# Patient Record
Sex: Male | Born: 1980 | Race: White | Hispanic: No | Marital: Single | Smoking: Current some day smoker
Health system: Southern US, Community
[De-identification: ages and names within clinical notes are randomized; demographics above are authoritative.]

## PROBLEM LIST (undated history)

## (undated) DIAGNOSIS — K7460 Unspecified cirrhosis of liver: Secondary | ICD-10-CM

## (undated) DIAGNOSIS — N179 Acute kidney failure, unspecified: Secondary | ICD-10-CM

## (undated) DIAGNOSIS — R011 Cardiac murmur, unspecified: Secondary | ICD-10-CM

## (undated) DIAGNOSIS — F1020 Alcohol dependence, uncomplicated: Secondary | ICD-10-CM

## (undated) DIAGNOSIS — F32A Depression, unspecified: Secondary | ICD-10-CM

## (undated) DIAGNOSIS — F1910 Other psychoactive substance abuse, uncomplicated: Secondary | ICD-10-CM

## (undated) DIAGNOSIS — R000 Tachycardia, unspecified: Secondary | ICD-10-CM

## (undated) DIAGNOSIS — F329 Major depressive disorder, single episode, unspecified: Secondary | ICD-10-CM

## (undated) DIAGNOSIS — K759 Inflammatory liver disease, unspecified: Secondary | ICD-10-CM

## (undated) DIAGNOSIS — F419 Anxiety disorder, unspecified: Secondary | ICD-10-CM

## (undated) DIAGNOSIS — I10 Essential (primary) hypertension: Secondary | ICD-10-CM

## (undated) DIAGNOSIS — D649 Anemia, unspecified: Secondary | ICD-10-CM

## (undated) HISTORY — DX: Depression, unspecified: F32.A

## (undated) HISTORY — DX: Major depressive disorder, single episode, unspecified: F32.9

## (undated) HISTORY — DX: Anxiety disorder, unspecified: F41.9

## (undated) HISTORY — PX: OTHER SURGICAL HISTORY: SHX169

## (undated) HISTORY — DX: Other psychoactive substance abuse, uncomplicated: F19.10

## (undated) HISTORY — DX: Cardiac murmur, unspecified: R01.1

## (undated) HISTORY — PX: COLONOSCOPY: SHX174

## (undated) HISTORY — DX: Anemia, unspecified: D64.9

## (undated) HISTORY — PX: FISSURECTOMY: SHX5244

## (undated) HISTORY — DX: Essential (primary) hypertension: I10

---

## 2012-01-02 ENCOUNTER — Emergency Department: Payer: Self-pay | Admitting: Emergency Medicine

## 2012-01-02 LAB — BASIC METABOLIC PANEL
Anion Gap: 12 (ref 7–16)
BUN: 9 mg/dL (ref 7–18)
Calcium, Total: 9.9 mg/dL (ref 8.5–10.1)
Co2: 29 mmol/L (ref 21–32)
Creatinine: 0.59 mg/dL — ABNORMAL LOW (ref 0.60–1.30)
EGFR (African American): >60
Osmolality: 265 (ref 275–301)
Sodium: 132 mmol/L — ABNORMAL LOW (ref 136–145)

## 2012-01-02 LAB — CBC
HCT: 50.1 % (ref 40.0–52.0)
MCH: 33.7 pg (ref 26.0–34.0)
MCHC: 34.6 g/dL (ref 32.0–36.0)
RBC: 5.14 10*6/uL (ref 4.40–5.90)
RDW: 13.8 % (ref 11.5–14.5)

## 2012-01-02 LAB — TROPONIN I: Troponin-I: <0.02 ng/mL

## 2013-02-01 ENCOUNTER — Inpatient Hospital Stay: Payer: Self-pay | Admitting: Specialist

## 2013-02-01 LAB — COMPREHENSIVE METABOLIC PANEL
Albumin: 3.1 g/dL — ABNORMAL LOW (ref 3.4–5.0)
Alkaline Phosphatase: 245 U/L — ABNORMAL HIGH (ref 50–136)
Anion Gap: 7 (ref 7–16)
BUN: 3 mg/dL — ABNORMAL LOW (ref 7–18)
Bilirubin,Total: 8.8 mg/dL — ABNORMAL HIGH (ref 0.2–1.0)
Calcium, Total: 8 mg/dL — ABNORMAL LOW (ref 8.5–10.1)
Co2: 31 mmol/L (ref 21–32)
EGFR (African American): >60
EGFR (Non-African Amer.): >60
Glucose: 152 mg/dL — ABNORMAL HIGH (ref 65–99)
Osmolality: 270 (ref 275–301)
Potassium: 3 mmol/L — ABNORMAL LOW (ref 3.5–5.1)
SGPT (ALT): 76 U/L (ref 12–78)
Sodium: 135 mmol/L — ABNORMAL LOW (ref 136–145)

## 2013-02-01 LAB — URINALYSIS, COMPLETE
Ketone: NEGATIVE
Leukocyte Esterase: NEGATIVE
Nitrite: NEGATIVE
Specific Gravity: 1.015 (ref 1.003–1.030)
WBC UR: 5 /HPF (ref 0–5)

## 2013-02-01 LAB — ETHANOL
Ethanol %: 0.427 % (ref 0.000–0.080)
Ethanol: 427 mg/dL

## 2013-02-01 LAB — CBC
HCT: 34 % — ABNORMAL LOW (ref 40.0–52.0)
MCHC: 35.2 g/dL (ref 32.0–36.0)
Platelet: 186 10*3/uL (ref 150–440)
RBC: 3.23 10*6/uL — ABNORMAL LOW (ref 4.40–5.90)
RDW: 17.8 % — ABNORMAL HIGH (ref 11.5–14.5)

## 2013-02-01 LAB — PROTIME-INR
INR: 1.3
Prothrombin Time: 16 secs — ABNORMAL HIGH (ref 11.5–14.7)

## 2013-02-01 LAB — APTT: Activated PTT: 40.8 secs — ABNORMAL HIGH (ref 23.6–35.9)

## 2013-02-01 LAB — TROPONIN I: Troponin-I: <0.02 ng/mL

## 2013-02-01 LAB — MAGNESIUM: Magnesium: 1.5 mg/dL — ABNORMAL LOW

## 2013-02-01 LAB — ACETAMINOPHEN LEVEL: Acetaminophen: <2 ug/mL

## 2013-02-02 LAB — CBC WITH DIFFERENTIAL/PLATELET
HGB: 10 g/dL — ABNORMAL LOW (ref 13.0–18.0)
Lymphocyte %: 15.8 %
MCH: 37.5 pg — ABNORMAL HIGH (ref 26.0–34.0)
MCHC: 35.5 g/dL (ref 32.0–36.0)
Monocyte #: 1.3 x10 3/mm — ABNORMAL HIGH (ref 0.2–1.0)
Monocyte %: 13.7 %
Neutrophil %: 69.3 %
WBC: 9.5 10*3/uL (ref 3.8–10.6)

## 2013-02-02 LAB — COMPREHENSIVE METABOLIC PANEL
Albumin: 2.6 g/dL — ABNORMAL LOW (ref 3.4–5.0)
Alkaline Phosphatase: 194 U/L — ABNORMAL HIGH (ref 50–136)
Anion Gap: 10 (ref 7–16)
Calcium, Total: 7.2 mg/dL — ABNORMAL LOW (ref 8.5–10.1)
Chloride: 98 mmol/L (ref 98–107)
Co2: 27 mmol/L (ref 21–32)
Creatinine: 0.41 mg/dL — ABNORMAL LOW (ref 0.60–1.30)
EGFR (African American): >60
Glucose: 146 mg/dL — ABNORMAL HIGH (ref 65–99)
Potassium: 2.9 mmol/L — ABNORMAL LOW (ref 3.5–5.1)
SGOT(AST): 266 U/L — ABNORMAL HIGH (ref 15–37)
SGPT (ALT): 63 U/L (ref 12–78)
Sodium: 135 mmol/L — ABNORMAL LOW (ref 136–145)

## 2013-02-02 LAB — TROPONIN I
Troponin-I: <0.02 ng/mL
Troponin-I: <0.02 ng/mL

## 2013-02-02 LAB — POTASSIUM: Potassium: 3.4 mmol/L — ABNORMAL LOW (ref 3.5–5.1)

## 2013-02-02 LAB — MAGNESIUM: Magnesium: 1 mg/dL — ABNORMAL LOW

## 2013-02-02 LAB — HEMOGLOBIN: HGB: 10.7 g/dL — ABNORMAL LOW (ref 13.0–18.0)

## 2013-02-03 LAB — CBC WITH DIFFERENTIAL/PLATELET
Basophil %: 0.7 %
Eosinophil #: 0.1 10*3/uL (ref 0.0–0.7)
Eosinophil %: 0.6 %
HCT: 29.2 % — ABNORMAL LOW (ref 40.0–52.0)
HGB: 10.2 g/dL — ABNORMAL LOW (ref 13.0–18.0)
Lymphocyte #: 1.1 10*3/uL (ref 1.0–3.6)
Lymphocyte %: 10.1 %
MCHC: 35 g/dL (ref 32.0–36.0)
MCV: 106 fL — ABNORMAL HIGH (ref 80–100)
Monocyte #: 1.4 x10 3/mm — ABNORMAL HIGH (ref 0.2–1.0)
Monocyte %: 12.1 %
Neutrophil #: 8.5 10*3/uL — ABNORMAL HIGH (ref 1.4–6.5)
RDW: 17.8 % — ABNORMAL HIGH (ref 11.5–14.5)
WBC: 11.1 10*3/uL — ABNORMAL HIGH (ref 3.8–10.6)

## 2013-02-03 LAB — CLOSTRIDIUM DIFFICILE BY PCR

## 2013-02-03 LAB — COMPREHENSIVE METABOLIC PANEL
Albumin: 2.5 g/dL — ABNORMAL LOW (ref 3.4–5.0)
Alkaline Phosphatase: 169 U/L — ABNORMAL HIGH (ref 50–136)
Calcium, Total: 7.1 mg/dL — ABNORMAL LOW (ref 8.5–10.1)
Chloride: 99 mmol/L (ref 98–107)
EGFR (African American): >60
EGFR (Non-African Amer.): >60
Glucose: 139 mg/dL — ABNORMAL HIGH (ref 65–99)
Osmolality: 261 (ref 275–301)
SGPT (ALT): 52 U/L (ref 12–78)

## 2013-02-03 LAB — AMMONIA: Ammonia, Plasma: 91 mcmol/L — ABNORMAL HIGH (ref 11–32)

## 2013-02-03 LAB — HEMOGLOBIN A1C: Hemoglobin A1C: <3.5 % — ABNORMAL LOW (ref 4.2–6.3)

## 2013-02-03 LAB — URINE CULTURE

## 2013-02-04 LAB — HEMOGLOBIN: HGB: 10.6 g/dL — ABNORMAL LOW (ref 13.0–18.0)

## 2013-02-04 LAB — BASIC METABOLIC PANEL
Calcium, Total: 7.2 mg/dL — ABNORMAL LOW (ref 8.5–10.1)
Creatinine: 0.55 mg/dL — ABNORMAL LOW (ref 0.60–1.30)
EGFR (Non-African Amer.): >60
Glucose: 134 mg/dL — ABNORMAL HIGH (ref 65–99)

## 2013-02-04 LAB — SODIUM: Sodium: 134 mmol/L — ABNORMAL LOW (ref 136–145)

## 2013-02-05 LAB — HEPATIC FUNCTION PANEL A (ARMC)
Albumin: 2.5 g/dL — ABNORMAL LOW (ref 3.4–5.0)
Alkaline Phosphatase: 151 U/L — ABNORMAL HIGH (ref 50–136)
Bilirubin, Direct: 12.8 mg/dL — ABNORMAL HIGH (ref 0.00–0.20)
Bilirubin,Total: 16.4 mg/dL — ABNORMAL HIGH (ref 0.2–1.0)
SGOT(AST): 143 U/L — ABNORMAL HIGH (ref 15–37)
SGPT (ALT): 42 U/L (ref 12–78)
Total Protein: 6.5 g/dL (ref 6.4–8.2)

## 2013-02-05 LAB — AMMONIA: Ammonia, Plasma: 57 mcmol/L — ABNORMAL HIGH (ref 11–32)

## 2013-02-05 LAB — PROTIME-INR: Prothrombin Time: 18.4 secs — ABNORMAL HIGH (ref 11.5–14.7)

## 2013-02-05 LAB — HEMOGLOBIN: HGB: 10.7 g/dL — ABNORMAL LOW (ref 13.0–18.0)

## 2013-02-05 LAB — SODIUM: Sodium: 134 mmol/L — ABNORMAL LOW (ref 136–145)

## 2013-02-05 LAB — STOOL CULTURE

## 2013-02-06 LAB — HEPATIC FUNCTION PANEL A (ARMC)
Albumin: 2.3 g/dL — ABNORMAL LOW (ref 3.4–5.0)
Alkaline Phosphatase: 143 U/L — ABNORMAL HIGH (ref 50–136)
Bilirubin, Direct: 12.4 mg/dL — ABNORMAL HIGH (ref 0.00–0.20)
SGOT(AST): 116 U/L — ABNORMAL HIGH (ref 15–37)

## 2013-02-06 LAB — SODIUM: Sodium: 134 mmol/L — ABNORMAL LOW (ref 136–145)

## 2013-02-07 LAB — HEMOGLOBIN: HGB: 10.4 g/dL — ABNORMAL LOW (ref 13.0–18.0)

## 2013-02-07 LAB — COMPREHENSIVE METABOLIC PANEL
Albumin: 2.5 g/dL — ABNORMAL LOW (ref 3.4–5.0)
Alkaline Phosphatase: 172 U/L — ABNORMAL HIGH (ref 50–136)
BUN: 11 mg/dL (ref 7–18)
Bilirubin,Total: 12.7 mg/dL — ABNORMAL HIGH (ref 0.2–1.0)
Chloride: 103 mmol/L (ref 98–107)
Creatinine: 0.43 mg/dL — ABNORMAL LOW (ref 0.60–1.30)
EGFR (African American): >60
EGFR (Non-African Amer.): >60
Glucose: 129 mg/dL — ABNORMAL HIGH (ref 65–99)
SGOT(AST): 157 U/L — ABNORMAL HIGH (ref 15–37)
SGPT (ALT): 48 U/L (ref 12–78)
Sodium: 134 mmol/L — ABNORMAL LOW (ref 136–145)

## 2013-02-07 LAB — PROTIME-INR
INR: 1.4
Prothrombin Time: 17.6 secs — ABNORMAL HIGH (ref 11.5–14.7)

## 2013-02-07 LAB — MAGNESIUM: Magnesium: 2.6 mg/dL — ABNORMAL HIGH

## 2013-02-08 LAB — CBC WITH DIFFERENTIAL/PLATELET
Basophil #: 0.1 10*3/uL (ref 0.0–0.1)
Basophil %: 0.4 %
Eosinophil #: 0 10*3/uL (ref 0.0–0.7)
Eosinophil %: 0.1 %
HCT: 29.8 % — ABNORMAL LOW (ref 40.0–52.0)
HGB: 10.2 g/dL — ABNORMAL LOW (ref 13.0–18.0)
Lymphocyte #: 1.2 10*3/uL (ref 1.0–3.6)
Lymphocyte %: 9.2 %
MCH: 37.6 pg — ABNORMAL HIGH (ref 26.0–34.0)
MCHC: 34.2 g/dL (ref 32.0–36.0)
MCV: 110 fL — ABNORMAL HIGH (ref 80–100)
Monocyte #: 1.5 x10 3/mm — ABNORMAL HIGH (ref 0.2–1.0)
Monocyte %: 11.6 %
Platelet: 205 10*3/uL (ref 150–440)
RBC: 2.71 10*6/uL — ABNORMAL LOW (ref 4.40–5.90)
RDW: 18.2 % — ABNORMAL HIGH (ref 11.5–14.5)

## 2013-02-08 LAB — COMPREHENSIVE METABOLIC PANEL
Albumin: 2.4 g/dL — ABNORMAL LOW (ref 3.4–5.0)
Alkaline Phosphatase: 151 U/L — ABNORMAL HIGH (ref 50–136)
Anion Gap: 6 — ABNORMAL LOW (ref 7–16)
BUN: 12 mg/dL (ref 7–18)
Bilirubin,Total: 9.2 mg/dL — ABNORMAL HIGH (ref 0.2–1.0)
Chloride: 104 mmol/L (ref 98–107)
Co2: 26 mmol/L (ref 21–32)
Creatinine: 0.59 mg/dL — ABNORMAL LOW (ref 0.60–1.30)
EGFR (African American): >60
Glucose: 122 mg/dL — ABNORMAL HIGH (ref 65–99)
SGPT (ALT): 49 U/L (ref 12–78)
Total Protein: 6.4 g/dL (ref 6.4–8.2)

## 2013-02-09 LAB — CBC WITH DIFFERENTIAL/PLATELET
Basophil #: 0.2 10*3/uL — ABNORMAL HIGH (ref 0.0–0.1)
MCH: 38.2 pg — ABNORMAL HIGH (ref 26.0–34.0)
MCHC: 34.9 g/dL (ref 32.0–36.0)
MCV: 110 fL — ABNORMAL HIGH (ref 80–100)
Monocyte #: 1.9 x10 3/mm — ABNORMAL HIGH (ref 0.2–1.0)
Monocyte %: 13.1 %
Neutrophil #: 10.6 10*3/uL — ABNORMAL HIGH (ref 1.4–6.5)
Neutrophil %: 73.6 %
Platelet: 214 10*3/uL (ref 150–440)
RBC: 2.82 10*6/uL — ABNORMAL LOW (ref 4.40–5.90)
RDW: 17.7 % — ABNORMAL HIGH (ref 11.5–14.5)

## 2013-02-09 LAB — COMPREHENSIVE METABOLIC PANEL
Alkaline Phosphatase: 145 U/L — ABNORMAL HIGH (ref 50–136)
Anion Gap: 5 — ABNORMAL LOW (ref 7–16)
Chloride: 104 mmol/L (ref 98–107)
Co2: 26 mmol/L (ref 21–32)
Creatinine: 0.49 mg/dL — ABNORMAL LOW (ref 0.60–1.30)
EGFR (Non-African Amer.): >60
Glucose: 85 mg/dL (ref 65–99)
Potassium: 4.6 mmol/L (ref 3.5–5.1)
SGPT (ALT): 53 U/L (ref 12–78)
Sodium: 135 mmol/L — ABNORMAL LOW (ref 136–145)
Total Protein: 6.5 g/dL (ref 6.4–8.2)

## 2013-02-10 LAB — COMPREHENSIVE METABOLIC PANEL
Albumin: 2.4 g/dL — ABNORMAL LOW (ref 3.4–5.0)
Alkaline Phosphatase: 141 U/L — ABNORMAL HIGH (ref 50–136)
Anion Gap: 4 — ABNORMAL LOW (ref 7–16)
BUN: 8 mg/dL (ref 7–18)
Bilirubin,Total: 10.4 mg/dL — ABNORMAL HIGH (ref 0.2–1.0)
Calcium, Total: 8.4 mg/dL — ABNORMAL LOW (ref 8.5–10.1)
Chloride: 103 mmol/L (ref 98–107)
EGFR (African American): >60
EGFR (Non-African Amer.): >60
Glucose: 92 mg/dL (ref 65–99)
Potassium: 4.2 mmol/L (ref 3.5–5.1)
SGOT(AST): 139 U/L — ABNORMAL HIGH (ref 15–37)
SGPT (ALT): 50 U/L (ref 12–78)
Total Protein: 6.5 g/dL (ref 6.4–8.2)

## 2013-02-10 LAB — MAGNESIUM: Magnesium: 1.4 mg/dL — ABNORMAL LOW

## 2013-02-10 LAB — CBC WITH DIFFERENTIAL/PLATELET
Basophil #: 0.1 10*3/uL (ref 0.0–0.1)
Basophil %: 0.7 %
Eosinophil #: 0.2 10*3/uL (ref 0.0–0.7)
Eosinophil %: 1.1 %
HGB: 10.6 g/dL — ABNORMAL LOW (ref 13.0–18.0)
MCH: 38.5 pg — ABNORMAL HIGH (ref 26.0–34.0)
Monocyte %: 12.1 %
Neutrophil #: 10.2 10*3/uL — ABNORMAL HIGH (ref 1.4–6.5)
Neutrophil %: 73.9 %
Platelet: 200 10*3/uL (ref 150–440)
RDW: 17.3 % — ABNORMAL HIGH (ref 11.5–14.5)
WBC: 13.8 10*3/uL — ABNORMAL HIGH (ref 3.8–10.6)

## 2013-02-11 LAB — CBC WITH DIFFERENTIAL/PLATELET
Basophil %: 0.5 %
Eosinophil #: 0 10*3/uL (ref 0.0–0.7)
HCT: 29.2 % — ABNORMAL LOW (ref 40.0–52.0)
HGB: 10.2 g/dL — ABNORMAL LOW (ref 13.0–18.0)
MCHC: 34.8 g/dL (ref 32.0–36.0)
MCV: 110 fL — ABNORMAL HIGH (ref 80–100)
Monocyte %: 6.4 %
Neutrophil #: 10.4 10*3/uL — ABNORMAL HIGH (ref 1.4–6.5)
Neutrophil %: 84.6 %
Platelet: 190 10*3/uL (ref 150–440)
RBC: 2.65 10*6/uL — ABNORMAL LOW (ref 4.40–5.90)
WBC: 12.3 10*3/uL — ABNORMAL HIGH (ref 3.8–10.6)

## 2013-02-11 LAB — COMPREHENSIVE METABOLIC PANEL
Alkaline Phosphatase: 129 U/L (ref 50–136)
Anion Gap: 5 — ABNORMAL LOW (ref 7–16)
Bilirubin,Total: 9.2 mg/dL — ABNORMAL HIGH (ref 0.2–1.0)
Calcium, Total: 8 mg/dL — ABNORMAL LOW (ref 8.5–10.1)
Co2: 28 mmol/L (ref 21–32)
Glucose: 165 mg/dL — ABNORMAL HIGH (ref 65–99)
Potassium: 4.7 mmol/L (ref 3.5–5.1)
SGPT (ALT): 44 U/L (ref 12–78)
Sodium: 137 mmol/L (ref 136–145)
Total Protein: 6.1 g/dL — ABNORMAL LOW (ref 6.4–8.2)

## 2013-02-11 LAB — CLOSTRIDIUM DIFFICILE BY PCR

## 2013-02-12 LAB — BASIC METABOLIC PANEL
Anion Gap: 6 — ABNORMAL LOW (ref 7–16)
BUN: 16 mg/dL (ref 7–18)
Calcium, Total: 8.3 mg/dL — ABNORMAL LOW (ref 8.5–10.1)
Chloride: 105 mmol/L (ref 98–107)
Co2: 27 mmol/L (ref 21–32)
Creatinine: 0.51 mg/dL — ABNORMAL LOW (ref 0.60–1.30)
EGFR (African American): >60
EGFR (Non-African Amer.): >60
Sodium: 138 mmol/L (ref 136–145)

## 2013-09-13 ENCOUNTER — Ambulatory Visit: Payer: Self-pay | Admitting: Internal Medicine

## 2013-09-20 ENCOUNTER — Ambulatory Visit: Payer: Self-pay | Admitting: Internal Medicine

## 2013-09-20 LAB — CBC CANCER CENTER
Basophil #: 0 x10 3/mm (ref 0.0–0.1)
Basophil %: 0.5 %
Eosinophil #: 0.1 x10 3/mm (ref 0.0–0.7)
Eosinophil %: 1.7 %
HCT: 36.8 % — AB (ref 40.0–52.0)
HGB: 12.1 g/dL — AB (ref 13.0–18.0)
LYMPHS PCT: 23.2 %
Lymphocyte #: 1.2 x10 3/mm (ref 1.0–3.6)
MCH: 30.1 pg (ref 26.0–34.0)
MCHC: 32.8 g/dL (ref 32.0–36.0)
MCV: 92 fL (ref 80–100)
Monocyte #: 0.5 x10 3/mm (ref 0.2–1.0)
Monocyte %: 9 %
NEUTROS PCT: 65.6 %
Neutrophil #: 3.5 x10 3/mm (ref 1.4–6.5)
Platelet: 97 x10 3/mm — ABNORMAL LOW (ref 150–440)
RBC: 4.01 10*6/uL — AB (ref 4.40–5.90)
RDW: 14 % (ref 11.5–14.5)
WBC: 5.3 x10 3/mm (ref 3.8–10.6)

## 2013-10-06 ENCOUNTER — Ambulatory Visit: Payer: Self-pay | Admitting: Internal Medicine

## 2014-05-04 ENCOUNTER — Emergency Department: Payer: Self-pay | Admitting: Emergency Medicine

## 2014-07-31 IMAGING — US ABDOMEN ULTRASOUND
1 series · 13 of 25 positions shown · non-contrast
Comparison: none

REASON FOR EXAM: upper abd painb, distention, jauntice, rectal bleeding
COMMENTS:   May transport without cardiac monitor

[Series 1: abdomen ultrasound · 0.40mm/px · 13 of 110 slices shown]
[im 1/110]
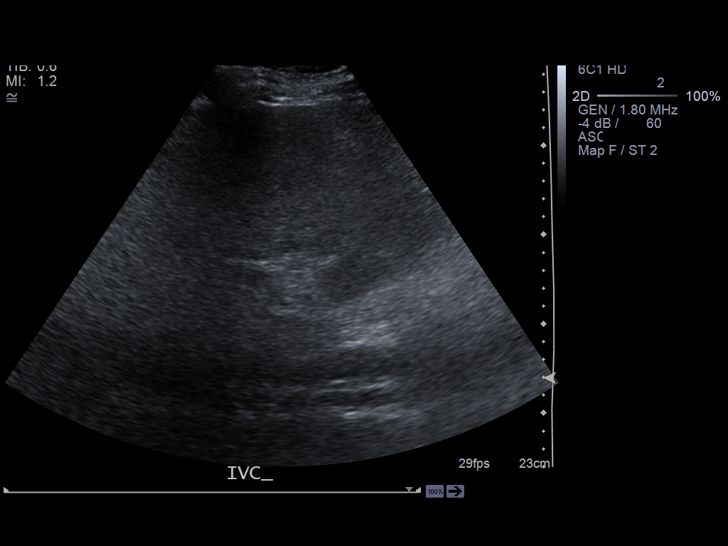
[im 10/110]
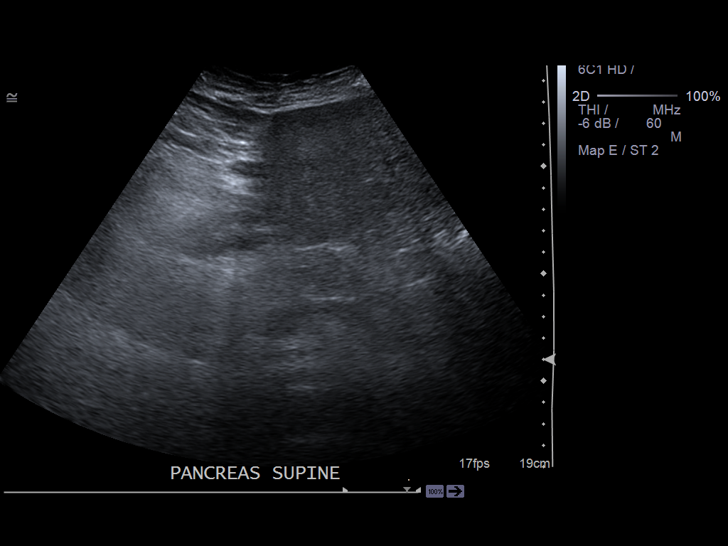
[im 19/110]
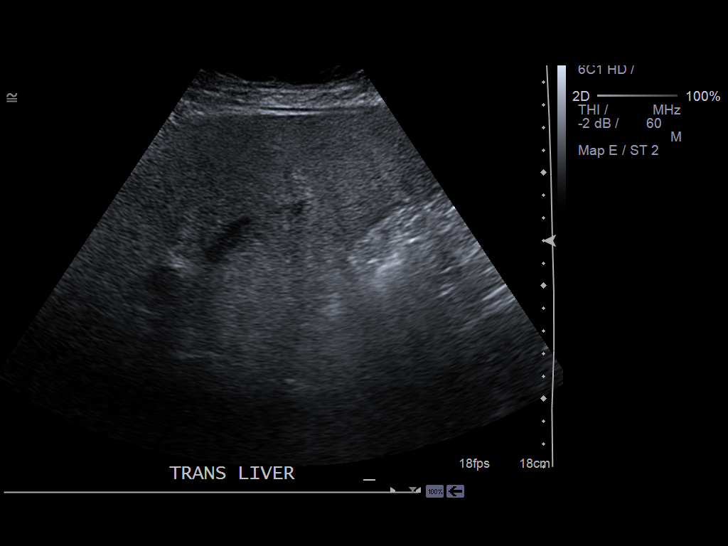
[im 28/110]
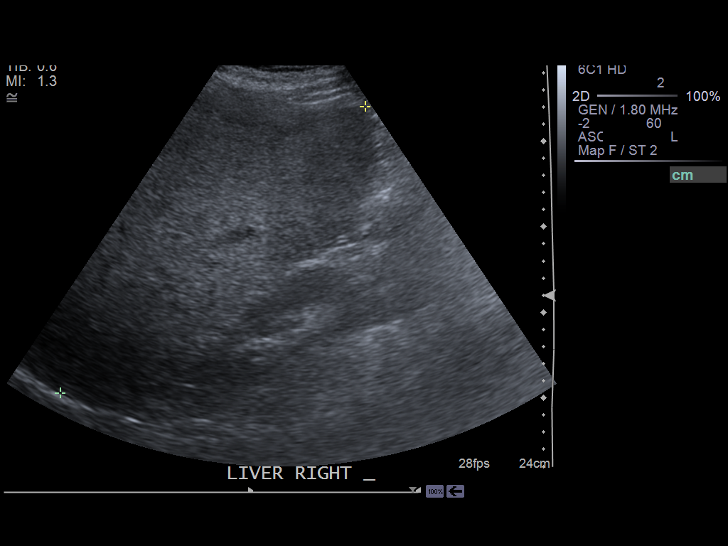
[im 37/110]
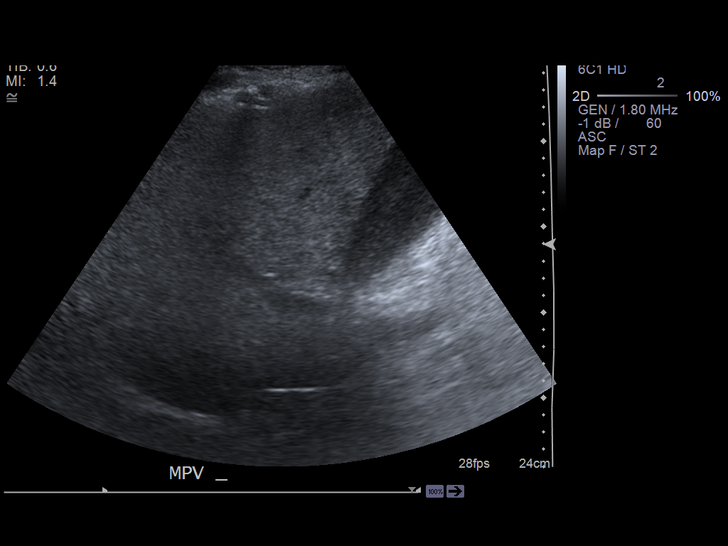
[im 46/110]
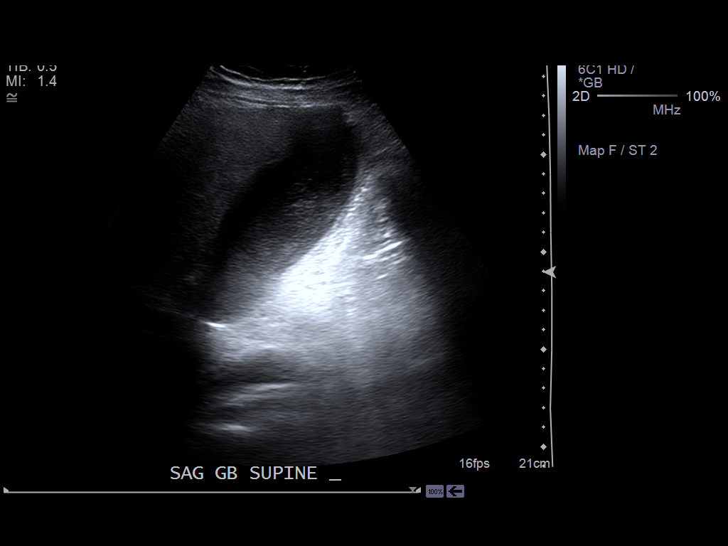
[im 55/110]
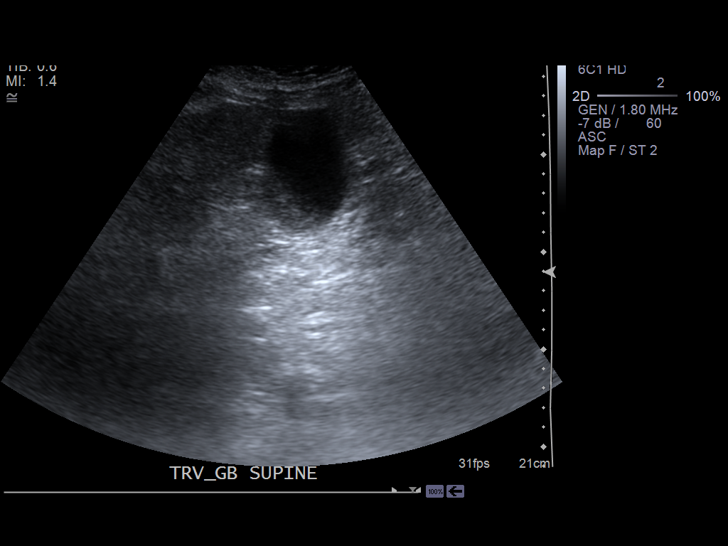
[im 64/110]
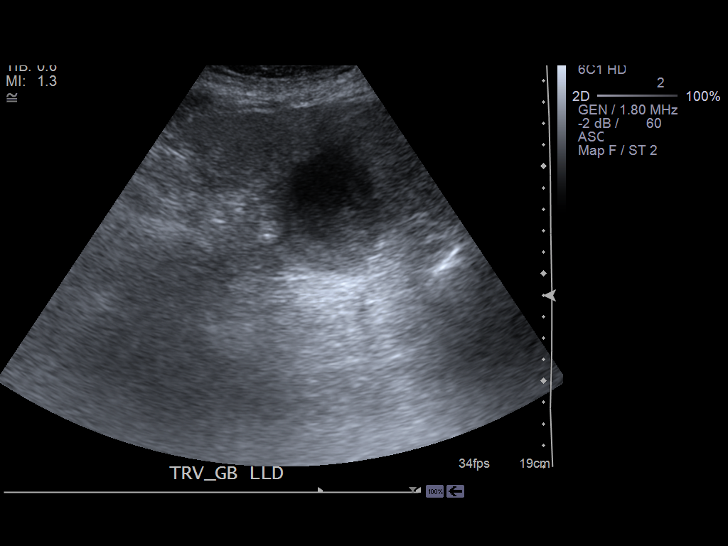
[im 73/110]
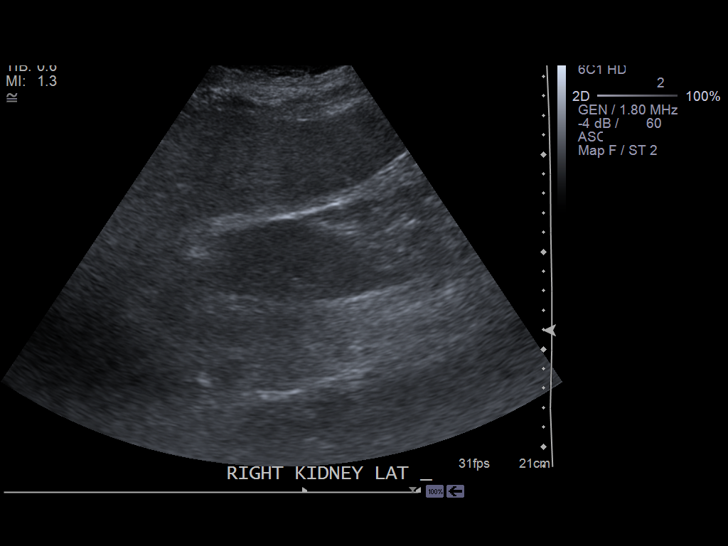
[im 82/110]
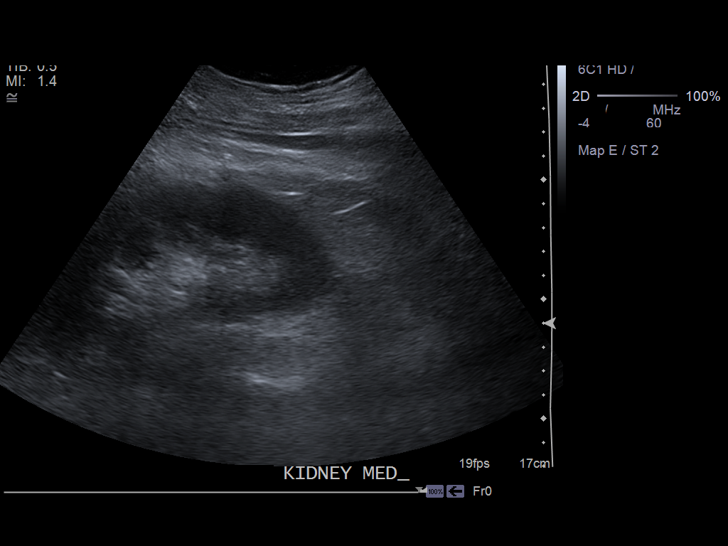
[im 91/110]
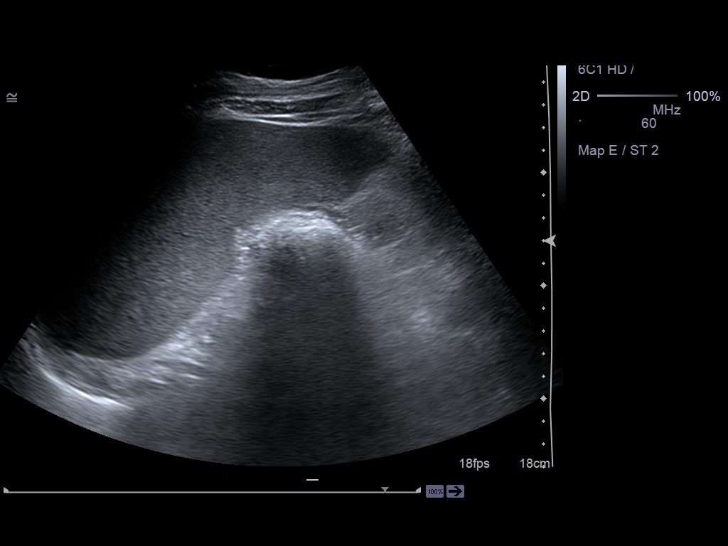
[im 100/110]
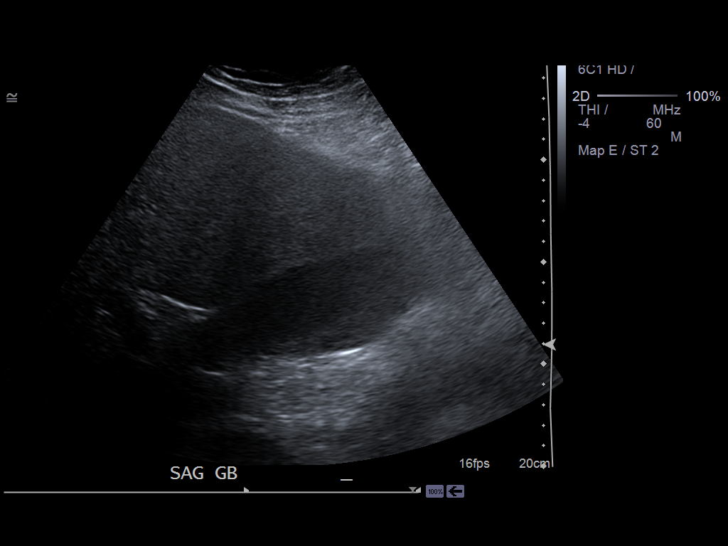
[im 110/110]
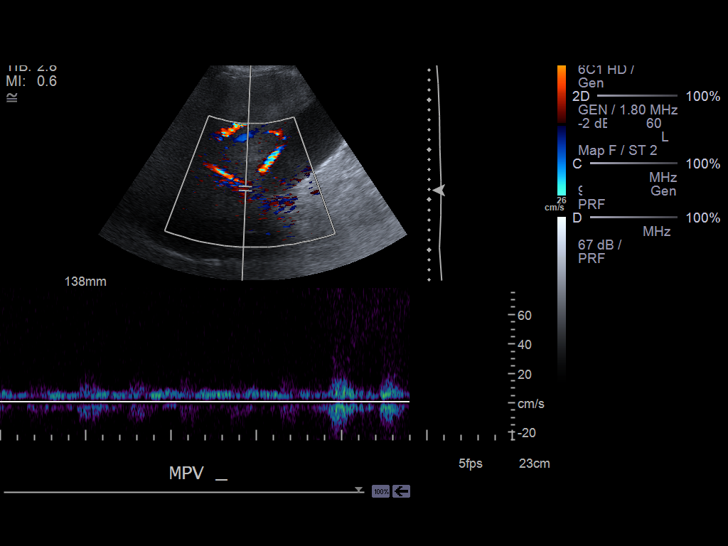

[13 of 25 positions shown; findings below may reference images not displayed]

PROCEDURE:     US  - US ABDOMEN GENERAL SURVEY  - February 01, 2013  [DATE]

RESULT:     Abdominal sonogram shows the pancreas and aorta are
predominantly and secured by overlying bowel gas and are poorly seen. The
liver shows a somewhat heterogeneous pattern of echotexture with mildly
increased echogenicity and is enlarged to 24.44 cm in length in the mid
axillary line. There is no intrahepatic biliary ductal dilation. Portal
venous flow appears to be grossly normal. The common bile duct diameter is
normal at 4.2 mm. The gallbladder contains some echogenic sludge with a
negative sonographic Murphy's sign the gallbladder wall thickness near the
upper limits of normal at 2.7 mm. The right kidney measures 13.05 x 4.73 x
5.19 cm. The left kidney is 12.77 x 6.18 x 8.19 cm. Neither kidney shows a
solid or cystic mass, obstructive change or nephrolithiasis. The spleen is
enlarged to 16.67 cm. No definite ascites is identified. No abnormal fluid
collection is evident.
IMPRESSION: 1. Hepatosplenomegaly.
2. Poor visualization of the pancreas and abdominal aorta. The inferior vena
cava is also not well seen. The there is sludge present in the gallbladder
with a negative sonographic Murphy's sign. The gallbladder wall thickness is
at the upper limits of normal.

[REDACTED]

## 2014-12-26 NOTE — Consult Note (Signed)
Chief Complaint:  Subjective/Chief Complaint Still with diarrhea and dyspepsia/nausea. Diet advanced to full liquids.   VITAL SIGNS/ANCILLARY NOTES: **Vital Signs.:   02-Jun-14 10:06  Vital Signs Type Q 4hr  Temperature Temperature (F) 99.4  Celsius 37.4  Temperature Source oral  Pulse Pulse 96  Respirations Respirations 18  Systolic BP Systolic BP 95  Diastolic BP (mmHg) Diastolic BP (mmHg) 58  Mean BP 70  Pulse Ox % Pulse Ox % 93  Pulse Ox Activity Level  At rest  Oxygen Delivery 2L   Brief Assessment:  GEN no acute distress   Cardiac Regular   Respiratory clear BS   Gastrointestinal Normal   Lab Results: Routine Chem:  02-Jun-14 09:01   Glucose, Serum  134  BUN  2  Creatinine (comp)  0.55  Sodium, Serum  133  Potassium, Serum 3.6  Chloride, Serum 101  CO2, Serum 27  Calcium (Total), Serum  7.2  Anion Gap  5  Osmolality (calc) 265  eGFR (African American) >60  eGFR (Non-African American) >60 (eGFR values <96m/min/1.73 m2 may be an indication of chronic kidney disease (CKD). Calculated eGFR is useful in patients with stable renal function. The eGFR calculation will not be reliable in acutely ill patients when serum creatinine is changing rapidly. It is not useful in  patients on dialysis. The eGFR calculation may not be applicable to patients at the low and high extremes of body sizes, pregnant women, and vegetarians.)   Assessment/Plan:  Assessment/Plan:  Assessment C.diff. Still symptomatic.   Plan No active bleeding. No signs of DT. Continue daily PPI and nadolol. Continue flagyl. Once c.diff sxs are under control, patient can be discharged to home with f/u in GI. thanks.   Electronic Signatures: OVerdie Shire(MD)  (Signed 02-Jun-14 13:07)  Authored: Chief Complaint, VITAL SIGNS/ANCILLARY NOTES, Brief Assessment, Lab Results, Assessment/Plan   Last Updated: 02-Jun-14 13:07 by OVerdie Shire(MD)

## 2014-12-26 NOTE — Consult Note (Signed)
Pt with CC of severe alcoholism, drinking a fifth of vodka a day, has alcohol pancreatitis, hepatitis.  Given these problems he has signif abd and lower extremity edema out of proportion to his albumin.  Wonder about obstruction to lymphphatics and would recommend CT of abd before discharge.  Echo cardiogram to check for alcoholic cardiomyopathy may be helpful even though he is pretty young.    Electronic Signatures: Scot JunElliott, Robert T (MD)  (Signed on 07-Jun-14 13:00)  Authored  Last Updated: 07-Jun-14 13:00 by Scot JunElliott, Robert T (MD)

## 2014-12-26 NOTE — Consult Note (Signed)
Chief Complaint:  Subjective/Chief Complaint Events of the weekend noted. Less dyspepsia, diarrhea, and rectal bleeding. Leg and stomach pain from edema. CT neg except for hepatosplenomegaly.   VITAL SIGNS/ANCILLARY NOTES: **Vital Signs.:   09-Jun-14 10:11  Vital Signs Type Q 4hr  Temperature Temperature (F) 98.2  Celsius 36.7  Temperature Source oral  Pulse Pulse 78  Respirations Respirations 18  Systolic BP Systolic BP 161  Diastolic BP (mmHg) Diastolic BP (mmHg) 77  Mean BP 91  Pulse Ox % Pulse Ox % 96  Pulse Ox Activity Level  At rest  Oxygen Delivery Room Air/ 21 %   Brief Assessment:  GEN no acute distress   Cardiac Regular   Respiratory clear BS   Gastrointestinal Normal   Lab Results: Hepatic:  09-Jun-14 05:41   Bilirubin, Total  9.2  Alkaline Phosphatase 129  SGPT (ALT) 44  SGOT (AST)  105  Total Protein, Serum  6.1  Albumin, Serum  2.3  Routine Chem:  09-Jun-14 05:41   Magnesium, Serum 2.0 (1.8-2.4 THERAPEUTIC RANGE: 4-7 mg/dL TOXIC: > 10 mg/dL  -----------------------)  Glucose, Serum  165  BUN 9  Creatinine (comp)  0.54  Sodium, Serum 137  Potassium, Serum 4.7  Chloride, Serum 104  CO2, Serum 28  Calcium (Total), Serum  8.0  Osmolality (calc) 276  eGFR (African American) >60  eGFR (Non-African American) >60 (eGFR values <41m/min/1.73 m2 may be an indication of chronic kidney disease (CKD). Calculated eGFR is useful in patients with stable renal function. The eGFR calculation will not be reliable in acutely ill patients when serum creatinine is changing rapidly. It is not useful in  patients on dialysis. The eGFR calculation may not be applicable to patients at the low and high extremes of body sizes, pregnant women, and vegetarians.)  Anion Gap  5  Routine Hem:  09-Jun-14 05:41   WBC (CBC)  12.3  RBC (CBC)  2.65  Hemoglobin (CBC)  10.2  Hematocrit (CBC)  29.2  Platelet Count (CBC) 190  MCV  110  MCH  38.3  MCHC 34.8  RDW  16.9   Neutrophil % 84.6  Lymphocyte % 8.5  Monocyte % 6.4  Eosinophil % 0.0  Basophil % 0.5  Neutrophil #  10.4  Lymphocyte # 1.0  Monocyte # 0.8  Eosinophil # 0.0  Basophil # 0.1 (Result(s) reported on 11 Feb 2013 at 0[facility])   Radiology Results: CT:    08-Jun-14 13:16, CT Abdomen and Pelvis With Contrast  CT Abdomen and Pelvis With Contrast   REASON FOR EXAM:    (1) Lymph nodes- no need for oral contrast.; (2)   lymph nodes- chronic worsening  COMMENTS:       PROCEDURE: CT  - CT ABDOMEN / PELVIS  W  - Feb 10 2013  1:16PM     RESULT: History: Lymphadenopathy, GI bleed    Comparison:  None    Technique: Multiple axial images of the abdomen and pelvis were performed   from the lung bases to the pubic symphysis, without p.o. contrast and   with 100 ml of Isovue 370 intravenous contrast.    Findings:  The lung bases are clear. There is no pneumothorax. The heart size is   normal.     The liver is enlarged without focal abnormality. There is no intrahepatic   or extrahepatic biliary ductal dilatation. The gallbladder is   unremarkable. The spleen is enlarged without focal abnormality. The   kidneys, adrenal glands, and pancreas are normal.  The bladder is   unremarkable.     The unopacified stomach, duodenum, small intestine, and large intestine   demonstrate no gross abnormality, but evaluation is she limited secondary   to lack of enteric contrast. There is sigmoid diverticulosis without   evidence of diverticulitis. There is no pneumoperitoneum, pneumatosis, or   portal venous gas. There is a small amount of pelvic free fluid. There is   no lymphadenopathy. There is anasarca.  The abdominal aorta is normal in caliber .    The osseous structures are unremarkable.    IMPRESSION:     1. No evidence of abdominal or pelvic lymphadenopathy.    2. Hepatosplenomegaly.    Dictation Site: 1        Verified By: Jennette Banker, M.D., MD   Assessment/Plan:   Assessment/Plan:  Assessment Alcoholic hepatitis. C.diff.   Plan Continue to taper off prednisone over next few weeks. Recheck stool for c.diff. If neg, can prob stop by tomorrow.(tomorrow will be day #10 of flagyl) Contine xifaxan, pain meds prn. Daily diuretics. May need to increase dose further if no signif improvement. Hopefully, dishcharge soon. I will be at [facility] tomorrow. Will check back on Wed if patient still here. Thanks.   Electronic Signatures: Verdie Shire (MD)  (Signed 09-Jun-14 13:18)  Authored: Chief Complaint, VITAL SIGNS/ANCILLARY NOTES, Brief Assessment, Lab Results, Radiology Results, Assessment/Plan   Last Updated: 09-Jun-14 13:18 by Verdie Shire (MD)

## 2014-12-26 NOTE — H&P (Signed)
PATIENT NAME:  Derrick Blanchard, Derrick Blanchard MR#:  [id number] DATE OF BIRTH:  12/03/1980  DATE OF ADMISSION:  02/01/2013  REFERRING PHYSICIAN:  Dr. Dorothea GlassmanPaul Malinda.   REASON FOR ADMISSION:  Severe gastrointestinal bleeding and acute alcoholic hepatitis.   PRIMARY CARE PHYSICIAN:  None.   HISTORY OF PRESENT ILLNESS:  A 34 year old gentleman with history of severe alcoholism who comes with a history of intermittent GI bleeding per rectum that happens he says very often.   The patient at this moment is intoxicated with alcohol.  He is able to give some information.  His parents are there.  Apparently this morning and for the past 3 or 4 bowel movements he has had significant amount of red blood per rectum coming out.  He developed jaundice within the past three days with significant changes of coloration of his eyes and mucosa.    The patient has not been feeling very well.  He feels really weak, sick and continues to drink to alleviate all of the symptoms.  The family has asked him to come to the hospital on several occasions within the last week and he has refused up until today.    The patient states that he has been drinking about a fifth of alcohol a day and he has been drinking this heavily for the past two days.  He has gone into DTs, but he has never had seizures.  He is developing significant edema of the lower extremities and he has occasional tightness of his chest and shortness of breath due to this.  He has occasional nosebleeds.  The patient is evaluated in the ER.  His bilirubin is 8.8.  His previous hemoglobin on April of 2013, about a year ago, was 1917.  Tonight it is 11.9.    He has elevated white blood count.  He has significant increase of the AST and alkaline phosphatase and his alcohol level is 0.4.    The patient admitted for evaluation of these symptoms.  I had a long conversation with this patient about counseling with alcohol.  This is a serious condition that could kill him tonight and  he really needs to open his eyes and quit drinking.   REVIEW OF SYSTEMS:  A 12 system review of systems:  CONSTITUTIONAL:  The patient denies any fever, but he felt hot this morning.  He did not know if he was fever or not.  He feels fatigued.  He feels weak.  He had significant weight gain with fluid.   EYES:  Denies any blurry vision.  He just have changes of coloration with yellow sclerae.  EARS, NOSE, THROAT:  No difficulty swallowing.  No hearing loss.  No ear pain.  RESPIRATORY:  Occasional shortness of breath.  No cough.  No wheezing.  No COPD.  No hemoptysis.  CARDIOVASCULAR:  No chest pain, orthopnea.  Positive edema.  Negative syncope.  GASTROINTESTINAL:  Positive nausea.  No vomiting.  Negative diarrhea.  Positive bright red blood per rectum.  Negative melena.  Positive jaundice.  GENITOURINARY:  No dysuria, hematuria or changes in frequency.  No prostatitis.  ENDOCRINE:  No polyuria, polydipsia, polyphagia, cold or heat intolerance.  HEMATOLOGIC AND LYMPHATIC:  No history of anemia.  Positive easy bruising, positive GI bleeding.  No swollen glands.  SKIN:  Positive jaundice.  Positive telangiectasis on the belly.  MUSCULOSKELETAL:  No significant neck pain, back pain, shoulder pain or gout.  NEUROLOGIC:  No numbness, weakness, ataxia, CVA or TIA.  PSYCHIATRIC:  The patient might be depressed, but he states at this moment he just feels a little bit overwhelmed.  No anxiety or agitation.   PAST MEDICAL HISTORY:  Alcohol abuse.  In the past he has been told that he has elevated blood pressure, but never treated.   ALLERGIES:  Not known drug allergies.   PAST SURGICAL HISTORY:  No surgical history.   MEDICATIONS:  He does not take any medications.   SOCIAL HISTORY:  The patient is a heavy drinker of a fifth of alcohol a day for the past couple of years.  He has been drinking about daily.  He has history of marijuana use occasional, but he does not smoke tobacco.  He is unemployed.   He occasionally picks up a job here and there.  He lives with his parents.  He denies having any STDs.  No current sexual partners.  He cannot tell me how many he has had in his past.  One time three years ago he had use of hypodermic needle for IV drug abuse, but only once.   FAMILY HISTORY:  No MI.  Positive diabetes in his dad.  No cancer.   PHYSICAL EXAMINATION: VITAL SIGNS:  Blood pressure 129/70, pulse 111, respirations 24, temperature 98, oxygen saturation 95% on room air.   GENERAL:  The patient is alert.  He is oriented x 3.  No acute distress.  He looks very ill.  He is jaundiced.  He looks swollen all over, anasarca.  HEENT:  Pupils are equal and reactive.  Extraocular movements intact.  Icteric sclerae, profound yellow color.  No oral lesions.  No oropharyngeal exudates.  Mucosa is dry.  NECK:  Supple.  No JVD.  No thyromegaly.  No adenopathy.  No carotid bruits.  No rigidity.  CARDIOVASCULAR:  Regular rate and rhythm.  No murmurs, rubs or gallops.  Positive tachycardia.  No tenderness to palpation anterior chest wall.  No displacement of PMI.  LUNGS:  Clear with decreased respiratory sounds bilaterally in bases.  No wheezing.  No crackles.  No use of accessory muscles.  ABDOMEN:  Is distended, enlarged, with some subcutaneous edema.  Positive telangiectasis of the skin.  Distended, nontender to palpation.  No rebound.  Bowel sounds are distal.  GENITAL:  Deferred.  EXTREMITIES:  Positive edema +2.  Capillary refill about 5 seconds.  No cyanosis or clubbing.  MUSCULOSKELETAL:  No significant joint deformities.  No joint effusions.  SKIN:  Positive pallor and icterus.  No rashes or petechiae.  Positive telangiectasis.  LYMPHATICS:  Negative for lymphadenopathy in neck or supraclavicular areas.  VASCULAR:  Good pulses, capillary refill about 5 seconds.  NEUROLOGIC:  Cranial nerves II through XII intact.  His strength is 5 out of 5 in four extremities.  Sensation is normal distally.  No  focal findings.  PSYCHIATRIC:  No significant agitation.  The patient is mildly intoxicated and he has occasional shaking of the upper extremities due to beginning of DTs very likely.   LABORATORY, DIAGNOSTIC AND RADIOLOGICAL DATA:  Abdomen as mentioned above, there is no ascites.  There is hepatosplenomegaly.  There is poor visualization of the pancreas and aorta, inferior vena cava.  There is some sludge present in the gallbladder.  Negative sonographic Murphy sign.  Gallbladder wall thickness is in upper limits of normal.  No Murphy sign cardiologically.  Glucose 152, creatinine 0.4, sodium 135, potassium 3.0.  Calcium is 8.0.  Other electrolytes within normal limits.  CO2 is 31.  No  signs of acidosis.  His alcohol level is 427.  His ethanol percentage is 0.427, total protein 8.0, albumin 3.1, bilirubin 8.8, alkaline phosphatase 245, AST 323, ALT 76, troponin 0.02.  White blood count is 16.8, hemoglobin is 11.9.  Previous hemoglobin was 17.3.  Platelets are 186.  Previously platelets were 83.  PT is 16.  PTT 40.  INR is 1.3.  UA has 100 mg/dL protein, bacteria +1, white count is 5, leukocyte esterase and nitrate is negative.  Acetaminophen level in serum is 2.  EKG, sinus tachycardia.  No ST depression or elevation on the previous EKG.  X-ray, no signs of pneumonia or infiltrate.  There might be some nodularities on the right middle lung.  Positive pulmonary nodules.  I do not have a full report from the radiologist.   ASSESSMENT AND PLAN:  A 34 year old gentleman with severe alcohol problem who comes with a history of jaundice for about three days and now with acute bright red blood per rectum, significant amounts.  The patient evaluated in the ER.  His hemoglobin has dropped about 6 points since the previous exam a year ago.  He has significant increase in his bilirubin of 8.8 and he is on acute alcoholic hepatitis.  1.  Bright red blood per rectum.  The patient has significant gastrointestinal bleeding.   I am going to order a bleeding scan for the morning, although at this moment the bleeding could just come from significant portal hypertension.  The patient has hepatosplenomegaly which is a main sign of portal hypertension.  For all that, the patient is going to be admitted to the Critical Care Unit.  Hemoglobin is going to be checked every 4 hours.  Blood is hold 4 units, but the patient will have a transfusion of 2 units if he becomes hemodynamically unstable, if he has any significant drop of hemoglobin of more than 2 points within 4 hours or if he has hemoglobin below 7 at any point.  Also if he is having significant signs of hypovolemic shock, like a drop of his blood pressure if systolic is below 100, physician needs to be notified.  The patient has an INR of 1.3 and a PT of 16.  He has beginning of coagulopathy, but no need of emergent fresh frozen plasma.  If the patient requires massive transfusions he will have fresh frozen plasma and calcium per protocol.  Dr. Bluford Kaufmann has been notified about this patient and he recommended octreotide and a Protonix drip that is going to be continued.  2.  Acute alcoholic hepatitis.  The patient has a bilirubin of 8.8 and jaundice.  His AST is 323.  He has Maddrey discrimination factor of 27.  It is not necessarily qualifying for initiation of steroids, but we are going to check with GI doctor to see if he would like to start steroids at any point.  He does not have any ascites, but he has signs of portal hypertension including gastrointestinal bleeding, including hepatosplenomegaly and telangiectasis for what I am going to start him on nadolol and spironolactone.  3.  Hypokalemia.  Replace with potassium.  4.  Acute alcohol intoxication, at high risk of delirium tremens, put him on CIWA protocol.  5.  Elevated white blood count, likely due to alcoholic hepatitis, although the patient is really sick with GI bleeding, could have diverticulitis for what I am going to start  him on empiric treatment with Zosyn.  GI scan is going to be ordered for tomorrow.  At this moment, I do not think there is an imminent need to get a CT scan, but findings on the ultrasound show sludge and thickening of the gallbladder just at the upper limits of normal for what the possibility of the patient having cholecystitis is still there.  I am going to let him cool off tonight on the unit and tomorrow consider the possibility of do a CT scan if the patient is stable.  Psychiatric evaluation needs to be done at some point before sending the patient home.  I am not going to order it tonight as the patient is unstable.  The patient is admitted to the Critical Care Unit.   TIME SPENT:  I spent about 80 minutes with this admission.  Critical care time as the patient is at high risk of death even tonight from the gastrointestinal bleeding and liver failure.    ____________________________ Felipa Furnace, MD rsg:ea D: 02/01/2013 22:09:35 ET T: 02/01/2013 23:06:22 ET JOB#: [id number]  cc: Felipa Furnace, MD, <Dictator> Rickita Forstner Juanda Chance MD ELECTRONICALLY SIGNED 02/11/2013 1:56

## 2014-12-26 NOTE — Consult Note (Signed)
Chief Complaint:  Subjective/Chief Complaint Slow improvement. Duretics increased but still edematous. Dyspepsia mild and min bleeding persists. LFT declining.   VITAL SIGNS/ANCILLARY NOTES: **Vital Signs.:   06-Jun-14 10:17  Vital Signs Type Q 4hr  Temperature Temperature (F) 97.9  Celsius 36.6  Temperature Source oral  Pulse Pulse 96  Respirations Respirations 21  Systolic BP Systolic BP 902  Diastolic BP (mmHg) Diastolic BP (mmHg) 79  Mean BP 93  Pulse Ox % Pulse Ox % 96  Pulse Ox Activity Level  At rest  Oxygen Delivery Room Air/ 21 %   Brief Assessment:  GEN no acute distress   Cardiac Regular   Respiratory clear BS   Gastrointestinal mild u pper abd tenderness   Additional Physical Exam Less jaundiced.   Lab Results: Hepatic:  06-Jun-14 05:45   Bilirubin, Total  9.2  Alkaline Phosphatase  151  SGPT (ALT) 49  SGOT (AST)  142  Total Protein, Serum 6.4  Albumin, Serum  2.4  Routine Chem:  06-Jun-14 05:45   Glucose, Serum  122  BUN 12  Creatinine (comp)  0.59  Sodium, Serum 136  Potassium, Serum 4.5  Chloride, Serum 104  CO2, Serum 26  Calcium (Total), Serum  8.1  Osmolality (calc) 273  eGFR (African American) >60  eGFR (Non-African American) >60 (eGFR values <22m/min/1.73 m2 may be an indication of chronic kidney disease (CKD). Calculated eGFR is useful in patients with stable renal function. The eGFR calculation will not be reliable in acutely ill patients when serum creatinine is changing rapidly. It is not useful in  patients on dialysis. The eGFR calculation may not be applicable to patients at the low and high extremes of body sizes, pregnant women, and vegetarians.)  Anion Gap  6  Magnesium, Serum  1.6 (1.8-2.4 THERAPEUTIC RANGE: 4-7 mg/dL TOXIC: > 10 mg/dL  -----------------------)  Routine Hem:  06-Jun-14 05:45   WBC (CBC)  13.4  RBC (CBC)  2.71  Hemoglobin (CBC)  10.2  Hematocrit (CBC)  29.8  Platelet Count (CBC) 205  MCV  110   MCH  37.6  MCHC 34.2  RDW  18.2  Neutrophil % 78.7  Lymphocyte % 9.2  Monocyte % 11.6  Eosinophil % 0.1  Basophil % 0.4  Neutrophil #  10.5  Lymphocyte # 1.2  Monocyte #  1.5  Eosinophil # 0.0  Basophil # 0.1 (Result(s) reported on 08 Feb 2013 at 0[facility])   Assessment/Plan:  Assessment/Plan:  Assessment Alcoholic hepatitis. On solumedrol. Ascites. On aldactone/lasix. C.diff. On flagyl.   Plan Advance diet as tolerated. Continue current meds. Check daily wt. Dr. EVira Agarto see over the weekend. Thanks.   Electronic Signatures: OVerdie Shire(MD)  (Signed 06-Jun-14 12:05)  Authored: Chief Complaint, VITAL SIGNS/ANCILLARY NOTES, Brief Assessment, Lab Results, Assessment/Plan   Last Updated: 06-Jun-14 12:05 by OVerdie Shire(MD)

## 2014-12-26 NOTE — Discharge Summary (Signed)
PATIENT NAME:  Derrick Blanchard, Derrick Blanchard MR#:  [id number] DATE OF BIRTH:  Feb 22, 1981  DATE OF ADMISSION:  02/01/2013 DATE OF DISCHARGE:  02/12/2013  For a detailed note, please take a look at the history and physical on admission done by Dr. Mordecai Maes on 02/01/2013. Please also take a look at the interim discharge summary done by Dr. Elisabeth Pigeon , which covers hospital course from May 30 until June 8.  This is interim hospital course from June 9 and June 10 upon discharge.    DISCHARGE DIAGNOSES: Is as follows:   1.  Acute alcoholic hepatitis, much improved.  2.  History of heavy alcohol abuse.  3. C. difficile colitis, now resolved.  4.  Hepatic encephalopathy.  5.  Acute posthemorrhagic anemia.  5.  Severe lower extremity edema and anasarca secondary to liver disease.   DIET: The patient is being discharged on a low-sodium diet.   ACTIVITY: As tolerated.   FOLLOW-UP: [facility] in the next 1 to 2 days.   DISCHARGE MEDICATIONS: Prednisone taper starting at 30 mg down to 10 mg over the next 3 days, oxycodone 5 mg q. 4 hours as needed, Lasix 40 mg b.i.d., Aldactone 25 mg daily and lactulose 15 mL b.i.d.   CONSULTANTS DURING HOSPITAL COURSE: Dr. Lutricia Feil from gastroenterology.   PERTINENT STUDIES DONE DURING THE HOSPITAL COURSE: Are as follows: A CT scan of the abdomen and pelvis done with contrast on June 8showing  hepatosplenomegaly. No evidence of any abdominal or pelvic lymphadenopathy.   An ultrasound of the abdomen done on June 4 showing sludge within the otherwise normal appearing gallbladder common bile duct is 4.1 mm in diameter, a small amount of fluid in the right upper quadrant, fatty infiltration of the liver. A nuclear medicine GI bleeding scan done June 4, showing no evidence of any acute GI bleed.   HOSPITAL COURSE: This is a 34 year old male with multiple medical problems as mentioned above, presented initially to the hospital due to lower gastrointestinal bleeding and also  severely elevated liver function tests and noted to have acute alcoholic hepatitis.  1.  Acute gastrointestinal bleeding. This has now resolved. The likely source of bleeding was thought to be related to Clostridium difficile. His initial bleeding scan on admission  June 4 was negative. The patient has been maintained on a proton pump inhibitor throughout the hospital course, although he does not need one presently. His hemoglobin has remained stable since then and he has had no further evidence of bleeding in the past few days.  2.  Acute posthemorrhagic anemia. This was secondary to rectal bleeding. As mentioned, the patient's hemoglobin has remained stable in the past 48 hours and he has not required any transfusions.  3.  Alcoholic hepatitis. The patient presented to the hospital with bilirubin as high as 16, it is  now down in the 2s.  His jaundice has significantly improved. His LFTs are also significantly improved. He was maintained on some steroids. He is currently being discharged on a prednisone taper as mentioned.  4.  Hepatic encephalopathy. Clinically, the patient's has not been encephalopathic in the past 48 to 72 hours. He is being discharged on some oral lactulose.  5.  C. difficile colitis. The patient's diarrhea has significantly improved. He has been treated for 10 days with oral Flagyl. His repeat C. difficile toxin has been negative and this has  currently resolved.  6.  Lower extremity edema and anasarca. This is secondary to his chronic liver disease.  At present, the patient is being discharged on some Lasix, Aldactone and follow up as an outpatient. The patient likely needs to abstain from alcohol, which he was advised to do so. He is being referred to a tertiary care center by Dr. Nigel Bridgemanh's office and they will contact him once they [facility].   CODE STATUS:  The patient is a full code.   Time Spent ON discharge: 45 minutes    ____________________________ Rolly PancakeVivek J. Cherlynn KaiserSainani, MD vjs:cc D: 02/12/2013 16:42:28 ET T: 02/12/2013 20:31:00 ET JOB#: [id number]  cc: Rolly PancakeVivek J. Cherlynn KaiserSainani, MD, <Dictator> [facility] [place] SirenVIVEK J Matthias Bogus MD ELECTRONICALLY SIGNED 02/22/2013 20:36

## 2014-12-26 NOTE — Consult Note (Signed)
Pt seen and examined. Full consult to follow. Admitted with acute GI bleeding, alcoholic intoxication and alcoholic hepatitis. Hx suggests more LGI bleeding than UGI. Prob has portal HTN. Ok with starting nadolol. Can prob wean off octreotide infusion by tomorrow unless patient vomits blood. Moniter LFT and CBC. Sl asterixis. Check ammonia level. Agree with PPI. Needs DT prophylaxis. If further bleeding, then will plan EGD/colon on Monday.   Electronic Signatures: Lutricia Feilh, Dollye Glasser (MD)  (Signed on 31-May-14 08:53)  Authored  Last Updated: 31-May-14 08:53 by Lutricia Feilh, Oz Gammel (MD)

## 2014-12-26 NOTE — Consult Note (Signed)
CC: alcoholism.  Alcohol hepatitis, elevated ammonia level on admit.  TB up to 10.4, VSS afeb.  I discussed with him the serious nature of his abuse of alsohol and that he surely would not live to see 34 years old unless he stopped alcohol immediately.  Discussed case with Dr. Imogene Burnhen.  His edema is very impressive and I wonder about mechanical problems with lymphatic return or with right side heart disease.  Electronic Signatures: Scot JunElliott, Leather Estis T (MD)  (Signed on 08-Jun-14 10:09)  Authored  Last Updated: 08-Jun-14 10:09 by Scot JunElliott, Alysen Smylie T (MD)

## 2014-12-26 NOTE — Consult Note (Signed)
Chief Complaint:  Subjective/Chief Complaint Anxious and agitated. C/O back pain. Less dyspepsia. Some rectal bleeding earlier. LFT sl improved from yest. C/O swollen feet.   VITAL SIGNS/ANCILLARY NOTES: **Vital Signs.:   04-Jun-14 14:43  Vital Signs Type Routine  Temperature Temperature (F) 98.3  Celsius 36.8  Temperature Source oral  Pulse Pulse 97  Respirations Respirations 20  Systolic BP Systolic BP 108  Diastolic BP (mmHg) Diastolic BP (mmHg) 59  Mean BP 75  Pulse Ox % Pulse Ox % 99  Pulse Ox Activity Level  At rest  Oxygen Delivery 3L   Brief Assessment:  GEN no acute distress   Cardiac Regular   Respiratory clear BS   Gastrointestinal Normal   Additional Physical Exam Jaundiced.   Lab Results: Hepatic:  04-Jun-14 06:16   Bilirubin, Total  15.6  Bilirubin, Direct  12.40 (Result(s) reported on 06 Feb 2013 at 07:05AM.)  Alkaline Phosphatase  143  SGPT (ALT) 36  SGOT (AST)  116  Total Protein, Serum  6.1  Albumin, Serum  2.3  Routine Chem:  04-Jun-14 06:16   Sodium, Serum  134 (Result(s) reported on 06 Feb 2013 at 06:53AM.)  Potassium, Serum 4.0 (Result(s) reported on 06 Feb 2013 at 06:53AM.)  Routine Hem:  04-Jun-14 06:16   Hemoglobin (CBC)  9.9 (Result(s) reported on 06 Feb 2013 at 06:56AM.)   Assessment/Plan:  Assessment/Plan:  Assessment Alcoholic hepatitis. EtOH withdrawal. C.diff.   Plan Continue flagyl. Continue solumedrol. Start diuretics.   Electronic Signatures: Lutricia Feilh, Kaylinn Dedic (MD)  (Signed 04-Jun-14 15:36)  Authored: Chief Complaint, VITAL SIGNS/ANCILLARY NOTES, Brief Assessment, Lab Results, Assessment/Plan   Last Updated: 04-Jun-14 15:36 by Lutricia Feilh, Shanesha Bednarz (MD)

## 2014-12-26 NOTE — Consult Note (Signed)
PATIENT NAME:  Derrick Blanchard, Derrick Blanchard MR#:  [id number] DATE OF BIRTH:  01/01/1981  DATE OF CONSULTATION:  02/02/2013  CONSULTING PHYSICIAN:  Lupita Dawn. Asheley Hellberg, MD  REASON FOR REFERRAL: Alcoholic hepatitis and acute GI bleeding.   DESCRIPTION: The patient is a 34 year old white male who is a heavy alcohol drinker who presents with gross rectal bleeding on and off at least for the past several months, but more acute on the day of admission. The patient was brought into the Emergency Room for evaluation and was found to be intoxicated with alcohol. He has been having rectal bleeding with worsening jaundice. The patient is weak and shaky, but denies any abdominal pain or cramping or nausea, vomiting, or heartburn/indigestion symptoms. The patient has had withdrawal symptoms from alcohol and the past. The patient denies any blackouts or history of pancreatitis from alcohol intoxication. Apparently he drinks at least a fifth of alcohol daily. He has had some swelling in his legs as well. A year ago his hemoglobin was 17. Today it is only 11.9. As a result, the patient was admitted for further evaluation.   REVIEW OF SYSTEMS: Please refer to the H and P that was dictated on May 30th when he was initially admitted. There have not been any changes. The patient does drink actively.   HE HAS NO KNOWN DRUG ALLERGIES.   There is no surgical history. Currently he is on no medication. He also admitted to marijuana use. He is unemployed and lives with parents.   FAMILY HISTORY: Notable for diabetes.   PHYSICAL EXAMINATION: GENERAL: The patient was somewhat tachy, but blood pressure was stable. Respirations normal. O2 sat was 95.  On exam the patient looks ill. He looks jaundiced. He is somewhat edematous. He appears a little shaky as well.  HEENT/NECK: Showed normocephalic, atraumatic head. Pupils are equally reactive. Throat was clear. Neck was supple. Sclerae were anicteric.  CARDIAC: Revealed a somewhat tachycardic  rhythm. There were no murmurs.  LUNGS: Clear bilaterally.  ABDOMEN: Showed normoactive bowel sounds, soft. There was some mild distention. There were no palpable masses. He had active bowel sounds. There was some mild hepatomegaly.   EXTREMITIES: Showed no clubbing, cyanosis, but there was edema.  SKIN: Other than diffuse jaundice, was okay. NEUROLOGIC: Nonfocal, but he did appear to have a slight asterixis.   In terms of labs, ultrasound was performed of the abdomen that showed evidence of  hepatosplenomegaly. There is a little bit of sludge in the gallbladder wall.   Labs from this morning from May 31st showed a sodium of 135. Potassium was only 2.9, BUN 3, creatinine was  0.41, bilirubin was 7.5, alk phos 194, AST 266. ALT is normal. TSH is normal at 0.660, hemoglobin is 10.0; it was 11.9 on admission. Platelet count was 130,000 and INR is 1.4. Urine was negative. Alcohol level is 427.   This is a patient with alcoholic intoxication with acute alcoholic hepatitis. He has jaundice. He also has acute gastrointestinal bleeding. The history suggests more lower gastrointestinal bleeding because the stool is bright blood in nature. We had him put on octreotide and a Protonix infusion. The patient was also had started on nadolol as well and IV fluid. We can probably wean off the octreotide since the bleeding appears to be lower GI in nature. Will monitor the hemoglobin to be sure it does not drop any further. Will need to correct the potassium. If the bleeding does not stop will recommend doing a colonoscopy to find the  source of bleeding.  I would also recommend doing an upper endoscopy to check for evidence of portal hypertension with portal gastropathy, with a possibility of portal gastropathy and esophageal varices.   Thank you for the referral.     ____________________________ Lupita Dawn. Candace Cruise, MD pyo:dm D: 02/03/2013 07:38:00 ET T: 02/03/2013 09:17:43 ET JOB#: [id number]  cc: Lupita Dawn. Candace Cruise, MD,  <Dictator> Lupita Dawn Enid Maultsby MD ELECTRONICALLY SIGNED 02/04/2013 13:48

## 2014-12-26 NOTE — Consult Note (Signed)
Chief Complaint:  Subjective/Chief Complaint Feels better today. Still jaundiced but LFT improving. Pedal edema and abd edema persist. Less abd cramping and diarrhea.   VITAL SIGNS/ANCILLARY NOTES: **Vital Signs.:   05-Jun-14 14:25  Vital Signs Type POCT  Temperature Temperature (F) 98.1  Celsius 36.7  Temperature Source oral  Pulse Pulse 80  Respirations Respirations 20  Systolic BP Systolic BP 841  Diastolic BP (mmHg) Diastolic BP (mmHg) 70  Mean BP 84  Pulse Ox % Pulse Ox % 96  Pulse Ox Activity Level  At rest  Oxygen Delivery 2L  Pulse Ox Heart Rate 100  Telemetry pattern Cardiac Rhythm Normal sinus rhythm; pattern reported by Telemetry Clerk; 89   Brief Assessment:  GEN no acute distress   Cardiac Regular   Respiratory clear BS   Gastrointestinal distended. nontender   Lab Results: Hepatic:  05-Jun-14 09:09   Bilirubin, Total  12.7  Alkaline Phosphatase  172  SGPT (ALT) 48  SGOT (AST)  157  Total Protein, Serum 6.5  Albumin, Serum  2.5  Routine Chem:  05-Jun-14 09:09   Glucose, Serum  129  BUN 11  Creatinine (comp)  0.43  Sodium, Serum  134  Potassium, Serum 4.6  Chloride, Serum 103  CO2, Serum 27  Calcium (Total), Serum  7.7  Osmolality (calc) 269  eGFR (African American) >60  eGFR (Non-African American) >60 (eGFR values <75m/min/1.73 m2 may be an indication of chronic kidney disease (CKD). Calculated eGFR is useful in patients with stable renal function. The eGFR calculation will not be reliable in acutely ill patients when serum creatinine is changing rapidly. It is not useful in  patients on dialysis. The eGFR calculation may not be applicable to patients at the low and high extremes of body sizes, pregnant women, and vegetarians.)  Anion Gap  4  Routine Hem:  05-Jun-14 09:09   Hemoglobin (CBC)  10.4 (Result(s) reported on 07 Feb 2013 at 09:43AM.)   Assessment/Plan:  Assessment/Plan:  Assessment Acites. Alcoholic  hepatitis. C.diff  colitis.   Plan Contiinue IV solumedrol and flagyl. Increase both lasix and aldactone bid. thanks   Electronic Signatures: OVerdie Shire(MD)  (Signed 05-Jun-14 16:29)  Authored: Chief Complaint, VITAL SIGNS/ANCILLARY NOTES, Brief Assessment, Lab Results, Assessment/Plan   Last Updated: 05-Jun-14 16:29 by OVerdie Shire(MD)

## 2014-12-26 NOTE — Consult Note (Signed)
Chief Complaint:  Subjective/Chief Complaint Seems more alert today. Bleeding essentially stopped. Tested positive for c.diff. No recent exposure to Abx, etc. Still has some diarrhea and dyspeptic sxs.   Brief Assessment:  GEN no acute distress   Cardiac Regular   Respiratory clear BS   Gastrointestinal Normal   Lab Results: Hepatic:  01-Jun-14 04:13   Bilirubin, Total  9.6  Alkaline Phosphatase  169  SGPT (ALT) 52  SGOT (AST)  196  Total Protein, Serum 6.6  Albumin, Serum  2.5  Routine Chem:  01-Jun-14 04:13   Glucose, Serum  139  BUN  3  Creatinine (comp)  0.54  Sodium, Serum  131  Potassium, Serum 3.5  Chloride, Serum 99  CO2, Serum 27  Calcium (Total), Serum  7.1  Osmolality (calc) 261  eGFR (African American) >60  eGFR (Non-African American) >60 (eGFR values <47m/min/1.73 m2 may be an indication of chronic kidney disease (CKD). Calculated eGFR is useful in patients with stable renal function. The eGFR calculation will not be reliable in acutely ill patients when serum creatinine is changing rapidly. It is not useful in  patients on dialysis. The eGFR calculation may not be applicable to patients at the low and high extremes of body sizes, pregnant women, and vegetarians.)  Anion Gap  5  Ammonia, Plasma  91 (Result(s) reported on 03 Feb 2013 at 05:11AM.)  Routine Hem:  01-Jun-14 04:13   WBC (CBC)  11.1  RBC (CBC)  2.74  Hemoglobin (CBC)  10.2  Hematocrit (CBC)  29.2  Platelet Count (CBC)  127  MCV  106  MCH  37.2  MCHC 35.0  RDW  17.8  Neutrophil % 76.5  Lymphocyte % 10.1  Monocyte % 12.1  Eosinophil % 0.6  Basophil % 0.7  Neutrophil #  8.5  Lymphocyte # 1.1  Monocyte #  1.4  Eosinophil # 0.1  Basophil # 0.1 (Result(s) reported on 03 Feb 2013 at 04:54AM.)   Assessment/Plan:  Assessment/Plan:  Assessment Alcoholic hepatitis. LFT worsening. Ammonia elevated. Bleeding stopped. Positive c.diff.   Plan Stopped zosyn. Started flagyl instead. Hold  off on EGD or colonoscopy at this time. Xifaxan started. Ok to wean off octreotide but continue nadolol. THanks.   Electronic Signatures: OVerdie Shire(MD)  (Signed 01-Jun-14 09:02)  Authored: Chief Complaint, Brief Assessment, Lab Results, Assessment/Plan   Last Updated: 01-Jun-14 09:02 by OVerdie Shire(MD)

## 2015-01-30 ENCOUNTER — Emergency Department: Payer: Medicaid Other

## 2015-01-30 ENCOUNTER — Inpatient Hospital Stay
Admission: EM | Admit: 2015-01-30 | Discharge: 2015-02-07 | DRG: 432 | Disposition: A | Discharge status: Home / Self Care | Payer: Medicaid Other | Attending: Internal Medicine | Admitting: Internal Medicine

## 2015-01-30 DIAGNOSIS — Z6834 Body mass index (BMI) 34.0-34.9, adult: Secondary | ICD-10-CM | POA: Diagnosis not present

## 2015-01-30 DIAGNOSIS — D649 Anemia, unspecified: Secondary | ICD-10-CM | POA: Diagnosis present

## 2015-01-30 DIAGNOSIS — I8500 Esophageal varices without bleeding: Secondary | ICD-10-CM | POA: Diagnosis present

## 2015-01-30 DIAGNOSIS — I959 Hypotension, unspecified: Secondary | ICD-10-CM | POA: Diagnosis not present

## 2015-01-30 DIAGNOSIS — D689 Coagulation defect, unspecified: Secondary | ICD-10-CM | POA: Diagnosis present

## 2015-01-30 DIAGNOSIS — D6959 Other secondary thrombocytopenia: Secondary | ICD-10-CM | POA: Diagnosis present

## 2015-01-30 DIAGNOSIS — D638 Anemia in other chronic diseases classified elsewhere: Secondary | ICD-10-CM | POA: Diagnosis present

## 2015-01-30 DIAGNOSIS — W19XXXA Unspecified fall, initial encounter: Secondary | ICD-10-CM | POA: Diagnosis present

## 2015-01-30 DIAGNOSIS — K7030 Alcoholic cirrhosis of liver without ascites: Secondary | ICD-10-CM | POA: Diagnosis present

## 2015-01-30 DIAGNOSIS — S301XXA Contusion of abdominal wall, initial encounter: Secondary | ICD-10-CM | POA: Diagnosis present

## 2015-01-30 DIAGNOSIS — E871 Hypo-osmolality and hyponatremia: Secondary | ICD-10-CM | POA: Diagnosis present

## 2015-01-30 DIAGNOSIS — K2921 Alcoholic gastritis with bleeding: Secondary | ICD-10-CM | POA: Diagnosis present

## 2015-01-30 DIAGNOSIS — F10239 Alcohol dependence with withdrawal, unspecified: Secondary | ICD-10-CM | POA: Diagnosis present

## 2015-01-30 DIAGNOSIS — R161 Splenomegaly, not elsewhere classified: Secondary | ICD-10-CM | POA: Diagnosis present

## 2015-01-30 DIAGNOSIS — K921 Melena: Secondary | ICD-10-CM | POA: Diagnosis present

## 2015-01-30 DIAGNOSIS — Y908 Blood alcohol level of 240 mg/100 ml or more: Secondary | ICD-10-CM | POA: Diagnosis present

## 2015-01-30 DIAGNOSIS — K7010 Alcoholic hepatitis without ascites: Secondary | ICD-10-CM | POA: Diagnosis present

## 2015-01-30 DIAGNOSIS — F1020 Alcohol dependence, uncomplicated: Secondary | ICD-10-CM

## 2015-01-30 DIAGNOSIS — R17 Unspecified jaundice: Secondary | ICD-10-CM

## 2015-01-30 DIAGNOSIS — K766 Portal hypertension: Secondary | ICD-10-CM | POA: Diagnosis present

## 2015-01-30 DIAGNOSIS — F10230 Alcohol dependence with withdrawal, uncomplicated: Secondary | ICD-10-CM

## 2015-01-30 DIAGNOSIS — F10930 Alcohol use, unspecified with withdrawal, uncomplicated: Secondary | ICD-10-CM

## 2015-01-30 DIAGNOSIS — R109 Unspecified abdominal pain: Secondary | ICD-10-CM

## 2015-01-30 DIAGNOSIS — D61818 Other pancytopenia: Secondary | ICD-10-CM | POA: Diagnosis present

## 2015-01-30 HISTORY — DX: Inflammatory liver disease, unspecified: K75.9

## 2015-01-30 HISTORY — DX: Unspecified cirrhosis of liver: K74.60

## 2015-01-30 LAB — CBC WITH DIFFERENTIAL/PLATELET
BAND NEUTROPHILS: 0 % (ref 0–10)
BASOS PCT: 0 % (ref 0–1)
Basophils Absolute: 0 10*3/uL (ref 0.0–0.1)
Blasts: 0 %
Eosinophils Absolute: 0.1 10*3/uL (ref 0.0–0.7)
Eosinophils Relative: 1 % (ref 0–5)
HCT: 30.2 % — ABNORMAL LOW (ref 40.0–52.0)
HEMOGLOBIN: 10.9 g/dL — AB (ref 13.0–18.0)
LYMPHS ABS: 0.6 10*3/uL — AB (ref 0.7–4.0)
LYMPHS PCT: 5 % — AB (ref 12–46)
MCH: 36.1 pg — ABNORMAL HIGH (ref 26.0–34.0)
MCHC: 36 g/dL (ref 32.0–36.0)
MCV: 100.5 fL — ABNORMAL HIGH (ref 80.0–100.0)
MONO ABS: 1.2 10*3/uL — AB (ref 0.1–1.0)
Metamyelocytes Relative: 0 %
Monocytes Relative: 10 % (ref 3–12)
Myelocytes: 0 %
NEUTROS PCT: 84 % — AB (ref 43–77)
NRBC: 0 /100{WBCs}
Neutro Abs: 10.2 10*3/uL — ABNORMAL HIGH (ref 1.7–7.7)
OTHER: 0 %
PROMYELOCYTES ABS: 0 %
Platelets: 61 10*3/uL — ABNORMAL LOW (ref 150–440)
RBC: 3.01 MIL/uL — AB (ref 4.40–5.90)
RDW: 19 % — ABNORMAL HIGH (ref 11.5–14.5)
WBC: 12.1 10*3/uL — AB (ref 3.8–10.6)

## 2015-01-30 LAB — URINALYSIS COMPLETE WITH MICROSCOPIC (ARMC ONLY)
Bacteria, UA: NONE SEEN
Glucose, UA: NEGATIVE mg/dL
KETONES UR: NEGATIVE mg/dL
LEUKOCYTES UA: NEGATIVE
Nitrite: NEGATIVE
PH: 7 (ref 5.0–8.0)
Protein, ur: 30 mg/dL — AB
SPECIFIC GRAVITY, URINE: 1.014 (ref 1.005–1.030)
Squamous Epithelial / LPF: NONE SEEN

## 2015-01-30 LAB — COMPREHENSIVE METABOLIC PANEL
ALK PHOS: 184 U/L — AB (ref 38–126)
ALT: 75 U/L — AB (ref 17–63)
AST: 378 U/L — ABNORMAL HIGH (ref 15–41)
Albumin: 2.7 g/dL — ABNORMAL LOW (ref 3.5–5.0)
Anion gap: 12 (ref 5–15)
BUN: 5 mg/dL — ABNORMAL LOW (ref 6–20)
CHLORIDE: 84 mmol/L — AB (ref 101–111)
CO2: 24 mmol/L (ref 22–32)
Calcium: 7.1 mg/dL — ABNORMAL LOW (ref 8.9–10.3)
Glucose, Bld: 106 mg/dL — ABNORMAL HIGH (ref 65–99)
Potassium: 3.2 mmol/L — ABNORMAL LOW (ref 3.5–5.1)
Sodium: 120 mmol/L — ABNORMAL LOW (ref 135–145)
TOTAL PROTEIN: 6.9 g/dL (ref 6.5–8.1)
Total Bilirubin: 38.9 mg/dL (ref 0.3–1.2)

## 2015-01-30 LAB — AMMONIA: Ammonia: 62 umol/L — ABNORMAL HIGH (ref 9–35)

## 2015-01-30 LAB — ABO/RH: ABO/RH(D): O POS

## 2015-01-30 LAB — PROTIME-INR
INR: 1.66
Prothrombin Time: 19.8 seconds — ABNORMAL HIGH (ref 11.4–15.0)

## 2015-01-30 LAB — APTT: APTT: 48 s — AB (ref 24–36)

## 2015-01-30 LAB — TROPONIN I: Troponin I: <0.03 ng/mL (ref <0.031)

## 2015-01-30 MED ORDER — VITAMIN B-1 100 MG PO TABS
100.0000 mg | ORAL_TABLET | Freq: Every day | ORAL | Status: DC
  Administered 2015-02-01 – 2015-02-07 (×7): 100 mg via ORAL
  Filled 2015-01-30 (×7): qty 1

## 2015-01-30 MED ORDER — SODIUM CHLORIDE 0.9 % IV SOLN
Freq: Once | INTRAVENOUS | Status: AC
  Administered 2015-01-30: 19:00:00 via INTRAVENOUS

## 2015-01-30 MED ORDER — SODIUM CHLORIDE 0.9 % IV SOLN
10.0000 mL/h | Freq: Once | INTRAVENOUS | Status: AC
  Administered 2015-01-30: 10 mL/h via INTRAVENOUS

## 2015-01-30 MED ORDER — LORAZEPAM 2 MG/ML IJ SOLN
0.0000 mg | Freq: Four times a day (QID) | INTRAMUSCULAR | Status: DC
  Administered 2015-01-30 – 2015-01-31 (×2): 2 mg via INTRAVENOUS

## 2015-01-30 MED ORDER — LORAZEPAM 2 MG/ML IJ SOLN
INTRAMUSCULAR | Status: AC
  Administered 2015-01-30: 21:00:00
  Filled 2015-01-30: qty 1

## 2015-01-30 MED ORDER — IOHEXOL 350 MG/ML SOLN
100.0000 mL | Freq: Once | INTRAVENOUS | Status: AC | PRN
  Administered 2015-01-30: 100 mL via INTRAVENOUS

## 2015-01-30 MED ORDER — LORAZEPAM 1 MG PO TABS: 0.0000 mg | ORAL_TABLET | Freq: Four times a day (QID) | ORAL | Status: AC

## 2015-01-30 MED ORDER — LORAZEPAM 2 MG/ML IJ SOLN
0.0000 mg | Freq: Two times a day (BID) | INTRAMUSCULAR | Status: AC
  Filled 2015-01-30: qty 1

## 2015-01-30 MED ORDER — LORAZEPAM 1 MG PO TABS
0.0000 mg | ORAL_TABLET | Freq: Two times a day (BID) | ORAL | Status: DC
Start: 2015-01-30 — End: 2015-02-06
  Administered 2015-02-02: 2 mg via ORAL
  Administered 2015-02-02: 3 mg via ORAL
  Administered 2015-02-03 (×2): 2 mg via ORAL
  Administered 2015-02-04 – 2015-02-05 (×3): 1 mg via ORAL
  Filled 2015-01-30: qty 2
  Filled 2015-01-30 (×2): qty 1
  Filled 2015-01-30: qty 2
  Filled 2015-01-30: qty 3
  Filled 2015-01-30: qty 1
  Filled 2015-01-30: qty 2

## 2015-01-30 MED ORDER — THIAMINE HCL 100 MG/ML IJ SOLN
100.0000 mg | Freq: Every day | INTRAMUSCULAR | Status: DC
  Administered 2015-01-30 – 2015-02-05 (×4): 100 mg via INTRAVENOUS
  Filled 2015-01-30 (×4): qty 2

## 2015-01-30 MED ORDER — MORPHINE SULFATE 2 MG/ML IJ SOLN
2.0000 mg | Freq: Once | INTRAMUSCULAR | Status: AC
  Administered 2015-01-30: 2 mg via INTRAVENOUS

## 2015-01-30 MED ORDER — MORPHINE SULFATE 2 MG/ML IJ SOLN
INTRAMUSCULAR | Status: AC
  Administered 2015-01-30: 2 mg via INTRAVENOUS
  Filled 2015-01-30: qty 1

## 2015-01-30 NOTE — ED Notes (Signed)
Pt fell in bathtub Wednesday night, pain since. Pt noticed he has turned jaundice X2 days, pain in belly, unable to urinate. Large amount of purple/red bruising to left flank that radiates to LLQ and back. Pt hx of liver cirrhosis. Last drink this AM. Pt alert and oriented X4, active, cooperative, pt in NAD. RR even and unlabored, color WNL.

## 2015-01-30 NOTE — ED Provider Notes (Signed)
[facility] Emergency Department Provider Note  ____________________________________________  Time seen: Approximately 6:54 PM  I have reviewed the triage vital signs and the nursing notes.   HISTORY  Chief Complaint Abdominal Pain and Jaundice    HPI Derrick Blanchard is a 34 y.o. male who drinks between 1-2/5 of alcohol a day. Patient reports he began turning yellow and jaundiced just prior to Wednesday. On Wednesday he fell in the bathtub and hit his himself on the left side. Since then he has developed a very large bruise on his left side below the ribs. He reports he's been getting lightheaded today. His belly has also been getting larger and interfering with his breathing. This has been occurring over the last few days. He has a prior admission for alcoholic hepatitis. His bilirubin at that time was over 16. And he was anemic from a GI bleed at that time. While in the ER patient had a large black diarrheal stool. He reports he had not had a black stool before this in the last week or so. At this point I am not sure if the patient is compromised his liver function enough to affect his blood clotting ability. On his previous admission he also had low platelets which could be a possibility now as well. Labs have been sent creatinine is pending. Hemoglobin and hematocrit are also pending. Patient has CIWA ordered as he says he's getting jerked shaky and jerky his last alcohol was this morning. Patient's vital signs remained stable with a blood pressure ranging from 120-140 and a pulse ranging from 94 TO 101. he does not appear to be in acute distress.   Past Medical History  Diagnosis Date  . Cirrhosis     There are no active problems to display for this patient.   History reviewed. No pertinent past surgical history.  No current outpatient prescriptions on file.  Allergies Review of patient's allergies indicates no known allergies.  No family history on  file.  Social History History  Substance Use Topics  . Smoking status: Never Smoker   . Smokeless tobacco: Not on file  . Alcohol Use: Yes    Review of Systems Constitutional: No fever/chills Eyes: No visual changes. ENT: No sore throat. Cardiovascular: Denies chest pain. Respiratory patient reports his large belly is making him having some trouble breathing Gastrointestinal:   No nausea, no vomiting.    No constipation. Genitourinary: Negative for dysuria. Musculoskeletal: Negative for back pain. Skin: Negative for rash. Neurological: Negative for headaches, focal weakness or numbness.  10-point ROS otherwise negative.  ____________________________________________   PHYSICAL EXAM:  VITAL SIGNS: ED Triage Vitals  Enc Vitals Group     BP 01/30/15 1818 125/74 mmHg     Pulse Rate 01/30/15 1818 102     Resp --      Temp 01/30/15 1818 98.1 F (36.7 C)     Temp Source 01/30/15 1818 Oral     SpO2 01/30/15 1818 98 %     Weight 01/30/15 1818 210 lb (95.255 kg)     Height 01/30/15 1818  (1.727 m)     Head Cir --      Peak Flow --      Pain Score 01/30/15 1820 8     Pain Loc --      Pain Edu? --      Excl. in GC? --     Constitutional: Alert and oriented. Well appearing and in no acute distress. Eyes: Conjunctivae are  normal. PERRL. EOMI. Head: Atraumatic. Nose: No congestion/rhinnorhea. Mouth/Throat: Mucous membranes are moist.  Oropharynx non-erythematous. Neck: No stridor.  Cardiovascular: Normal rate, regular rhythm. Grossly normal heart sounds.  Good peripheral circulation. Respiratory: Normal respiratory effort.  No retractions. Lungs CTAB. Gastrointestinal: Soft and only diffusely tender and distended No abdominal bruits. No CVA tenderness. Stool is black and markedly hemmoccult positive Musculoskeletal: No lower extremity tenderness nor edema.  No joint effusions. Neurologic:  Normal speech and language. No gross focal neurologic deficits are appreciated.  Speech is normal. No gait instability. Skin:  Skin is warm, patient is markedly jaundiced patient has vascular spiders on his torso is a very large bruise on the left flank below the ribs Psychiatric: Mood and affect are normal. Speech and behavior are normal.  ____________________________________________   LABS (all labs ordered are listed, but only abnormal results are displayed)  Labs Reviewed  CBC WITH DIFFERENTIAL/PLATELET  COMPREHENSIVE METABOLIC PANEL  PROTIME-INR  APTT  TROPONIN I  AMMONIA  URINALYSIS COMPLETEWITH MICROSCOPIC ([facility] ONLY)  TYPE AND SCREEN  PREPARE RBC (CROSSMATCH)   ____________________________________________  EKG  EKG EKG done at this point because the patient also begins complaining of chest pain and EKG shows a normal sinus rhythm at a rate of 90 normal axis. His reading a prolonged QT interval QTC is 496 ms. Computer QRS is mildly widened at 108 ms the EKG is otherwise normal ____________________________________________  RADIOLOGY  Chest X-ray read as normal. CT reports there is no apparent solid organ injury fluid appears to be ascites ____________________________________________   PROCEDURES  Procedure(s) performed: None  Critical Care performed: No  ____________________________________________   INITIAL IMPRESSION / ASSESSMENT AND PLAN / ED COURSE  Pertinent labs & imaging results that were available during my care of the patient were reviewed by me and considered in my medical decision making (see chart for details).  Called lab and notified them of the need to do a blood type and cross on this gentleman and hold 2 units and reserved for him I also notified that the blood bank that FFP would probably  be required on this gentleman. FAST exam shows fluid in the abdomen this most likely is ascites judging by the patient's history and stability of his vital signs  ----------------------------------------- 7:22 PM on  01/30/2015 -----------------------------------------  Discussed PT PTT INR with hospitalist in view of GI bleed and apparent liver failure will transfuse FFP   ____________________________________________   FINAL CLINICAL IMPRESSION(S) / ED DIAGNOSES  Final diagnoses:  Gastrointestinal hemorrhage with melena  Jaundice  Withdrawal symptoms, alcohol, uncomplicated     Arnaldo NatalPaul F Malinda, MD 01/30/15 2116

## 2015-01-30 NOTE — H&P (Signed)
[facility] [place]   PATIENT NAME: Derrick Blanchard    MR#:  [id number]  DATE OF BIRTH:  12/12/1980  DATE OF ADMISSION:  01/30/2015  PRIMARY CARE PHYSICIAN: No primary care provider on file.   REQUESTING/REFERRING PHYSICIAN: Marge DuncansPaul Melinda  CHIEF COMPLAINT:   Chief Complaint  Patient presents with  . Abdominal Pain  . Jaundice    HISTORY OF PRESENT ILLNESS:  Derrick Blanchard  is a 34 y.o. male with a known history of alcohol usage and alcoholic hepatitis presents to the emergency room with the complaints of left-sided abdominal pain following fall sustained 2 days ago, abdominal distention with yellow discoloration of the skin for the past 3-4 days. The emergency room patient was evaluated with ED physician and was found to be with stable vital signs and while waiting in the ED he had a large melanotic stool. He was noted to have extensive bruising over the left side of the abdominal wall. CT of the abdomen negative for any intra-abdominal injury/blood collection. Lab work with significant abnormalities including markedly elevated bilirubin of 38.9, elevated liver enzymes, elevated ALP, sodium 120, potassium 3.2, serum ammonia 62. H&H 10.9/30.2, platelets 61K, pro time 19.8, INR 1.66. In view of coagulopathy, patient was given 2 units of FFP in the ED, type and crossmatch was done for 2 units of PRBCs which is on hold at this time since patient is hemodynamically stable. Patient received IV pain medications, placed on CIWA protocol. Patient remained stable and pain is under reasonable control.  PAST MEDICAL HISTORY:   Past Medical History  Diagnosis Date  . Cirrhosis   . Hepatitis     PAST SURGICAL HISTORY:  History reviewed. No pertinent past surgical history.  SOCIAL HISTORY:   History  Substance Use Topics  . Smoking status: Never Smoker   . Smokeless tobacco: Not on file  . Alcohol Use: Yes    FAMILY HISTORY:  History  reviewed. No pertinent family history.  DRUG ALLERGIES:  No Known Allergies  REVIEW OF SYSTEMS:   Review of Systems  Constitutional: Positive for malaise/fatigue. Negative for fever and chills.  HENT: Negative for ear pain, hearing loss, nosebleeds, sore throat and tinnitus.   Eyes: Negative for blurred vision, double vision, pain, discharge and redness.  Respiratory: Negative for cough, hemoptysis, sputum production, shortness of breath and wheezing.   Cardiovascular: Negative for chest pain, palpitations, orthopnea and leg swelling.  Gastrointestinal: Positive for nausea, abdominal pain and melena. Negative for vomiting, diarrhea, constipation and blood in stool.  Genitourinary: Negative for dysuria, urgency, frequency and hematuria.       Dark-colored urine +  Musculoskeletal: Negative for back pain, joint pain and neck pain.       Pain left lateral abdominal wall following fall sustained 2 days ago  Skin: Negative for itching and rash.       Yellow discoloration of skin for the past 3-4 days  Neurological: Positive for dizziness. Negative for tingling, sensory change, focal weakness and seizures.  Endo/Heme/Allergies: Bruises/bleeds easily.  Psychiatric/Behavioral: Negative for depression. The patient is not nervous/anxious.     MEDICATIONS AT HOME:   Prior to Admission medications   Not on File      VITAL SIGNS:  Blood pressure 130/85, pulse 104, temperature 98.5 F (36.9 C), temperature source Oral, resp. rate 24, height 5\' 8"  (1.727 m), weight 95.255 kg (210 lb), SpO2 92 %.  PHYSICAL EXAMINATION:  Physical Exam  Constitutional: He is oriented to person,  place, and time. He appears well-developed and well-nourished. No distress.  HENT:  Head: Normocephalic and atraumatic.  Right Ear: External ear normal.  Left Ear: External ear normal.  Nose: Nose normal.  Mouth/Throat: Oropharynx is clear and moist. No oropharyngeal exudate.  Eyes: EOM are normal. Pupils are equal,  round, and reactive to light. Scleral icterus is present.  Neck: Normal range of motion. Neck supple. No JVD present. No thyromegaly present.  Cardiovascular: Normal rate, regular rhythm, normal heart sounds and intact distal pulses.  Exam reveals no friction rub.   No murmur heard. Respiratory: Effort normal and breath sounds normal. No respiratory distress. He has no wheezes. He has no rales. He exhibits no tenderness.  GI: Soft. Bowel sounds are normal. He exhibits distension. He exhibits no mass. There is tenderness. There is no rebound and no guarding.  Musculoskeletal: Normal range of motion. He exhibits no edema.  Lymphadenopathy:    He has no cervical adenopathy.  Neurological: He is alert and oriented to person, place, and time. He has normal reflexes. He displays normal reflexes. No cranial nerve deficit. He exhibits normal muscle tone.  Skin: Skin is warm. No rash noted. No erythema.  Large subacute bruise over left side of the abdomen wall +. Skin deeply icteric.  Psychiatric: He has a normal mood and affect. His behavior is normal. Thought content normal.   LABORATORY PANEL:   CBC  Recent Labs Lab 01/30/15 1823  WBC 12.1*  HGB 10.9*  HCT 30.2*  PLT 61*   ------------------------------------------------------------------------------------------------------------------  Chemistries   Recent Labs Lab 01/30/15 1823  NA 120*  K 3.2*  CL 84*  CO2 24  GLUCOSE 106*  BUN 5*  CREATININE SEE COMMENTS  CALCIUM 7.1*  AST 378*  ALT 75*  ALKPHOS 184*  BILITOT 38.9*   ------------------------------------------------------------------------------------------------------------------  Cardiac Enzymes  Recent Labs Lab 01/30/15 1823  TROPONINI <0.03   ------------------------------------------------------------------------------------------------------------------  RADIOLOGY:  Ct Abdomen Pelvis W Contrast  01/30/2015   CLINICAL DATA:  Status post fall in bathtub,  with persistent left flank pain and bruising. Pain radiates to the left lower quadrant and back. Recent jaundice and inability to urinate. Current history of cirrhosis. Initial encounter.  EXAM: CT ABDOMEN AND PELVIS WITH CONTRAST  TECHNIQUE: Multidetector CT imaging of the abdomen and pelvis was performed using the standard protocol following bolus administration of intravenous contrast.  CONTRAST:  OMNIPAQUE IOHEXOL 350 MG/ML SOLN  COMPARISON:  Right upper quadrant ultrasound performed 02/06/2013, and CT of the abdomen and pelvis from 02/10/2013  FINDINGS: The visualized lung bases are clear.  Extensive nodular heterogeneity is noted throughout the liver. This may reflect a combination of regenerative nodules and necrosis, though underlying malignancy cannot be excluded. The spleen is enlarged, measuring 17.2 cm in length. There is marked prominence of a recanalized umbilical vein, and collateral vessels along the anterior abdominal wall. Gastric, splenic and esophageal varices are seen.  The gallbladder is grossly unremarkable in appearance. Trace ascites is noted surrounding the liver and spleen. The pancreas and adrenal glands are grossly unremarkable.  The kidneys are unremarkable in appearance. There is no evidence of hydronephrosis. No renal or ureteral stones are seen. Mild nonspecific fluid is noted tracking along Gerota's fascia bilaterally.  The small bowel is unremarkable in appearance. The stomach is within normal limits. No acute vascular abnormalities are seen.  Mild soft tissue injury is noted at the left posterolateral flank, with minimal foci of hemorrhage.  The appendix is normal in caliber, without evidence  of appendicitis.  There is mild diffuse wall thickening along much of the colon, with mild surrounding soft tissue inflammation. This may reflect an acute infectious or inflammatory process, or could be seen with hypoproteinemia. Would correlate for associated symptoms.  The bladder is  mildly distended and grossly unremarkable. The prostate remains normal in size. Trace free fluid is noted within the pelvis. No inguinal lymphadenopathy is seen.  No acute osseous abnormalities are identified. 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  IMPRESSION: 1. Mild soft tissue injury noted at the left posterolateral flank, with minimal foci of soft tissue hemorrhage. 2. No additional evidence for traumatic injury to the abdomen or pelvis. 3. Extensive nodular heterogeneity throughout the liver. This may reflect a combination of regenerative nodules and necrosis in association with the patient's worsening cirrhosis, though underlying malignancy cannot be excluded. Would correlate with LFTs and consider dynamic liver protocol MRI or CT for further evaluation, as deemed clinically appropriate. 4. Mild diffuse wall thickening along much of the colon, with mild surrounding soft tissue inflammation. This could reflect an acute infectious or inflammatory process, or could be seen with hypoproteinemia. Would correlate for associated symptoms. 5. Splenomegaly noted. Marked prominence of a recanalized umbilical vein, and gastric, splenic and esophageal varices noted. 6. Trace ascites noted within the abdomen and pelvis. 7. Chronic bilateral pars defects at L5, without evidence of anterolisthesis.   Electronically Signed   By: Roanna Raider M.D.   On: 01/30/2015 20:54   Dg Chest Portable 1 View  01/30/2015   CLINICAL DATA:  Fall in bathtub yesterday  EXAM: PORTABLE CHEST - 1 VIEW  COMPARISON:  None.  FINDINGS: The heart size and mediastinal contours are within normal limits. Both lungs are clear. The visualized skeletal structures are unremarkable.  IMPRESSION: No active disease.   Electronically Signed   By: Alcide Clever M.D.   On: 01/30/2015 19:19    EKG:   Orders placed or performed during the hospital encounter of 01/30/15  . ED EKG NSR with ventricular rate of 90  bpm.   . ED EKG    IMPRESSION AND PLAN:   1. Alcoholic hepatitis. Markedly elevated LFTs with elevated serum ammonia. CT of the abdomen with significant hepatic nodularity. Need further workup. Plan: Admit to stepdown unit, monitored hemodynamically closely, lactulose by mouth, monitor LFTs, monitor serial CBCs, pain control measures as needed, GI consultation for further evaluation. Check alpha-fetoprotein, hepatitis B and hepatitis C labs. 2. Alcohol usage, monitor for withdrawals-place on CIWA protocol. 3. Melena. Likely GI bleed from esophageal varices, rule out intraluminal pathology. Patient hemodynamically stable. Plan: Monitor hemodynamically,'s serial CBC monitoring, IV Protonix, GI consult for further evaluation. 4. Hematoma involving the left lateral abdominal wall secondary to fall sustained 2 days ago. Patient hemodynamically stable, monitor. 5. Coagulopathy with thrombocytopenia secondary to alcoholic hepatitis. Patient received 2 units of FFP in the ED, stable hemodynamically, monitor, follow-up labs. 6. Hyponatremia. Gentle IV hydration, potassium supplementation, follow-up BMP.    All the records are reviewed and case discussed with ED provider. Management plans discussed with the patient, family and they are in agreement.  CODE STATUS: Full code  TOTAL TIME TAKING CARE OF THIS PATIENT: 50 minutes.    Jonnie Kind N M.D on 01/30/2015 at 11:58 PM  Between 7am to 6pm - Pager - [phone]  After 6pm go to [email or web address] - password [email or web address] [facility]  [facility]  Office  [phone]  CC: Primary care physician; No primary  care provider on file.

## 2015-01-30 NOTE — ED Notes (Signed)
Small BM, black tarry stool.

## 2015-01-30 NOTE — ED Notes (Signed)
Family at bedside. 

## 2015-01-30 NOTE — ED Notes (Signed)
Patient transported to CT 

## 2015-01-30 NOTE — ED Notes (Signed)
Hemoccult positive from stool sample Pt usually drinks 1-1/2 fifth of vodka today

## 2015-01-31 ENCOUNTER — Inpatient Hospital Stay: Payer: Medicaid Other

## 2015-01-31 LAB — CBC
HCT: 27.3 % — ABNORMAL LOW (ref 40.0–52.0)
HCT: 28.5 % — ABNORMAL LOW (ref 40.0–52.0)
HCT: 27.5 % — ABNORMAL LOW (ref 40.0–52.0)
HEMATOCRIT: 27 % — AB (ref 40.0–52.0)
HEMOGLOBIN: 10 g/dL — AB (ref 13.0–18.0)
Hemoglobin: 10 g/dL — ABNORMAL LOW (ref 13.0–18.0)
Hemoglobin: 10 g/dL — ABNORMAL LOW (ref 13.0–18.0)
Hemoglobin: 9.6 g/dL — ABNORMAL LOW (ref 13.0–18.0)
MCH: 36.9 pg — ABNORMAL HIGH (ref 26.0–34.0)
MCH: 35.7 pg — AB (ref 26.0–34.0)
MCH: 36.7 pg — ABNORMAL HIGH (ref 26.0–34.0)
MCH: 36.4 pg — ABNORMAL HIGH (ref 26.0–34.0)
MCHC: 36.6 g/dL — ABNORMAL HIGH (ref 32.0–36.0)
MCHC: 35.2 g/dL (ref 32.0–36.0)
MCHC: 36.3 g/dL — AB (ref 32.0–36.0)
MCHC: 35.7 g/dL (ref 32.0–36.0)
MCV: 100.7 fL — AB (ref 80.0–100.0)
MCV: 101.6 fL — ABNORMAL HIGH (ref 80.0–100.0)
MCV: 101.1 fL — ABNORMAL HIGH (ref 80.0–100.0)
MCV: 102.1 fL — AB (ref 80.0–100.0)
PLATELETS: 56 10*3/uL — AB (ref 150–440)
PLATELETS: 59 10*3/uL — AB (ref 150–440)
Platelets: 54 10*3/uL — ABNORMAL LOW (ref 150–440)
Platelets: 61 10*3/uL — ABNORMAL LOW (ref 150–440)
RBC: 2.71 MIL/uL — ABNORMAL LOW (ref 4.40–5.90)
RBC: 2.81 MIL/uL — ABNORMAL LOW (ref 4.40–5.90)
RBC: 2.71 MIL/uL — ABNORMAL LOW (ref 4.40–5.90)
RBC: 2.65 MIL/uL — ABNORMAL LOW (ref 4.40–5.90)
RDW: 20 % — ABNORMAL HIGH (ref 11.5–14.5)
RDW: 20 % — ABNORMAL HIGH (ref 11.5–14.5)
RDW: 20.4 % — AB (ref 11.5–14.5)
RDW: 20.4 % — AB (ref 11.5–14.5)
WBC: 10.5 10*3/uL (ref 3.8–10.6)
WBC: 12.4 10*3/uL — AB (ref 3.8–10.6)
WBC: 9.7 10*3/uL (ref 3.8–10.6)
WBC: 8.7 10*3/uL (ref 3.8–10.6)

## 2015-01-31 LAB — MRSA PCR SCREENING: MRSA by PCR: POSITIVE — AB

## 2015-01-31 LAB — GLUCOSE, CAPILLARY: Glucose-Capillary: 89 mg/dL (ref 65–99)

## 2015-01-31 LAB — PREPARE FRESH FROZEN PLASMA
UNIT DIVISION: 0
Unit division: 0

## 2015-01-31 LAB — PROTIME-INR
INR: 1.61
PROTHROMBIN TIME: 19.3 s — AB (ref 11.4–15.0)

## 2015-01-31 LAB — AMMONIA: AMMONIA: 50 umol/L — AB (ref 9–35)

## 2015-01-31 MED ORDER — ONDANSETRON HCL 4 MG/2ML IJ SOLN: 4.0000 mg | Freq: Four times a day (QID) | INTRAMUSCULAR | Status: DC | PRN

## 2015-01-31 MED ORDER — MORPHINE SULFATE 2 MG/ML IJ SOLN
1.0000 mg | INTRAMUSCULAR | Status: DC | PRN
  Administered 2015-01-31 – 2015-02-06 (×12): 1 mg via INTRAVENOUS
  Filled 2015-01-31 (×12): qty 1

## 2015-01-31 MED ORDER — ONDANSETRON HCL 4 MG PO TABS: 4.0000 mg | ORAL_TABLET | Freq: Four times a day (QID) | ORAL | Status: DC | PRN

## 2015-01-31 MED ORDER — POTASSIUM CHLORIDE IN NACL 20-0.9 MEQ/L-% IV SOLN
INTRAVENOUS | Status: AC
  Administered 2015-01-31: 03:00:00 via INTRAVENOUS
  Filled 2015-01-31: qty 1000

## 2015-01-31 MED ORDER — PREDNISONE 20 MG PO TABS: 40.0000 mg | ORAL_TABLET | Freq: Every day | ORAL | Status: DC

## 2015-01-31 MED ORDER — MUPIROCIN 2 % EX OINT
1.0000 "application " | TOPICAL_OINTMENT | Freq: Two times a day (BID) | CUTANEOUS | Status: AC
  Administered 2015-01-31 – 2015-02-04 (×9): 1 via NASAL
  Filled 2015-01-31: qty 22

## 2015-01-31 MED ORDER — LORAZEPAM 2 MG/ML IJ SOLN
INTRAMUSCULAR | Status: AC
  Administered 2015-01-31: 2 mg via INTRAVENOUS
  Filled 2015-01-31: qty 1

## 2015-01-31 MED ORDER — LACTULOSE 10 GM/15ML PO SOLN
30.0000 g | Freq: Three times a day (TID) | ORAL | Status: DC
  Administered 2015-01-31 – 2015-02-01 (×5): 30 g via ORAL
  Filled 2015-01-31 (×5): qty 60

## 2015-01-31 MED ORDER — PREDNISOLONE 15 MG/5ML PO SOLN
40.0000 mg | Freq: Every day | ORAL | Status: DC
  Administered 2015-01-31 – 2015-02-06 (×7): 40 mg via ORAL
  Filled 2015-01-31 (×9): qty 13.3

## 2015-01-31 MED ORDER — CHLORHEXIDINE GLUCONATE CLOTH 2 % EX PADS
6.0000 | MEDICATED_PAD | Freq: Every day | CUTANEOUS | Status: AC
  Administered 2015-02-03 – 2015-02-05 (×3): 6 via TOPICAL

## 2015-01-31 MED ORDER — LORAZEPAM 2 MG/ML IJ SOLN
0.0000 mg | INTRAMUSCULAR | Status: DC | PRN
  Administered 2015-01-31: 2 mg via INTRAVENOUS
  Administered 2015-01-31: 1 mg via INTRAVENOUS
  Administered 2015-02-01 (×2): 2 mg via INTRAVENOUS
  Administered 2015-02-05: 1 mg via INTRAVENOUS
  Filled 2015-01-31: qty 1
  Filled 2015-01-31: qty 2
  Filled 2015-01-31 (×4): qty 1

## 2015-01-31 MED ORDER — PANTOPRAZOLE SODIUM 40 MG IV SOLR
40.0000 mg | Freq: Two times a day (BID) | INTRAVENOUS | Status: DC
  Administered 2015-01-31 – 2015-02-02 (×7): 40 mg via INTRAVENOUS
  Filled 2015-01-31 (×7): qty 40

## 2015-01-31 MED ORDER — POTASSIUM CHLORIDE CRYS ER 20 MEQ PO TBCR
40.0000 meq | EXTENDED_RELEASE_TABLET | Freq: Once | ORAL | Status: AC
  Administered 2015-01-31: 40 meq via ORAL
  Filled 2015-01-31: qty 2

## 2015-01-31 NOTE — Progress Notes (Addendum)
Patient is arousable to voice, sleeping between care, oriented. [facility], bedpan at times due to weakness. CIWA scale used for ativan x 1 this shift. Room air with oxygen saturation WNL. Tolerating liquid diet with poor appetite. BM today. Potassium replacement ordered and administered per MD.Orders placed for positive MRSA swab. Resting quietly between care. To be transferred to room 159. [facility] Will transfer shortly.

## 2015-01-31 NOTE — Progress Notes (Signed)
Continue to monitor

## 2015-01-31 NOTE — Progress Notes (Signed)
[facility]                                                                                                                                                                                            Patient Demographics   Derrick DalesChristopher Blanchard, is a 34 y.o. male, DOB - [birth year], ZOX:[id number]  Admit date - 01/30/2015   Admitting Physician Crissie FiguresEdavally N Reddy, MD  Outpatient Primary MD for the patient is No primary care provider on file.  LOS - 1  Chief Complaint  Patient presents with  . Abdominal Pain  . Jaundice       Patient still complains of abdominal pain being very jaundice. Feels nauseous  Review of Systems:   CONSTITUTIONAL: No documented fever. No fatigue, weakness. No weight gain, no weight loss.  EYES: No blurry or double vision.  ENT: No tinnitus. No postnasal drip. No redness of the oropharynx.  RESPIRATORY: No cough, no wheeze, no hemoptysis. No dyspnea.  CARDIOVASCULAR: No chest pain. No orthopnea. No palpitations. No syncope.  GASTROINTESTINAL: Positive nausea, no vomiting or diarrhea. No abdominal pain. Dark color stools. Jaundice GENITOURINARY: No dysuria or hematuria.  ENDOCRINE: No polyuria or nocturia. No heat or cold intolerance.  HEMATOLOGY: No anemia. No bruising. No bleeding.  INTEGUMENTARY: No rashes. No lesions. Jaundice  MUSCULOSKELETAL: No arthritis. No swelling. No gout.  NEUROLOGIC: No numbness, tingling, or ataxia. No seizure-type activity.  PSYCHIATRIC: No anxiety. No insomnia. No ADD.    Vitals:   Filed Vitals:   01/31/15 0800 01/31/15 0930 01/31/15 1000 01/31/15 1200  BP: 99/64 119/76 113/61 122/73  Pulse: 91 100 96 93  Temp:      TempSrc:      Resp: 22 25 21 20   Height:      Weight:      SpO2: 99% 96% 97% 98%    Wt Readings from Last 3 Encounters:  01/31/15 103 kg (227 lb 1.2 oz)     Intake/Output Summary (Last 24 hours) at 01/31/15 1225 Last data filed at 01/31/15 0600  Gross per  24 hour  Intake 503.17 ml  Output   1400 ml  Net -896.83 ml    Physical Exam:   GENERAL: Chronically ill-appearing jaundiced HEAD, EYES, EARS, NOSE AND THROAT: Atraumatic, normocephalic. Extraocular muscles are intact. Pupils equal and reactive to light. Sclerae anicteric. No conjunctival injection. No oro-pharyngeal erythema.  NECK: Supple. There is no jugular venous distention. No bruits, no lymphadenopathy, no thyromegaly.  HEART: Regular rate and rhythm, tachycardic. No murmurs, no rubs, no clicks.  LUNGS: Clear to auscultation bilaterally. No rales or rhonchi.  No wheezes.  ABDOMEN: Distended positive bowel sounds 4 , no guarding or rebound   EXTREMITIES: No evidence of any cyanosis, clubbing, or peripheral edema.  +2 pedal and radial pulses bilaterally.  NEUROLOGIC: The patient is alert, awake, and oriented x3 with no focal motor or sensory deficits appreciated bilaterally.  SKINHe is jaundiced   Psych: Not anxious, depressed LN: No inguinal LN enlargement    Antibiotics   Anti-infectives    None      Medications   Scheduled Meds: . [START ON 02/01/2015] Chlorhexidine Gluconate Cloth  6 each Topical Q0600  . lactulose  30 g Oral TID  . LORazepam  0-4 mg Intravenous Q12H  . LORazepam  0-4 mg Oral 4 times per day  . LORazepam  0-4 mg Oral Q12H  . mupirocin ointment  1 application Nasal BID  . pantoprazole (PROTONIX) IV  40 mg Intravenous Q12H  . [START ON 02/01/2015] predniSONE  40 mg Oral Q breakfast  . thiamine  100 mg Intravenous Daily  . thiamine  100 mg Oral Daily   Continuous Infusions: . 0.9 % NaCl with KCl 20 mEq / L 50 mL/hr at 01/31/15 0900   PRN Meds:.LORazepam, morphine injection, ondansetron **OR** ondansetron (ZOFRAN) IV   Data Review:   Micro Results Recent Results (from the past 240 hour(s))  MRSA PCR Screening     Status: Abnormal   Collection Time: 01/31/15  9:45 AM  Result Value Ref Range Status   MRSA by PCR POSITIVE (A) NEGATIVE Final     Comment:        The GeneXpert MRSA Assay (FDA approved for NASAL specimens only), is one component of a comprehensive MRSA colonization surveillance program. It is not intended to diagnose MRSA infection nor to guide or monitor treatment for MRSA infections. CRITICAL RESULT CALLED TO, READ BACK BY AND VERIFIED WITH: ERIN MAYNOR ON 01/31/15 AT 1137 BY QSD     Radiology Reports Ct Abdomen Pelvis W Contrast  01/30/2015   CLINICAL DATA:  Status post fall in bathtub, with persistent left flank pain and bruising. Pain radiates to the left lower quadrant and back. Recent jaundice and inability to urinate. Current history of cirrhosis. Initial encounter.  EXAM: CT ABDOMEN AND PELVIS WITH CONTRAST  TECHNIQUE: Multidetector CT imaging of the abdomen and pelvis was performed using the standard protocol following bolus administration of intravenous contrast.  CONTRAST:  OMNIPAQUE IOHEXOL 350 MG/ML SOLN  COMPARISON:  Right upper quadrant ultrasound performed 02/06/2013, and CT of the abdomen and pelvis from 02/10/2013  FINDINGS: The visualized lung bases are clear.  Extensive nodular heterogeneity is noted throughout the liver. This may reflect a combination of regenerative nodules and necrosis, though underlying malignancy cannot be excluded. The spleen is enlarged, measuring 17.2 cm in length. There is marked prominence of a recanalized umbilical vein, and collateral vessels along the anterior abdominal wall. Gastric, splenic and esophageal varices are seen.  The gallbladder is grossly unremarkable in appearance. Trace ascites is noted surrounding the liver and spleen. The pancreas and adrenal glands are grossly unremarkable.  The kidneys are unremarkable in appearance. There is no evidence of hydronephrosis. No renal or ureteral stones are seen. Mild nonspecific fluid is noted tracking along Gerota's fascia bilaterally.  The small bowel is unremarkable in appearance. The stomach is within normal limits.  No acute vascular abnormalities are seen.  Mild soft tissue injury is noted at the left posterolateral flank, with minimal foci of hemorrhage.  The appendix is normal in  caliber, without evidence of appendicitis.  There is mild diffuse wall thickening along much of the colon, with mild surrounding soft tissue inflammation. This may reflect an acute infectious or inflammatory process, or could be seen with hypoproteinemia. Would correlate for associated symptoms.  The bladder is mildly distended and grossly unremarkable. The prostate remains normal in size. Trace free fluid is noted within the pelvis. No inguinal lymphadenopathy is seen.  No acute osseous abnormalities are identified. 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  IMPRESSION: 1. Mild soft tissue injury noted at the left posterolateral flank, with minimal foci of soft tissue hemorrhage. 2. No additional evidence for traumatic injury to the abdomen or pelvis. 3. Extensive nodular heterogeneity throughout the liver. This may reflect a combination of regenerative nodules and necrosis in association with the patient's worsening cirrhosis, though underlying malignancy cannot be excluded. Would correlate with LFTs and consider dynamic liver protocol MRI or CT for further evaluation, as deemed clinically appropriate. 4. Mild diffuse wall thickening along much of the colon, with mild surrounding soft tissue inflammation. This could reflect an acute infectious or inflammatory process, or could be seen with hypoproteinemia. Would correlate for associated symptoms. 5. Splenomegaly noted. Marked prominence of a recanalized umbilical vein, and gastric, splenic and esophageal varices noted. 6. Trace ascites noted within the abdomen and pelvis. 7. Chronic bilateral pars defects at L5, without evidence of anterolisthesis.   Electronically Signed   By: Roanna Raider M.D.   On: 01/30/2015 20:54   Dg Chest Portable 1  View  01/30/2015   CLINICAL DATA:  Fall in bathtub yesterday  EXAM: PORTABLE CHEST - 1 VIEW  COMPARISON:  None.  FINDINGS: The heart size and mediastinal contours are within normal limits. Both lungs are clear. The visualized skeletal structures are unremarkable.  IMPRESSION: No active disease.   Electronically Signed   By: Alcide Clever M.D.   On: 01/30/2015 19:19   US Abdomen Limited Ruq  01/31/2015   CLINICAL DATA:  Alcoholic hepatitis  EXAM: US ABDOMEN LIMITED - RIGHT UPPER QUADRANT  COMPARISON:  CT 01/30/2015  FINDINGS: Gallbladder:  Dependent sludge or debris within the gallbladder, with trace pericholecystic fluid. Wall thickness 2 mm. No sonographic Murphy sign.  Common bile duct:  Diameter: 4 mm  Liver:  Hepatic increased echogenicity likely indicates underlying steatosis without focal abnormality or intrahepatic ductal dilatation.  Small ascites noted adjacent to the liver.  IMPRESSION: Dependent gallbladder sludge or debris with a small amount of pericholecystic fluid which could indicate acute cholecystitis although tracking of pericolonic ascites could appear similar.  Hepatic increased echogenicity likely indicates underlying steatosis without focal abnormality or intrahepatic ductal dilatation.   Electronically Signed   By: Christiana Pellant M.D.   On: 01/31/2015 09:34     CBC  Recent Labs Lab 01/30/15 1823 01/31/15 0338 01/31/15 0830  WBC 12.1* 10.5 12.4*  HGB 10.9* 10.0* 10.0*  HCT 30.2* 27.3* 28.5*  PLT 61* 56* 59*  MCV 100.5* 100.7* 101.6*  MCH 36.1* 36.9* 35.7*  MCHC 36.0 36.6* 35.2  RDW 19.0* 20.0* 20.0*  LYMPHSABS 0.6*  --   --   MONOABS 1.2*  --   --   EOSABS 0.1  --   --   BASOSABS 0.0  --   --     Chemistries   Recent Labs Lab 01/30/15 1823 01/31/15 0338  NA 120* 124*  K 3.2* 3.0*  CL 84* 88*  CO2 24 24  GLUCOSE 106* 101*  BUN 5* 6  CREATININE SEE COMMENTS SEE COMMENTS  CALCIUM 7.1* 7.2*  AST 378* 328*  ALT 75* 74*  ALKPHOS 184* 159*  BILITOT 38.9*  40.2*   ------------------------------------------------------------------------------------------------------------------ CrCl cannot be calculated (Patient has no serum creatinine result on file.). ------------------------------------------------------------------------------------------------------------------ No results for input(s): HGBA1C in the last 72 hours. ------------------------------------------------------------------------------------------------------------------ No results for input(s): CHOL, HDL, LDLCALC, TRIG, CHOLHDL, LDLDIRECT in the last 72 hours. ------------------------------------------------------------------------------------------------------------------ No results for input(s): TSH, T4TOTAL, T3FREE, THYROIDAB in the last 72 hours.  Invalid input(s): FREET3 ------------------------------------------------------------------------------------------------------------------ No results for input(s): VITAMINB12, FOLATE, FERRITIN, TIBC, IRON, RETICCTPCT in the last 72 hours.  Coagulation profile  Recent Labs Lab 01/30/15 1823 01/31/15 0338  INR 1.66 1.61    No results for input(s): DDIMER in the last 72 hours.  Cardiac Enzymes  Recent Labs Lab 01/30/15 1823  TROPONINI <0.03   ------------------------------------------------------------------------------------------------------------------ Invalid input(s): POCBNP    Assessment & Plan   Principal Problem:   Alcoholic hepatitis; at this time will go ahead and start him on a prednisone 40 mg daily. Will have GI follow the patient follow-up on hepatitis C hepatitis B hepatitis A evaluation. 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 Monitor his hemoglobin   Anemia Likely anemia of chronic disease due to his liver  disease as well as some blood loss      Code Status Orders        Start     Ordered   01/31/15 0200  Full code   Continuous     01/31/15 0159        DVT Prophylaxis  SCDs   Lab Results  Component Value Date   PLT 59* 01/31/2015     Time Spent in minutes   45 minutes    Auburn Bilberry M.D on 01/31/2015 at 12:25 PM  Between 7am to 6pm - Pager - [phone]  After 6pm go to [email or web address] - password [email or web address] [facility]  [facility]   Office  [phone]

## 2015-01-31 NOTE — Consult Note (Addendum)
GI Inpatient Consult Note  Reason for Consult: ETOH Hepatitis   Attending Requesting Consult: Allena KatzPatel  History of Present Illness: Derrick Blanchard is a 34 y.o. male with a past medical history notable for alcohol cirrhosis, history of alcoholic hepatitis, ongoing alcohol use who presented for evaluation of abdominal pain and a fall at home. In the setting of working this up it was noted that Derrick Blanchard has a severely elevated total bilirubin at 40. He also has an INR of 1.6. He has previous episodes of alcoholic hepatitis. [facility] but has not seen in the proximal for approximately a year. He does report being alcohol free for approximately 8-10 months but then has been drinking heavily recently.  He currently has diffuse abdominal pain which is worse in the right upper quadrant. Otherwise he denies trouble with rectal bleeding, melena, nausea, vomiting, confusion-like episodes.  Past Medical History:  Past Medical History  Diagnosis Date  . Cirrhosis   . Hepatitis     Problem List: Patient Active Problem List   Diagnosis Date Noted  . Alcoholic hepatitis 01/30/2015  . Alcohol use 01/30/2015  . Melena 01/30/2015  . Coagulopathy 01/30/2015  . Hyponatremia 01/30/2015  . Bruise, trunk 01/30/2015  . Anemia 01/30/2015    Past Surgical History: History reviewed. No pertinent past surgical history.  Allergies: No Known Allergies  Home Medications: No prescriptions prior to admission   Home medication reconciliation was completed with the patient.   Scheduled Inpatient Medications:   . [START ON 02/01/2015] Chlorhexidine Gluconate Cloth  6 each Topical Q0600  . lactulose  30 g Oral TID  . LORazepam  0-4 mg Intravenous Q12H  . LORazepam  0-4 mg Oral 4 times per day  . LORazepam  0-4 mg Oral Q12H  . mupirocin ointment  1 application Nasal BID  . pantoprazole (PROTONIX) IV  40 mg Intravenous Q12H  . [START ON 02/01/2015] predniSONE  40 mg Oral Q  breakfast  . thiamine  100 mg Intravenous Daily  . thiamine  100 mg Oral Daily    Continuous Inpatient Infusions:     PRN Inpatient Medications:  LORazepam, morphine injection, ondansetron **OR** ondansetron (ZOFRAN) IV  Family History:   The patient's family history is negative for inflammatory bowel disorders, GI malignancy, or solid organ transplantation.  Social History:   reports that he has never smoked. He does not have any smokeless tobacco history on file. He reports that he drinks alcohol. He reports that he uses illicit drugs.   Review of Systems: Constitutional: Weight is stable.  Eyes: No changes in vision. ENT: No oral lesions, sore throat.  GI: see HPI.  Heme/Lymph: + easy bruising CV: No chest pain.  GU: No hematuria.  Integumentary: No rashes.  Neuro: No headaches.  Psych: + depression/anxiety.  Endocrine: No heat/cold intolerance.  Allergic/Immunologic: + urticaria.  Resp: No cough, SOB.  Musculoskeletal: No joint swelling.    Physical Examination: BP 116/64 mmHg  Pulse 99  Temp(Src) 98.3 F (36.8 C) (Oral)  Resp 21  Ht 5\' 8"  (1.727 m)  Wt 227 lb 1.2 oz (103 kg)  BMI 34.53 kg/m2  SpO2 99% Gen: NAD, alert and oriented x 4, , + jaundice HEENT: PEERLA, EOMI, + scleral icterus Neck: supple, no JVD or thyromegaly Chest: CTA bilaterally, no wheezes, crackles, or other adventitious sounds CV: RRR, no m/g/c/r Abd: + distended, mild diffuse TTP, no r/g.  Ext: mild edema, well perfused with 2+ pulses, Skin: no  rash or lesions noted, + jaundice Lymph: no LAD  Data: Lab Results  Component Value Date   WBC 9.7 01/31/2015   HGB 10.0* 01/31/2015   HCT 27.5* 01/31/2015   MCV 101.1* 01/31/2015   PLT 54* 01/31/2015    Recent Labs Lab 01/31/15 0338 01/31/15 0830 01/31/15 1437  HGB 10.0* 10.0* 10.0*   Lab Results  Component Value Date   NA 124* 01/31/2015   K 3.0* 01/31/2015   CL 88* 01/31/2015   CO2 24 01/31/2015   BUN 6 01/31/2015    CREATININE SEE COMMENTS 01/31/2015   Lab Results  Component Value Date   ALT 74* 01/31/2015   AST 328* 01/31/2015   ALKPHOS 159* 01/31/2015   BILITOT 40.2* 01/31/2015    Recent Labs Lab 01/30/15 1823 01/31/15 0338  APTT 48*  --   INR 1.66 1.61   Assessment/Plan: Derrick Blanchard is a 34 y.o. male with alcohol cirrhosis who also has severe ETOH hepatitis at this time. The [facility] discriminant function score is 54 dictating high risk of morbidity and mortality. This also would justify starting a course of prednisolone. I did spend significant time counseling Derrick Blanchard on the seriousness of his condition, the high risk of mortality. I also did tell him as have other physicians that if he does not stop drinking he would likely not survive much longer. He also understands the significant risk that he will may not survive this current episode of alcoholic hepatitis.  Recommendations: #1: Change prednisone to prednisolone 40 mg daily #2: Follow daily INR and liver enzymes #3: Low salt diet  #4: Avoid all sedating meds   #5: Arrange alcohol detox  Thank you for the consult. Please call with questions or concerns.  REIN, Addison Naegeli, MD   Addendum:  Dark stools for past two days.  No change in Hgb since over a year ago and no change in hemodynamics.  This is likely oozing ETOH gastritis or oozing portal HTN gastropathy.   - cont protonix, monitior Hgb.  Give VitK.   Will need non-urgent EGD.

## 2015-02-01 LAB — CBC
HCT: 27.2 % — ABNORMAL LOW (ref 40.0–52.0)
Hemoglobin: 9.8 g/dL — ABNORMAL LOW (ref 13.0–18.0)
MCH: 37.1 pg — ABNORMAL HIGH (ref 26.0–34.0)
MCHC: 36 g/dL (ref 32.0–36.0)
MCV: 102.9 fL — ABNORMAL HIGH (ref 80.0–100.0)
Platelets: 56 10*3/uL — ABNORMAL LOW (ref 150–440)
RBC: 2.65 MIL/uL — ABNORMAL LOW (ref 4.40–5.90)
RDW: 21.1 % — ABNORMAL HIGH (ref 11.5–14.5)
WBC: 8 10*3/uL (ref 3.8–10.6)

## 2015-02-01 LAB — COMPREHENSIVE METABOLIC PANEL
ALT: 63 U/L (ref 17–63)
AST: 249 U/L — ABNORMAL HIGH (ref 15–41)
Albumin: 2.4 g/dL — ABNORMAL LOW (ref 3.5–5.0)
Alkaline Phosphatase: 143 U/L — ABNORMAL HIGH (ref 38–126)
Anion gap: 9 (ref 5–15)
BILIRUBIN TOTAL: 40.8 mg/dL — AB (ref 0.3–1.2)
BUN: 8 mg/dL (ref 6–20)
CALCIUM: 7.4 mg/dL — AB (ref 8.9–10.3)
CHLORIDE: 94 mmol/L — AB (ref 101–111)
CO2: 27 mmol/L (ref 22–32)
Glucose, Bld: 133 mg/dL — ABNORMAL HIGH (ref 65–99)
Potassium: 3.2 mmol/L — ABNORMAL LOW (ref 3.5–5.1)
Sodium: 130 mmol/L — ABNORMAL LOW (ref 135–145)
Total Protein: 6.5 g/dL (ref 6.5–8.1)

## 2015-02-01 LAB — PROTIME-INR
INR: 1.8
PROTHROMBIN TIME: 21.1 s — AB (ref 11.4–15.0)

## 2015-02-01 LAB — AFP TUMOR MARKER: AFP-Tumor Marker: 7.2 ng/mL (ref 0.0–8.3)

## 2015-02-01 MED ORDER — POTASSIUM CHLORIDE CRYS ER 20 MEQ PO TBCR
40.0000 meq | EXTENDED_RELEASE_TABLET | ORAL | Status: AC
  Administered 2015-02-01 (×2): 40 meq via ORAL
  Filled 2015-02-01 (×2): qty 2

## 2015-02-01 NOTE — Progress Notes (Signed)
[facility] [place] [facility]                                                                                                                                                                                            Patient Demographics   Derrick DalesChristopher Blanchard, is a 34 y.o. male, DOB - 09/09/1980, MWN:[id number]  Admit date - 01/30/2015   Admitting Physician Crissie FiguresEdavally N Reddy, MD  Outpatient Primary MD for the patient is No primary care provider on file.  LOS - 2  Chief Complaint  Patient presents with  . Abdominal Pain  . Jaundice      States that he wants his diet advance still has pain on the right side of the abdomen  Review of Systems:   CONSTITUTIONAL: No documented fever. No fatigue, weakness. No weight gain, no weight loss.  EYES: No blurry or double vision.  ENT: No tinnitus. No postnasal drip. No redness of the oropharynx.  RESPIRATORY: No cough, no wheeze, no hemoptysis. No dyspnea.  CARDIOVASCULAR: No chest pain. No orthopnea. No palpitations. No syncope.  GASTROINTESTINAL: Positive nausea, no vomiting or diarrhea. Right abdominal pain. Dark color stools. Positive Jaundice GENITOURINARY: No dysuria or hematuria.  ENDOCRINE: No polyuria or nocturia. No heat or cold intolerance.  HEMATOLOGY: No anemia. No bruising. No bleeding.  INTEGUMENTARY: No rashes. No lesions. Jaundice  MUSCULOSKELETAL: No arthritis. No swelling. No gout.  NEUROLOGIC: No numbness, tingling, or ataxia. No seizure-type activity.  PSYCHIATRIC: No anxiety. No insomnia. No ADD.    Vitals:   Filed Vitals:   01/31/15 2123 02/01/15 0216 02/01/15 0446 02/01/15 0820  BP: 117/57  113/56 111/67  Pulse: 108 97 93 87  Temp: 98 F (36.7 C)  98.5 F (36.9 C) 98.7 F (37.1 C)  TempSrc: Oral  Oral Oral  Resp: 20  20 18   Height:      Weight:   101.061 kg (222 lb 12.8 oz)   SpO2: 98% 95% 96% 98%    Wt Readings from Last 3 Encounters:  02/01/15 101.061 kg (222 lb 12.8 oz)      Intake/Output Summary (Last 24 hours) at 02/01/15 1109 Last data filed at 02/01/15 1023  Gross per 24 hour  Intake    700 ml  Output    250 ml  Net    450 ml    Physical Exam:   GENERAL: Chronically ill-appearing jaundiced HEAD, EYES, EARS, NOSE AND THROAT: Atraumatic, normocephalic. Extraocular muscles are intact. Pupils equal and reactive to light. Sclerae anicteric. No conjunctival injection. No oro-pharyngeal erythema.  NECK: Supple. There is no jugular venous distention. No bruits,  no lymphadenopathy, no thyromegaly.  HEART: Regular rate and rhythm, tachycardic. No murmurs, no rubs, no clicks.  LUNGS: Clear to auscultation bilaterally. No rales or rhonchi. No wheezes.  ABDOMEN: Distended positive bowel sounds 4 , no guarding or rebound   EXTREMITIES: No evidence of any cyanosis, clubbing, or peripheral edema.  +2 pedal and radial pulses bilaterally.  NEUROLOGIC: The patient is alert, awake, and oriented x3 with no focal motor or sensory deficits appreciated bilaterally.  SKINHe is jaundiced   Psych: Not anxious, depressed LN: No inguinal LN enlargement    Antibiotics   Anti-infectives    None      Medications   Scheduled Meds: . Chlorhexidine Gluconate Cloth  6 each Topical Q0600  . lactulose  30 g Oral TID  . LORazepam  0-4 mg Intravenous Q12H  . LORazepam  0-4 mg Oral 4 times per day  . LORazepam  0-4 mg Oral Q12H  . mupirocin ointment  1 application Nasal BID  . pantoprazole (PROTONIX) IV  40 mg Intravenous Q12H  . potassium chloride  40 mEq Oral Q4H  . prednisoLONE  40 mg Oral Daily  . thiamine  100 mg Intravenous Daily  . thiamine  100 mg Oral Daily   Continuous Infusions:   PRN Meds:.LORazepam, morphine injection, ondansetron **OR** ondansetron (ZOFRAN) IV   Data Review:   Micro Results Recent Results (from the past 240 hour(s))  MRSA PCR Screening     Status: Abnormal   Collection Time: 01/31/15  9:45 AM  Result Value Ref Range Status    MRSA by PCR POSITIVE (A) NEGATIVE Final    Comment:        The GeneXpert MRSA Assay (FDA approved for NASAL specimens only), is one component of a comprehensive MRSA colonization surveillance program. It is not intended to diagnose MRSA infection nor to guide or monitor treatment for MRSA infections. CRITICAL RESULT CALLED TO, READ BACK BY AND VERIFIED WITH: ERIN MAYNOR ON 01/31/15 AT 1137 BY QSD     Radiology Reports Ct Abdomen Pelvis W Contrast  01/30/2015   CLINICAL DATA:  Status post fall in bathtub, with persistent left flank pain and bruising. Pain radiates to the left lower quadrant and back. Recent jaundice and inability to urinate. Current history of cirrhosis. Initial encounter.  EXAM: CT ABDOMEN AND PELVIS WITH CONTRAST  TECHNIQUE: Multidetector CT imaging of the abdomen and pelvis was performed using the standard protocol following bolus administration of intravenous contrast.  CONTRAST:  OMNIPAQUE IOHEXOL 350 MG/ML SOLN  COMPARISON:  Right upper quadrant ultrasound performed 02/06/2013, and CT of the abdomen and pelvis from 02/10/2013  FINDINGS: The visualized lung bases are clear.  Extensive nodular heterogeneity is noted throughout the liver. This may reflect a combination of regenerative nodules and necrosis, though underlying malignancy cannot be excluded. The spleen is enlarged, measuring 17.2 cm in length. There is marked prominence of a recanalized umbilical vein, and collateral vessels along the anterior abdominal wall. Gastric, splenic and esophageal varices are seen.  The gallbladder is grossly unremarkable in appearance. Trace ascites is noted surrounding the liver and spleen. The pancreas and adrenal glands are grossly unremarkable.  The kidneys are unremarkable in appearance. There is no evidence of hydronephrosis. No renal or ureteral stones are seen. Mild nonspecific fluid is noted tracking along Gerota's fascia bilaterally.  The small bowel is unremarkable in  appearance. The stomach is within normal limits. No acute vascular abnormalities are seen.  Mild soft tissue injury is noted at the  left posterolateral flank, with minimal foci of hemorrhage.  The appendix is normal in caliber, without evidence of appendicitis.  There is mild diffuse wall thickening along much of the colon, with mild surrounding soft tissue inflammation. This may reflect an acute infectious or inflammatory process, or could be seen with hypoproteinemia. Would correlate for associated symptoms.  The bladder is mildly distended and grossly unremarkable. The prostate remains normal in size. Trace free fluid is noted within the pelvis. No inguinal lymphadenopathy is seen.  No acute osseous abnormalities are identified. 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  IMPRESSION: 1. Mild soft tissue injury noted at the left posterolateral flank, with minimal foci of soft tissue hemorrhage. 2. No additional evidence for traumatic injury to the abdomen or pelvis. 3. Extensive nodular heterogeneity throughout the liver. This may reflect a combination of regenerative nodules and necrosis in association with the patient's worsening cirrhosis, though underlying malignancy cannot be excluded. Would correlate with LFTs and consider dynamic liver protocol MRI or CT for further evaluation, as deemed clinically appropriate. 4. Mild diffuse wall thickening along much of the colon, with mild surrounding soft tissue inflammation. This could reflect an acute infectious or inflammatory process, or could be seen with hypoproteinemia. Would correlate for associated symptoms. 5. Splenomegaly noted. Marked prominence of a recanalized umbilical vein, and gastric, splenic and esophageal varices noted. 6. Trace ascites noted within the abdomen and pelvis. 7. Chronic bilateral pars defects at L5, without evidence of anterolisthesis.   Electronically Signed   By: Roanna Raider M.D.   On:  01/30/2015 20:54   Dg Chest Portable 1 View  01/30/2015   CLINICAL DATA:  Fall in bathtub yesterday  EXAM: PORTABLE CHEST - 1 VIEW  COMPARISON:  None.  FINDINGS: The heart size and mediastinal contours are within normal limits. Both lungs are clear. The visualized skeletal structures are unremarkable.  IMPRESSION: No active disease.   Electronically Signed   By: Alcide Clever M.D.   On: 01/30/2015 19:19   US Abdomen Limited Ruq  01/31/2015   CLINICAL DATA:  Alcoholic hepatitis  EXAM: US ABDOMEN LIMITED - RIGHT UPPER QUADRANT  COMPARISON:  CT 01/30/2015  FINDINGS: Gallbladder:  Dependent sludge or debris within the gallbladder, with trace pericholecystic fluid. Wall thickness 2 mm. No sonographic Murphy sign.  Common bile duct:  Diameter: 4 mm  Liver:  Hepatic increased echogenicity likely indicates underlying steatosis without focal abnormality or intrahepatic ductal dilatation.  Small ascites noted adjacent to the liver.  IMPRESSION: Dependent gallbladder sludge or debris with a small amount of pericholecystic fluid which could indicate acute cholecystitis although tracking of pericolonic ascites could appear similar.  Hepatic increased echogenicity likely indicates underlying steatosis without focal abnormality or intrahepatic ductal dilatation.   Electronically Signed   By: Christiana Pellant M.D.   On: 01/31/2015 09:34     CBC  Recent Labs Lab 01/30/15 1823 01/31/15 0338 01/31/15 0830 01/31/15 1437 01/31/15 2034 02/01/15 0934  WBC 12.1* 10.5 12.4* 9.7 8.7 8.0  HGB 10.9* 10.0* 10.0* 10.0* 9.6* 9.8*  HCT 30.2* 27.3* 28.5* 27.5* 27.0* 27.2*  PLT 61* 56* 59* 54* 61* 56*  MCV 100.5* 100.7* 101.6* 101.1* 102.1* 102.9*  MCH 36.1* 36.9* 35.7* 36.7* 36.4* 37.1*  MCHC 36.0 36.6* 35.2 36.3* 35.7 36.0  RDW 19.0* 20.0* 20.0* 20.4* 20.4* 21.1*  LYMPHSABS 0.6*  --   --   --   --   --   MONOABS 1.2*  --   --   --   --   --  EOSABS 0.1  --   --   --   --   --   BASOSABS 0.0  --   --   --   --   --      Chemistries   Recent Labs Lab 01/30/15 1823 01/31/15 0338 02/01/15 0417  NA 120* 124* 130*  K 3.2* 3.0* 3.2*  CL 84* 88* 94*  CO2 GLUCOSE 106* 101* 133*  BUN 5* 6 8  CREATININE SEE COMMENTS SEE COMMENTS SEE COMMENTS  CALCIUM 7.1* 7.2* 7.4*  AST 378* 328* 249*  ALT 75* 74* 63  ALKPHOS 184* 159* 143*  BILITOT 38.9* 40.2* 40.8*   ------------------------------------------------------------------------------------------------------------------ CrCl cannot be calculated (Patient has no serum creatinine result on file.). ------------------------------------------------------------------------------------------------------------------ No results for input(s): HGBA1C in the last 72 hours. ------------------------------------------------------------------------------------------------------------------ No results for input(s): CHOL, HDL, LDLCALC, TRIG, CHOLHDL, LDLDIRECT in the last 72 hours. ------------------------------------------------------------------------------------------------------------------ No results for input(s): TSH, T4TOTAL, T3FREE, THYROIDAB in the last 72 hours.  Invalid input(s): FREET3 ------------------------------------------------------------------------------------------------------------------ No results for input(s): VITAMINB12, FOLATE, FERRITIN, TIBC, IRON, RETICCTPCT in the last 72 hours.  Coagulation profile  Recent Labs Lab 01/30/15 1823 01/31/15 0338 02/01/15 0934  INR 1.66 1.61 1.80    No results for input(s): DDIMER in the last 72 hours.  Cardiac Enzymes  Recent Labs Lab 01/30/15 1823  TROPONINI <0.03   ------------------------------------------------------------------------------------------------------------------ Invalid input(s): POCBNP    Assessment & Plan   Principal Problem:  1. Alcoholic hepatitis;  continue prednisolone 40 mg daily, appreciate GI input, hepatitis panel pending Active Problems: 2.   Alcohol use: CIWA protocol 3. Melena : On Protonix which will continue to monitor, hemoglobin stable 4. 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 Hyponatremia Due to liver cirrhosis, currently stable 6. large hematoma on his trunk. Monitor his hemoglobin 7. Anemia Likely anemia of chronic disease due to his liver disease as well as some blood loss      Code Status Orders        Start     Ordered   01/31/15 0200  Full code   Continuous     01/31/15 0159        DVT Prophylaxis  SCDs   Lab Results  Component Value Date   PLT 56* 02/01/2015     Time Spent in minutes   35 minutes    Auburn Bilberry M.D on 02/01/2015 at 11:09 AM  Between 7am to 6pm - Pager - [phone]  After 6pm go to [email or web address] - password [email or web address] [facility]  [facility]   Office  [phone]

## 2015-02-01 NOTE — Progress Notes (Addendum)
MD requested that the pt be put on a low salt diet and discontinue lactulose.  He also requested that pt be NPO after midnight.  Dr Shelle Ironein will decide tomorrow if he will do an endoscopy on the pt tomorrow

## 2015-02-01 NOTE — Progress Notes (Addendum)
GI Inpatient Follow-up Note  Patient Identification: Dorion Q Arreaga is a 34 y.o. male with ETOH cirrhosis,  ETOH hepatitis.   Subjective:  No further dark stools.  Many orange stools today.  Mild RUQ pain.  No f/c. No n/v.   Scheduled Inpatient Medications:  . Chlorhexidine Gluconate Cloth  6 each Topical Q0600  . LORazepam  0-4 mg Intravenous Q12H  . LORazepam  0-4 mg Oral 4 times per day  . LORazepam  0-4 mg Oral Q12H  . mupirocin ointment  1 application Nasal BID  . pantoprazole (PROTONIX) IV  40 mg Intravenous Q12H  . prednisoLONE  40 mg Oral Daily  . thiamine  100 mg Intravenous Daily  . thiamine  100 mg Oral Daily    Continuous Inpatient Infusions:     PRN Inpatient Medications:  LORazepam, morphine injection, ondansetron **OR** ondansetron (ZOFRAN) IV    Physical Examination: BP 111/67 mmHg  Pulse 87  Temp(Src) 98.7 F (37.1 C) (Oral)  Resp 18  Ht 5' 8" (1.727 m)  Wt 222 lb 12.8 oz (101.061 kg)  BMI 33.88 kg/m2  SpO2 98% Gen: NAD, alert and oriented x 4 HEENT: PEERLA, EOMI, + icteric sclerae Neck: supple, no JVD or thyromegaly Chest: CTA bilaterally, no wheezes, crackles, or other adventitious sounds CV: RRR, no m/g/c/r Abd:+ some distension, decreased bwoel sounds, mild RUQ TTP Ext: some edema, well perfused with 2+ pulses, Skin: no rash or lesions noted Lymph: no LAD  Data: Lab Results  Component Value Date   WBC 8.0 02/01/2015   HGB 9.8* 02/01/2015   HCT 27.2* 02/01/2015   MCV 102.9* 02/01/2015   PLT 56* 02/01/2015    Recent Labs Lab 01/31/15 1437 01/31/15 2034 02/01/15 0934  HGB 10.0* 9.6* 9.8*   Lab Results  Component Value Date   NA 130* 02/01/2015   K 3.2* 02/01/2015   CL 94* 02/01/2015   CO2 27 02/01/2015   BUN 8 02/01/2015   CREATININE SEE COMMENTS 02/01/2015   Lab Results  Component Value Date   ALT 63 02/01/2015   AST 249* 02/01/2015   ALKPHOS 143* 02/01/2015   BILITOT 40.8* 02/01/2015    Recent Labs Lab  01/30/15 1823  02/01/15 0934  APTT 48*  --   --   INR 1.66  < > 1.80  < > = values in this interval not displayed.   Assessment/Plan: Mr. Paulos is a 34 y.o. male with ETOH CIrrhosis, ETOH hepatisis ( severe).   No further dark stools and Hgb has remained stable. CT scan shows mild diffuse colonic thickening. Ultrasound shows possible mild pericystic fluid which is likely secondary to liver inflammation  Recommendations: - cont prednisolone 40 mg daily for high discriminant function - avoid sedating meds - low Na diet ok - possible EGD Mon but more likely Tues.  NPO after mn.    Please call with questions or concerns.  Lilliauna Van GORDON, MD  Addendum:  D/c lactulose since moving bowels every hour.  

## 2015-02-01 NOTE — Progress Notes (Signed)
Patient slept most of shift except up to bathroom with stand by assist. Morphine iv given once for bilateral flank and abdominal pain. Jaundiced skin and bruising on right flank area . gait unsteady .lactulose po  given with good bm results. Clear liquid diet with hopes to advance per patient . cwai scale wnl this shift.family in for visit.

## 2015-02-01 NOTE — Progress Notes (Signed)
Continue to assess 

## 2015-02-02 LAB — COMPREHENSIVE METABOLIC PANEL
ALBUMIN: 2.3 g/dL — AB (ref 3.5–5.0)
ALK PHOS: 135 U/L — AB (ref 38–126)
ALT: 53 U/L (ref 17–63)
ANION GAP: 8 (ref 5–15)
AST: 169 U/L — ABNORMAL HIGH (ref 15–41)
BILIRUBIN TOTAL: 38.8 mg/dL — AB (ref 0.3–1.2)
BUN: 8 mg/dL (ref 6–20)
CO2: 27 mmol/L (ref 22–32)
Calcium: 7.5 mg/dL — ABNORMAL LOW (ref 8.9–10.3)
Chloride: 97 mmol/L — ABNORMAL LOW (ref 101–111)
Glucose, Bld: 98 mg/dL (ref 65–99)
Potassium: 3.5 mmol/L (ref 3.5–5.1)
Sodium: 132 mmol/L — ABNORMAL LOW (ref 135–145)
Total Protein: 6.1 g/dL — ABNORMAL LOW (ref 6.5–8.1)

## 2015-02-02 LAB — CBC
HCT: 25.8 % — ABNORMAL LOW (ref 40.0–52.0)
HEMOGLOBIN: 9.2 g/dL — AB (ref 13.0–18.0)
MCH: 37 pg — ABNORMAL HIGH (ref 26.0–34.0)
MCHC: 35.8 g/dL (ref 32.0–36.0)
MCV: 103.3 fL — ABNORMAL HIGH (ref 80.0–100.0)
PLATELETS: 69 10*3/uL — AB (ref 150–440)
RBC: 2.49 MIL/uL — ABNORMAL LOW (ref 4.40–5.90)
RDW: 21.3 % — AB (ref 11.5–14.5)
WBC: 9.2 10*3/uL (ref 3.8–10.6)

## 2015-02-02 MED ORDER — SODIUM CHLORIDE 0.9 % IV SOLN
INTRAVENOUS | Status: DC
  Administered 2015-02-03: 1000 mL via INTRAVENOUS

## 2015-02-02 MED ORDER — OXYCODONE HCL 5 MG PO TABS
5.0000 mg | ORAL_TABLET | Freq: Four times a day (QID) | ORAL | Status: DC | PRN
  Administered 2015-02-02: 10 mg via ORAL
  Administered 2015-02-02: 5 mg via ORAL
  Administered 2015-02-03 – 2015-02-07 (×13): 10 mg via ORAL
  Filled 2015-02-02 (×14): qty 2
  Filled 2015-02-02: qty 1
  Filled 2015-02-02: qty 2

## 2015-02-02 NOTE — Progress Notes (Signed)
Dr. Imogene Burnhen notified of patient having a bright red, bloody stool.

## 2015-02-02 NOTE — Clinical Social Work Note (Signed)
Clinical Social Work Assessment  Patient Details  Name: Derrick Blanchard MRN: [id number] Date of Birth: May 17, 1981  Date of referral:  02/02/15               Reason for consult:  Mental Health Concerns, Substance Use/ETOH Abuse, [employer], Financial Concerns, Legal Concerns                Permission sought to share information with:  Case Manager Permission granted to share information::  Yes, Verbal Permission Granted  Name::      Insurance account manager::   RN Case Manager  Relationship::     Contact Information:     Housing/Transportation Living arrangements for the past 2 months:  Single Family Home Source of Information:  Patient Patient Interpreter Needed:  None Criminal Activity/Legal Involvement Pertinent to Current Situation/Hospitalization:  No - Comment as needed (Patient has pending court case for child support, however it is not pertinent to this hospitalization ) Significant Relationships:  Parents Lives with:  Parents Do you feel safe going back to the place where you live?  Yes Need for family participation in patient care:  No (Coment)  Care giving concerns: Patient lives with his parents in [place].   Facilities manager / plan: Holiday representative ([place]) received verbal consult for substance abuse. CSW met with patient at bedside alone. CSW introduced self and explained role of CSW department. Patient was sitting up in bed eating breakfast. Patient made good eye contact and was pleasant. Patient reported that he lives in [place] with his mother and father that are in their late 53's and early 44's. Patient is a 34 year old male self pay patient. Patient reported that he did have a job in [employer] on a [employer], however his anxiety and alcohol use has kept him from working. Patient reported that he is feeling really bad and going throuhg alcohol withdrawal now. Patient reported that he has a son that he has not seen since he was 29 months old.  Patient does not know where his son lives. Patient reported that he is afraid that he is about to go to jail for not paying child support. Patient reported that he has a pending court case. CSW gave patient information for legal aide. Patient reported that he drinks around 2 fifths of liquor a day. Patient reported that he self medicates with alcohol to get relief from his anxiety. [name] reported that his anxiety is so bad that he can hardly ride in the car with his dad and feels like jumping out of the car. Patient reported that he has had trouble sleeping for the past 4 months. Per patient he also struggles with depression. Patient denied sucidial ideations. Patient reported that he thinks about hurting homself "once in a blue moon." but is not thinking about it now. Patient reported that the past year things have been going down hill. Patient reported that he has auditory halluciantions and will hear music, song lyrics, and people laughing. Patient reported that the auditory hallucianations started a couple of months ago. Patient denied visual hallicinations. Patient reported that he would like help with alcohol and would like medicaion for his anxiety.   CSW provided emotinal support. CSW requested MD to order psych consult. CSW also provided patient with [facility] including [facility], transportation reosuces and Scientist, research (life sciences). RN Case Freight forwarder provided med Psychologist, occupational.   Employment status:  Disabled (Comment on whether or not currently receiving Disability)  Insurance information:  Other (Comment Required) (Self- Pay) PT Recommendations:  Not assessed at this time Information / Referral to community resources:  Outpatient Psychiatric Care (Comment Required), Outpatient Substance Abuse Treatment Options  Patient/Family's Response to care: Patient is agreeable to seeing a psychiatrist while in the hospital. Plan is for patient to D/C home and follow up with outpatient resources.    Patient/Family's Understanding of and Emotional Response to Diagnosis, Current Treatment, and Prognosis: Patient was pleasant during visit and sitting up in the bed. Patient was open about his mental health symptoms and alcohol use. Patient is interested in outpatient resources for his anxiety and alcohol use. Patient thanked CSW for visit and providing resources. Please reconsult if future social work needs arise. CSW signing off.   Emotional Assessment Appearance:  Appears stated age Attitude/Demeanor/Rapport:  Lethargic Affect (typically observed):  Anxious, Quiet Orientation:  Oriented to Self, Oriented to Place, Oriented to  Time, Oriented to Situation Alcohol / Substance use:  Alcohol Use Psych involvement (Current and /or in the community):  Yes (Comment) (CSW requested psych consult)  Discharge Needs  Concerns to be addressed:  Coping/Stress Concerns, Financial / Insurance Concerns, Mental Health Concerns, Medication Concerns, Substance Abuse Concerns Readmission within the last 30 days:  No Current discharge risk:  Inadequate Financial Supports, Legal Concerns, Psychiatric Illness, Substance Abuse Barriers to Discharge:  Active Substance Use, Continued Medical Work up, Inadequate or no insurance   Derrick Blanchard, LCSWA [phone]    Elwyn Reach 02/02/2015, 11:29 AM

## 2015-02-02 NOTE — Care Management (Signed)
Patient sitting in bed; eating. Derrick MareBailey Child psychotherapistsocial worker currently meeting with patient. Application to [facility] left with patient and to Medication Management. He states [place] in [place] and has no PCP. Based on his diagnosis patient may need an [facility]

## 2015-02-02 NOTE — Progress Notes (Signed)
Dr. Allena KatzPatel notified per patient request for po pain medication. Oxycodone 5-10 mg q6h prn.

## 2015-02-02 NOTE — Consult Note (Signed)
  Pt seen in his room with RN. S Pt is a 34 yr old white male last worked in 2015. Single and lives with parents. Pt has been drinking alcohol on a regular basis for 6 to 7 hrs and currently drinks at the rate of Fifth and a half a day. Arrested for PD once and charges are dropped. Smokes THC about 2 blunts a day for many yrs. No other drugs. Past psych history/ No H/O inpt to psychiatry. No H/O suicide attempts. M.S Alert and ox3. Appears anxious. Denies feeling depressed. Denies feeling H/h or w/u about himself. No psychosis. Denies s/h ideas or plans. I/j guarded Imp Alcohol dependence chronic continous. Substance Induced anxiety. REc Continue Detox protocol. Add  Haldol  0.5 mgs po bid for anxiety. If pt still wants to get help for his Substance abuse problems ask for Social Service Consult at time of discharge For availability of various programs in the [place]. Pt expalined

## 2015-02-02 NOTE — Progress Notes (Signed)
Patient NPO for possible endoscopy. Resting well. Unsteady gait. resited iv. Morphine iv x 1 cwai scale wnl family in for visit. Patient more alert this shift.

## 2015-02-02 NOTE — Progress Notes (Signed)
Total bilirubin called to Dr. Betti Cruzeddy level 38.8

## 2015-02-02 NOTE — Progress Notes (Signed)
GI Note:  Hgb down just slightly today.  No anesthesia availability today.   Given decompensated cirrhosis with heavy ETOH use, will plan for EGD tomorrow with anesthesia.

## 2015-02-02 NOTE — Progress Notes (Signed)
[facility] - [place] at [facility]                                                                                                                                                                                            Patient Demographics   Derrick Blanchard, is a 34 y.o. male, DOB - 1981-04-18, VWU:[id number]  Admit date - 01/30/2015   Admitting Physician Crissie Figures, MD  Outpatient Primary MD for the patient is No primary care provider on file.  LOS - 3  Chief Complaint  Patient presents with  . Abdominal Pain  . Jaundice      Patient still has pain of the right upper quadrant of his abdomen. Denies any nausea or vomiting nothing by mouth for possible EGD today  Review of Systems:   CONSTITUTIONAL: No documented fever. No fatigue, weakness. No weight gain, no weight loss.  EYES: No blurry or double vision.  ENT: No tinnitus. No postnasal drip. No redness of the oropharynx.  RESPIRATORY: No cough, no wheeze, no hemoptysis. No dyspnea.  CARDIOVASCULAR: No chest pain. No orthopnea. No palpitations. No syncope.  GASTROINTESTINAL: Positive nausea, no vomiting or diarrhea. Right abdominal pain. Dark color stools. Positive Jaundice GENITOURINARY: No dysuria or hematuria.  ENDOCRINE: No polyuria or nocturia. No heat or cold intolerance.  HEMATOLOGY: No anemia. No bruising. No bleeding.  INTEGUMENTARY: No rashes. No lesions. Jaundice  MUSCULOSKELETAL: No arthritis. No swelling. No gout.  NEUROLOGIC: No numbness, tingling, or ataxia. No seizure-type activity.  PSYCHIATRIC: No anxiety. No insomnia. No ADD.    Vitals:   Filed Vitals:   02/02/15 0419 02/02/15 0500 02/02/15 0737 02/02/15 0745  BP: 115/66  91/38 110/70  Pulse: 94     Temp: 98.2 F (36.8 C)  99 F (37.2 C)   TempSrc: Oral  Oral   Resp: 17  16   Height:      Weight:  101.197 kg (223 lb 1.6 oz)    SpO2: 99%  97%     Wt Readings from Last 3 Encounters:  02/02/15 101.197 kg (223 lb  1.6 oz)     Intake/Output Summary (Last 24 hours) at 02/02/15 1009 Last data filed at 02/01/15 1350  Gross per 24 hour  Intake   1400 ml  Output      0 ml  Net   1400 ml    Physical Exam:   GENERAL: Chronically ill-appearing jaundiced HEAD, EYES, EARS, NOSE AND THROAT: Atraumatic, normocephalic. Extraocular muscles are intact. Pupils equal and reactive to light. Sclerae anicteric. No conjunctival injection. No oro-pharyngeal erythema.  NECK: Supple. There is no jugular  venous distention. No bruits, no lymphadenopathy, no thyromegaly.  HEART: Regular rate and rhythm, tachycardic. No murmurs, no rubs, no clicks.  LUNGS: Clear to auscultation bilaterally. No rales or rhonchi. No wheezes.  ABDOMEN: Distended positive bowel sounds 4 , no guarding or rebound   EXTREMITIES: No evidence of any cyanosis, clubbing, or peripheral edema.  +2 pedal and radial pulses bilaterally.  NEUROLOGIC: The patient is alert, awake, and oriented x3 with no focal motor or sensory deficits appreciated bilaterally.  SKINHe is jaundiced   Psych: Not anxious, depressed LN: No inguinal LN enlargement    Antibiotics   Anti-infectives    None      Medications   Scheduled Meds: . Chlorhexidine Gluconate Cloth  6 each Topical Q0600  . LORazepam  0-4 mg Oral Q12H  . mupirocin ointment  1 application Nasal BID  . pantoprazole (PROTONIX) IV  40 mg Intravenous Q12H  . prednisoLONE  40 mg Oral Daily  . thiamine  100 mg Intravenous Daily  . thiamine  100 mg Oral Daily   Continuous Infusions:   PRN Meds:.LORazepam, morphine injection, ondansetron **OR** ondansetron (ZOFRAN) IV   Data Review:   Micro Results Recent Results (from the past 240 hour(s))  MRSA PCR Screening     Status: Abnormal   Collection Time: 01/31/15  9:45 AM  Result Value Ref Range Status   MRSA by PCR POSITIVE (A) NEGATIVE Final    Comment:        The GeneXpert MRSA Assay (FDA approved for NASAL specimens only), is one component  of a comprehensive MRSA colonization surveillance program. It is not intended to diagnose MRSA infection nor to guide or monitor treatment for MRSA infections. CRITICAL RESULT CALLED TO, READ BACK BY AND VERIFIED WITH: ERIN MAYNOR ON 01/31/15 AT 1137 BY QSD     Radiology Reports Ct Abdomen Pelvis W Contrast  01/30/2015   CLINICAL DATA:  Status post fall in bathtub, with persistent left flank pain and bruising. Pain radiates to the left lower quadrant and back. Recent jaundice and inability to urinate. Current history of cirrhosis. Initial encounter.  EXAM: CT ABDOMEN AND PELVIS WITH CONTRAST  TECHNIQUE: Multidetector CT imaging of the abdomen and pelvis was performed using the standard protocol following bolus administration of intravenous contrast.  CONTRAST:  100mL OMNIPAQUE IOHEXOL 350 MG/ML SOLN  COMPARISON:  Right upper quadrant ultrasound performed 02/06/2013, and CT of the abdomen and pelvis from 02/10/2013  FINDINGS: The visualized lung bases are clear.  Extensive nodular heterogeneity is noted throughout the liver. This may reflect a combination of regenerative nodules and necrosis, though underlying malignancy cannot be excluded. The spleen is enlarged, measuring 17.2 cm in length. There is marked prominence of a recanalized umbilical vein, and collateral vessels along the anterior abdominal wall. Gastric, splenic and esophageal varices are seen.  The gallbladder is grossly unremarkable in appearance. Trace ascites is noted surrounding the liver and spleen. The pancreas and adrenal glands are grossly unremarkable.  The kidneys are unremarkable in appearance. There is no evidence of hydronephrosis. No renal or ureteral stones are seen. Mild nonspecific fluid is noted tracking along Gerota's fascia bilaterally.  The small bowel is unremarkable in appearance. The stomach is within normal limits. No acute vascular abnormalities are seen.  Mild soft tissue injury is noted at the left posterolateral  flank, with minimal foci of hemorrhage.  The appendix is normal in caliber, without evidence of appendicitis.  There is mild diffuse wall thickening along much of the colon, with  mild surrounding soft tissue inflammation. This may reflect an acute infectious or inflammatory process, or could be seen with hypoproteinemia. Would correlate for associated symptoms.  The bladder is mildly distended and grossly unremarkable. The prostate remains normal in size. Trace free fluid is noted within the pelvis. No inguinal lymphadenopathy is seen.  No acute osseous abnormalities are identified. 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  IMPRESSION: 1. Mild soft tissue injury noted at the left posterolateral flank, with minimal foci of soft tissue hemorrhage. 2. No additional evidence for traumatic injury to the abdomen or pelvis. 3. Extensive nodular heterogeneity throughout the liver. This may reflect a combination of regenerative nodules and necrosis in association with the patient's worsening cirrhosis, though underlying malignancy cannot be excluded. Would correlate with LFTs and consider dynamic liver protocol MRI or CT for further evaluation, as deemed clinically appropriate. 4. Mild diffuse wall thickening along much of the colon, with mild surrounding soft tissue inflammation. This could reflect an acute infectious or inflammatory process, or could be seen with hypoproteinemia. Would correlate for associated symptoms. 5. Splenomegaly noted. Marked prominence of a recanalized umbilical vein, and gastric, splenic and esophageal varices noted. 6. Trace ascites noted within the abdomen and pelvis. 7. Chronic bilateral pars defects at L5, without evidence of anterolisthesis.   Electronically Signed   By: Roanna Raider M.D.   On: 01/30/2015 20:54   Dg Chest Portable 1 View  01/30/2015   CLINICAL DATA:  Fall in bathtub yesterday  EXAM: PORTABLE CHEST - 1 VIEW  COMPARISON:  None.   FINDINGS: The heart size and mediastinal contours are within normal limits. Both lungs are clear. The visualized skeletal structures are unremarkable.  IMPRESSION: No active disease.   Electronically Signed   By: Alcide Clever M.D.   On: 01/30/2015 19:19   US Abdomen Limited Ruq  01/31/2015   CLINICAL DATA:  Alcoholic hepatitis  EXAM: US ABDOMEN LIMITED - RIGHT UPPER QUADRANT  COMPARISON:  CT 01/30/2015  FINDINGS: Gallbladder:  Dependent sludge or debris within the gallbladder, with trace pericholecystic fluid. Wall thickness 2 mm. No sonographic Murphy sign.  Common bile duct:  Diameter: 4 mm  Liver:  Hepatic increased echogenicity likely indicates underlying steatosis without focal abnormality or intrahepatic ductal dilatation.  Small ascites noted adjacent to the liver.  IMPRESSION: Dependent gallbladder sludge or debris with a small amount of pericholecystic fluid which could indicate acute cholecystitis although tracking of pericolonic ascites could appear similar.  Hepatic increased echogenicity likely indicates underlying steatosis without focal abnormality or intrahepatic ductal dilatation.   Electronically Signed   By: Christiana Pellant M.D.   On: 01/31/2015 09:34     CBC  Recent Labs Lab 01/30/15 1823  01/31/15 0830 01/31/15 1437 01/31/15 2034 02/01/15 0934 02/02/15 0356  WBC 12.1*  < > 12.4* 9.7 8.7 8.0 9.2  HGB 10.9*  < > 10.0* 10.0* 9.6* 9.8* 9.2*  HCT 30.2*  < > 28.5* 27.5* 27.0* 27.2* 25.8*  PLT 61*  < > 59* 54* 61* 56* 69*  MCV 100.5*  < > 101.6* 101.1* 102.1* 102.9* 103.3*  MCH 36.1*  < > 35.7* 36.7* 36.4* 37.1* 37.0*  MCHC 36.0  < > 35.2 36.3* 35.7 36.0 35.8  RDW 19.0*  < > 20.0* 20.4* 20.4* 21.1* 21.3*  LYMPHSABS 0.6*  --   --   --   --   --   --   MONOABS 1.2*  --   --   --   --   --   --  EOSABS 0.1  --   --   --   --   --   --   BASOSABS 0.0  --   --   --   --   --   --   < > = values in this interval not displayed.  Chemistries   Recent Labs Lab 01/30/15 1823  01/31/15 0338 02/01/15 0417 02/02/15 0356  NA 120* 124* 130* 132*  K 3.2* 3.0* 3.2* 3.5  CL 84* 88* 94* 97*  CO2 GLUCOSE 106* 101* 133* 98  BUN 5* CREATININE SEE COMMENTS SEE COMMENTS SEE COMMENTS SEE COMMENTS  CALCIUM 7.1* 7.2* 7.4* 7.5*  AST 378* 328* 249* 169*  ALT 75* 74* 63 53  ALKPHOS 184* 159* 143* 135*  BILITOT 38.9* 40.2* 40.8* 38.8*   ------------------------------------------------------------------------------------------------------------------ CrCl cannot be calculated (Patient has no serum creatinine result on file.). ------------------------------------------------------------------------------------------------------------------ No results for input(s): HGBA1C in the last 72 hours. ------------------------------------------------------------------------------------------------------------------ No results for input(s): CHOL, HDL, LDLCALC, TRIG, CHOLHDL, LDLDIRECT in the last 72 hours. ------------------------------------------------------------------------------------------------------------------ No results for input(s): TSH, T4TOTAL, T3FREE, THYROIDAB in the last 72 hours.  Invalid input(s): FREET3 ------------------------------------------------------------------------------------------------------------------ No results for input(s): VITAMINB12, FOLATE, FERRITIN, TIBC, IRON, RETICCTPCT in the last 72 hours.  Coagulation profile  Recent Labs Lab 01/30/15 1823 01/31/15 0338 02/01/15 0934  INR 1.66 1.61 1.80    No results for input(s): DDIMER in the last 72 hours.  Cardiac Enzymes  Recent Labs Lab 01/30/15 1823  TROPONINI <0.03   ------------------------------------------------------------------------------------------------------------------ Invalid input(s): POCBNP    Assessment & Plan   Principal Problem:  1. Alcoholic hepatitis;  continue prednisolone 40 mg daily, appreciate GI input, hepatitis panel pending,  2.   Alcohol use: CIWA protocol 3. Melena : On Protonix which will continue to monitor, hemoglobin stable EGD planned today or tomorrow 4. 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 Hyponatremia Due to liver cirrhosis, currently stable 6. large hematoma on his trunk. Monitor his hemoglobin 7. Anemia Likely anemia of chronic disease due to his liver disease as well as some blood loss 8. Right upper quadrant abdominal pain, with ultrasound of the abdomen suggestive of possible gallbladder and inflammation however this is likely due to free fluid from his liver disease however due to his right upper quadrant abdominal pain will get a HIDA scan.      Code Status Orders        Start     Ordered   01/31/15 0200  Full code   Continuous     01/31/15 0159        DVT Prophylaxis  SCDs   Lab Results  Component Value Date   PLT 69* 02/02/2015     Time Spent in minutes   35 minutes    Auburn Bilberry M.D on 02/02/2015 at 10:09 AM  Between 7am to 6pm - Pager - [phone]  After 6pm go to [email or web address] - password [email or web address] [facility]  [facility]   Office  [phone]

## 2015-02-03 ENCOUNTER — Inpatient Hospital Stay: Payer: Medicaid Other | Admitting: Anesthesiology

## 2015-02-03 ENCOUNTER — Encounter: Payer: Self-pay | Admitting: *Deleted

## 2015-02-03 ENCOUNTER — Encounter
Admission: EM | Disposition: A | Discharge status: Home / Self Care | Payer: Self-pay | Source: Home / Self Care | Attending: Internal Medicine

## 2015-02-03 DIAGNOSIS — F1020 Alcohol dependence, uncomplicated: Secondary | ICD-10-CM

## 2015-02-03 HISTORY — PX: ESOPHAGOGASTRODUODENOSCOPY (EGD) WITH PROPOFOL: SHX5813

## 2015-02-03 LAB — CBC
HEMATOCRIT: 27 % — AB (ref 40.0–52.0)
Hemoglobin: 9.7 g/dL — ABNORMAL LOW (ref 13.0–18.0)
MCH: 37.6 pg — ABNORMAL HIGH (ref 26.0–34.0)
MCHC: 35.8 g/dL (ref 32.0–36.0)
MCV: 105 fL — ABNORMAL HIGH (ref 80.0–100.0)
Platelets: 88 10*3/uL — ABNORMAL LOW (ref 150–440)
RBC: 2.58 MIL/uL — ABNORMAL LOW (ref 4.40–5.90)
RDW: 22.3 % — AB (ref 11.5–14.5)
WBC: 10.1 10*3/uL (ref 3.8–10.6)

## 2015-02-03 LAB — COMPREHENSIVE METABOLIC PANEL
ALBUMIN: 2.6 g/dL — AB (ref 3.5–5.0)
ALBUMIN: 2.3 g/dL — AB (ref 3.5–5.0)
ALT: 74 U/L — AB (ref 17–63)
ALT: 57 U/L (ref 17–63)
AST: 328 U/L — ABNORMAL HIGH (ref 15–41)
AST: 140 U/L — ABNORMAL HIGH (ref 15–41)
Alkaline Phosphatase: 159 U/L — ABNORMAL HIGH (ref 38–126)
Alkaline Phosphatase: 123 U/L (ref 38–126)
Anion gap: 12 (ref 5–15)
Anion gap: 8 (ref 5–15)
BILIRUBIN TOTAL: 40.2 mg/dL — AB (ref 0.3–1.2)
BUN: 6 mg/dL (ref 6–20)
BUN: 11 mg/dL (ref 6–20)
CALCIUM: 7.2 mg/dL — AB (ref 8.9–10.3)
CO2: 24 mmol/L (ref 22–32)
CO2: 27 mmol/L (ref 22–32)
Calcium: 7.3 mg/dL — ABNORMAL LOW (ref 8.9–10.3)
Chloride: 88 mmol/L — ABNORMAL LOW (ref 101–111)
Chloride: 96 mmol/L — ABNORMAL LOW (ref 101–111)
GLUCOSE: 101 mg/dL — AB (ref 65–99)
GLUCOSE: 93 mg/dL (ref 65–99)
Potassium: 3 mmol/L — ABNORMAL LOW (ref 3.5–5.1)
Potassium: 3.2 mmol/L — ABNORMAL LOW (ref 3.5–5.1)
SODIUM: 124 mmol/L — AB (ref 135–145)
Sodium: 131 mmol/L — ABNORMAL LOW (ref 135–145)
TOTAL PROTEIN: 6.7 g/dL (ref 6.5–8.1)
Total Bilirubin: 39.7 mg/dL (ref 0.3–1.2)
Total Protein: 6.2 g/dL — ABNORMAL LOW (ref 6.5–8.1)

## 2015-02-03 SURGERY — ESOPHAGOGASTRODUODENOSCOPY (EGD) WITH PROPOFOL
Anesthesia: General

## 2015-02-03 MED ORDER — PROPOFOL 10 MG/ML IV BOLUS
INTRAVENOUS | Status: DC | PRN
  Administered 2015-02-03: 50 mg via INTRAVENOUS

## 2015-02-03 MED ORDER — FENTANYL CITRATE (PF) 100 MCG/2ML IJ SOLN
INTRAMUSCULAR | Status: DC | PRN
  Administered 2015-02-03: 50 ug via INTRAVENOUS

## 2015-02-03 MED ORDER — LIDOCAINE HCL (CARDIAC) 20 MG/ML IV SOLN
INTRAVENOUS | Status: DC | PRN
  Administered 2015-02-03: 100 mg via INTRAVENOUS

## 2015-02-03 MED ORDER — GLYCOPYRROLATE 0.2 MG/ML IJ SOLN
INTRAMUSCULAR | Status: DC | PRN
  Administered 2015-02-03: 0.2 mg via INTRAVENOUS

## 2015-02-03 MED ORDER — PANTOPRAZOLE SODIUM 40 MG PO TBEC
40.0000 mg | DELAYED_RELEASE_TABLET | Freq: Every day | ORAL | Status: DC
  Administered 2015-02-03 – 2015-02-07 (×5): 40 mg via ORAL
  Filled 2015-02-03 (×5): qty 1

## 2015-02-03 MED ORDER — PROPOFOL INFUSION 10 MG/ML OPTIME
INTRAVENOUS | Status: DC | PRN
  Administered 2015-02-03: 140 ug/kg/min via INTRAVENOUS

## 2015-02-03 NOTE — Interval H&P Note (Signed)
History and Physical Interval Note:  02/03/2015 8:54 AM  Derrick Blanchard  has presented today for surgery, with the diagnosis of melena  The various methods of treatment have been discussed with the patient and family. After consideration of risks, benefits and other options for treatment, the patient has consented to  Procedure(s): ESOPHAGOGASTRODUODENOSCOPY (EGD) WITH PROPOFOL (N/A) as a surgical intervention .  The patient's history has been reviewed, patient examined, no change in status, stable for surgery.  I have reviewed the patient's chart and labs.  Questions were answered to the patient's satisfaction.     [name] GORDON

## 2015-02-03 NOTE — Progress Notes (Signed)
UP WITH ASSIST TO BATHROOM. 3 LOOSE FORMED STOOLS TODAY CHALKY COLOR  WITH BLOOD. GOOD APPETITE. REMAINS JAUNDICED AND ECCHYMOTIC. TOLERATED EGD WITHOUT INCDIENT. BP REMAINS LOW BUT ASYMTOMATIC

## 2015-02-03 NOTE — Progress Notes (Signed)
Pt. Alert and oriented. VSS. Up to bathroom with stand-by assist. Pain in flank area controlled with meds per MAR. Pt. Had BM with some bright red blood in it tonight. Pt. Still undecided whether or not he's getting his endoscopy today. Consent on chart unsigned. Resting quietly at this time.

## 2015-02-03 NOTE — Op Note (Signed)
[facility] Patient Name: Derrick Blanchard Procedure Date: 02/03/2015 8:52 AM MRN: [id number] Account #: [id number] Date of Birth: Apr 21, 1981 Admit Type: Inpatient Age: 34 Room: [facility] ENDO ROOM 1 Gender: Male Note Status: Finalized Procedure:         Upper GI endoscopy Indications:       Anemia, Melena, Cirrhosis rule out esophageal varices Patient Profile:   This is a 34 year old male. Providers:         Rhona Raider. Shelle Iron, MD Referring MD:      No Local Md, MD (Referring MD) Medicines:         Propofol per Anesthesia Complications:     No immediate complications. Procedure:         Pre-Anesthesia Assessment:                    - Prior to the procedure, a History and Physical was                     performed, and patient medications and allergies were                     reviewed. The patient is competent. The risks and benefits                     of the procedure and the sedation options and risks were                     discussed with the patient. All questions were answered                     and informed consent was obtained. Patient identification                     and proposed procedure were verified by the physician and                     the nurse in the pre-procedure area. Mental Status                     Examination: alert and oriented. Airway Examination:                     normal oropharyngeal airway and neck mobility. Respiratory                     Examination: clear to auscultation. CV Examination: RRR,                     no murmurs, no S3 or S4. Prophylactic Antibiotics: The                     patient does not require prophylactic antibiotics. Prior                     Anticoagulants: The patient has taken no previous                     anticoagulant or antiplatelet agents. ASA Grade                     Assessment: III - A patient with severe systemic disease.                     After reviewing the  risks and benefits,  the patient was                     deemed in satisfactory condition to undergo the procedure.                     The anesthesia plan was to use monitored anesthesia care                     (MAC). Immediately prior to administration of medications,                     the patient was re-assessed for adequacy to receive                     sedatives. The heart rate, respiratory rate, oxygen                     saturations, blood pressure, adequacy of pulmonary                     ventilation, and response to care were monitored                     throughout the procedure. The physical status of the                     patient was re-assessed after the procedure.                    - Prior to the procedure, a History and Physical was                     performed, and patient medications, allergies and                     sensitivities were reviewed. The patient's tolerance of                     previous anesthesia was reviewed.                    After obtaining informed consent, the endoscope was passed                     under direct vision. Throughout the procedure, the                     patient's blood pressure, pulse, and oxygen saturations                     were monitored continuously. The was introduced through                     the mouth, with the intention of advancing to the stomach.                     The scope was advanced to the gastric body before the                     procedure was aborted due to food in stomach Medications                     were not given. The upper GI endoscopy was accomplished  without difficulty. The patient tolerated the procedure. Findings:      The esophagus was normal.      A large amount of food (residue) was found in the gastric body.      - Procdure aborted in stomach due to large amount of food. Impression:        - Normal esophagus.                    - A large amount of food (residue) in the stomach.                     - No specimens collected. Recommendation:    - Observe patient in GI recovery unit.                    - Low sodium diet.                    - Continue present medications.                    - Repeat the upper endoscopy at appointment to be                     scheduled because the bowel preparation was poor. Use 24                     hours of clear liquids. Can be done be done as outpatient                     since no varices seen.                    - Return to GI clinic.                    - The findings and recommendations were discussed with the                     patient.                    - The findings and recommendations were discussed with the                     patient's family. Procedure Code(s): --- Professional ---                    [phone], 52, Esophagogastroduodenoscopy, flexible,                     transoral; diagnostic, including collection of specimen(s)                     by brushing or washing, when performed (separate procedure) CPT copyright 2014 American Medical Association. All rights reserved. The codes documented in this report are preliminary and upon coder review may  be revised to meet current compliance requirements. Kathalene Frames, MD 02/03/2015 9:32:23 AM This report has been signed electronically. Number of Addenda: 0 Note Initiated On: 02/03/2015 8:52 AM      [facility]

## 2015-02-03 NOTE — H&P (View-Only) (Signed)
GI Inpatient Follow-up Note  Patient Identification: Derrick Blanchard is a 34 y.o. male with ETOH cirrhosis,  ETOH hepatitis.   Subjective:  No further dark stools.  Many orange stools today.  Mild RUQ pain.  No f/c. No n/v.   Scheduled Inpatient Medications:  . Chlorhexidine Gluconate Cloth  6 each Topical Q0600  . LORazepam  0-4 mg Intravenous Q12H  . LORazepam  0-4 mg Oral 4 times per day  . LORazepam  0-4 mg Oral Q12H  . mupirocin ointment  1 application Nasal BID  . pantoprazole (PROTONIX) IV  40 mg Intravenous Q12H  . prednisoLONE  40 mg Oral Daily  . thiamine  100 mg Intravenous Daily  . thiamine  100 mg Oral Daily    Continuous Inpatient Infusions:     PRN Inpatient Medications:  LORazepam, morphine injection, ondansetron **OR** ondansetron (ZOFRAN) IV    Physical Examination: BP 111/67 mmHg  Pulse 87  Temp(Src) 98.7 F (37.1 C) (Oral)  Resp 18  Ht 5\' 8"  (1.727 m)  Wt 222 lb 12.8 oz (101.061 kg)  BMI 33.88 kg/m2  SpO2 98% Gen: NAD, alert and oriented x 4 HEENT: PEERLA, EOMI, + icteric sclerae Neck: supple, no JVD or thyromegaly Chest: CTA bilaterally, no wheezes, crackles, or other adventitious sounds CV: RRR, no m/g/c/r Abd:+ some distension, decreased bwoel sounds, mild RUQ TTP Ext: some edema, well perfused with 2+ pulses, Skin: no rash or lesions noted Lymph: no LAD  Data: Lab Results  Component Value Date   WBC 8.0 02/01/2015   HGB 9.8* 02/01/2015   HCT 27.2* 02/01/2015   MCV 102.9* 02/01/2015   PLT 56* 02/01/2015    Recent Labs Lab 01/31/15 1437 01/31/15 2034 02/01/15 0934  HGB 10.0* 9.6* 9.8*   Lab Results  Component Value Date   NA 130* 02/01/2015   K 3.2* 02/01/2015   CL 94* 02/01/2015   CO2 27 02/01/2015   BUN 8 02/01/2015   CREATININE SEE COMMENTS 02/01/2015   Lab Results  Component Value Date   ALT 63 02/01/2015   AST 249* 02/01/2015   ALKPHOS 143* 02/01/2015   BILITOT 40.8* 02/01/2015    Recent Labs Lab  01/30/15 1823  02/01/15 0934  APTT 48*  --   --   INR 1.66  < > 1.80  < > = values in this interval not displayed.   Assessment/Plan: Derrick Blanchard is a 34 y.o. male with ETOH CIrrhosis, ETOH hepatisis ( severe).   No further dark stools and Hgb has remained stable. CT scan shows mild diffuse colonic thickening. Ultrasound shows possible mild pericystic fluid which is likely secondary to liver inflammation  Recommendations: - cont prednisolone 40 mg daily for high discriminant function - avoid sedating meds - low Na diet ok - possible EGD Mon but more likely Tues.  NPO after mn.    Please call with questions or concerns.  Advika Mclelland, Addison NaegeliMATTHEW GORDON, MD  Addendum:  D/c lactulose since moving bowels every hour.

## 2015-02-03 NOTE — Anesthesia Preprocedure Evaluation (Signed)
Anesthesia Evaluation  Patient identified by MRN, date of birth, ID band Patient awake    Reviewed: Allergy & Precautions, NPO status , Patient's Chart, lab work & pertinent test results  History of Anesthesia Complications Negative for: history of anesthetic complications  Airway Mallampati: II  TM Distance: >3 FB Neck ROM: Full    Dental no notable dental hx.    Pulmonary Current Smoker,  Smokes THC and cigarettes breath sounds clear to auscultation  Pulmonary exam normal       Cardiovascular Exercise Tolerance: Poor negative cardio ROS Normal cardiovascular examRhythm:Regular Rate:Normal     Neuro/Psych Anxiety negative neurological ROS  negative psych ROS   GI/Hepatic negative GI ROS, Neg liver ROS, GERD-  Medicated,(+) Cirrhosis -      , Hepatitis -  Endo/Other  negative endocrine ROS  Renal/GU negative Renal ROS  negative genitourinary   Musculoskeletal negative musculoskeletal ROS (+)   Abdominal   Peds negative pediatric ROS (+)  Hematology  (+) anemia , thrombocytopenia   Anesthesia Other Findings   Reproductive/Obstetrics negative OB ROS                             Anesthesia Physical Anesthesia Plan  ASA: III  Anesthesia Plan: General   Post-op Pain Management:    Induction: Intravenous  Airway Management Planned: Nasal Cannula  Additional Equipment:   Intra-op Plan:   Post-operative Plan:   Informed Consent: I have reviewed the patients History and Physical, chart, labs and discussed the procedure including the risks, benefits and alternatives for the proposed anesthesia with the patient or authorized representative who has indicated his/her understanding and acceptance.   Dental advisory given  Plan Discussed with: CRNA and Surgeon  Anesthesia Plan Comments:         Anesthesia Quick Evaluation

## 2015-02-03 NOTE — Transfer of Care (Signed)
Immediate Anesthesia Transfer of Care Note  Patient: Derrick Blanchard  Procedure(s) Performed: Procedure(s): ESOPHAGOGASTRODUODENOSCOPY (EGD) WITH PROPOFOL (N/A)  Patient Location: Endoscopy Unit  Anesthesia Type:General  Level of Consciousness: sedated  Airway & Oxygen Therapy: Patient Spontanous Breathing and Patient connected to nasal cannula oxygen  Post-op Assessment: Report given to RN and Post -op Vital signs reviewed and stable  Post vital signs: Reviewed and stable  Last Vitals:  Filed Vitals:   02/03/15 0933  BP: 87/45  Pulse: 80  Temp:   Resp: 31    Complications: No apparent anesthesia complications

## 2015-02-03 NOTE — Anesthesia Postprocedure Evaluation (Signed)
  Anesthesia Post-op Note  Patient: Derrick HissChristopher Q Digman  Procedure(s) Performed: Procedure(s): ESOPHAGOGASTRODUODENOSCOPY (EGD) WITH PROPOFOL (N/A)  Anesthesia type:General  Patient location: PACU  Post pain: Pain level controlled  Post assessment: Post-op Vital signs reviewed, Patient's Cardiovascular Status Stable, Respiratory Function Stable, Patent Airway and No signs of Nausea or vomiting  Post vital signs: Reviewed and stable  Last Vitals:  Filed Vitals:   02/03/15 0950  BP: 91/59  Pulse: 81  Temp:   Resp: 27    Level of consciousness: awake, alert  and patient cooperative  Complications: No apparent anesthesia complications

## 2015-02-03 NOTE — Consult Note (Signed)
  Psychiatry: Follow-up for this 34 year old man with a history of heavy alcohol use as well as alcoholic hepatitis/cirrhosis.  Patient came into the hospital with a blood alcohol level over 400. He tells me that he had been back to heavy drinking for about 3 months. Prior to that he had stayed sober for a few months but he never manages more than few months at a time of staying sober. He had not been involved in any kind of substance abuse treatment outside the hospital.  On interview today the patient denies hallucinations. Feels mildly anxious. Denies feeling depressed denies suicidal ideation. He does not appear to be delirious. He is still having some nausea and physical discomfort.  On mental status he is awake alert and oriented. Displays an understanding of his illness and the workup. Good eye contact. Blunted affect. Says his mood gets down from time to time but not severely depressed denies suicidal thought. No evidence of psychosis doesn't appear to be delirious.  This patient's alcohol abuse is clearly life threatening. He has been told in the past that he will die if he continues drinking. I reiterated that to him today explaining the several ways in which is continued alcohol abuse and liver damage and proved fatal. I talked about outpatient treatment with him. Patient understands basically what alcoholics anonymous is but has never really been engaged or gone to outpatient treatment.  Patient is an appropriate candidate for medication to help control alcohol abuse. I recommend starting naltrexone at a lower dose. Side effects discussed. There is some risk given his liver damage but there is some real benefit to be possibly gained in helping him stay sober. Patient needs to be referred to outpatient substance abuse treatment. I'll make sure that someone has come by and given him information. We will follow as needed.

## 2015-02-03 NOTE — Progress Notes (Signed)
[facility] [place] [facility]                                                                                                                                                                                            Patient Demographics   Derrick Blanchard, is a 34 y.o. male, DOB - 1981/07/08, UJW:[id number]  Admit date - 01/30/2015   Admitting Physician Crissie Figures, MD  Outpatient Primary MD for the patient is No primary care provider on file.  LOS - 4  Chief Complaint  Patient presents with  . Abdominal Pain  . Jaundice      Seen today,hypotensive.EGD  Is done today am.HIDA scan not done due to high bilirubin.  Review of Systems:   CONSTITUTIONAL: No documented fever. No fatigue, weakness. No weight gain, no weight loss.  EYES: No blurry or double vision.  ENT: No tinnitus. No postnasal drip. No redness of the oropharynx.  RESPIRATORY: No cough, no wheeze, no hemoptysis. No dyspnea.  CARDIOVASCULAR: No chest pain. No orthopnea. No palpitations. No syncope.  GASTROINTESTINAL: Positive nausea, no vomiting or diarrhea. Right abdominal pain. Dark color stools. Positive Jaundice GENITOURINARY: No dysuria or hematuria.  ENDOCRINE: No polyuria or nocturia. No heat or cold intolerance.  HEMATOLOGY: No anemia. No bruising. No bleeding.  INTEGUMENTARY: No rashes. No lesions. Jaundice  MUSCULOSKELETAL: No arthritis. No swelling. No gout.  NEUROLOGIC: No numbness, tingling, or ataxia. No seizure-type activity.  PSYCHIATRIC: No anxiety. No insomnia. No ADD.    Vitals:   Filed Vitals:   02/03/15 0950 02/03/15 1000 02/03/15 1010 02/03/15 1156  BP: 86/54  Pulse: 81 87 85 81  Temp:    99.1 F (37.3 C)  TempSrc:    Oral  Resp: 27 31 33 22  Height:      Weight:      SpO2: 97% 95% 94% 96%    Wt Readings from Last 3 Encounters:  02/02/15 101.197 kg (223 lb 1.6 oz)     Intake/Output Summary (Last 24 hours) at 02/03/15  1555 Last data filed at 02/03/15 0930  Gross per 24 hour  Intake    150 ml  Output      0 ml  Net    150 ml    Physical Exam:   GENERAL: deeply jaundiced. HEAD, EYES, EARS, NOSE AND THROAT: Atraumatic, normocephalic. Extraocular muscles are intact. Pupils equal and reactive to light. Sclerae anicteric. No conjunctival injection. No oro-pharyngeal erythema.  NECK: Supple. There is no jugular venous distention. No bruits, no lymphadenopathy, no thyromegaly.  HEART: Regular rate and rhythm, tachycardic. No murmurs,  no rubs, no clicks.  LUNGS: Clear to auscultation bilaterally. No rales or rhonchi. No wheezes.  ABDOMEN: Distended positive bowel sounds 4 , no guarding or rebound   EXTREMITIES: No evidence of any cyanosis, clubbing, or peripheral edema.  +2 pedal and radial pulses bilaterally.  NEUROLOGIC: The patient is alert, awake, and oriented x3 with no focal motor or sensory deficits appreciated bilaterally.  SKINHe is jaundiced   Psych: Not anxious, depressed LN: No inguinal LN enlargement    Antibiotics   Anti-infectives    None      Medications   Scheduled Meds: . Chlorhexidine Gluconate Cloth  6 each Topical Q0600  . LORazepam  0-4 mg Oral Q12H  . mupirocin ointment  1 application Nasal BID  . pantoprazole  40 mg Oral Q1200  . prednisoLONE  40 mg Oral Daily  . thiamine  100 mg Intravenous Daily  . thiamine  100 mg Oral Daily   Continuous Infusions:   PRN Meds:.LORazepam, morphine injection, ondansetron **OR** ondansetron (ZOFRAN) IV, oxyCODONE   Data Review:   Micro Results Recent Results (from the past 240 hour(s))  MRSA PCR Screening     Status: Abnormal   Collection Time: 01/31/15  9:45 AM  Result Value Ref Range Status   MRSA by PCR POSITIVE (A) NEGATIVE Final    Comment:        The GeneXpert MRSA Assay (FDA approved for NASAL specimens only), is one component of a comprehensive MRSA colonization surveillance program. It is not intended to diagnose  MRSA infection nor to guide or monitor treatment for MRSA infections. CRITICAL RESULT CALLED TO, READ BACK BY AND VERIFIED WITH: ERIN MAYNOR ON 01/31/15 AT 1137 BY QSD     Radiology Reports Ct Abdomen Pelvis W Contrast  01/30/2015   CLINICAL DATA:  Status post fall in bathtub, with persistent left flank pain and bruising. Pain radiates to the left lower quadrant and back. Recent jaundice and inability to urinate. Current history of cirrhosis. Initial encounter.  EXAM: CT ABDOMEN AND PELVIS WITH CONTRAST  TECHNIQUE: Multidetector CT imaging of the abdomen and pelvis was performed using the standard protocol following bolus administration of intravenous contrast.  CONTRAST:  OMNIPAQUE IOHEXOL 350 MG/ML SOLN  COMPARISON:  Right upper quadrant ultrasound performed 02/06/2013, and CT of the abdomen and pelvis from 02/10/2013  FINDINGS: The visualized lung bases are clear.  Extensive nodular heterogeneity is noted throughout the liver. This may reflect a combination of regenerative nodules and necrosis, though underlying malignancy cannot be excluded. The spleen is enlarged, measuring 17.2 cm in length. There is marked prominence of a recanalized umbilical vein, and collateral vessels along the anterior abdominal wall. Gastric, splenic and esophageal varices are seen.  The gallbladder is grossly unremarkable in appearance. Trace ascites is noted surrounding the liver and spleen. The pancreas and adrenal glands are grossly unremarkable.  The kidneys are unremarkable in appearance. There is no evidence of hydronephrosis. No renal or ureteral stones are seen. Mild nonspecific fluid is noted tracking along Gerota's fascia bilaterally.  The small bowel is unremarkable in appearance. The stomach is within normal limits. No acute vascular abnormalities are seen.  Mild soft tissue injury is noted at the left posterolateral flank, with minimal foci of hemorrhage.  The appendix is normal in caliber, without evidence  of appendicitis.  There is mild diffuse wall thickening along much of the colon, with mild surrounding soft tissue inflammation. This may reflect an acute infectious or inflammatory process, or could be seen  with hypoproteinemia. Would correlate for associated symptoms.  The bladder is mildly distended and grossly unremarkable. The prostate remains normal in size. Trace free fluid is noted within the pelvis. No inguinal lymphadenopathy is seen.  No acute osseous abnormalities are identified. 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  IMPRESSION: 1. Mild soft tissue injury noted at the left posterolateral flank, with minimal foci of soft tissue hemorrhage. 2. No additional evidence for traumatic injury to the abdomen or pelvis. 3. Extensive nodular heterogeneity throughout the liver. This may reflect a combination of regenerative nodules and necrosis in association with the patient's worsening cirrhosis, though underlying malignancy cannot be excluded. Would correlate with LFTs and consider dynamic liver protocol MRI or CT for further evaluation, as deemed clinically appropriate. 4. Mild diffuse wall thickening along much of the colon, with mild surrounding soft tissue inflammation. This could reflect an acute infectious or inflammatory process, or could be seen with hypoproteinemia. Would correlate for associated symptoms. 5. Splenomegaly noted. Marked prominence of a recanalized umbilical vein, and gastric, splenic and esophageal varices noted. 6. Trace ascites noted within the abdomen and pelvis. 7. Chronic bilateral pars defects at L5, without evidence of anterolisthesis.   Electronically Signed   By: Roanna Raider M.D.   On: 01/30/2015 20:54   Dg Chest Portable 1 View  01/30/2015   CLINICAL DATA:  Fall in bathtub yesterday  EXAM: PORTABLE CHEST - 1 VIEW  COMPARISON:  None.  FINDINGS: The heart size and mediastinal contours are within normal limits. Both lungs are  clear. The visualized skeletal structures are unremarkable.  IMPRESSION: No active disease.   Electronically Signed   By: Alcide Clever M.D.   On: 01/30/2015 19:19   US Abdomen Limited Ruq  01/31/2015   CLINICAL DATA:  Alcoholic hepatitis  EXAM: US ABDOMEN LIMITED - RIGHT UPPER QUADRANT  COMPARISON:  CT 01/30/2015  FINDINGS: Gallbladder:  Dependent sludge or debris within the gallbladder, with trace pericholecystic fluid. Wall thickness 2 mm. No sonographic Murphy sign.  Common bile duct:  Diameter: 4 mm  Liver:  Hepatic increased echogenicity likely indicates underlying steatosis without focal abnormality or intrahepatic ductal dilatation.  Small ascites noted adjacent to the liver.  IMPRESSION: Dependent gallbladder sludge or debris with a small amount of pericholecystic fluid which could indicate acute cholecystitis although tracking of pericolonic ascites could appear similar.  Hepatic increased echogenicity likely indicates underlying steatosis without focal abnormality or intrahepatic ductal dilatation.   Electronically Signed   By: Christiana Pellant M.D.   On: 01/31/2015 09:34     CBC  Recent Labs Lab 01/30/15 1823  01/31/15 1437 01/31/15 2034 02/01/15 0934 02/02/15 0356 02/03/15 0614  WBC 12.1*  < > 9.7 8.7 8.0 9.2 10.1  HGB 10.9*  < > 10.0* 9.6* 9.8* 9.2* 9.7*  HCT 30.2*  < > 27.5* 27.0* 27.2* 25.8* 27.0*  PLT 61*  < > 54* 61* 56* 69* 88*  MCV 100.5*  < > 101.1* 102.1* 102.9* 103.3* 105.0*  MCH 36.1*  < > 36.7* 36.4* 37.1* 37.0* 37.6*  MCHC 36.0  < > 36.3* 35.7 36.0 35.8 35.8  RDW 19.0*  < > 20.4* 20.4* 21.1* 21.3* 22.3*  LYMPHSABS 0.6*  --   --   --   --   --   --   MONOABS 1.2*  --   --   --   --   --   --   EOSABS 0.1  --   --   --   --   --   --  BASOSABS 0.0  --   --   --   --   --   --   < > = values in this interval not displayed.  Chemistries   Recent Labs Lab 01/30/15 1823 01/31/15 0338 02/01/15 0417 02/02/15 0356 02/03/15 0614  NA 120* 124* 130* 132* 131*  K  3.2* 3.0* 3.2* 3.5 3.2*  CL 84* 88* 94* 97* 96*  CO2 24 24 27 27 27   GLUCOSE 106* 101* 133* 98 93  BUN 5* 6 8 8 11   CREATININE SEE COMMENTS SEE COMMENTS SEE COMMENTS SEE COMMENTS <0.30*  CALCIUM 7.1* 7.2* 7.4* 7.5* 7.3*  AST 378* 328* 249* 169* 140*  ALT 75* 74* 63 53 57  ALKPHOS 184* 159* 143* 135* 123  BILITOT 38.9* 40.2* 40.8* 38.8* 39.7*   ------------------------------------------------------------------------------------------------------------------ CrCl cannot be calculated (Patient has no serum creatinine result on file.). ------------------------------------------------------------------------------------------------------------------ No results for input(s): HGBA1C in the last 72 hours. ------------------------------------------------------------------------------------------------------------------ No results for input(s): CHOL, HDL, LDLCALC, TRIG, CHOLHDL, LDLDIRECT in the last 72 hours. ------------------------------------------------------------------------------------------------------------------ No results for input(s): TSH, T4TOTAL, T3FREE, THYROIDAB in the last 72 hours.  Invalid input(s): FREET3 ------------------------------------------------------------------------------------------------------------------ No results for input(s): VITAMINB12, FOLATE, FERRITIN, TIBC, IRON, RETICCTPCT in the last 72 hours.  Coagulation profile  Recent Labs Lab 01/30/15 1823 01/31/15 0338 02/01/15 0934  INR 1.66 1.61 1.80    No results for input(s): DDIMER in the last 72 hours.  Cardiac Enzymes  Recent Labs Lab 01/30/15 1823  TROPONINI <0.03   ------------------------------------------------------------------------------------------------------------------ Invalid input(s): POCBNP    Assessment & Plan   Principal Problem:  1. Alcoholic hepatitis;  continue prednisolone 40 mg daily, appreciate GI input, hepatitis panel pending ,s/p EGD not compete due to large  amount of food in stomach,needs rot EGD as out  Pt,continue clear liquids 2.  Alcohol use: CIWA protocol 3. Melena : On Protonix which will continue to monitor, hemoglobin stable  4. 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 Hyponatremia Due to liver cirrhosis, currently stable 6. large hematoma on his trunk. Monitor his hemoglobin 7. Anemia Likely anemia of chronic disease due to his liver disease as well as some blood loss 8. Right upper quadrant abdominal pain, with ultrasound of the abdomen suggestive of possible gallbladder and inflammation however this is likely due to free fluid from his liver disease       Code Status Orders        Start     Ordered   01/31/15 0200  Full code   Continuous     01/31/15 0159        DVT Prophylaxis  SCDs   Lab Results  Component Value Date   PLT 88* 02/03/2015     Time Spent in minutes   35 minutes    Yenifer Saccente M.D on 02/03/2015 at 3:55 PM  Between 7am to 6pm - Pager - [phone]  After 6pm go to [email or web address] - password [email or web address] [facility]  [facility]   Office  [phone]

## 2015-02-04 LAB — COMPREHENSIVE METABOLIC PANEL
ALBUMIN: 2.4 g/dL — AB (ref 3.5–5.0)
ALT: 52 U/L (ref 17–63)
AST: 126 U/L — ABNORMAL HIGH (ref 15–41)
Alkaline Phosphatase: 123 U/L (ref 38–126)
Anion gap: 8 (ref 5–15)
BUN: 15 mg/dL (ref 6–20)
CALCIUM: 7.3 mg/dL — AB (ref 8.9–10.3)
CO2: 27 mmol/L (ref 22–32)
Chloride: 97 mmol/L — ABNORMAL LOW (ref 101–111)
Glucose, Bld: 115 mg/dL — ABNORMAL HIGH (ref 65–99)
Potassium: 3.4 mmol/L — ABNORMAL LOW (ref 3.5–5.1)
Sodium: 132 mmol/L — ABNORMAL LOW (ref 135–145)
Total Bilirubin: 43.6 mg/dL (ref 0.3–1.2)
Total Protein: 6.6 g/dL (ref 6.5–8.1)

## 2015-02-04 LAB — CBC
HEMATOCRIT: 29.1 % — AB (ref 40.0–52.0)
Hemoglobin: 10.1 g/dL — ABNORMAL LOW (ref 13.0–18.0)
MCH: 37.1 pg — ABNORMAL HIGH (ref 26.0–34.0)
MCHC: 34.8 g/dL (ref 32.0–36.0)
MCV: 106.6 fL — ABNORMAL HIGH (ref 80.0–100.0)
Platelets: 116 10*3/uL — ABNORMAL LOW (ref 150–440)
RBC: 2.73 MIL/uL — ABNORMAL LOW (ref 4.40–5.90)
RDW: 23 % — ABNORMAL HIGH (ref 11.5–14.5)
WBC: 12.5 10*3/uL — ABNORMAL HIGH (ref 3.8–10.6)

## 2015-02-04 MED ORDER — LACTULOSE 10 GM/15ML PO SOLN
20.0000 g | Freq: Every day | ORAL | Status: DC
  Administered 2015-02-05 – 2015-02-07 (×3): 20 g via ORAL
  Filled 2015-02-04 (×3): qty 30

## 2015-02-04 NOTE — Progress Notes (Signed)
Call back received from Dr.konidena, Informed of patient's critical bilirubin-43.6 and informed of bloody stool.  Informed to call Dr.Rein regarding bloody stool, no other orders given.

## 2015-02-04 NOTE — Progress Notes (Signed)
[facility] [place] [facility]                                                                                                                                                                                            Patient Demographics   Derrick Blanchard, is a 34 y.o. male, DOB - [birth year], UEA:[id number]  Admit date - 01/30/2015   Admitting Physician Crissie FiguresEdavally N Reddy, MD  Outpatient Primary MD for the patient is No primary care provider on file.  LOS - 5  Chief Complaint  Patient presents with  . Abdominal Pain  . Jaundice     Having breakfast now.no nausea.still has left side abodminal pain where he has bruise /echymosis  Due to fall,no hallucinations or tremors.  Review of Systems:   CONSTITUTIONAL: No documented fever. No fatigue, weakness. No weight gain, no weight loss.  EYES: No blurry or double vision.  ENT: No tinnitus. No postnasal drip. No redness of the oropharynx.  RESPIRATORY: No cough, no wheeze, no hemoptysis. No dyspnea.  CARDIOVASCULAR: No chest pain. No orthopnea. No palpitations. No syncope.  GASTROINTESTINAL: Positive nausea, no vomiting or diarrhea. Right abdominal pain. Dark color stools. Positive Jaundice GENITOURINARY: No dysuria or hematuria.  ENDOCRINE: No polyuria or nocturia. No heat or cold intolerance.  HEMATOLOGY: No anemia. No bruising. No bleeding.  INTEGUMENTARY: No rashes. No lesions. Jaundice  MUSCULOSKELETAL: No arthritis. No swelling. No gout.  NEUROLOGIC: No numbness, tingling, or ataxia. No seizure-type activity.  PSYCHIATRIC: No anxiety. No insomnia. No ADD.    Vitals:   Filed Vitals:   02/03/15 2037 02/04/15 0352 02/04/15 0600 02/04/15 0831  BP: 99/61 96/56  105/75  Pulse: 76 82  83  Temp: 98.2 F (36.8 C) 98 F (36.7 C)  98.2 F (36.8 C)  TempSrc: Oral Oral  Oral  Resp: 18 18  18   Height:      Weight:   105.144 kg (231 lb 12.8 oz)   SpO2: 99% 99%  98%    Wt Readings from Last 3  Encounters:  02/04/15 105.144 kg (231 lb 12.8 oz)     Intake/Output Summary (Last 24 hours) at 02/04/15 0842 Last data filed at 02/03/15 0930  Gross per 24 hour  Intake    150 ml  Output      0 ml  Net    150 ml    Physical Exam:   GENERAL: deeply jaundiced. HEAD, EYES, EARS, NOSE AND THROAT: Atraumatic, normocephalic. Extraocular muscles are intact. Pupils equal and reactive to light. Sclerae anicteric. No conjunctival injection. No oro-pharyngeal erythema.  NECK: Supple. There is no jugular  venous distention. No bruits, no lymphadenopathy, no thyromegaly.  HEART: Regular rate and rhythm, tachycardic. No murmurs, no rubs, no clicks.  LUNGS: Clear to auscultation bilaterally. No rales or rhonchi. No wheezes.  ABDOMEN: Distended positive bowel sounds 4 , no guarding or rebound ,area of echymosis in left groin,color is fading  EXTREMITIES: No evidence of any cyanosis, clubbing, or peripheral edema.  +2 pedal and radial pulses bilaterally.  NEUROLOGIC: The patient is alert, awake, and oriented x3 with no focal motor or sensory deficits appreciated bilaterally.  SKINHe is jaundiced   Psych: Not anxious, depressed LN: No inguinal LN enlargement    Antibiotics   Anti-infectives    None      Medications   Scheduled Meds: . Chlorhexidine Gluconate Cloth  6 each Topical Q0600  . LORazepam  0-4 mg Oral Q12H  . mupirocin ointment  1 application Nasal BID  . pantoprazole  40 mg Oral Q1200  . prednisoLONE  40 mg Oral Daily  . thiamine  100 mg Intravenous Daily  . thiamine  100 mg Oral Daily   Continuous Infusions:   PRN Meds:.LORazepam, morphine injection, ondansetron **OR** ondansetron (ZOFRAN) IV, oxyCODONE   Data Review:   Micro Results Recent Results (from the past 240 hour(s))  MRSA PCR Screening     Status: Abnormal   Collection Time: 01/31/15  9:45 AM  Result Value Ref Range Status   MRSA by PCR POSITIVE (A) NEGATIVE Final    Comment:        The GeneXpert MRSA  Assay (FDA approved for NASAL specimens only), is one component of a comprehensive MRSA colonization surveillance program. It is not intended to diagnose MRSA infection nor to guide or monitor treatment for MRSA infections. CRITICAL RESULT CALLED TO, READ BACK BY AND VERIFIED WITH: ERIN MAYNOR ON 01/31/15 AT 1137 BY QSD     Radiology Reports Ct Abdomen Pelvis W Contrast  01/30/2015   CLINICAL DATA:  Status post fall in bathtub, with persistent left flank pain and bruising. Pain radiates to the left lower quadrant and back. Recent jaundice and inability to urinate. Current history of cirrhosis. Initial encounter.  EXAM: CT ABDOMEN AND PELVIS WITH CONTRAST  TECHNIQUE: Multidetector CT imaging of the abdomen and pelvis was performed using the standard protocol following bolus administration of intravenous contrast.  CONTRAST:  OMNIPAQUE IOHEXOL 350 MG/ML SOLN  COMPARISON:  Right upper quadrant ultrasound performed 02/06/2013, and CT of the abdomen and pelvis from 02/10/2013  FINDINGS: The visualized lung bases are clear.  Extensive nodular heterogeneity is noted throughout the liver. This may reflect a combination of regenerative nodules and necrosis, though underlying malignancy cannot be excluded. The spleen is enlarged, measuring 17.2 cm in length. There is marked prominence of a recanalized umbilical vein, and collateral vessels along the anterior abdominal wall. Gastric, splenic and esophageal varices are seen.  The gallbladder is grossly unremarkable in appearance. Trace ascites is noted surrounding the liver and spleen. The pancreas and adrenal glands are grossly unremarkable.  The kidneys are unremarkable in appearance. There is no evidence of hydronephrosis. No renal or ureteral stones are seen. Mild nonspecific fluid is noted tracking along Gerota's fascia bilaterally.  The small bowel is unremarkable in appearance. The stomach is within normal limits. No acute vascular abnormalities are  seen.  Mild soft tissue injury is noted at the left posterolateral flank, with minimal foci of hemorrhage.  The appendix is normal in caliber, without evidence of appendicitis.  There is mild diffuse wall thickening  along much of the colon, with mild surrounding soft tissue inflammation. This may reflect an acute infectious or inflammatory process, or could be seen with hypoproteinemia. Would correlate for associated symptoms.  The bladder is mildly distended and grossly unremarkable. The prostate remains normal in size. Trace free fluid is noted within the pelvis. No inguinal lymphadenopathy is seen.  No acute osseous abnormalities are identified. 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  IMPRESSION: 1. Mild soft tissue injury noted at the left posterolateral flank, with minimal foci of soft tissue hemorrhage. 2. No additional evidence for traumatic injury to the abdomen or pelvis. 3. Extensive nodular heterogeneity throughout the liver. This may reflect a combination of regenerative nodules and necrosis in association with the patient's worsening cirrhosis, though underlying malignancy cannot be excluded. Would correlate with LFTs and consider dynamic liver protocol MRI or CT for further evaluation, as deemed clinically appropriate. 4. Mild diffuse wall thickening along much of the colon, with mild surrounding soft tissue inflammation. This could reflect an acute infectious or inflammatory process, or could be seen with hypoproteinemia. Would correlate for associated symptoms. 5. Splenomegaly noted. Marked prominence of a recanalized umbilical vein, and gastric, splenic and esophageal varices noted. 6. Trace ascites noted within the abdomen and pelvis. 7. Chronic bilateral pars defects at L5, without evidence of anterolisthesis.   Electronically Signed   By: Roanna Raider M.D.   On: 01/30/2015 20:54   Dg Chest Portable 1 View  01/30/2015   CLINICAL DATA:  Fall in  bathtub yesterday  EXAM: PORTABLE CHEST - 1 VIEW  COMPARISON:  None.  FINDINGS: The heart size and mediastinal contours are within normal limits. Both lungs are clear. The visualized skeletal structures are unremarkable.  IMPRESSION: No active disease.   Electronically Signed   By: Alcide Clever M.D.   On: 01/30/2015 19:19   US Abdomen Limited Ruq  01/31/2015   CLINICAL DATA:  Alcoholic hepatitis  EXAM: US ABDOMEN LIMITED - RIGHT UPPER QUADRANT  COMPARISON:  CT 01/30/2015  FINDINGS: Gallbladder:  Dependent sludge or debris within the gallbladder, with trace pericholecystic fluid. Wall thickness 2 mm. No sonographic Murphy sign.  Common bile duct:  Diameter: 4 mm  Liver:  Hepatic increased echogenicity likely indicates underlying steatosis without focal abnormality or intrahepatic ductal dilatation.  Small ascites noted adjacent to the liver.  IMPRESSION: Dependent gallbladder sludge or debris with a small amount of pericholecystic fluid which could indicate acute cholecystitis although tracking of pericolonic ascites could appear similar.  Hepatic increased echogenicity likely indicates underlying steatosis without focal abnormality or intrahepatic ductal dilatation.   Electronically Signed   By: Christiana Pellant M.D.   On: 01/31/2015 09:34     CBC  Recent Labs Lab 01/30/15 1823  01/31/15 1437 01/31/15 2034 02/01/15 0934 02/02/15 0356 02/03/15 0614  WBC 12.1*  < > 9.7 8.7 8.0 9.2 10.1  HGB 10.9*  < > 10.0* 9.6* 9.8* 9.2* 9.7*  HCT 30.2*  < > 27.5* 27.0* 27.2* 25.8* 27.0*  PLT 61*  < > 54* 61* 56* 69* 88*  MCV 100.5*  < > 101.1* 102.1* 102.9* 103.3* 105.0*  MCH 36.1*  < > 36.7* 36.4* 37.1* 37.0* 37.6*  MCHC 36.0  < > 36.3* 35.7 36.0 35.8 35.8  RDW 19.0*  < > 20.4* 20.4* 21.1* 21.3* 22.3*  LYMPHSABS 0.6*  --   --   --   --   --   --   MONOABS 1.2*  --   --   --   --   --   --  EOSABS 0.1  --   --   --   --   --   --   BASOSABS 0.0  --   --   --   --   --   --   < > = values in this interval  not displayed.  Chemistries   Recent Labs Lab 01/30/15 1823 01/31/15 0338 02/01/15 0417 02/02/15 0356 02/03/15 0614  NA 120* 124* 130* 132* 131*  K 3.2* 3.0* 3.2* 3.5 3.2*  CL 84* 88* 94* 97* 96*  CO2 24 24 27 27 27   GLUCOSE 106* 101* 133* 98 93  BUN 5* 6 8 8 11   CREATININE SEE COMMENTS SEE COMMENTS SEE COMMENTS SEE COMMENTS <0.30*  CALCIUM 7.1* 7.2* 7.4* 7.5* 7.3*  AST 378* 328* 249* 169* 140*  ALT 75* 74* 63 53 57  ALKPHOS 184* 159* 143* 135* 123  BILITOT 38.9* 40.2* 40.8* 38.8* 39.7*   ------------------------------------------------------------------------------------------------------------------ CrCl cannot be calculated (Patient has no serum creatinine result on file.). ------------------------------------------------------------------------------------------------------------------ No results for input(s): HGBA1C in the last 72 hours. ------------------------------------------------------------------------------------------------------------------ No results for input(s): CHOL, HDL, LDLCALC, TRIG, CHOLHDL, LDLDIRECT in the last 72 hours. ------------------------------------------------------------------------------------------------------------------ No results for input(s): TSH, T4TOTAL, T3FREE, THYROIDAB in the last 72 hours.  Invalid input(s): FREET3 ------------------------------------------------------------------------------------------------------------------ No results for input(s): VITAMINB12, FOLATE, FERRITIN, TIBC, IRON, RETICCTPCT in the last 72 hours.  Coagulation profile  Recent Labs Lab 01/30/15 1823 01/31/15 0338 02/01/15 0934  INR 1.66 1.61 1.80    No results for input(s): DDIMER in the last 72 hours.  Cardiac Enzymes  Recent Labs Lab 01/30/15 1823  TROPONINI <0.03   ------------------------------------------------------------------------------------------------------------------ Invalid input(s): POCBNP    Assessment & Plan    Principal Problem:  1. Alcoholic hepatitis;  continue prednisolone 40 mg daily, appreciate GI input, hepatitis panel pending ,s/p EGD not compete due to large amount of food in stomach,needs rot EGD as out  Pt,continue  diey 2.  Alcohol use: CIWA protocol,seen by psych, 3. Melena : On Protonix which will continue to monitor, hemoglobin stable ,still has some blood in stable,but hb  Is stable 4. 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 Hyponatremia Due to liver cirrhosis, currently stable 6. large hematoma on his trunk. Monitor his hemoglobin ,resolving slowly 7. Anemia Likely anemia of chronic disease due to his liver disease as well as some blood loss 8. Right upper quadrant abdominal pain, with ultrasound of the abdomen suggestive of possible gallbladder and inflammation however this is likely due to free fluid from his liver disease      Code Status Orders        Start     Ordered   01/31/15 0200  Full code   Continuous     01/31/15 0159        DVT Prophylaxis  SCDs   Lab Results  Component Value Date   PLT 88* 02/03/2015     Time Spent in minutes   35 minutes    Lowell Mcgurk M.D on 02/04/2015 at 8:42 AM  Between 7am to 6pm - Pager - [phone]  After 6pm go to [email or web address] - password [email or web address] [facility]  [facility]   Office  [phone]

## 2015-02-04 NOTE — Progress Notes (Signed)
Pt. Alert and oriented. Blood pressure remains low but pt. Asymptomatic. Pain controlled with meds per MAR. Anxiety controlled with PO meds. Pt. Scoring low on CIWA scale. No coverage needed. Up ambulating to bathroom with stand-by assist. Pts. Had one stool with little blood in it this shift. Resting quietly at this time.

## 2015-02-04 NOTE — Progress Notes (Signed)
Paged Dr.Rein at [phone], call back received, informed of pt's bloody stool this am x 1 ( moderate blood in stool), orders received to order hgb  for this am and change diet to clear liquid.

## 2015-02-04 NOTE — Progress Notes (Signed)
Call receive from lab tech -Oneida AlarQuanisha with a critical lab: Bilirubin 43.6 Paged Dr.Konidena, waiting for callback.

## 2015-02-04 NOTE — Progress Notes (Signed)
GI Inpatient Follow-up Note  Patient Identification: Derrick Blanchard is a 34 y.o. male with ETOH hepatitis, rectal bleeding.   Subjective:  Bloody stool this am. No bleeding since.  + abd pain.  No n/v, no f/c.   EGD yesterday: stomach full of food.  Hgb stable.   Scheduled Inpatient Medications:  . Chlorhexidine Gluconate Cloth  6 each Topical Q0600  . LORazepam  0-4 mg Oral Q12H  . mupirocin ointment  1 application Nasal BID  . pantoprazole  40 mg Oral Q1200  . prednisoLONE  40 mg Oral Daily  . thiamine  100 mg Intravenous Daily  . thiamine  100 mg Oral Daily    Continuous Inpatient Infusions:     PRN Inpatient Medications:  LORazepam, morphine injection, ondansetron **OR** ondansetron (ZOFRAN) IV, oxyCODONE      Physical Examination: BP 119/69 mmHg  Pulse 89  Temp(Src) 98.5 F (36.9 C) (Oral)  Resp 18  Ht 5\' 8"  (1.727 m)  Wt 231 lb 12.8 oz (105.144 kg)  BMI 35.25 kg/m2  SpO2 98% Gen: NAD, alert and oriented x 4,  icteric Neck: supple, no JVD or thyromegaly Chest: CTA bilaterally, no wheezes, crackles, or other adventitious sounds CV: RRR, no m/g/c/r Abd: somewhat distended, +BS in all four quadrants; no HSM, guarding, ridigity, or rebound tenderness Ext: no edema, well perfused with 2+ pulses, Skin: no rash or lesions noted Lymph: no LAD  Data: Lab Results  Component Value Date   WBC 12.5* 02/04/2015   HGB 10.1* 02/04/2015   HCT 29.1* 02/04/2015   MCV 106.6* 02/04/2015   PLT 116* 02/04/2015    Recent Labs Lab 02/02/15 0356 02/03/15 0614 02/04/15 0600  HGB 9.2* 9.7* 10.1*   Lab Results  Component Value Date   NA 132* 02/04/2015   K 3.4* 02/04/2015   CL 97* 02/04/2015   CO2 27 02/04/2015   BUN 15 02/04/2015   CREATININE ICTERUS AT THIS LEVEL MAY AFFECT RESULT 02/04/2015   Lab Results  Component Value Date   ALT 52 02/04/2015   AST 126* 02/04/2015   ALKPHOS 123 02/04/2015   BILITOT 43.6* 02/04/2015    Recent Labs Lab 01/30/15 1823   02/01/15 0934  APTT 48*  --   --   INR 1.66  < > 1.80  < > = values in this interval not displayed.   Assessment/Plan: Derrick Blanchard is a 34 y.o. male with severe ETOH hepatitis on prednisolone.  Also with rectal bleeding this am.  No change whatsoever in Hgb.    Recommendations: - EGD and colon Friday if further bleeding or drop in Hgb, cont clear liquid diet for now - bleeding scan for further rectal bleeding.  - cont prednisolon 40 mg daily for ETOH hep with high discriminant function - restart lactulose at 30 ml daily - complete ETOH cessation.   Please call with questions or concerns.  Ayeden Gladman, Addison NaegeliMATTHEW GORDON, MD

## 2015-02-04 NOTE — Progress Notes (Signed)
Patient getting up unassisted to bathroom, turning the bed alarm off self. Explained to call for assistant, possible risk for fall. Patient verbalized understanding. Will con't to monitor the patient.

## 2015-02-04 NOTE — Consult Note (Signed)
  Psychiatry: Follow-up for patient with alcohol dependence and cirrhosis. Patient says he is feeling a little bit better today. Still feels a little jittery and anxious but has not had any hallucinations not had any sign of delirium. Her Lind GuestGraff he is alert and oriented. Eye contact good. Wide-awake. Psychomotor activity normal. Speech normal rate tone and volume. Affect slightly constricted. Denies suicidal or homicidal ideation no sign of psychosis. Short and long-term memory grossly intact.  Patient detoxing without incident. No sign of delirium. I will continue to follow. Patient can be started on naltrexone because of his continued need for narcotic pain medicine. Continue to emphasize the need for outpatient substance abuse treatment.

## 2015-02-05 ENCOUNTER — Encounter: Payer: Self-pay | Admitting: Gastroenterology

## 2015-02-05 LAB — COMPREHENSIVE METABOLIC PANEL
ALBUMIN: 2.4 g/dL — AB (ref 3.5–5.0)
ALT: 56 U/L (ref 17–63)
ANION GAP: 8 (ref 5–15)
AST: 111 U/L — AB (ref 15–41)
Alkaline Phosphatase: 126 U/L (ref 38–126)
BUN: 14 mg/dL (ref 6–20)
CALCIUM: 7.2 mg/dL — AB (ref 8.9–10.3)
CO2: 28 mmol/L (ref 22–32)
Chloride: 97 mmol/L — ABNORMAL LOW (ref 101–111)
Creatinine, Ser: UNDETERMINED mg/dL (ref 0.61–1.24)
GLUCOSE: 99 mg/dL (ref 65–99)
Potassium: 3 mmol/L — ABNORMAL LOW (ref 3.5–5.1)
Sodium: 133 mmol/L — ABNORMAL LOW (ref 135–145)
Total Bilirubin: 42.1 mg/dL (ref 0.3–1.2)
Total Protein: 6.6 g/dL (ref 6.5–8.1)

## 2015-02-05 LAB — CBC
HCT: 28.8 % — ABNORMAL LOW (ref 40.0–52.0)
Hemoglobin: 10 g/dL — ABNORMAL LOW (ref 13.0–18.0)
MCH: 36.7 pg — AB (ref 26.0–34.0)
MCHC: 34.6 g/dL (ref 32.0–36.0)
MCV: 106 fL — AB (ref 80.0–100.0)
PLATELETS: 106 10*3/uL — AB (ref 150–440)
RBC: 2.71 MIL/uL — ABNORMAL LOW (ref 4.40–5.90)
RDW: 22.6 % — AB (ref 11.5–14.5)
WBC: 11.8 10*3/uL — AB (ref 3.8–10.6)

## 2015-02-05 LAB — PROTIME-INR
INR: 1.72
Prothrombin Time: 20.3 seconds — ABNORMAL HIGH (ref 11.4–15.0)

## 2015-02-05 MED ORDER — POTASSIUM CHLORIDE CRYS ER 20 MEQ PO TBCR
40.0000 meq | EXTENDED_RELEASE_TABLET | Freq: Two times a day (BID) | ORAL | Status: AC
  Administered 2015-02-05 (×2): 40 meq via ORAL
  Filled 2015-02-05 (×2): qty 2

## 2015-02-05 NOTE — Progress Notes (Signed)
CRITICAL VALUE ALERT  Critical value received:  Biliruben 42.1  Date of notification:  02/05/2015  Time of notification:  6:24 AM   Critical value read back:Yes.    Nurse who received alert:  Victorino DikeJennifer, RN  MD notified (1st page):  Dr. Sheryle Hailiamond  Time of first page:  0622  MD notified (2nd page):  Time of second page:  Responding MD:  Dr. Sheryle Hailiamond  Time MD responded:  [phone]   No new orders per Dr. Sheryle Hailiamond

## 2015-02-05 NOTE — Progress Notes (Signed)
[facility] [place] [facility]                                                                                                                                                                                            Patient Demographics   Derrick Blanchard, is a 34 y.o. male, DOB - Oct 17, 1980, [id number]  Admit date - 01/30/2015   Admitting Physician Crissie FiguresEdavally N Reddy, MD  Outpatient Primary MD for the patient is No primary care provider on file.  LOS - 6  Chief Complaint  Patient presents with  . Abdominal Pain  . Jaundice      Renette ButtersSen today,says he is very hungry wants chicken noodle soup.on clears for EGd/colonoscopy tomorrow.no more rectal bleeding.  Review of Systems:   CONSTITUTIONAL: No documented fever. No fatigue, weakness. No weight gain, no weight loss.  EYES: No blurry or double vision.  ENT: No tinnitus. No postnasal drip. No redness of the oropharynx.  RESPIRATORY: No cough, no wheeze, no hemoptysis. No dyspnea.  CARDIOVASCULAR: No chest pain. No orthopnea. No palpitations. No syncope.  GASTROINTESTINAL: no  nausea, no vomiting or diarrhea. Right abdominal pain. Dark color stools. Positive Jaundice GENITOURINARY: No dysuria or hematuria.  ENDOCRINE: No polyuria or nocturia. No heat or cold intolerance.  HEMATOLOGY: No anemia. No bruising. No bleeding.  INTEGUMENTARY: No rashes. No lesions. Jaundice  MUSCULOSKELETAL: No arthritis. No swelling. No gout.  NEUROLOGIC: No numbness, tingling, or ataxia. No seizure-type activity.  PSYCHIATRIC: No anxiety. No insomnia. No ADD.    Vitals:   Filed Vitals:   02/04/15 2135 02/05/15 0519 02/05/15 0551 02/05/15 1028  BP: 119/69 110/70  122/78  Pulse: 89 85  86  Temp: 98.5 F (36.9 C) 98.4 F (36.9 C)    TempSrc: Oral Oral    Resp: 18 18    Height:      Weight:   105.507 kg (232 lb 9.6 oz)   SpO2: 98% 97%      Wt Readings from Last 3 Encounters:  02/05/15 105.507 kg (232 lb 9.6 oz)      Intake/Output Summary (Last 24 hours) at 02/05/15 1248 Last data filed at 02/05/15 1043  Gross per 24 hour  Intake    400 ml  Output   2150 ml  Net  -1750 ml    Physical Exam:   GENERAL: deeply jaundiced. HEAD, EYES, EARS, NOSE AND THROAT: Atraumatic, normocephalic. Extraocular muscles are intact. Pupils equal and reactive to light. Sclerae anicteric. No conjunctival injection. No oro-pharyngeal erythema.  NECK: Supple. There is no jugular venous distention. No bruits, no lymphadenopathy, no thyromegaly.  HEART:  Regular rate and rhythm, tachycardic. No murmurs, no rubs, no clicks.  LUNGS: Clear to auscultation bilaterally. No rales or rhonchi. No wheezes.  ABDOMEN: Distended positive bowel sounds 4 , no guarding or rebound hematoma on elft lower quadrant resolving,but tender to palpation.  EXTREMITIES: No evidence of any cyanosis, clubbing, or peripheral edema.  +2 pedal and radial pulses bilaterally.  NEUROLOGIC: The patient is alert, awake, and oriented x3 with no focal motor or sensory deficits appreciated bilaterally.  SKINHe is jaundiced   Psych: Not anxious, depressed LN: No inguinal LN enlargement    Antibiotics   Anti-infectives    None      Medications   Scheduled Meds: . Chlorhexidine Gluconate Cloth  6 each Topical Q0600  . lactulose  20 g Oral Daily  . LORazepam  0-4 mg Oral Q12H  . pantoprazole  40 mg Oral Q1200  . potassium chloride  40 mEq Oral BID  . prednisoLONE  40 mg Oral Daily  . thiamine  100 mg Intravenous Daily  . thiamine  100 mg Oral Daily   Continuous Infusions:   PRN Meds:.LORazepam, morphine injection, ondansetron **OR** ondansetron (ZOFRAN) IV, oxyCODONE   Data Review:   Micro Results Recent Results (from the past 240 hour(s))  MRSA PCR Screening     Status: Abnormal   Collection Time: 01/31/15  9:45 AM  Result Value Ref Range Status   MRSA by PCR POSITIVE (A) NEGATIVE Final    Comment:        The GeneXpert MRSA Assay  (FDA approved for NASAL specimens only), is one component of a comprehensive MRSA colonization surveillance program. It is not intended to diagnose MRSA infection nor to guide or monitor treatment for MRSA infections. CRITICAL RESULT CALLED TO, READ BACK BY AND VERIFIED WITH: ERIN MAYNOR ON 01/31/15 AT 1137 BY QSD     Radiology Reports Ct Abdomen Pelvis W Contrast  01/30/2015   CLINICAL DATA:  Status post fall in bathtub, with persistent left flank pain and bruising. Pain radiates to the left lower quadrant and back. Recent jaundice and inability to urinate. Current history of cirrhosis. Initial encounter.  EXAM: CT ABDOMEN AND PELVIS WITH CONTRAST  TECHNIQUE: Multidetector CT imaging of the abdomen and pelvis was performed using the standard protocol following bolus administration of intravenous contrast.  CONTRAST:  OMNIPAQUE IOHEXOL 350 MG/ML SOLN  COMPARISON:  Right upper quadrant ultrasound performed 02/06/2013, and CT of the abdomen and pelvis from 02/10/2013  FINDINGS: The visualized lung bases are clear.  Extensive nodular heterogeneity is noted throughout the liver. This may reflect a combination of regenerative nodules and necrosis, though underlying malignancy cannot be excluded. The spleen is enlarged, measuring 17.2 cm in length. There is marked prominence of a recanalized umbilical vein, and collateral vessels along the anterior abdominal wall. Gastric, splenic and esophageal varices are seen.  The gallbladder is grossly unremarkable in appearance. Trace ascites is noted surrounding the liver and spleen. The pancreas and adrenal glands are grossly unremarkable.  The kidneys are unremarkable in appearance. There is no evidence of hydronephrosis. No renal or ureteral stones are seen. Mild nonspecific fluid is noted tracking along Gerota's fascia bilaterally.  The small bowel is unremarkable in appearance. The stomach is within normal limits. No acute vascular abnormalities are seen.   Mild soft tissue injury is noted at the left posterolateral flank, with minimal foci of hemorrhage.  The appendix is normal in caliber, without evidence of appendicitis.  There is mild diffuse wall thickening along  much of the colon, with mild surrounding soft tissue inflammation. This may reflect an acute infectious or inflammatory process, or could be seen with hypoproteinemia. Would correlate for associated symptoms.  The bladder is mildly distended and grossly unremarkable. The prostate remains normal in size. Trace free fluid is noted within the pelvis. No inguinal lymphadenopathy is seen.  No acute osseous abnormalities are identified. 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  IMPRESSION: 1. Mild soft tissue injury noted at the left posterolateral flank, with minimal foci of soft tissue hemorrhage. 2. No additional evidence for traumatic injury to the abdomen or pelvis. 3. Extensive nodular heterogeneity throughout the liver. This may reflect a combination of regenerative nodules and necrosis in association with the patient's worsening cirrhosis, though underlying malignancy cannot be excluded. Would correlate with LFTs and consider dynamic liver protocol MRI or CT for further evaluation, as deemed clinically appropriate. 4. Mild diffuse wall thickening along much of the colon, with mild surrounding soft tissue inflammation. This could reflect an acute infectious or inflammatory process, or could be seen with hypoproteinemia. Would correlate for associated symptoms. 5. Splenomegaly noted. Marked prominence of a recanalized umbilical vein, and gastric, splenic and esophageal varices noted. 6. Trace ascites noted within the abdomen and pelvis. 7. Chronic bilateral pars defects at L5, without evidence of anterolisthesis.   Electronically Signed   By: Roanna Raider M.D.   On: 01/30/2015 20:54   Dg Chest Portable 1 View  01/30/2015   CLINICAL DATA:  Fall in bathtub  yesterday  EXAM: PORTABLE CHEST - 1 VIEW  COMPARISON:  None.  FINDINGS: The heart size and mediastinal contours are within normal limits. Both lungs are clear. The visualized skeletal structures are unremarkable.  IMPRESSION: No active disease.   Electronically Signed   By: Alcide Clever M.D.   On: 01/30/2015 19:19   US Abdomen Limited Ruq  01/31/2015   CLINICAL DATA:  Alcoholic hepatitis  EXAM: US ABDOMEN LIMITED - RIGHT UPPER QUADRANT  COMPARISON:  CT 01/30/2015  FINDINGS: Gallbladder:  Dependent sludge or debris within the gallbladder, with trace pericholecystic fluid. Wall thickness 2 mm. No sonographic Murphy sign.  Common bile duct:  Diameter: 4 mm  Liver:  Hepatic increased echogenicity likely indicates underlying steatosis without focal abnormality or intrahepatic ductal dilatation.  Small ascites noted adjacent to the liver.  IMPRESSION: Dependent gallbladder sludge or debris with a small amount of pericholecystic fluid which could indicate acute cholecystitis although tracking of pericolonic ascites could appear similar.  Hepatic increased echogenicity likely indicates underlying steatosis without focal abnormality or intrahepatic ductal dilatation.   Electronically Signed   By: Christiana Pellant M.D.   On: 01/31/2015 09:34     CBC  Recent Labs Lab 01/30/15 1823  02/01/15 0934 02/02/15 0356 02/03/15 0614 02/04/15 0600 02/05/15 0531  WBC 12.1*  < > 8.0 9.2 10.1 12.5* 11.8*  HGB 10.9*  < > 9.8* 9.2* 9.7* 10.1* 10.0*  HCT 30.2*  < > 27.2* 25.8* 27.0* 29.1* 28.8*  PLT 61*  < > 56* 69* 88* 116* 106*  MCV 100.5*  < > 102.9* 103.3* 105.0* 106.6* 106.0*  MCH 36.1*  < > 37.1* 37.0* 37.6* 37.1* 36.7*  MCHC 36.0  < > 36.0 35.8 35.8 34.8 34.6  RDW 19.0*  < > 21.1* 21.3* 22.3* 23.0* 22.6*  LYMPHSABS 0.6*  --   --   --   --   --   --   MONOABS 1.2*  --   --   --   --   --   --  EOSABS 0.1  --   --   --   --   --   --   BASOSABS 0.0  --   --   --   --   --   --   < > = values in this interval  not displayed.  Chemistries   Recent Labs Lab 02/01/15 0417 02/02/15 0356 02/03/15 0614 02/04/15 0600 02/05/15 0531  NA 130* 132* 131* 132* 133*  K 3.2* 3.5 3.2* 3.4* 3.0*  CL 94* 97* 96* 97* 97*  CO2 GLUCOSE 133* 98 93 115* 99  BUN CREATININE SEE COMMENTS SEE COMMENTS <0.30* ICTERUS AT THIS LEVEL MAY AFFECT RESULT UNABLE TO REPORT DUE TO SEVERE ICTERUS  CALCIUM 7.4* 7.5* 7.3* 7.3* 7.2*  AST 249* 169* 140* 126* 111*  ALT 63 53 57 52 56  ALKPHOS 143* 135* 123 123 126  BILITOT 40.8* 38.8* 39.7* 43.6* 42.1*   ------------------------------------------------------------------------------------------------------------------ CrCl cannot be calculated (Patient has no serum creatinine result on file.). ------------------------------------------------------------------------------------------------------------------ No results for input(s): HGBA1C in the last 72 hours. ------------------------------------------------------------------------------------------------------------------ No results for input(s): CHOL, HDL, LDLCALC, TRIG, CHOLHDL, LDLDIRECT in the last 72 hours. ------------------------------------------------------------------------------------------------------------------ No results for input(s): TSH, T4TOTAL, T3FREE, THYROIDAB in the last 72 hours.  Invalid input(s): FREET3 ------------------------------------------------------------------------------------------------------------------ No results for input(s): VITAMINB12, FOLATE, FERRITIN, TIBC, IRON, RETICCTPCT in the last 72 hours.  Coagulation profile  Recent Labs Lab 01/30/15 1823 01/31/15 0338 02/01/15 0934  INR 1.66 1.61 1.80    No results for input(s): DDIMER in the last 72 hours.  Cardiac Enzymes  Recent Labs Lab 01/30/15 1823  TROPONINI <0.03   ------------------------------------------------------------------------------------------------------------------ Invalid  input(s): POCBNP    Assessment & Plan   Principal Problem:  1. Alcoholic hepatitis;  continue prednisolone 40 mg daily, appreciate GI input, LFTs are stable,still jaundice,bilirubin up.2.  Alcohol use: CIWA protocol 3. Melena : On Protonix which will continue to monitor, hemoglobin stable for EGD /colonoscopy tomorrow 4. 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 Hyponatremia Due to liver cirrhosis, currently stable 6. large hematoma on his trunk. Monitor his hemoglobin,slowly resolving 7. Anemia Likely anemia of chronic disease due to his liver disease as well as some blood loss 8. Right upper quadrant abdominal pain, with ultrasound of the abdomen suggestive of possible gallbladder and inflammation however this is likely due to free fluid from his liver disease HIDA could not be done due to  Elevated Bilirubin,      Code Status Orders        Start     Ordered   01/31/15 0200  Full code   Continuous     01/31/15 0159        DVT Prophylaxis  SCDs   Lab Results  Component Value Date   PLT 106* 02/05/2015     Time Spent in minutes   35 minutes    Norbert Malkin M.D on 02/05/2015 at 12:48 PM  Between 7am to 6pm - Pager - [phone]  After 6pm go to [email or web address] - password [email or web address] [facility]  [facility]   Office  [phone]

## 2015-02-05 NOTE — Progress Notes (Signed)
Initial Nutrition Assessment  DOCUMENTATION CODES:     INTERVENTION:   (Meals and Snacks: Cater to patient preferences as able)  NUTRITION DIAGNOSIS:   (re-assess on follow)     GOAL:  Patient will meet greater than or equal to 90% of their needs (Diet Progression)   MONITOR:   (Energy Intake, Electrolyte and renal Profile, Hepatic Profikle, Digestive Profile, Diet Progression)  REASON FOR ASSESSMENT:   (RD Screen- LOS)    ASSESSMENT:  Reason For Admission: alcoholic hepatitis PMHx:  Past Medical History  Diagnosis Date  . Cirrhosis   . Hepatitis     Typical Fluid/ Food Intake: 100% of meals recorded per I/O on 5/29 Meal/ Snack Patterns: Unable to assess at this time  Supplements: None  Labs:  Electrolyte and Renal Profile:  Recent Labs Lab 02/03/15 0614 02/04/15 0600 02/05/15 0531  BUN '11 15 14  ' CREATININE <0.30* ICTERUS AT THIS LEVEL MAY AFFECT RESULT UNABLE TO REPORT DUE TO SEVERE ICTERUS  NA 131* 132* 133*  K 3.2* 3.4* 3.0*   Protein Profile:  Recent Labs Lab 02/03/15 0614 02/04/15 0600 02/05/15 0531  ALBUMIN 2.3* 2.4* 2.4*    Hepatic Function Latest Ref Rng 02/05/2015 02/04/2015 02/03/2015  Total Protein 6.5 - 8.1 g/dL 6.6 6.6 6.2(L)  Albumin 3.5 - 5.0 g/dL 2.4(L) 2.4(L) 2.3(L)  AST 15 - 41 U/L 111(H) 126(H) 140(H)  ALT 17 - 63 U/L 56 52 57  Alk Phosphatase 38 - 126 U/L 126 123 123  Total Bilirubin 0.3 - 1.2 mg/dL 42.1(HH) 43.6(HH) 39.7(HH)    Meds: NS w/ 20 KCl at 50/ hr, B-1  Physical Findings: n/a Weight Changes:  Review of previous medical records: Jan 2015= 182#; June 2014- 218 Weight fluctuations noted.    Height:  Ht Readings from Last 1 Encounters:  01/30/15 '5\' 8"'  (1.727 m)    Weight:  Wt Readings from Last 1 Encounters:  02/05/15 232 lb 9.6 oz (105.507 kg)    Ideal Body Weight:     Wt Readings from Last 10 Encounters:  02/05/15 232 lb 9.6 oz (105.507 kg)    BMI:  Body mass index is 35.38 kg/(m^2).  Skin:   Reviewed, no issues  Diet Order:  Diet clear liquid Room service appropriate?: Yes; Fluid consistency:: Thin  EDUCATION NEEDS:  No education needs identified at this time   Intake/Output Summary (Last 24 hours) at 02/05/15 1425 Last data filed at 02/05/15 1043  Gross per 24 hour  Intake    400 ml  Output   2150 ml  Net  -1750 ml    Last BM:  6/2  Derrick Blanchard, RDN Pager: [phone] Office: [place]

## 2015-02-05 NOTE — Progress Notes (Signed)
Pt is alert and oriented. VSS. Pain improved with PO pain medication on MAR. Denies nausea. Voiding without difficulty in urinal. Pt turned off bed alarm himself x1 tonight. Sleeping between care, will continue to monitor.

## 2015-02-05 NOTE — Progress Notes (Signed)
Clinical Education officer, museum (CSW) contacted Lanae Boast from [employer] and asked him to come see the patient. Per Lanae Boast he can come by in the morning around 8:15 am. CSW met with patient and asked if he was open to meeting with Lanae Boast about [place] services. Patient reported that he is agreeable to meeting with [place] in the morning. CSW will continue to follow and assist as needed.   Blima Rich, [place] [phone]

## 2015-02-05 NOTE — Progress Notes (Signed)
GI Inpatient Follow-up Note  Patient Identification: Derrick Blanchard is a 34 y.o. male with ETOH Cirrhosis, ETOH hepatitis.   Subjective:  No further bleeding.  Very hungry,wants to eat.   No darks stools. Some diffuse abd pain and welling.   Scheduled Inpatient Medications:  . Chlorhexidine Gluconate Cloth  6 each Topical Q0600  . lactulose  20 g Oral Daily  . LORazepam  0-4 mg Oral Q12H  . pantoprazole  40 mg Oral Q1200  . potassium chloride  40 mEq Oral BID  . prednisoLONE  40 mg Oral Daily  . thiamine  100 mg Intravenous Daily  . thiamine  100 mg Oral Daily    Continuous Inpatient Infusions:     PRN Inpatient Medications:  LORazepam, morphine injection, ondansetron **OR** ondansetron (ZOFRAN) IV, oxyCODONE     Physical Examination: BP 118/68 mmHg  Pulse 75  Temp(Src) 97.1 F (36.2 C) (Axillary)  Resp 18  Ht 5\' 8"  (1.727 m)  Wt 232 lb 9.6 oz (105.507 kg)  BMI 35.38 kg/m2  SpO2 100% Gen: NAD, alert and oriented x 4, + jaundiced HEENT: PEERLA, EOMI, Neck: supple, no JVD or thyromegaly Chest: CTA bilaterally, no wheezes, crackles, or other adventitious sounds CV: RRR, no m/g/c/r Abd: mild distension, +BS in all four quadrants; no HSM, guarding, ridigity, or rebound tenderness Ext: no edema, well perfused with 2+ pulses, Skin: no rash or lesions noted Lymph: no LAD  Data: Lab Results  Component Value Date   WBC 11.8* 02/05/2015   HGB 10.0* 02/05/2015   HCT 28.8* 02/05/2015   MCV 106.0* 02/05/2015   PLT 106* 02/05/2015    Recent Labs Lab 02/03/15 0614 02/04/15 0600 02/05/15 0531  HGB 9.7* 10.1* 10.0*   Lab Results  Component Value Date   NA 133* 02/05/2015   K 3.0* 02/05/2015   CL 97* 02/05/2015   CO2 28 02/05/2015   BUN 14 02/05/2015   CREATININE UNABLE TO REPORT DUE TO SEVERE ICTERUS 02/05/2015   Lab Results  Component Value Date   ALT 56 02/05/2015   AST 111* 02/05/2015   ALKPHOS 126 02/05/2015   BILITOT 42.1* 02/05/2015    Recent  Labs Lab 01/30/15 1823  02/01/15 0934  APTT 48*  --   --   INR 1.66  < > 1.80  < > = values in this interval not displayed.   Assessment/Plan: Derrick Blanchard is a 34 y.o. male with ETOH hepatitis and ETOH Cirrhosis.  No more bleeding and no change in Hgb since start of hospitalization - cont prednisolone 40 daily - complete ETOH detox - ETOH abstinence, counseling as outpt.  - low Na diet - GI clinic f/u - consider EGD / colon as outpatient, no indication currently based on completely stable and unchanged Hgb and no further bleeding.    Please call with questions or concerns.  Kirke Breach, Addison NaegeliMATTHEW GORDON, MD

## 2015-02-05 NOTE — Progress Notes (Signed)
Dr.Rein here to see patient. New orders received for diet change from clear liq to low sodium.

## 2015-02-05 NOTE — Progress Notes (Signed)
Pt has an order for isolation due to MRSA, RSV, and ESBL but pt only has is MRSA.  Order changed to order contact isolation only

## 2015-02-05 NOTE — Progress Notes (Signed)
Patient's alert and oriented, doing well today, hgb- stable 10, Ativan 1 mg po given x 1 see MAR per CIWA protocol, pain medication oxycodone given as needed see MAR with relief for abd pain. Up with 1 person assist to bathroom. Diet change from clear liq to low sodium- tolerated low sodium diet well.

## 2015-02-06 LAB — COMPREHENSIVE METABOLIC PANEL
ALT: 53 U/L (ref 17–63)
AST: 108 U/L — AB (ref 15–41)
Albumin: 2.4 g/dL — ABNORMAL LOW (ref 3.5–5.0)
Alkaline Phosphatase: 120 U/L (ref 38–126)
Anion gap: 8 (ref 5–15)
BILIRUBIN TOTAL: 42.9 mg/dL — AB (ref 0.3–1.2)
BUN: 14 mg/dL (ref 6–20)
CHLORIDE: 99 mmol/L — AB (ref 101–111)
CO2: 26 mmol/L (ref 22–32)
Calcium: 7.2 mg/dL — ABNORMAL LOW (ref 8.9–10.3)
Glucose, Bld: 91 mg/dL (ref 65–99)
POTASSIUM: 3.3 mmol/L — AB (ref 3.5–5.1)
Sodium: 133 mmol/L — ABNORMAL LOW (ref 135–145)
Total Protein: 6.7 g/dL (ref 6.5–8.1)

## 2015-02-06 MED ORDER — LORAZEPAM 1 MG PO TABS
1.0000 mg | ORAL_TABLET | Freq: Four times a day (QID) | ORAL | Status: DC | PRN
  Administered 2015-02-06 – 2015-02-07 (×4): 1 mg via ORAL
  Filled 2015-02-06 (×4): qty 1

## 2015-02-06 NOTE — Progress Notes (Signed)
GI Inpatient Follow-up Note  Patient Identification: Derrick Blanchard is a 34 y.o. male with severe ETOH hepatitis.   Subjective:  No change. No bleeding.  + abd pain.  No f/c.  [place] about relapse when he goes home.   Scheduled Inpatient Medications:  . lactulose  20 g Oral Daily  . pantoprazole  40 mg Oral Q1200  . prednisoLONE  40 mg Oral Daily  . thiamine  100 mg Intravenous Daily  . thiamine  100 mg Oral Daily    Continuous Inpatient Infusions:     PRN Inpatient Medications:  LORazepam, morphine injection, ondansetron **OR** ondansetron (ZOFRAN) IV, oxyCODONE    Physical Examination: BP 108/66 mmHg  Pulse 99  Temp(Src) 98.4 F (36.9 C) (Oral)  Resp 19  Ht 5\' 8"  (1.727 m)  Wt 232 lb 9.6 oz (105.507 kg)  BMI 35.38 kg/m2  SpO2 99% Gen: NAD, alert and oriented x 4, + jaundiced HEENT: PEERLA, EOMI, Neck: supple, no JVD or thyromegaly Chest: CTA bilaterally, no wheezes, crackles, or other adventitious sounds CV: RRR, no m/g/c/r Abd: mild distension, nabs, no r/g Ext:  1+ edema, well pref Skin: no rash or lesions noted, + jaundice Lymph: no LAD  Data: Lab Results  Component Value Date   WBC 11.8* 02/05/2015   HGB 10.0* 02/05/2015   HCT 28.8* 02/05/2015   MCV 106.0* 02/05/2015   PLT 106* 02/05/2015    Recent Labs Lab 02/03/15 0614 02/04/15 0600 02/05/15 0531  HGB 9.7* 10.1* 10.0*   Lab Results  Component Value Date   NA 133* 02/06/2015   K 3.3* 02/06/2015   CL 99* 02/06/2015   CO2 26 02/06/2015   BUN 14 02/06/2015   CREATININE SEE COMMENTS 02/06/2015   Lab Results  Component Value Date   ALT 53 02/06/2015   AST 108* 02/06/2015   ALKPHOS 120 02/06/2015   BILITOT 42.9* 02/06/2015    Recent Labs Lab 01/30/15 1823  02/05/15 1741  APTT 48*  --   --   INR 1.66  < > 1.72  < > = values in this interval not displayed.   Assessment/Plan: Mr. Derrick Blanchard is a 34 y.o. male with severe ETOH hepatitis.  No improvement on one week of prednisolone.   High risk for mortality.   - stop prednisolone since no improvement after one week. - cont lacutlose, tittrate to two stools daily - GI clinic f/u for consideration of EGD/ Colon,   Diuretics - needs close outpt monitoring of labs - ETOH abstinence     Please call with questions or concerns.  REIN, Addison NaegeliMATTHEW GORDON, MD

## 2015-02-06 NOTE — Progress Notes (Signed)
Pt is alert and oriented. VSS. Pain improved with PO pain medication on MAR. Anxiety managed with meds on MAR. Denies nausea. Voiding without difficulty in urinal/up to BR, having soft blood streaked stools. Sleeping between care, will continue to monitor.

## 2015-02-06 NOTE — Progress Notes (Signed)
[facility] [place] at [facility]                                                                                                                                                                                            Patient Demographics   Derrick Blanchard, is a 34 y.o. male, DOB - October 01, 1980, WUJ:[id number]  Admit date - 01/30/2015   Admitting Physician Crissie Figures, MD  Outpatient Primary MD for the patient is No primary care provider on file.  LOS - 7  Chief Complaint  Patient presents with  . Abdominal Pain  . Jaundice      Denies any abdominal pain.toelratign diet.  Review of Systems:   CONSTITUTIONAL: No documented fever. No fatigue, weakness. No weight gain, no weight loss.  EYES: No blurry or double vision.  ENT: No tinnitus. No postnasal drip. No redness of the oropharynx.  RESPIRATORY: No cough, no wheeze, no hemoptysis. No dyspnea.  CARDIOVASCULAR: No chest pain. No orthopnea. No palpitations. No syncope.  GASTROINTESTINAL: no  nausea, no vomiting or diarrhea. Right abdominal pain. Dark color stools. Positive Jaundice GENITOURINARY: No dysuria or hematuria.  ENDOCRINE: No polyuria or nocturia. No heat or cold intolerance.  HEMATOLOGY: No anemia. No bruising. No bleeding.  INTEGUMENTARY: No rashes. No lesions. Jaundice  MUSCULOSKELETAL: No arthritis. No swelling. No gout.  NEUROLOGIC: No numbness, tingling, or ataxia. No seizure-type activity.  PSYCHIATRIC: No anxiety. No insomnia. No ADD.    Vitals:   Filed Vitals:   02/05/15 1616 02/05/15 2141 02/06/15 0508 02/06/15 0924  BP: 118/68 107/69 118/64 108/66  Pulse: 75 78 96 99  Temp: 97.1 F (36.2 C) 97.6 F (36.4 C) 98.9 F (37.2 C) 98.4 F (36.9 C)  TempSrc: Axillary Oral Oral Oral  Resp: Height:      Weight:      SpO2: 100% 98% 96% 99%    Wt Readings from Last 3 Encounters:  02/05/15 105.507 kg (232 lb 9.6 oz)    No intake or output data in the 24  hours ending 02/06/15 1222  Physical Exam:   GENERAL: deeply jaundiced. HEAD, EYES, EARS, NOSE AND THROAT: Atraumatic, normocephalic. Extraocular muscles are intact. Pupils equal and reactive to light. Sclerae anicteric. No conjunctival injection. No oro-pharyngeal erythema.  NECK: Supple. There is no jugular venous distention. No bruits, no lymphadenopathy, no thyromegaly.  HEART: Regular rate and rhythm, tachycardic. No murmurs, no rubs, no clicks.  LUNGS: Clear to auscultation bilaterally. No rales or rhonchi. No wheezes.  ABDOMEN: Distended positive bowel sounds 4 , no guarding or rebound hematoma on elft lower  quadrant resolving,but tender to palpation.  EXTREMITIES: No evidence of any cyanosis, clubbing, or peripheral edema.  +2 pedal and radial pulses bilaterally.  NEUROLOGIC: The patient is alert, awake, and oriented x3 with no focal motor or sensory deficits appreciated bilaterally.  SKINHe is jaundiced   Psych: Not anxious, depressed LN: No inguinal LN enlargement    Antibiotics   Anti-infectives    None      Medications   Scheduled Meds: . lactulose  20 g Oral Daily  . pantoprazole  40 mg Oral Q1200  . prednisoLONE  40 mg Oral Daily  . thiamine  100 mg Intravenous Daily  . thiamine  100 mg Oral Daily   Continuous Infusions:   PRN Meds:.LORazepam, morphine injection, ondansetron **OR** ondansetron (ZOFRAN) IV, oxyCODONE   Data Review:   Micro Results Recent Results (from the past 240 hour(s))  MRSA PCR Screening     Status: Abnormal   Collection Time: 01/31/15  9:45 AM  Result Value Ref Range Status   MRSA by PCR POSITIVE (A) NEGATIVE Final    Comment:        The GeneXpert MRSA Assay (FDA approved for NASAL specimens only), is one component of a comprehensive MRSA colonization surveillance program. It is not intended to diagnose MRSA infection nor to guide or monitor treatment for MRSA infections. CRITICAL RESULT CALLED TO, READ BACK BY AND VERIFIED  WITH: ERIN MAYNOR ON 01/31/15 AT 1137 BY QSD     Radiology Reports Ct Abdomen Pelvis W Contrast  01/30/2015   CLINICAL DATA:  Status post fall in bathtub, with persistent left flank pain and bruising. Pain radiates to the left lower quadrant and back. Recent jaundice and inability to urinate. Current history of cirrhosis. Initial encounter.  EXAM: CT ABDOMEN AND PELVIS WITH CONTRAST  TECHNIQUE: Multidetector CT imaging of the abdomen and pelvis was performed using the standard protocol following bolus administration of intravenous contrast.  CONTRAST:  100mL OMNIPAQUE IOHEXOL 350 MG/ML SOLN  COMPARISON:  Right upper quadrant ultrasound performed 02/06/2013, and CT of the abdomen and pelvis from 02/10/2013  FINDINGS: The visualized lung bases are clear.  Extensive nodular heterogeneity is noted throughout the liver. This may reflect a combination of regenerative nodules and necrosis, though underlying malignancy cannot be excluded. The spleen is enlarged, measuring 17.2 cm in length. There is marked prominence of a recanalized umbilical vein, and collateral vessels along the anterior abdominal wall. Gastric, splenic and esophageal varices are seen.  The gallbladder is grossly unremarkable in appearance. Trace ascites is noted surrounding the liver and spleen. The pancreas and adrenal glands are grossly unremarkable.  The kidneys are unremarkable in appearance. There is no evidence of hydronephrosis. No renal or ureteral stones are seen. Mild nonspecific fluid is noted tracking along Gerota's fascia bilaterally.  The small bowel is unremarkable in appearance. The stomach is within normal limits. No acute vascular abnormalities are seen.  Mild soft tissue injury is noted at the left posterolateral flank, with minimal foci of hemorrhage.  The appendix is normal in caliber, without evidence of appendicitis.  There is mild diffuse wall thickening along much of the colon, with mild surrounding soft tissue  inflammation. This may reflect an acute infectious or inflammatory process, or could be seen with hypoproteinemia. Would correlate for associated symptoms.  The bladder is mildly distended and grossly unremarkable. The prostate remains normal in size. Trace free fluid is noted within the pelvis. No inguinal lymphadenopathy is seen.  No acute osseous abnormalities are identified. Chronic  bilateral pars defects are seen at L5, without evidence of anterolisthesis.  IMPRESSION: 1. Mild soft tissue injury noted at the left posterolateral flank, with minimal foci of soft tissue hemorrhage. 2. No additional evidence for traumatic injury to the abdomen or pelvis. 3. Extensive nodular heterogeneity throughout the liver. This may reflect a combination of regenerative nodules and necrosis in association with the patient's worsening cirrhosis, though underlying malignancy cannot be excluded. Would correlate with LFTs and consider dynamic liver protocol MRI or CT for further evaluation, as deemed clinically appropriate. 4. Mild diffuse wall thickening along much of the colon, with mild surrounding soft tissue inflammation. This could reflect an acute infectious or inflammatory process, or could be seen with hypoproteinemia. Would correlate for associated symptoms. 5. Splenomegaly noted. Marked prominence of a recanalized umbilical vein, and gastric, splenic and esophageal varices noted. 6. Trace ascites noted within the abdomen and pelvis. 7. Chronic bilateral pars defects at L5, without evidence of anterolisthesis.   Electronically Signed   By: Roanna Raider M.D.   On: 01/30/2015 20:54   Dg Chest Portable 1 View  01/30/2015   CLINICAL DATA:  Fall in bathtub yesterday  EXAM: PORTABLE CHEST - 1 VIEW  COMPARISON:  None.  FINDINGS: The heart size and mediastinal contours are within normal limits. Both lungs are clear. The visualized skeletal structures are unremarkable.  IMPRESSION: No active disease.   Electronically Signed    By: Alcide Clever M.D.   On: 01/30/2015 19:19   US Abdomen Limited Ruq  01/31/2015   CLINICAL DATA:  Alcoholic hepatitis  EXAM: US ABDOMEN LIMITED - RIGHT UPPER QUADRANT  COMPARISON:  CT 01/30/2015  FINDINGS: Gallbladder:  Dependent sludge or debris within the gallbladder, with trace pericholecystic fluid. Wall thickness 2 mm. No sonographic Murphy sign.  Common bile duct:  Diameter: 4 mm  Liver:  Hepatic increased echogenicity likely indicates underlying steatosis without focal abnormality or intrahepatic ductal dilatation.  Small ascites noted adjacent to the liver.  IMPRESSION: Dependent gallbladder sludge or debris with a small amount of pericholecystic fluid which could indicate acute cholecystitis although tracking of pericolonic ascites could appear similar.  Hepatic increased echogenicity likely indicates underlying steatosis without focal abnormality or intrahepatic ductal dilatation.   Electronically Signed   By: Christiana Pellant M.D.   On: 01/31/2015 09:34     CBC  Recent Labs Lab 01/30/15 1823  02/01/15 0934 02/02/15 0356 02/03/15 0614 02/04/15 0600 02/05/15 0531  WBC 12.1*  < > 8.0 9.2 10.1 12.5* 11.8*  HGB 10.9*  < > 9.8* 9.2* 9.7* 10.1* 10.0*  HCT 30.2*  < > 27.2* 25.8* 27.0* 29.1* 28.8*  PLT 61*  < > 56* 69* 88* 116* 106*  MCV 100.5*  < > 102.9* 103.3* 105.0* 106.6* 106.0*  MCH 36.1*  < > 37.1* 37.0* 37.6* 37.1* 36.7*  MCHC 36.0  < > 36.0 35.8 35.8 34.8 34.6  RDW 19.0*  < > 21.1* 21.3* 22.3* 23.0* 22.6*  LYMPHSABS 0.6*  --   --   --   --   --   --   MONOABS 1.2*  --   --   --   --   --   --   EOSABS 0.1  --   --   --   --   --   --   BASOSABS 0.0  --   --   --   --   --   --   < > = values in this interval not  displayed.  Chemistries   Recent Labs Lab 02/02/15 0356 02/03/15 0614 02/04/15 0600 02/05/15 0531 02/06/15 0840  NA 132* 131* 132* 133* 133*  K 3.5 3.2* 3.4* 3.0* 3.3*  CL 97* 96* 97* 97* 99*  CO2 GLUCOSE 98 93 115* 99 91  BUN CREATININE SEE COMMENTS <0.30* ICTERUS AT THIS LEVEL MAY AFFECT RESULT UNABLE TO REPORT DUE TO SEVERE ICTERUS SEE COMMENTS  CALCIUM 7.5* 7.3* 7.3* 7.2* 7.2*  AST 169* 140* 126* 111* 108*  ALT 53 57 52 56 53  ALKPHOS 135* 123 123 126 120  BILITOT 38.8* 39.7* 43.6* 42.1* 42.9*   ------------------------------------------------------------------------------------------------------------------ CrCl cannot be calculated (Patient has no serum creatinine result on file.). ------------------------------------------------------------------------------------------------------------------ No results for input(s): HGBA1C in the last 72 hours. ------------------------------------------------------------------------------------------------------------------ No results for input(s): CHOL, HDL, LDLCALC, TRIG, CHOLHDL, LDLDIRECT in the last 72 hours. ------------------------------------------------------------------------------------------------------------------ No results for input(s): TSH, T4TOTAL, T3FREE, THYROIDAB in the last 72 hours.  Invalid input(s): FREET3 ------------------------------------------------------------------------------------------------------------------ No results for input(s): VITAMINB12, FOLATE, FERRITIN, TIBC, IRON, RETICCTPCT in the last 72 hours.  Coagulation profile  Recent Labs Lab 01/30/15 1823 01/31/15 0338 02/01/15 0934 02/05/15 1741  INR 1.66 1.61 1.80 1.72    No results for input(s): DDIMER in the last 72 hours.  Cardiac Enzymes  Recent Labs Lab 01/30/15 1823  TROPONINI <0.03   ------------------------------------------------------------------------------------------------------------------ Invalid input(s): POCBNP    Assessment & Plan   Principal Problem:  1. Alcoholic hepatitis; still severely jaundiced,AST ,ALt are improving but TB still high continue prednisolone 40 mg daily, appreciate GI input, l   Alcohol use: CIWA protocol  finished.continue PRN ativan  3. Melena : On Protonix which will continue to monitor, hemoglobin stable for EGD /colonoscopy  As an out pt 4. 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 Hyponatremia Due to liver cirrhosis, currently stable 6. large hematoma on his trunk. Monitor his hemoglobin,slowly resolving 7. Anemia Likely anemia of chronic disease due to his liver disease as well as some blood loss 8. Right upper quadrant abdominal pain, with ultrasound of the abdomen suggestive of possible gallbladder and inflammation however this is likely due to free fluid from his liver disease HIDA could not be done due to  Elevated Bilirubin,      Code Status Orders        Start     Ordered   01/31/15 0200  Full code   Continuous     01/31/15 0159        DVT Prophylaxis  SCDs   Lab Results  Component Value Date   PLT 106* 02/05/2015     Time Spent in minutes   35 minutes    Zayvion Stailey M.D on 02/06/2015 at 12:22 PM  Between 7am to 6pm - Pager - [phone]  After 6pm go to [email or web address] - password [email or web address] [facility]  [facility]   Office  [phone]

## 2015-02-06 NOTE — Progress Notes (Addendum)
CRITICAL LAB  Bilirubin 42.9 called to Dr. Luberta MutterKonidena, no new orders, she said she was coming to see him

## 2015-02-06 NOTE — Progress Notes (Signed)
Lanae Boast from [facility] met with patient this morning to discuss outpatient substance abuse and mental health resources. Clinical Social Worker (CSW) will continue to follow and assist as needed.   Blima Rich, [place] [phone]

## 2015-02-07 LAB — COMPREHENSIVE METABOLIC PANEL
ALT: 50 U/L (ref 17–63)
AST: 92 U/L — ABNORMAL HIGH (ref 15–41)
Albumin: 2.1 g/dL — ABNORMAL LOW (ref 3.5–5.0)
Alkaline Phosphatase: 100 U/L (ref 38–126)
Anion gap: 6 (ref 5–15)
BUN: 13 mg/dL (ref 6–20)
CHLORIDE: 100 mmol/L — AB (ref 101–111)
CO2: 26 mmol/L (ref 22–32)
Calcium: 6.9 mg/dL — ABNORMAL LOW (ref 8.9–10.3)
Glucose, Bld: 93 mg/dL (ref 65–99)
POTASSIUM: 3.3 mmol/L — AB (ref 3.5–5.1)
Sodium: 132 mmol/L — ABNORMAL LOW (ref 135–145)
Total Bilirubin: 37.2 mg/dL (ref 0.3–1.2)
Total Protein: 5.7 g/dL — ABNORMAL LOW (ref 6.5–8.1)

## 2015-02-07 MED ORDER — LORAZEPAM 1 MG PO TABS: 1.0000 mg | ORAL_TABLET | Freq: Four times a day (QID) | ORAL | Status: DC | PRN

## 2015-02-07 MED ORDER — PREDNISONE 20 MG PO TABS: ORAL_TABLET | ORAL | Status: DC

## 2015-02-07 MED ORDER — LACTULOSE 10 GM/15ML PO SOLN: 20.0000 g | Freq: Every day | ORAL | Status: DC

## 2015-02-07 MED ORDER — PANTOPRAZOLE SODIUM 40 MG PO TBEC: 40.0000 mg | DELAYED_RELEASE_TABLET | Freq: Every day | ORAL | Status: DC

## 2015-02-07 MED ORDER — OXYCODONE HCL 5 MG PO TABS: 5.0000 mg | ORAL_TABLET | Freq: Four times a day (QID) | ORAL | Status: DC | PRN

## 2015-02-07 NOTE — Progress Notes (Signed)
CRITICAL VALUE ALERT  Critical value received:  Total Biliruben 37.2  Date of notification:  02/07/2015  Time of notification:  5:47 AM   Critical value read back:Yes.    Nurse who received alert:  Tyler PitaBecky Reap, RN  MD notified (1st page):  [employer]  Time of first page:  5:48 AM   MD notified (2nd page):  Time of second page:  Responding MD:  Dr. Sheryle Hailiamond  Time MD responded:  5:50 AM  Orders: none

## 2015-02-07 NOTE — Progress Notes (Signed)
Completed discharge instructions with patient and patients father present in the room.  Gave Prescriptions to father so that they could get them filled.  No further questions.

## 2015-02-07 NOTE — Discharge Summary (Signed)
Derrick Blanchard, is a 34 y.o. male  DOB 1980-09-17  MRN [id number].  Admission date:  01/30/2015  Admitting Physician  Crissie Figures, MD  Discharge Date:  02/07/2015   Primary MD  No primary care provider on file.  Recommendations for primary care physician for things to follow:      Admission Diagnosis  Jaundice [R17] Withdrawal symptoms, alcohol, uncomplicated [F10.230] Gastrointestinal hemorrhage with melena [K92.1]   Discharge Diagnosis  Jaundice [R17] Withdrawal symptoms, alcohol, uncomplicated [F10.230] Gastrointestinal hemorrhage with melena [K92.1]    Principal Problem:   Alcoholic hepatitis Active Problems:   Alcohol use   Melena   Coagulopathy   Hyponatremia   Bruise, trunk   Anemia   Alcohol dependence   Alcohol dependence with uncomplicated withdrawal      Past Medical History  Diagnosis Date  . Cirrhosis   . Hepatitis     Past Surgical History  Procedure Laterality Date  . Esophagogastroduodenoscopy (egd) with propofol N/A 02/03/2015    Procedure: ESOPHAGOGASTRODUODENOSCOPY (EGD) WITH PROPOFOL;  Surgeon: Elnita Maxwell, MD;  Location: [facility] ENDOSCOPY;  Service: Endoscopy;  Laterality: N/A;       History of present illness and  Hospital Course:     Kindly see H&P for history of present illness and admission details, please review complete Labs, Consult reports and Test reports for all details in brief  HPI  from the history and physical done on the day of admission 34 year old male with history of fall colic and use alcoholic hepatitis came in because of left-sided abdominal pain abdominal distention and jaundice 7434 days. Patient abdominal CT is negative for intra-abdominal injury, blood collection. However has significant jaundice with total bilirubin 38.9 with elevated  LFTs, hyponatremia with sodium 120. 4 and ammonia level was 62 on admission. Patient admitted to hospitalist service secondary to severe jaundice, alcoholic hepatitis, hyponatremia, coagulopathy secondary to liver cirrhosis, hematoma of the left lateral abdominal wall secondary to fall,  Patient also monitored for  [facility] protocol   Hospital Course 1. HEPATITIS with jaundice: Patient seen by gastroenterology Dr. Trixie Deis, started on prednisone 40 mg daily for high discrimination factor. Patient will need 40 mg prednisone 28 days, they have to taper it slowly he'll be needing 30 mg for 1 week and 20 mg for 1 week and 10 mg over one week after that. I spoke with gastroenterologist Dr. Frederich Cha he will see the patient as an outpatient. Patient does have cirrhosis liver or arm secondary to alcohol abuse, coagulopathy and severe jaundice. His  AST and admission 378, total bilirubin  38.9. Sodium 120.patient AST is 92 today. Bilirubin today 37.2. 2. Hyponatremia secondary to liver cirrhosis and the report ammonia sodium improved to 132 today. Coagulopathy secondary to liver cirrhosis. Abdominal ultrasound didn't show any abnormal gallstones. Did have some gallbladder sludge but could not have INR scan secondary to severe jaundice. Patient not candidate for liver transplant secondary to alcohol abuse. Dr. Trixie Deis will follow the patient is an outpatient. 3. Alcohol abuse patient monitored with Cipro protocol. Required Ativan right now he finished withdrawal protocol, will discharge him with Ativan when necessary. In by psychiatrically from Dr. Toni Amend. Not interested indetox programs. #4 history of fall with abdominal wall hematoma with resolving at this time but continued to have some pain so he is needing some pain medications as needed. 5.Malena; patient had some blood in the stool. Was taken to EGD but he initially was unsuccessful secondary to a lot of food in stomach. His  hemoglobin stays stable  ,received  IV Protonix for some time. Dr. Alycia Rossetti follow as an outpatient for repeat EGD and colonoscopy as an outpatient. #6: Regarding HEPATITIS, rectal bleeding is hemoglobin stays stable patient needs to be on prednisone, lactulose. He needs to have complete cessation of alcohol. Discussed about it. Hemoglobin is stable at 10.       Discharge Condition:   Follow UP  Follow-up Information    Follow up with REIN, Addison Naegeli, MD In 1 week.   Specialty:  Gastroenterology   Contact information:   [phone] [facility] [address] [facility] [place] [address] [phone]         Discharge Instructions  and  Discharge Medications        Medication List    TAKE these medications        lactulose 10 GM/15ML solution  Commonly known as:  CHRONULAC  Take 30 mLs (20 g total) by mouth daily.     LORazepam 1 MG tablet  Commonly known as:  ATIVAN  Take 1 tablet (1 mg total) by mouth every 6 (six) hours as needed for anxiety.     oxyCODONE 5 MG immediate release tablet  Commonly known as:  Oxy IR/ROXICODONE  Take 1-2 tablets (5-10 mg total) by mouth every 6 (six) hours as needed for moderate pain.     pantoprazole 40 MG tablet  Commonly known as:  PROTONIX  Take 1 tablet (40 mg total) by mouth daily at 12 noon.          Diet and Activity recommendation: See Discharge Instructions above   Consults obtained -gastroenterology   Major procedures and Radiology Reports - PLEASE review detailed and final reports for all details, in brief -      Ct Abdomen Pelvis W Contrast  01/30/2015   CLINICAL DATA:  Status post fall in bathtub, with persistent left flank pain and bruising. Pain radiates to the left lower quadrant and back. Recent jaundice and inability to urinate. Current history of cirrhosis. Initial encounter.  EXAM: CT ABDOMEN AND PELVIS WITH CONTRAST  TECHNIQUE: Multidetector CT imaging of the abdomen and pelvis was performed using the standard protocol  following bolus administration of intravenous contrast.  CONTRAST:  OMNIPAQUE IOHEXOL 350 MG/ML SOLN  COMPARISON:  Right upper quadrant ultrasound performed 02/06/2013, and CT of the abdomen and pelvis from 02/10/2013  FINDINGS: The visualized lung bases are clear.  Extensive nodular heterogeneity is noted throughout the liver. This may reflect a combination of regenerative nodules and  necrosis, though underlying malignancy cannot be excluded. The spleen is enlarged, measuring 17.2 cm in length. There is marked prominence of a recanalized umbilical vein, and collateral vessels along the anterior abdominal wall. Gastric, splenic and esophageal varices are seen.  The gallbladder is grossly unremarkable in appearance. Trace ascites is noted surrounding the liver and spleen. The pancreas and adrenal glands are grossly unremarkable.  The kidneys are unremarkable in appearance. There is no evidence of hydronephrosis. No renal or ureteral stones are seen. Mild nonspecific fluid is noted tracking along Gerota's fascia bilaterally.  The small bowel is unremarkable in appearance. The stomach is within normal limits. No acute vascular abnormalities are seen.  Mild soft tissue injury is noted at the left posterolateral flank, with minimal foci of hemorrhage.  The appendix is normal in caliber, without evidence of appendicitis.  There is mild diffuse wall thickening along much of the colon, with mild surrounding soft tissue inflammation. This may reflect an acute infectious or inflammatory process, or could be seen with hypoproteinemia. Would correlate for associated symptoms.  The bladder is mildly distended and grossly unremarkable. The prostate remains normal in size. Trace free fluid is noted within the pelvis. No inguinal lymphadenopathy is seen.  No acute osseous abnormalities are identified. 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  IMPRESSION: 1. Mild soft tissue injury  noted at the left posterolateral flank, with minimal foci of soft tissue hemorrhage. 2. No additional evidence for traumatic injury to the abdomen or pelvis. 3. Extensive nodular heterogeneity throughout the liver. This may reflect a combination of regenerative nodules and necrosis in association with the patient's worsening cirrhosis, though underlying malignancy cannot be excluded. Would correlate with LFTs and consider dynamic liver protocol MRI or CT for further evaluation, as deemed clinically appropriate. 4. Mild diffuse wall thickening along much of the colon, with mild surrounding soft tissue inflammation. This could reflect an acute infectious or inflammatory process, or could be seen with hypoproteinemia. Would correlate for associated symptoms. 5. Splenomegaly noted. Marked prominence of a recanalized umbilical vein, and gastric, splenic and esophageal varices noted. 6. Trace ascites noted within the abdomen and pelvis. 7. Chronic bilateral pars defects at L5, without evidence of anterolisthesis.   Electronically Signed   By: Roanna Raider M.D.   On: 01/30/2015 20:54   Dg Chest Portable 1 View  01/30/2015   CLINICAL DATA:  Fall in bathtub yesterday  EXAM: PORTABLE CHEST - 1 VIEW  COMPARISON:  None.  FINDINGS: The heart size and mediastinal contours are within normal limits. Both lungs are clear. The visualized skeletal structures are unremarkable.  IMPRESSION: No active disease.   Electronically Signed   By: Alcide Clever M.D.   On: 01/30/2015 19:19   US Abdomen Limited Ruq  01/31/2015   CLINICAL DATA:  Alcoholic hepatitis  EXAM: US ABDOMEN LIMITED - RIGHT UPPER QUADRANT  COMPARISON:  CT 01/30/2015  FINDINGS: Gallbladder:  Dependent sludge or debris within the gallbladder, with trace pericholecystic fluid. Wall thickness 2 mm. No sonographic Murphy sign.  Common bile duct:  Diameter: 4 mm  Liver:  Hepatic increased echogenicity likely indicates underlying steatosis without focal abnormality or  intrahepatic ductal dilatation.  Small ascites noted adjacent to the liver.  IMPRESSION: Dependent gallbladder sludge or debris with a small amount of pericholecystic fluid which could indicate acute cholecystitis although tracking of pericolonic ascites could appear similar.  Hepatic increased echogenicity likely indicates underlying steatosis without focal abnormality or intrahepatic ductal dilatation.  Electronically Signed   By: Christiana PellantGretchen  Green M.D.   On: 01/31/2015 09:34    Micro Results    Recent Results (from the past 240 hour(s))  MRSA PCR Screening     Status: Abnormal   Collection Time: 01/31/15  9:45 AM  Result Value Ref Range Status   MRSA by PCR POSITIVE (A) NEGATIVE Final    Comment:        The GeneXpert MRSA Assay (FDA approved for NASAL specimens only), is one component of a comprehensive MRSA colonization surveillance program. It is not intended to diagnose MRSA infection nor to guide or monitor treatment for MRSA infections. CRITICAL RESULT CALLED TO, READ BACK BY AND VERIFIED WITH: ERIN MAYNOR ON 01/31/15 AT 1137 BY QSD        Today   Subjective:   Wallene DalesChristopher Vanputten today  has jaundice and some generalized pruritus but denies any other complaints. Stable for discharge.  Objective:   Blood pressure 115/63, pulse 93, temperature 98.5 F (36.9 C), temperature source Oral, resp. rate 18, height 5\' 8"  (1.727 m), weight 105.507 kg (232 lb 9.6 oz), SpO2 98 %.  No intake or output data in the 24 hours ending 02/07/15 0748  Exam Awake Alert, Oriented x 3, No new F.N deficits, Normal affect [place].AT,PERRAL Supple Neck,No JVD, No cervical lymphadenopathy appriciated.  Symmetrical Chest wall movement, Good air movement bilaterally, CTAB RRR,No Gallops,Rubs or new Murmurs, No Parasternal Heave +ve B.Sounds, Abd Soft, Non tender, No organomegaly appriciated, No rebound -guarding or rigidity. No Cyanosis, Clubbing or edema, No new Rash or bruise  Data Review   CBC  w Diff:  Lab Results  Component Value Date   WBC 11.8* 02/05/2015   WBC 5.3 09/20/2013   HGB 10.0* 02/05/2015   HGB 12.1* 09/20/2013   HCT 28.8* 02/05/2015   HCT 36.8* 09/20/2013   PLT 106* 02/05/2015   PLT 97* 09/20/2013   LYMPHOPCT 5* 01/30/2015   LYMPHOPCT 23.2 09/20/2013   BANDSPCT 0 01/30/2015   MONOPCT 10 01/30/2015   MONOPCT 9.0 09/20/2013   EOSPCT 1 01/30/2015   EOSPCT 1.7 09/20/2013   BASOPCT 0 01/30/2015   BASOPCT 0.5 09/20/2013    CMP:  Lab Results  Component Value Date   NA 132* 02/07/2015   NA 138 02/12/2013   K 3.3* 02/07/2015   K 4.7 02/12/2013   CL 100* 02/07/2015   CL 105 02/12/2013   CO2 26 02/07/2015   CO2 27 02/12/2013   BUN 13 02/07/2015   BUN 16 02/12/2013   CREATININE SEE COMMENTS 02/07/2015   CREATININE 0.51* 02/12/2013   PROT 5.7* 02/07/2015   PROT 6.1* 02/11/2013   ALBUMIN 2.1* 02/07/2015   ALBUMIN 2.3* 02/11/2013   BILITOT 37.2* 02/07/2015   ALKPHOS 100 02/07/2015   ALKPHOS 129 02/11/2013   AST 92* 02/07/2015   AST 105* 02/11/2013   ALT 50 02/07/2015   ALT 44 02/11/2013  .   Total Time in preparing paper work, data evaluation and todays exam - 35 minutes  Vergil Burby M.D on 02/07/2015 at 7:48 AM

## 2015-02-07 NOTE — Progress Notes (Signed)
Pt is alert and oriented. VSS. Pain and anxiety improved with PO medication on MAR. Denies nausea. Voiding without difficulty in urinal/up to BR. Plan for discharge today. Sleeping between care, will continue to monitor.

## 2015-09-09 LAB — TYPE AND SCREEN
ABO/RH(D): O POS
ANTIBODY SCREEN: NEGATIVE
UNIT DIVISION: 0
UNIT DIVISION: 0

## 2017-05-12 ENCOUNTER — Emergency Department
Admission: EM | Admit: 2017-05-12 | Discharge: 2017-05-12 | Disposition: A | Discharge status: Home / Self Care | Payer: Medicaid Other | Attending: Emergency Medicine | Admitting: Emergency Medicine

## 2017-05-12 ENCOUNTER — Encounter: Payer: Self-pay | Admitting: Emergency Medicine

## 2017-05-12 DIAGNOSIS — S3994XA Unspecified injury of external genitals, initial encounter: Secondary | ICD-10-CM | POA: Diagnosis not present

## 2017-05-12 DIAGNOSIS — Y999 Unspecified external cause status: Secondary | ICD-10-CM | POA: Insufficient documentation

## 2017-05-12 DIAGNOSIS — F17210 Nicotine dependence, cigarettes, uncomplicated: Secondary | ICD-10-CM | POA: Diagnosis not present

## 2017-05-12 DIAGNOSIS — Y9389 Activity, other specified: Secondary | ICD-10-CM | POA: Diagnosis not present

## 2017-05-12 DIAGNOSIS — Y929 Unspecified place or not applicable: Secondary | ICD-10-CM | POA: Diagnosis not present

## 2017-05-12 DIAGNOSIS — N4889 Other specified disorders of penis: Secondary | ICD-10-CM | POA: Diagnosis present

## 2017-05-12 DIAGNOSIS — W2209XA Striking against other stationary object, initial encounter: Secondary | ICD-10-CM | POA: Insufficient documentation

## 2017-05-12 DIAGNOSIS — S3021XA Contusion of penis, initial encounter: Secondary | ICD-10-CM | POA: Diagnosis not present

## 2017-05-12 LAB — URINALYSIS, COMPLETE (UACMP) WITH MICROSCOPIC
BACTERIA UA: NONE SEEN
Bilirubin Urine: NEGATIVE
Glucose, UA: NEGATIVE mg/dL
Ketones, ur: NEGATIVE mg/dL
Leukocytes, UA: NEGATIVE
Nitrite: NEGATIVE
Protein, ur: NEGATIVE mg/dL
RBC / HPF: NONE SEEN RBC/hpf (ref 0–5)
SPECIFIC GRAVITY, URINE: 1.001 — AB (ref 1.005–1.030)
Squamous Epithelial / LPF: NONE SEEN
pH: 7 (ref 5.0–8.0)

## 2017-05-12 MED ORDER — IBUPROFEN 200 MG PO TABS: 600.0000 mg | ORAL_TABLET | Freq: Four times a day (QID) | ORAL | 0 refills | Status: DC | PRN

## 2017-05-12 MED ORDER — OXYCODONE HCL 5 MG PO TABS
5.0000 mg | ORAL_TABLET | Freq: Once | ORAL | Status: AC
  Administered 2017-05-12: 5 mg via ORAL
  Filled 2017-05-12: qty 1

## 2017-05-12 MED ORDER — HYDROMORPHONE HCL 1 MG/ML IJ SOLN: 0.5000 mg | INTRAMUSCULAR | Status: DC
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
## 2017-05-12 NOTE — Discharge Instructions (Signed)
Take anti-inflammatory medicine to help control pain and swelling, and avoid sex or trauma for the next few weeks until your penis has returned to normal.  Follow up with urology if your symptoms worsen in any way.  Return to the ER if you have severe uncontrolled pain or inability to urinate.

## 2017-05-12 NOTE — ED Notes (Signed)
Pt discharged to home.  Family member driving.  Discharge instructions reviewed.  Verbalized understanding.  No questions or concerns at this time.  Teach back verified.  Pt in NAD.  No items left in ED.   

## 2017-05-12 NOTE — Consult Note (Signed)
05/12/2017 4:05 PM   Randall Hiss 1981/07/27 [id number]  Referring provider: Dr. Edwena Bunde  Chief Complaint  Patient presents with  . Groin Swelling    HPI: The patient is a 36 year old male who presents to the emergency department with a 2 day history of swelling and bruising in his groin. It all started after having very vigorous intercourse with his girlfriend. At one point, he hit his suprapubic area on the side of the table. During these episodes, he was able to obtain an erection the entire time. He never had a sudden pop or detumescence of his erection at this time. The next morning he woke up with bruising in his suprapubic region but his scrotum and testicles are relatively normal. Over the last 2 days the bruising that started in the suprapubic region extended down into his penis causing bruising of this penile skin. He is uncircumcised but his foreskin is reduced. He is having pain particularly in the suprapubic region. He has to strain to urinate but has no hematuria. He has voided twice in the ER.     PMH: Past Medical History:  Diagnosis Date  . Cirrhosis (HCC)   . Hepatitis     Surgical History: Past Surgical History:  Procedure Laterality Date  . ESOPHAGOGASTRODUODENOSCOPY (EGD) WITH PROPOFOL N/A 02/03/2015   Procedure: ESOPHAGOGASTRODUODENOSCOPY (EGD) WITH PROPOFOL;  Surgeon: Elnita Maxwell, MD;  Location: [facility] ENDOSCOPY;  Service: Endoscopy;  Laterality: N/A;    Allergies: No Known Allergies  Family History: No family history on file.  Social History:  reports that he has been smoking Cigarettes.  He has never used smokeless tobacco. He reports that he drinks alcohol. He reports that he uses drugs, including Marijuana.  ROS: 12 point ROS negative except for above  Physical Exam: BP 119/79 (BP Location: Left Arm)   Pulse (!) 112   Temp 98.4 F (36.9 C) (Oral)   Resp 16   Ht  (1.727 m)   Wt 200 lb (90.7 kg)   SpO2 98%   BMI 30.41  kg/m   Constitutional:  Alert and oriented, No acute distress. HEENT:  AT, moist mucus membranes.  Trachea midline, no masses. Cardiovascular: No clubbing, cyanosis, or edema. Respiratory: Normal respiratory effort, no increased work of breathing. GI: Abdomen is soft, nontender, nondistended, no abdominal masses GU: No CVA tenderness. Hematoma and tenderness above the base of the penis. It is tender to touch. There is bruising and edema of the entire penile shaft including the foreskin. The foreskin is reduced. Penis is nontender to palpation. Testicles descended bilaterally with no masses and benign. Skin: No rashes, bruises or suspicious lesions. Lymph: No cervical or inguinal adenopathy. Neurologic: Grossly intact, no focal deficits, moving all 4 extremities. Psychiatric: Normal mood and affect.  Laboratory Data: Lab Results  Component Value Date   WBC 11.8 (H) 02/05/2015   HGB 10.0 (L) 02/05/2015   HCT 28.8 (L) 02/05/2015   MCV 106.0 (H) 02/05/2015   PLT 106 (L) 02/05/2015    Lab Results  Component Value Date   CREATININE SEE COMMENTS 02/07/2015    No results found for: PSA  No results found for: TESTOSTERONE  Lab Results  Component Value Date   HGBA1C < 3.5 (L) 02/03/2013    Urinalysis    Component Value Date/Time   COLORURINE YELLOW (A) 05/12/2017 1510   APPEARANCEUR CLEAR (A) 05/12/2017 1510   APPEARANCEUR Clear 02/01/2013 1851   LABSPEC 1.001 (L) 05/12/2017 1510   LABSPEC 1.015 02/01/2013  1851   PHURINE 7.0 05/12/2017 1510   GLUCOSEU NEGATIVE 05/12/2017 1510   GLUCOSEU 50 mg/dL 13/08/657805/30/2014 [id number]   HGBUR SMALL (A) 05/12/2017 1510   BILIRUBINUR NEGATIVE 05/12/2017 1510   BILIRUBINUR 2+ 02/01/2013 1851   KETONESUR NEGATIVE 05/12/2017 1510   PROTEINUR NEGATIVE 05/12/2017 1510   NITRITE NEGATIVE 05/12/2017 1510   LEUKOCYTESUR NEGATIVE 05/12/2017 1510   LEUKOCYTESUR Negative 02/01/2013 1851     Assessment & Plan:    1. Penile/suprapubic hematoma The  patient has an apparent hematoma that started in the suprapubic region and dependently involves his penile shaft. Currently his foreskin is reduced. He denied any sudden loss of detumescence or popping during intercourse, so a penile fracture does not seem likely. Also the pathology does appear to originate in the suprapubic region especially in terms of tenderness. At this time, would recommend conservative management with pain control. The patient was advised that the swelling and bruising will take at least a few weeks to fully resolve. He will follow-up in our office if he has any continued concerns.  Hildred LaserBrian James Kambri Dismore, MD  [facility] [address], [address] [place], [place] [address] [phone]

## 2017-05-19 ENCOUNTER — Emergency Department
Admission: EM | Admit: 2017-05-19 | Discharge: 2017-05-20 | Disposition: A | Discharge status: Other Acute Inpatient Hospital | Payer: Medicaid Other | Attending: Emergency Medicine | Admitting: Emergency Medicine

## 2017-05-19 ENCOUNTER — Emergency Department: Payer: Medicaid Other

## 2017-05-19 DIAGNOSIS — R2231 Localized swelling, mass and lump, right upper limb: Secondary | ICD-10-CM | POA: Diagnosis not present

## 2017-05-19 DIAGNOSIS — R609 Edema, unspecified: Secondary | ICD-10-CM

## 2017-05-19 DIAGNOSIS — R52 Pain, unspecified: Secondary | ICD-10-CM

## 2017-05-19 DIAGNOSIS — F10929 Alcohol use, unspecified with intoxication, unspecified: Secondary | ICD-10-CM

## 2017-05-19 DIAGNOSIS — M79601 Pain in right arm: Secondary | ICD-10-CM

## 2017-05-19 DIAGNOSIS — F17210 Nicotine dependence, cigarettes, uncomplicated: Secondary | ICD-10-CM | POA: Diagnosis not present

## 2017-05-19 DIAGNOSIS — F10129 Alcohol abuse with intoxication, unspecified: Secondary | ICD-10-CM | POA: Insufficient documentation

## 2017-05-19 LAB — URINALYSIS, COMPLETE (UACMP) WITH MICROSCOPIC
Bacteria, UA: NONE SEEN
Bilirubin Urine: NEGATIVE
Glucose, UA: NEGATIVE mg/dL
KETONES UR: NEGATIVE mg/dL
Leukocytes, UA: NEGATIVE
Nitrite: NEGATIVE
PROTEIN: 30 mg/dL — AB
SQUAMOUS EPITHELIAL / LPF: NONE SEEN
Specific Gravity, Urine: 1.005 (ref 1.005–1.030)
pH: 7 (ref 5.0–8.0)

## 2017-05-19 LAB — COMPREHENSIVE METABOLIC PANEL
ALT: 64 U/L — ABNORMAL HIGH (ref 17–63)
ANION GAP: 10 (ref 5–15)
AST: 200 U/L — AB (ref 15–41)
Albumin: 3.3 g/dL — ABNORMAL LOW (ref 3.5–5.0)
Alkaline Phosphatase: 469 U/L — ABNORMAL HIGH (ref 38–126)
BUN: 6 mg/dL (ref 6–20)
CHLORIDE: 99 mmol/L — AB (ref 101–111)
CO2: 26 mmol/L (ref 22–32)
Calcium: 8.9 mg/dL (ref 8.9–10.3)
Creatinine, Ser: 0.31 mg/dL — ABNORMAL LOW (ref 0.61–1.24)
Glucose, Bld: 150 mg/dL — ABNORMAL HIGH (ref 65–99)
POTASSIUM: 3.5 mmol/L (ref 3.5–5.1)
Sodium: 135 mmol/L (ref 135–145)
Total Bilirubin: 3.3 mg/dL — ABNORMAL HIGH (ref 0.3–1.2)
Total Protein: 7.6 g/dL (ref 6.5–8.1)

## 2017-05-19 LAB — ETHANOL: ALCOHOL ETHYL (B): 356 mg/dL — AB (ref <5)

## 2017-05-19 LAB — CBC WITH DIFFERENTIAL/PLATELET
BASOS ABS: 0.1 10*3/uL (ref 0–0.1)
Basophils Relative: 1 %
Eosinophils Absolute: 0.1 10*3/uL (ref 0–0.7)
Eosinophils Relative: 1 %
HCT: 36.3 % — ABNORMAL LOW (ref 40.0–52.0)
HEMOGLOBIN: 12.6 g/dL — AB (ref 13.0–18.0)
LYMPHS ABS: 1.3 10*3/uL (ref 1.0–3.6)
LYMPHS PCT: 8 %
MCH: 31.3 pg (ref 26.0–34.0)
MCHC: 34.6 g/dL (ref 32.0–36.0)
MCV: 90.5 fL (ref 80.0–100.0)
Monocytes Absolute: 2 10*3/uL — ABNORMAL HIGH (ref 0.2–1.0)
Monocytes Relative: 12 %
NEUTROS ABS: 13 10*3/uL — AB (ref 1.4–6.5)
NEUTROS PCT: 78 %
Platelets: 95 10*3/uL — ABNORMAL LOW (ref 150–440)
RBC: 4.01 MIL/uL — AB (ref 4.40–5.90)
RDW: 16.4 % — ABNORMAL HIGH (ref 11.5–14.5)
WBC: 16.6 10*3/uL — AB (ref 3.8–10.6)

## 2017-05-19 LAB — CK: Total CK: 202 U/L (ref 49–397)

## 2017-05-19 LAB — LACTIC ACID, PLASMA: Lactic Acid, Venous: 1.5 mmol/L (ref 0.5–1.9)

## 2017-05-19 MED ORDER — VANCOMYCIN HCL IN DEXTROSE 1-5 GM/200ML-% IV SOLN
1000.0000 mg | Freq: Once | INTRAVENOUS | Status: AC
  Administered 2017-05-19: 1000 mg via INTRAVENOUS
  Filled 2017-05-19: qty 200

## 2017-05-19 MED ORDER — ONDANSETRON HCL 4 MG/2ML IJ SOLN
4.0000 mg | Freq: Once | INTRAMUSCULAR | Status: AC
  Administered 2017-05-19: 4 mg via INTRAVENOUS
  Filled 2017-05-19: qty 2

## 2017-05-19 MED ORDER — MORPHINE SULFATE (PF) 4 MG/ML IV SOLN
4.0000 mg | Freq: Once | INTRAVENOUS | Status: AC
  Administered 2017-05-19: 4 mg via INTRAVENOUS
  Filled 2017-05-19: qty 1

## 2017-05-19 MED ORDER — CEFTRIAXONE SODIUM IN DEXTROSE 20 MG/ML IV SOLN
1.0000 g | Freq: Once | INTRAVENOUS | Status: AC
Start: 2017-05-19 — End: 2017-05-19
  Administered 2017-05-19: 1 g via INTRAVENOUS
  Filled 2017-05-19: qty 50

## 2017-05-19 NOTE — ED Notes (Signed)
Patient transported to X-ray 

## 2017-05-19 NOTE — ED Provider Notes (Signed)
[facility] Emergency Department Provider Note   ____________________________________________   None    (approximate)  I have reviewed the triage vital signs and the nursing notes.   HISTORY  Chief Complaint Arm Swelling   HPI Derrick Blanchard is a 36 y.o. male Was here on the seventh complaining of pain and swelling to the penis.  this was diagnosed as being from rough sex. Patient reports she was laying on the couch 3 days ago and slid off trying not to roll over on the swollen part of his groin and fell on his right arm. Right arm has been hurting in his been swollen ever since it is now red and swollen from the MCP joints of the mid wrist. Very tender and painfulfingers are numb he reports slightly decreased capillary refill a lot of pain on passive movement. X-rays of the wrist andforearm are negative ultrasound shows no sign of DVT  Past Medical History:  Diagnosis Date  . Cirrhosis (HCC)   . Hepatitis     Patient Active Problem List   Diagnosis Date Noted  . Alcohol dependence (HCC) 02/03/2015  . Alcohol dependence with uncomplicated withdrawal (HCC)   . Alcoholic hepatitis 01/30/2015  . Alcohol use 01/30/2015  . Melena 01/30/2015  . Coagulopathy (HCC) 01/30/2015  . Hyponatremia 01/30/2015  . Bruise, trunk 01/30/2015  . Anemia 01/30/2015    Past Surgical History:  Procedure Laterality Date  . ESOPHAGOGASTRODUODENOSCOPY (EGD) WITH PROPOFOL N/A 02/03/2015   Procedure: ESOPHAGOGASTRODUODENOSCOPY (EGD) WITH PROPOFOL;  Surgeon: Elnita Maxwell, MD;  Location: [facility] ENDOSCOPY;  Service: Endoscopy;  Laterality: N/A;    Prior to Admission medications   Medication Sig Start Date End Date Taking? Authorizing Provider  ibuprofen (MOTRIN IB) 200 MG tablet Take 3 tablets (600 mg total) by mouth every 6 (six) hours as needed. 05/12/17  Yes Sharman Cheek, MD  lactulose (CHRONULAC) 10 GM/15ML solution Take 30 mLs (20 g total) by mouth  daily. Patient not taking: Reported on 05/12/2017 02/07/15   Katha Hamming, MD  LORazepam (ATIVAN) 1 MG tablet Take 1 tablet (1 mg total) by mouth every 6 (six) hours as needed for anxiety. Patient not taking: Reported on 05/12/2017 02/07/15   Katha Hamming, MD  oxyCODONE (OXY IR/ROXICODONE) 5 MG immediate release tablet Take 1-2 tablets (5-10 mg total) by mouth every 6 (six) hours as needed for moderate pain. Patient not taking: Reported on 05/12/2017 02/07/15   Katha Hamming, MD  pantoprazole (PROTONIX) 40 MG tablet Take 1 tablet (40 mg total) by mouth daily at 12 noon. Patient not taking: Reported on 05/12/2017 02/07/15   Katha Hamming, MD  predniSONE (DELTASONE) 20 MG tablet Prednisone 40 mg po daily for 28 days 30 mg daily one week after  28 days 20 mg po daily for one week 10 mg po daily one week Patient not taking: Reported on 05/12/2017 02/07/15   Katha Hamming, MD    Allergies Patient has no known allergies.  No family history on file.  Social History Social History  Substance Use Topics  . Smoking status: Current Some Day Smoker    Types: Cigarettes  . Smokeless tobacco: Never Used  . Alcohol use Yes    Review of Systems  Constitutional: No fever/chills Eyes: No visual changes. ENT: No sore throat. Cardiovascular: Denies chest pain. Respiratory: Denies shortness of breath. Gastrointestinal: No abdominal pain.  No nausea, no vomiting.  No diarrhea.  No constipation. Genitourinary: Negative for dysuria. Musculoskeletal: Negative for back pain. Skin:  Negative for rash. Neurological: Negative for headaches, focal weakness  ____________________________________________   PHYSICAL EXAM:  VITAL SIGNS: ED Triage Vitals  Enc Vitals Group     BP 05/19/17 1928 (!) 152/121     Pulse Rate 05/19/17 1928 (!) 118     Resp 05/19/17 1928 14     Temp 05/19/17 1928 99.3 F (37.4 C)     Temp Source 05/19/17 1928 Oral     SpO2 05/19/17 1928 96 %     Weight  05/19/17 1930 200 lb (90.7 kg)     Height 05/19/17 1930  (1.702 m)     Head Circumference --      Peak Flow --      Pain Score 05/19/17 2137 8     Pain Loc --      Pain Edu? --      Excl. in GC? --     Constitutional: Alert and oriented. Well appearing and in no acute distress. Eyes: Conjunctivae are normal.  Head: Atraumatic. Nose: No congestion/rhinnorhea. Mouth/Throat: Mucous membranes are moist.  Oropharynx non-erythematous. Neck: No stridor. Cardiovascular: Normal rate, regular rhythm. Grossly normal heart sounds.  Good peripheral circulation. Respiratory: Normal respiratory effort.  No retractions. Lungs CTAB. Gastrointestinal: Soft and nontender. No distention. No abdominal bruits. No CVA tenderness. Musculoskeletal: No lower extremity tenderness nor edema. Right arm as described in history of present illness left arm is normal Neurologic:  Normal speech and language. No gross focal neurologic deficits are appreciated. Skin:  Skin is warm, dry and intact. No rash noted.right arm or forearm is read as described in history of present illness Psychiatric: Mood and affect are normal. Speech and behavior are normal.  ____________________________________________   LABS (all labs ordered are listed, but only abnormal results are displayed)  Labs Reviewed  COMPREHENSIVE METABOLIC PANEL - Abnormal; Notable for the following:       Result Value   Chloride 99 (*)    Glucose, Bld 150 (*)    Creatinine, Ser 0.31 (*)    Albumin 3.3 (*)    AST 200 (*)    ALT 64 (*)    Alkaline Phosphatase 469 (*)    Total Bilirubin 3.3 (*)    All other components within normal limits  CBC WITH DIFFERENTIAL/PLATELET - Abnormal; Notable for the following:    WBC 16.6 (*)    RBC 4.01 (*)    Hemoglobin 12.6 (*)    HCT 36.3 (*)    RDW 16.4 (*)    Platelets 95 (*)    Neutro Abs 13.0 (*)    Monocytes Absolute 2.0 (*)    All other components within normal limits  URINALYSIS, COMPLETE (UACMP)  WITH MICROSCOPIC - Abnormal; Notable for the following:    Color, Urine YELLOW (*)    APPearance CLEAR (*)    Hgb urine dipstick SMALL (*)    Protein, ur 30 (*)    All other components within normal limits  ETHANOL - Abnormal; Notable for the following:    Alcohol, Ethyl (B) 356 (*)    All other components within normal limits  LACTIC ACID, PLASMA  CK   ____________________________________________  EKG   ____________________________________________  RADIOLOGY  Dg Forearm Right  Result Date: 05/19/2017 CLINICAL DATA:  Pain. EXAM: RIGHT FOREARM - 2 VIEW COMPARISON:  05/19/2017 FINDINGS: There is no evidence of fracture or other focal bone lesions. Soft tissues are unremarkable. IMPRESSION: Negative. Electronically Signed   By: Signa Kell M.D.   On: 05/19/2017 20:19  US Venous Img Upper Uni Right  Result Date: 05/19/2017 CLINICAL DATA:  Right upper extremity edema EXAM: RIGHT UPPER EXTREMITY VENOUS DUPLEX ULTRASOUND TECHNIQUE: Gray-scale sonography with graded compression, as well as color Doppler and duplex ultrasound were performed to evaluate the right upper extremity deep venous system from the level of the subclavian vein and including the jugular, axillary, basilic, radial, ulnar and upper cephalic vein. Spectral Doppler was utilized to evaluate flow at rest and with distal augmentation maneuvers. COMPARISON:  None. FINDINGS: Contralateral Subclavian Vein: Respiratory phasicity is normal and symmetric with the symptomatic side. No evidence of thrombus. Normal compressibility. Internal Jugular Vein: No evidence of thrombus. Normal compressibility, respiratory phasicity and response to augmentation. Subclavian Vein: No evidence of thrombus. Normal compressibility, respiratory phasicity and response to augmentation. Axillary Vein: No evidence of thrombus. Normal compressibility, respiratory phasicity and response to augmentation. Cephalic Vein: No evidence of thrombus. Normal  compressibility, respiratory phasicity and response to augmentation. Basilic Vein: No evidence of thrombus. Normal compressibility, respiratory phasicity and response to augmentation. Brachial Veins: No evidence of thrombus. Normal compressibility, respiratory phasicity and response to augmentation. Radial Veins: No evidence of thrombus. Normal compressibility, respiratory phasicity and response to augmentation. Ulnar Veins: No evidence of thrombus. Normal compressibility, respiratory phasicity and response to augmentation. Venous Reflux:  None visualized. Other Findings:  None visualized. IMPRESSION: No evidence of deep venous thrombosis in the right upper extremity. Left subclavian vein also patent. Electronically Signed   By: Bretta Bang III M.D.   On: 05/19/2017 21:31   Dg Hand Complete Right  Result Date: 05/19/2017 CLINICAL DATA:  Right wrist injury.  Hand pain. EXAM: RIGHT HAND - COMPLETE 3+ VIEW COMPARISON:  None. FINDINGS: There is no evidence of fracture or dislocation. There is no evidence of arthropathy or other focal bone abnormality. There is marked soft tissue swelling. IMPRESSION: Marked soft tissue swelling of the right hand without acute osseous abnormality. Electronically Signed   By: Deatra Robinson M.D.   On: 05/19/2017 20:17    ____________________________________________   PROCEDURES  Procedure(s) performed:   Procedures  Critical Care performed:   ____________________________________________   INITIAL IMPRESSION / ASSESSMENT AND PLAN / ED COURSE  Pertinent labs & imaging results that were available during my care of the patient were reviewed by me and considered in my medical decision making (see chart for details).  Patient may have flexor tenosynovitis or compartment syndrome or just a severe cellulitis I'm not sure at this point but after discussing with her orthopedic surgeon on-call he recommends following up with hand surgery patient himself asked to go to  [facility].      ____________________________________________   FINAL CLINICAL IMPRESSION(S) / ED DIAGNOSES  Final diagnoses:  Swelling  Alcoholic intoxication with complication (HCC)  Right arm pain      NEW MEDICATIONS STARTED DURING THIS VISIT:  New Prescriptions   No medications on file     Note:  This document was prepared using Dragon voice recognition software and may include unintentional dictation errors.    Arnaldo Natal, MD 05/20/17 [phone]

## 2017-05-19 NOTE — ED Notes (Signed)
Ethanol level 356

## 2017-05-19 NOTE — ED Notes (Signed)
Pt informed that the ED MD wants to transfer the patient to [facility] for further treatment; pt asks if his girlfriend will be able to go with him to [facility]; pt informed that ambulance that will transport the patient, will not transport his girlfriend; pt states "if she can't go, then I ain't going." ED MD made aware of this conversation; after speaking with the MD, pt states, "I need to make a couple of phone calls, and I'll let y'all know." Pt provided the courtesy phone to make the calls. After making the phone calls, pt states, "I got somebody to come get her, so I'll go." ED MD made aware of pt's decision to be transferred to [facility].

## 2017-05-19 NOTE — ED Notes (Signed)
Pt going to Ultrasound. 

## 2017-05-19 NOTE — ED Triage Notes (Signed)
Pt presents via POC c/o right arm swelling x3 days. Reports falling off of couch and injuring wrist. Pt reports left groin pain, seen here for penile and testicle swelling in relation to pain, Urology consulted per pt and d/c home. + ETOH per pt report.

## 2017-05-19 NOTE — ED Notes (Signed)
Pt sitting in lobby chair with eyes closed.

## 2017-05-20 NOTE — ED Notes (Signed)
I have reviewed the EMTALA and it is complete.  

## 2017-05-20 NOTE — ED Provider Notes (Signed)
-----------------------------------------   12:51 AM on 05/20/2017 -----------------------------------------  I reassessed the patient while EMS was present and getting ready to take him to [facility].  I do not see the patient prior to this examination, but the exam is consistent with what I was told that what is documented in the history of present illness and the note from Dr. Darnelle Catalan.  The patient has edema and erythema throughout the forearm and hand on the right side with tense but still compressible compartments.  However he continues to endorse decreased sensation in the middle, ring, and little fingers although he does still retained light touch sensation.  He is holding all of his fingers in slight flexion and has pain with both flexion and extension.  I feel his exam is consistent with a deep infection, likely flexor tenosynovitis or a similar infectious process, fortunately his exam has not progressed or worsened since Dr. Darnelle Catalan evaluation.  Even though he is at risk for compartment syndrome it does not seem to be present at this time and is no worse now than it was upon his initial arrival.  He will be transferred to [facility] for further evaluation and management by the hand specialist.  Update EMTALA documentation.   Loleta Rose, MD 05/20/17 [phone]

## 2017-05-20 NOTE — ED Notes (Signed)
Pt transferred to [facility] ED via [facility]; discharge paperwork sent with EMS; report called to [facility] ED Charge RN.

## 2017-05-20 NOTE — ED Notes (Signed)
Unable to obtain E-Signature for the Transfer Consent; E-Sign pad in the room is not working; two paper copy printed and signed by patient giving consent for transfer to [facility]; this RN signed as witness; one copy sent with patient record to [facility], one copy attached to patient chart.

## 2017-09-23 ENCOUNTER — Emergency Department: Payer: Medicaid Other

## 2017-09-23 ENCOUNTER — Inpatient Hospital Stay
Admission: EM | Admit: 2017-09-23 | Discharge: 2017-09-26 | DRG: 392 | Disposition: A | Discharge status: Home / Self Care | Payer: Medicaid Other | Attending: Internal Medicine | Admitting: Internal Medicine

## 2017-09-23 ENCOUNTER — Other Ambulatory Visit: Payer: Self-pay

## 2017-09-23 ENCOUNTER — Encounter: Payer: Self-pay | Admitting: Emergency Medicine

## 2017-09-23 DIAGNOSIS — F10239 Alcohol dependence with withdrawal, unspecified: Secondary | ICD-10-CM

## 2017-09-23 DIAGNOSIS — F10939 Alcohol use, unspecified with withdrawal, unspecified: Secondary | ICD-10-CM

## 2017-09-23 DIAGNOSIS — K7030 Alcoholic cirrhosis of liver without ascites: Secondary | ICD-10-CM

## 2017-09-23 DIAGNOSIS — F10229 Alcohol dependence with intoxication, unspecified: Secondary | ICD-10-CM | POA: Diagnosis present

## 2017-09-23 DIAGNOSIS — K2920 Alcoholic gastritis without bleeding: Principal | ICD-10-CM | POA: Diagnosis present

## 2017-09-23 DIAGNOSIS — I8500 Esophageal varices without bleeding: Secondary | ICD-10-CM

## 2017-09-23 DIAGNOSIS — I248 Other forms of acute ischemic heart disease: Secondary | ICD-10-CM | POA: Diagnosis present

## 2017-09-23 DIAGNOSIS — F1410 Cocaine abuse, uncomplicated: Secondary | ICD-10-CM | POA: Diagnosis present

## 2017-09-23 DIAGNOSIS — K766 Portal hypertension: Secondary | ICD-10-CM | POA: Diagnosis present

## 2017-09-23 DIAGNOSIS — F17210 Nicotine dependence, cigarettes, uncomplicated: Secondary | ICD-10-CM | POA: Diagnosis present

## 2017-09-23 DIAGNOSIS — K7010 Alcoholic hepatitis without ascites: Secondary | ICD-10-CM | POA: Diagnosis present

## 2017-09-23 DIAGNOSIS — E876 Hypokalemia: Secondary | ICD-10-CM

## 2017-09-23 DIAGNOSIS — I8510 Secondary esophageal varices without bleeding: Secondary | ICD-10-CM | POA: Diagnosis present

## 2017-09-23 LAB — BASIC METABOLIC PANEL
Anion gap: 14 (ref 5–15)
BUN: 6 mg/dL (ref 6–20)
CHLORIDE: 96 mmol/L — AB (ref 101–111)
CO2: 25 mmol/L (ref 22–32)
CREATININE: 0.58 mg/dL — AB (ref 0.61–1.24)
Calcium: 8.6 mg/dL — ABNORMAL LOW (ref 8.9–10.3)
GFR calc Af Amer: >60 mL/min (ref >60)
GFR calc non Af Amer: >60 mL/min (ref >60)
GLUCOSE: 133 mg/dL — AB (ref 65–99)
Potassium: 2.7 mmol/L — CL (ref 3.5–5.1)
Sodium: 135 mmol/L (ref 135–145)

## 2017-09-23 LAB — CBC
HCT: 41 % (ref 40.0–52.0)
Hemoglobin: 14.1 g/dL (ref 13.0–18.0)
MCH: 30.6 pg (ref 26.0–34.0)
MCHC: 34.3 g/dL (ref 32.0–36.0)
MCV: 89.2 fL (ref 80.0–100.0)
PLATELETS: 48 10*3/uL — AB (ref 150–440)
RBC: 4.6 MIL/uL (ref 4.40–5.90)
RDW: 19.1 % — ABNORMAL HIGH (ref 11.5–14.5)
WBC: 7.4 10*3/uL (ref 3.8–10.6)

## 2017-09-23 LAB — HEPATIC FUNCTION PANEL
ALK PHOS: 366 U/L — AB (ref 38–126)
ALT: 55 U/L (ref 17–63)
AST: 281 U/L — ABNORMAL HIGH (ref 15–41)
Albumin: 3.9 g/dL (ref 3.5–5.0)
BILIRUBIN INDIRECT: 2.9 mg/dL — AB (ref 0.3–0.9)
BILIRUBIN TOTAL: 5.8 mg/dL — AB (ref 0.3–1.2)
Bilirubin, Direct: 2.9 mg/dL — ABNORMAL HIGH (ref 0.1–0.5)
TOTAL PROTEIN: 8.7 g/dL — AB (ref 6.5–8.1)

## 2017-09-23 LAB — PROTIME-INR
INR: 1.25
PROTHROMBIN TIME: 15.6 s — AB (ref 11.4–15.2)

## 2017-09-23 LAB — LIPASE, BLOOD: Lipase: 42 U/L (ref 11–51)

## 2017-09-23 LAB — TROPONIN I: TROPONIN I: 0.03 ng/mL — AB (ref <0.03)

## 2017-09-23 LAB — MAGNESIUM: Magnesium: 1.3 mg/dL — ABNORMAL LOW (ref 1.7–2.4)

## 2017-09-23 LAB — ETHANOL: Alcohol, Ethyl (B): 495 mg/dL (ref <10)

## 2017-09-23 LAB — AMMONIA: Ammonia: 69 umol/L — ABNORMAL HIGH (ref 9–35)

## 2017-09-23 MED ORDER — SODIUM CHLORIDE 0.9 % IV BOLUS (SEPSIS)
1000.0000 mL | Freq: Once | INTRAVENOUS | Status: AC
  Administered 2017-09-23: 1000 mL via INTRAVENOUS

## 2017-09-23 MED ORDER — LORAZEPAM 2 MG/ML IJ SOLN
2.0000 mg | Freq: Once | INTRAMUSCULAR | Status: AC
  Administered 2017-09-23: 2 mg via INTRAVENOUS

## 2017-09-23 MED ORDER — LORAZEPAM 2 MG/ML IJ SOLN
INTRAMUSCULAR | Status: AC
  Administered 2017-09-23: 2 mg via INTRAVENOUS
  Filled 2017-09-23: qty 1

## 2017-09-23 MED ORDER — POTASSIUM CHLORIDE 10 MEQ/100ML IV SOLN
10.0000 meq | Freq: Once | INTRAVENOUS | Status: AC
  Administered 2017-09-24: 10 meq via INTRAVENOUS
  Filled 2017-09-23: qty 100

## 2017-09-23 MED ORDER — POTASSIUM CHLORIDE 10 MEQ/100ML IV SOLN
10.0000 meq | Freq: Once | INTRAVENOUS | Status: AC
  Administered 2017-09-23: 10 meq via INTRAVENOUS
  Filled 2017-09-23: qty 100

## 2017-09-23 MED ORDER — IOPAMIDOL (ISOVUE-300) INJECTION 61%
100.0000 mL | Freq: Once | INTRAVENOUS | Status: AC | PRN
  Administered 2017-09-23: 100 mL via INTRAVENOUS

## 2017-09-23 MED ORDER — ONDANSETRON HCL 4 MG/2ML IJ SOLN
4.0000 mg | Freq: Once | INTRAMUSCULAR | Status: AC
  Administered 2017-09-23: 4 mg via INTRAVENOUS
  Filled 2017-09-23: qty 2

## 2017-09-23 MED ORDER — MAGNESIUM SULFATE 2 GM/50ML IV SOLN
2.0000 g | Freq: Once | INTRAVENOUS | Status: AC
  Administered 2017-09-23: 2 g via INTRAVENOUS
  Filled 2017-09-23: qty 50

## 2017-09-23 MED ORDER — THIAMINE HCL 100 MG/ML IJ SOLN
Freq: Once | INTRAVENOUS | Status: AC
  Administered 2017-09-24: 01:00:00 via INTRAVENOUS
  Filled 2017-09-23: qty 1000

## 2017-09-23 NOTE — ED Notes (Signed)
Patient transported to X-ray 

## 2017-09-23 NOTE — ED Notes (Signed)
ED Provider at bedside. 

## 2017-09-23 NOTE — ED Provider Notes (Signed)
[facility] Emergency Department Provider Note   ____________________________________________   First MD Initiated Contact with Patient 09/23/17 2208     (approximate)  I have reviewed the triage vital signs and the nursing notes.   HISTORY  Chief Complaint Chest Pain   HPI Derrick Blanchard is a 37 y.o. male presents for evaluation reporting severe abdominal pain with nausea and vomiting throwing up pretty much everything that he tries to eat for the last 2 days.  Reports that he is a heavy drinker, he has cirrhosis, he had some brief sobriety but now drinks about a pint a day.  He is able to hold down some alcohol but nothing else.  Reports severe pain in the upper abdomen.  Nausea and vomiting.  No fevers or chills.  No chest pain, but reports a burning sensation in the upper epigastrium.  No shortness of breath.  He did fall a couple days ago on landed on the edge of the floor but did not strike his head or sustain a head injury.  No neck pain.  Reports he feels a little bruised and sore over his right upper quadrant in addition to pain in his mid upper epigastrium.  Patient also reports he started to feel tremulous and shaky as though he is starting to get withdrawals  Past Medical History:  Diagnosis Date  . Cirrhosis (HCC)   . Hepatitis     Patient Active Problem List   Diagnosis Date Noted  . Alcohol dependence (HCC) 02/03/2015  . Alcohol dependence with uncomplicated withdrawal (HCC)   . Alcoholic hepatitis 01/30/2015  . Alcohol use 01/30/2015  . Melena 01/30/2015  . Coagulopathy (HCC) 01/30/2015  . Hyponatremia 01/30/2015  . Bruise, trunk 01/30/2015  . Anemia 01/30/2015    Past Surgical History:  Procedure Laterality Date  . ESOPHAGOGASTRODUODENOSCOPY (EGD) WITH PROPOFOL N/A 02/03/2015   Procedure: ESOPHAGOGASTRODUODENOSCOPY (EGD) WITH PROPOFOL;  Surgeon: Elnita Maxwell, MD;  Location: [facility] ENDOSCOPY;  Service: Endoscopy;   Laterality: N/A;    Prior to Admission medications   Not on File    Allergies Patient has no known allergies.  No family history on file.  Social History Social History   Tobacco Use  . Smoking status: Current Some Day Smoker    Types: Cigarettes  . Smokeless tobacco: Never Used  Substance Use Topics  . Alcohol use: Yes  . Drug use: Yes    Types: Marijuana, Cocaine    Review of Systems Constitutional: No fever/chills Eyes: No visual changes. ENT: No sore throat.  Very dry in the throat Cardiovascular: Denies chest pain for a slight tightness and burning sensation. Respiratory: Denies shortness of breath. Gastrointestinal: See HPI, reports severe nausea vomiting and abdominal pain primarily over the right side no diarrhea.  No constipation. Genitourinary: Negative for dysuria. Musculoskeletal: Negative for back pain. Skin: Negative for rash. Neurological: Negative for headaches, focal weakness or numbness.    ____________________________________________   PHYSICAL EXAM:  VITAL SIGNS: ED Triage Vitals  Enc Vitals Group     BP 09/23/17 2133 (!) 130/100     Pulse Rate 09/23/17 2133 (!) 106     Resp 09/23/17 2133 18     Temp 09/23/17 2133 98.4 F (36.9 C)     Temp Source 09/23/17 2133 Oral     SpO2 09/23/17 2133 97 %     Weight 09/23/17 2130 200 lb (90.7 kg)     Height 09/23/17 2130 5\' 8"  (1.727 m)  Head Circumference --      Peak Flow --      Pain Score 09/23/17 2125 8     Pain Loc --      Pain Edu? --      Excl. in GC? --     Constitutional: Alert and oriented.  Tightly tremulous, appears anxious. Eyes: Conjunctivae are normal.  A slight dilation of the pupils bilateral Head: Atraumatic. Nose: No congestion/rhinnorhea. Mouth/Throat: Mucous membranes are moist. Neck: No stridor.   Cardiovascular: Normal rate, regular rhythm. Grossly normal heart sounds.  Good peripheral circulation. Respiratory: Normal respiratory effort.  No retractions. Lungs  CTAB. Gastrointestinal: Moderate tenderness throughout, severe tenderness in the right upper quadrant.  Some slight guarding in the right upper abdomen Musculoskeletal: No lower extremity tenderness nor edema.  His lower extremities well without pain or discomfort.  Some small areas of ecchymosis over the right upper thigh and right upper arm without deformity, full range of motion without pain.  Does have a mild tremulousness noted in both upper extremities. Neurologic:  Normal speech and language. No gross focal neurologic deficits are appreciated.  Mild hyperreflexia of the lower extremities bilateral skin:  Skin is warm, dry and intact. No rash noted. Psychiatric: Mood and affect are slightly anxious. Speech and behavior are normal.  Denies hallucinations  ____________________________________________   LABS (all labs ordered are listed, but only abnormal results are displayed)  Labs Reviewed  BASIC METABOLIC PANEL - Abnormal; Notable for the following components:      Result Value   Potassium 2.7 (*)    Chloride 96 (*)    Glucose, Bld 133 (*)    Creatinine, Ser 0.58 (*)    Calcium 8.6 (*)    All other components within normal limits  CBC - Abnormal; Notable for the following components:   RDW 19.1 (*)    Platelets 48 (*)    All other components within normal limits  TROPONIN I - Abnormal; Notable for the following components:   Troponin I 0.03 (*)    All other components within normal limits  AMMONIA - Abnormal; Notable for the following components:   Ammonia 69 (*)    All other components within normal limits  ETHANOL - Abnormal; Notable for the following components:   Alcohol, Ethyl (B) 495 (*)    All other components within normal limits  HEPATIC FUNCTION PANEL - Abnormal; Notable for the following components:   Total Protein 8.7 (*)    AST 281 (*)    Alkaline Phosphatase 366 (*)    Total Bilirubin 5.8 (*)    Bilirubin, Direct 2.9 (*)    Indirect Bilirubin 2.9 (*)     All other components within normal limits  PROTIME-INR - Abnormal; Notable for the following components:   Prothrombin Time 15.6 (*)    All other components within normal limits  MAGNESIUM - Abnormal; Notable for the following components:   Magnesium 1.3 (*)    All other components within normal limits  LIPASE, BLOOD  URINE DRUG SCREEN, QUALITATIVE ([facility] ONLY)  URINALYSIS, COMPLETE ([facility]) WITH MICROSCOPIC   ____________________________________________  EKG  Reviewed and entered by me at 2130 Heart rate 110 QRS 100 QTC 470 Sinus tachycardia, nonspecific T wave abnormality, PVC.  No evidence of acute ischemia is noted ____________________________________________  RADIOLOGY  Dg Chest 2 View  Result Date: 09/23/2017 CLINICAL DATA:  Chest pain and nausea EXAM: CHEST  2 VIEW COMPARISON:  Jan 30, 2015 FINDINGS: The heart size and mediastinal contours are  within normal limits. Both lungs are clear. The visualized skeletal structures are unremarkable. IMPRESSION: No active cardiopulmonary disease. Electronically Signed   By: Gerome Samavid  Williams III M.D   On: 09/23/2017 22:19   Ct Abdomen Pelvis W Contrast  Result Date: 09/23/2017 CLINICAL DATA:  Chest pain and nausea. History of cirrhosis. Recent cocaine use. EXAM: CT ABDOMEN AND PELVIS WITH CONTRAST TECHNIQUE: Multidetector CT imaging of the abdomen and pelvis was performed using the standard protocol following bolus administration of intravenous contrast. CONTRAST:  100mL ISOVUE-300 IOPAMIDOL (ISOVUE-300) INJECTION 61% COMPARISON:  Jan 30, 2015 FINDINGS: Lower chest: Respiratory motion limits evaluation of the lung bases. 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 Lung bases are otherwise unremarkable. Hepatobiliary: The liver demonstrates a heterogeneous attenuation with multiple rounded and confluent regions of low attenuation. This appearance is nonspecific but very similar in appearance to the Jan 30, 2015 study. The portal vein remains patent.  The left portal vein is dilated compared to the other branches and there is recannulization of the umbilical vein with collaterals in the anterior abdominal wall. The patient's varices are more dilated compared to the previous study. There are paraesophageal varices identified. There is cholelithiasis. Pancreas: Unremarkable. No pancreatic ductal dilatation or surrounding inflammatory changes. Spleen: The spleen measures 15.4 cm in cranial caudal dimension which is similar to the previous study. Adrenals/Urinary Tract: Adrenal glands are normal. No renal stones or masses. The bladder is distended. No ureteral stones. Stomach/Bowel: The stomach and small bowel are normal. A few scattered colonic diverticuli are seen without diverticulitis. The visualized appendix is normal. Vascular/Lymphatic: The abdominal aorta is nonaneurysmal. No adenopathy. Reproductive: Prostate is unremarkable. Other: No abdominal wall hernia or abnormality. No abdominopelvic ascites. Musculoskeletal: No acute or significant osseous findings. IMPRESSION: 1. The liver is similar in appearance to the Jan 30, 2015 study. The heterogeneous attenuation with multiple low-attenuation nodular and confluent regions is probably due to the patient's reported cirrhosis with regenerating nodules. The lack of significant change in greater than 2 years would argue against a malignant process. That being said, based on imaging alone, underlying neoplasm/malignancy is not excluded. MRI could better assess. 2. 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 3. Cholelithiasis. 4. Splenomegaly. 5. No other acute abnormalities. Electronically Signed   By: Gerome Samavid  Williams III M.D   On: 09/23/2017 23:17     See the CT scan report, reviewed by me, heterogenous liver is quite notable.  Varices are notable with portal venous hypertension ____________________________________________   PROCEDURES  Procedure(s)  performed: None  Procedures  Critical Care performed: No  ____________________________________________   INITIAL IMPRESSION / ASSESSMENT AND PLAN / ED COURSE  Pertinent labs & imaging results that were available during my care of the patient were reviewed by me and considered in my medical decision making (see chart for details).  Differential diagnosis includes but is not limited to, abdominal perforation, aortic dissection, cholecystitis, appendicitis, diverticulitis, colitis, esophagitis/gastritis, kidney stone, pyelonephritis, urinary tract infection, aortic aneurysm. All are considered in decision and treatment plan. Based upon the patient's presentation and risk factors, I am concerned about complication related to his severe alcoholism.  His troponin is minimally elevated, but his symptomatology seems to suggest primarily abdominal etiology for his discomfort, particularly a burning in his chest after multiple rounds of emesis and severe pain.  He has thrombocytopenia, some slight bruising of the right upper arm and right lower leg, he appears to have stigmata of liver disease, and appears unwell.  ----------------------------------------- 11:45 PM on 09/23/2017 -----------------------------------------  Plan to admit the patient for further evaluation, is fairly severe hypokalemia, hypomagnesemia, transaminitis, elevated bilirubin, all seem to be suggestive of ongoing or worsening of his liver disease.  Concerned that the patient is at risk for developing worsening picture of possible alcoholic hepatitis given his associated right upper quadrant pain, inability to keep by mouth, elevating bilirubin previous history.  I also suspect the patient will likely enter severe alcohol withdrawal, he does report his symptoms are better and he appears less tremulous is blood pressure and heart rate have improved after Ativan.  Markedly noted there with a blood alcohol greater than 400 the  patient is alert talkative and not confused.  No evidence of acute alcohol intoxication clinically are noted with his blood alcohol greater than 400.  In fact, if anything he seems to have had some slight tremulousness tachycardia possibly indicative of early withdrawal despite having a blood alcohol is high.      ____________________________________________   FINAL CLINICAL IMPRESSION(S) / ED DIAGNOSES  Final diagnoses:  Alcoholic cirrhosis of liver without ascites (HCC)  Esophageal varices without bleeding, unspecified esophageal varices type (HCC)  Hypokalemia  Hypomagnesemia  Alcohol withdrawal syndrome with complication (HCC)      NEW MEDICATIONS STARTED DURING THIS VISIT:  New Prescriptions   No medications on file     Note:  This document was prepared using Dragon voice recognition software and may include unintentional dictation errors.     Sharyn Creamer, MD 09/23/17 [phone]

## 2017-09-23 NOTE — ED Notes (Signed)
Patient transported to CT 

## 2017-09-23 NOTE — ED Notes (Signed)
Med arrived from pharm

## 2017-09-23 NOTE — ED Triage Notes (Signed)
Pt arrives via [facility] with c/o chest pain and nausea. Pt reports cirrhosis of the liver and reports the last time he used cocaine was x 2 days ago. Pt is shaky in triage and admits to drinking "a little over a pint" of vodka today. Pt is in NAD.

## 2017-09-23 NOTE — ED Notes (Signed)
Pharm called for med 

## 2017-09-23 NOTE — ED Notes (Signed)
EMS pt from home with c/o abd pain after drinking alcohol; history of cirrhosis; vomited after eating an orange; EMS reports no vomiting with them; adds pt admits to cocaine use in the last few days

## 2017-09-23 NOTE — ED Notes (Signed)
CRITICAL LAB: TROPONIN is 0.03,  ETOH is 495, Potassium is 2.7, [facility], Dr. Fanny BienQuale notified, orders received

## 2017-09-23 NOTE — ED Notes (Signed)
Pt reports drank pint of vodka and smoked "reefer" today, vomiting and chils for the last 2 days, reflects pain on palpation to center chest and right upper quad "because I got cirrhosis"   Pt appears without acute distress and inebriated

## 2017-09-24 DIAGNOSIS — I8510 Secondary esophageal varices without bleeding: Secondary | ICD-10-CM | POA: Diagnosis present

## 2017-09-24 DIAGNOSIS — F10239 Alcohol dependence with withdrawal, unspecified: Secondary | ICD-10-CM | POA: Diagnosis present

## 2017-09-24 DIAGNOSIS — K7010 Alcoholic hepatitis without ascites: Secondary | ICD-10-CM | POA: Diagnosis present

## 2017-09-24 DIAGNOSIS — K7030 Alcoholic cirrhosis of liver without ascites: Secondary | ICD-10-CM | POA: Diagnosis present

## 2017-09-24 DIAGNOSIS — K766 Portal hypertension: Secondary | ICD-10-CM | POA: Diagnosis present

## 2017-09-24 DIAGNOSIS — F1410 Cocaine abuse, uncomplicated: Secondary | ICD-10-CM | POA: Diagnosis present

## 2017-09-24 DIAGNOSIS — F10229 Alcohol dependence with intoxication, unspecified: Secondary | ICD-10-CM | POA: Diagnosis present

## 2017-09-24 DIAGNOSIS — F17210 Nicotine dependence, cigarettes, uncomplicated: Secondary | ICD-10-CM | POA: Diagnosis present

## 2017-09-24 DIAGNOSIS — E876 Hypokalemia: Secondary | ICD-10-CM | POA: Diagnosis present

## 2017-09-24 DIAGNOSIS — K2920 Alcoholic gastritis without bleeding: Secondary | ICD-10-CM | POA: Diagnosis not present

## 2017-09-24 DIAGNOSIS — I248 Other forms of acute ischemic heart disease: Secondary | ICD-10-CM | POA: Diagnosis present

## 2017-09-24 LAB — URINE DRUG SCREEN, QUALITATIVE (ARMC ONLY)
AMPHETAMINES, UR SCREEN: NOT DETECTED
BENZODIAZEPINE, UR SCRN: NOT DETECTED
Barbiturates, Ur Screen: NOT DETECTED
Cannabinoid 50 Ng, Ur ~~LOC~~: POSITIVE — AB
Cocaine Metabolite,Ur ~~LOC~~: NOT DETECTED
MDMA (ECSTASY) UR SCREEN: NOT DETECTED
METHADONE SCREEN, URINE: NOT DETECTED
OPIATE, UR SCREEN: NOT DETECTED
Phencyclidine (PCP) Ur S: NOT DETECTED
Tricyclic, Ur Screen: NOT DETECTED

## 2017-09-24 LAB — TROPONIN I
TROPONIN I: 0.03 ng/mL — AB (ref <0.03)
Troponin I: <0.03 ng/mL (ref <0.03)
Troponin I: <0.03 ng/mL (ref <0.03)

## 2017-09-24 LAB — URINALYSIS, COMPLETE (UACMP) WITH MICROSCOPIC
Bacteria, UA: NONE SEEN
Bilirubin Urine: NEGATIVE
GLUCOSE, UA: NEGATIVE mg/dL
KETONES UR: NEGATIVE mg/dL
LEUKOCYTES UA: NEGATIVE
Nitrite: NEGATIVE
PROTEIN: 100 mg/dL — AB
SQUAMOUS EPITHELIAL / LPF: NONE SEEN
Specific Gravity, Urine: 1.012 (ref 1.005–1.030)
pH: 7 (ref 5.0–8.0)

## 2017-09-24 LAB — CBC
HCT: 36.3 % — ABNORMAL LOW (ref 40.0–52.0)
HEMOGLOBIN: 12.2 g/dL — AB (ref 13.0–18.0)
MCH: 30.3 pg (ref 26.0–34.0)
MCHC: 33.5 g/dL (ref 32.0–36.0)
MCV: 90.3 fL (ref 80.0–100.0)
Platelets: 27 10*3/uL — CL (ref 150–440)
RBC: 4.02 MIL/uL — ABNORMAL LOW (ref 4.40–5.90)
RDW: 19.4 % — ABNORMAL HIGH (ref 11.5–14.5)
WBC: 3.6 10*3/uL — ABNORMAL LOW (ref 3.8–10.6)

## 2017-09-24 LAB — BASIC METABOLIC PANEL
ANION GAP: 12 (ref 5–15)
BUN: 6 mg/dL (ref 6–20)
CHLORIDE: 101 mmol/L (ref 101–111)
CO2: 23 mmol/L (ref 22–32)
Calcium: 7.6 mg/dL — ABNORMAL LOW (ref 8.9–10.3)
Creatinine, Ser: 0.59 mg/dL — ABNORMAL LOW (ref 0.61–1.24)
GFR calc non Af Amer: >60 mL/min (ref >60)
GLUCOSE: 140 mg/dL — AB (ref 65–99)
Potassium: 3 mmol/L — ABNORMAL LOW (ref 3.5–5.1)
Sodium: 136 mmol/L (ref 135–145)

## 2017-09-24 LAB — GLUCOSE, CAPILLARY: Glucose-Capillary: 107 mg/dL — ABNORMAL HIGH (ref 65–99)

## 2017-09-24 MED ORDER — INFLUENZA VAC SPLIT QUAD 0.5 ML IM SUSY: 0.5000 mL | PREFILLED_SYRINGE | INTRAMUSCULAR | Status: DC

## 2017-09-24 MED ORDER — POTASSIUM CHLORIDE 20 MEQ PO PACK
20.0000 meq | PACK | Freq: Every day | ORAL | Status: DC
  Administered 2017-09-25: 20 meq via ORAL
  Filled 2017-09-24 (×2): qty 1

## 2017-09-24 MED ORDER — LORAZEPAM 2 MG/ML IJ SOLN
1.0000 mg | Freq: Four times a day (QID) | INTRAMUSCULAR | Status: DC | PRN
  Administered 2017-09-24 (×4): 1 mg via INTRAVENOUS
  Filled 2017-09-24 (×4): qty 1

## 2017-09-24 MED ORDER — THIAMINE HCL 100 MG/ML IJ SOLN: 100.0000 mg | Freq: Every day | INTRAMUSCULAR | Status: DC

## 2017-09-24 MED ORDER — FOLIC ACID 1 MG PO TABS
1.0000 mg | ORAL_TABLET | Freq: Every day | ORAL | Status: DC
  Administered 2017-09-24 – 2017-09-25 (×2): 1 mg via ORAL
  Filled 2017-09-24 (×3): qty 1

## 2017-09-24 MED ORDER — ONDANSETRON HCL 4 MG PO TABS: 4.0000 mg | ORAL_TABLET | Freq: Four times a day (QID) | ORAL | Status: DC | PRN

## 2017-09-24 MED ORDER — LORAZEPAM 1 MG PO TABS
1.0000 mg | ORAL_TABLET | Freq: Four times a day (QID) | ORAL | Status: DC | PRN
  Administered 2017-09-25 (×2): 1 mg via ORAL
  Filled 2017-09-24 (×2): qty 1

## 2017-09-24 MED ORDER — SODIUM CHLORIDE 0.9 % IV SOLN
Freq: Once | INTRAVENOUS | Status: AC
  Administered 2017-09-24: 75 mL/h via INTRAVENOUS

## 2017-09-24 MED ORDER — PNEUMOCOCCAL VAC POLYVALENT 25 MCG/0.5ML IJ INJ: 0.5000 mL | INJECTION | INTRAMUSCULAR | Status: DC

## 2017-09-24 MED ORDER — ACETAMINOPHEN 650 MG RE SUPP: 650.0000 mg | Freq: Four times a day (QID) | RECTAL | Status: DC | PRN

## 2017-09-24 MED ORDER — ADULT MULTIVITAMIN W/MINERALS CH
1.0000 | ORAL_TABLET | Freq: Every day | ORAL | Status: DC
  Administered 2017-09-24 – 2017-09-25 (×2): 1 via ORAL
  Filled 2017-09-24 (×3): qty 1

## 2017-09-24 MED ORDER — SODIUM CHLORIDE 0.9 % IV SOLN: 40.0000 mg | INTRAVENOUS | Status: DC

## 2017-09-24 MED ORDER — VITAMIN B-1 100 MG PO TABS
100.0000 mg | ORAL_TABLET | Freq: Every day | ORAL | Status: DC
  Administered 2017-09-24 – 2017-09-25 (×2): 100 mg via ORAL
  Filled 2017-09-24 (×3): qty 1

## 2017-09-24 MED ORDER — DOCUSATE SODIUM 100 MG PO CAPS
100.0000 mg | ORAL_CAPSULE | Freq: Two times a day (BID) | ORAL | Status: DC
  Administered 2017-09-24 – 2017-09-25 (×5): 100 mg via ORAL
  Filled 2017-09-24 (×6): qty 1

## 2017-09-24 MED ORDER — POTASSIUM CHLORIDE 20 MEQ PO PACK
40.0000 meq | PACK | Freq: Once | ORAL | Status: AC
  Administered 2017-09-24: 40 meq via ORAL
  Filled 2017-09-24: qty 2

## 2017-09-24 MED ORDER — ACETAMINOPHEN 325 MG PO TABS: 650.0000 mg | ORAL_TABLET | Freq: Four times a day (QID) | ORAL | Status: DC | PRN

## 2017-09-24 MED ORDER — HYDROCODONE-ACETAMINOPHEN 5-325 MG PO TABS: 1.0000 | ORAL_TABLET | ORAL | Status: DC | PRN

## 2017-09-24 MED ORDER — PANTOPRAZOLE SODIUM 40 MG PO TBEC
40.0000 mg | DELAYED_RELEASE_TABLET | Freq: Every day | ORAL | Status: DC
  Administered 2017-09-24 – 2017-09-25 (×2): 40 mg via ORAL
  Filled 2017-09-24 (×3): qty 1

## 2017-09-24 MED ORDER — ONDANSETRON HCL 4 MG/2ML IJ SOLN
4.0000 mg | Freq: Four times a day (QID) | INTRAMUSCULAR | Status: DC | PRN
  Administered 2017-09-24: 4 mg via INTRAVENOUS
  Filled 2017-09-24: qty 2

## 2017-09-24 MED ORDER — PANTOPRAZOLE SODIUM 40 MG IV SOLR
40.0000 mg | INTRAVENOUS | Status: DC
  Administered 2017-09-24: 40 mg via INTRAVENOUS
  Filled 2017-09-24: qty 40

## 2017-09-24 MED ORDER — BISACODYL 5 MG PO TBEC: 5.0000 mg | DELAYED_RELEASE_TABLET | Freq: Every day | ORAL | Status: DC | PRN

## 2017-09-24 NOTE — ED Notes (Signed)
Per pharm delay banana bag for admin of potassium

## 2017-09-24 NOTE — Progress Notes (Signed)
CRITICAL VALUE ALERT  Critical Value:  Plt : 28 Date & Time Notied:  09/24/17   0910   Provider Notified: Dr. Katheren ShamsSalary  Orders Received/Actions taken: Evaluating anticoagulants

## 2017-09-24 NOTE — ED Notes (Signed)
Troponin collected, Anessa, RN notified of pt arrival

## 2017-09-24 NOTE — Progress Notes (Signed)
Patient a&o, vss. CIWA, prn meds given with relief. Patient placed on low bed. No complaints at this time. Continue to monitor.

## 2017-09-24 NOTE — Plan of Care (Signed)
  Progressing Education: Knowledge of General Education information will improve 09/24/2017 1648 - Progressing by Greer Eeampbell, Hope Holst C, RN Health Behavior/Discharge Planning: Ability to manage health-related needs will improve 09/24/2017 1648 - Progressing by Greer Eeampbell, Montrail Mehrer C, RN Clinical Measurements: Ability to maintain clinical measurements within normal limits will improve 09/24/2017 1648 - Progressing by Greer Eeampbell, Deanta Mincey C, RN Will remain free from infection 09/24/2017 1648 - Progressing by Greer Eeampbell, Enrrique Mierzwa C, RN Diagnostic test results will improve 09/24/2017 1648 - Progressing by Greer Eeampbell, Lennyx Verdell C, RN Respiratory complications will improve 09/24/2017 1648 - Progressing by Greer Eeampbell, Sarinity Dicicco C, RN Cardiovascular complication will be avoided 09/24/2017 1648 - Progressing by Greer Eeampbell, Rashee Marschall C, RN Activity: Risk for activity intolerance will decrease 09/24/2017 1648 - Progressing by Greer Eeampbell, Venson Ferencz C, RN Nutrition: Adequate nutrition will be maintained 09/24/2017 1648 - Progressing by Greer Eeampbell, Rayson Rando C, RN Coping: Level of anxiety will decrease 09/24/2017 1648 - Progressing by Greer Eeampbell, Assyria Morreale C, RN Elimination: Will not experience complications related to bowel motility 09/24/2017 1648 - Progressing by Greer Eeampbell, Shimika Ames C, RN Will not experience complications related to urinary retention 09/24/2017 1648 - Progressing by Greer Eeampbell, Kierston Plasencia C, RN Pain Managment: General experience of comfort will improve 09/24/2017 1648 - Progressing by Greer Eeampbell, Kielyn Kardell C, RN Safety: Ability to remain free from injury will improve 09/24/2017 1648 - Progressing by Greer Eeampbell, Joene Gelder C, RN Skin Integrity: Risk for impaired skin integrity will decrease 09/24/2017 1648 - Progressing by Greer Eeampbell, Lucine Bilski C, RN

## 2017-09-24 NOTE — Progress Notes (Signed)
C/o anxiety. Large amount of emesis.   Tx with ativan and Zofran

## 2017-09-24 NOTE — H&P (Signed)
[facility] - [place] at [facility]   PATIENT NAME: Derrick Blanchard    MR#:  [id number]  DATE OF BIRTH:  02-13-1981  DATE OF ADMISSION:  09/23/2017  PRIMARY CARE PHYSICIAN: Patient, No Pcp Per   REQUESTING/REFERRING PHYSICIAN:   CHIEF COMPLAINT:   Chief Complaint  Patient presents with  . Chest Pain    HISTORY OF PRESENT ILLNESS: Derrick Blanchard  is a 37 y.o. male with a known history of alcohol abuse and cocaine abuse and liver cirrhosis.  Patient is a poor historian due to alcohol intoxication. He was brought to emergency room for acute onset of nausea vomiting and epigastric abdominal pain, going on for the past 2 days.  The epigastric pain is described as burning 8 out of 10 in severity, without any radiation. Pt states he could not keep any food down.  He has been however, able to drink alcohol and use cocaine.  Upon evaluation in the emergency room, alcohol level is noted to be elevated in 400s; total bilirubin is elevated at 5.8; potassium and magnesium levels are low.  Troponin level is 0.03.  He is tachycardic in 120s.  Abdominal CT scan shows chronic changes from liver cirrhosis, but no acute changes. Patient is admitted for further evaluation and treatment.   PAST MEDICAL HISTORY:   Past Medical History:  Diagnosis Date  . Cirrhosis (HCC)   . Hepatitis     PAST SURGICAL HISTORY:  Past Surgical History:  Procedure Laterality Date  . ESOPHAGOGASTRODUODENOSCOPY (EGD) WITH PROPOFOL N/A 02/03/2015   Procedure: ESOPHAGOGASTRODUODENOSCOPY (EGD) WITH PROPOFOL;  Surgeon: Elnita Maxwell, MD;  Location: [facility] ENDOSCOPY;  Service: Endoscopy;  Laterality: N/A;    SOCIAL HISTORY:  Social History   Tobacco Use  . Smoking status: Current Some Day Smoker    Types: Cigarettes  . Smokeless tobacco: Never Used  Substance Use Topics  . Alcohol use: Yes    FAMILY HISTORY: No family history on file.  DRUG ALLERGIES: No Known Allergies  REVIEW  OF SYSTEMS:   CONSTITUTIONAL: No fever/chills, but he complains of fatigue and generalized weakness.  EYES: No blurred or double vision.  EARS, NOSE, AND THROAT: No tinnitus or ear pain.  RESPIRATORY: No cough, shortness of breath, wheezing or hemoptysis.  CARDIOVASCULAR: No chest pain, orthopnea, edema.  GASTROINTESTINAL: Positive for nausea, vomiting and epigastric pain.  GENITOURINARY: No dysuria, hematuria.  ENDOCRINE: No polyuria, nocturia,  HEMATOLOGY: Positive for easy bruising. SKIN: No rash or ulcers. MUSCULOSKELETAL: No joint pain.   NEUROLOGIC: No focal weakness.  PSYCHIATRY: No anxiety or depression.   MEDICATIONS AT HOME:  Prior to Admission medications   Not on File      PHYSICAL EXAMINATION:   VITAL SIGNS: Blood pressure 124/77, pulse 97, temperature 98.7 F (37.1 C), temperature source Oral, resp. rate 17, height 5\' 8"  (1.727 m), weight 90.7 kg (200 lb), SpO2 98 %.  GENERAL:  37 y.o.-year-old patient lying in the bed, somewhat confused secondary to alcohol intoxication and in mild distress, secondary to epigastric pain.  EYES:  Scleral icterus noted.  HEENT: Head atraumatic, normocephalic. Oropharynx and nasopharynx clear.  NECK:  Supple, no jugular venous distention. No thyroid enlargement, no tenderness.  LUNGS: Normal breath sounds bilaterally, no wheezing, rales,rhonchi or crepitation. No use of accessory muscles of respiration.  CARDIOVASCULAR: S1, S2 normal. No S3/S4.  ABDOMEN: Tender in epigastric area with palpation; otherwise soft, nondistended. Bowel sounds present. No organomegaly or mass.  EXTREMITIES: No pedal edema, cyanosis, or clubbing.  NEUROLOGIC: No focal weakness. Gait is unstable, due to alcohol intoxication.  PSYCHIATRIC: The patient is alert and oriented x 2.  SKIN: No obvious rash, lesion, or ulcer.   LABORATORY PANEL:   CBC Recent Labs  Lab 09/23/17 2135  WBC 7.4  HGB 14.1  HCT 41.0  PLT 48*  MCV 89.2  MCH 30.6  MCHC 34.3   RDW 19.1*   ------------------------------------------------------------------------------------------------------------------  Chemistries  Recent Labs  Lab 09/23/17 2135  NA 135  K 2.7*  CL 96*  CO2 25  GLUCOSE 133*  BUN 6  CREATININE 0.58*  CALCIUM 8.6*  MG 1.3*  AST 281*  ALT 55  ALKPHOS 366*  BILITOT 5.8*   ------------------------------------------------------------------------------------------------------------------ estimated creatinine clearance is 142.3 mL/min (A) (by C-G formula based on SCr of 0.58 mg/dL (L)). ------------------------------------------------------------------------------------------------------------------ No results for input(s): TSH, T4TOTAL, T3FREE, THYROIDAB in the last 72 hours.  Invalid input(s): FREET3   Coagulation profile Recent Labs  Lab 09/23/17 2135  INR 1.25   ------------------------------------------------------------------------------------------------------------------- No results for input(s): DDIMER in the last 72 hours. -------------------------------------------------------------------------------------------------------------------  Cardiac Enzymes Recent Labs  Lab 09/23/17 2135  TROPONINI 0.03*   ------------------------------------------------------------------------------------------------------------------ Invalid input(s): POCBNP  ---------------------------------------------------------------------------------------------------------------  Urinalysis    Component Value Date/Time   COLORURINE AMBER (A) 09/23/2017 2347   APPEARANCEUR CLEAR (A) 09/23/2017 2347   APPEARANCEUR Clear 02/01/2013 1851   LABSPEC 1.012 09/23/2017 2347   LABSPEC 1.015 02/01/2013 1851   PHURINE 7.0 09/23/2017 2347   GLUCOSEU NEGATIVE 09/23/2017 2347   GLUCOSEU 50 mg/dL 40/98/119105/30/2014 [id number]   HGBUR MODERATE (A) 09/23/2017 2347   BILIRUBINUR NEGATIVE 09/23/2017 2347   BILIRUBINUR 2+ 02/01/2013 1851   KETONESUR NEGATIVE  09/23/2017 2347   PROTEINUR 100 (A) 09/23/2017 2347   NITRITE NEGATIVE 09/23/2017 2347   LEUKOCYTESUR NEGATIVE 09/23/2017 2347   LEUKOCYTESUR Negative 02/01/2013 1851     RADIOLOGY: Dg Chest 2 View  Result Date: 09/23/2017 CLINICAL DATA:  Chest pain and nausea EXAM: CHEST  2 VIEW COMPARISON:  Jan 30, 2015 FINDINGS: The heart size and mediastinal contours are within normal limits. Both lungs are clear. The visualized skeletal structures are unremarkable. IMPRESSION: No active cardiopulmonary disease. Electronically Signed   By: Gerome Samavid  Williams III M.D   On: 09/23/2017 22:19   Ct Abdomen Pelvis W Contrast  Result Date: 09/23/2017 CLINICAL DATA:  Chest pain and nausea. History of cirrhosis. Recent cocaine use. EXAM: CT ABDOMEN AND PELVIS WITH CONTRAST TECHNIQUE: Multidetector CT imaging of the abdomen and pelvis was performed using the standard protocol following bolus administration of intravenous contrast. CONTRAST:  100mL ISOVUE-300 IOPAMIDOL (ISOVUE-300) INJECTION 61% COMPARISON:  Jan 30, 2015 FINDINGS: Lower chest: Respiratory motion limits evaluation of the lung bases. 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 Lung bases are otherwise unremarkable. Hepatobiliary: The liver demonstrates a heterogeneous attenuation with multiple rounded and confluent regions of low attenuation. This appearance is nonspecific but very similar in appearance to the Jan 30, 2015 study. The portal vein remains patent. The left portal vein is dilated compared to the other branches and there is recannulization of the umbilical vein with collaterals in the anterior abdominal wall. The patient's varices are more dilated compared to the previous study. There are paraesophageal varices identified. There is cholelithiasis. Pancreas: Unremarkable. No pancreatic ductal dilatation or surrounding inflammatory changes. Spleen: The spleen measures 15.4 cm in cranial caudal dimension which is similar to the previous study.  Adrenals/Urinary Tract: Adrenal glands are normal. No renal stones or masses. The bladder is distended. No ureteral stones. Stomach/Bowel: The stomach and  small bowel are normal. A few scattered colonic diverticuli are seen without diverticulitis. The visualized appendix is normal. Vascular/Lymphatic: The abdominal aorta is nonaneurysmal. No adenopathy. Reproductive: Prostate is unremarkable. Other: No abdominal wall hernia or abnormality. No abdominopelvic ascites. Musculoskeletal: No acute or significant osseous findings. IMPRESSION: 1. The liver is similar in appearance to the Jan 30, 2015 study. The heterogeneous attenuation with multiple low-attenuation nodular and confluent regions is probably due to the patient's reported cirrhosis with regenerating nodules. The lack of significant change in greater than 2 years would argue against a malignant process. That being said, based on imaging alone, underlying neoplasm/malignancy is not excluded. MRI could better assess. 2. 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 3. Cholelithiasis. 4. Splenomegaly. 5. No other acute abnormalities. Electronically Signed   By: Gerome Sam III M.D   On: 09/23/2017 23:17    EKG: Orders placed or performed during the hospital encounter of 09/23/17  . EKG 12-Lead  . EKG 12-Lead  . ED EKG within 10 minutes  . ED EKG within 10 minutes    IMPRESSION AND PLAN:  1.  Acute epigastric pain, likely secondary to acute gastritis.  We will start treatment with PPI. 2.  Alcohol intoxication.  We will start IV fluids and replace electrolytes and vitamins.  Patient would be monitored closely for ETOH withdrawal symptoms, will start on on CIWA protocol.  3.  Cocaine abuse.  Continue telemetry and monitor for withdrawal symptoms. 4.  Tachycardia, likely related to recent cocaine use.  Will use Ativan and beta-blocker, PRN. 5.  Slightly elevated troponin level at 0.03, likely  related to cocaine use.  No acute EKG changes noted; we will continue to monitor. 6.  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  Alcohol cessation was discussed with patient in detail.   All the records are reviewed and case discussed with ED provider. Management plans discussed with the patient, family and they are in agreement.  CODE STATUS: Code Status History    Date Active Date Inactive Code Status Order ID Comments User Context   01/31/2015 01:59 02/07/2015 17:39 Full Code [id number]  Crissie Figures, MD Inpatient       TOTAL TIME TAKING CARE OF THIS PATIENT: 40 minutes.    Cammy Copa M.D on 09/24/2017 at 3:05 AM  Between 7am to 6pm - Pager - [phone]  After 6pm go to [email or web address] - password [email or web address]  [facility]  Office  [phone]  CC: Primary care physician; Patient, No Pcp Per

## 2017-09-24 NOTE — ED Notes (Signed)
Delay for report d/t Charge and RN super request delay d/t a lack of RNs

## 2017-09-24 NOTE — ED Notes (Signed)
Floor awaiting on call nurse to arrive, will hold patient in ED until then.

## 2017-09-24 NOTE — Progress Notes (Signed)
1.  Acute epigastric pain, likely secondary to acute gastritis.  We will start treatment with PPI. 2.  Alcohol intoxication.  We will start IV fluids and replace electrolytes and vitamins.  Patient would be monitored closely for ETOH withdrawal symptoms, will start on on CIWA protocol.  3.  Cocaine abuse.  Continue telemetry and monitor for withdrawal symptoms. 4.  Tachycardia, likely related to recent cocaine use.  Will use Ativan and beta-blocker, PRN. 5.  Slightly elevated troponin level at 0.03, likely related to cocaine use.  No acute EKG changes noted; we will continue to monitor. 6.  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  Alcohol cessation was discussed with patient in detail.  Agree with above

## 2017-09-25 LAB — COMPREHENSIVE METABOLIC PANEL
ALBUMIN: 3.2 g/dL — AB (ref 3.5–5.0)
ALT: 45 U/L (ref 17–63)
AST: 206 U/L — AB (ref 15–41)
Alkaline Phosphatase: 277 U/L — ABNORMAL HIGH (ref 38–126)
Anion gap: 9 (ref 5–15)
BILIRUBIN TOTAL: 10 mg/dL — AB (ref 0.3–1.2)
BUN: 8 mg/dL (ref 6–20)
CHLORIDE: 98 mmol/L — AB (ref 101–111)
CO2: 25 mmol/L (ref 22–32)
Calcium: 7.8 mg/dL — ABNORMAL LOW (ref 8.9–10.3)
Creatinine, Ser: 0.39 mg/dL — ABNORMAL LOW (ref 0.61–1.24)
GFR calc Af Amer: >60 mL/min (ref >60)
GLUCOSE: 110 mg/dL — AB (ref 65–99)
POTASSIUM: 3.1 mmol/L — AB (ref 3.5–5.1)
Sodium: 132 mmol/L — ABNORMAL LOW (ref 135–145)
TOTAL PROTEIN: 7.2 g/dL (ref 6.5–8.1)

## 2017-09-25 LAB — PHOSPHORUS
PHOSPHORUS: 1.2 mg/dL — AB (ref 2.5–4.6)
Phosphorus: 2.3 mg/dL — ABNORMAL LOW (ref 2.5–4.6)

## 2017-09-25 LAB — POTASSIUM: POTASSIUM: 4.1 mmol/L (ref 3.5–5.1)

## 2017-09-25 LAB — CBC
HCT: 34 % — ABNORMAL LOW (ref 40.0–52.0)
HEMOGLOBIN: 11.6 g/dL — AB (ref 13.0–18.0)
MCH: 30.9 pg (ref 26.0–34.0)
MCHC: 34.2 g/dL (ref 32.0–36.0)
MCV: 90.4 fL (ref 80.0–100.0)
PLATELETS: 20 10*3/uL — AB (ref 150–440)
RBC: 3.76 MIL/uL — AB (ref 4.40–5.90)
RDW: 19.1 % — ABNORMAL HIGH (ref 11.5–14.5)
WBC: 2.5 10*3/uL — AB (ref 3.8–10.6)

## 2017-09-25 LAB — MAGNESIUM
MAGNESIUM: 1.2 mg/dL — AB (ref 1.7–2.4)
MAGNESIUM: 1.7 mg/dL (ref 1.7–2.4)

## 2017-09-25 LAB — BASIC METABOLIC PANEL
Anion gap: 13 (ref 5–15)
BUN: 7 mg/dL (ref 6–20)
CHLORIDE: 99 mmol/L — AB (ref 101–111)
CO2: 25 mmol/L (ref 22–32)
Calcium: 7.9 mg/dL — ABNORMAL LOW (ref 8.9–10.3)
Creatinine, Ser: 0.52 mg/dL — ABNORMAL LOW (ref 0.61–1.24)
Glucose, Bld: 101 mg/dL — ABNORMAL HIGH (ref 65–99)
POTASSIUM: 3.1 mmol/L — AB (ref 3.5–5.1)
SODIUM: 137 mmol/L (ref 135–145)

## 2017-09-25 LAB — GLUCOSE, CAPILLARY: Glucose-Capillary: 94 mg/dL (ref 65–99)

## 2017-09-25 MED ORDER — MAGNESIUM SULFATE 4 GM/100ML IV SOLN
4.0000 g | Freq: Once | INTRAVENOUS | Status: AC
  Administered 2017-09-25: 4 g via INTRAVENOUS
  Filled 2017-09-25: qty 100

## 2017-09-25 MED ORDER — NADOLOL 20 MG PO TABS
40.0000 mg | ORAL_TABLET | Freq: Every day | ORAL | Status: DC
  Administered 2017-09-25: 40 mg via ORAL
  Filled 2017-09-25: qty 1
  Filled 2017-09-25: qty 2

## 2017-09-25 MED ORDER — POTASSIUM PHOSPHATES 15 MMOLE/5ML IV SOLN
30.0000 mmol | Freq: Once | INTRAVENOUS | Status: AC
  Administered 2017-09-25: 30 mmol via INTRAVENOUS
  Filled 2017-09-25: qty 10

## 2017-09-25 MED ORDER — MAGNESIUM SULFATE 2 GM/50ML IV SOLN
2.0000 g | Freq: Once | INTRAVENOUS | Status: AC
  Administered 2017-09-25: 2 g via INTRAVENOUS
  Filled 2017-09-25: qty 50

## 2017-09-25 MED ORDER — K PHOS MONO-SOD PHOS DI & MONO 155-852-130 MG PO TABS
500.0000 mg | ORAL_TABLET | ORAL | Status: AC
  Administered 2017-09-25: 500 mg via ORAL
  Filled 2017-09-25 (×2): qty 2

## 2017-09-25 MED ORDER — POTASSIUM CHLORIDE CRYS ER 20 MEQ PO TBCR
40.0000 meq | EXTENDED_RELEASE_TABLET | ORAL | Status: DC
  Administered 2017-09-25: 40 meq via ORAL
  Filled 2017-09-25: qty 2

## 2017-09-25 NOTE — Consult Note (Signed)
MEDICATION RELATED CONSULT NOTE - INITIAL   Pharmacy Consult for electrolytes Indication: electrolyte abnormalities  No Known Allergies  Patient Measurements: Height: 5\' 8"  (172.7 cm) Weight: 200 lb (90.7 kg) IBW/kg (Calculated) : 68.4 Adjusted Body Weight:   Vital Signs: Temp: 98.2 F (36.8 C) (01/21 1956) Temp Source: Oral (01/21 1956) BP: 110/59 (01/21 1956) Pulse Rate: 78 (01/21 1956) Intake/Output from previous day: 01/20 0701 - 01/21 0700 In: 720 [P.O.:720] Out: 150 [Emesis/NG output:150] Intake/Output from this shift: Total I/O In: -  Out: 450 [Urine:450]  Labs: Recent Labs    09/23/17 2135 09/24/17 0803 09/25/17 0342 09/25/17 0921 09/25/17 2050  WBC 7.4 3.6* 2.5*  --   --   HGB 14.1 12.2* 11.6*  --   --   HCT 41.0 36.3* 34.0*  --   --   PLT 48* 27* 20*  --   --   CREATININE 0.58* 0.59* 0.52* 0.39*  --   MG 1.3*  --  1.2*  --  1.7  PHOS  --   --   --  1.2* 2.3*  ALBUMIN 3.9  --   --  3.2*  --   PROT 8.7*  --   --  7.2  --   AST 281*  --   --  206*  --   ALT 55  --   --  45  --   ALKPHOS 366*  --   --  277*  --   BILITOT 5.8*  --   --  10.0*  --   BILIDIR 2.9*  --   --   --   --   IBILI 2.9*  --   --   --   --    Estimated Creatinine Clearance: 142.3 mL/min (A) (by C-G formula based on SCr of 0.39 mg/dL (L)).   Microbiology: No results found for this or any previous visit (from the past 720 hour(s)).  Medical History: Past Medical History:  Diagnosis Date  . Cirrhosis (HCC)   . Hepatitis     Medications:  Scheduled:  . docusate sodium  100 mg Oral BID  . folic acid  1 mg Oral Daily  . Influenza vac split quadrivalent PF  0.5 mL Intramuscular Tomorrow-1000  . multivitamin with minerals  1 tablet Oral Daily  . nadolol  40 mg Oral Daily  . pantoprazole  40 mg Oral Daily  . phosphorus  500 mg Oral Q4H  . pneumococcal 23 valent vaccine  0.5 mL Intramuscular Tomorrow-1000  . potassium chloride  20 mEq Oral Daily  . thiamine  100 mg Oral  Daily   Or  . thiamine  100 mg Intravenous Daily    Assessment: Pt is a 37 year old male with hx of alcohol abuse/cocaine abuse admitted for epigastric/abdominal pain n/v. Found to have K of 3.1 and Mg of 1.2. Add on phos pending.   Goal of Therapy:  Normalization of electrolytes  Plan:  09/25/17 2050 K 4.1, Mg 1.7, phos 2.3. Give K Phos Neutral tabs 500 mg po Q4H x 2 doses and magnesium sulfate 2 gm IV x 1. Recheck electrolytes tomorrow with AM labs.  Robertha Staples A. [place], [place]D, BCPS Clinical Pharmacist 09/25/2017,9:33 PM

## 2017-09-25 NOTE — Progress Notes (Signed)
[facility] -  at [facility]   PATIENT NAME: Derrick Blanchard    MR#:  [id number]  DATE OF BIRTH:  [birth year]  SUBJECTIVE: Admitted for epigastric pain, alcohol intoxication with multiple electrolyte abnormalities.  Today he still has shaking, tachycardia.  No abdominal pain.  No other complaints.  CHIEF COMPLAINT:   Chief Complaint  Patient presents with  . Chest Pain    REVIEW OF SYSTEMS:   ROS CONSTITUTIONAL: No fever, fatigue or weakness.  Still has tremors. EYES: No blurred or double vision.  EARS, NOSE, AND THROAT: No tinnitus or ear pain.  RESPIRATORY: Has some cough. no Shortness of breath, wheezing or hemoptysis.  CARDIOVASCULAR: No chest pain, orthopnea, edema.  GASTROINTESTINAL: No nausea, vomiting, diarrhea or abdominal pain.  GENITOURINARY: No dysuria, hematuria.  ENDOCRINE: No polyuria, nocturia,  HEMATOLOGY: No anemia, easy bruising or bleeding SKIN: No rash or lesion. MUSCULOSKELETAL: No joint pain or arthritis.   NEUROLOGIC: No tingling, numbness, weakness.  PSYCHIATRY: No anxiety or depression.   DRUG ALLERGIES:  No Known Allergies  VITALS:  Blood pressure 133/81, pulse (!) 120, temperature 98.5 F (36.9 C), temperature source Oral, resp. rate 18, height 5\' 8"  (1.727 m), weight 90.7 kg (200 lb), SpO2 100 %.  PHYSICAL EXAMINATION:  GENERAL:  37 y.o.-year-old patient lying in the bed with no acute distress.  EYES: Pupils equal, round, reactive to light and accommodation. No scleral icterus. Extraocular muscles intact.  HEENT: Head atraumatic, normocephalic. Oropharynx and nasopharynx clear.  NECK:  Supple, no jugular venous distention. No thyroid enlargement, no tenderness.  LUNGS: Normal breath sounds bilaterally, no wheezing, rales,rhonchi or crepitation. No use of accessory muscles of respiration.  CARDIOVASCULAR: S1, S2 normal. No murmurs, rubs, or gallops.  ABDOMEN: Soft, nontender, nondistended. Bowel sounds present.  No organomegaly or mass.  EXTREMITIES: No pedal edema, cyanosis, or clubbing.  NEUROLOGIC: Cranial nerves II through XII are intact. Muscle strength 5/5 in all extremities. Sensation intact. Gait not checked.  PSYCHIATRIC: The patient is alert and oriented x 3.  SKIN: No obvious rash, lesion, or ulcer.    LABORATORY PANEL:   CBC Recent Labs  Lab 09/25/17 0342  WBC 2.5*  HGB 11.6*  HCT 34.0*  PLT 20*   ------------------------------------------------------------------------------------------------------------------  Chemistries  Recent Labs  Lab 09/23/17 2135  09/25/17 0342  NA 135   < > 137  K 2.7*   < > 3.1*  CL 96*   < > 99*  CO2 25   < > 25  GLUCOSE 133*   < > 101*  BUN 6   < > 7  CREATININE 0.58*   < > 0.52*  CALCIUM 8.6*   < > 7.9*  MG 1.3*  --  1.2*  AST 281*  --   --   ALT 55  --   --   ALKPHOS 366*  --   --   BILITOT 5.8*  --   --    < > = values in this interval not displayed.   ------------------------------------------------------------------------------------------------------------------  Cardiac Enzymes Recent Labs  Lab 09/24/17 1411  TROPONINI <0.03   ------------------------------------------------------------------------------------------------------------------  RADIOLOGY:  Dg Chest 2 View  Result Date: 09/23/2017 CLINICAL DATA:  Chest pain and nausea EXAM: CHEST  2 VIEW COMPARISON:  Jan 30, 2015 FINDINGS: The heart size and mediastinal contours are within normal limits. Both lungs are clear. The visualized skeletal structures are unremarkable. IMPRESSION: No active cardiopulmonary disease. Electronically Signed   By: Gerome Samavid  Williams III M.D  On: 09/23/2017 22:19   Ct Abdomen Pelvis W Contrast  Result Date: 09/23/2017 CLINICAL DATA:  Chest pain and nausea. History of cirrhosis. Recent cocaine use. EXAM: CT ABDOMEN AND PELVIS WITH CONTRAST TECHNIQUE: Multidetector CT imaging of the abdomen and pelvis was performed using the standard protocol  following bolus administration of intravenous contrast. CONTRAST:  ISOVUE-300 IOPAMIDOL (ISOVUE-300) INJECTION 61% COMPARISON:  Jan 30, 2015 FINDINGS: Lower chest: Respiratory motion limits evaluation of the lung bases. 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 Lung bases are otherwise unremarkable. Hepatobiliary: The liver demonstrates a heterogeneous attenuation with multiple rounded and confluent regions of low attenuation. This appearance is nonspecific but very similar in appearance to the Jan 30, 2015 study. The portal vein remains patent. The left portal vein is dilated compared to the other branches and there is recannulization of the umbilical vein with collaterals in the anterior abdominal wall. The patient's varices are more dilated compared to the previous study. There are paraesophageal varices identified. There is cholelithiasis. Pancreas: Unremarkable. No pancreatic ductal dilatation or surrounding inflammatory changes. Spleen: The spleen measures 15.4 cm in cranial caudal dimension which is similar to the previous study. Adrenals/Urinary Tract: Adrenal glands are normal. No renal stones or masses. The bladder is distended. No ureteral stones. Stomach/Bowel: The stomach and small bowel are normal. A few scattered colonic diverticuli are seen without diverticulitis. The visualized appendix is normal. Vascular/Lymphatic: The abdominal aorta is nonaneurysmal. No adenopathy. Reproductive: Prostate is unremarkable. Other: No abdominal wall hernia or abnormality. No abdominopelvic ascites. Musculoskeletal: No acute or significant osseous findings. IMPRESSION: 1. The liver is similar in appearance to the Jan 30, 2015 study. The heterogeneous attenuation with multiple low-attenuation nodular and confluent regions is probably due to the patient's reported cirrhosis with regenerating nodules. The lack of significant change in greater than 2 years would argue against a malignant process. That being said,  based on imaging alone, underlying neoplasm/malignancy is not excluded. MRI could better assess. 2. 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 3. Cholelithiasis. 4. Splenomegaly. 5. No other acute abnormalities. Electronically Signed   By: Gerome Sam III M.D   On: 09/23/2017 23:17    EKG:   Orders placed or performed during the hospital encounter of 09/23/17  . EKG 12-Lead  . EKG 12-Lead  . ED EKG within 10 minutes  . ED EKG within 10 minutes    ASSESSMENT AND PLAN:   #1 acute epigastric pain due to alcoholic gastritis: Continue PPIs, IV fluids.  Patient clinically better. 2.  Alcohol intoxication: Continue Ativan as per CIWA protocol, last drink was the day before the admission.  Monitor for withdrawal symptoms.  Tachycardia secondary to alcohol withdrawal symptoms: Continue Ativan. 3.  Alcoholic hepatitis, jaundice.  Monitor LFTs, advised cessation from alcohol. 4.  Alcoholic liver cirrhosis with portal hypertension, and esophageal varices. #5. hypokalemia, hypomagnesemia: Replace potassium, magnesium, pharmacy consult for electrolyte management. #6. slightly elevated troponins because of some of her demand ischemia without any chest pain.    All the records are reviewed and case discussed with Care Management/Social Workerr. Management plans discussed with the patient, family and they are in agreement.  CODE STATUS: Full code  TOTAL TIME TAKING CARE OF THIS PATIENT: 35 minutes.   POSSIBLE D/C IN 2-3 days DAYS, DEPENDING ON CLINICAL CONDITION.   Katha Hamming M.D on 09/25/2017 at 9:09 AM  Between 7am to 6pm - Pager - [phone]  After 6pm go to [email or web address] - password [email or web address] [facility]  Office  [phone]  CC: Primary care physician; Patient, No Pcp Per   Note: This dictation was prepared with Dragon dictation along with smaller phrase technology. Any transcriptional errors that  result from this process are unintentional.

## 2017-09-25 NOTE — Consult Note (Addendum)
MEDICATION RELATED CONSULT NOTE - INITIAL   Pharmacy Consult for electrolytes Indication: electrolyte abnormalities  No Known Allergies  Patient Measurements: Height: 5\' 8"  (172.7 cm) Weight: 200 lb (90.7 kg) IBW/kg (Calculated) : 68.4 Adjusted Body Weight:   Vital Signs: Temp: 98.5 F (36.9 C) (01/21 0729) Temp Source: Oral (01/21 0729) BP: 133/81 (01/21 0729) Pulse Rate: 120 (01/21 0729) Intake/Output from previous day: 01/20 0701 - 01/21 0700 In: 720 [P.O.:720] Out: 150 [Emesis/NG output:150] Intake/Output from this shift: No intake/output data recorded.  Labs: Recent Labs    09/23/17 2135 09/24/17 0803 09/25/17 0342  WBC 7.4 3.6* 2.5*  HGB 14.1 12.2* 11.6*  HCT 41.0 36.3* 34.0*  PLT 48* 27* 20*  CREATININE 0.58* 0.59* 0.52*  MG 1.3*  --  1.2*  ALBUMIN 3.9  --   --   PROT 8.7*  --   --   AST 281*  --   --   ALT 55  --   --   ALKPHOS 366*  --   --   BILITOT 5.8*  --   --   BILIDIR 2.9*  --   --   IBILI 2.9*  --   --    Estimated Creatinine Clearance: 142.3 mL/min (A) (by C-G formula based on SCr of 0.52 mg/dL (L)).   Microbiology: No results found for this or any previous visit (from the past 720 hour(s)).  Medical History: Past Medical History:  Diagnosis Date  . Cirrhosis (HCC)   . Hepatitis     Medications:  Scheduled:  . docusate sodium  100 mg Oral BID  . folic acid  1 mg Oral Daily  . Influenza vac split quadrivalent PF  0.5 mL Intramuscular Tomorrow-1000  . multivitamin with minerals  1 tablet Oral Daily  . nadolol  40 mg Oral Daily  . pantoprazole  40 mg Oral Daily  . pneumococcal 23 valent vaccine  0.5 mL Intramuscular Tomorrow-1000  . potassium chloride  20 mEq Oral Daily  . potassium chloride  40 mEq Oral Q4H  . thiamine  100 mg Oral Daily   Or  . thiamine  100 mg Intravenous Daily    Assessment: Pt is a 37 year old male with hx of alcohol abuse/cocaine abuse admitted for epigastric/abdominal pain n/v. Found to have K of 3.1  and Mg of 1.2. Add on phos pending.   Goal of Therapy:  Normalization of electrolytes  Plan:  Mg IV 4g x once Potassium 40 MEQ po q 4 hr x 2 doses Recheck at 1800 Follow up phos level. Replace if low  Thank you for the consult  Olene FlossMelissa D Tiaira Arambula, Pharm.D, BCPS Clinical Pharmacist  09/25/2017,9:46 AM  Addendum: Phos =1.2 I will d/c orders for KCL po 40 MEQ x2 and order potassium phosphate 30mmol IV once. This will provide 45 MEQ of KCL. I will recheck all labs at 2100.

## 2017-09-26 LAB — PHOSPHORUS: Phosphorus: 2.2 mg/dL — ABNORMAL LOW (ref 2.5–4.6)

## 2017-09-26 LAB — MAGNESIUM: Magnesium: 2 mg/dL (ref 1.7–2.4)

## 2017-09-26 LAB — BASIC METABOLIC PANEL
Anion gap: 8 (ref 5–15)
BUN: 8 mg/dL (ref 6–20)
CHLORIDE: 98 mmol/L — AB (ref 101–111)
CO2: 25 mmol/L (ref 22–32)
Calcium: 8.2 mg/dL — ABNORMAL LOW (ref 8.9–10.3)
Creatinine, Ser: 0.68 mg/dL (ref 0.61–1.24)
GFR calc Af Amer: >60 mL/min (ref >60)
GLUCOSE: 120 mg/dL — AB (ref 65–99)
POTASSIUM: 3.5 mmol/L (ref 3.5–5.1)
Sodium: 131 mmol/L — ABNORMAL LOW (ref 135–145)

## 2017-09-26 LAB — GLUCOSE, CAPILLARY: GLUCOSE-CAPILLARY: 103 mg/dL — AB (ref 65–99)

## 2017-09-26 MED ORDER — K PHOS MONO-SOD PHOS DI & MONO 155-852-130 MG PO TABS
500.0000 mg | ORAL_TABLET | Freq: Once | ORAL | Status: AC
  Administered 2017-09-26: 500 mg via ORAL
  Filled 2017-09-26: qty 2

## 2017-09-26 MED ORDER — PREDNISOLONE SODIUM PHOSPHATE 15 MG/5ML PO SOLN: 1.0000 mg/kg | Freq: Every day | ORAL | 0 refills | Status: DC

## 2017-09-26 MED ORDER — NADOLOL 20 MG PO TABS: 20.0000 mg | ORAL_TABLET | Freq: Every day | ORAL | 11 refills | Status: DC

## 2017-09-26 MED ORDER — LORAZEPAM 0.5 MG PO TABS: 0.5000 mg | ORAL_TABLET | Freq: Three times a day (TID) | ORAL | 0 refills | Status: DC

## 2017-09-26 MED ORDER — PANTOPRAZOLE SODIUM 40 MG PO TBEC: 40.0000 mg | DELAYED_RELEASE_TABLET | Freq: Every day | ORAL | 0 refills | Status: DC

## 2017-09-26 MED ORDER — METOPROLOL TARTRATE 25 MG PO TABS: 12.5000 mg | ORAL_TABLET | Freq: Two times a day (BID) | ORAL | 1 refills | Status: DC

## 2017-09-26 MED ORDER — POTASSIUM CHLORIDE 20 MEQ PO PACK: 20.0000 meq | PACK | Freq: Every day | ORAL | 0 refills | Status: DC

## 2017-09-26 MED ORDER — K PHOS MONO-SOD PHOS DI & MONO 155-852-130 MG PO TABS
500.0000 mg | ORAL_TABLET | Freq: Two times a day (BID) | ORAL | Status: DC
  Filled 2017-09-26 (×2): qty 2

## 2017-09-26 NOTE — Clinical Social Work Note (Addendum)
Clinical Social Work Assessment  Patient Details  Name: Derrick Blanchard MRN: [id number] Date of Birth: 1980-10-19  Date of referral:  09/26/17               Reason for consult:  Substance Use/ETOH Abuse                Permission sought to share information with:    Permission granted to share information::     Name::        Agency::     Relationship::     Contact Information:     Housing/Transportation Living arrangements for the past 2 months:  Single Family Home Source of Information:  Patient Patient Interpreter Needed:  None Criminal Activity/Legal Involvement Pertinent to Current Situation/Hospitalization:  No - Comment as needed Significant Relationships:  Other Family Members Lives with:  Relatives Do you feel safe going back to the place where you live?  Yes Need for family participation in patient care:  No (Coment)  Care giving concerns:  Patient lives in [place] with his cousin Derrick Blanchard [phone]).   Social Worker assessment / plan:  Holiday representative (CSW) received substance abuse consult from RN in progression rounds. Social work Theatre manager met with patient alone at bedside. Patient was asleep but easy to wake and was alert and oriented x4. Social work Theatre manager introduced self and explained the role of the [place]. Patient stated that he lives at home with cousin Derrick Blanchard in [place]. Social work Theatre manager provided patient with an outpatient substance abuse Engineer, agricultural the [facility]. Patient shared that he drinks a fifth of liquor everyday by himself and will think about receiving help. Patient plans to go back home with his cousin once discharged. CSW and social work Theatre manager will continue to follow up and assist.  Employment status:  Unemployed Insurance information:  Medicaid In [place] PT Recommendations:  Not assessed at this time Information / Referral to community resources:     Patient/Family's Response to care: Patient stated that  he is agreeable to thinking about receiving help for substance abuse.  Patient/Family's Understanding of and Emotional Response to Diagnosis, Current Treatment, and Prognosis:  Patient was pleasant and thanked social work Theatre manager for her assistance.  Emotional Assessment Appearance:  Appears stated age Attitude/Demeanor/Rapport:    Affect (typically observed):  Pleasant, Calm, Accepting Orientation:  Oriented to Self, Oriented to Place, Oriented to  Time, Oriented to Situation Alcohol / Substance use:  Not Applicable Psych involvement (Current and /or in the community):     Discharge Needs  Concerns to be addressed:  Substance Abuse Concerns Readmission within the last 30 days:  No Current discharge risk:  Substance Abuse Barriers to Discharge:  Active Substance Use   Smith Mince, Student-Social Work 09/26/2017, 12:24 PM

## 2017-09-26 NOTE — Progress Notes (Signed)
Patient is being discharged home. Scripts, meds, and last dose given reviewed. Asked him again, if he wanted to the flu  And pneumococcal vaccines before discharge and he stated "no". IV removed with cath intact. Waiting for his ride at this time.

## 2017-09-26 NOTE — Discharge Summary (Signed)
Derrick Blanchard, is a 37 y.o. male  DOB 1981/08/26  MRN [id number].  Admission date:  09/23/2017  Admitting Physician  Amelia Jo, MD  Discharge Date:  09/26/2017   Primary MD  Patient, No Pcp Per  Recommendations for primary care physician for things to follow:   Patient needs appointment to follow-up with Dr. Jonathon Bellows regarding his liver cirrhosis, portal hypertension, jaundice   Admission Diagnosis  Hypokalemia [E87.6] Hypomagnesemia [X45.03] Alcoholic cirrhosis of liver without ascites ([place]) [K70.30] Alcohol withdrawal syndrome with complication ([place]) [U88.280] Esophageal varices without bleeding, unspecified esophageal varices type ([place]) [I85.00]   Discharge Diagnosis  Hypokalemia [E87.6] Hypomagnesemia [K34.91] Alcoholic cirrhosis of liver without ascites ([place]) [K70.30] Alcohol withdrawal syndrome with complication ([place]) [P91.505] Esophageal varices without bleeding, unspecified esophageal varices type ([place]) [I85.00]   Active Problems:   Alcohol intoxication ([place])      Past Medical History:  Diagnosis Date  . Cirrhosis ([place])   . Hepatitis     Past Surgical History:  Procedure Laterality Date  . ESOPHAGOGASTRODUODENOSCOPY (EGD) WITH PROPOFOL N/A 02/03/2015   Procedure: ESOPHAGOGASTRODUODENOSCOPY (EGD) WITH PROPOFOL;  Surgeon: Josefine Class, MD;  Location: [facility] ENDOSCOPY;  Service: Endoscopy;  Laterality: N/A;       History of present illness and  Hospital Course:     Kindly see H&P for history of present illness and admission details, please review complete Labs, Consult reports and Test reports for all details in brief  HPI  from the history and physical done on the day of admission 37 year old male patient came in because of left-sided chest pain, patient has history of alcohol and  cocaine abuse.  Patient had nausea, abdominal pain, epigastric abdominal pain burning in nature.   Hospital Course   Acute epigastric pain secondary to alcoholic gastritis: Received IV PPIs, IV fluids, improved, patient tolerating the diet. 2.  Alcohol intoxication: Received Ativan as per CIWA protocol, patient had no further shaking or hallucinations, never required ICU Precedex drip.  He had a limited supply of Ativan to help with alcohol withdrawal symptoms.   #3 .alcoholic hepatitis, jaundice: Patient total bilirubin went up to 10 from 5 on admission.  Also noted to have AST of 206, ALT 45 and albumin 3.2 alk phos 277.  Patient is given prednisone 40 mg for 21 days, previously he had jaundice with total bilirubin of 37 2 years ago.  This time I advised him to be careful and follow-up withastroenterologist.  Patient did not see any physician in the long time. 4 .hypokalemia, hypomagnesemia, hypophosphatemia: Secondary to GI losses potassium of 3.1, phosphorus 1.2, magnesium 1.7 b all of them are replaced. Received KCl 20  meq for 3 days at discharge. 5.  Slightly elevated troponin secondary to demand ischemia.  No chest pain. 6.  Alcoholic liver cirrhosis with portal hypertension: Patient received beta-blockers during hospital stay, discharging with metoprolol instead of atenolol because atenolol requires.  Authorization from his insurance. #7 advised to quit alcohol. 8.  Sinus tachycardia secondary to alcohol withdrawal: Also due to electrolyte imbalance .  Received beta-blockers and IV fluids, IV potassium, magnesium.  Heart rate improved.  Discharge Condition: Stable   Follow UP  Follow-up Information    Jonathon Bellows, MD. Schedule an appointment as soon as possible for a visit on 10/12/2017.   Specialty:  Gastroenterology Why:  @ 3:00 pm Contact information: [address] [place] [address] [phone]  Discharge Instructions  and  Discharge  Medications      Allergies as of 09/26/2017   No Known Allergies     Medication List    TAKE these medications   LORazepam 0.5 MG tablet Commonly known as:  ATIVAN Take 1 tablet (0.5 mg total) by mouth every 8 (eight) hours.   nadolol 20 MG tablet Commonly known as:  CORGARD Take 1 tablet (20 mg total) by mouth daily.   pantoprazole 40 MG tablet Commonly known as:  PROTONIX Take 1 tablet (40 mg total) by mouth daily.   potassium chloride 20 MEQ packet Commonly known as:  KLOR-CON Take 20 mEq by mouth daily.   prednisoLONE 15 MG/5ML solution Commonly known as:  ORAPRED Take 30.2 mLs (90.6 mg total) by mouth daily.         Diet and Activity recommendation: See Discharge Instructions above   Consults obtained -none   Major procedures and Radiology Reports - PLEASE review detailed and final reports for all details, in brief -      Dg Chest 2 View  Result Date: 09/23/2017 CLINICAL DATA:  Chest pain and nausea EXAM: CHEST  2 VIEW COMPARISON:  Jan 30, 2015 FINDINGS: The heart size and mediastinal contours are within normal limits. Both lungs are clear. The visualized skeletal structures are unremarkable. IMPRESSION: No active cardiopulmonary disease. Electronically Signed   By: Dorise Bullion III M.D   On: 09/23/2017 22:19   Ct Abdomen Pelvis W Contrast  Result Date: 09/23/2017 CLINICAL DATA:  Chest pain and nausea. History of cirrhosis. Recent cocaine use. EXAM: CT ABDOMEN AND PELVIS WITH CONTRAST TECHNIQUE: Multidetector CT imaging of the abdomen and pelvis was performed using the standard protocol following bolus administration of intravenous contrast. CONTRAST:  180m ISOVUE-300 IOPAMIDOL (ISOVUE-300) INJECTION 61% COMPARISON:  Jan 30, 2015 FINDINGS: Lower chest: Respiratory motion limits evaluation of the lung bases. 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 Lung bases are otherwise unremarkable. Hepatobiliary: The liver demonstrates a heterogeneous attenuation with  multiple rounded and confluent regions of low attenuation. This appearance is nonspecific but very similar in appearance to the Jan 30, 2015 study. The portal vein remains patent. The left portal vein is dilated compared to the other branches and there is recannulization of the umbilical vein with collaterals in the anterior abdominal wall. The patient's varices are more dilated compared to the previous study. There are paraesophageal varices identified. There is cholelithiasis. Pancreas: Unremarkable. No pancreatic ductal dilatation or surrounding inflammatory changes. Spleen: The spleen measures 15.4 cm in cranial caudal dimension which is similar to the previous study. Adrenals/Urinary Tract: Adrenal glands are normal. No renal stones or masses. The bladder is distended. No ureteral stones. Stomach/Bowel: The stomach and small bowel are normal. A few scattered colonic diverticuli are seen without diverticulitis. The visualized appendix is normal. Vascular/Lymphatic: The abdominal aorta is nonaneurysmal. No adenopathy. Reproductive: Prostate is unremarkable. Other: No abdominal wall hernia or abnormality. No abdominopelvic ascites. Musculoskeletal: No acute or significant osseous findings. IMPRESSION: 1. The liver is similar in appearance to the Jan 30, 2015 study. The heterogeneous attenuation with multiple low-attenuation nodular and confluent regions is probably due to the patient's reported cirrhosis with regenerating nodules. The lack of significant change in greater than 2 years would argue against a malignant process. That being said, based on imaging alone, underlying neoplasm/malignancy is not excluded. MRI could better assess. 2. 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 3. Cholelithiasis. 4. Splenomegaly. 5. No other acute abnormalities. Electronically Signed  By: Dorise Bullion III M.D   On: 09/23/2017 23:17    Micro Results    No results  found for this or any previous visit (from the past 240 hour(s)).     Today   Subjective:   Derrick Blanchard today has no abdominal pain.  Tolerating the diet.  Stable for discharge.no further alcohol withdrawal symptoms.not needing IV Ativan.  Objective:   Blood pressure 106/62, pulse 85, temperature 99 F (37.2 C), temperature source Oral, resp. rate 16, height _0  (1.727 m), weight 90.7 kg (200 lb), SpO2 100 %.   Intake/Output Summary (Last 24 hours) at 09/26/2017 1706 Last data filed at 09/26/2017 1441 Gross per 24 hour  Intake 1580 ml  Output 650 ml  Net 930 ml    Exam Awake Alert, Oriented x 3, No new F.N deficits, Normal affect [place].AT,PERRAL Supple Neck,No JVD, No cervical lymphadenopathy appriciated.  Symmetrical Chest wall movement, Good air movement bilaterally, CTAB RRR,No Gallops,Rubs or new Murmurs, No Parasternal Heave +ve B.Sounds, Abd Soft, Non tender, No organomegaly appriciated, No rebound -guarding or rigidity. No Cyanosis, Clubbing or edema, No new Rash or bruise  Data Review   CBC w Diff:  Lab Results  Component Value Date   WBC 2.5 (L) 09/25/2017   HGB 11.6 (L) 09/25/2017   HGB 12.1 (L) 09/20/2013   HCT 34.0 (L) 09/25/2017   HCT 36.8 (L) 09/20/2013   PLT 20 (LL) 09/25/2017   PLT 97 (L) 09/20/2013   LYMPHOPCT 8 05/19/2017   LYMPHOPCT 23.2 09/20/2013   BANDSPCT 0 01/30/2015   MONOPCT 12 05/19/2017   MONOPCT 9.0 09/20/2013   EOSPCT 1 05/19/2017   EOSPCT 1.7 09/20/2013   BASOPCT 1 05/19/2017   BASOPCT 0.5 09/20/2013    CMP:  Lab Results  Component Value Date   NA 131 (L) 09/26/2017   NA 138 02/12/2013   K 3.5 09/26/2017   K 4.7 02/12/2013   CL 98 (L) 09/26/2017   CL 105 02/12/2013   CO2 25 09/26/2017   CO2 27 02/12/2013   BUN 8 09/26/2017   BUN 16 02/12/2013   CREATININE 0.68 09/26/2017   CREATININE 0.51 (L) 02/12/2013   PROT 7.2 09/25/2017   PROT 6.1 (L) 02/11/2013   ALBUMIN 3.2 (L) 09/25/2017   ALBUMIN 2.3 (L) 02/11/2013    BILITOT 10.0 (H) 09/25/2017   BILITOT 9.2 (H) 02/11/2013   ALKPHOS 277 (H) 09/25/2017   ALKPHOS 129 02/11/2013   AST 206 (H) 09/25/2017   AST 105 (H) 02/11/2013   ALT 45 09/25/2017   ALT 44 02/11/2013  .   Total Time in preparing paper work, data evaluation and todays exam - 35 minutes  Epifanio Lesches M.D on 09/26/2017 at 5:06 PM    Note: This dictation was prepared with Dragon dictation along with smaller phrase technology. Any transcriptional errors that result from this process are unintentional.

## 2017-09-26 NOTE — Consult Note (Signed)
MEDICATION RELATED CONSULT NOTE - INITIAL   Pharmacy Consult for electrolytes Indication: electrolyte abnormalities  No Known Allergies  Patient Measurements: Height: 5\' 8"  (172.7 cm) Weight: 200 lb (90.7 kg) IBW/kg (Calculated) : 68.4 Adjusted Body Weight:   Vital Signs: Temp: 98.9 F (37.2 C) (01/22 0454) Temp Source: Oral (01/22 0454) BP: 103/55 (01/22 0454) Pulse Rate: 77 (01/22 0454) Intake/Output from previous day: 01/21 0701 - 01/22 0700 In: 960 [P.O.:960] Out: 650 [Urine:650] Intake/Output from this shift: No intake/output data recorded.  Labs: Recent Labs    09/23/17 2135 09/24/17 0803 09/25/17 0342 09/25/17 0921 09/25/17 2050 09/26/17 0334  WBC 7.4 3.6* 2.5*  --   --   --   HGB 14.1 12.2* 11.6*  --   --   --   HCT 41.0 36.3* 34.0*  --   --   --   PLT 48* 27* 20*  --   --   --   CREATININE 0.58* 0.59* 0.52* 0.39*  --  0.68  MG 1.3*  --  1.2*  --  1.7 2.0  PHOS  --   --   --  1.2* 2.3* 2.2*  ALBUMIN 3.9  --   --  3.2*  --   --   PROT 8.7*  --   --  7.2  --   --   AST 281*  --   --  206*  --   --   ALT 55  --   --  45  --   --   ALKPHOS 366*  --   --  277*  --   --   BILITOT 5.8*  --   --  10.0*  --   --   BILIDIR 2.9*  --   --   --   --   --   IBILI 2.9*  --   --   --   --   --    Estimated Creatinine Clearance: 142.3 mL/min (by C-G formula based on SCr of 0.68 mg/dL).   Microbiology: No results found for this or any previous visit (from the past 720 hour(s)).  Medical History: Past Medical History:  Diagnosis Date  . Cirrhosis (HCC)   . Hepatitis     Medications:  Scheduled:  . docusate sodium  100 mg Oral BID  . folic acid  1 mg Oral Daily  . Influenza vac split quadrivalent PF  0.5 mL Intramuscular Tomorrow-1000  . multivitamin with minerals  1 tablet Oral Daily  . nadolol  40 mg Oral Daily  . pantoprazole  40 mg Oral Daily  . pneumococcal 23 valent vaccine  0.5 mL Intramuscular Tomorrow-1000  . potassium chloride  20 mEq Oral Daily   . thiamine  100 mg Oral Daily   Or  . thiamine  100 mg Intravenous Daily    Assessment: Pt is a 37 year old male with hx of alcohol abuse/cocaine abuse admitted for epigastric/abdominal pain n/v.  1/22: Phos=2.2 Mg=2 K=3.5   Goal of Therapy:  Normalization of electrolytes  Plan:  Pt got 500mg  of Kphos neutral around midnight (only about 3.5hr before lab was drawn) and another 500mg  around 0600 this AM. I will give another additional 2 doses today and follow up with all electrolytes in the AM.  Olene FlossMelissa D Clearance [place], Pharm.D, BCPS Clinical Pharmacist 09/26/2017,7:14 AM

## 2017-09-28 ENCOUNTER — Inpatient Hospital Stay
Admission: EM | Admit: 2017-09-28 | Discharge: 2017-09-29 | DRG: 433 | Discharge status: Against Medical Advice | Payer: Medicaid Other | Attending: Family Medicine | Admitting: Family Medicine

## 2017-09-28 ENCOUNTER — Emergency Department: Payer: Medicaid Other

## 2017-09-28 ENCOUNTER — Encounter: Payer: Self-pay | Admitting: Emergency Medicine

## 2017-09-28 DIAGNOSIS — K8010 Calculus of gallbladder with chronic cholecystitis without obstruction: Secondary | ICD-10-CM | POA: Diagnosis present

## 2017-09-28 DIAGNOSIS — K7010 Alcoholic hepatitis without ascites: Principal | ICD-10-CM | POA: Diagnosis present

## 2017-09-28 DIAGNOSIS — K7030 Alcoholic cirrhosis of liver without ascites: Secondary | ICD-10-CM | POA: Diagnosis present

## 2017-09-28 DIAGNOSIS — R112 Nausea with vomiting, unspecified: Secondary | ICD-10-CM

## 2017-09-28 DIAGNOSIS — F1020 Alcohol dependence, uncomplicated: Secondary | ICD-10-CM | POA: Diagnosis present

## 2017-09-28 DIAGNOSIS — F17210 Nicotine dependence, cigarettes, uncomplicated: Secondary | ICD-10-CM | POA: Diagnosis present

## 2017-09-28 DIAGNOSIS — F10220 Alcohol dependence with intoxication, uncomplicated: Secondary | ICD-10-CM | POA: Diagnosis present

## 2017-09-28 DIAGNOSIS — R1011 Right upper quadrant pain: Secondary | ICD-10-CM | POA: Diagnosis not present

## 2017-09-28 DIAGNOSIS — R109 Unspecified abdominal pain: Secondary | ICD-10-CM

## 2017-09-28 DIAGNOSIS — E876 Hypokalemia: Secondary | ICD-10-CM | POA: Diagnosis present

## 2017-09-28 DIAGNOSIS — R748 Abnormal levels of other serum enzymes: Secondary | ICD-10-CM | POA: Diagnosis not present

## 2017-09-28 DIAGNOSIS — F1410 Cocaine abuse, uncomplicated: Secondary | ICD-10-CM | POA: Diagnosis present

## 2017-09-28 DIAGNOSIS — K766 Portal hypertension: Secondary | ICD-10-CM | POA: Diagnosis present

## 2017-09-28 DIAGNOSIS — K2900 Acute gastritis without bleeding: Secondary | ICD-10-CM | POA: Diagnosis present

## 2017-09-28 DIAGNOSIS — D696 Thrombocytopenia, unspecified: Secondary | ICD-10-CM | POA: Diagnosis present

## 2017-09-28 DIAGNOSIS — F1010 Alcohol abuse, uncomplicated: Secondary | ICD-10-CM | POA: Diagnosis not present

## 2017-09-28 DIAGNOSIS — F1210 Cannabis abuse, uncomplicated: Secondary | ICD-10-CM | POA: Diagnosis present

## 2017-09-28 DIAGNOSIS — K8020 Calculus of gallbladder without cholecystitis without obstruction: Secondary | ICD-10-CM

## 2017-09-28 LAB — COMPREHENSIVE METABOLIC PANEL
ALT: 41 U/L (ref 17–63)
ANION GAP: 9 (ref 5–15)
AST: 162 U/L — ABNORMAL HIGH (ref 15–41)
Albumin: 3.2 g/dL — ABNORMAL LOW (ref 3.5–5.0)
Alkaline Phosphatase: 248 U/L — ABNORMAL HIGH (ref 38–126)
BUN: 8 mg/dL (ref 6–20)
CHLORIDE: 97 mmol/L — AB (ref 101–111)
CO2: 28 mmol/L (ref 22–32)
CREATININE: 0.46 mg/dL — AB (ref 0.61–1.24)
Calcium: 8.7 mg/dL — ABNORMAL LOW (ref 8.9–10.3)
Glucose, Bld: 137 mg/dL — ABNORMAL HIGH (ref 65–99)
POTASSIUM: 3 mmol/L — AB (ref 3.5–5.1)
SODIUM: 134 mmol/L — AB (ref 135–145)
Total Bilirubin: 11.7 mg/dL — ABNORMAL HIGH (ref 0.3–1.2)
Total Protein: 7.4 g/dL (ref 6.5–8.1)

## 2017-09-28 LAB — CBC
HCT: 32.6 % — ABNORMAL LOW (ref 40.0–52.0)
Hemoglobin: 11.1 g/dL — ABNORMAL LOW (ref 13.0–18.0)
MCH: 31.1 pg (ref 26.0–34.0)
MCHC: 34 g/dL (ref 32.0–36.0)
MCV: 91.5 fL (ref 80.0–100.0)
PLATELETS: 48 10*3/uL — AB (ref 150–440)
RBC: 3.56 MIL/uL — AB (ref 4.40–5.90)
RDW: 20.2 % — AB (ref 11.5–14.5)
WBC: 5.2 10*3/uL (ref 3.8–10.6)

## 2017-09-28 LAB — PROTIME-INR
INR: 1.35
PROTHROMBIN TIME: 16.6 s — AB (ref 11.4–15.2)

## 2017-09-28 LAB — ETHANOL: Alcohol, Ethyl (B): <10 mg/dL (ref <10)

## 2017-09-28 LAB — LIPASE, BLOOD: LIPASE: 52 U/L — AB (ref 11–51)

## 2017-09-28 MED ORDER — VITAMIN B-1 100 MG PO TABS: 100.0000 mg | ORAL_TABLET | Freq: Every day | ORAL | Status: DC

## 2017-09-28 MED ORDER — THIAMINE HCL 100 MG/ML IJ SOLN
100.0000 mg | Freq: Every day | INTRAMUSCULAR | Status: DC
  Administered 2017-09-28: 100 mg via INTRAVENOUS
  Filled 2017-09-28: qty 2

## 2017-09-28 MED ORDER — ONDANSETRON HCL 4 MG/2ML IJ SOLN: 4.0000 mg | Freq: Four times a day (QID) | INTRAMUSCULAR | Status: DC | PRN

## 2017-09-28 MED ORDER — LORAZEPAM 2 MG PO TABS: 0.0000 mg | ORAL_TABLET | Freq: Two times a day (BID) | ORAL | Status: DC

## 2017-09-28 MED ORDER — GI COCKTAIL ~~LOC~~
30.0000 mL | Freq: Once | ORAL | Status: AC
  Administered 2017-09-28: 30 mL via ORAL
  Filled 2017-09-28: qty 30

## 2017-09-28 MED ORDER — NADOLOL 20 MG PO TABS
20.0000 mg | ORAL_TABLET | Freq: Every day | ORAL | Status: DC
  Filled 2017-09-28 (×2): qty 1

## 2017-09-28 MED ORDER — POLYETHYLENE GLYCOL 3350 17 G PO PACK: 17.0000 g | PACK | Freq: Every day | ORAL | Status: DC | PRN

## 2017-09-28 MED ORDER — SODIUM CHLORIDE 0.9 % IV BOLUS (SEPSIS)
1000.0000 mL | Freq: Once | INTRAVENOUS | Status: AC
  Administered 2017-09-28: 1000 mL via INTRAVENOUS

## 2017-09-28 MED ORDER — HEPARIN SODIUM (PORCINE) 5000 UNIT/ML IJ SOLN: 5000.0000 [IU] | Freq: Three times a day (TID) | INTRAMUSCULAR | Status: DC

## 2017-09-28 MED ORDER — ACETAMINOPHEN 650 MG RE SUPP: 650.0000 mg | Freq: Four times a day (QID) | RECTAL | Status: DC | PRN

## 2017-09-28 MED ORDER — LORAZEPAM 2 MG PO TABS
0.0000 mg | ORAL_TABLET | Freq: Four times a day (QID) | ORAL | Status: DC
  Filled 2017-09-28: qty 1

## 2017-09-28 MED ORDER — PROMETHAZINE HCL 25 MG/ML IJ SOLN
12.5000 mg | Freq: Once | INTRAMUSCULAR | Status: AC
  Administered 2017-09-28: 12.5 mg via INTRAVENOUS
  Filled 2017-09-28: qty 1

## 2017-09-28 MED ORDER — LORAZEPAM 1 MG PO TABS
1.0000 mg | ORAL_TABLET | Freq: Four times a day (QID) | ORAL | Status: DC | PRN
  Administered 2017-09-28 – 2017-09-29 (×2): 1 mg via ORAL
  Filled 2017-09-28 (×2): qty 1

## 2017-09-28 MED ORDER — ACETAMINOPHEN 325 MG PO TABS: 650.0000 mg | ORAL_TABLET | Freq: Four times a day (QID) | ORAL | Status: DC | PRN

## 2017-09-28 MED ORDER — FOLIC ACID 1 MG PO TABS
1.0000 mg | ORAL_TABLET | Freq: Every day | ORAL | Status: DC
  Administered 2017-09-28: 1 mg via ORAL
  Filled 2017-09-28 (×2): qty 1

## 2017-09-28 MED ORDER — POTASSIUM CHLORIDE 20 MEQ/15ML (10%) PO SOLN
40.0000 meq | Freq: Once | ORAL | Status: AC
  Administered 2017-09-28: 40 meq via ORAL
  Filled 2017-09-28 (×2): qty 30

## 2017-09-28 MED ORDER — LORAZEPAM 2 MG/ML IJ SOLN
0.5000 mg | Freq: Once | INTRAMUSCULAR | Status: AC
  Administered 2017-09-28: 0.5 mg via INTRAVENOUS
  Filled 2017-09-28: qty 1

## 2017-09-28 MED ORDER — ONDANSETRON HCL 4 MG PO TABS: 4.0000 mg | ORAL_TABLET | Freq: Four times a day (QID) | ORAL | Status: DC | PRN

## 2017-09-28 MED ORDER — LORAZEPAM 2 MG/ML IJ SOLN
1.0000 mg | Freq: Four times a day (QID) | INTRAMUSCULAR | Status: DC | PRN
  Administered 2017-09-28 – 2017-09-29 (×2): 1 mg via INTRAVENOUS
  Filled 2017-09-28 (×2): qty 1

## 2017-09-28 MED ORDER — ONDANSETRON HCL 4 MG/2ML IJ SOLN
4.0000 mg | Freq: Once | INTRAMUSCULAR | Status: AC
  Administered 2017-09-28: 4 mg via INTRAVENOUS
  Filled 2017-09-28: qty 2

## 2017-09-28 MED ORDER — LORAZEPAM 0.5 MG PO TABS
0.5000 mg | ORAL_TABLET | Freq: Three times a day (TID) | ORAL | Status: DC
  Filled 2017-09-28: qty 1

## 2017-09-28 MED ORDER — POTASSIUM CHLORIDE 20 MEQ PO PACK
20.0000 meq | PACK | Freq: Every day | ORAL | Status: DC
  Administered 2017-09-28: 20 meq via ORAL
  Filled 2017-09-28: qty 1

## 2017-09-28 MED ORDER — SODIUM CHLORIDE 0.9 % IV SOLN
INTRAVENOUS | Status: DC
  Administered 2017-09-28 (×2): via INTRAVENOUS

## 2017-09-28 MED ORDER — OXYCODONE HCL 5 MG PO TABS: 5.0000 mg | ORAL_TABLET | ORAL | Status: DC | PRN

## 2017-09-28 MED ORDER — PANTOPRAZOLE SODIUM 40 MG IV SOLR
40.0000 mg | Freq: Once | INTRAVENOUS | Status: AC
  Administered 2017-09-28: 40 mg via INTRAVENOUS
  Filled 2017-09-28: qty 40

## 2017-09-28 MED ORDER — SODIUM CHLORIDE 0.9 % IV BOLUS (SEPSIS)
500.0000 mL | Freq: Once | INTRAVENOUS | Status: AC
  Administered 2017-09-28: 500 mL via INTRAVENOUS

## 2017-09-28 MED ORDER — PREDNISOLONE SODIUM PHOSPHATE 15 MG/5ML PO SOLN
1.0000 mg/kg | Freq: Every day | ORAL | Status: DC
  Filled 2017-09-28 (×2): qty 30.2

## 2017-09-28 MED ORDER — ADULT MULTIVITAMIN W/MINERALS CH
1.0000 | ORAL_TABLET | Freq: Every day | ORAL | Status: DC
  Administered 2017-09-28: 1 via ORAL
  Filled 2017-09-28: qty 1

## 2017-09-28 MED ORDER — PANTOPRAZOLE SODIUM 40 MG PO TBEC
40.0000 mg | DELAYED_RELEASE_TABLET | Freq: Every day | ORAL | Status: DC
  Administered 2017-09-28: 40 mg via ORAL
  Filled 2017-09-28: qty 1

## 2017-09-28 NOTE — ED Notes (Signed)
Patient transported to Ultrasound 

## 2017-09-28 NOTE — H&P (Signed)
[employer] [facility] - [place] at [facility]   PATIENT NAME: Derrick Blanchard    MR#:  [id number]  DATE OF BIRTH:  [birth year]  DATE OF ADMISSION:  09/28/2017  PRIMARY CARE PHYSICIAN: Patient, No Pcp Per   REQUESTING/REFERRING PHYSICIAN: dr Fanny Bien  CHIEF COMPLAINT:   Abdominal pain and nausea HISTORY OF PRESENT ILLNESS:  Derrick Blanchard  is a 37 y.o. male with a known history of EtOH related liver cirrhosis with portal hypertension who was discharged 2 days ago with alcohol hepatitis who presents today with right upper quadrant abdominal pain and nausea. Patient reports since this morning he's had right upper quadrant abdominal pain and nausea. He does states that he feels he could drink something now. He denies fever or chills. He was discharged with medications however has not taken any of the medications. His last drink was yesterday. He has not used cocaine however does endorse that he smoked marijuana yesterday. ER physician spoke with GI and surgery as the patient is found to have slightly elevated bilirubin from discharge. His discharge bilirubin was 10 and now it is 11. Right upper quadrant ultrasound does not show evidence of acute cholecystitis.    PAST MEDICAL HISTORY:   Past Medical History:  Diagnosis Date  . Cirrhosis (HCC)   . Hepatitis     PAST SURGICAL HISTORY:   Past Surgical History:  Procedure Laterality Date  . ESOPHAGOGASTRODUODENOSCOPY (EGD) WITH PROPOFOL N/A 02/03/2015   Procedure: ESOPHAGOGASTRODUODENOSCOPY (EGD) WITH PROPOFOL;  Surgeon: Elnita Maxwell, MD;  Location: [facility] ENDOSCOPY;  Service: Endoscopy;  Laterality: N/A;    SOCIAL HISTORY:   Social History   Tobacco Use  . Smoking status: Current Some Day Smoker    Types: Cigarettes  . Smokeless tobacco: Never Used  Substance Use Topics  . Alcohol use: Yes    FAMILY HISTORY:  No family history on file.  DRUG ALLERGIES:  No Known Allergies  REVIEW OF SYSTEMS:   Review of  Systems  Constitutional: Negative.  Negative for chills, fever and malaise/fatigue.  HENT: Negative.  Negative for ear discharge, ear pain, hearing loss, nosebleeds and sore throat.   Eyes: Negative.  Negative for blurred vision and pain.  Respiratory: Negative.  Negative for cough, hemoptysis, shortness of breath and wheezing.   Cardiovascular: Negative.  Negative for chest pain, palpitations and leg swelling.  Gastrointestinal: Positive for abdominal pain and nausea. Negative for blood in stool, diarrhea and vomiting.  Genitourinary: Negative.  Negative for dysuria.  Musculoskeletal: Negative.  Negative for back pain.  Skin: Negative.   Neurological: Negative for dizziness, tremors, speech change, focal weakness, seizures and headaches.  Endo/Heme/Allergies: Negative.  Does not bruise/bleed easily.  Psychiatric/Behavioral: Negative.  Negative for depression, hallucinations and suicidal ideas.    MEDICATIONS AT HOME:   Prior to Admission medications   Medication Sig Start Date End Date Taking? Authorizing Provider  LORazepam (ATIVAN) 0.5 MG tablet Take 1 tablet (0.5 mg total) by mouth every 8 (eight) hours. 09/26/17  Yes Katha Hamming, MD  metoprolol tartrate (LOPRESSOR) 25 MG tablet Take 0.5 tablets (12.5 mg total) by mouth 2 (two) times daily. Patient not taking: Reported on 09/28/2017 09/26/17 09/26/18  Katha Hamming, MD  pantoprazole (PROTONIX) 40 MG tablet Take 1 tablet (40 mg total) by mouth daily. Patient not taking: Reported on 09/28/2017 09/26/17   Katha Hamming, MD  potassium chloride (KLOR-CON) 20 MEQ packet Take 20 mEq by mouth daily. Patient not taking: Reported on 09/28/2017 09/26/17   Katha Hamming, MD  prednisoLONE (ORAPRED) 15 MG/5ML solution Take 30.2 mLs (90.6 mg total) by mouth daily. Patient not taking: Reported on 09/28/2017 09/26/17 09/26/18  Katha HammingKonidena, Snehalatha, MD      VITAL SIGNS:  Blood pressure 122/72, pulse (!) 101, temperature 99.1 F  (37.3 C), temperature source Oral, resp. rate 16, height 5\' 8"  (1.727 m), weight 90.7 kg (200 lb), SpO2 97 %.  PHYSICAL EXAMINATION:   Physical Exam  Constitutional: He is oriented to person, place, and time and well-developed, well-nourished, and in no distress. No distress.  HENT:  Head: Normocephalic.  Eyes: No scleral icterus.  Neck: Normal range of motion. Neck supple. No JVD present. No tracheal deviation present.  Cardiovascular: Normal rate, regular rhythm and normal heart sounds. Exam reveals no gallop and no friction rub.  No murmur heard. Pulmonary/Chest: Effort normal and breath sounds normal. No respiratory distress. He has no wheezes. He has no rales. He exhibits no tenderness.  Abdominal: Soft. Bowel sounds are normal. He exhibits no distension and no mass. There is tenderness (Right upper quadrant). There is no rebound and no guarding.  Musculoskeletal: Normal range of motion. He exhibits no edema.  Neurological: He is alert and oriented to person, place, and time.  Skin: Skin is warm. No rash noted. No erythema.  Psychiatric: Affect and judgment normal.      LABORATORY PANEL:   CBC Recent Labs  Lab 09/28/17 0706  WBC 5.2  HGB 11.1*  HCT 32.6*  PLT 48*   ------------------------------------------------------------------------------------------------------------------  Chemistries  Recent Labs  Lab 09/26/17 0334 09/28/17 0706  NA 131* 134*  K 3.5 3.0*  CL 98* 97*  CO2 25 28  GLUCOSE 120* 137*  BUN 8 8  CREATININE 0.68 0.46*  CALCIUM 8.2* 8.7*  MG 2.0  --   AST  --  162*  ALT  --  41  ALKPHOS  --  248*  BILITOT  --  11.7*   ------------------------------------------------------------------------------------------------------------------  Cardiac Enzymes Recent Labs  Lab 09/24/17 1411  TROPONINI <0.03   ------------------------------------------------------------------------------------------------------------------  RADIOLOGY:  Dg Abd 2  Views  Result Date: 09/28/2017 CLINICAL DATA:  Right upper quadrant abdominal pain, bilateral flank pain, nausea and vomiting. EXAM: ABDOMEN - 2 VIEW COMPARISON:  CT of the abdomen and pelvis on 09/23/2017 FINDINGS: No evidence of bowel obstruction, ileus or free air. No abnormal calcifications. Bony structures are unremarkable. No visualized soft tissue abnormalities. IMPRESSION: No acute findings. Electronically Signed   By: Irish LackGlenn  Yamagata M.D.   On: 09/28/2017 07:43   [place] Abdomen Limited Ruq  Result Date: 09/28/2017 CLINICAL DATA:  Abdominal pain, history of cirrhosis EXAM: ULTRASOUND ABDOMEN LIMITED RIGHT UPPER QUADRANT COMPARISON:  CT 09/23/2017 FINDINGS: Gallbladder: Sludge and gallstones noted within the gallbladder. Largest gallstone 8 mm. There is borderline wall thickening at 3 mm. Small amount of pericholecystic fluid noted. Negative sonographic Murphy's sign. Common bile duct: Diameter: Normal caliber, 4 mm Liver: Markedly heterogeneous echotexture throughout the liver. Subtle nodularity of the contours. Findings compatible with cirrhosis. Portal vein is patent with direction of flow toward the liver. Portal vein however is prominent/enlarged. There appears to be a recanalized umbilical vein. Findings compatible with portal venous hypertension. IMPRESSION: Sludge and gallstones noted within the gallbladder. Gallbladder wall thickening and pericholecystic fluid noted without sonographic Murphy sign. These findings could be related to cholecystitis or intrinsic liver disease. Recommend clinical correlation. Changes of cirrhosis within the liver with portal venous hypertension. Electronically Signed   By: Charlett NoseKevin  Dover M.D.   On: 09/28/2017 11:11  EKG:   Sinus tachycardia no ST elevation or depression  IMPRESSION AND PLAN:   37 year old male with EtOH related liver cirrhosis who presents with right upper quadrant abdominal pain and nausea.  1. Right upper quadrant abdominal pain with  nausea: Ultrasound showing sludge and gallstones. It is questionable if patient has acute cholecystitis although patient has no fever and negative sonographic Murphy sign I will order HIDA scan for a.m. Surgical and GI evaluation for further evaluation.  2. Elevated bilirubin: Surgery was concerned of the elevated bilirubin Will repeat in a.m. Await surgical and GI evaluation  3. Liver cirrhosis without ascites and history of portal hypertension: Continue nadolol  4. Recent diagnosis of the EtOH hepatitis: Continue prednisolone  5. Hypokalemia: Replete and recheck in a.m. Check Mg 6. EtOH abuse: CIWA protocol initiated  7.Tobacco dependence: Patient is encouraged to quit smoking. Counseling was provided for 4 minutes.  8. Acute gastritis: Continue PPI Long discussion with patient about abstinent from EtOH, marijuana, cocaine and tobacco dependence.  All the records are reviewed and case discussed with ED provider. Management plans discussed with the patient and he is in agreement  CODE STATUS: full  TOTAL TIME TAKING CARE OF THIS PATIENT: 41 minutes.    Maher Shon M.D on 09/28/2017 at 12:40 PM  Between 7am to 6pm - Pager - [phone]  After 6pm go to [email or web address] - password [email or web address]  [facility]  Office  [phone]  CC: Primary care physician; Patient, No Pcp Per

## 2017-09-28 NOTE — ED Provider Notes (Signed)
[facility] Emergency Department Provider Note  ____________________________________________   First MD Initiated Contact with Patient 09/28/17 0703     (approximate)  I have reviewed the triage vital signs and the nursing notes.   HISTORY  Chief Complaint Emesis and Chest Pain    HPI Derrick Blanchard is a 37 y.o. male presents for evaluation of vomiting and upset stomach  Patient reports that at 3 AM he began experiencing vomiting.  Reports he is vomiting up water and yellow.  Reports he had the same and was admitted to the hospital, and familiar with him as I had admitted him.  Denies chest pain.  Reports a burning acidic feeling after vomiting in his lower chest.  No trouble breathing.  No weakness or lightheadedness, but reports that he has not been able to keep anything on his stomach today.  No black or bloody emesis.  No diarrhea.  Reports moving his bowels normally yesterday.  Denies overdose or ingestion.  Does report using alcohol last night, he resumed this after leaving the hospital.  Also reports using marijuana last night.  Has not taken any medicine  Past Medical History:  Diagnosis Date  . Cirrhosis (HCC)   . Hepatitis     Patient Active Problem List   Diagnosis Date Noted  . Alcohol intoxication (HCC) 09/24/2017  . Alcohol dependence (HCC) 02/03/2015  . Alcohol dependence with uncomplicated withdrawal (HCC)   . Alcoholic hepatitis 01/30/2015  . Alcohol use 01/30/2015  . Melena 01/30/2015  . Coagulopathy (HCC) 01/30/2015  . Hyponatremia 01/30/2015  . Bruise, trunk 01/30/2015  . Anemia 01/30/2015    Past Surgical History:  Procedure Laterality Date  . ESOPHAGOGASTRODUODENOSCOPY (EGD) WITH PROPOFOL N/A 02/03/2015   Procedure: ESOPHAGOGASTRODUODENOSCOPY (EGD) WITH PROPOFOL;  Surgeon: Elnita MaxwellMatthew Gordon Rein, MD;  Location[facility] ENDOSCOPY;  Service: Endoscopy;  Laterality: N/A;    Prior to Admission medications   Medication  Sig Start Date End Date Taking? Authorizing Provider  LORazepam (ATIVAN) 0.5 MG tablet Take 1 tablet (0.5 mg total) by mouth every 8 (eight) hours. 09/26/17  Yes Katha HammingKonidena, Snehalatha, MD  metoprolol tartrate (LOPRESSOR) 25 MG tablet Take 0.5 tablets (12.5 mg total) by mouth 2 (two) times daily. Patient not taking: Reported on 09/28/2017 09/26/17 09/26/18  Katha HammingKonidena, Snehalatha, MD  pantoprazole (PROTONIX) 40 MG tablet Take 1 tablet (40 mg total) by mouth daily. Patient not taking: Reported on 09/28/2017 09/26/17   Katha HammingKonidena, Snehalatha, MD  potassium chloride (KLOR-CON) 20 MEQ packet Take 20 mEq by mouth daily. Patient not taking: Reported on 09/28/2017 09/26/17   Katha HammingKonidena, Snehalatha, MD  prednisoLONE (ORAPRED) 15 MG/5ML solution Take 30.2 mLs (90.6 mg total) by mouth daily. Patient not taking: Reported on 09/28/2017 09/26/17 09/26/18  Katha HammingKonidena, Snehalatha, MD    Allergies Patient has no known allergies.  No family history on file.  Social History Social History   Tobacco Use  . Smoking status: Current Some Day Smoker    Types: Cigarettes  . Smokeless tobacco: Never Used  Substance Use Topics  . Alcohol use: Yes  . Drug use: Yes    Types: Marijuana, Cocaine    Review of Systems Constitutional: No fever/chills Eyes: No visual changes. ENT: No sore throat. Cardiovascular: Denies chest pain. Respiratory: Denies shortness of breath. Gastrointestinal: No abdominal pain.  Reports his stomach just feels "upset"No diarrhea.  No constipation. Genitourinary: Negative for dysuria. Musculoskeletal: Negative for back pain. Skin: Negative for rash. Neurological: Negative for headaches, focal weakness or numbness.  ____________________________________________   PHYSICAL EXAM:  VITAL SIGNS: ED Triage Vitals  Enc Vitals Group     BP 09/28/17 0705 123/63     Pulse Rate 09/28/17 0703 (!) 120     Resp 09/28/17 0703 16     Temp 09/28/17 0703 99.1 F (37.3 C)     Temp Source 09/28/17 0703  Oral     SpO2 09/28/17 0703 100 %     Weight 09/28/17 0704 200 lb (90.7 kg)     Height 09/28/17 0704 5\' 8"  (1.727 m)     Head Circumference --      Peak Flow --      Pain Score 09/28/17 0703 4     Pain Loc --      Pain Edu? --      Excl. in GC? --     Constitutional: Alert and oriented.  Chronically ill and clearly jaundiced.  Does not appear in acute distress.  Eyes: Conjunctivae are normal. Head: Atraumatic. Nose: No congestion/rhinnorhea. Mouth/Throat: Mucous membranes are moist. Neck: No stridor.   Cardiovascular: Tachycardic rate, regular rhythm. Grossly normal heart sounds.  Good peripheral circulation. Respiratory: Normal respiratory effort.  No retractions. Lungs CTAB. Gastrointestinal: Soft and nontender except reporting some mild "discomfort" in his mid epigastrium.  No rebound or guarding.  No evidence of peritonitis.  No distention.. No distention. Musculoskeletal: No lower extremity tenderness nor edema. Neurologic:  Normal speech and language. No gross focal neurologic deficits are appreciated.  Skin:  Skin is warm, dry and intact. No rash noted except for multiple spider angiomata.  He does have a few small bruises on his arms, seem to recall that bruise on his right arm has been there since his previous admission. Psychiatric: Mood and affect are normal. Speech and behavior are normal.  Denies hallucinations.  Does not appear diaphoretic.  No tremors.  ____________________________________________   LABS (all labs ordered are listed, but only abnormal results are displayed)  Labs Reviewed  PROTIME-INR - Abnormal; Notable for the following components:      Result Value   Prothrombin Time 16.6 (*)    All other components within normal limits  CBC - Abnormal; Notable for the following components:   RBC 3.56 (*)    Hemoglobin 11.1 (*)    HCT 32.6 (*)    RDW 20.2 (*)    Platelets 48 (*)    All other components within normal limits  COMPREHENSIVE METABOLIC PANEL -  Abnormal; Notable for the following components:   Sodium 134 (*)    Potassium 3.0 (*)    Chloride 97 (*)    Glucose, Bld 137 (*)    Creatinine, Ser 0.46 (*)    Calcium 8.7 (*)    Albumin 3.2 (*)    AST 162 (*)    Alkaline Phosphatase 248 (*)    Total Bilirubin 11.7 (*)    All other components within normal limits  LIPASE, BLOOD - Abnormal; Notable for the following components:   Lipase 52 (*)    All other components within normal limits  ETHANOL   ____________________________________________  EKG  Reviewed interrupt me at 7:05 AM Her C4 59 Sinus tachycardia, no evidence of acute ischemia ____________________________________________  RADIOLOGY  Dg Abd 2 Views  Result Date: 09/28/2017 CLINICAL DATA:  Right upper quadrant abdominal pain, bilateral flank pain, nausea and vomiting. EXAM: ABDOMEN - 2 VIEW COMPARISON:  CT of the abdomen and pelvis on 09/23/2017 FINDINGS: No evidence of bowel obstruction, ileus or free air. No  abnormal calcifications. Bony structures are unremarkable. No visualized soft tissue abnormalities. IMPRESSION: No acute findings. Electronically Signed   By: Irish Lack M.D.   On: 09/28/2017 07:43   [place] Abdomen Limited Ruq  Result Date: 09/28/2017 CLINICAL DATA:  Abdominal pain, history of cirrhosis EXAM: ULTRASOUND ABDOMEN LIMITED RIGHT UPPER QUADRANT COMPARISON:  CT 09/23/2017 FINDINGS: Gallbladder: Sludge and gallstones noted within the gallbladder. Largest gallstone 8 mm. There is borderline wall thickening at 3 mm. Small amount of pericholecystic fluid noted. Negative sonographic Murphy's sign. Common bile duct: Diameter: Normal caliber, 4 mm Liver: Markedly heterogeneous echotexture throughout the liver. Subtle nodularity of the contours. Findings compatible with cirrhosis. Portal vein is patent with direction of flow toward the liver. Portal vein however is prominent/enlarged. There appears to be a recanalized umbilical vein. Findings compatible with  portal venous hypertension. IMPRESSION: Sludge and gallstones noted within the gallbladder. Gallbladder wall thickening and pericholecystic fluid noted without sonographic Murphy sign. These findings could be related to cholecystitis or intrinsic liver disease. Recommend clinical correlation. Changes of cirrhosis within the liver with portal venous hypertension. Electronically Signed   By: Charlett Nose M.D.   On: 09/28/2017 11:11    Abdominal x-ray result, no acute reviewed by me ____________________________________________   PROCEDURES  Procedure(s) performed: None  Procedures  Critical Care performed: No  ____________________________________________   INITIAL IMPRESSION / ASSESSMENT AND PLAN / ED COURSE  Pertinent labs & imaging results that were available during my care of the patient were reviewed by me and considered in my medical decision making (see chart for details).  Differential diagnosis includes but is not limited to, abdominal perforation, aortic dissection, cholecystitis, appendicitis, diverticulitis, colitis, esophagitis/gastritis, kidney stone, pyelonephritis, urinary tract infection, aortic aneurysm. All are considered in decision and treatment plan. Based upon the patient's presentation and risk factors, and the patient's recent workup and admission I suspect likely the patient is suffering ongoing gastritis, likely due to reinitiation of alcohol.  He also appears to have chronic liver disease, known varices, but denies any signs or symptoms of bleeding.  Do not believe repeat CT is warranted at this time, he reports minimal discomfort appears most consistent with likely gastritis possibly of alcoholic source, gastropathy, or peptic ulcer disease.  No signs or symptoms suggest acute peritonitis or intra-abdominal infection.  Plan to hydrate the patient generously, recheck labs including INR, LFTs, lipase, treat symptomatically. Start IV PPI.     Clinical Course as of  Sep 28 1230  Thu Sep 28, 2017  0808 Further emesis.  Patient resting comfortably, alert on his side.  Reports his symptoms are feeling improved.  [MQ]  0856 Potassium again noted to be low.  No ongoing nausea patient does appear improved.  We will give oral potassium at this time.  Plan to p.o. challenge.  [MQ]  1144 Dr. Aleen Campi in OR, will call back shortly after case.  [MQ]  1215 Dr. Aleen Campi advises patient is a very poor surgical candidate and he would NOT take patient to the OR.   [MQ]    Clinical Course User Index [MQ] Sharyn Creamer, MD   ----------------------------------------- 12:32 PM on 09/28/2017 -----------------------------------------  Discussed patient case with general surgery, he advises the patient needs further evaluation and admission for uptrending T bili, discussed with hospitalist Dr. Juliene Pina advised admission for HIDA scan and GI consultation.  Discussed with the patient is a agreeable with this plan.  Currently resting, further workup to evaluate for cholecystitis and gastroenterology consult planned at this time  ____________________________________________   FINAL CLINICAL IMPRESSION(S) / ED DIAGNOSES  Final diagnoses:  Abdominal pain  Non-intractable vomiting with nausea, unspecified vomiting type  Hyperbilirubinemia  Gallstones      NEW MEDICATIONS STARTED DURING THIS VISIT:  New Prescriptions   No medications on file     Note:  This document was prepared using Dragon voice recognition software and may include unintentional dictation errors.     Sharyn Creamer, MD 09/28/17 1233

## 2017-09-28 NOTE — Consult Note (Signed)
Date of Consultation:  09/28/2017  Requesting Physician:  Dr. Juliene Pina  Reason for Consultation:  Elevated LFTs  History of Present Illness: Derrick Blanchard is a 37 y.o. male who presents with a history of alcohol cirrhosis with portal hypertension and esophageal varices.  He was recently in the hospital and was discharged on 1/22 with alcoholic hepatitis and alcohol withdrawal.  He presents today to the hospital because he started having again right upper quadrant pain with some nausea.  Labs showed elevated LFTs again and ultrasound was obtained showing gallstones with mild wall thickening and pericholecystic fluid.  Patient says now that he's feeling better than on admission.  Has not had further nausea.  Past Medical History: Past Medical History:  Diagnosis Date  . Cirrhosis (HCC)   . Hepatitis      Past Surgical History: Past Surgical History:  Procedure Laterality Date  . ESOPHAGOGASTRODUODENOSCOPY (EGD) WITH PROPOFOL N/A 02/03/2015   Procedure: ESOPHAGOGASTRODUODENOSCOPY (EGD) WITH PROPOFOL;  Surgeon: Elnita Maxwell, MD;  [facility];  Service: Endoscopy;  Laterality: N/A;    Home Medications: Prior to Admission medications   Medication Sig Start Date End Date Taking? Authorizing Provider  LORazepam (ATIVAN) 0.5 MG tablet Take 1 tablet (0.5 mg total) by mouth every 8 (eight) hours. 09/26/17  Yes Katha Hamming, MD  metoprolol tartrate (LOPRESSOR) 25 MG tablet Take 0.5 tablets (12.5 mg total) by mouth 2 (two) times daily. Patient not taking: Reported on 09/28/2017 09/26/17 09/26/18  Katha Hamming, MD  pantoprazole (PROTONIX) 40 MG tablet Take 1 tablet (40 mg total) by mouth daily. Patient not taking: Reported on 09/28/2017 09/26/17   Katha Hamming, MD  potassium chloride (KLOR-CON) 20 MEQ packet Take 20 mEq by mouth daily. Patient not taking: Reported on 09/28/2017 09/26/17   Katha Hamming, MD  prednisoLONE (ORAPRED) 15 MG/5ML solution Take  30.2 mLs (90.6 mg total) by mouth daily. Patient not taking: Reported on 09/28/2017 09/26/17 09/26/18  Katha Hamming, MD    Allergies: No Known Allergies  Social History:  reports that he has been smoking cigarettes.  he has never used smokeless tobacco. He reports that he drinks alcohol. He reports that he uses drugs. Drugs: Marijuana and Cocaine.   Family History: No family history on file.  Review of Systems: Review of Systems  Constitutional: Negative for chills and fever.  HENT: Negative for hearing loss.   Respiratory: Negative for shortness of breath.   Cardiovascular: Negative for chest pain.  Gastrointestinal: Positive for abdominal pain and nausea. Negative for constipation, diarrhea and vomiting.  Genitourinary: Negative for dysuria.  Musculoskeletal: Negative for myalgias.  Skin: Negative for rash.  Neurological: Negative for dizziness.  Psychiatric/Behavioral: Negative for depression.  All other systems reviewed and are negative.   Physical Exam BP 111/71 (BP Location: Left Arm)   Pulse 94   Temp 98.7 F (37.1 C) (Oral)   Resp 19   Ht 5\' 8"  (1.727 m)   Wt 90.7 kg (200 lb)   SpO2 99%   BMI 30.41 kg/m  CONSTITUTIONAL: No acute distress.  Patient is jaundiced. HEENT:  Normocephalic, atraumatic, extraocular motion intact. NECK: Trachea is midline, and there is no jugular venous distension.  RESPIRATORY:  Lungs are clear, and breath sounds are equal bilaterally. Normal respiratory effort without pathologic use of accessory muscles. CARDIOVASCULAR: Heart is regular without murmurs, gallops, or rubs. GI: The abdomen is soft, nondistended, with some tenderness in the right upper quadrant but negative murphy's sign.  MUSCULOSKELETAL:  Normal muscle strength and  tone in all four extremities.  No peripheral edema or cyanosis. SKIN: Skin turgor is normal. There are no pathologic skin lesions.  NEUROLOGIC:  Motor and sensation is grossly normal.  Cranial nerves are  grossly intact. PSYCH:  Alert and oriented to person, place and time. Affect is normal.  Laboratory Analysis: Results for orders placed or performed during the hospital encounter of 09/28/17 (from the past 24 hour(s))  Protime-INR     Status: Abnormal   Collection Time: 09/28/17  7:06 AM  Result Value Ref Range   Prothrombin Time 16.6 (H) 11.4 - 15.2 seconds   INR 1.35   CBC     Status: Abnormal   Collection Time: 09/28/17  7:06 AM  Result Value Ref Range   WBC 5.2 3.8 - 10.6 K/uL   RBC 3.56 (L) 4.40 - 5.90 MIL/uL   Hemoglobin 11.1 (L) 13.0 - 18.0 g/dL   HCT 16.1 (L) 09.6 - 04.5 %   MCV 91.5 80.0 - 100.0 fL   MCH 31.1 26.0 - 34.0 pg   MCHC 34.0 32.0 - 36.0 g/dL   RDW 40.9 (H) 81.1 - 91.4 %   Platelets 48 (L) 150 - 440 K/uL  Comprehensive metabolic panel     Status: Abnormal   Collection Time: 09/28/17  7:06 AM  Result Value Ref Range   Sodium 134 (L) 135 - 145 mmol/L   Potassium 3.0 (L) 3.5 - 5.1 mmol/L   Chloride 97 (L) 101 - 111 mmol/L   CO2 28 22 - 32 mmol/L   Glucose, Bld 137 (H) 65 - 99 mg/dL   BUN 8 6 - 20 mg/dL   Creatinine, Ser 7.82 (L) 0.61 - 1.24 mg/dL   Calcium 8.7 (L) 8.9 - 10.3 mg/dL   Total Protein 7.4 6.5 - 8.1 g/dL   Albumin 3.2 (L) 3.5 - 5.0 g/dL   AST 956 (H) 15 - 41 U/L   ALT 41 17 - 63 U/L   Alkaline Phosphatase 248 (H) 38 - 126 U/L   Total Bilirubin 11.7 (H) 0.3 - 1.2 mg/dL   GFR calc non Af Amer >60 >60 mL/min   GFR calc Af Amer >60 >60 mL/min   Anion gap 9 5 - 15  Lipase, blood     Status: Abnormal   Collection Time: 09/28/17  7:06 AM  Result Value Ref Range   Lipase 52 (H) 11 - 51 U/L  Ethanol     Status: None   Collection Time: 09/28/17  7:06 AM  Result Value Ref Range   Alcohol, Ethyl (B) <10 <10 mg/dL    Imaging: Dg Abd 2 Views  Result Date: 09/28/2017 CLINICAL DATA:  Right upper quadrant abdominal pain, bilateral flank pain, nausea and vomiting. EXAM: ABDOMEN - 2 VIEW COMPARISON:  CT of the abdomen and pelvis on 09/23/2017 FINDINGS:  No evidence of bowel obstruction, ileus or free air. No abnormal calcifications. Bony structures are unremarkable. No visualized soft tissue abnormalities. IMPRESSION: No acute findings. Electronically Signed   By: Irish Lack M.D.   On: 09/28/2017 07:43   US Abdomen Limited Ruq  Result Date: 09/28/2017 CLINICAL DATA:  Abdominal pain, history of cirrhosis EXAM: ULTRASOUND ABDOMEN LIMITED RIGHT UPPER QUADRANT COMPARISON:  CT 09/23/2017 FINDINGS: Gallbladder: Sludge and gallstones noted within the gallbladder. Largest gallstone 8 mm. There is borderline wall thickening at 3 mm. Small amount of pericholecystic fluid noted. Negative sonographic Murphy's sign. Common bile duct: Diameter: Normal caliber, 4 mm Liver: Markedly heterogeneous echotexture throughout the liver. Subtle  nodularity of the contours. Findings compatible with cirrhosis. Portal vein is patent with direction of flow toward the liver. Portal vein however is prominent/enlarged. There appears to be a recanalized umbilical vein. Findings compatible with portal venous hypertension. IMPRESSION: Sludge and gallstones noted within the gallbladder. Gallbladder wall thickening and pericholecystic fluid noted without sonographic Murphy sign. These findings could be related to cholecystitis or intrinsic liver disease. Recommend clinical correlation. Changes of cirrhosis within the liver with portal venous hypertension. Electronically Signed   By: Charlett NoseKevin  Dover M.D.   On: 09/28/2017 11:11    Assessment and Plan: This is a 37 y.o. male who presents with abdominal pain in the setting of alcoholic hepatitis and chronic liver cirrhosis.  I have independently viewed the patient's imaging studies and reviewed his laboratory studies.  Overall there is very minimal pericholecystic fluid in the gallbladder wall measures 3 mm.  His LFTs are the issue of concern with a total bilirubin of 11.7.  The patient is currently admitted to the medical team.  He is set to  have a HIDA scan tomorrow.  This will better evaluate his gallbladder as a potential source of his pain.  However despite of any findings, he is at a tremendous risk of perioperative complication and death given his significant liver disease.  Would not recommend any surgical intervention at this point.  He has pretty significant thrombocytopenia, and overall is child class B which puts him at a 30% plus risk of perioperative mortality.  If he does have cholecystitis, he should be treated with nonoperative management with IV antibiotics.  If he does require surgery, he should be transferred to a tertiary care center.  Unfortunately there is no surgical role for him here.   Howie IllJose Luis Diem Dicocco, MD [facility]

## 2017-09-28 NOTE — Consult Note (Addendum)
Derrick Blanchard , MD [address], [place], [place], [place], [address] [address] [place], [place], [place], [address] Phone: [phone]  Fax: [phone]  Consultation  Referring Provider:  Dr Benjie Karvonen  Primary Care Physician:  Patient, No Pcp Per Primary Gastroenterologist: [facility]      Reason for Consultation:     Abnormal LFT's   Date of Admission:  09/28/2017 Date of Consultation:  09/28/2017         HPI:   Derrick Blanchard is a 37 y.o. male with a history of alcoholic hepatitis . He has presented to the ER in the past with alcoholic intoxication.   He was recently admitted on 09/23/17 with acute epigastric pain , treated as acute alcoholic gastritis, found to have elevated LFT;s , treated as alcoholic hepatitis and commenced on steroids with outpatient GI follow up. GI not seen the patient. CT scan in 09/2017 shows features of portal hypertension , cirrhosis . Non specific lesions seen on the liver requiring MRI , chololithiasis and splenomegaly.   He comes into the hospital today with RUQ pain, nausea. He underwent a RUQ USG which demonstrated sludge and gall stones within the gall bladder with gall bladder wall thickening and pericholecystic fluid . He has consumed large qtyof alcohol since age 49 , last drink was yesterday, has used cocaine in thepast . No abdominal pain presently   LFT's elevated at 248 alk phos and T bil 11.7    Past Medical History:  Diagnosis Date  . Cirrhosis ([place])   . Hepatitis     Past Surgical History:  Procedure Laterality Date  . ESOPHAGOGASTRODUODENOSCOPY (EGD) WITH PROPOFOL N/A 02/03/2015   Procedure: ESOPHAGOGASTRODUODENOSCOPY (EGD) WITH PROPOFOL;  Surgeon: Josefine Class, MD;  Location: [facility] ENDOSCOPY;  Service: Endoscopy;  Laterality: N/A;    Prior to Admission medications   Medication Sig Start Date End Date Taking? Authorizing Provider  LORazepam (ATIVAN) 0.5 MG tablet Take 1 tablet (0.5 mg total) by mouth every 8 (eight) hours.  09/26/17  Yes Epifanio Lesches, MD  metoprolol tartrate (LOPRESSOR) 25 MG tablet Take 0.5 tablets (12.5 mg total) by mouth 2 (two) times daily. Patient not taking: Reported on 09/28/2017 09/26/17 09/26/18  Epifanio Lesches, MD  pantoprazole (PROTONIX) 40 MG tablet Take 1 tablet (40 mg total) by mouth daily. Patient not taking: Reported on 09/28/2017 09/26/17   Epifanio Lesches, MD  potassium chloride (KLOR-CON) 20 MEQ packet Take 20 mEq by mouth daily. Patient not taking: Reported on 09/28/2017 09/26/17   Epifanio Lesches, MD  prednisoLONE (ORAPRED) 15 MG/5ML solution Take 30.2 mLs (90.6 mg total) by mouth daily. Patient not taking: Reported on 09/28/2017 09/26/17 09/26/18  Epifanio Lesches, MD    No family history on file.   Social History   Tobacco Use  . Smoking status: Current Some Day Smoker    Types: Cigarettes  . Smokeless tobacco: Never Used  Substance Use Topics  . Alcohol use: Yes  . Drug use: Yes    Types: Marijuana, Cocaine    Allergies as of 09/28/2017  . (No Known Allergies)    Review of Systems:    All systems reviewed and negative except where noted in HPI.   Physical Exam:  Vital signs in last 24 hours: Temp:  [98 F (36.7 C)-99.1 F (37.3 C)] 98.7 F (37.1 C) (01/24 1629) Pulse Rate:  [74-120] 94 (01/24 1629) Resp:  [16-19] 19 (01/24 1330) BP: (93-123)/(55-72) 111/71 (01/24 1629) SpO2:  [97 %-100 %] 99 % (01/24  1629) Weight:  [200 lb (90.7 kg)] 200 lb (90.7 kg) (01/24 0704)   General:   Pleasant, cooperative in NAD Head:  Normocephalic and atraumatic. Eyes:   No icterus.   Conjunctiva pink. PERRLA. Ears:  Normal auditory acuity. Neck:  Supple; no masses or thyroidomegaly Lungs: Respirations even and unlabored. Lungs clear to auscultation bilaterally.   No wheezes, crackles, or rhonchi.  Heart:  Regular rate and rhythm;  Without murmur, clicks, rubs or gallops Abdomen:  Soft, nondistended, nontender. Normal bowel sounds. No appreciable  masses or hepatomegaly.  No rebound or guarding.  Neurologic:  Alert and oriented x3;  grossly normal neurologically. Skin:  Bruises over his arms  Cervical Nodes:  No significant cervical adenopathy. Psych:  Alert and cooperative. Normal affect.  LAB RESULTS: Recent Labs    09/28/17 0706  WBC 5.2  HGB 11.1*  HCT 32.6*  PLT 48*   BMET Recent Labs    09/25/17 2050 09/26/17 0334 09/28/17 0706  NA  --  131* 134*  K 4.1 3.5 3.0*  CL  --  98* 97*  CO2  --  25 28  GLUCOSE  --  120* 137*  BUN  --  8 8  CREATININE  --  0.68 0.46*  CALCIUM  --  8.2* 8.7*   LFT Recent Labs    09/28/17 0706  PROT 7.4  ALBUMIN 3.2*  AST 162*  ALT 41  ALKPHOS 248*  BILITOT 11.7*   PT/INR Recent Labs    09/28/17 0706  LABPROT 16.6*  INR 1.35    STUDIES: Dg Abd 2 Views  Result Date: 09/28/2017 CLINICAL DATA:  Right upper quadrant abdominal pain, bilateral flank pain, nausea and vomiting. EXAM: ABDOMEN - 2 VIEW COMPARISON:  CT of the abdomen and pelvis on 09/23/2017 FINDINGS: No evidence of bowel obstruction, ileus or free air. No abnormal calcifications. Bony structures are unremarkable. No visualized soft tissue abnormalities. IMPRESSION: No acute findings. Electronically Signed   By: Aletta Edouard M.D.   On: 09/28/2017 07:43   US Abdomen Limited Ruq  Result Date: 09/28/2017 CLINICAL DATA:  Abdominal pain, history of cirrhosis EXAM: ULTRASOUND ABDOMEN LIMITED RIGHT UPPER QUADRANT COMPARISON:  CT 09/23/2017 FINDINGS: Gallbladder: Sludge and gallstones noted within the gallbladder. Largest gallstone 8 mm. There is borderline wall thickening at 3 mm. Small amount of pericholecystic fluid noted. Negative sonographic Murphy's sign. Common bile duct: Diameter: Normal caliber, 4 mm Liver: Markedly heterogeneous echotexture throughout the liver. Subtle nodularity of the contours. Findings compatible with cirrhosis. Portal vein is patent with direction of flow toward the liver. Portal vein however  is prominent/enlarged. There appears to be a recanalized umbilical vein. Findings compatible with portal venous hypertension. IMPRESSION: Sludge and gallstones noted within the gallbladder. Gallbladder wall thickening and pericholecystic fluid noted without sonographic Murphy sign. These findings could be related to cholecystitis or intrinsic liver disease. Recommend clinical correlation. Changes of cirrhosis within the liver with portal venous hypertension. Electronically Signed   By: Rolm Baptise M.D.   On: 09/28/2017 11:11      Impression / Plan:   Derrick Blanchard is a 37 y.o. y/o male with acute onset RUQ pain , elevated LFT's , RUQ USG shows gall stones and pericholecystic fluid. He has liver cirrhosis with portal hypertension.   Abnormal LFT's can be from effects of alcohol or from passage of gall stone or from cholecystitis.   CBD not dilated on USG.   Plan  1. Follow LFT's, check AFP, hepB,C,HIV serology.  2.  Get HIDA scan if negtative will need MRCP due to history of abdominal pain and elevated bilirubin to r/o CBD stone although my suspicion is low. Very likely his abnormal LFT's are related to his alcohol intake.  3. IV vitamins 4. Watch for withdrawal] 5. Abnormal area seen on last CT scan will be better seen on MRI to r/o hepatoma.  6. Stop all alcohol use  Thank you for involving me in the care of this patient.      LOS: 0 days   Derrick Bellows, MD  09/28/2017, 6:26 PM

## 2017-09-28 NOTE — ED Notes (Signed)
Report received from [place] - pt is awaiting ultrasound results.

## 2017-09-28 NOTE — ED Triage Notes (Signed)
Pt to ED via EMS from home with c/o CP and nausea and vomiting. PT was recently admitted, hx of cirrhosis. PT c/o CP, A&Ox4

## 2017-09-28 NOTE — ED Notes (Signed)
Spoke with nuclear med regarding hida scan. Pt needs to be npo and no narcotics after midnight for 730am test. Admitting Dr. made aware.

## 2017-09-28 NOTE — ED Notes (Signed)
Dr is awaiting call back from surgery regarding pt's ultrasound results

## 2017-09-29 ENCOUNTER — Inpatient Hospital Stay: Payer: Medicaid Other

## 2017-09-29 ENCOUNTER — Other Ambulatory Visit: Payer: Self-pay

## 2017-09-29 LAB — CBC
HCT: 27.8 % — ABNORMAL LOW (ref 40.0–52.0)
HEMOGLOBIN: 9.4 g/dL — AB (ref 13.0–18.0)
MCH: 31.3 pg (ref 26.0–34.0)
MCHC: 33.7 g/dL (ref 32.0–36.0)
MCV: 93 fL (ref 80.0–100.0)
Platelets: 40 10*3/uL — ABNORMAL LOW (ref 150–440)
RBC: 2.99 MIL/uL — ABNORMAL LOW (ref 4.40–5.90)
RDW: 21.3 % — AB (ref 11.5–14.5)
WBC: 2.8 10*3/uL — ABNORMAL LOW (ref 3.8–10.6)

## 2017-09-29 LAB — COMPREHENSIVE METABOLIC PANEL
ALBUMIN: 2.7 g/dL — AB (ref 3.5–5.0)
ALK PHOS: 174 U/L — AB (ref 38–126)
ALT: 34 U/L (ref 17–63)
ANION GAP: 8 (ref 5–15)
AST: 128 U/L — ABNORMAL HIGH (ref 15–41)
BUN: 7 mg/dL (ref 6–20)
CALCIUM: 7.4 mg/dL — AB (ref 8.9–10.3)
CO2: 22 mmol/L (ref 22–32)
Chloride: 105 mmol/L (ref 101–111)
Creatinine, Ser: 0.5 mg/dL — ABNORMAL LOW (ref 0.61–1.24)
GLUCOSE: 95 mg/dL (ref 65–99)
POTASSIUM: 3.4 mmol/L — AB (ref 3.5–5.1)
Sodium: 135 mmol/L (ref 135–145)
TOTAL PROTEIN: 5.8 g/dL — AB (ref 6.5–8.1)
Total Bilirubin: 11.6 mg/dL — ABNORMAL HIGH (ref 0.3–1.2)

## 2017-09-29 MED ORDER — MORPHINE SULFATE (PF) 4 MG/ML IV SOLN
INTRAVENOUS | Status: AC
  Filled 2017-09-29: qty 1

## 2017-09-29 MED ORDER — NICOTINE 14 MG/24HR TD PT24: 14.0000 mg | MEDICATED_PATCH | Freq: Every day | TRANSDERMAL | Status: DC

## 2017-09-29 MED ORDER — TECHNETIUM TC 99M MEBROFENIN IV KIT: 5.0000 | PACK | Freq: Once | INTRAVENOUS | Status: DC | PRN

## 2017-09-29 MED ORDER — MORPHINE SULFATE (PF) 4 MG/ML IV SOLN
3.6000 mg | Freq: Once | INTRAVENOUS | Status: AC
  Administered 2017-09-29: 3.6 mg via INTRAVENOUS
  Filled 2017-09-29: qty 1

## 2017-09-29 MED ORDER — SODIUM CHLORIDE 0.9 % IV SOLN
1.0000 g | Freq: Once | INTRAVENOUS | Status: AC
  Administered 2017-09-29: 1 g via INTRAVENOUS
  Filled 2017-09-29: qty 10

## 2017-09-29 MED ORDER — TECHNETIUM TC 99M MEBROFENIN IV KIT
10.0000 | PACK | Freq: Once | INTRAVENOUS | Status: AC | PRN
  Administered 2017-09-29: 10.25 via INTRAVENOUS

## 2017-09-29 MED ORDER — MORPHINE SULFATE (PF) 4 MG/ML IV SOLN
4.0000 mg | INTRAVENOUS | Status: DC | PRN
  Administered 2017-09-29: 4 mg via INTRAVENOUS
  Filled 2017-09-29: qty 1

## 2017-09-29 NOTE — Progress Notes (Signed)
Patient going to radiology. Verbal orders for morphine 3.6 mg Iv once received from MD Laveda AbbeHu Jeff. Order placed

## 2017-09-29 NOTE — Progress Notes (Signed)
Derrick MoodKiran Natallie Blanchard , MD [address], [address], [place], [place], [id number] [address], [place], [place], [address] Phone: [phone]  Fax: [phone]   Derrick Blanchard is being followed for abnormal LFT's  Day 1 of follow up   Subjective: Still has RUQ pain    Objective: Vital signs in last 24 hours: Vitals:   09/28/17 1629 09/28/17 2157 09/29/17 0357 09/29/17 0752  BP: 111/71 114/62 (!) 104/57 110/67  Pulse: 94 (!) 103 84 80  Resp:  19 19 20   Temp: 98.7 F (37.1 C) 99 F (37.2 C) 98.4 F (36.9 C) 98.4 F (36.9 C)  TempSrc: Oral Oral Oral Oral  SpO2: 99% 98% 94% 100%  Weight:      Height:       Weight change:   Intake/Output Summary (Last 24 hours) at 09/29/2017 1556 Last data filed at 09/29/2017 [id number] Gross per 24 hour  Intake 2646.25 ml  Output -  Net 2646.25 ml     Exam: Heart:: Regular rate and rhythm, S1S2 present or without murmur or extra heart sounds Lungs: normal, clear to auscultation and clear to auscultation and percussion Abdomen: soft, nontender, normal bowel sounds   Lab Results: @LABTEST2 @ Micro Results: No results found for this or any previous visit (from the past 240 hour(s)). Studies/Results: Nm Hepatobiliary Liver Func  Result Date: 09/29/2017 CLINICAL DATA:  37 year old male with right upper quadrant abdominal pain and nausea. Recent ultrasound demonstrating cholelithiasis and mild gallbladder wall thickening. Patient has a history of cirrhosis and hepatic dysfunction. EXAM: NUCLEAR MEDICINE HEPATOBILIARY IMAGING TECHNIQUE: Sequential images of the abdomen were obtained out to 60 minutes following intravenous administration of radiopharmaceutical. Delayed images were obtained following the administration of 3.6 mg of IV morphine. RADIOPHARMACEUTICALS:  10.25 mCi Tc-3854m  Choletec IV COMPARISON:  09/28/2017 ultrasound and CT FINDINGS: Slow and persistent hepatic uptake identified without focal abnormality. Gallbladder and bowel  activity are not identified in the first hour. Intravenous morphine was then administered. On post morphine images, faint increasing gallbladder activity is noted. Small bowel activity is also identified. IMPRESSION: 1. Gallbladder activity identified after administration of morphine, compatible with cystic duct patency. No evidence of acute cholecystitis or CBD obstruction. 2. Slow and persistent hepatic uptake compatible with hepatic dysfunction. Electronically Signed   By: Harmon PierJeffrey  Hu M.D.   On: 09/29/2017 12:51   Dg Abd 2 Views  Result Date: 09/28/2017 CLINICAL DATA:  Right upper quadrant abdominal pain, bilateral flank pain, nausea and vomiting. EXAM: ABDOMEN - 2 VIEW COMPARISON:  CT of the abdomen and pelvis on 09/23/2017 FINDINGS: No evidence of bowel obstruction, ileus or free air. No abnormal calcifications. Bony structures are unremarkable. No visualized soft tissue abnormalities. IMPRESSION: No acute findings. Electronically Signed   By: Irish LackGlenn  Yamagata M.D.   On: 09/28/2017 07:43   [place] Abdomen Limited Ruq  Result Date: 09/28/2017 CLINICAL DATA:  Abdominal pain, history of cirrhosis EXAM: ULTRASOUND ABDOMEN LIMITED RIGHT UPPER QUADRANT COMPARISON:  CT 09/23/2017 FINDINGS: Gallbladder: Sludge and gallstones noted within the gallbladder. Largest gallstone 8 mm. There is borderline wall thickening at 3 mm. Small amount of pericholecystic fluid noted. Negative sonographic Murphy's sign. Common bile duct: Diameter: Normal caliber, 4 mm Liver: Markedly heterogeneous echotexture throughout the liver. Subtle nodularity of the contours. Findings compatible with cirrhosis. Portal vein is patent with direction of flow toward the liver. Portal vein however is prominent/enlarged. There appears to be a recanalized umbilical vein. Findings compatible with portal venous hypertension. IMPRESSION: Sludge and  gallstones noted within the gallbladder. Gallbladder wall thickening and pericholecystic fluid noted without  sonographic Murphy sign. These findings could be related to cholecystitis or intrinsic liver disease. Recommend clinical correlation. Changes of cirrhosis within the liver with portal venous hypertension. Electronically Signed   By: Charlett Nose M.D.   On: 09/28/2017 11:11   Medications: I have reviewed the patient's current medications. Scheduled Meds: . folic acid  1 mg Oral Daily  . LORazepam  0-4 mg Oral Q6H   Followed by  . [START ON 09/30/2017] LORazepam  0-4 mg Oral Q12H  . LORazepam  0.5 mg Oral Q8H  . multivitamin with minerals  1 tablet Oral Daily  . nadolol  20 mg Oral Daily  . nicotine  14 mg Transdermal Daily  . pantoprazole  40 mg Oral Daily  . potassium chloride  20 mEq Oral Daily  . prednisoLONE  1 mg/kg Oral Daily  . thiamine  100 mg Oral Daily   Or  . thiamine  100 mg Intravenous Daily   Continuous Infusions: . sodium chloride 75 mL/hr at 09/28/17 2323  . calcium gluconate     PRN Meds:.LORazepam **OR** LORazepam, morphine injection, ondansetron **OR** ondansetron (ZOFRAN) IV, oxyCODONE, polyethylene glycol   Assessment: Active Problems:   Elevated liver enzymes   Hyperbilirubinemia   Derrick Blanchard is a 37 y.o. y/o male with acute onset RUQ pain , elevated LFT's , RUQ USG shows gall stones and pericholecystic fluid. He has liver cirrhosis with portal hypertension. HIDA scan negative for cholecystitis. Abnormal; appearing area seen on last CT scan which was not previously followed up/ .    Plan  1. Follow LFT's, check AFP, hepB,C,HIV serology.  2. F/u MRCP due to history of abdominal pain and elevated bilirubin to r/o CBD stone although my suspicion is low. Very likely his abnormal LFT's are related to his alcohol intake.  3. IV vitamins 4. Watch for withdrawal 5. Abnormal area seen on last CT scan will be better seen on MRI to r/o hepatoma.  6. Stop all alcohol use     LOS: 1 day   Derrick Mood, MD 09/29/2017, 3:56 PM

## 2017-09-29 NOTE — Progress Notes (Signed)
Pt anxious and wanting to eat. Upset that he is not able to eat at this time. MD on call paged and waiting on call back. Pt states that he does not want to wait for the MD to call back. He has called his cousin and wants to go home. This nurse explained the importance of staying and completeing treatment. Pt understands but still want to leave. AMA form completed and placed on chart. This nurse explained that this is not considered a discharge it is the pt wanting to leave against medical advice. Pt medication retreved from pharmacy and handed back to pt. Iv removed.

## 2017-09-29 NOTE — Progress Notes (Signed)
Pt cousin picked pt up for a ride home.

## 2017-09-29 NOTE — Progress Notes (Signed)
[facility] - [place] at [facility]   PATIENT NAME: Derrick Blanchard    MR#:  [id number]  DATE OF BIRTH:  1980-12-30  SUBJECTIVE:  CHIEF COMPLAINT:   Chief Complaint  Patient presents with  . Emesis  . Chest Pain  Patient continues to complain of right upper quadrant abdominal pain for which she has had for 1-3 years, results of HIDA scan shared with the patient, and agrees to have MRCP done for further evaluation, continue morphine for severe pain, gastroenterology/general surgery input greatly appreciated  REVIEW OF SYSTEMS:  CONSTITUTIONAL: No fever, fatigue or weakness.  EYES: No blurred or double vision.  EARS, NOSE, AND THROAT: No tinnitus or ear pain.  RESPIRATORY: No cough, shortness of breath, wheezing or hemoptysis.  CARDIOVASCULAR: No chest pain, orthopnea, edema.  GASTROINTESTINAL: No nausea, vomiting, diarrhea or abdominal pain.  GENITOURINARY: No dysuria, hematuria.  ENDOCRINE: No polyuria, nocturia,  HEMATOLOGY: No anemia, easy bruising or bleeding SKIN: No rash or lesion. MUSCULOSKELETAL: No joint pain or arthritis.   NEUROLOGIC: No tingling, numbness, weakness.  PSYCHIATRY: No anxiety or depression.   ROS  DRUG ALLERGIES:  No Known Allergies  VITALS:  Blood pressure 110/67, pulse 80, temperature 98.4 F (36.9 C), temperature source Oral, resp. rate 20, height 5\' 8"  (1.727 m), weight 90.7 kg (200 lb), SpO2 100 %.  PHYSICAL EXAMINATION:  GENERAL:  37 y.o.-year-old patient lying in the bed with no acute distress.  EYES: Pupils equal, round, reactive to light and accommodation. No scleral icterus. Extraocular muscles intact.  HEENT: Head atraumatic, normocephalic. Oropharynx and nasopharynx clear.  NECK:  Supple, no jugular venous distention. No thyroid enlargement, no tenderness.  LUNGS: Normal breath sounds bilaterally, no wheezing, rales,rhonchi or crepitation. No use of accessory muscles of respiration.  CARDIOVASCULAR: S1, S2 normal. No  murmurs, rubs, or gallops.  ABDOMEN: Soft, nontender, nondistended. Bowel sounds present. No organomegaly or mass.  EXTREMITIES: No pedal edema, cyanosis, or clubbing.  NEUROLOGIC: Cranial nerves II through XII are intact. Muscle strength 5/5 in all extremities. Sensation intact. Gait not checked.  PSYCHIATRIC: The patient is alert and oriented x 3.  SKIN: No obvious rash, lesion, or ulcer.   Physical Exam LABORATORY PANEL:   CBC Recent Labs  Lab 09/29/17 0311  WBC 2.8*  HGB 9.4*  HCT 27.8*  PLT 40*   ------------------------------------------------------------------------------------------------------------------  Chemistries  Recent Labs  Lab 09/26/17 0334  09/29/17 0311  NA 131*   < > 135  K 3.5   < > 3.4*  CL 98*   < > 105  CO2 25   < > 22  GLUCOSE 120*   < > 95  BUN 8   < > 7  CREATININE 0.68   < > 0.50*  CALCIUM 8.2*   < > 7.4*  MG 2.0  --   --   AST  --    < > 128*  ALT  --    < > 34  ALKPHOS  --    < > 174*  BILITOT  --    < > 11.6*   < > = values in this interval not displayed.   ------------------------------------------------------------------------------------------------------------------  Cardiac Enzymes Recent Labs  Lab 09/24/17 0803 09/24/17 1411  TROPONINI <0.03 <0.03   ------------------------------------------------------------------------------------------------------------------  RADIOLOGY:  Nm Hepatobiliary Liver Func  Result Date: 09/29/2017 CLINICAL DATA:  37 year old male with right upper quadrant abdominal pain and nausea. Recent ultrasound demonstrating cholelithiasis and mild gallbladder wall thickening. Patient has a history of cirrhosis and hepatic  dysfunction. EXAM: NUCLEAR MEDICINE HEPATOBILIARY IMAGING TECHNIQUE: Sequential images of the abdomen were obtained out to 60 minutes following intravenous administration of radiopharmaceutical. Delayed images were obtained following the administration of 3.6 mg of IV morphine.  RADIOPHARMACEUTICALS:  10.25 mCi Tc-3865m  Choletec IV COMPARISON:  09/28/2017 ultrasound and CT FINDINGS: Slow and persistent hepatic uptake identified without focal abnormality. Gallbladder and bowel activity are not identified in the first hour. Intravenous morphine was then administered. On post morphine images, faint increasing gallbladder activity is noted. Small bowel activity is also identified. IMPRESSION: 1. Gallbladder activity identified after administration of morphine, compatible with cystic duct patency. No evidence of acute cholecystitis or CBD obstruction. 2. Slow and persistent hepatic uptake compatible with hepatic dysfunction. Electronically Signed   By: Harmon PierJeffrey  Hu M.D.   On: 09/29/2017 12:51   Dg Abd 2 Views  Result Date: 09/28/2017 CLINICAL DATA:  Right upper quadrant abdominal pain, bilateral flank pain, nausea and vomiting. EXAM: ABDOMEN - 2 VIEW COMPARISON:  CT of the abdomen and pelvis on 09/23/2017 FINDINGS: No evidence of bowel obstruction, ileus or free air. No abnormal calcifications. Bony structures are unremarkable. No visualized soft tissue abnormalities. IMPRESSION: No acute findings. Electronically Signed   By: Irish LackGlenn  Yamagata M.D.   On: 09/28/2017 07:43   [place] Abdomen Limited Ruq  Result Date: 09/28/2017 CLINICAL DATA:  Abdominal pain, history of cirrhosis EXAM: ULTRASOUND ABDOMEN LIMITED RIGHT UPPER QUADRANT COMPARISON:  CT 09/23/2017 FINDINGS: Gallbladder: Sludge and gallstones noted within the gallbladder. Largest gallstone 8 mm. There is borderline wall thickening at 3 mm. Small amount of pericholecystic fluid noted. Negative sonographic Murphy's sign. Common bile duct: Diameter: Normal caliber, 4 mm Liver: Markedly heterogeneous echotexture throughout the liver. Subtle nodularity of the contours. Findings compatible with cirrhosis. Portal vein is patent with direction of flow toward the liver. Portal vein however is prominent/enlarged. There appears to be a recanalized  umbilical vein. Findings compatible with portal venous hypertension. IMPRESSION: Sludge and gallstones noted within the gallbladder. Gallbladder wall thickening and pericholecystic fluid noted without sonographic Murphy sign. These findings could be related to cholecystitis or intrinsic liver disease. Recommend clinical correlation. Changes of cirrhosis within the liver with portal venous hypertension. Electronically Signed   By: Charlett NoseKevin  Dover M.D.   On: 09/28/2017 11:11    ASSESSMENT AND PLAN:  37 year old male with EtOH related liver cirrhosis who presents with right upper quadrant abdominal pain and nausea.  1 acute on chronic right upper quadrant abdominal pain Has had for 1-3 years due to cirrhosis with associated with nausea, jaundice, chronic thrombocytopenia, portal venous hypertension with varices Most likely secondary to chronic cirrhosis U/S shown sludge/gallstones/? acute cholecystitis/neg sonographic Murphy sign, HIDA scan done September 29, 2017 noted for hepatic dysfunction/no evidence for acute cholecystitis  General surgery did see patient while in house-no surgical intervention indicated at this time/high risk for operative intervention if ever indicated  Gastroenterology input appreciated, for MRCP for further evaluation, continue n.p.o. 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 potassium   4 chronic alcoholism  Cessation counseling given  Continue alcohol withdrawal protocol   5 chronic tobacco smoking abuse/dependency  Cessation counseling given, nicotine patch daily   6 acute gastritis  PPI daily   7 history of polydrug abuse- EtOH, marijuana,  cocaine  and tobacco dependence Cessation counseling given while in house  All the records are reviewed and case discussed with Care Management/Social Workerr. Management plans discussed with the patient, family and they are in agreement.  CODE STATUS: full TOTAL TIME TAKING CARE OF THIS PATIENT: 35 minutes.     POSSIBLE D/C IN 1-3 DAYS, DEPENDING ON CLINICAL CONDITION.   Evelena Asa Salary M.D on 09/29/2017   Between 7am to 6pm - Pager - [phone]  After 6pm go to [email or web address] - password [email or web address]  [facility] [place] [facility]  Office  [phone]  CC: Primary care physician; Patient, No Pcp Per  Note: This dictation was prepared with Dragon dictation along with smaller phrase technology. Any transcriptional errors that result from this process are unintentional.

## 2017-09-30 LAB — HEPATITIS B CORE ANTIBODY, TOTAL: HEP B C TOTAL AB: NEGATIVE

## 2017-09-30 LAB — HEPATITIS A ANTIBODY, IGM: HEP A IGM: NEGATIVE

## 2017-09-30 LAB — HEPATITIS B DNA, ULTRAQUANTITATIVE, PCR
HBV DNA SERPL PCR-ACNC: NOT DETECTED IU/mL
HBV DNA SERPL PCR-LOG IU: UNDETERMINED log10 IU/mL

## 2017-09-30 LAB — HIV ANTIBODY (ROUTINE TESTING W REFLEX): HIV SCREEN 4TH GENERATION: NONREACTIVE

## 2017-09-30 LAB — AFP TUMOR MARKER: AFP, Serum, Tumor Marker: 4.2 ng/mL (ref 0.0–8.3)

## 2017-09-30 LAB — HEPATITIS B SURFACE ANTIGEN: Hepatitis B Surface Ag: NEGATIVE

## 2017-09-30 LAB — HEPATITIS C ANTIBODY

## 2017-10-02 ENCOUNTER — Other Ambulatory Visit: Payer: Self-pay

## 2017-10-02 DIAGNOSIS — B1710 Acute hepatitis C without hepatic coma: Secondary | ICD-10-CM

## 2017-10-02 LAB — HEPATITIS C VRS RNA DETECT BY PCR-QUAL: Hepatitis C Vrs RNA by PCR-Qual: POSITIVE — AB

## 2017-10-02 NOTE — Progress Notes (Addendum)
LVM for patient callback for results per Dr. Tobi BastosAnna.    -  See prior message- pcr confirms he has hepatitis C, he needs to follow up , send letter    Sent letter to patient with result information.  Ordered 2 labs Viral load & Genotype.

## 2017-10-05 NOTE — Discharge Summary (Signed)
[facility] - [place] at [facility]   PATIENT NAME: Derrick Blanchard    MR#:  [id number]  DATE OF BIRTH:  [birth year]  DATE OF ADMISSION:  09/28/2017 ADMITTING PHYSICIAN: Adrian Saran, MD  DATE OF DISCHARGE: 09/29/2017  8:41 PM  PRIMARY CARE PHYSICIAN: Patient, No Pcp Per    ADMISSION DIAGNOSIS:  Hyperbilirubinemia [E80.6] Gallstones [K80.20] Abdominal pain [R10.9] Non-intractable vomiting with nausea, unspecified vomiting type [R11.2]  DISCHARGE DIAGNOSIS:  Active Problems:   Elevated liver enzymes   Hyperbilirubinemia   SECONDARY DIAGNOSIS:   Past Medical History:  Diagnosis Date  . Cirrhosis (HCC)   . Hepatitis     HOSPITAL COURSE:  Patient left hospital AGAINST MEDICAL ADVICE 37 year old male with EtOH related liver cirrhosis who presents with right upper quadrant abdominal pain and nausea.  1 acute on chronic right upper quadrant abdominal pain Has had for 1-3 years due to cirrhosis with associated with nausea, jaundice, chronic thrombocytopenia, portal venous hypertension with varices Most likely secondary to chronic cirrhosis U/S shown sludge/gallstones/? acute cholecystitis/neg sonographic Murphy sign, HIDA scan done September 29, 2017 noted for hepatic dysfunction/no evidence for acute cholecystitis  General surgery did see patient while in house-no surgical intervention indicated at this time/high risk for operative intervention if ever indicated  Gastroenterology input appreciated, for MRCP for further evaluation, continue n.p.o. 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 potassium   4 chronic alcoholism  Cessation  counseling given  Continue alcohol withdrawal protocol   5 chronic tobacco smoking abuse/dependency  Cessation counseling given, nicotine patch daily   6 acute gastritis  PPI daily   7 history of polydrug abuse- EtOH, marijuana, cocaine and tobacco dependence Cessation counseling given while in house    DISCHARGE CONDITIONS:   Patient left AMA CONSULTS OBTAINED:  Treatment Team:  Wyline Mood, MD  DRUG ALLERGIES:  No Known Allergies  DISCHARGE MEDICATIONS:   Allergies as of 09/29/2017   No Known Allergies     Medication List    ASK your doctor about these medications   LORazepam 0.5 MG tablet Commonly known as:  ATIVAN Take 1 tablet (0.5 mg total) by mouth every 8 (eight) hours.   metoprolol tartrate 25 MG tablet Commonly known as:  LOPRESSOR Take 0.5 tablets (12.5 mg total) by mouth 2 (two) times daily.   pantoprazole 40 MG tablet Commonly known as:  PROTONIX Take 1 tablet (40 mg total) by mouth daily.   potassium chloride 20 MEQ packet Commonly known as:  KLOR-CON Take 20 mEq by mouth daily.   prednisoLONE 15 MG/5ML solution Commonly known as:  ORAPRED Take 30.2 mLs (90.6 mg total) by mouth daily.        DISCHARGE INSTRUCTIONS:    If you experience worsening of your admission symptoms, develop shortness of breath, life threatening emergency, suicidal or homicidal thoughts you must seek medical attention immediately by calling 911 or calling your MD immediately  if symptoms less severe.  You Must read complete instructions/literature along with all the possible adverse reactions/side effects for all the Medicines you take and that have been prescribed to you. Take any new Medicines after you have completely understood and accept all the possible adverse reactions/side effects.   Please note  You were cared for by  a hospitalist during your hospital stay. If you have any questions about your discharge medications or the care you received while you  were in the hospital after you are discharged, you can call the unit and asked to speak with the hospitalist on call if the hospitalist that took care of you is not available. Once you are discharged, your primary care physician will handle any further medical issues. Please note that NO REFILLS for any discharge medications will be authorized once you are discharged, as it is imperative that you return to your primary care physician (or establish a relationship with a primary care physician if you do not have one) for your aftercare needs so that they can reassess your need for medications and monitor your lab values.    Today   CHIEF COMPLAINT:   Chief Complaint  Patient presents with  . Emesis  . Chest Pain    HISTORY OF PRESENT ILLNESS:  37 y.o. male with a known history of EtOH related liver cirrhosis with portal hypertension who was discharged 2 days ago with alcohol hepatitis who presents today with right upper quadrant abdominal pain and nausea. Patient reports since this morning he's had right upper quadrant abdominal pain and nausea. He does states that he feels he could drink something now. He denies fever or chills. He was discharged with medications however has not taken any of the medications. His last drink was yesterday. He has not used cocaine however does endorse that he smoked marijuana yesterday. ER physician spoke with GI and surgery as the patient is found to have slightly elevated bilirubin from discharge. His discharge bilirubin was 10 and now it is 11. Right upper quadrant ultrasound does not show evidence of acute cholecystitis.      VITAL SIGNS:  Blood pressure (!) 106/54, pulse 77, temperature 98.2 F (36.8 C), temperature source Oral, resp. rate 18, height 5\' 8"  (1.727 m), weight 90.7 kg (200 lb), SpO2 98 %.  I/O:  No intake or output data in the 24 hours ending 10/05/17 1515  PHYSICAL EXAMINATION:  GENERAL:  37 y.o.-year-old patient lying in the bed with  no acute distress.  EYES: Pupils equal, round, reactive to light and accommodation. No scleral icterus. Extraocular muscles intact.  HEENT: Head atraumatic, normocephalic. Oropharynx and nasopharynx clear.  NECK:  Supple, no jugular venous distention. No thyroid enlargement, no tenderness.  LUNGS: Normal breath sounds bilaterally, no wheezing, rales,rhonchi or crepitation. No use of accessory muscles of respiration.  CARDIOVASCULAR: S1, S2 normal. No murmurs, rubs, or gallops.  ABDOMEN: Soft, non-tender, non-distended. Bowel sounds present. No organomegaly or mass.  EXTREMITIES: No pedal edema, cyanosis, or clubbing.  NEUROLOGIC: Cranial nerves II through XII are intact. Muscle strength 5/5 in all extremities. Sensation intact. Gait not checked.  PSYCHIATRIC: The patient is alert and oriented x 3.  SKIN: No obvious rash, lesion, or ulcer.   DATA REVIEW:   CBC Recent Labs  Lab 09/29/17 0311  WBC 2.8*  HGB 9.4*  HCT 27.8*  PLT 40*    Chemistries  Recent Labs  Lab 09/29/17 0311  NA 135  K 3.4*  CL 105  CO2 22  GLUCOSE 95  BUN 7  CREATININE 0.50*  CALCIUM 7.4*  AST 128*  ALT 34  BLOOD  ALKPHOS 174*  BILITOT 11.6*    Cardiac Enzymes No results for input(s): TROPONINI in the last 168 hours.  Microbiology Results  Results for orders placed or performed during the hospital encounter of 01/30/15  MRSA  PCR Screening     Status: Abnormal   Collection Time: 01/31/15  9:45 AM  Result Value Ref Range Status   MRSA by PCR POSITIVE (A) NEGATIVE Final    Comment:        The GeneXpert MRSA Assay (FDA approved for NASAL specimens only), is one component of a comprehensive MRSA colonization surveillance program. It is not intended to diagnose MRSA infection nor to guide or monitor treatment for MRSA infections. CRITICAL RESULT CALLED TO, READ BACK BY AND VERIFIED WITH: ERIN MAYNOR ON 01/31/15 AT 1137 BY QSD     RADIOLOGY:  No results found.  EKG:   Orders placed or  performed during the hospital encounter of 09/28/17  . EKG 12-Lead  . EKG 12-Lead      Management plans discussed with the patient, family and they are in agreement.  CODE STATUS:  Code Status History    Date Active Date Inactive Code Status Order ID Comments User Context   09/28/2017 19:56 09/29/2017 23:46 Full Code [id number]  Adrian Saran, MD Inpatient   09/24/2017 02:44 09/26/2017 21:03 Full Code [id number]  Cammy Copa, MD Inpatient   01/31/2015 01:59 02/07/2015 17:39 Full Code [id number]  Crissie Figures, MD Inpatient      TOTAL TIME TAKING CARE OF THIS PATIENT: 45 minutes.    Evelena Asa Navayah Sok M.D on 10/05/2017 at 3:15 PM  Between 7am to 6pm - Pager - [phone]  After 6pm go to [email or web address] - password [email or web address]  [facility] [place] [facility]  Office  [phone]  CC: Primary care physician; Patient, No Pcp Per   Note: This dictation was prepared with Dragon dictation along with smaller phrase technology. Any transcriptional errors that result from this process are unintentional.

## 2017-10-12 ENCOUNTER — Encounter: Payer: Self-pay | Admitting: Gastroenterology

## 2017-10-12 ENCOUNTER — Ambulatory Visit: Payer: Self-pay | Admitting: Gastroenterology

## 2017-10-13 ENCOUNTER — Inpatient Hospital Stay
Admission: EM | Admit: 2017-10-13 | Discharge: 2017-10-16 | DRG: 433 | Disposition: A | Discharge status: Home / Self Care | Payer: Medicaid Other | Attending: Internal Medicine | Admitting: Internal Medicine

## 2017-10-13 DIAGNOSIS — K649 Unspecified hemorrhoids: Secondary | ICD-10-CM | POA: Diagnosis present

## 2017-10-13 DIAGNOSIS — K625 Hemorrhage of anus and rectum: Secondary | ICD-10-CM | POA: Diagnosis present

## 2017-10-13 DIAGNOSIS — K7031 Alcoholic cirrhosis of liver with ascites: Secondary | ICD-10-CM | POA: Diagnosis present

## 2017-10-13 DIAGNOSIS — F10929 Alcohol use, unspecified with intoxication, unspecified: Secondary | ICD-10-CM

## 2017-10-13 DIAGNOSIS — R109 Unspecified abdominal pain: Secondary | ICD-10-CM

## 2017-10-13 DIAGNOSIS — D6959 Other secondary thrombocytopenia: Secondary | ICD-10-CM | POA: Diagnosis present

## 2017-10-13 DIAGNOSIS — F1020 Alcohol dependence, uncomplicated: Secondary | ICD-10-CM | POA: Diagnosis present

## 2017-10-13 DIAGNOSIS — F10229 Alcohol dependence with intoxication, unspecified: Secondary | ICD-10-CM | POA: Diagnosis present

## 2017-10-13 DIAGNOSIS — F10239 Alcohol dependence with withdrawal, unspecified: Secondary | ICD-10-CM | POA: Diagnosis present

## 2017-10-13 DIAGNOSIS — K7460 Unspecified cirrhosis of liver: Secondary | ICD-10-CM

## 2017-10-13 DIAGNOSIS — F1029 Alcohol dependence with unspecified alcohol-induced disorder: Secondary | ICD-10-CM | POA: Diagnosis not present

## 2017-10-13 DIAGNOSIS — R062 Wheezing: Secondary | ICD-10-CM

## 2017-10-13 DIAGNOSIS — F17210 Nicotine dependence, cigarettes, uncomplicated: Secondary | ICD-10-CM | POA: Diagnosis present

## 2017-10-13 DIAGNOSIS — Y908 Blood alcohol level of 240 mg/100 ml or more: Secondary | ICD-10-CM | POA: Diagnosis present

## 2017-10-13 DIAGNOSIS — K7010 Alcoholic hepatitis without ascites: Secondary | ICD-10-CM | POA: Diagnosis present

## 2017-10-13 DIAGNOSIS — K7011 Alcoholic hepatitis with ascites: Principal | ICD-10-CM | POA: Diagnosis present

## 2017-10-13 LAB — CBC WITH DIFFERENTIAL/PLATELET
Basophils Absolute: 0.1 10*3/uL (ref 0–0.1)
Basophils Relative: 1 %
EOS PCT: 0 %
Eosinophils Absolute: 0 10*3/uL (ref 0–0.7)
HEMATOCRIT: 32.5 % — AB (ref 40.0–52.0)
Hemoglobin: 10.9 g/dL — ABNORMAL LOW (ref 13.0–18.0)
LYMPHS ABS: 2 10*3/uL (ref 1.0–3.6)
LYMPHS PCT: 15 %
MCH: 31.4 pg (ref 26.0–34.0)
MCHC: 33.6 g/dL (ref 32.0–36.0)
MCV: 93.7 fL (ref 80.0–100.0)
MONO ABS: 0.9 10*3/uL (ref 0.2–1.0)
MONOS PCT: 7 %
NEUTROS ABS: 10.3 10*3/uL — AB (ref 1.4–6.5)
Neutrophils Relative %: 77 %
PLATELETS: 122 10*3/uL — AB (ref 150–440)
RBC: 3.47 MIL/uL — ABNORMAL LOW (ref 4.40–5.90)
RDW: 20.2 % — AB (ref 11.5–14.5)
WBC: 13.2 10*3/uL — ABNORMAL HIGH (ref 3.8–10.6)

## 2017-10-13 LAB — BASIC METABOLIC PANEL
Anion gap: 9 (ref 5–15)
BUN: <5 mg/dL — ABNORMAL LOW (ref 6–20)
CHLORIDE: 105 mmol/L (ref 101–111)
CO2: 22 mmol/L (ref 22–32)
Calcium: 8 mg/dL — ABNORMAL LOW (ref 8.9–10.3)
Creatinine, Ser: <0.3 mg/dL — ABNORMAL LOW (ref 0.61–1.24)
Glucose, Bld: 121 mg/dL — ABNORMAL HIGH (ref 65–99)
Potassium: 3.8 mmol/L (ref 3.5–5.1)
Sodium: 136 mmol/L (ref 135–145)

## 2017-10-13 LAB — URINE DRUG SCREEN, QUALITATIVE (ARMC ONLY)
Amphetamines, Ur Screen: NOT DETECTED
Barbiturates, Ur Screen: NOT DETECTED
Benzodiazepine, Ur Scrn: NOT DETECTED
Cannabinoid 50 Ng, Ur ~~LOC~~: POSITIVE — AB
Cocaine Metabolite,Ur ~~LOC~~: NOT DETECTED
MDMA (Ecstasy)Ur Screen: NOT DETECTED
Methadone Scn, Ur: NOT DETECTED
Opiate, Ur Screen: POSITIVE — AB
Phencyclidine (PCP) Ur S: NOT DETECTED
Tricyclic, Ur Screen: NOT DETECTED

## 2017-10-13 LAB — URINALYSIS, COMPLETE (UACMP) WITH MICROSCOPIC
Bacteria, UA: NONE SEEN
Glucose, UA: NEGATIVE mg/dL
Hgb urine dipstick: NEGATIVE
Ketones, ur: NEGATIVE mg/dL
Leukocytes, UA: NEGATIVE
Nitrite: NEGATIVE
Protein, ur: NEGATIVE mg/dL
Specific Gravity, Urine: 1.005 (ref 1.005–1.030)
Squamous Epithelial / LPF: NONE SEEN
pH: 6 (ref 5.0–8.0)

## 2017-10-13 LAB — HEPATIC FUNCTION PANEL
ALBUMIN: 2.6 g/dL — AB (ref 3.5–5.0)
ALK PHOS: 158 U/L — AB (ref 38–126)
ALT: 67 U/L — AB (ref 17–63)
AST: 174 U/L — AB (ref 15–41)
BILIRUBIN TOTAL: 16.9 mg/dL — AB (ref 0.3–1.2)
Bilirubin, Direct: 8.5 mg/dL — ABNORMAL HIGH (ref 0.1–0.5)
Indirect Bilirubin: 8.4 mg/dL — ABNORMAL HIGH (ref 0.3–0.9)
TOTAL PROTEIN: 6.9 g/dL (ref 6.5–8.1)

## 2017-10-13 LAB — PROTIME-INR
INR: 1.35
Prothrombin Time: 16.6 seconds — ABNORMAL HIGH (ref 11.4–15.2)

## 2017-10-13 LAB — TSH: TSH: 3.733 u[IU]/mL (ref 0.350–4.500)

## 2017-10-13 LAB — LIPASE, BLOOD: Lipase: 57 U/L — ABNORMAL HIGH (ref 11–51)

## 2017-10-13 LAB — ETHANOL: Alcohol, Ethyl (B): 328 mg/dL (ref <10)

## 2017-10-13 MED ORDER — MORPHINE SULFATE (PF) 2 MG/ML IV SOLN
2.0000 mg | INTRAVENOUS | Status: DC | PRN
  Administered 2017-10-13 (×2): 2 mg via INTRAVENOUS
  Filled 2017-10-13 (×2): qty 1

## 2017-10-13 MED ORDER — HYDROCODONE-ACETAMINOPHEN 5-325 MG PO TABS
1.0000 | ORAL_TABLET | ORAL | Status: DC | PRN
Start: 2017-10-13 — End: 2017-10-15
  Administered 2017-10-13 – 2017-10-15 (×6): 1 via ORAL
  Filled 2017-10-13 (×6): qty 1

## 2017-10-13 MED ORDER — LORAZEPAM 0.5 MG PO TABS
0.5000 mg | ORAL_TABLET | Freq: Three times a day (TID) | ORAL | Status: DC
  Administered 2017-10-13 – 2017-10-16 (×9): 0.5 mg via ORAL
  Filled 2017-10-13 (×10): qty 1

## 2017-10-13 MED ORDER — GI COCKTAIL ~~LOC~~
30.0000 mL | Freq: Once | ORAL | Status: AC
  Administered 2017-10-13: 30 mL via ORAL
  Filled 2017-10-13: qty 30

## 2017-10-13 MED ORDER — METOPROLOL TARTRATE 25 MG PO TABS
12.5000 mg | ORAL_TABLET | Freq: Two times a day (BID) | ORAL | Status: DC
  Administered 2017-10-13 – 2017-10-16 (×7): 12.5 mg via ORAL
  Filled 2017-10-13 (×7): qty 1

## 2017-10-13 MED ORDER — ONDANSETRON HCL 4 MG PO TABS
4.0000 mg | ORAL_TABLET | Freq: Four times a day (QID) | ORAL | Status: DC | PRN
  Filled 2017-10-13: qty 1

## 2017-10-13 MED ORDER — IBUPROFEN 400 MG PO TABS: 400.0000 mg | ORAL_TABLET | Freq: Three times a day (TID) | ORAL | Status: DC | PRN

## 2017-10-13 MED ORDER — HYDROCORTISONE NA SUCCINATE PF 100 MG IJ SOLR
100.0000 mg | Freq: Once | INTRAMUSCULAR | Status: AC
  Administered 2017-10-13: 100 mg via INTRAVENOUS
  Filled 2017-10-13: qty 2

## 2017-10-13 MED ORDER — PNEUMOCOCCAL VAC POLYVALENT 25 MCG/0.5ML IJ INJ
0.5000 mL | INJECTION | INTRAMUSCULAR | Status: DC
  Filled 2017-10-13: qty 0.5

## 2017-10-13 MED ORDER — ONDANSETRON HCL 4 MG/2ML IJ SOLN
4.0000 mg | Freq: Once | INTRAMUSCULAR | Status: AC
  Administered 2017-10-13: 4 mg via INTRAVENOUS
  Filled 2017-10-13: qty 2

## 2017-10-13 MED ORDER — MORPHINE SULFATE (PF) 4 MG/ML IV SOLN
4.0000 mg | Freq: Once | INTRAVENOUS | Status: AC
  Administered 2017-10-13: 4 mg via INTRAVENOUS
  Filled 2017-10-13: qty 1

## 2017-10-13 MED ORDER — ONDANSETRON HCL 4 MG/2ML IJ SOLN
4.0000 mg | Freq: Four times a day (QID) | INTRAMUSCULAR | Status: DC | PRN
  Administered 2017-10-16: 09:00:00 4 mg via INTRAVENOUS
  Filled 2017-10-13: qty 2

## 2017-10-13 MED ORDER — BUSPIRONE HCL 5 MG PO TABS
5.0000 mg | ORAL_TABLET | Freq: Two times a day (BID) | ORAL | Status: DC
  Administered 2017-10-13 – 2017-10-16 (×6): 5 mg via ORAL
  Filled 2017-10-13 (×7): qty 1

## 2017-10-13 MED ORDER — DOCUSATE SODIUM 100 MG PO CAPS
100.0000 mg | ORAL_CAPSULE | Freq: Two times a day (BID) | ORAL | Status: DC
  Administered 2017-10-13 – 2017-10-15 (×5): 100 mg via ORAL
  Filled 2017-10-13 (×6): qty 1

## 2017-10-13 MED ORDER — SODIUM CHLORIDE 0.9 % IV SOLN
INTRAVENOUS | Status: DC
  Administered 2017-10-13 – 2017-10-14 (×4): via INTRAVENOUS

## 2017-10-13 MED ORDER — INFLUENZA VAC SPLIT QUAD 0.5 ML IM SUSY
0.5000 mL | PREFILLED_SYRINGE | INTRAMUSCULAR | Status: DC
  Filled 2017-10-13: qty 0.5

## 2017-10-13 MED ORDER — PANTOPRAZOLE SODIUM 40 MG PO TBEC
40.0000 mg | DELAYED_RELEASE_TABLET | Freq: Every day | ORAL | Status: DC
  Administered 2017-10-13 – 2017-10-16 (×4): 40 mg via ORAL
  Filled 2017-10-13 (×4): qty 1

## 2017-10-13 MED ORDER — NALTREXONE HCL 50 MG PO TABS
25.0000 mg | ORAL_TABLET | Freq: Every day | ORAL | Status: DC
  Administered 2017-10-14 – 2017-10-16 (×3): 25 mg via ORAL
  Filled 2017-10-13 (×3): qty 1

## 2017-10-13 NOTE — H&P (Signed)
Derrick Blanchard is an 37 y.o. male.   Chief Complaint: Abdominal pain HPI: Patient with history of cirrhosis and alcohol abuse presents to the emergency department complaining of abdominal pain.  He states that he has had at least 12 beers and 4 shots of liquor today.  He reports abdomen has been hurting more and more over the last few days and that he recently awakens at night with nausea and vomiting.  In the emergency department patient was found to have severely elevated liver enzymes and bilirubin greater than previous levels which prompted the emergency department staff to call the hospitalist service for further management.  Past Medical History:  Diagnosis Date  . Cirrhosis ([place])   . Hepatitis     Past Surgical History:  Procedure Laterality Date  . ESOPHAGOGASTRODUODENOSCOPY (EGD) WITH PROPOFOL N/A 02/03/2015   Procedure: ESOPHAGOGASTRODUODENOSCOPY (EGD) WITH PROPOFOL;  Surgeon: Josefine Class, MD;  Location: [facility] ENDOSCOPY;  Service: Endoscopy;  Laterality: N/A;    Family History  Problem Relation Age of Onset  . Lung cancer Mother   . Cirrhosis Father    Social History:  reports that he has been smoking cigarettes.  he has never used smokeless tobacco. He reports that he drinks alcohol. He reports that he uses drugs. Drugs: Marijuana and Cocaine.  Allergies: No Known Allergies  Prior to Admission medications   Medication Sig Start Date End Date Taking? Authorizing Provider  amoxicillin-clavulanate (AUGMENTIN) 500-125 MG tablet TK 1 T PO TID FOR 7 DAYS 08/03/17  Yes [provider]  LORazepam (ATIVAN) 0.5 MG tablet Take 1 tablet (0.5 mg total) by mouth every 8 (eight) hours. 09/26/17  Yes Epifanio Lesches, MD  metoprolol tartrate (LOPRESSOR) 25 MG tablet Take 0.5 tablets (12.5 mg total) by mouth 2 (two) times daily. 09/26/17 09/26/18 Yes Epifanio Lesches, MD  pantoprazole (PROTONIX) 40 MG tablet Take 1 tablet (40 mg total) by mouth daily. 09/26/17  Yes  Epifanio Lesches, MD  prednisoLONE (ORAPRED) 15 MG/5ML solution Take 30.2 mLs (90.6 mg total) by mouth daily. 09/26/17 09/26/18 Yes Epifanio Lesches, MD  potassium chloride (KLOR-CON) 20 MEQ packet Take 20 mEq by mouth daily. Patient not taking: Reported on 09/28/2017 09/26/17   Epifanio Lesches, MD     Results for orders placed or performed during the hospital encounter of 10/13/17 (from the past 48 hour(s))  Basic metabolic panel     Status: Abnormal   Collection Time: 10/13/17  5:15 AM  Result Value Ref Range   Sodium 136 135 - 145 mmol/L   Potassium 3.8 3.5 - 5.1 mmol/L    Comment: HEMOLYSIS AT THIS LEVEL MAY AFFECT RESULT   Chloride 105 101 - 111 mmol/L   CO2 22 22 - 32 mmol/L   Glucose, Bld 121 (H) 65 - 99 mg/dL   BUN <5 (L) 6 - 20 mg/dL   Creatinine, Ser <0.30 (L) 0.61 - 1.24 mg/dL    Comment: ICTERUS AT THIS LEVEL MAY AFFECT RESULT   Calcium 8.0 (L) 8.9 - 10.3 mg/dL   GFR calc non Af Amer NOT CALCULATED >60 mL/min   GFR calc Af Amer NOT CALCULATED >60 mL/min    Comment: (NOTE) The eGFR has been calculated using the CKD EPI equation. This calculation has not been validated in all clinical situations. eGFR's persistently <60 mL/min signify possible Chronic Kidney Disease.    Anion gap 9 5 - 15    Comment: Performed at [facility], [address], [place], [place] [address]  Hepatic function  panel     Status: Abnormal   Collection Time: 10/13/17  5:15 AM  Result Value Ref Range   Total Protein 6.9 6.5 - 8.1 g/dL   Albumin 2.6 (L) 3.5 - 5.0 g/dL   AST 174 (H) 15 - 41 U/L    Comment: HEMOLYSIS AT THIS LEVEL MAY AFFECT RESULT   ALT 67 (H) 17 - 63 U/L    Comment: ICTERUS AT THIS LEVEL MAY AFFECT RESULT   Alkaline Phosphatase 158 (H) 38 - 126 U/L   Total Bilirubin 16.9 (H) 0.3 - 1.2 mg/dL    Comment: HEMOLYSIS AT THIS LEVEL MAY AFFECT RESULT   Bilirubin, Direct 8.5 (H) 0.1 - 0.5 mg/dL    Comment: HEMOLYSIS AT THIS LEVEL MAY AFFECT RESULT   Indirect  Bilirubin 8.4 (H) 0.3 - 0.9 mg/dL    Comment: Performed at [facility], [address] [place], [place] [address]  Lipase, blood     Status: Abnormal   Collection Time: 10/13/17  5:15 AM  Result Value Ref Range   Lipase 57 (H) 11 - 51 U/L    Comment: Performed at [facility], [address] [place], [place] [address]  CBC with Differential     Status: Abnormal   Collection Time: 10/13/17  5:15 AM  Result Value Ref Range   WBC 13.2 (H) 3.8 - 10.6 K/uL   RBC 3.47 (L) 4.40 - 5.90 MIL/uL   Hemoglobin 10.9 (L) 13.0 - 18.0 g/dL   HCT 32.5 (L) 40.0 - 52.0 %   MCV 93.7 80.0 - 100.0 fL   MCH 31.4 26.0 - 34.0 pg   MCHC 33.6 32.0 - 36.0 g/dL   RDW 20.2 (H) 11.5 - 14.5 %   Platelets 122 (L) 150 - 440 K/uL   Neutrophils Relative % 77 %   Neutro Abs 10.3 (H) 1.4 - 6.5 K/uL   Lymphocytes Relative 15 %   Lymphs Abs 2.0 1.0 - 3.6 K/uL   Monocytes Relative 7 %   Monocytes Absolute 0.9 0.2 - 1.0 K/uL   Eosinophils Relative 0 %   Eosinophils Absolute 0.0 0 - 0.7 K/uL   Basophils Relative 1 %   Basophils Absolute 0.1 0 - 0.1 K/uL    Comment: Performed at [facility], [address] [place], [place] [address]  Protime-INR     Status: Abnormal   Collection Time: 10/13/17  5:15 AM  Result Value Ref Range   Prothrombin Time 16.6 (H) 11.4 - 15.2 seconds   INR 1.35     Comment: Performed at [facility], [address] [place], [place] [address]  Ethanol     Status: Abnormal   Collection Time: 10/13/17  5:15 AM  Result Value Ref Range   Alcohol, Ethyl (B) 328 (HH) <10 mg/dL    Comment: CRITICAL RESULT CALLED TO, READ BACK BY AND VERIFIED WITH JANNA ALLINGTON ON 10/11/17 AT 0609 JAG        LOWEST DETECTABLE LIMIT FOR SERUM ALCOHOL IS 10 mg/dL FOR MEDICAL PURPOSES ONLY Performed at [facility], [address] [place], [place] [address]    No results found.  Review of Systems  Constitutional: Negative for chills and fever.  HENT:  Negative for sore throat and tinnitus.   Eyes: Negative for blurred vision and redness.  Respiratory: Negative for cough and shortness of breath.   Cardiovascular: Negative for chest pain, palpitations, orthopnea and PND.  Gastrointestinal: Positive for abdominal pain. Negative for diarrhea, nausea and vomiting.  Genitourinary: Negative for dysuria, frequency and urgency.  Musculoskeletal: Negative for joint pain and myalgias.  Skin: Negative for rash.       No lesions  Neurological: Negative for speech change, focal weakness and weakness.  Endo/Heme/Allergies: Does not bruise/bleed easily.       No temperature intolerance  Psychiatric/Behavioral: Negative for depression and suicidal ideas.    Blood pressure 129/86, pulse 94, temperature 98 F (36.7 C), resp. rate 13, height '5\' 8"'  (1.727 m), weight 90.7 kg (200 lb), SpO2 98 %. Physical Exam  Constitutional: He is oriented to person, place, and time. He appears well-developed and well-nourished. No distress.  HENT:  Head: Normocephalic and atraumatic.  Mouth/Throat: Oropharynx is clear and moist.  Eyes: Conjunctivae and EOM are normal. Pupils are equal, round, and reactive to light. Scleral icterus is present.  Neck: Normal range of motion. Neck supple. No JVD present. No tracheal deviation present. No thyromegaly present.  Cardiovascular: Normal rate, regular rhythm and normal heart sounds. Exam reveals no gallop and no friction rub.  No murmur heard. Respiratory: Effort normal and breath sounds normal.  GI: Soft. Bowel sounds are normal. He exhibits distension. He exhibits no mass. There is hepatomegaly. There is tenderness. There is no rebound and no guarding.  Genitourinary:  Genitourinary Comments: Deferred  Musculoskeletal: Normal range of motion. He exhibits no edema.  Lymphadenopathy:    He has no cervical adenopathy.  Neurological: He is alert and oriented to person, place, and time. No cranial nerve deficit.  Skin: Skin is  warm and dry. No rash noted. No erythema.  Deep jaundice  Psychiatric: He has a normal mood and affect. His behavior is normal. Judgment and thought content normal.     Assessment/Plan This is a 37 year old male admitted for acute alcoholic hepatitis. 1.  Alcoholic hepatitis: Acute on chronic; elevated bilirubin and liver enzymes.  The patient has no nausea or vomiting at this time but does have abdominal pain.  Avoid Tylenol; manage pain with small doses of IV narcotic if necessary. 2.  Liver cirrhosis: Discriminate function elevated.  Her IV steroids to decrease 30-day mortality. 3.  Leukocytosis: Associated with hepatitis.  No evidence of infection at this time. 4.  Thrombocytopenia: Also associated with decreased liver function.  No anticoagulation due to high risk of bleeding. 5.  Alcohol abuse: Currently alcohol level is 328.  Observe for signs or symptoms of alcohol withdrawal and 36-48 hours. 6.  DVT prophylaxis: SCDs 7.  GI prophylaxis: Pantoprazole per home regimen The patient is a full code.  Time spent on admission orders and patient care approximately 45 minutes  Harrie Foreman, MD 10/13/2017, 7:12 AM

## 2017-10-13 NOTE — ED Notes (Signed)
Patient reports drinking 8 12 ounce beers and 4 shots in the last 24 hours.

## 2017-10-13 NOTE — ED Triage Notes (Signed)
Patient c/o mid abdominal pain and emesis. Patient has hx of cirrhosis. Patient is jaundiced.

## 2017-10-13 NOTE — Consult Note (Signed)
[facility] Consult   Reason for Consult: Consult for 37 year old man with a history of alcohol abuse and cirrhosis Referring Physician: Marcille Blanco Patient Identification: Derrick Blanchard MRN:  [id number] Principal Diagnosis: Alcohol dependence ([place]) Diagnosis:   Patient Active Problem List   Diagnosis Date Noted  . Elevated liver enzymes [R74.8] 09/28/2017  . Hyperbilirubinemia [E80.6]   . Alcohol dependence ([place]) [F10.20] 02/03/2015  . Alcoholic hepatitis [Y56.38] 01/30/2015  . Alcohol use [Z78.9] 01/30/2015  . Melena [K92.1] 01/30/2015  . Coagulopathy ([place]) [D68.9] 01/30/2015  . Hyponatremia [E87.1] 01/30/2015  . Bruise, trunk [S20.20XA] 01/30/2015  . Anemia [D64.9] 01/30/2015    Total Time spent with patient: 1 hour  Subjective:   Derrick Blanchard is a 37 y.o. male patient admitted with "feeling sick".  HPI: Patient interviewed chart reviewed.  37 year old man with a history of alcohol abuse and cirrhosis who is back in the hospital for abdominal pain and symptoms of cirrhosis.  Consult for alcohol abuse.  Patient tells me that he drank a 12 pack of beer yesterday.  He describes this as "a little beer".  He admits that he is drinking fairly often about 4 days out of the week at least.  Also uses marijuana fairly regularly.  He has chronic anxiety and jitteriness and is chronically suffering from poor sleep.  Denies any suicidal thoughts.  Denies any visual hallucinations although he says that he will hear voices at times.  He does not appear to be particularly disturbed by it.  His mood has been a little bit more down as he has been stressed out for the last couple months.  The most acute thing he mentions that is new is his father died early in 05-Sep-2023 of this year leaving just his cousin as his only relative that he is in touch with.  Medical history: Cirrhosis with the attendant coagulopathy, hyponatremia, low protein and other symptoms of cirrhosis  Social  history: Receives disability.  Lives with his cousin.  He still has a driver's license but has very little social activity.  Substance abuse history: Long-standing alcohol abuse.  He says he has been able to stop drinking for about a year in the past.  He is not able to give a clear description of what made a difference.  He has seen therapists previously but has not really engaged in substance abuse treatment.  I do not see that there is any history of alcohol withdrawal seizures or delirium tremens.  We have tried ReVia in the past as a treatment.  Past Psychiatric History: Chronic complaints of anxiety.  Chronic poor sleep.  Almost impossible in someone with this condition to differentiate an anxiety disorder from the effects of chronic alcohol abuse and the life stresses associated with that as well as the effects of the cirrhosis.  Patient says he wishes he had some kind of medicine that would help with his nerves.  Does not have a history of suicide attempts.  Does not have a history of violence.  Risk to Self: Is patient at risk for suicide?: No Risk to Others:   Prior Inpatient Therapy:   Prior Outpatient Therapy:    Past Medical History:  Past Medical History:  Diagnosis Date  . Cirrhosis ([place])   . Hepatitis     Past Surgical History:  Procedure Laterality Date  . ESOPHAGOGASTRODUODENOSCOPY (EGD) WITH PROPOFOL N/A 02/03/2015   Procedure: ESOPHAGOGASTRODUODENOSCOPY (EGD) WITH PROPOFOL;  Surgeon: Josefine Class, MD;  Location: [facility] ENDOSCOPY;  Service:  Endoscopy;  Laterality: N/A;   Family History:  Family History  Problem Relation Age of Onset  . Lung cancer Mother   . Cirrhosis Father    Family Psychiatric  History: His father also had alcohol dependence and his father died of cirrhosis so the patient is familiar at first hand with the horrible end stages of the disease. Social History:  Social History   Substance and Sexual Activity  Alcohol Use Yes     Social History    Substance and Sexual Activity  Drug Use Yes  . Types: Marijuana, Cocaine    Social History   Socioeconomic History  . Marital status: Single    Spouse name: None  . Number of children: None  . Years of education: None  . Highest education level: None  Social Needs  . Financial resource strain: None  . Food insecurity - worry: None  . Food insecurity - inability: None  . Transportation needs - medical: None  . Transportation needs - non-medical: None  Occupational History  . None  Tobacco Use  . Smoking status: Current Some Day Smoker    Types: Cigarettes  . Smokeless tobacco: Never Used  Substance and Sexual Activity  . Alcohol use: Yes  . Drug use: Yes    Types: Marijuana, Cocaine  . Sexual activity: Yes  Other Topics Concern  . None  Social History Narrative  . None   Additional Social History:    Allergies:  No Known Allergies  Labs:  Results for orders placed or performed during the hospital encounter of 10/13/17 (from the past 48 hour(s))  Basic metabolic panel     Status: Abnormal   Collection Time: 10/13/17  5:15 AM  Result Value Ref Range   Sodium 136 135 - 145 mmol/L   Potassium 3.8 3.5 - 5.1 mmol/L    Comment: HEMOLYSIS AT THIS LEVEL MAY AFFECT RESULT   Chloride 105 101 - 111 mmol/L   CO2 22 22 - 32 mmol/L   Glucose, Bld 121 (H) 65 - 99 mg/dL   BUN <5 (L) 6 - 20 mg/dL   Creatinine, Ser <0.30 (L) 0.61 - 1.24 mg/dL    Comment: ICTERUS AT THIS LEVEL MAY AFFECT RESULT   Calcium 8.0 (L) 8.9 - 10.3 mg/dL   GFR calc non Af Amer NOT CALCULATED >60 mL/min   GFR calc Af Amer NOT CALCULATED >60 mL/min    Comment: (NOTE) The eGFR has been calculated using the CKD EPI equation. This calculation has not been validated in all clinical situations. eGFR's persistently <60 mL/min signify possible Chronic Kidney Disease.    Anion gap 9 5 - 15    Comment: Performed at [facility], [address] [place], [place] [address]  Hepatic function panel      Status: Abnormal   Collection Time: 10/13/17  5:15 AM  Result Value Ref Range   Total Protein 6.9 6.5 - 8.1 g/dL   Albumin 2.6 (L) 3.5 - 5.0 g/dL   AST 174 (H) 15 - 41 U/L    Comment: HEMOLYSIS AT THIS LEVEL MAY AFFECT RESULT   ALT 67 (H) 17 - 63 U/L    Comment: ICTERUS AT THIS LEVEL MAY AFFECT RESULT   Alkaline Phosphatase 158 (H) 38 - 126 U/L   Total Bilirubin 16.9 (H) 0.3 - 1.2 mg/dL    Comment: HEMOLYSIS AT THIS LEVEL MAY AFFECT RESULT   Bilirubin, Direct 8.5 (H) 0.1 - 0.5 mg/dL    Comment: HEMOLYSIS AT THIS  LEVEL MAY AFFECT RESULT   Indirect Bilirubin 8.4 (H) 0.3 - 0.9 mg/dL    Comment: Performed at [facility], [address] [place], [place] [address]  Lipase, blood     Status: Abnormal   Collection Time: 10/13/17  5:15 AM  Result Value Ref Range   Lipase 57 (H) 11 - 51 U/L    Comment: Performed at [facility], [address] [place], [place] [address]  CBC with Differential     Status: Abnormal   Collection Time: 10/13/17  5:15 AM  Result Value Ref Range   WBC 13.2 (H) 3.8 - 10.6 K/uL   RBC 3.47 (L) 4.40 - 5.90 MIL/uL   Hemoglobin 10.9 (L) 13.0 - 18.0 g/dL   HCT 32.5 (L) 40.0 - 52.0 %   MCV 93.7 80.0 - 100.0 fL   MCH 31.4 26.0 - 34.0 pg   MCHC 33.6 32.0 - 36.0 g/dL   RDW 20.2 (H) 11.5 - 14.5 %   Platelets 122 (L) 150 - 440 K/uL   Neutrophils Relative % 77 %   Neutro Abs 10.3 (H) 1.4 - 6.5 K/uL   Lymphocytes Relative 15 %   Lymphs Abs 2.0 1.0 - 3.6 K/uL   Monocytes Relative 7 %   Monocytes Absolute 0.9 0.2 - 1.0 K/uL   Eosinophils Relative 0 %   Eosinophils Absolute 0.0 0 - 0.7 K/uL   Basophils Relative 1 %   Basophils Absolute 0.1 0 - 0.1 K/uL    Comment: Performed at [facility], [address] [place], [place] [address]  Protime-INR     Status: Abnormal   Collection Time: 10/13/17  5:15 AM  Result Value Ref Range   Prothrombin Time 16.6 (H) 11.4 - 15.2 seconds   INR 1.35     Comment: Performed at [facility],  [address] [place], [place] [address]  Ethanol     Status: Abnormal   Collection Time: 10/13/17  5:15 AM  Result Value Ref Range   Alcohol, Ethyl (B) 328 (HH) <10 mg/dL    Comment: CRITICAL RESULT CALLED TO, READ BACK BY AND VERIFIED WITH JANNA ALLINGTON ON 10/11/17 AT 0609 JAG        LOWEST DETECTABLE LIMIT FOR SERUM ALCOHOL IS 10 mg/dL FOR MEDICAL PURPOSES ONLY Performed at [facility], [address] [place], [place] [address]   TSH     Status: None   Collection Time: 10/13/17  5:15 AM  Result Value Ref Range   TSH 3.733 0.350 - 4.500 uIU/mL    Comment: ICTERUS AT THIS LEVEL MAY AFFECT RESULT Performed by a 3rd Generation assay with a functional sensitivity of <=0.01 uIU/mL. Performed at [facility], [address] [place],  [address]   Urinalysis, Complete w Microscopic     Status: Abnormal   Collection Time: 10/13/17  9:05 AM  Result Value Ref Range   Color, Urine AMBER (A) YELLOW    Comment: BIOCHEMICALS MAY BE AFFECTED BY COLOR   APPearance CLEAR (A) CLEAR   Specific Gravity, Urine 1.005 1.005 - 1.030   pH 6.0 5.0 - 8.0   Glucose, UA NEGATIVE NEGATIVE mg/dL   Hgb urine dipstick NEGATIVE NEGATIVE   Bilirubin Urine SMALL (A) NEGATIVE   Ketones, ur NEGATIVE NEGATIVE mg/dL   Protein, ur NEGATIVE NEGATIVE mg/dL   Nitrite NEGATIVE NEGATIVE   Leukocytes, UA NEGATIVE NEGATIVE   RBC / HPF 0-5 0 - 5 RBC/hpf   WBC, UA 0-5 0 -  5 WBC/hpf   Bacteria, UA NONE SEEN NONE SEEN   Squamous Epithelial / LPF NONE SEEN NONE SEEN    Comment: Performed at [facility], [address] [place], [place] [address]  Urine Drug Screen, Qualitative     Status: Abnormal   Collection Time: 10/13/17  9:05 AM  Result Value Ref Range   Tricyclic, Ur Screen NONE DETECTED NONE DETECTED   Amphetamines, Ur Screen NONE DETECTED NONE DETECTED   MDMA (Ecstasy)Ur Screen NONE DETECTED NONE DETECTED   Cocaine Metabolite,Ur [place] NONE DETECTED NONE DETECTED    Opiate, Ur Screen POSITIVE (A) NONE DETECTED   Phencyclidine (PCP) Ur S NONE DETECTED NONE DETECTED   Cannabinoid 50 Ng, Ur [place] POSITIVE (A) NONE DETECTED   Barbiturates, Ur Screen NONE DETECTED NONE DETECTED   Benzodiazepine, Ur Scrn NONE DETECTED NONE DETECTED   Methadone Scn, Ur NONE DETECTED NONE DETECTED    Comment: (NOTE) Tricyclics + metabolites, urine    Cutoff 1000 ng/mL Amphetamines + metabolites, urine  Cutoff 1000 ng/mL MDMA (Ecstasy), urine              Cutoff 500 ng/mL Cocaine Metabolite, urine          Cutoff 300 ng/mL Opiate + metabolites, urine        Cutoff 300 ng/mL Phencyclidine (PCP), urine         Cutoff 25 ng/mL Cannabinoid, urine                 Cutoff 50 ng/mL Barbiturates + metabolites, urine  Cutoff 200 ng/mL Benzodiazepine, urine              Cutoff 200 ng/mL Methadone, urine                   Cutoff 300 ng/mL The urine drug screen provides only a preliminary, unconfirmed analytical test result and should not be used for non-medical purposes. Clinical consideration and professional judgment should be applied to any positive drug screen result due to possible interfering substances. A more specific alternate chemical method must be used in order to obtain a confirmed analytical result. Gas chromatography / mass spectrometry (GC/MS) is the preferred confirmat ory method. Performed at [facility], [address] [place], [place] [address]     Current Facility-Administered Medications  Medication Dose Route Frequency Provider Last Rate Last Dose  . 0.9 %  sodium chloride infusion   Intravenous Continuous Harrie Foreman, MD 125 mL/hr at 10/13/17 1117    . docusate sodium (COLACE) capsule 100 mg  100 mg Oral BID Harrie Foreman, MD   100 mg at 10/13/17 1117  . HYDROcodone-acetaminophen (NORCO/VICODIN) 5-325 MG per tablet 1 tablet  1 tablet Oral Q4H PRN Harrie Foreman, MD      . ibuprofen (ADVIL,MOTRIN) tablet 400 mg  400 mg Oral Q8H PRN  Harrie Foreman, MD      . Derrill Memo ON 10/14/2017] Influenza vac split quadrivalent PF (FLUARIX) injection 0.5 mL  0.5 mL Intramuscular Tomorrow-1000 Wieting, Richard, MD      . LORazepam (ATIVAN) tablet 0.5 mg  0.5 mg Oral Q8H Harrie Foreman, MD   0.5 mg at 10/13/17 1117  . metoprolol tartrate (LOPRESSOR) tablet 12.5 mg  12.5 mg Oral BID Harrie Foreman, MD   12.5 mg at 10/13/17 1118  . morphine 2 MG/ML injection 2 mg  2 mg Intravenous Q4H PRN Harrie Foreman, MD   2 mg at 10/13/17 1126  . ondansetron (  ZOFRAN) tablet 4 mg  4 mg Oral Q6H PRN Harrie Foreman, MD       Or  . ondansetron [facility]) injection 4 mg  4 mg Intravenous Q6H PRN Harrie Foreman, MD      . pantoprazole (PROTONIX) EC tablet 40 mg  40 mg Oral Daily Harrie Foreman, MD   40 mg at 10/13/17 1117  . [START ON 10/14/2017] pneumococcal 23 valent vaccine (PNU-IMMUNE) injection 0.5 mL  0.5 mL Intramuscular Tomorrow-1000 Loletha Grayer, MD        Musculoskeletal: Strength & Muscle Tone: decreased Gait & Station: unsteady Patient leans: N/A  Psychiatric Specialty Exam: Physical Exam  Nursing note and vitals reviewed. Constitutional: He appears well-developed.  HENT:  Head: Normocephalic and atraumatic.  Eyes: Conjunctivae are normal. Pupils are equal, round, and reactive to light.  Neck: Normal range of motion.  Cardiovascular: Regular rhythm and normal heart sounds.  Respiratory: Effort normal. No respiratory distress.  GI: Soft. He exhibits distension. There is tenderness.  Musculoskeletal: Normal range of motion.  Neurological: He is alert.  Skin: Skin is warm and dry.  Jaundiced  Psychiatric: His speech is normal. Judgment normal. His mood appears anxious. He is slowed. Thought content is not paranoid. Cognition and memory are impaired. He expresses no homicidal and no suicidal ideation.    Review of Systems  Constitutional: Negative.   HENT: Negative.   Eyes: Negative.   Respiratory: Negative.    Cardiovascular: Negative.   Gastrointestinal: Negative.   Musculoskeletal: Negative.   Skin: Negative.   Neurological: Negative.   Psychiatric/Behavioral: Positive for hallucinations and substance abuse. Negative for depression, memory loss and suicidal ideas. The patient is nervous/anxious and has insomnia.     Blood pressure 111/66, pulse 93, temperature 98.2 F (36.8 C), temperature source Oral, resp. rate 18, height _0  (1.727 m), weight 90.7 kg (200 lb), SpO2 98 %.Body mass index is 30.41 kg/m.  General Appearance: Disheveled  Eye Contact:  Minimal  Speech:  Slow  Volume:  Decreased  Mood:  Dysphoric  Affect:  Congruent  Thought Process:  Goal Directed  Orientation:  Full (Time, Place, and Person)  Thought Content:  Logical  Suicidal Thoughts:  No  Homicidal Thoughts:  No  Memory:  Immediate;   Fair Recent;   Fair Remote;   Fair  Judgement:  Fair  Insight:  Shallow  Psychomotor Activity:  Decreased  Concentration:  Concentration: Fair  Recall:  [employer] of Knowledge:  Fair  Language:  Fair  Akathisia:  No  Handed:  Right  AIMS (if indicated):     Assets:  Housing Social Support  ADL's:  Impaired  Cognition:  Impaired,  Mild  Sleep:        Treatment Plan Summary: Daily contact with patient to assess and evaluate symptoms and progress in treatment, Medication management and Plan This is a 37 year old man with alcohol dependence resulting in cirrhosis.  I last saw him in the hospital almost 3 years ago and at that time he already had advanced liver problems related to alcohol abuse since that time his problems have obviously progressed.  Patient is on detox protocol which is appropriate although from his past history he is probably relatively unlikely to have DTs.  Nonetheless this should certainly be watched for.  Treatment of his alcohol abuse and attempts to get him into remission are unfortunately difficult and a positive outcome seems unlikely.  Patient has  little or no history with appropriate substance abuse  treatment.  He is focused on controlling his anxiety.  He mentions medicines such as Xanax and Valium.  Unfortunately, unless someone is under extremely strict substance abuse treatment the outcome of using benzodiazepines in patients with alcohol abuse is almost inevitably to create 2 problems instead of just one.  I would not recommend starting or continuing long-term benzodiazepines.  Other medications for anxiety such as buspirone may be of some help and we can start a low dose of that.  In the past I have prescribed ReVia for him in hopes that it would help with his drinking and I think that is a reasonable thing to try again.  Patient has been counseled about how his continued drinking is leading towards end-stage liver disease and death.  I will sign out to the doctor on call over the weekend if needed..  Disposition: Patient does not meet criteria for psychiatric inpatient admission. Supportive therapy provided about ongoing stressors. Discussed crisis plan, support from social network, calling 911, coming to the Emergency Department, and calling Suicide Hotline.  Alethia Berthold, MD 10/13/2017 3:55 PM

## 2017-10-13 NOTE — Care Management Note (Signed)
Case Management Note  Patient Details  Name: Derrick Blanchard MRN: [id number] Date of Birth: 1980-12-28  Subjective/Objective:     Admitted to [facility] with the diagnosis of alcohol hepatitis. Brother is Derrick Blanchard [phone]).   No insurance. No primary care physician.              Action/Plan: Received referral for information about physician services.  [name] has been given applications to [facility] and [facility] in the past. Will issue another application. Given outpatient substance abuse resources in the past   Expected Discharge Date:                  Expected Discharge Plan:     In-House Referral:   yes  Discharge planning Services   yes  Post Acute Care Choice:    Choice offered to:     DME Arranged:    DME Agency:     HH Arranged:    HH Agency:     Status of Service:     If discussed at [employer] Meetings, dates discussed:    Additional Comments:  Gwenette GreetBrenda S Dashawn Bartnick, RN MSN CCM Care Management [phone] 10/13/2017, 2:32 PM

## 2017-10-13 NOTE — Progress Notes (Signed)
Patient states his abdominal pain is 7/10 after morphine prn, notified Dr. Renae GlossWieting, no new orders received.

## 2017-10-13 NOTE — ED Provider Notes (Signed)
[facility] Emergency Department Provider Note  ____________________________________________   First MD Initiated Contact with Patient 10/13/17 0518     (approximate)  I have reviewed the triage vital signs and the nursing notes.   HISTORY  Chief Complaint Abdominal Pain   HPI Derrick Blanchard is a 37 y.o. male who comes to the emergency department via EMS with roughly 12 hours of intermittent cramping upper abdominal discomfort.  His pain had gradual onset is intermittent.  Nothing seems to make it better or worse.  Is associated with nausea.  The patient has a complex past medical history including alcoholic cirrhosis for which she is taking unknown medications.  He reports drinking several beers and 9 shots of liquor this evening prior to the initiation of his pain.  He does not remember when his last paracentesis was.  He denies shortness of breath.  Past Medical History:  Diagnosis Date  . Cirrhosis (HCC)   . Hepatitis     Patient Active Problem List   Diagnosis Date Noted  . Elevated liver enzymes 09/28/2017  . Hyperbilirubinemia   . Alcohol dependence (HCC) 02/03/2015  . Alcoholic hepatitis 01/30/2015  . Alcohol use 01/30/2015  . Melena 01/30/2015  . Coagulopathy (HCC) 01/30/2015  . Hyponatremia 01/30/2015  . Bruise, trunk 01/30/2015  . Anemia 01/30/2015    Past Surgical History:  Procedure Laterality Date  . ESOPHAGOGASTRODUODENOSCOPY (EGD) WITH PROPOFOL N/A 02/03/2015   Procedure: ESOPHAGOGASTRODUODENOSCOPY (EGD) WITH PROPOFOL;  Surgeon: Elnita Maxwell, MD;  Location: [facility] ENDOSCOPY;  Service: Endoscopy;  Laterality: N/A;    Prior to Admission medications   Medication Sig Start Date End Date Taking? Authorizing Provider  amoxicillin-clavulanate (AUGMENTIN) 500-125 MG tablet TK 1 T PO TID FOR 7 DAYS 08/03/17  Yes [provider]  LORazepam (ATIVAN) 0.5 MG tablet Take 1 tablet (0.5 mg total) by mouth every 8 (eight)  hours. 09/26/17  Yes Katha Hamming, MD  metoprolol tartrate (LOPRESSOR) 25 MG tablet Take 0.5 tablets (12.5 mg total) by mouth 2 (two) times daily. 09/26/17 09/26/18 Yes Katha Hamming, MD  pantoprazole (PROTONIX) 40 MG tablet Take 1 tablet (40 mg total) by mouth daily. 09/26/17  Yes Katha Hamming, MD  prednisoLONE (ORAPRED) 15 MG/5ML solution Take 30.2 mLs (90.6 mg total) by mouth daily. 09/26/17 09/26/18 Yes Katha Hamming, MD  potassium chloride (KLOR-CON) 20 MEQ packet Take 20 mEq by mouth daily. Patient not taking: Reported on 09/28/2017 09/26/17   Katha Hamming, MD    Allergies Patient has no known allergies.  No family history on file.  Social History Social History   Tobacco Use  . Smoking status: Current Some Day Smoker    Types: Cigarettes  . Smokeless tobacco: Never Used  Substance Use Topics  . Alcohol use: Yes  . Drug use: Yes    Types: Marijuana, Cocaine    Review of Systems Constitutional: No fever/chills Eyes: No visual changes. ENT: No sore throat. Cardiovascular: Denies chest pain. Respiratory: Denies shortness of breath. Gastrointestinal: Positive for abdominal pain.  Positive for nausea, no vomiting.  No diarrhea.  No constipation. Genitourinary: Negative for dysuria. Musculoskeletal: Negative for back pain. Skin: Negative for rash. Neurological: Negative for headaches, focal weakness or numbness.   ____________________________________________   PHYSICAL EXAM:  VITAL SIGNS: ED Triage Vitals  Enc Vitals Group     BP      Pulse      Resp      Temp      Temp src  SpO2      Weight      Height      Head Circumference      Peak Flow      Pain Score      Pain Loc      Pain Edu?      Excl. in GC?     Constitutional: Appears chronically ill.  Jaundiced and icteric.  Alcohol on his breath Eyes: PERRL EOMI. significant icterus Head: Atraumatic. Nose: No congestion/rhinnorhea. Mouth/Throat: No trismus Neck: No  stridor.   Cardiovascular: Normal rate, regular rhythm. Grossly normal heart sounds.  Good peripheral circulation. Respiratory: Increased respiratory effort.  No retractions. Lungs CTAB and moving good air Gastrointestinal: Distended abdomen although not tense.  Diffusely tender to palpation with no focality.   Musculoskeletal: No lower extremity edema   Neurologic:  Normal speech and language. No gross focal neurologic deficits are appreciated. Skin: Significant jaundice. Psychiatric: Intoxicated    ____________________________________________   DIFFERENTIAL includes but not limited to  Alcoholic hepatitis, alcoholic cirrhosis, tense ascites, spontaneous bacterial peritonitis, sepsis ____________________________________________   LABS (all labs ordered are listed, but only abnormal results are displayed)  Labs Reviewed  BASIC METABOLIC PANEL - Abnormal; Notable for the following components:      Result Value   Glucose, Bld 121 (*)    BUN <5 (*)    Creatinine, Ser <0.30 (*)    Calcium 8.0 (*)    All other components within normal limits  HEPATIC FUNCTION PANEL - Abnormal; Notable for the following components:   Albumin 2.6 (*)    AST 174 (*)    ALT 67 (*)    Alkaline Phosphatase 158 (*)    Total Bilirubin 16.9 (*)    Bilirubin, Direct 8.5 (*)    Indirect Bilirubin 8.4 (*)    All other components within normal limits  LIPASE, BLOOD - Abnormal; Notable for the following components:   Lipase 57 (*)    All other components within normal limits  CBC WITH DIFFERENTIAL/PLATELET - Abnormal; Notable for the following components:   WBC 13.2 (*)    RBC 3.47 (*)    Hemoglobin 10.9 (*)    HCT 32.5 (*)    RDW 20.2 (*)    Platelets 122 (*)    Neutro Abs 10.3 (*)    All other components within normal limits  PROTIME-INR - Abnormal; Notable for the following components:   Prothrombin Time 16.6 (*)    All other components within normal limits  ETHANOL - Abnormal; Notable for the  following components:   Alcohol, Ethyl (B) 328 (*)    All other components within normal limits  URINALYSIS, COMPLETE (UACMP) WITH MICROSCOPIC  URINE DRUG SCREEN, QUALITATIVE ([facility] ONLY)    Lab work reviewed by me with a number of abnormalities most notably significant increase in his is consistent with alcoholic hepatitis __________________________________________  EKG   ____________________________________________  RADIOLOGY   ____________________________________________   PROCEDURES  Procedure(s) performed: no  Procedures  Critical Care performed: no  Observation: no ____________________________________________   INITIAL IMPRESSION / ASSESSMENT AND PLAN / ED COURSE  Pertinent labs & imaging results that were available during my care of the patient were reviewed by me and considered in my medical decision making (see chart for details).  On arrival the patient is uncomfortable appearing.  He is clearly in discomfort with chronic liver disease.  4 mg of morphine are pending along with general labs.  Patient is now comfortable after receiving his morphine.  His  blood work is concerning for significant progression of his alcoholic hepatitis.  His discriminate function puts him in the poor category.  At this point given his rapid decline do believe he requires inpatient admission for IV steroids and liver evaluation.  I discussed with the patient who verbalized understanding and agreed with the plan.  And then discussed with the hospitalist Dr. Sheryle Hail who is graciously agreed to admit the patient to his service.      ____________________________________________   FINAL CLINICAL IMPRESSION(S) / ED DIAGNOSES  Final diagnoses:  Alcoholic hepatitis with ascites  Alcoholic intoxication with complication (HCC)      NEW MEDICATIONS STARTED DURING THIS VISIT:  New Prescriptions   No medications on file     Note:  This document was prepared using Dragon voice  recognition software and may include unintentional dictation errors.     Merrily Brittle, MD 10/13/17 [phone]

## 2017-10-14 ENCOUNTER — Other Ambulatory Visit: Payer: Self-pay

## 2017-10-14 LAB — COMPREHENSIVE METABOLIC PANEL
ALT: 49 U/L (ref 17–63)
ANION GAP: 6 (ref 5–15)
AST: 124 U/L — ABNORMAL HIGH (ref 15–41)
Albumin: 2.1 g/dL — ABNORMAL LOW (ref 3.5–5.0)
Alkaline Phosphatase: 145 U/L — ABNORMAL HIGH (ref 38–126)
BUN: 6 mg/dL (ref 6–20)
CHLORIDE: 104 mmol/L (ref 101–111)
CO2: 24 mmol/L (ref 22–32)
Calcium: 8.1 mg/dL — ABNORMAL LOW (ref 8.9–10.3)
Creatinine, Ser: 0.59 mg/dL — ABNORMAL LOW (ref 0.61–1.24)
GFR calc non Af Amer: >60 mL/min (ref >60)
GLUCOSE: 97 mg/dL (ref 65–99)
POTASSIUM: 3.1 mmol/L — AB (ref 3.5–5.1)
SODIUM: 134 mmol/L — AB (ref 135–145)
Total Bilirubin: 13.1 mg/dL — ABNORMAL HIGH (ref 0.3–1.2)
Total Protein: 5.7 g/dL — ABNORMAL LOW (ref 6.5–8.1)

## 2017-10-14 MED ORDER — SALINE SPRAY 0.65 % NA SOLN
1.0000 | NASAL | Status: DC | PRN
  Filled 2017-10-14: qty 44

## 2017-10-14 MED ORDER — POTASSIUM CHLORIDE CRYS ER 20 MEQ PO TBCR
40.0000 meq | EXTENDED_RELEASE_TABLET | ORAL | Status: AC
  Administered 2017-10-14 (×2): 40 meq via ORAL
  Filled 2017-10-14 (×2): qty 2

## 2017-10-14 NOTE — Plan of Care (Signed)
  Education: Knowledge of General Education information will improve 10/14/2017 2055 - Progressing by Donnel SaxonKennedy, Heron Pitcock L, RN   Health Behavior/Discharge Planning: Ability to manage health-related needs will improve 10/14/2017 2055 - Progressing by Donnel SaxonKennedy, Neeta Storey L, RN   Clinical Measurements: Ability to maintain clinical measurements within normal limits will improve 10/14/2017 2055 - Progressing by Donnel SaxonKennedy, Keiton Cosma L, RN Will remain free from infection 10/14/2017 2055 - Progressing by Donnel SaxonKennedy, Elijah Michaelis L, RN Diagnostic test results will improve 10/14/2017 2055 - Progressing by Donnel SaxonKennedy, Llewellyn Choplin L, RN Respiratory complications will improve 10/14/2017 2055 - Progressing by Donnel SaxonKennedy, Yasemin Rabon L, RN   Activity: Risk for activity intolerance will decrease 10/14/2017 2055 - Progressing by Donnel SaxonKennedy, Adasyn Mcadams L, RN   Pain Managment: General experience of comfort will improve 10/14/2017 2055 - Progressing by Donnel SaxonKennedy, Auria Mckinlay L, RN

## 2017-10-14 NOTE — Progress Notes (Signed)
Patient has requested norco prn for lower abdominal pain twice this shift. Patient stated pain is at a 5, which is his baseline. No tremors this shift. CIWA score 0.

## 2017-10-14 NOTE — Progress Notes (Signed)
Advance care planning  Discussed with patient regarding his alcoholic cirrhosis and alcohol like hepatitis.  Counseled regarding his ongoing alcohol abuse.  Patient understands his poor prognosis.  He mentions he had quit alcohol for 5 days and did not see any improvement.  Explained that he needs to quit alcohol completely.  Cirrhosis is a chronic condition but his acute hepatitis should improve with abstinence from alcohol.  Patient continues to have alcohol withdrawals.  Advised that GI will see him later on today. Patient's CODE STATUS is full code.  All questions answered.  Poor long-term prognosis with ongoing alcohol abuse.  Consulted case Production designer, theatre/television/filmmanager.  Time spent 20 minutes

## 2017-10-14 NOTE — Consult Note (Signed)
[facility] GI Inpatient Consult Note   Jamey Reas, M.D.  Reason for Consult: Alcoholic hepatitis   Attending Requesting Consult: Milagros Loll, M.D.  Outpatient Primary Physician: N/A  History of Present Illness: Derrick Blanchard is a 37 y.o. 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  He has had multiple hospital admissions at [facility] as well as [facility].  He has been seen in our gastroenterology practice as recently as 2016 by Dr. Dow Adolph with EGD revealing retained gastric contents, no appreciable varices.  He has undergone a CT scan on 09/23/2017 revealing marked paraesophageal varices along with liver showing marked coarsening of the liver echotexture without focal mass appreciated.  There is no known previous history of patient having variceal bleeds.  He continues to drink at unknown frequencies and has had numerous times where he has abstained and then suffered recidivism of alcohol use.  He has undergone IV infusions of albumin and Lasix for anasarca in 2016 at [facility] affiliated hospitals.  Past Medical History:  Past Medical History:  Diagnosis Date  . Cirrhosis (HCC)   . Hepatitis     Problem List: Patient Active Problem List   Diagnosis Date Noted  . Elevated liver enzymes 09/28/2017  . Hyperbilirubinemia   . Alcohol dependence (HCC) 02/03/2015  . Alcoholic hepatitis 01/30/2015  . Alcohol use 01/30/2015  . Melena 01/30/2015  . Coagulopathy (HCC) 01/30/2015  . Hyponatremia 01/30/2015  . Bruise, trunk 01/30/2015  . Anemia 01/30/2015    Past Surgical History: Past Surgical History:  Procedure Laterality Date  . ESOPHAGOGASTRODUODENOSCOPY (EGD) WITH PROPOFOL N/A 02/03/2015   Procedure: ESOPHAGOGASTRODUODENOSCOPY (EGD) WITH PROPOFOL;  Surgeon: Elnita Maxwell, MD;  Location: [facility] ENDOSCOPY;  Service: Endoscopy;  Laterality: N/A;     Allergies: No Known Allergies  Home Medications: Medications Prior to Admission  Medication Sig Dispense Refill Last Dose  . amoxicillin-clavulanate (AUGMENTIN) 500-125 MG tablet TK 1 T PO TID FOR 7 DAYS  0 10/12/2017 at Unknown time  . LORazepam (ATIVAN) 0.5 MG tablet Take 1 tablet (0.5 mg total) by mouth every 8 (eight) hours. 10 tablet 0 unknown at unknown  . metoprolol tartrate (LOPRESSOR) 25 MG tablet Take 0.5 tablets (12.5 mg total) by mouth 2 (two) times daily. 60 tablet 1 10/12/2017 at Unknown time  . pantoprazole (PROTONIX) 40 MG tablet Take 1 tablet (40 mg total) by mouth daily. 30 tablet 0 10/12/2017 at Unknown time  . prednisoLONE (ORAPRED) 15 MG/5ML solution Take 30.2 mLs (90.6 mg total) by mouth daily. 240 mL 0 10/12/2017 at Unknown time  . potassium chloride (KLOR-CON) 20 MEQ packet Take 20 mEq by mouth daily. (Patient not taking: Reported on 09/28/2017) 5 tablet 0 Not Taking at Unknown time   Home medication reconciliation was completed with the patient.   Scheduled Inpatient Medications:   . busPIRone  5 mg Oral BID  . docusate sodium  100 mg Oral BID  . Influenza vac split quadrivalent PF  0.5 mL Intramuscular Tomorrow-1000  . LORazepam  0.5 mg Oral Q8H  . metoprolol tartrate  12.5 mg Oral BID  . naltrexone  25 mg Oral Daily  . pantoprazole  40 mg Oral Daily  . pneumococcal 23 valent vaccine  0.5 mL Intramuscular Tomorrow-1000    Continuous Inpatient Infusions:   . sodium chloride 125 mL/hr at 10/14/17 0957    PRN Inpatient Medications:  HYDROcodone-acetaminophen, ibuprofen, morphine injection, ondansetron **OR** ondansetron (ZOFRAN) IV  Family History: family history includes Cirrhosis in his father; Lung cancer in his mother.   GI Family History:  Negative.  Social History:   reports that he has been smoking cigarettes.  he has never used smokeless tobacco. He reports that he drinks alcohol. He reports that he uses drugs. Drugs: Marijuana and Cocaine. The patient  denies ETOH, tobacco, or drug use.    Review of Systems: Review of Systems - Negative except HPI  Physical Examination: BP 113/64   Pulse 74   Temp 98.4 F (36.9 C) (Oral)   Resp 18   Ht 5\' 8"  (1.727 m)   Wt 95.9 kg (211 lb 6.7 oz)   SpO2 97%   BMI 32.15 kg/m  Physical Exam  Constitutional: He is oriented to person, place, and time and well-developed, well-nourished, and in no distress. No distress.  HENT:  Head: Normocephalic and atraumatic.  Eyes: Pupils are equal, round, and reactive to light.  Neck: Normal range of motion.  Cardiovascular: Normal rate and regular rhythm.  Pulmonary/Chest: Effort normal and breath sounds normal.  Abdominal: Soft. Bowel sounds are normal. He exhibits shifting dullness and distension. There is no tenderness.  Musculoskeletal: He exhibits edema.  Neurological: He is alert and oriented to person, place, and time.  Skin: Skin is warm and dry. He is not diaphoretic.    Data: Lab Results  Component Value Date   WBC 13.2 (H) 10/13/2017   HGB 10.9 (L) 10/13/2017   HCT 32.5 (L) 10/13/2017   MCV 93.7 10/13/2017   PLT 122 (L) 10/13/2017   Recent Labs  Lab 10/13/17 0515  HGB 10.9*   Lab Results  Component Value Date   NA 134 (L) 10/14/2017   K 3.1 (L) 10/14/2017   CL 104 10/14/2017   CO2 24 10/14/2017   BUN 6 10/14/2017   CREATININE 0.59 (L) 10/14/2017   Lab Results  Component Value Date   ALT 49 10/14/2017   AST 124 (H) 10/14/2017   ALKPHOS 145 (H) 10/14/2017   BILITOT 13.1 (H) 10/14/2017   Recent Labs  Lab 10/13/17 0515  INR 1.35   CBC Latest Ref Rng & Units 10/13/2017 09/29/2017 09/28/2017  WBC 3.8 - 10.6 K/uL 13.2(H) 2.8(L) 5.2  Hemoglobin 13.0 - 18.0 g/dL 10.9(L) 9.4(L) 11.1(L)  Hematocrit 40.0 - 52.0 % 32.5(L) 27.8(L) 32.6(L)  Platelets 150 - 440 K/uL 122(L) 40(L) 48(L)    STUDIES: No results found. @IMAGES @  Assessment: 1. Alcoholic cirrhosis, MELD Score of 28 - Significant 50% mortaility within the next year  without alcohol abstinence.   2. Alcoholic hepatitis  - Discriminant function of 22. On IV steroids in the past, not sufficiently elevated for steroids at this time.   3. Anemia - possibly secondary to bone marrow suppression from alcohol abuse and/or occult gastrointestinal blood loss.  4 Shifting dullness on abdominal examination -   Recommendations: 1. Check complete abdominal ultrasound for ascites. 2. Serial H/H. 3. Continue current medications. Not sure that we need to resume steroids at this point 4. Supportive treatment. I had a long discussion about his high mortality rate despite his young age given ongoing alcohol abuse/dependence. Patient acknowledged his risk and the need to abstain from alcohol.    Thank you for the consult. Please call with questions or concerns.  Rosina Lowensteinoledo, Teodoro, "Mellody DanceKeith" MD [facility] [address] [place], [place] [address] [phone]  10/14/2017 2:23 PM

## 2017-10-14 NOTE — Progress Notes (Signed)
[employer] [facility] [place] [facility]   PATIENT NAME: Derrick Blanchard    MR#:  [id number]  DATE OF BIRTH:  July 17, 1981  SUBJECTIVE:  CHIEF COMPLAINT:   Chief Complaint  Patient presents with  . Abdominal Pain   Continues to have mid abdominal pain.  Nausea.  No vomiting.  No diarrhea. Patient was drinking heavily until the day he came to the emergency room.  REVIEW OF SYSTEMS:    Review of Systems  Constitutional: Positive for malaise/fatigue. Negative for chills, fever and weight loss.  HENT: Negative for hearing loss and nosebleeds.   Eyes: Negative for blurred vision, double vision and pain.  Respiratory: Negative for cough, hemoptysis, sputum production, shortness of breath and wheezing.   Cardiovascular: Negative for chest pain, palpitations, orthopnea and leg swelling.  Gastrointestinal: Positive for abdominal pain and nausea. Negative for constipation, diarrhea and vomiting.  Genitourinary: Negative for dysuria and hematuria.  Musculoskeletal: Negative for back pain, falls and myalgias.  Skin: Negative for rash.  Neurological: Positive for tremors and weakness. Negative for dizziness, sensory change, speech change, focal weakness, seizures and headaches.  Endo/Heme/Allergies: Does not bruise/bleed easily.  Psychiatric/Behavioral: Negative for depression and memory loss. The patient is nervous/anxious.     DRUG ALLERGIES:  No Known Allergies  VITALS:  Blood pressure 113/64, pulse 74, temperature 98.4 F (36.9 C), temperature source Oral, resp. rate 18, height 5\' 8"  (1.727 m), weight 95.9 kg (211 lb 6.7 oz), SpO2 97 %.  PHYSICAL EXAMINATION:   Physical Exam  GENERAL:  37 y.o.-year-old patient lying in the bed EYES: Pupils equal, round, reactive to light and accommodation. No scleral icterus. Extraocular muscles intact.  HEENT: Head atraumatic, normocephalic. Oropharynx and nasopharynx clear.  NECK:  Supple, no jugular venous distention. No thyroid  enlargement, no tenderness.  LUNGS: Normal breath sounds bilaterally, no wheezing, rales, rhonchi. No use of accessory muscles of respiration.  CARDIOVASCULAR: S1, S2 normal. No murmurs, rubs, or gallops.  ABDOMEN: Soft, mid and lower abdominal tenderness, nondistended. Bowel sounds present. No organomegaly or mass.  EXTREMITIES: No cyanosis, clubbing. NEUROLOGIC: Cranial nerves II through XII are intact. No focal Motor or sensory deficits b/l.   PSYCHIATRIC: The patient is alert and oriented x 3.  Anxious SKIN: No obvious rash, lesion, or ulcer.   LABORATORY PANEL:   CBC Recent Labs  Lab 10/13/17 0515  WBC 13.2*  HGB 10.9*  HCT 32.5*  PLT 122*   ------------------------------------------------------------------------------------------------------------------ Chemistries  Recent Labs  Lab 10/14/17 0341  NA 134*  K 3.1*  CL 104  CO2 24  GLUCOSE 97  BUN 6  CREATININE 0.59*  CALCIUM 8.1*  AST 124*  ALT 49  ALKPHOS 145*  BILITOT 13.1*   ------------------------------------------------------------------------------------------------------------------  Cardiac Enzymes No results for input(s): TROPONINI in the last 168 hours. ------------------------------------------------------------------------------------------------------------------  RADIOLOGY:  No results found.   ASSESSMENT AND PLAN:    37 year old male admitted for acute alcoholic hepatitis/alcohol abuse  *Acute alcoholic hepatitis over alcoholic cirrhosis.  On IV steroids.  Will request GI to see.  Repeat LFTs in the morning.  High risk for complications.  Poor prognosis.  *Mild leukocytosis likely due to hemoconcentration.  Repeat WBC count in the morning.  No signs of infection.  *Mild thrombocytopenia due to alcohol abuse.  Will repeat platelet count tomorrow.  * Alcohol withdrawal.  Patient is on Siva protocol.  Scheduled Ativan.  *DVT prophylaxis with SCDs  All the records are reviewed and case  discussed with Care Management/Social Worker Management plans  discussed with the patient, family and they are in agreement.  CODE STATUS: FULL CODE  DVT Prophylaxis: SCDs  TOTAL TIME TAKING CARE OF THIS PATIENT: 40 minutes.   POSSIBLE D/C IN 1-2 DAYS, DEPENDING ON CLINICAL CONDITION.  Molinda BailiffSrikar R Tayona Sarnowski M.D on 10/14/2017 at 3:33 PM  Between 7am to 6pm - Pager - [phone]  After 6pm go to [email or web address] - password [email or web address]  [facility]  Office  [phone]  CC: Primary care physician; Patient, No Pcp Per  Note: This dictation was prepared with Dragon dictation along with smaller phrase technology. Any transcriptional errors that result from this process are unintentional.

## 2017-10-14 NOTE — Progress Notes (Signed)
Patient stated pain level is a 7/10, administered prn norco for lower abdominal pain. Discussed with Dr. Elpidio AnisSudini of abdominal discomfort level, received order for ultrasound of the abdomen.

## 2017-10-15 ENCOUNTER — Inpatient Hospital Stay: Payer: Medicaid Other

## 2017-10-15 LAB — COMPREHENSIVE METABOLIC PANEL
ALT: 45 U/L (ref 17–63)
AST: 109 U/L — ABNORMAL HIGH (ref 15–41)
Albumin: 2.2 g/dL — ABNORMAL LOW (ref 3.5–5.0)
Alkaline Phosphatase: 137 U/L — ABNORMAL HIGH (ref 38–126)
Anion gap: 7 (ref 5–15)
BUN: 7 mg/dL (ref 6–20)
CHLORIDE: 110 mmol/L (ref 101–111)
CO2: 21 mmol/L — AB (ref 22–32)
CREATININE: 0.58 mg/dL — AB (ref 0.61–1.24)
Calcium: 8.3 mg/dL — ABNORMAL LOW (ref 8.9–10.3)
Glucose, Bld: 97 mg/dL (ref 65–99)
Potassium: 4 mmol/L (ref 3.5–5.1)
SODIUM: 138 mmol/L (ref 135–145)
Total Bilirubin: 14.8 mg/dL — ABNORMAL HIGH (ref 0.3–1.2)
Total Protein: 5.7 g/dL — ABNORMAL LOW (ref 6.5–8.1)

## 2017-10-15 LAB — CBC WITH DIFFERENTIAL/PLATELET
BASOS ABS: 0 10*3/uL (ref 0–0.1)
BASOS PCT: 1 %
EOS ABS: 0 10*3/uL (ref 0–0.7)
EOS PCT: 0 %
HCT: 28.1 % — ABNORMAL LOW (ref 40.0–52.0)
Hemoglobin: 9.6 g/dL — ABNORMAL LOW (ref 13.0–18.0)
LYMPHS ABS: 0.6 10*3/uL — AB (ref 1.0–3.6)
LYMPHS PCT: 10 %
MCH: 32.4 pg (ref 26.0–34.0)
MCHC: 34.3 g/dL (ref 32.0–36.0)
MCV: 94.3 fL (ref 80.0–100.0)
MONO ABS: 0.4 10*3/uL (ref 0.2–1.0)
Monocytes Relative: 7 %
NEUTROS ABS: 4.9 10*3/uL (ref 1.4–6.5)
NEUTROS PCT: 82 %
PLATELETS: 67 10*3/uL — AB (ref 150–440)
RBC: 2.98 MIL/uL — ABNORMAL LOW (ref 4.40–5.90)
RDW: 18.3 % — AB (ref 11.5–14.5)
WBC: 5.9 10*3/uL (ref 3.8–10.6)

## 2017-10-15 LAB — MAGNESIUM: Magnesium: 1.4 mg/dL — ABNORMAL LOW (ref 1.7–2.4)

## 2017-10-15 MED ORDER — FUROSEMIDE 40 MG PO TABS
40.0000 mg | ORAL_TABLET | Freq: Once | ORAL | Status: AC
  Administered 2017-10-15: 13:00:00 40 mg via ORAL
  Filled 2017-10-15: qty 1

## 2017-10-15 MED ORDER — LORAZEPAM 2 MG/ML IJ SOLN
0.5000 mg | Freq: Four times a day (QID) | INTRAMUSCULAR | Status: DC | PRN
  Administered 2017-10-16 (×2): 0.5 mg via INTRAVENOUS
  Filled 2017-10-15 (×2): qty 1

## 2017-10-15 MED ORDER — OXYCODONE HCL 5 MG PO TABS
5.0000 mg | ORAL_TABLET | ORAL | Status: DC | PRN
  Administered 2017-10-15 – 2017-10-16 (×7): 5 mg via ORAL
  Filled 2017-10-15 (×7): qty 1

## 2017-10-15 MED ORDER — MAGNESIUM OXIDE 400 (241.3 MG) MG PO TABS
400.0000 mg | ORAL_TABLET | Freq: Every day | ORAL | Status: DC
  Administered 2017-10-16: 400 mg via ORAL
  Filled 2017-10-15: qty 1

## 2017-10-15 MED ORDER — ACETAMINOPHEN 325 MG PO TABS: 650.0000 mg | ORAL_TABLET | Freq: Four times a day (QID) | ORAL | Status: DC | PRN

## 2017-10-15 MED ORDER — MAGNESIUM SULFATE 4 GM/100ML IV SOLN
4.0000 g | Freq: Once | INTRAVENOUS | Status: AC
  Administered 2017-10-15: 4 g via INTRAVENOUS
  Filled 2017-10-15: qty 100

## 2017-10-15 NOTE — Clinical Social Work Note (Signed)
CSW received consult for ETOH assessment and resources. CSW will assess when time permits.  Derrick PonderKaren Martha Alyxander Blanchard, MSW, Theresia MajorsLCSWA [phone]

## 2017-10-15 NOTE — Progress Notes (Signed)
Patient had a bright red bloody stool, notified Dr. Elpidio AnisSudini. GI consult already in for patient.

## 2017-10-15 NOTE — Progress Notes (Signed)
[facility] - [place] [facility]   PATIENT NAME: Derrick Blanchard    MR#:  [id number]  DATE OF BIRTH:  1981/02/27  SUBJECTIVE:  CHIEF COMPLAINT:   Chief Complaint  Patient presents with  . Abdominal Pain   Abd pain is present. No vomiting. Anxious. Some withdrawls  REVIEW OF SYSTEMS:    Review of Systems  Constitutional: Positive for malaise/fatigue. Negative for chills, fever and weight loss.  HENT: Negative for hearing loss and nosebleeds.   Eyes: Negative for blurred vision, double vision and pain.  Respiratory: Negative for cough, hemoptysis, sputum production, shortness of breath and wheezing.   Cardiovascular: Negative for chest pain, palpitations, orthopnea and leg swelling.  Gastrointestinal: Positive for abdominal pain and nausea. Negative for constipation, diarrhea and vomiting.  Genitourinary: Negative for dysuria and hematuria.  Musculoskeletal: Negative for back pain, falls and myalgias.  Skin: Negative for rash.  Neurological: Positive for tremors and weakness. Negative for dizziness, sensory change, speech change, focal weakness, seizures and headaches.  Endo/Heme/Allergies: Does not bruise/bleed easily.  Psychiatric/Behavioral: Negative for depression and memory loss. The patient is nervous/anxious.     DRUG ALLERGIES:  No Known Allergies  VITALS:  Blood pressure (!) 107/53, pulse 74, temperature 98.4 F (36.9 C), temperature source Oral, resp. rate 20, height 5\' 8"  (1.727 m), weight 95 kg (209 lb 7 oz), SpO2 99 %.  PHYSICAL EXAMINATION:   Physical Exam  GENERAL:  37 y.o.-year-old patient lying in the bed EYES: Pupils equal, round, reactive to light and accommodation. No scleral icterus. Extraocular muscles intact.  HEENT: Head atraumatic, normocephalic. Oropharynx and nasopharynx clear.  NECK:  Supple, no jugular venous distention. No thyroid enlargement, no tenderness.  LUNGS: Normal breath sounds bilaterally, no wheezing, rales,  rhonchi. No use of accessory muscles of respiration.  CARDIOVASCULAR: S1, S2 normal. No murmurs, rubs, or gallops.  ABDOMEN: Soft, mid and lower abdominal tenderness, nondistended. Bowel sounds present. No organomegaly or mass.  EXTREMITIES: No cyanosis, clubbing. NEUROLOGIC: Cranial nerves II through XII are intact. No focal Motor or sensory deficits b/l.   PSYCHIATRIC: The patient is alert and oriented x 3.  Anxious SKIN: No obvious rash, lesion, or ulcer.   LABORATORY PANEL:   CBC Recent Labs  Lab 10/15/17 0327  WBC 5.9  HGB 9.6*  HCT 28.1*  PLT 67*   ------------------------------------------------------------------------------------------------------------------ Chemistries  Recent Labs  Lab 10/15/17 0327  NA 138  K 4.0  CL 110  CO2 21*  GLUCOSE 97  BUN 7  CREATININE 0.58*  CALCIUM 8.3*  MG 1.4*  AST 109*  ALT 45  ALKPHOS 137*  BILITOT 14.8*   ------------------------------------------------------------------------------------------------------------------  Cardiac Enzymes No results for input(s): TROPONINI in the last 168 hours. ------------------------------------------------------------------------------------------------------------------  RADIOLOGY:  Dg Chest 2 View  Result Date: 10/15/2017 CLINICAL DATA:  Wheezing.  Patient with cirrhosis. EXAM: CHEST  2 VIEW COMPARISON:  09/23/2017 and prior radiograph FINDINGS: The cardiomediastinal silhouette is unremarkable. Chronic peribronchial thickening again noted. There is no evidence of focal airspace disease, pulmonary edema, suspicious pulmonary nodule/mass, pleural effusion, or pneumothorax. No acute bony abnormalities are identified. IMPRESSION: No evidence of acute cardiopulmonary disease. Mild chronic peribronchial thickening. Electronically Signed   By: Harmon Pier M.D.   On: 10/15/2017 12:01   US Abdomen Complete  Result Date: 10/15/2017 CLINICAL DATA:  37 year old male with abdominal pain and  cirrhosis. EXAM: ABDOMEN ULTRASOUND COMPLETE COMPARISON:  09/28/2017 ultrasound and 09/23/2017 CT FINDINGS: Gallbladder: Cholelithiasis noted. There is no evidence of acute cholecystitis. Common bile  duct: Diameter: 5 mm. No intrahepatic or extrahepatic biliary dilatation identified. Liver: Heterogeneous and morphologic features of cirrhosis identified. No definite focal hepatic masses noted. Portal vein is patent on color Doppler imaging with normal direction of blood flow towards the liver. IVC: No abnormality visualized. Pancreas: Visualized portion unremarkable. Spleen: Marked splenomegaly is again noted with splenic volume of 1600 cc. No focal splenic lesions are identified. Right Kidney: Length: 13.7 cm. Echogenicity within normal limits. No mass or hydronephrosis visualized. Left Kidney: Length: 13.8 cm. Echogenicity within normal limits. No mass or hydronephrosis visualized. Abdominal aorta: No aneurysm visualized. Other findings: A small amount of ascites is noted. IMPRESSION: 1. Cirrhosis and marked splenomegaly again noted. No focal hepatic or splenic lesions. 2. Cholelithiasis without evidence of acute cholecystitis 3. Small amount of ascites Electronically Signed   By: Harmon PierJeffrey  Hu M.D.   On: 10/15/2017 09:07     ASSESSMENT AND PLAN:    37 year old male admitted for acute alcoholic hepatitis/alcohol abuse  *Acute alcoholic hepatitis over alcoholic cirrhosis.   DF-22. Doesn't need steroids.   Repeat LFTs in the morning.  High risk for complications.  Poor prognosis. US with cirrhosis and small ascitis. Stop IVF  *Mild leukocytosis likely due to hemoconcentration.  Repeat WBC count in the morning.  No signs of infection.  *Mild thrombocytopenia due to alcohol abuse.  Will repeat platelet count tomorrow.  * Alcohol withdrawal.  PRN ativan added.  Scheduled Ativan.  *DVT prophylaxis with SCDs  All the records are reviewed and case discussed with Care Management/Social Worker Management  plans discussed with the patient, family and they are in agreement.  CODE STATUS: FULL CODE  DVT Prophylaxis: SCDs  TOTAL TIME TAKING CARE OF THIS PATIENT: 40 minutes.   POSSIBLE D/C IN 1-2 DAYS, DEPENDING ON CLINICAL CONDITION.  Molinda BailiffSrikar R Jaimy Kliethermes M.D on 10/15/2017 at 1:46 PM  Between 7am to 6pm - Pager - [phone]  After 6pm go to [email or web address] - password [email or web address]  [facility]  Office  [phone]  CC: Primary care physician; Patient, No Pcp Per  Note: This dictation was prepared with Dragon dictation along with smaller phrase technology. Any transcriptional errors that result from this process are unintentional.

## 2017-10-16 LAB — CBC WITH DIFFERENTIAL/PLATELET
BASOS ABS: 0.1 10*3/uL (ref 0–0.1)
BASOS PCT: 1 %
EOS PCT: 1 %
Eosinophils Absolute: 0.1 10*3/uL (ref 0–0.7)
HCT: 28.8 % — ABNORMAL LOW (ref 40.0–52.0)
Hemoglobin: 9.5 g/dL — ABNORMAL LOW (ref 13.0–18.0)
LYMPHS PCT: 6 %
Lymphs Abs: 0.5 10*3/uL — ABNORMAL LOW (ref 1.0–3.6)
MCH: 32 pg (ref 26.0–34.0)
MCHC: 33.1 g/dL (ref 32.0–36.0)
MCV: 96.6 fL (ref 80.0–100.0)
MONO ABS: 0.8 10*3/uL (ref 0.2–1.0)
Monocytes Relative: 10 %
Neutro Abs: 6.6 10*3/uL — ABNORMAL HIGH (ref 1.4–6.5)
Neutrophils Relative %: 82 %
PLATELETS: 72 10*3/uL — AB (ref 150–440)
RBC: 2.98 MIL/uL — AB (ref 4.40–5.90)
RDW: 18.1 % — AB (ref 11.5–14.5)
WBC: 8 10*3/uL (ref 3.8–10.6)

## 2017-10-16 LAB — COMPREHENSIVE METABOLIC PANEL
ALBUMIN: 2.3 g/dL — AB (ref 3.5–5.0)
ALT: 42 U/L (ref 17–63)
ANION GAP: 8 (ref 5–15)
AST: 99 U/L — AB (ref 15–41)
Alkaline Phosphatase: 139 U/L — ABNORMAL HIGH (ref 38–126)
BILIRUBIN TOTAL: 14.8 mg/dL — AB (ref 0.3–1.2)
BUN: 7 mg/dL (ref 6–20)
CALCIUM: 8.3 mg/dL — AB (ref 8.9–10.3)
CO2: 24 mmol/L (ref 22–32)
Chloride: 102 mmol/L (ref 101–111)
Creatinine, Ser: 0.58 mg/dL — ABNORMAL LOW (ref 0.61–1.24)
GFR calc Af Amer: >60 mL/min (ref >60)
GLUCOSE: 102 mg/dL — AB (ref 65–99)
POTASSIUM: 3.5 mmol/L (ref 3.5–5.1)
Sodium: 134 mmol/L — ABNORMAL LOW (ref 135–145)
TOTAL PROTEIN: 6.1 g/dL — AB (ref 6.5–8.1)

## 2017-10-16 MED ORDER — FUROSEMIDE 20 MG PO TABS: 20.0000 mg | ORAL_TABLET | Freq: Every day | ORAL | 0 refills | Status: DC

## 2017-10-16 MED ORDER — MAGNESIUM OXIDE 400 (241.3 MG) MG PO TABS: 400.0000 mg | ORAL_TABLET | Freq: Every day | ORAL | 0 refills | Status: DC

## 2017-10-16 MED ORDER — NALTREXONE HCL 50 MG PO TABS: 25.0000 mg | ORAL_TABLET | Freq: Every day | ORAL | 0 refills | Status: DC

## 2017-10-16 MED ORDER — POTASSIUM CHLORIDE 20 MEQ PO PACK: 20.0000 meq | PACK | Freq: Every day | ORAL | 0 refills | Status: DC

## 2017-10-16 MED ORDER — FUROSEMIDE 40 MG PO TABS
40.0000 mg | ORAL_TABLET | Freq: Once | ORAL | Status: AC
  Administered 2017-10-16: 09:00:00 40 mg via ORAL
  Filled 2017-10-16: qty 1

## 2017-10-16 MED ORDER — POTASSIUM CHLORIDE CRYS ER 20 MEQ PO TBCR
40.0000 meq | EXTENDED_RELEASE_TABLET | Freq: Once | ORAL | Status: AC
  Administered 2017-10-16: 40 meq via ORAL
  Filled 2017-10-16: qty 2

## 2017-10-16 MED ORDER — OXYCODONE HCL 5 MG PO TABS: 5.0000 mg | ORAL_TABLET | Freq: Four times a day (QID) | ORAL | 0 refills | Status: DC | PRN

## 2017-10-16 NOTE — Clinical Social Work Note (Signed)
Clinical Social Work Assessment  Patient Details  Name: Derrick Blanchard MRN: [id number] Date of Birth: 1981-03-03  Date of referral:  10/16/17               Reason for consult:  [employer], Substance Use/ETOH Abuse                Permission sought to share information with:    Permission granted to share information::     Name::        Agency::     Relationship::     Contact Information:     Housing/Transportation Living arrangements for the past 2 months:  Single Family Home Source of Information:  Patient Patient Interpreter Needed:  None Criminal Activity/Legal Involvement Pertinent to Current Situation/Hospitalization:  No - Comment as needed Significant Relationships:  Other Family Members Lives with:  Other (Comment)(Cousin) Do you feel safe going back to the place where you live?  Yes Need for family participation in patient care:  Yes (Comment)  Care giving concerns: Patient lives in [place] with [name] [phone]).  Social Worker assessment / plan:  Holiday representative (CSW) received ETOH consult. CSW is familiar with patient from previous admissions. CSW met with patient on 09/26/17 and provided substance abuse resources, and patient did not follow up. CSW met with patient today to address consult. Patient was alert and oriented X4 however he was very guarded with his answers and did not make eye contact with CSW and stayed under the covers of his bed. CSW introduced self and explained role of CSW department. Patient reported that he is still living with his cousin in [place] and has not followed up with [facility] and would not give a reason why. CSW provided patient with a list of outpatient substance abuse resources again and provided emotional support. Patient accepted resources and thanked CSW for stopping by. Please reconsult if future social work needs arise. CSW signing off.   Employment status:  Disabled (Comment on whether or  not currently receiving Disability) Insurance information:  Medicaid [place] PT Recommendations:  Not assessed at this time Information / Referral to community resources:  Outpatient Substance Abuse Treatment Options  Patient/Family's Response to care:  Patient accepted resources.   Patient/Family's Understanding of and Emotional Response to Diagnosis, Current Treatment, and Prognosis:  Patient was guarded with his answers and did not appear motivated to change his behavior or seek substance abuse treatment.   Emotional Assessment Appearance:  Appears stated age Attitude/Demeanor/Rapport:  Guarded, Avoidant Affect (typically observed):    Orientation:  Oriented to Self, Oriented to Place, Oriented to  Time, Oriented to Situation Alcohol / Substance use:  Alcohol Use Psych involvement (Current and /or in the community):  No (Comment)  Discharge Needs  Concerns to be addressed:  Discharge Planning Concerns Readmission within the last 30 days:  No Current discharge risk:  Substance Abuse Barriers to Discharge:  No Barriers Identified   Lashane Whelpley, Veronia Beets, [place] 10/16/2017, 5:24 PM

## 2017-10-16 NOTE — Discharge Instructions (Addendum)
Alcohol Withdrawal When a person who drinks a lot of alcohol stops drinking, he or she may go through alcohol withdrawal. Alcohol withdrawal causes problems. It can make you feel:  Tired (fatigued).  Sad (depressed).  Fearful (anxious).  Grouchy (irritable).  Not hungry.  Sick to your stomach (nauseous).  Shaky.  It can also make you have:  Nightmares.  Trouble sleeping.  Trouble thinking clearly.  Mood swings.  Clammy skin.  Very bad sweating.  A very fast heartbeat.  Shaking that you cannot control (tremor).  Having a fever.  A fit of movements that you cannot control (seizure).  Confusion.  Throwing up (vomiting).  Feeling or seeing things that are not there (hallucinations).  Follow these instructions at home:  Take medicines and vitamins only as told by your doctor.  Do not drink alcohol.  Have someone around in case you need help.  Drink enough fluid to keep your pee (urine) clear or pale yellow.  Think about joining a group to help you stop drinking. Contact a doctor if:  Your problems get worse.  Your problems do not go away.  You cannot eat or drink without throwing up.  You are having a hard time not drinking alcohol.  You cannot stop drinking alcohol. Get help right away if:  You feel your heart beating differently than usual.  Your chest hurts.  You have trouble breathing.  You have very bad problems, like: ? A fever. ? A fit of movements that you cannot control. ? Being very confused. ? Feeling or seeing things that are not there. This information is not intended to replace advice given to you by your health care provider. Make sure you discuss any questions you have with your health care provider. Document Released: 02/08/2008 Document Revised: 01/28/2016 Document Reviewed: 06/10/2014 Elsevier Interactive Patient Education  2018 [employer].   Alcohol Use Disorder Alcohol use disorder is when your drinking  disrupts your daily life. When you have this condition, you drink too much alcohol and you cannot control your drinking. Alcohol use disorder can cause serious problems with your physical health. It can affect your brain, heart, liver, pancreas, immune system, stomach, and intestines. Alcohol use disorder can increase your risk for certain cancers and cause problems with your mental health, such as depression, anxiety, psychosis, delirium, and dementia. People with this disorder risk hurting themselves and others. What are the causes? This condition is caused by drinking too much alcohol over time. It is not caused by drinking too much alcohol only one or two times. Some people with this condition drink alcohol to cope with or escape from negative life events. Others drink to relieve pain or symptoms of mental illness. What increases the risk? You are more likely to develop this condition if:  You have a family history of alcohol use disorder.  Your culture encourages drinking to the point of intoxication, or makes alcohol easy to get.  You had a mood or conduct disorder in childhood.  You have been a victim of abuse.  You are an adolescent and: ? You have poor grades or difficulties in school. ? Your caregivers do not talk to you about saying no to alcohol, or supervise your activities. ? You are impulsive or you have trouble with self-control.  What are the signs or symptoms? Symptoms of this condition include:  Drinkingmore than you want to.  Drinking for longer than you want to.  Trying several times to drink less or to control  your drinking.  Spending a lot of time getting alcohol, drinking, or recovering from drinking.  Craving alcohol.  Having problems at work, at school, or at home due to drinking.  Having problems in relationships due to drinking.  Drinking when it is dangerous to drink, such as before driving a car.  Continuing to drink even though you know you might  have a physical or mental problem related to drinking.  Needing more and more alcohol to get the same effect you want from the alcohol (building up tolerance).  Having symptoms of withdrawal when you stop drinking. Symptoms of withdrawal include: ? Fatigue. ? Nightmares. ? Trouble sleeping. ? Depression. ? Anxiety. ? Fever. ? Seizures. ? Severe confusion. ? Feeling or seeing things that are not there (hallucinations). ? Tremors. ? Rapid heart rate. ? Rapid breathing. ? High blood pressure.  Drinking to avoid symptoms of withdrawal.  How is this diagnosed? This condition is diagnosed with an assessment. Your health care provider may start the assessment by asking three or four questions about your drinking. Your health care provider may perform a physical exam or do lab tests to see if you have physical problems resulting from alcohol use. She or he may refer you to a mental health professional for evaluation. How is this treated? Some people with alcohol use disorder are able to reduce their alcohol use to low-risk levels. Others need to completely quit drinking alcohol. When necessary, mental health professionals with specialized training in substance use treatment can help. Your health care provider can help you decide how severe your alcohol use disorder is and what type of treatment you need. The following forms of treatment are available:  Detoxification. Detoxification involves quitting drinking and using prescription medicines within the first week to help lessen withdrawal symptoms. This treatment is important for people who have had withdrawal symptoms before and for heavy drinkers who are likely to have withdrawal symptoms. Alcohol withdrawal can be dangerous, and in severe cases, it can cause death. Detoxification may be provided in a home, community, or primary care setting, or in a hospital or substance use treatment facility.  Counseling. This treatment is also called talk  therapy. It is provided by substance use treatment counselors. A counselor can address the reasons you use alcohol and suggest ways to keep you from drinking again or to prevent problem drinking. The goals of talk therapy are to: ? Find healthy activities and ways for you to cope with stress. ? Identify and avoid the things that trigger your alcohol use. ? Help you learn how to handle cravings.  Medicines.Medicines can help treat alcohol use disorder by: ? Decreasing alcohol cravings. ? Decreasing the positive feeling you have when you drink alcohol. ? Causing an uncomfortable physical reaction when you drink alcohol (aversion therapy).  Support groups. Support groups are led by people who have quit drinking. They provide emotional support, advice, and guidance.  These forms of treatment are often combined. Some people with this condition benefit from a combination of treatments provided by specialized substance use treatment centers. Follow these instructions at home:  Take over-the-counter and prescription medicines only as told by your health care provider.  Check with your health care provider before starting any new medicines.  Ask friends and family members not to offer you alcohol.  Avoid situations where alcohol is served, including gatherings where others are drinking alcohol.  Create a plan for what to do when you are tempted to use alcohol.  Find hobbies  or activities that you enjoy that do not include alcohol.  Keep all follow-up visits as told by your health care provider. This is important. How is this prevented?  If you drink, limit alcohol intake to no more than 1 drink a day for nonpregnant women and 2 drinks a day for men. One drink equals 12 oz of beer, 5 oz of wine, or 1 oz of hard liquor.  If you have a mental health condition, get treatment and support.  Do not give alcohol to adolescents.  If you are an adolescent: ? Do not drink alcohol. ? Do not be  afraid to say no if someone offers you alcohol. Speak up about why you do not want to drink. You can be a positive role model for your friends and set a good example for those around you by not drinking alcohol. ? If your friends drink, spend time with others who do not drink alcohol. Make new friends who do not use alcohol. ? Find healthy ways to manage stress and emotions, such as meditation or deep breathing, exercise, spending time in nature, listening to music, or talking with a trusted friend or family member. Contact a health care provider if:  You are not able to take your medicines as told.  Your symptoms get worse.  You return to drinking alcohol (relapse) and your symptoms get worse. Get help right away if:  You have thoughts about hurting yourself or others. If you ever feel like you may hurt yourself or others, or have thoughts about taking your own life, get help right away. You can go to your nearest emergency department or call:  Your local emergency services (911 in the [place]  A suicide crisis helpline, such as the National Suicide Prevention Lifeline at 8166737719. This is open 24 hours a day.  Summary  Alcohol use disorder is when your drinking disrupts your daily life. When you have this condition, you drink too much alcohol and you cannot control your drinking.  Treatment may include detoxification, counseling, medicine, and support groups.  Ask friends and family members not to offer you alcohol. Avoid situations where alcohol is served.  Get help right away if you have thoughts about hurting yourself or others. This information is not intended to replace advice given to you by your health care provider. Make sure you discuss any questions you have with your health care provider. Document Released: 09/29/2004 Document Revised: 05/19/2016 Document Reviewed: 05/19/2016 Elsevier Interactive Patient Education  2018 [employer]. Resume diet and activity as  before.  QUIT ALCOHOL

## 2017-10-16 NOTE — Progress Notes (Signed)
Discharge instructions along with home medications and follow up gone over with patient. He verbalized that he understood instructions. No prescriptions given to patient. IV removed. Pt being discharged home on room air, no distress noted. Shaylee Stanislawski S [place], RN 

## 2017-10-17 ENCOUNTER — Telehealth: Payer: Self-pay

## 2017-10-17 NOTE — Telephone Encounter (Signed)
Attempted to contact patient concerning returned mail of lab results.   No answer. Voicemail was for emergency contact person.

## 2017-10-18 NOTE — Discharge Summary (Signed)
[facility] - [place] [facility]   PATIENT NAME: Derrick Blanchard    MR#:  [id number]  DATE OF BIRTH:  03/29/1981  DATE OF ADMISSION:  10/13/2017 ADMITTING PHYSICIAN: Arnaldo Natal, MD  DATE OF DISCHARGE: 10/16/2017  6:30 PM  PRIMARY CARE PHYSICIAN: Patient, No Pcp Per   ADMISSION DIAGNOSIS:  Alcoholic hepatitis with ascites [K70.11] Alcoholic intoxication with complication (HCC) [F10.929]  DISCHARGE DIAGNOSIS:  Principal Problem:   Alcohol dependence (HCC) Active Problems:   Alcoholic hepatitis   SECONDARY DIAGNOSIS:   Past Medical History:  Diagnosis Date  . Cirrhosis (HCC)   . Hepatitis      ADMITTING HISTORY  Chief Complaint: Abdominal pain HPI: Patient with history of cirrhosis and alcohol abuse presents to the emergency department complaining of abdominal pain.  He states that he has had at least 12 beers and 4 shots of liquor today.  He reports abdomen has been hurting more and more over the last few days and that he recently awakens at night with nausea and vomiting.  In the emergency department patient was found to have severely elevated liver enzymes and bilirubin greater than previous levels which prompted the emergency department staff to call the hospitalist service for further management.    HOSPITAL COURSE:   *Acute alcoholic hepatitis over alcoholic cirrhosis.  Discriminant factor XX 2.  Does not not need steroids per GI discussion with Dr. Norma Fredrickson. Bilirubin continues to be high which is chronic. Patient has poor prognosis with mortality of 50% in the next 1 year.  He has been counseled multiple times to quit alcohol. Ultrasound of the abdomen showed cirrhosis and very small ascites.  Started on Lasix.  *Bright red blood per rectum is chronic due to hemorrhoidal bleeding.  Hemoglobin stable.  *Alcohol withdrawals.  Was on as needed Ativan during the hospital stay along with scheduled Ativan.  No withdrawals by the day of discharge  and will not need any further benzos.  Patient is high risk for readmission due to ongoing alcohol abuse.  High chance of mortality in the next 1 year.  CONSULTS OBTAINED:  Treatment Team:  Audery Amel, MD  DRUG ALLERGIES:  No Known Allergies  DISCHARGE MEDICATIONS:   Allergies as of 10/16/2017   No Known Allergies     Medication List    STOP taking these medications   amoxicillin-clavulanate 500-125 MG tablet Commonly known as:  AUGMENTIN   prednisoLONE 15 MG/5ML solution Commonly known as:  ORAPRED     TAKE these medications   furosemide 20 MG tablet Commonly known as:  LASIX Take 1 tablet (20 mg total) by mouth daily.   LORazepam 0.5 MG tablet Commonly known as:  ATIVAN Take 1 tablet (0.5 mg total) by mouth every 8 (eight) hours.   magnesium oxide 400 (241.3 Mg) MG tablet Commonly known as:  MAG-OX Take 1 tablet (400 mg total) by mouth daily.   metoprolol tartrate 25 MG tablet Commonly known as:  LOPRESSOR Take 0.5 tablets (12.5 mg total) by mouth 2 (two) times daily.   naltrexone 50 MG tablet Commonly known as:  DEPADE Take 0.5 tablets (25 mg total) by mouth daily.   oxyCODONE 5 MG immediate release tablet Commonly known as:  Oxy IR/ROXICODONE Take 1 tablet (5 mg total) by mouth every 6 (six) hours as needed for severe pain.   pantoprazole 40 MG tablet Commonly known as:  PROTONIX Take 1 tablet (40 mg total) by mouth daily.   potassium chloride 20 MEQ  packet Commonly known as:  KLOR-CON Take 20 mEq by mouth daily.       Today   VITAL SIGNS:  Blood pressure (!) 98/54, pulse 84, temperature 99.1 F (37.3 C), resp. rate 14, height 5\' 8"  (1.727 m), weight 92.3 kg (203 lb 8 oz), SpO2 99 %.  I/O:  No intake or output data in the 24 hours ending 10/18/17 1525  PHYSICAL EXAMINATION:  Physical Exam  GENERAL:  37 y.o.-year-old patient lying in the bed with no acute distress.  LUNGS: Normal breath sounds bilaterally, no wheezing, rales,rhonchi  or crepitation. No use of accessory muscles of respiration.  CARDIOVASCULAR: S1, S2 normal. No murmurs, rubs, or gallops.  ABDOMEN: Soft, non-tender, non-distended. Bowel sounds present. No organomegaly or mass.  NEUROLOGIC: Moves all 4 extremities. PSYCHIATRIC: The patient is alert and oriented x 3.  SKIN: No obvious rash, lesion, or ulcer.   DATA REVIEW:   CBC Recent Labs  Lab 10/16/17 0317  WBC 8.0  HGB 9.5*  HCT 28.8*  PLT 72*    Chemistries  Recent Labs  Lab 10/15/17 0327 10/16/17 0317  NA 138 134*  K 4.0 3.5  CL 110 102  CO2 21* 24  GLUCOSE 97 102*  BUN 7 7  CREATININE 0.58* 0.58*  CALCIUM 8.3* 8.3*  MG 1.4*  --   AST 109* 99*  ALT 45 42  ALKPHOS 137* 139*  BILITOT 14.8* 14.8*    Cardiac Enzymes No results for input(s): TROPONINI in the last 168 hours.  Microbiology Results  Results for orders placed or performed during the hospital encounter of 01/30/15  MRSA PCR Screening     Status: Abnormal   Collection Time: 01/31/15  9:45 AM  Result Value Ref Range Status   MRSA by PCR POSITIVE (A) NEGATIVE Final    Comment:        The GeneXpert MRSA Assay (FDA approved for NASAL specimens only), is one component of a comprehensive MRSA colonization surveillance program. It is not intended to diagnose MRSA infection nor to guide or monitor treatment for MRSA infections. CRITICAL RESULT CALLED TO, READ BACK BY AND VERIFIED WITH: ERIN MAYNOR ON 01/31/15 AT 1137 BY QSD     RADIOLOGY:  No results found.  Follow up with PCP in 1 week.  Management plans discussed with the patient, family and they are in agreement.  CODE STATUS:  Code Status History    Date Active Date Inactive Code Status Order ID Comments User Context   10/13/2017 08:33 10/16/2017 21:35 Full Code [id number]  Arnaldo Nataliamond, Michael S, MD Inpatient   09/28/2017 19:56 09/29/2017 23:46 Full Code [id number]  Adrian SaranMody, Sital, MD Inpatient   09/24/2017 02:44 09/26/2017 21:03 Full Code [id number]  Cammy CopaMaier, Angela,  MD Inpatient   01/31/2015 01:59 02/07/2015 17:39 Full Code [id number]  Crissie Figureseddy, Edavally N, MD Inpatient      TOTAL TIME TAKING CARE OF THIS PATIENT ON DAY OF DISCHARGE: more than 30 minutes.   Orie FishermanSrikar R Corderius Saraceni M.D on 10/18/2017 at 3:25 PM  Between 7am to 6pm - Pager - [phone]  After 6pm go to [email or web address] - password [email or web address]  [facility]  Office  [phone]  CC: Primary care physician; Patient, No Pcp Per  Note: This dictation was prepared with Dragon dictation along with smaller phrase technology. Any transcriptional errors that result from this process are unintentional.

## 2017-10-24 ENCOUNTER — Other Ambulatory Visit
Admission: RE | Admit: 2017-10-24 | Discharge: 2017-10-24 | Disposition: A | Discharge status: Home / Self Care | Payer: Medicaid Other | Source: Ambulatory Visit | Attending: Gastroenterology | Admitting: Gastroenterology

## 2017-10-24 ENCOUNTER — Ambulatory Visit (INDEPENDENT_AMBULATORY_CARE_PROVIDER_SITE_OTHER): Payer: Medicaid Other | Admitting: Gastroenterology

## 2017-10-24 ENCOUNTER — Encounter: Payer: Self-pay | Admitting: Gastroenterology

## 2017-10-24 VITALS — BP 101/58 | HR 81 | Temp 98.2°F | Ht 67.0 in | Wt 204.2 lb

## 2017-10-24 DIAGNOSIS — B1920 Unspecified viral hepatitis C without hepatic coma: Secondary | ICD-10-CM

## 2017-10-24 DIAGNOSIS — K7031 Alcoholic cirrhosis of liver with ascites: Secondary | ICD-10-CM | POA: Diagnosis not present

## 2017-10-24 DIAGNOSIS — B1710 Acute hepatitis C without hepatic coma: Secondary | ICD-10-CM

## 2017-10-24 LAB — CBC WITH DIFFERENTIAL/PLATELET
BASOS PCT: 0 %
Basophils Absolute: 0 10*3/uL (ref 0–0.1)
EOS ABS: 0.1 10*3/uL (ref 0–0.7)
Eosinophils Relative: 2 %
HEMATOCRIT: 28.6 % — AB (ref 40.0–52.0)
HEMOGLOBIN: 9.7 g/dL — AB (ref 13.0–18.0)
Lymphocytes Relative: 7 %
Lymphs Abs: 0.3 10*3/uL — ABNORMAL LOW (ref 1.0–3.6)
MCH: 33.3 pg (ref 26.0–34.0)
MCHC: 33.9 g/dL (ref 32.0–36.0)
MCV: 98.4 fL (ref 80.0–100.0)
Monocytes Absolute: 0.8 10*3/uL (ref 0.2–1.0)
Monocytes Relative: 18 %
NEUTROS ABS: 3.2 10*3/uL (ref 1.4–6.5)
Neutrophils Relative %: 73 %
Platelets: 73 10*3/uL — ABNORMAL LOW (ref 150–440)
RBC: 2.91 MIL/uL — AB (ref 4.40–5.90)
RDW: 17.3 % — ABNORMAL HIGH (ref 11.5–14.5)
WBC: 4.5 10*3/uL (ref 3.8–10.6)

## 2017-10-24 LAB — PROTIME-INR
INR: 1.63
Prothrombin Time: 19.2 seconds — ABNORMAL HIGH (ref 11.4–15.2)

## 2017-10-24 LAB — COMPREHENSIVE METABOLIC PANEL
ALT: 38 U/L (ref 17–63)
ANION GAP: 9 (ref 5–15)
AST: 104 U/L — ABNORMAL HIGH (ref 15–41)
Albumin: 2.3 g/dL — ABNORMAL LOW (ref 3.5–5.0)
Alkaline Phosphatase: 130 U/L — ABNORMAL HIGH (ref 38–126)
BUN: 7 mg/dL (ref 6–20)
CALCIUM: 7.9 mg/dL — AB (ref 8.9–10.3)
CHLORIDE: 104 mmol/L (ref 101–111)
CO2: 22 mmol/L (ref 22–32)
Creatinine, Ser: 0.48 mg/dL — ABNORMAL LOW (ref 0.61–1.24)
Glucose, Bld: 127 mg/dL — ABNORMAL HIGH (ref 65–99)
Potassium: 3.1 mmol/L — ABNORMAL LOW (ref 3.5–5.1)
Sodium: 135 mmol/L (ref 135–145)
Total Bilirubin: 16.1 mg/dL — ABNORMAL HIGH (ref 0.3–1.2)
Total Protein: 6.2 g/dL — ABNORMAL LOW (ref 6.5–8.1)

## 2017-10-24 NOTE — Addendum Note (Signed)
Addended by: Ardyth ManARTER, Sion Thane Z on: 10/24/2017 01:38 PM   Modules accepted: Orders

## 2017-10-24 NOTE — Addendum Note (Signed)
Addended by: Ardyth ManARTER, Rushton Early Z on: 10/24/2017 01:56 PM   Modules accepted: Orders

## 2017-10-24 NOTE — Progress Notes (Addendum)
[name] Alaa Eyerman MD, MRCP(U.K) [address]  [address]  [place], [place] [address]  Main: [phone]  Fax: [phone]   Primary Care Physician: Steele Sizerrissman, Mark A, MD  Primary Gastroenterologist:  Dr. [name] Dareld Mcauliffe   No chief complaint on file.   HPI: Derrick Blanchard is a 37 y.o. male    Summary of history : He is here today to see me as a hospital follow-up.  I initially came in contact with him on 09/28/2017 when he was admitted to the hospital with abnormal liver function tests.  He has a history of alcoholic hepatitis.  On that day in January 2019 he presented to the ER with alcoholic intoxication.  He was commenced on steroids for alcoholic hepatitis on 09/23/2017.  CT scan in January 2019 showed features of portal hypertension cirrhosis.  Nonspecific lesions on the liver requiring MRI cholelithiasis and splenomegaly were also noted.  On that admission he had underwent a right upper quadrant ultrasound for right upper quadrant pain which showed gallstones and pericholecystic fluid.  His common bile duct was not dilated on ultrasound.  HIDA scan was negative for cholecystitis.  I suggested an MRCP of the abdomen which was not done.  He was evaluated by general surgery and no surgical intervention was recommended.  And he was subsequently discharged.  He was readmitted on 10/14/2017 and seen by Dr. Norma Fredricksonoledo .  [facility].  He was recommended supportive therapy abdominal ultrasound for ascites and cessation of alcohol consumption.  Ultrasound on 10/15/2017 shows small amount of ascites cirrhosis and mild splenomegaly.  Labs on 10/16/2017 demonstrated a hemoglobin of 9.5 g with an MCV of 96.6 and a platelet count of 72 CMP demonstrated an albumin of 2.3 a total bilirubin of 14.8 which is down from 13.9-10 days prior.  Urine drug screen on 10/13/2017 demonstrated cannabinoids and opiates.  On 10/13/2017 his INR is 1.35.  While he was in hospital I  checked him for hepatitis C and his PCR confirms that he has chronic hepatitis C.  He is negative for hepatitis B surface antigen, core antibody and hepatitis B viral DNA not detected.  Hospital notes reviewed an summarized   Interval history   1//2018-  10/24/2016    Last drink of alcohol was around 10 days back. Cooks his own food, consumes a lot of sauces, some frozen meals. Smokes weed. No NSAID's, no tylenol.   Complains of swelling over the body    Current Outpatient Medications  Medication Sig Dispense Refill  . furosemide (LASIX) 20 MG tablet Take 1 tablet (20 mg total) by mouth daily. 30 tablet 0  . LORazepam (ATIVAN) 0.5 MG tablet Take 1 tablet (0.5 mg total) by mouth every 8 (eight) hours. 10 tablet 0  . magnesium oxide (MAG-OX) 400 (241.3 Mg) MG tablet Take 1 tablet (400 mg total) by mouth daily. 30 tablet 0  . metoprolol tartrate (LOPRESSOR) 25 MG tablet Take 0.5 tablets (12.5 mg total) by mouth 2 (two) times daily. 60 tablet 1  . naltrexone (DEPADE) 50 MG tablet Take 0.5 tablets (25 mg total) by mouth daily. 30 tablet 0  . oxyCODONE (OXY IR/ROXICODONE) 5 MG immediate release tablet Take 1 tablet (5 mg total) by mouth every 6 (six) hours as needed for severe pain. 20 tablet 0  . pantoprazole (PROTONIX) 40 MG tablet Take 1 tablet (40 mg total) by mouth daily. 30 tablet 0  . potassium chloride (KLOR-CON)  20 MEQ packet Take 20 mEq by mouth daily. 30 tablet 0   No current facility-administered medications for this visit.     Allergies as of 10/24/2017  . (No Known Allergies)    ROS:  General: Negative for anorexia, weight loss, fever, chills, fatigue, weakness. ENT: Negative for hoarseness, difficulty swallowing , nasal congestion. CV: Negative for chest pain, angina, palpitations, dyspnea on exertion, peripheral edema.  Respiratory: Negative for dyspnea at rest, dyspnea on exertion, cough, sputum, wheezing.  GI: See history of present illness. GU:  Negative for dysuria,  hematuria, urinary incontinence, urinary frequency, nocturnal urination.  Endo: Negative for unusual weight change.    Physical Examination:   There were no vitals taken for this visit.  General: Well-nourished, well-developed in no acute distress.  Eyes: No icterus. Conjunctivae pink. Mouth: Oropharyngeal mucosa moist and pink , no lesions erythema or exudate. Lungs: Clear to auscultation bilaterally. Non-labored. Heart: Regular rate and rhythm, no murmurs rubs or gallops.  Abdomen: Bowel sounds are normal, nontender, nondistended, no hepatosplenomegaly or masses, no abdominal bruits or hernia , no rebound or guarding.   Extremities: Lowe extremity edema. No clubbing or deformities. Neuro: Alert and oriented x 3.  Grossly intact. Skin: spider angiomas over the chest  Psych: Alert and cooperative, normal mood and affect.   Imaging Studies: Dg Chest 2 View  Result Date: 10/15/2017 CLINICAL DATA:  Wheezing.  Patient with cirrhosis. EXAM: CHEST  2 VIEW COMPARISON:  09/23/2017 and prior radiograph FINDINGS: The cardiomediastinal silhouette is unremarkable. Chronic peribronchial thickening again noted. There is no evidence of focal airspace disease, pulmonary edema, suspicious pulmonary nodule/mass, pleural effusion, or pneumothorax. No acute bony abnormalities are identified. IMPRESSION: No evidence of acute cardiopulmonary disease. Mild chronic peribronchial thickening. Electronically Signed   By: Harmon Pier M.D.   On: 10/15/2017 12:01   Nm Hepatobiliary Liver Func  Result Date: 09/29/2017 CLINICAL DATA:  37 year old male with right upper quadrant abdominal pain and nausea. Recent ultrasound demonstrating cholelithiasis and mild gallbladder wall thickening. Patient has a history of cirrhosis and hepatic dysfunction. EXAM: NUCLEAR MEDICINE HEPATOBILIARY IMAGING TECHNIQUE: Sequential images of the abdomen were obtained out to 60 minutes following intravenous administration of  radiopharmaceutical. Delayed images were obtained following the administration of 3.6 mg of IV morphine. RADIOPHARMACEUTICALS:  10.25 mCi Tc-27m  Choletec IV COMPARISON:  09/28/2017 ultrasound and CT FINDINGS: Slow and persistent hepatic uptake identified without focal abnormality. Gallbladder and bowel activity are not identified in the first hour. Intravenous morphine was then administered. On post morphine images, faint increasing gallbladder activity is noted. Small bowel activity is also identified. IMPRESSION: 1. Gallbladder activity identified after administration of morphine, compatible with cystic duct patency. No evidence of acute cholecystitis or CBD obstruction. 2. Slow and persistent hepatic uptake compatible with hepatic dysfunction. Electronically Signed   By: Harmon Pier M.D.   On: 09/29/2017 12:51   US Abdomen Complete  Result Date: 10/15/2017 CLINICAL DATA:  37 year old male with abdominal pain and cirrhosis. EXAM: ABDOMEN ULTRASOUND COMPLETE COMPARISON:  09/28/2017 ultrasound and 09/23/2017 CT FINDINGS: Gallbladder: Cholelithiasis noted. There is no evidence of acute cholecystitis. Common bile duct: Diameter: 5 mm. No intrahepatic or extrahepatic biliary dilatation identified. Liver: Heterogeneous and morphologic features of cirrhosis identified. No definite focal hepatic masses noted. Portal vein is patent on color Doppler imaging with normal direction of blood flow towards the liver. IVC: No abnormality visualized. Pancreas: Visualized portion unremarkable. Spleen: Marked splenomegaly is again noted with splenic volume of 1600 cc. No focal splenic  lesions are identified. Right Kidney: Length: 13.7 cm. Echogenicity within normal limits. No mass or hydronephrosis visualized. Left Kidney: Length: 13.8 cm. Echogenicity within normal limits. No mass or hydronephrosis visualized. Abdominal aorta: No aneurysm visualized. Other findings: A small amount of ascites is noted. IMPRESSION: 1. Cirrhosis  and marked splenomegaly again noted. No focal hepatic or splenic lesions. 2. Cholelithiasis without evidence of acute cholecystitis 3. Small amount of ascites Electronically Signed   By: Harmon Pier M.D.   On: 10/15/2017 09:07   Dg Abd 2 Views  Result Date: 09/28/2017 CLINICAL DATA:  Right upper quadrant abdominal pain, bilateral flank pain, nausea and vomiting. EXAM: ABDOMEN - 2 VIEW COMPARISON:  CT of the abdomen and pelvis on 09/23/2017 FINDINGS: No evidence of bowel obstruction, ileus or free air. No abnormal calcifications. Bony structures are unremarkable. No visualized soft tissue abnormalities. IMPRESSION: No acute findings. Electronically Signed   By: Irish Lack M.D.   On: 09/28/2017 07:43   US Abdomen Limited Ruq  Result Date: 09/28/2017 CLINICAL DATA:  Abdominal pain, history of cirrhosis EXAM: ULTRASOUND ABDOMEN LIMITED RIGHT UPPER QUADRANT COMPARISON:  CT 09/23/2017 FINDINGS: Gallbladder: Sludge and gallstones noted within the gallbladder. Largest gallstone 8 mm. There is borderline wall thickening at 3 mm. Small amount of pericholecystic fluid noted. Negative sonographic Murphy's sign. Common bile duct: Diameter: Normal caliber, 4 mm Liver: Markedly heterogeneous echotexture throughout the liver. Subtle nodularity of the contours. Findings compatible with cirrhosis. Portal vein is patent with direction of flow toward the liver. Portal vein however is prominent/enlarged. There appears to be a recanalized umbilical vein. Findings compatible with portal venous hypertension. IMPRESSION: Sludge and gallstones noted within the gallbladder. Gallbladder wall thickening and pericholecystic fluid noted without sonographic Murphy sign. These findings could be related to cholecystitis or intrinsic liver disease. Recommend clinical correlation. Changes of cirrhosis within the liver with portal venous hypertension. Electronically Signed   By: Charlett Nose M.D.   On: 09/28/2017 11:11    Assessment and  Plan:   ONDRA DEBOARD is a 37 y.o. y/o male here to follow up for severe alcoholic hepatitis. Quit alcohol 10 days back , Has ascites, hepatitis C treatment naive. Very high mortality rate. Strongly advised to quit alcohol. No encephelopathy, no h/o variceal bleeds. Diet high in salt/sodium. Abnormal area on the liver seen which needs further evaluaiton   Plan : 1.  MRI liver mass protocol to evaluate abnormal lesion seen on CT scan previously. 2.  Complete alcohol cessation. 3.  Overall very poor prognosis with history of alcoholic hepatitis, ascites. 4.  He has chronic hepatitis C and usually treatment would be warranted but with decompensated liver disease as well as alcohol consumption to last recent in January 2019 he would not be a candidate for treatment at this point of time.  If treatment were to be considered in the future he needs to be having treatment at [facility] or [facility] as he is back very high risk for liver decompensation when treated for chronic hepatitis C. 5  He would need an EGD to be performed for screening of esophageal varices when he is a bit better. 6. Low salt diet  7. Needs a PCP  8. Check Hepatitis A/B serology for vaccination. Tdap , penumonia nad flu shot with PCP  9. Daily weights 10. Check CBC, CMP.INR 11. Refer to Palliative care to discuss advanced directives.   Dr Wyline Mood  MD,MRCP [facility]) Follow up in 4 weeks

## 2017-10-25 ENCOUNTER — Encounter: Payer: Self-pay | Admitting: Unknown Physician Specialty

## 2017-10-25 ENCOUNTER — Ambulatory Visit (INDEPENDENT_AMBULATORY_CARE_PROVIDER_SITE_OTHER): Payer: Medicaid Other | Admitting: Unknown Physician Specialty

## 2017-10-25 DIAGNOSIS — F1029 Alcohol dependence with unspecified alcohol-induced disorder: Secondary | ICD-10-CM

## 2017-10-25 DIAGNOSIS — D696 Thrombocytopenia, unspecified: Secondary | ICD-10-CM | POA: Diagnosis not present

## 2017-10-25 DIAGNOSIS — Z7189 Other specified counseling: Secondary | ICD-10-CM | POA: Diagnosis not present

## 2017-10-25 DIAGNOSIS — F321 Major depressive disorder, single episode, moderate: Secondary | ICD-10-CM

## 2017-10-25 DIAGNOSIS — K7011 Alcoholic hepatitis with ascites: Secondary | ICD-10-CM | POA: Diagnosis not present

## 2017-10-25 DIAGNOSIS — E876 Hypokalemia: Secondary | ICD-10-CM | POA: Diagnosis not present

## 2017-10-25 DIAGNOSIS — D530 Protein deficiency anemia: Secondary | ICD-10-CM | POA: Diagnosis not present

## 2017-10-25 LAB — HEPATITIS B E ANTIGEN: Hep B E Ag: NEGATIVE

## 2017-10-25 LAB — HEPATITIS B SURFACE ANTIBODY, QUANTITATIVE: Hepatitis B-Post: 75.8 m[IU]/mL (ref >9.9)

## 2017-10-25 LAB — HEPATITIS A ANTIBODY, TOTAL: Hep A Total Ab: POSITIVE — AB

## 2017-10-25 LAB — HCV RT-PCR, QUANT (GRAPH)
HCV QUANT: 110 [IU]/mL (ref >50)
HCV Quantitative Log: 2.041 log10 IU/mL (ref >1.70)

## 2017-10-25 MED ORDER — LORAZEPAM 0.5 MG PO TABS: 0.5000 mg | ORAL_TABLET | Freq: Two times a day (BID) | ORAL | 0 refills | Status: DC | PRN

## 2017-10-25 MED ORDER — CITALOPRAM HYDROBROMIDE 20 MG PO TABS: 20.0000 mg | ORAL_TABLET | Freq: Every day | ORAL | 3 refills | Status: DC

## 2017-10-25 NOTE — Assessment & Plan Note (Signed)
Consistent with alcoholic cirrhosis

## 2017-10-25 NOTE — Progress Notes (Addendum)
BP 110/63   Pulse 70   Temp 98.3 F (36.8 C) (Oral)   Ht 5\' 7"  (1.702 m)   Wt 206 lb 6.4 oz (93.6 kg)   SpO2 99%   BMI 32.33 kg/m    Subjective:    Patient ID: Derrick Blanchard, male    DOB: 1980/09/15, 37 y.o.   MRN: [id number]  HPI: Derrick Blanchard is a 37 y.o. male  Chief Complaint  Patient presents with  . Establish Care   Hepatitis Reviewed notes from Dr. Darien RamusAna.  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  Unable to be treated due to compormised liver function.  Last alcoholic drink was 10 days ago.   Treatment through [facility].   States he was told to "have a chance" he needs to quit drinking.  Recent CBC, CMP with Dr Darien RamusAna.  Anemia and low platlet count consistent with cirrhosis. Low K and is on replacement.   Pt complains of significant abdominal pain related to his liver disease.  Treated with narcotic pain medications in the past.    Depression States he is unable to sleep.  States he had been on Oxycodone and Lorazepam for abdominal pain and Lorazepam.  States he needs the medication and would like a refill.   Depression screen PHQ 2/9 10/25/2017  Decreased Interest 1  Down, Depressed, Hopeless 1  PHQ - 2 Score 2  Altered sleeping 3  Tired, decreased energy 1  Change in appetite 1  Feeling bad or failure about yourself  1  Trouble concentrating 1  Moving slowly or fidgety/restless 2  Suicidal thoughts 0  PHQ-9 Score 11   Family History  Problem Relation Age of Onset  . Lung cancer Mother   . Cirrhosis Father   . Lung cancer Paternal Grandfather    Social History   Socioeconomic History  . Marital status: Single    Spouse name: Not on file  . Number of children: Not on file  . Years of education: Not on file  . Highest education level: Not on file  Social Needs  . Financial resource strain: Not on file  . Food insecurity - worry: Not on file  . Food insecurity - inability: Not on file  . Transportation needs - medical: Not  on file  . Transportation needs - non-medical: Not on file  Occupational History  . Not on file  Tobacco Use  . Smoking status: Current Some Day Smoker    Packs/day: 0.25    Types: Cigarettes  . Smokeless tobacco: Never Used  Substance and Sexual Activity  . Alcohol use: No    Frequency: Never    Comment: 11 days  . Drug use: Yes    Types: Marijuana  . Sexual activity: Yes  Other Topics Concern  . Not on file  Social History Narrative  . Not on file   Past Medical History:  Diagnosis Date  . Anemia   . Anxiety   . Cirrhosis (HCC)   . Depression   . Heart murmur   . Hepatitis   . Hypertension   . Sleep apnea   . Substance abuse [facility](HCC)    Past Surgical History:  Procedure Laterality Date  . ESOPHAGOGASTRODUODENOSCOPY (EGD) WITH PROPOFOL N/A 02/03/2015   Procedure: ESOPHAGOGASTRODUODENOSCOPY (EGD) WITH PROPOFOL;  Surgeon: Elnita MaxwellMatthew Gordon Rein, MD;  [facility];  Service: Endoscopy;  Laterality: N/A;    Relevant past medical, surgical, family and social history reviewed and updated as indicated.  Interim medical history since our last visit reviewed. Allergies and medications reviewed and updated.  Review of Systems  Constitutional: Positive for fatigue.  HENT: Negative.   Respiratory: Negative.   Cardiovascular: Negative.   Psychiatric/Behavioral: Negative.     Per HPI unless specifically indicated above     Objective:    BP 110/63   Pulse 70   Temp 98.3 F (36.8 C) (Oral)   Ht 5\' 7"  (1.702 m)   Wt 206 lb 6.4 oz (93.6 kg)   SpO2 99%   BMI 32.33 kg/m   Wt Readings from Last 3 Encounters:  10/25/17 206 lb 6.4 oz (93.6 kg)  10/24/17 204 lb 3.2 oz (92.6 kg)  10/16/17 203 lb 8 oz (92.3 kg)    Physical Exam  Constitutional: He is oriented to person, place, and time. He appears well-developed and well-nourished. He has a sickly appearance. No distress.  Jaundice  HENT:  Head: Normocephalic and atraumatic.  Eyes: Conjunctivae and lids are  normal. Right eye exhibits no discharge. Left eye exhibits no discharge. No scleral icterus.  Neck: Normal range of motion. Neck supple. No JVD present. Carotid bruit is not present.  Cardiovascular: Normal rate, regular rhythm and normal heart sounds.  Pulmonary/Chest: Effort normal and breath sounds normal. No respiratory distress.  Abdominal: Normal appearance. There is hepatosplenomegaly, splenomegaly and hepatomegaly. There is tenderness in the right upper quadrant. There is no rebound and no CVA tenderness.  Musculoskeletal: Normal range of motion.  Neurological: He is alert and oriented to person, place, and time.  Skin: Skin is warm, dry and intact. No rash noted. No pallor.  Psychiatric: He has a normal mood and affect. His behavior is normal. Judgment and thought content normal.    Results for orders placed or performed during the hospital encounter of 10/24/17  Hepatitis B E antigen  Result Value Ref Range   Hep B E Ag Negative Negative  Hepatitis B surface antibody  Result Value Ref Range   Hepatitis B-Post 75.8 Immunity>9.9 mIU/mL  Hepatitis A antibody, total  Result Value Ref Range   Hep A Total Ab Positive (A) Negative  Protime-INR  Result Value Ref Range   Prothrombin Time 19.2 (H) 11.4 - 15.2 seconds   INR 1.63   Comprehensive metabolic panel  Result Value Ref Range   Sodium 135 135 - 145 mmol/L   Potassium 3.1 (L) 3.5 - 5.1 mmol/L   Chloride 104 101 - 111 mmol/L   CO2 22 22 - 32 mmol/L   Glucose, Bld 127 (H) 65 - 99 mg/dL   BUN 7 6 - 20 mg/dL   Creatinine, Ser 1.61 (L) 0.61 - 1.24 mg/dL   Calcium 7.9 (L) 8.9 - 10.3 mg/dL   Total Protein 6.2 (L) 6.5 - 8.1 g/dL   Albumin 2.3 (L) 3.5 - 5.0 g/dL   AST 096 (H) 15 - 41 U/L   ALT 38 17 - 63 U/L   Alkaline Phosphatase 130 (H) 38 - 126 U/L   Total Bilirubin 16.1 (H) 0.3 - 1.2 mg/dL   GFR calc non Af Amer >60 >60 mL/min   GFR calc Af Amer >60 >60 mL/min   Anion gap 9 5 - 15  CBC with Differential/Platelet  Result  Value Ref Range   WBC 4.5 3.8 - 10.6 K/uL   RBC 2.91 (L) 4.40 - 5.90 MIL/uL   Hemoglobin 9.7 (L) 13.0 - 18.0 g/dL   HCT 04.5 (L) 40.9 - 81.1 %   MCV 98.4 80.0 -  100.0 fL   MCH 33.3 26.0 - 34.0 pg   MCHC 33.9 32.0 - 36.0 g/dL   RDW 91.4 (H) 78.2 - 95.6 %   Platelets 73 (L) 150 - 440 K/uL   Neutrophils Relative % 73 %   Neutro Abs 3.2 1.4 - 6.5 K/uL   Lymphocytes Relative 7 %   Lymphs Abs 0.3 (L) 1.0 - 3.6 K/uL   Monocytes Relative 18 %   Monocytes Absolute 0.8 0.2 - 1.0 K/uL   Eosinophils Relative 2 %   Eosinophils Absolute 0.1 0 - 0.7 K/uL   Basophils Relative 0 %   Basophils Absolute 0.0 0 - 0.1 K/uL      Assessment & Plan:   Problem List Items Addressed This Visit      Unprioritized   Advance care planning    A voluntary discussion about advance care planning including the explanation and discussion of advance directives was extensively discussed  with the patient.  Explanation about the health care proxy and Living will was reviewed and packet with forms with explanation of how to fill them out was given.  During this discussion, the patient was able to identify a health care proxy as his cousin and plans to fill out the paperwork required.  Patient was offered a separate Advance Care Planning visit for further assistance with forms.  Pt tells me he wants full teatment to "give me a chance."Time spent:  20 minutes      Individuals present: patient       Alcohol dependence (HCC)    No drinking for 11 days.  Will refill Lorazepam to help maintain sobriety.  Refusing AA.  Was given Naltexone at GI      Alcoholic hepatitis    Severe.  Managed through [facility]. Complaining of significant pain.  Refer to pain clinic      Relevant Orders   Ambulatory referral to Pain Clinic   Anemia    Consistent with alcoholic cirrhosis      Depression, major, single episode, moderate (HCC)    OK to refill Lorazepam for #30 only.  Rx for Citalopram with goal for no Lorazepam.  Discussed  maintaining sobriety.  I've explained to her that drugs of the SSRI class can have side effects such as weight gain, sexual dysfunction, insomnia, headache, nausea. These medications are generally effective at alleviating symptoms of anxiety and/or depression. Let me know if significant side effects do occur.        Relevant Medications   LORazepam (ATIVAN) 0.5 MG tablet   citalopram (CELEXA) 20 MG tablet   Hypokalemia    Recheck in 1 month      Thrombocytopenia (HCC)    Due to cirrhosis          Follow up plan: Return in about 4 weeks (around 11/22/2017).

## 2017-10-25 NOTE — Assessment & Plan Note (Addendum)
OK to refill Lorazepam for #30 only.  Rx for Citalopram with goal for no Lorazepam.  Discussed maintaining sobriety.  I've explained to her that drugs of the SSRI class can have side effects such as weight gain, sexual dysfunction, insomnia, headache, nausea. These medications are generally effective at alleviating symptoms of anxiety and/or depression. Let me know if significant side effects do occur.

## 2017-10-25 NOTE — Assessment & Plan Note (Signed)
Recheck in 1 month 

## 2017-10-25 NOTE — Assessment & Plan Note (Signed)
A voluntary discussion about advance care planning including the explanation and discussion of advance directives was extensively discussed  with the patient.  Explanation about the health care proxy and Living will was reviewed and packet with forms with explanation of how to fill them out was given.  During this discussion, the patient was able to identify a health care proxy as his cousin and plans to fill out the paperwork required.  Patient was offered a separate Advance Care Planning visit for further assistance with forms.  Pt tells me he wants full teatment to "give me a chance."Time spent:  20 minutes      Individuals present: patient

## 2017-10-25 NOTE — Assessment & Plan Note (Signed)
Severe.  Managed through [facility]. Complaining of significant pain.  Refer to pain clinic

## 2017-10-25 NOTE — Assessment & Plan Note (Signed)
Due to cirrhosis °

## 2017-10-25 NOTE — Assessment & Plan Note (Addendum)
No drinking for 11 days.  Will refill Lorazepam to help maintain sobriety.  Refusing AA.  Was given Naltexone at GI

## 2017-10-26 LAB — HEPATITIS C GENOTYPE

## 2017-10-30 ENCOUNTER — Other Ambulatory Visit: Payer: Self-pay

## 2017-10-30 DIAGNOSIS — R1907 Generalized intra-abdominal and pelvic swelling, mass and lump: Secondary | ICD-10-CM

## 2017-10-30 NOTE — Progress Notes (Signed)
Patient to contact Central [employer] directly.  Patient shares phone with his brother.   Gave phone number to his brother for MRI of the Liver.

## 2017-11-16 ENCOUNTER — Ambulatory Visit: Payer: Medicaid Other

## 2017-11-21 ENCOUNTER — Ambulatory Visit: Payer: Medicaid Other | Admitting: Gastroenterology

## 2017-11-24 ENCOUNTER — Encounter: Payer: Self-pay | Admitting: Unknown Physician Specialty

## 2017-11-24 ENCOUNTER — Ambulatory Visit (INDEPENDENT_AMBULATORY_CARE_PROVIDER_SITE_OTHER): Payer: Medicaid Other | Admitting: Unknown Physician Specialty

## 2017-11-24 VITALS — BP 104/65 | HR 80 | Temp 98.1°F | Ht 67.0 in | Wt 193.8 lb

## 2017-11-24 DIAGNOSIS — K219 Gastro-esophageal reflux disease without esophagitis: Secondary | ICD-10-CM

## 2017-11-24 DIAGNOSIS — R000 Tachycardia, unspecified: Secondary | ICD-10-CM

## 2017-11-24 DIAGNOSIS — F1029 Alcohol dependence with unspecified alcohol-induced disorder: Secondary | ICD-10-CM

## 2017-11-24 DIAGNOSIS — F321 Major depressive disorder, single episode, moderate: Secondary | ICD-10-CM

## 2017-11-24 DIAGNOSIS — E876 Hypokalemia: Secondary | ICD-10-CM | POA: Diagnosis not present

## 2017-11-24 MED ORDER — CITALOPRAM HYDROBROMIDE 20 MG PO TABS
20.0000 mg | ORAL_TABLET | Freq: Every day | ORAL | 3 refills | Status: DC
Start: 2017-11-24 — End: 2023-03-14

## 2017-11-24 MED ORDER — LORAZEPAM 0.5 MG PO TABS: 0.5000 mg | ORAL_TABLET | Freq: Two times a day (BID) | ORAL | 0 refills | Status: DC | PRN

## 2017-11-24 NOTE — Progress Notes (Signed)
BP 104/65   Pulse 80   Temp 98.1 F (36.7 C) (Oral)   Ht 5\' 7"  (1.702 m)   Wt 193 lb 12.8 oz (87.9 kg)   SpO2 100%   BMI 30.35 kg/m    Subjective:    Patient ID: Derrick Blanchard, male    DOB: [birth year], 37 y.o.   MRN: [id number]  HPI: Derrick Blanchard is a 37 y.o. male  Chief Complaint  Patient presents with  . Depression    pt states the citalopram has been making him have strange dreams   Depression Side effects seems to be limited to dreaming.  Seems to be doing well.  Taking daily Lorazepam with occasional additional which he feels helps maintain sobriety.  No alcohol for about 6 week.  MRI is pending Depression screen [facility] 2/9 11/24/2017 10/25/2017  Decreased Interest 1 1  Down, Depressed, Hopeless 1 1  PHQ - 2 Score 2 2  Altered sleeping 1 3  Tired, decreased energy 1 1  Change in appetite 1 1  Feeling bad or failure about yourself  1 1  Trouble concentrating 1 1  Moving slowly or fidgety/restless 1 2  Suicidal thoughts 1 0  PHQ-9 Score 9 11   Abdominal pain Still hurts but perhaps some easing off.    ETOH Feels medications are working well to help with sobriety.  Taking Naltresone from GI.  Taking Citalopram from me.  Taking Lorazepam only about once a day.    GERD States this is a problem daily with a lot of acid on a regular basis.  Dr. Darien Ramus took him off Protonix  Tachycardia On metoprolol due to tachycardia, probably related to ETOH.  BP is low nomral   No problems or lightheadedness No chest pain with exertion or shortness of breath No Edema    Relevant past medical, surgical, family and social history reviewed and updated as indicated. Interim medical history since our last visit reviewed. Allergies and medications reviewed and updated.  Review of Systems  Constitutional: Positive for fatigue.  Respiratory: Negative.   Cardiovascular: Negative.     Per HPI unless specifically indicated above     Objective:    BP 104/65   Pulse 80    Temp 98.1 F (36.7 C) (Oral)   Ht 5\' 7"  (1.702 m)   Wt 193 lb 12.8 oz (87.9 kg)   SpO2 100%   BMI 30.35 kg/m   Wt Readings from Last 3 Encounters:  11/24/17 193 lb 12.8 oz (87.9 kg)  10/25/17 206 lb 6.4 oz (93.6 kg)  10/24/17 204 lb 3.2 oz (92.6 kg)    Physical Exam  Constitutional: He is oriented to person, place, and time. He appears well-developed and well-nourished. No distress.  HENT:  Head: Normocephalic and atraumatic.  Eyes: Conjunctivae and lids are normal. Right eye exhibits no discharge. Left eye exhibits no discharge. No scleral icterus.  Cardiovascular: Normal rate.  Pulmonary/Chest: Effort normal.  Abdominal: Normal appearance. There is no splenomegaly or hepatomegaly.  Musculoskeletal: Normal range of motion.  Neurological: He is alert and oriented to person, place, and time.  Skin: Skin is intact. No rash noted. No pallor.  Jaundice is improved  Psychiatric: He has a normal mood and affect. His behavior is normal. Judgment and thought content normal.    Results for orders placed or performed during the hospital encounter of 10/24/17  Hepatitis B E antigen  Result Value Ref Range   Hep B E Ag Negative Negative  Hepatitis B surface antibody  Result Value Ref Range   Hepatitis B-Post 75.8 Immunity>9.9 mIU/mL  Hepatitis A antibody, total  Result Value Ref Range   Hep A Total Ab Positive (A) Negative  Protime-INR  Result Value Ref Range   Prothrombin Time 19.2 (H) 11.4 - 15.2 seconds   INR 1.63   Comprehensive metabolic panel  Result Value Ref Range   Sodium 135 135 - 145 mmol/L   Potassium 3.1 (L) 3.5 - 5.1 mmol/L   Chloride 104 101 - 111 mmol/L   CO2 22 22 - 32 mmol/L   Glucose, Bld 127 (H) 65 - 99 mg/dL   BUN 7 6 - 20 mg/dL   Creatinine, Ser 1.610.48 (L) 0.61 - 1.24 mg/dL   Calcium 7.9 (L) 8.9 - 10.3 mg/dL   Total Protein 6.2 (L) 6.5 - 8.1 g/dL   Albumin 2.3 (L) 3.5 - 5.0 g/dL   AST 096104 (H) 15 - 41 U/L   ALT 38 17 - 63 U/L   Alkaline Phosphatase 130  (H) 38 - 126 U/L   Total Bilirubin 16.1 (H) 0.3 - 1.2 mg/dL   GFR calc non Af Amer >60 >60 mL/min   GFR calc Af Amer >60 >60 mL/min   Anion gap 9 5 - 15  CBC with Differential/Platelet  Result Value Ref Range   WBC 4.5 3.8 - 10.6 K/uL   RBC 2.91 (L) 4.40 - 5.90 MIL/uL   Hemoglobin 9.7 (L) 13.0 - 18.0 g/dL   HCT 04.528.6 (L) 40.940.0 - 81.152.0 %   MCV 98.4 80.0 - 100.0 fL   MCH 33.3 26.0 - 34.0 pg   MCHC 33.9 32.0 - 36.0 g/dL   RDW 91.417.3 (H) 78.211.5 - 95.614.5 %   Platelets 73 (L) 150 - 440 K/uL   Neutrophils Relative % 73 %   Neutro Abs 3.2 1.4 - 6.5 K/uL   Lymphocytes Relative 7 %   Lymphs Abs 0.3 (L) 1.0 - 3.6 K/uL   Monocytes Relative 18 %   Monocytes Absolute 0.8 0.2 - 1.0 K/uL   Eosinophils Relative 2 %   Eosinophils Absolute 0.1 0 - 0.7 K/uL   Basophils Relative 0 %   Basophils Absolute 0.0 0 - 0.1 K/uL  Hepatitis C genotype  Result Value Ref Range   HCV Genotype HCVTNP    Please Note (HCV): Comment   HCV RT-PCR, Quant (Graph)  Result Value Ref Range   HCV Quantitative 110 >50 IU/mL   HCV Quantitative Log 2.041 >1.70 log10 IU/mL   Test Information Comment       Assessment & Plan:   Problem List Items Addressed This Visit      Unprioritized   Alcohol dependence (HCC)    Continue Naltrexone.  OK for daily Lorazepam as combination medications are working for this young high risk patient      Depression, major, single episode, moderate (HCC)    Tolerable side effects from Citalopram.  Seems to be doing OK.  Maintained sobrieity according to pt report      Relevant Medications   citalopram (CELEXA) 20 MG tablet   LORazepam (ATIVAN) 0.5 MG tablet   GERD (gastroesophageal reflux disease)    Taken off Protonix by Dr Darien RamusAna.  Seeing her this month so will defer treatment to GI      Hypokalemia - Primary   Relevant Orders   Comprehensive metabolic panel   Tachycardia    Probably due to ETOH.  Will stop Metoprolol for low normal BP and  will follow response of heart rage.  80 today           Follow up plan: Return in about 2 months (around 01/24/2018).

## 2017-11-24 NOTE — Assessment & Plan Note (Signed)
Probably due to ETOH.  Will stop Metoprolol for low normal BP and will follow response of heart rage.  80 today

## 2017-11-24 NOTE — Assessment & Plan Note (Signed)
Continue Naltrexone.  OK for daily Lorazepam as combination medications are working for this young high risk patient

## 2017-11-24 NOTE — Assessment & Plan Note (Signed)
Taken off Protonix by Dr Darien RamusAna.  Seeing her this month so will defer treatment to GI

## 2017-11-24 NOTE — Assessment & Plan Note (Signed)
Tolerable side effects from Citalopram.  Seems to be doing OK.  Maintained sobrieity according to pt report

## 2017-11-25 ENCOUNTER — Other Ambulatory Visit: Payer: Self-pay | Admitting: Unknown Physician Specialty

## 2017-11-25 LAB — COMPREHENSIVE METABOLIC PANEL
A/G RATIO: 0.9 — AB (ref 1.2–2.2)
ALT: 18 IU/L (ref 0–44)
AST: 41 IU/L — AB (ref 0–40)
Albumin: 2.9 g/dL — ABNORMAL LOW (ref 3.5–5.5)
Alkaline Phosphatase: 112 IU/L (ref 39–117)
BILIRUBIN TOTAL: 4.1 mg/dL — AB (ref 0.0–1.2)
BUN / CREAT RATIO: 8 — AB (ref 9–20)
BUN: 5 mg/dL — ABNORMAL LOW (ref 6–20)
CO2: 21 mmol/L (ref 20–29)
Calcium: 8.3 mg/dL — ABNORMAL LOW (ref 8.7–10.2)
Chloride: 109 mmol/L — ABNORMAL HIGH (ref 96–106)
Creatinine, Ser: 0.63 mg/dL — ABNORMAL LOW (ref 0.76–1.27)
GFR calc Af Amer: 149 mL/min/{1.73_m2} (ref >59)
GFR calc non Af Amer: 129 mL/min/{1.73_m2} (ref >59)
GLOBULIN, TOTAL: 3.3 g/dL (ref 1.5–4.5)
Glucose: 89 mg/dL (ref 65–99)
Potassium: 2.9 mmol/L — ABNORMAL LOW (ref 3.5–5.2)
Sodium: 144 mmol/L (ref 134–144)
Total Protein: 6.2 g/dL (ref 6.0–8.5)

## 2017-11-25 MED ORDER — POTASSIUM CHLORIDE CRYS ER 20 MEQ PO TBCR: 20.0000 meq | EXTENDED_RELEASE_TABLET | Freq: Every day | ORAL | 3 refills | Status: DC

## 2017-11-27 ENCOUNTER — Telehealth: Payer: Self-pay | Admitting: Unknown Physician Specialty

## 2017-11-27 NOTE — Telephone Encounter (Signed)
Patient calling back for lab results.  See result note as information given / Patient also requested a refill on lasix and magnesium however these medications were not written by Gabriel Cirriheryl Wicker / LOV 11/24/17 / Does she want to refill these medications?

## 2017-11-27 NOTE — Telephone Encounter (Signed)
Copied from CRM [phone]. Topic: General - Other >> Nov 27, 2017  3:06 PM Percival SpanishKennedy, Cheryl W wrote:  Pt returned call for lab results    [phone]

## 2017-11-28 ENCOUNTER — Ambulatory Visit
Admission: RE | Admit: 2017-11-28 | Discharge: 2017-11-28 | Disposition: A | Discharge status: Home / Self Care | Payer: Medicaid Other | Source: Ambulatory Visit | Attending: Gastroenterology | Admitting: Gastroenterology

## 2017-11-28 DIAGNOSIS — K7460 Unspecified cirrhosis of liver: Secondary | ICD-10-CM | POA: Diagnosis not present

## 2017-11-28 DIAGNOSIS — R1907 Generalized intra-abdominal and pelvic swelling, mass and lump: Secondary | ICD-10-CM

## 2017-11-28 DIAGNOSIS — R161 Splenomegaly, not elsewhere classified: Secondary | ICD-10-CM | POA: Diagnosis not present

## 2017-11-28 DIAGNOSIS — K766 Portal hypertension: Secondary | ICD-10-CM | POA: Insufficient documentation

## 2017-11-28 DIAGNOSIS — I8500 Esophageal varices without bleeding: Secondary | ICD-10-CM | POA: Diagnosis not present

## 2017-11-28 MED ORDER — GADOBENATE DIMEGLUMINE 529 MG/ML IV SOLN
18.0000 mL | Freq: Once | INTRAVENOUS | Status: AC | PRN
  Administered 2017-11-28: 18 mL via INTRAVENOUS

## 2017-11-28 NOTE — Telephone Encounter (Signed)
Patient notified at 4:14 yesterday

## 2017-11-28 NOTE — Telephone Encounter (Signed)
Contact prescribing doctor for refills.

## 2017-11-28 NOTE — Telephone Encounter (Signed)
Call pt 

## 2017-11-28 NOTE — Telephone Encounter (Signed)
Done by hospitalist. Please advise.

## 2017-11-28 NOTE — Telephone Encounter (Signed)
Left message on machine for pt to return call to the office.  

## 2017-12-01 ENCOUNTER — Telehealth: Payer: Self-pay

## 2017-12-01 NOTE — Telephone Encounter (Signed)
Advised patient of MRI results per Dr. Tobi BastosAnna.    - MRI shows only cirrhosis   Reminded patient of appointment on 4/8.

## 2017-12-07 ENCOUNTER — Telehealth: Payer: Self-pay | Admitting: Unknown Physician Specialty

## 2017-12-07 MED ORDER — FUROSEMIDE 20 MG PO TABS
20.0000 mg | ORAL_TABLET | Freq: Every day | ORAL | 0 refills | Status: DC
Start: 2017-12-07 — End: 2017-12-13

## 2017-12-07 NOTE — Telephone Encounter (Signed)
The Magnesium is OTC.  Happy to rx the Lasix

## 2017-12-07 NOTE — Telephone Encounter (Signed)
Copied from CRM [phone]. Topic: General - Other >> Dec 07, 2017 12:35 PM Cecelia ByarsGreen, Temeka L, RMA wrote: Reason for CRM: Medication refill request for furosemide (LASIX) 20 MG table, and magnesium oxide (MAG-OX) 400 (241.3 Mg) MG tablet [facility] [place]

## 2017-12-07 NOTE — Telephone Encounter (Signed)
Pt requesting a refill on lasix (looks like it was discontinued on 11/15/17 by a historical provider). Also requesting a refill for magnesium oxide.   LOV  11/24/17 Provider  Gabriel Cirriheryl Wicker, NP Last refill for Magnesium oxide was 10/17/17 Last mag level was 1.4 on  0210/19  Pharmacy:  [facility] [address], [place] Please advise.

## 2017-12-11 ENCOUNTER — Ambulatory Visit: Payer: Medicaid Other | Admitting: Gastroenterology

## 2017-12-11 NOTE — Progress Notes (Deleted)
Summary of history : He is here today to follow up for alcoholic liver disease. Recent diagnosis with alcoholic hepatitis in January 2019, commenced on steroids for alcoholic hepatitis on 09/23/2017.  CT scan in January 2019 showed features of portal hypertension cirrhosis. Right upper quadrant ultrasound for right upper quadrant pain which showed gallstones and pericholecystic fluid.  HIDA scan was negative for cholecystitis.  He was evaluated by general surgery and no surgical intervention was recommended.  And he was subsequently discharged.  He was readmitted on 10/14/2017 and seen by Dr. Norma Blanchard .  He was recommended supportive therapy abdominal ultrasound for ascites and cessation of alcohol consumption.  Ultrasound on 10/15/2017 shows small amount of ascites cirrhosis and mild splenomegaly.  Urine drug screen on 10/13/2017 demonstrated cannabinoids and opiates. While he was in hospital I checked him for hepatitis C and his PCR confirms that he has chronic hepatitis C.  He is negative for hepatitis B surface antigen, core antibody and hepatitis B viral DNA not detected.    Interval history   1//2018-  10/24/2016   11/2017 : MRI: Hepatic cirrhosis , features of portal hypertension   Labs 11/24/2017 : Cr 0.63, albumin 2.9, Tbil 4.1, 1.63 INR. Hepatitis A ab - positive, Hep B ab-immune   Last drink of alcohol was around 10 days back. Cooks his own food, consumes a lot of sauces, some frozen meals. Smokes weed. No NSAID's, no tylenol.   Complains of swelling over the body      Derrick Blanchard is a 37 y.o. y/o male here to follow up for severe alcoholic hepatitis. Quit alcohol 10 days back , Has ascites, hepatitis C treatment naive. Very high mortality rate. Strongly advised to quit alcohol. No encephelopathy, no h/o variceal bleeds. Diet high in salt/sodium. Abnormal area on the liver seen which needs further evaluaiton . Per Labs 11/2017 - Childs B -8 , MELD 17   Plan : 1. Complete alcohol  cessation. 3. Overall very poor prognosis with history of alcoholic hepatitis, ascites. 4.  He has chronic hepatitis C and usually treatment would be warranted but with decompensated liver disease as well as alcohol consumption to last recent in January 2019 he would not be a candidate for treatment at this point of time.  If treatment were to be considered in the future he [facility] as he is back very high risk for liver decompensation when treated for chronic hepatitis C. 5  He would need an EGD to be performed for screening of esophageal varices when he is a bit better. 6. Low salt diet  7. Needs a PCP  8. Needs hep A vaccine  9. Daily weights 10.Refer to Palliative care to discuss advanced directives.

## 2017-12-13 ENCOUNTER — Ambulatory Visit (INDEPENDENT_AMBULATORY_CARE_PROVIDER_SITE_OTHER): Payer: Medicaid Other | Admitting: Gastroenterology

## 2017-12-13 ENCOUNTER — Encounter: Payer: Self-pay | Admitting: Gastroenterology

## 2017-12-13 VITALS — BP 96/59 | HR 73 | Temp 98.0°F | Resp 16 | Ht 67.0 in | Wt 192.4 lb

## 2017-12-13 DIAGNOSIS — K219 Gastro-esophageal reflux disease without esophagitis: Secondary | ICD-10-CM

## 2017-12-13 DIAGNOSIS — K7031 Alcoholic cirrhosis of liver with ascites: Secondary | ICD-10-CM | POA: Diagnosis not present

## 2017-12-13 MED ORDER — OMEPRAZOLE 40 MG PO CPDR: 40.0000 mg | DELAYED_RELEASE_CAPSULE | Freq: Every day | ORAL | 3 refills | Status: DC

## 2017-12-13 NOTE — Progress Notes (Signed)
Sent a message to PCP requesting patient have an advance directive completed and discuss palliative care. Not eligible for a liver transplant.

## 2017-12-13 NOTE — Progress Notes (Signed)
Derrick Mood MD, MRCP(U.K) [address]  [address]  [place], [place] [address]  Main: [phone]  Fax: [phone]   Primary Care Physician: Gabriel Cirri, NP  Primary Gastroenterologist:  Dr. Wyline Blanchard   Chief Complaint  Patient presents with  . Follow-up    HPI: Derrick Blanchard is a 37 y.o. male    Summary of history : He is here today follow up for alcoholic liver disease and hepatitis C.   I initially came in contact with him on 09/28/2017 when he was admitted to the hospital with abnormal liver function tests.  He has a history of alcoholic hepatitis.  On that day in January 2019 he presented to the ER with alcoholic intoxication.  He was commenced on steroids for alcoholic hepatitis on 09/23/2017.  CT scan in January 2019 showed features of portal hypertension and  cirrhosis.  Nonspecific lesions on the liver requiring MRI cholelithiasis and splenomegaly were also noted.  On that admission he had underwent a right upper quadrant ultrasound for right upper quadrant pain which showed gallstones and pericholecystic fluid.  His common bile duct was not dilated on ultrasound.  HIDA scan was negative for cholecystitis. He was evaluated by general surgery and no surgical intervention was recommended.  And he was subsequently discharged.  He was readmitted on 10/14/2017 .  He was recommended supportive therapy abdominal ultrasound for ascites and cessation of alcohol consumption.   PCR confirms that he has chronic hepatitis C.  He is negative for hepatitis B surface antigen, core antibody and hepatitis B viral DNA not detected.   Interval history 10/24/2016 -12/13/17  He is immune to Hep A/B  11/29/17 - Mri liver shows no masses .   Labs 10/2017 Hb 9.7, MCV 98, Cr 0.48 ,albumin 2.3 ,T bil 16.1 ,INR 1.63   No alcohol since 10/2017  . Cooks own food, no frozen meals, smoking weed, No NSAID. Memory ok On lasix 20 mg , no diarrhea  1-2 bowel movements day , Wakes up a lot during  the night  Lost 5 lb since last visit.   BP (!) 96/59   Pulse 73   Temp 98 F (36.7 C) (Oral)   Resp 16   Ht 5\' 7"  (1.702 m)   Wt 192 lb 6.4 oz (87.3 kg)   SpO2 98%   BMI 30.13 kg/m    Current Outpatient Medications  Medication Sig Dispense Refill  . citalopram (CELEXA) 20 MG tablet Take 1 tablet (20 mg total) by mouth daily. 30 tablet 3  . furosemide (LASIX) 20 MG tablet Take 1 tablet (20 mg total) by mouth daily. 30 tablet 0  . LORazepam (ATIVAN) 0.5 MG tablet Take 1 tablet (0.5 mg total) by mouth 2 (two) times daily as needed for anxiety. 30 tablet 0  . magnesium oxide (MAG-OX) 400 (241.3 Mg) MG tablet Take 1 tablet (400 mg total) by mouth daily. 30 tablet 0  . naltrexone (DEPADE) 50 MG tablet Take 0.5 tablets (25 mg total) by mouth daily. 30 tablet 0  . potassium chloride (KLOR-CON) 20 MEQ packet Take 20 mEq by mouth daily. 30 tablet 0  . potassium chloride SA (K-DUR,KLOR-CON) 20 MEQ tablet Take 1 tablet (20 mEq total) by mouth daily. 30 tablet 3  . pantoprazole (PROTONIX) 40 MG tablet Take 1 tablet (40 mg total) by mouth daily. (Patient not taking: Reported on 11/24/2017) 30 tablet 0   No current facility-administered medications for this visit.     Allergies as  of 12/13/2017  . (No Known Allergies)    ROS:  General: Negative for anorexia, weight loss, fever, chills, fatigue, weakness. ENT: Negative for hoarseness, difficulty swallowing , nasal congestion. CV: Negative for chest pain, angina, palpitations, dyspnea on exertion, peripheral edema.  Respiratory: Negative for dyspnea at rest, dyspnea on exertion, cough, sputum, wheezing.  GI: See history of present illness. GU:  Negative for dysuria, hematuria, urinary incontinence, urinary frequency, nocturnal urination.  Endo: Negative for unusual weight change.    Physical Examination:   BP (!) 96/59   Pulse 73   Temp 98 F (36.7 C) (Oral)   Resp 16   Ht 5\' 7"  (1.702 m)   Wt 192 lb 6.4 oz (87.3 kg)   SpO2 98%    BMI 30.13 kg/m   General: Well-nourished, well-developed in no acute distress.  Eyes: No icterus. Conjunctivae pink. Mouth: Oropharyngeal mucosa moist and pink , no lesions erythema or exudate. Lungs: Clear to auscultation bilaterally. Non-labored. Heart: Regular rate and rhythm, no murmurs rubs or gallops.  Abdomen: Bowel sounds are normal, nontender, nondistended, no hepatosplenomegaly or masses, no abdominal bruits or hernia , no rebound or guarding.   Extremities: No lower extremity edema. No clubbing or deformities. Neuro: Alert and oriented x 3.  Grossly intact. Skin: Warm and dry, no jaundice.   Psych: Alert and cooperative, normal Blanchard and affect.   Imaging Studies: Mr Liver W Wo Contrast  Result Date: 11/29/2017 CLINICAL DATA:  Cirrhosis. Right-sided abdominal and flank pain. Abdominal distention. EXAM: MRI ABDOMEN WITHOUT AND WITH CONTRAST TECHNIQUE: Multiplanar multisequence MR imaging of the abdomen was performed both before and after the administration of intravenous contrast. CONTRAST:  18mL MULTIHANCE GADOBENATE DIMEGLUMINE 529 MG/ML IV SOLN COMPARISON:  CT on 09/23/2017 FINDINGS: Lower chest: No acute findings. Hepatobiliary: Hepatic cirrhosis again demonstrated. No liver masses are identified. Recanalization of paraumbilical veins and prominent esophageal varices, consistent with portal venous hypertension. Gallbladder is unremarkable. Pancreas:  No mass or inflammatory changes. Spleen: Marked splenomegaly measuring 20 cm in length, consistent with portal venous hypertension. No splenic mass identified. Adrenals/Urinary Tract: No masses identified. No evidence of hydronephrosis. Stomach/Bowel: Visualized portion unremarkable. Vascular/Lymphatic: No pathologically enlarged lymph nodes identified. No abdominal aortic aneurysm. Other:  No evidence of ascites. Musculoskeletal:  No suspicious bone lesions identified. IMPRESSION: Hepatic cirrhosis. No radiographic evidence of  hepatocellular carcinoma or other acute findings. Findings of portal venous hypertension including recanalization of paraumbilical veins, esophageal varices, and marked splenomegaly Electronically Signed   By: Myles Rosenthal M.D.   On: 11/29/2017 07:36    Assessment and Plan:   Derrick Blanchard is a 37 y.o. y/o male here to follow up for severe alcoholic hepatitis. Quit alcohol in 10/2017  , Ascites has resolved ,  hepatitis C treatment naive.  No encephelopathy, no h/o variceal bleeds. CP B 9,  MELD 22  Plan : 1.  RUQ USG in 05/2018 to screen for HCC 2.  Complete alcohol cessation and to continue  3.   He has chronic hepatitis C and usually treatment would be warranted but with decompensated liver disease as well as alcohol consumption to last recent in January 2019 he would not be a candidate for treatment at this point of time.  If treatment were to be considered in the future he needs to be having treatment at [facility] or [facility] as he is back very high risk for liver decompensation when treated for chronic hepatitis C.Will re evaluate in 2-3 months  4 . EGD to  screen for esophageal varices.  5. Low salt diet  6 Tdap , penumonia nad flu shot with PCP  7. Daily weights 8Refer to Palliative care to discuss advanced directives    I have discussed alternative options, risks & benefits,  which include, but are not limited to, bleeding, infection, perforation,respiratory complication & drug reaction.  The patient agrees with this plan & written consent will be obtained.    Dr Derrick MoodKiran Jamison Yuhasz  MD,MRCP [facility](U.K) Follow up in 8 weeks

## 2017-12-21 ENCOUNTER — Ambulatory Visit
Admission: RE | Admit: 2017-12-21 | Discharge: 2017-12-21 | Disposition: A | Discharge status: Home / Self Care | Payer: Medicaid Other | Source: Ambulatory Visit | Attending: Gastroenterology | Admitting: Gastroenterology

## 2017-12-21 ENCOUNTER — Ambulatory Visit: Payer: Medicaid Other | Admitting: Anesthesiology

## 2017-12-21 ENCOUNTER — Other Ambulatory Visit: Payer: Self-pay

## 2017-12-21 ENCOUNTER — Encounter: Payer: Self-pay | Admitting: Anesthesiology

## 2017-12-21 ENCOUNTER — Encounter
Admission: RE | Disposition: A | Discharge status: Home / Self Care | Payer: Self-pay | Source: Ambulatory Visit | Attending: Gastroenterology

## 2017-12-21 DIAGNOSIS — K766 Portal hypertension: Secondary | ICD-10-CM

## 2017-12-21 DIAGNOSIS — F329 Major depressive disorder, single episode, unspecified: Secondary | ICD-10-CM | POA: Diagnosis not present

## 2017-12-21 DIAGNOSIS — K219 Gastro-esophageal reflux disease without esophagitis: Secondary | ICD-10-CM | POA: Diagnosis not present

## 2017-12-21 DIAGNOSIS — K7031 Alcoholic cirrhosis of liver with ascites: Secondary | ICD-10-CM | POA: Diagnosis not present

## 2017-12-21 DIAGNOSIS — G4730 Sleep apnea, unspecified: Secondary | ICD-10-CM | POA: Insufficient documentation

## 2017-12-21 DIAGNOSIS — K3189 Other diseases of stomach and duodenum: Secondary | ICD-10-CM | POA: Insufficient documentation

## 2017-12-21 DIAGNOSIS — K7460 Unspecified cirrhosis of liver: Secondary | ICD-10-CM | POA: Diagnosis present

## 2017-12-21 DIAGNOSIS — Z79899 Other long term (current) drug therapy: Secondary | ICD-10-CM | POA: Diagnosis not present

## 2017-12-21 DIAGNOSIS — F418 Other specified anxiety disorders: Secondary | ICD-10-CM | POA: Diagnosis not present

## 2017-12-21 DIAGNOSIS — F419 Anxiety disorder, unspecified: Secondary | ICD-10-CM | POA: Diagnosis not present

## 2017-12-21 DIAGNOSIS — R011 Cardiac murmur, unspecified: Secondary | ICD-10-CM | POA: Insufficient documentation

## 2017-12-21 DIAGNOSIS — F17210 Nicotine dependence, cigarettes, uncomplicated: Secondary | ICD-10-CM | POA: Diagnosis not present

## 2017-12-21 DIAGNOSIS — D649 Anemia, unspecified: Secondary | ICD-10-CM | POA: Diagnosis not present

## 2017-12-21 HISTORY — PX: ESOPHAGOGASTRODUODENOSCOPY (EGD) WITH PROPOFOL: SHX5813

## 2017-12-21 SURGERY — ESOPHAGOGASTRODUODENOSCOPY (EGD) WITH PROPOFOL
Anesthesia: General

## 2017-12-21 MED ORDER — FENTANYL CITRATE (PF) 100 MCG/2ML IJ SOLN
INTRAMUSCULAR | Status: AC
  Filled 2017-12-21: qty 2

## 2017-12-21 MED ORDER — PROPOFOL 10 MG/ML IV BOLUS
INTRAVENOUS | Status: DC | PRN
  Administered 2017-12-21: 250 mg via INTRAVENOUS

## 2017-12-21 MED ORDER — EPHEDRINE SULFATE 50 MG/ML IJ SOLN
INTRAMUSCULAR | Status: DC | PRN
  Administered 2017-12-21: 10 mg via INTRAVENOUS

## 2017-12-21 MED ORDER — FENTANYL CITRATE (PF) 100 MCG/2ML IJ SOLN
INTRAMUSCULAR | Status: DC | PRN
  Administered 2017-12-21 (×2): 50 ug via INTRAVENOUS

## 2017-12-21 MED ORDER — PHENYLEPHRINE HCL 10 MG/ML IJ SOLN
INTRAMUSCULAR | Status: DC | PRN
  Administered 2017-12-21: 100 ug via INTRAVENOUS

## 2017-12-21 MED ORDER — LIDOCAINE HCL (PF) 2 % IJ SOLN
INTRAMUSCULAR | Status: AC
Start: 2017-12-21 — End: ?
  Filled 2017-12-21: qty 10

## 2017-12-21 MED ORDER — SODIUM CHLORIDE 0.9 % IV SOLN
INTRAVENOUS | Status: DC
  Administered 2017-12-21 (×3): via INTRAVENOUS

## 2017-12-21 NOTE — Op Note (Signed)
[facility] Gastroenterology Patient Name: Derrick Blanchard Procedure Date: 12/21/2017 11:35 AM MRN: [id number] Account #: [id number] Date of Birth: 1981-07-26 Admit Type: Outpatient Age: 37 Room: [facility] ENDO ROOM 3 Gender: Male Note Status: Finalized Procedure:            Upper GI endoscopy Indications:          Cirrhosis rule out esophageal varices Providers:            [name] Brinklee Cisse MD, MD Referring MD:         Gabriel Cirriheryl Wicker (Referring MD) Medicines:            Monitored Anesthesia Care Complications:        No immediate complications. Procedure:            Pre-Anesthesia Assessment:                       - Prior to the procedure, a History and Physical was                        performed, and patient medications, allergies and                        sensitivities were reviewed. The patient's tolerance of                        previous anesthesia was reviewed.                       - The risks and benefits of the procedure and the                        sedation options and risks were discussed with the                        patient. All questions were answered and informed                        consent was obtained.                       - ASA Grade Assessment: III - A patient with severe                        systemic disease.                       After obtaining informed consent, the endoscope was                        passed under direct vision. Throughout the procedure,                        the patient's blood pressure, pulse, and oxygen                        saturations were monitored continuously. The Endoscope                        was introduced through the mouth, and advanced to the  third part of duodenum. The upper GI endoscopy was                        accomplished with ease. The patient tolerated the                        procedure well. Findings:      The examined duodenum was normal.      The esophagus was  normal.      Moderate portal hypertensive gastropathy was found in the entire       examined stomach.      The cardia and gastric fundus were normal on retroflexion. Impression:           - Normal examined duodenum.                       - Normal esophagus.                       - Portal hypertensive gastropathy.                       - No specimens collected. Recommendation:       - Discharge patient to home (with escort).                       - Resume previous diet.                       - Continue present medications.                       - Repeat upper endoscopy in 3 years for surveillance. Procedure Code(s):    --- Professional ---                       [phone], Esophagogastroduodenoscopy, flexible, transoral;                        diagnostic, including collection of specimen(s) by                        brushing or washing, when performed (separate procedure) Diagnosis Code(s):    --- Professional ---                       K76.6, Portal hypertension                       K31.89, Other diseases of stomach and duodenum                       K74.60, Unspecified cirrhosis of liver CPT copyright 2017 American Medical Association. All rights reserved. The codes documented in this report are preliminary and upon coder review may  be revised to meet current compliance requirements. Wyline Mood, MD Wyline Mood MD, MD 12/21/2017 11:51:04 AM This report has been signed electronically. Number of Addenda: 0 Note Initiated On: 12/21/2017 11:35 AM      [facility]

## 2017-12-21 NOTE — H&P (Signed)
Wyline Mood, MD [address], [place], [place], [address] [address], [place], [place], [address] Phone: [phone]  Fax: [phone]  Primary Care Physician:  Gabriel Cirri, NP   Pre-Procedure History & Physical: HPI:  Derrick Blanchard is a 37 y.o. male is here for an endoscopy    Past Medical History:  Diagnosis Date  . Anemia   . Anxiety   . Cirrhosis (HCC)   . Depression   . Heart murmur   . Hepatitis   . Hypertension   . Substance abuse [facility])     Past Surgical History:  Procedure Laterality Date  . COLONOSCOPY    . ESOPHAGOGASTRODUODENOSCOPY (EGD) WITH PROPOFOL N/A 02/03/2015   Procedure: ESOPHAGOGASTRODUODENOSCOPY (EGD) WITH PROPOFOL;  Surgeon: Elnita Maxwell, MD;  Location: [facility] ENDOSCOPY;  Service: Endoscopy;  Laterality: N/A;  . FISSURECTOMY      Prior to Admission medications   Medication Sig Start Date End Date Taking? Authorizing Provider  citalopram (CELEXA) 20 MG tablet Take 1 tablet (20 mg total) by mouth daily. 11/24/17  Yes Gabriel Cirri, NP  LORazepam (ATIVAN) 0.5 MG tablet Take 1 tablet (0.5 mg total) by mouth 2 (two) times daily as needed for anxiety. 11/24/17  Yes Gabriel Cirri, NP  magnesium oxide (MAG-OX) 400 (241.3 Mg) MG tablet Take 1 tablet (400 mg total) by mouth daily. 10/17/17  Yes Sudini, Wardell Heath, MD  omeprazole (PRILOSEC) 40 MG capsule Take 1 capsule (40 mg total) by mouth daily. 12/13/17  Yes Wyline Mood, MD  potassium chloride (KLOR-CON) 20 MEQ packet Take 20 mEq by mouth daily. 10/16/17  Yes Sudini, Wardell Heath, MD  naltrexone (DEPADE) 50 MG tablet Take 0.5 tablets (25 mg total) by mouth daily. Patient not taking: Reported on 12/21/2017 10/17/17   Milagros Loll, MD  pantoprazole (PROTONIX) 40 MG tablet Take 1 tablet (40 mg total) by mouth daily. Patient not taking: Reported on 11/24/2017 09/26/17   Katha Hamming, MD  potassium chloride SA (K-DUR,KLOR-CON) 20 MEQ tablet Take 1 tablet (20 mEq total) by  mouth daily. 11/25/17   Gabriel Cirri, NP    Allergies as of 12/13/2017  . (No Known Allergies)    Family History  Problem Relation Age of Onset  . Lung cancer Mother   . Cirrhosis Father   . Lung cancer Paternal Grandfather     Social History   Socioeconomic History  . Marital status: Single    Spouse name: Not on file  . Number of children: Not on file  . Years of education: Not on file  . Highest education level: Not on file  Occupational History  . Not on file  Social Needs  . Financial resource strain: Not on file  . Food insecurity:    Worry: Not on file    Inability: Not on file  . Transportation needs:    Medical: Not on file    Non-medical: Not on file  Tobacco Use  . Smoking status: Current Some Day Smoker    Packs/day: 0.25    Types: Cigarettes  . Smokeless tobacco: Never Used  Substance and Sexual Activity  . Alcohol use: Not Currently    Frequency: Never    Comment: since 10/03/17  . Drug use: Yes    Types: Marijuana    Comment: 12/21/17  . Sexual activity: Yes  Lifestyle  . Physical activity:    Days per week: Not on file    Minutes per session: Not on file  . Stress:  Not on file  Relationships  . Social connections:    Talks on phone: Not on file    Gets together: Not on file    Attends religious service: Not on file    Active member of club or organization: Not on file    Attends meetings of clubs or organizations: Not on file    Relationship status: Not on file  . Intimate partner violence:    Fear of current or ex partner: Not on file    Emotionally abused: Not on file    Physically abused: Not on file    Forced sexual activity: Not on file  Other Topics Concern  . Not on file  Social History Narrative  . Not on file    Review of Systems: See HPI, otherwise negative ROS  Physical Exam: BP 109/61   Pulse (!) 55   Temp (!) 97.1 F (36.2 C) (Tympanic)   Resp 16   Ht 5\' 7"  (1.702 m)   Wt 195 lb (88.5 kg)   SpO2 100%   BMI  30.54 kg/m  General:   Alert,  pleasant and cooperative in NAD Head:  Normocephalic and atraumatic. Neck:  Supple; no masses or thyromegaly. Lungs:  Clear throughout to auscultation, normal respiratory effort.    Heart:  +S1, +S2, Regular rate and rhythm, No edema. Abdomen:  Soft, nontender and nondistended. Normal bowel sounds, without guarding, and without rebound.   Neurologic:  Alert and  oriented x4;  grossly normal neurologically.  Impression/Plan: Derrick Blanchard is here for an endoscopy  to be performed for  evaluation of esophageal varices    Risks, benefits, limitations, and alternatives regarding endoscopy have been reviewed with the patient.  Questions have been answered.  All parties agreeable.   [name] Curran Lenderman, MD  12/21/2017, 10:42 AM

## 2017-12-21 NOTE — Anesthesia Post-op Follow-up Note (Signed)
Anesthesia QCDR form completed.        

## 2017-12-21 NOTE — Anesthesia Preprocedure Evaluation (Signed)
Anesthesia Evaluation  Patient identified by MRN, date of birth, ID band Patient awake    Reviewed: Allergy & Precautions, NPO status , Patient's Chart, lab work & pertinent test results, reviewed documented beta blocker date and time   Airway Mallampati: II  TM Distance: >3 FB     Dental  (+) Chipped   Pulmonary sleep apnea , Current Smoker,           Cardiovascular hypertension, Pt. on medications      Neuro/Psych PSYCHIATRIC DISORDERS Anxiety Depression    GI/Hepatic GERD  ,(+) Cirrhosis       , Hepatitis -  Endo/Other    Renal/GU      Musculoskeletal   Abdominal   Peds  Hematology  (+) anemia ,   Anesthesia Other Findings ETOH.  Reproductive/Obstetrics                             Anesthesia Physical Anesthesia Plan  ASA: III  Anesthesia Plan: General   Post-op Pain Management:    Induction: Intravenous  PONV Risk Score and Plan:   Airway Management Planned:   Additional Equipment:   Intra-op Plan:   Post-operative Plan:   Informed Consent: I have reviewed the patients History and Physical, chart, labs and discussed the procedure including the risks, benefits and alternatives for the proposed anesthesia with the patient or authorized representative who has indicated his/her understanding and acceptance.     Plan Discussed with: CRNA  Anesthesia Plan Comments:         Anesthesia Quick Evaluation

## 2017-12-21 NOTE — Transfer of Care (Signed)
Immediate Anesthesia Transfer of Care Note  Patient: Derrick Blanchard  Procedure(s) Performed: ESOPHAGOGASTRODUODENOSCOPY (EGD) WITH PROPOFOL (N/A )  Patient Location: PACU and Endoscopy Unit  Anesthesia Type:General  Level of Consciousness: awake and patient cooperative  Airway & Oxygen Therapy: Patient Spontanous Breathing and Patient connected to nasal cannula oxygen  Post-op Assessment: Report given to RN and Post -op Vital signs reviewed and stable  Post vital signs: Reviewed and stable  Last Vitals:  Vitals Value Taken Time  BP    Temp    Pulse    Resp    SpO2      Last Pain:  Vitals:   12/21/17 1010  TempSrc: Tympanic  PainSc: 0-No pain         Complications: No apparent anesthesia complications

## 2017-12-21 NOTE — Anesthesia Postprocedure Evaluation (Signed)
Anesthesia Post Note  Patient: Derrick Blanchard  Procedure(s) Performed: ESOPHAGOGASTRODUODENOSCOPY (EGD) WITH PROPOFOL (N/A )  Patient location during evaluation: Endoscopy Anesthesia Type: General Level of consciousness: awake and alert Pain management: pain level controlled Vital Signs Assessment: post-procedure vital signs reviewed and stable Respiratory status: spontaneous breathing, nonlabored ventilation, respiratory function stable and patient connected to nasal cannula oxygen Cardiovascular status: blood pressure returned to baseline and stable Postop Assessment: no apparent nausea or vomiting Anesthetic complications: no     Last Vitals:  Vitals:   12/21/17 1201 12/21/17 1210  BP:  104/66  Pulse: 61 71  Resp: 15 (!) 22  Temp: 37 C   SpO2: 100% 99%    Last Pain:  Vitals:   12/21/17 1210  TempSrc:   PainSc: 0-No pain                 Monquie Fulgham S

## 2017-12-22 ENCOUNTER — Encounter: Payer: Self-pay | Admitting: Gastroenterology

## 2017-12-25 ENCOUNTER — Other Ambulatory Visit: Payer: Self-pay | Admitting: Unknown Physician Specialty

## 2017-12-25 NOTE — Telephone Encounter (Signed)
Ativan 0.5 mg tablet refill request  LOV 11/24/17 with Gabriel Cirriheryl Wicker  Last RF:  11/24/17 #30  [id number] [address] [place] - [address]

## 2017-12-26 ENCOUNTER — Other Ambulatory Visit: Payer: Self-pay | Admitting: Unknown Physician Specialty

## 2017-12-28 ENCOUNTER — Other Ambulatory Visit: Payer: Self-pay | Admitting: Unknown Physician Specialty

## 2017-12-29 LAB — HIV ANTIBODY (ROUTINE TESTING W REFLEX): HIV SCREEN 4TH GENERATION: NONREACTIVE

## 2018-01-01 ENCOUNTER — Ambulatory Visit: Payer: Self-pay

## 2018-01-01 NOTE — Telephone Encounter (Signed)
Pt should be seen.  I need at least 30 minutes with him.  But, he should go to the ER for his chest pain.

## 2018-01-01 NOTE — Telephone Encounter (Signed)
Spoke with patient. He states he does not feel that he needs to go to the ER but will if he gets worse. He scheduled appointment 5/15 with Elnita Maxwell and will call if he needs to be seen sooner.

## 2018-01-01 NOTE — Telephone Encounter (Signed)
See below and please advise. Thank you!

## 2018-01-01 NOTE — Telephone Encounter (Signed)
Pt c/o left lower leg numbness that starts at the left knee and goes to the left foot. Pt c/o the foot feeling"tight". Pt denies any edema to the foot. Pt approximates that the numbness began last October. Pt states the numbness is intermittent and also denies and color changes to the left lower leg and foot.  Pt also c/o that both hands intermittently feel "ice cold." Pt stated his cold hands started 2 months ago.  Pt c/o cardiac sx of intermittent left upper chest pain that radiates to his left shoulder and occasional lightheadedness upon standing. He occasionally feels unsteady on his feet.  Pt states he has h/o anxiety and has been out of his lorazepam for several days,though he has a refill ready to pick up at the pharmacy. He states he has slept 7 hours for the past 2 days. Due to his sx, attempted to make a 30 minute  appointment. Called PCP office and spoke to Clydie Braun who advised to send triage note back to office for Gabriel Cirri NP to review.  Reason for Disposition . [1] Numbness or tingling in one or both feet AND [2] is a chronic symptom (recurrent or ongoing AND present > 4 weeks)  Answer Assessment - Initial Assessment Questions 1. SYMPTOM: "What is the main symptom you are concerned about?" (e.g., weakness, numbness)     Left lower leg from knee numbness and both hands "gets ice cold" intermittently occurs 2. ONSET: "When did this start?" (minutes, hours, days; while sleeping)     Left lower leg since October; hands goes numb 2 months 3. LAST NORMAL: "When was the last time you were normal (no symptoms)?"     Few months to last year 4. PATTERN "Does this come and go, or has it been constant since it started?"  "Is it present now?"     Comes and goes- LLE is not present now- pt thinks his hands feel cool 5. CARDIAC SYMPTOMS: "Have you had any of the following symptoms: chest pain, difficulty breathing, palpitations?"     Chest pain left upper chest to the shoulder, palpitations,  difficulty breathing in the past  6. NEUROLOGIC SYMPTOMS: "Have you had any of the following symptoms: headache, dizziness, vision loss, double vision, changes in speech, unsteady on your feet?"     Lightheaded with standing, "a little bit" of unsteadiness on feet 7. OTHER SYMPTOMS: "Do you have any other symptoms?"     no 8. PREGNANCY: "Is there any chance you are pregnant?" "When was your last menstrual period?"     n/a  Protocols used: NEUROLOGIC DEFICIT-A-AH

## 2018-01-01 NOTE — Telephone Encounter (Signed)
Routing to provider. Please advise.

## 2018-01-17 ENCOUNTER — Ambulatory Visit: Payer: Medicaid Other | Admitting: Unknown Physician Specialty

## 2018-01-23 ENCOUNTER — Other Ambulatory Visit: Payer: Self-pay | Admitting: Unknown Physician Specialty

## 2018-01-24 ENCOUNTER — Ambulatory Visit: Payer: Self-pay | Admitting: Unknown Physician Specialty

## 2018-01-24 NOTE — Telephone Encounter (Signed)
lorazepam refill Last OV: 11/24/17 Last Refill:12/25/17 #30 tabs No RF Pharmacy:[facility] [address] PCP: Gabriel Cirri NP

## 2018-01-25 ENCOUNTER — Other Ambulatory Visit: Payer: Self-pay | Admitting: Unknown Physician Specialty

## 2018-01-25 MED ORDER — LORAZEPAM 0.5 MG PO TABS
0.5000 mg | ORAL_TABLET | Freq: Every day | ORAL | 0 refills | Status: DC | PRN
Start: 2018-01-25 — End: 2023-03-14

## 2018-01-25 NOTE — Telephone Encounter (Signed)
Refill request Lorazepam 0.5  LOV 11/24/2017  Gabriel Cirri Last filled 12/25/2017 30 tabs  - 1 tab bid prn  NR - Gabriel Cirri  Pharmacy on file

## 2018-01-25 NOTE — Telephone Encounter (Signed)
Spoke with patient. He has scheduled an appointment for 6/4. He is hoping Derrick Blanchard will refill his anxiety med until he is seen.  I told him I would let Derrick Blanchard know.   Thank you

## 2018-01-25 NOTE — Telephone Encounter (Signed)
OK, will refill.  But no showed his last visit

## 2018-01-25 NOTE — Addendum Note (Signed)
Addended by: Gabriel Cirri on: 01/25/2018 12:52 PM   Modules accepted: Orders

## 2018-01-25 NOTE — Telephone Encounter (Signed)
Called patient left message regarding script being sent to [facility] [address]. Patient was informed he would need to keep his upcoming appointment

## 2018-02-06 ENCOUNTER — Ambulatory Visit: Payer: Medicaid Other | Admitting: Unknown Physician Specialty

## 2018-02-12 ENCOUNTER — Encounter: Payer: Self-pay | Admitting: Gastroenterology

## 2018-02-12 ENCOUNTER — Ambulatory Visit: Payer: Medicaid Other | Admitting: Gastroenterology

## 2018-04-03 ENCOUNTER — Telehealth: Payer: Self-pay | Admitting: Gastroenterology

## 2018-04-03 NOTE — Telephone Encounter (Signed)
Pt left vm he states he missed a call from a message he left yesterday

## 2018-04-04 ENCOUNTER — Telehealth: Payer: Self-pay

## 2018-04-04 NOTE — Telephone Encounter (Signed)
Returned patients call to let him know that we did not try to contact him, but if he needs anything to call us back.  Thanks [employer]

## 2018-05-04 ENCOUNTER — Ambulatory Visit: Payer: Medicaid Other | Admitting: Family Medicine

## 2018-05-30 ENCOUNTER — Encounter: Payer: Self-pay | Admitting: Unknown Physician Specialty

## 2018-07-26 ENCOUNTER — Emergency Department
Admission: EM | Admit: 2018-07-26 | Discharge: 2018-07-26 | Disposition: A | Discharge status: Home / Self Care | Payer: Medicaid Other | Attending: Emergency Medicine | Admitting: Emergency Medicine

## 2018-07-26 ENCOUNTER — Encounter: Payer: Self-pay | Admitting: Emergency Medicine

## 2018-07-26 ENCOUNTER — Emergency Department: Payer: Medicaid Other

## 2018-07-26 DIAGNOSIS — Z79899 Other long term (current) drug therapy: Secondary | ICD-10-CM | POA: Diagnosis not present

## 2018-07-26 DIAGNOSIS — X58XXXA Exposure to other specified factors, initial encounter: Secondary | ICD-10-CM | POA: Diagnosis not present

## 2018-07-26 DIAGNOSIS — Y999 Unspecified external cause status: Secondary | ICD-10-CM | POA: Diagnosis not present

## 2018-07-26 DIAGNOSIS — Y939 Activity, unspecified: Secondary | ICD-10-CM | POA: Insufficient documentation

## 2018-07-26 DIAGNOSIS — S0181XA Laceration without foreign body of other part of head, initial encounter: Secondary | ICD-10-CM

## 2018-07-26 DIAGNOSIS — Y929 Unspecified place or not applicable: Secondary | ICD-10-CM | POA: Diagnosis not present

## 2018-07-26 DIAGNOSIS — I10 Essential (primary) hypertension: Secondary | ICD-10-CM | POA: Insufficient documentation

## 2018-07-26 DIAGNOSIS — F17210 Nicotine dependence, cigarettes, uncomplicated: Secondary | ICD-10-CM | POA: Insufficient documentation

## 2018-07-26 DIAGNOSIS — S0993XA Unspecified injury of face, initial encounter: Secondary | ICD-10-CM | POA: Diagnosis present

## 2018-07-26 MED ORDER — LIDOCAINE HCL (PF) 1 % IJ SOLN
INTRAMUSCULAR | Status: AC
  Administered 2018-07-26: 5 mL
  Filled 2018-07-26: qty 5

## 2018-07-26 MED ORDER — LIDOCAINE HCL (PF) 1 % IJ SOLN
5.0000 mL | Freq: Once | INTRAMUSCULAR | Status: AC
  Administered 2018-07-26: 5 mL
  Filled 2018-07-26: qty 5

## 2018-07-26 MED ORDER — LIDOCAINE-EPINEPHRINE 1 %-1:100000 IJ SOLN
INTRAMUSCULAR | Status: AC
  Filled 2018-07-26: qty 1

## 2018-07-26 MED ORDER — LIDOCAINE VISCOUS HCL 2 % MT SOLN: 5.0000 mL | Freq: Four times a day (QID) | OROMUCOSAL | 0 refills | Status: DC | PRN

## 2018-07-26 MED ORDER — TRAMADOL HCL 50 MG PO TABS: 50.0000 mg | ORAL_TABLET | Freq: Two times a day (BID) | ORAL | 0 refills | Status: DC | PRN

## 2018-07-26 MED ORDER — LIDOCAINE VISCOUS HCL 2 % MT SOLN
15.0000 mL | Freq: Once | OROMUCOSAL | Status: AC
Start: 2018-07-26 — End: 2018-07-26
  Administered 2018-07-26: 15 mL via OROMUCOSAL
  Filled 2018-07-26: qty 15
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
## 2018-07-26 NOTE — ED Notes (Signed)
Dr Andee Polesvaught is here repairing laceration.
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
## 2018-07-26 NOTE — Discharge Instructions (Addendum)
Advised soft diet until sutures are removed.
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
## 2018-08-31 ENCOUNTER — Other Ambulatory Visit: Payer: Self-pay

## 2018-08-31 ENCOUNTER — Emergency Department
Admission: EM | Admit: 2018-08-31 | Discharge: 2018-08-31 | Disposition: A | Discharge status: Home / Self Care | Payer: Medicaid Other | Attending: Emergency Medicine | Admitting: Emergency Medicine

## 2018-08-31 ENCOUNTER — Emergency Department: Payer: Medicaid Other

## 2018-08-31 ENCOUNTER — Encounter: Payer: Self-pay | Admitting: Emergency Medicine

## 2018-08-31 DIAGNOSIS — S0990XA Unspecified injury of head, initial encounter: Secondary | ICD-10-CM | POA: Diagnosis present

## 2018-08-31 DIAGNOSIS — S3991XA Unspecified injury of abdomen, initial encounter: Secondary | ICD-10-CM | POA: Insufficient documentation

## 2018-08-31 DIAGNOSIS — I10 Essential (primary) hypertension: Secondary | ICD-10-CM | POA: Diagnosis not present

## 2018-08-31 DIAGNOSIS — F17210 Nicotine dependence, cigarettes, uncomplicated: Secondary | ICD-10-CM | POA: Diagnosis not present

## 2018-08-31 DIAGNOSIS — S0003XA Contusion of scalp, initial encounter: Secondary | ICD-10-CM | POA: Insufficient documentation

## 2018-08-31 DIAGNOSIS — Z79899 Other long term (current) drug therapy: Secondary | ICD-10-CM | POA: Insufficient documentation

## 2018-08-31 DIAGNOSIS — S1083XA Contusion of other specified part of neck, initial encounter: Secondary | ICD-10-CM | POA: Insufficient documentation

## 2018-08-31 DIAGNOSIS — Y929 Unspecified place or not applicable: Secondary | ICD-10-CM | POA: Diagnosis not present

## 2018-08-31 DIAGNOSIS — Y939 Activity, unspecified: Secondary | ICD-10-CM | POA: Diagnosis not present

## 2018-08-31 DIAGNOSIS — Y999 Unspecified external cause status: Secondary | ICD-10-CM | POA: Diagnosis not present

## 2018-08-31 DIAGNOSIS — T07XXXA Unspecified multiple injuries, initial encounter: Secondary | ICD-10-CM

## 2018-08-31 LAB — COMPREHENSIVE METABOLIC PANEL
ALK PHOS: 147 U/L — AB (ref 38–126)
ALT: 80 U/L — ABNORMAL HIGH (ref 0–44)
ANION GAP: 8 (ref 5–15)
AST: 246 U/L — ABNORMAL HIGH (ref 15–41)
Albumin: 4.1 g/dL (ref 3.5–5.0)
BUN: 5 mg/dL — ABNORMAL LOW (ref 6–20)
CALCIUM: 8.6 mg/dL — AB (ref 8.9–10.3)
CHLORIDE: 108 mmol/L (ref 98–111)
CO2: 24 mmol/L (ref 22–32)
Creatinine, Ser: 0.53 mg/dL — ABNORMAL LOW (ref 0.61–1.24)
GFR calc non Af Amer: >60 mL/min (ref >60)
Glucose, Bld: 108 mg/dL — ABNORMAL HIGH (ref 70–99)
Potassium: 3.6 mmol/L (ref 3.5–5.1)
SODIUM: 140 mmol/L (ref 135–145)
Total Bilirubin: 2.6 mg/dL — ABNORMAL HIGH (ref 0.3–1.2)
Total Protein: 8.6 g/dL — ABNORMAL HIGH (ref 6.5–8.1)

## 2018-08-31 LAB — CBC
HCT: 36.9 % — ABNORMAL LOW (ref 39.0–52.0)
Hemoglobin: 12 g/dL — ABNORMAL LOW (ref 13.0–17.0)
MCH: 28.5 pg (ref 26.0–34.0)
MCHC: 32.5 g/dL (ref 30.0–36.0)
MCV: 87.6 fL (ref 80.0–100.0)
PLATELETS: 94 10*3/uL — AB (ref 150–400)
RBC: 4.21 MIL/uL — ABNORMAL LOW (ref 4.22–5.81)
RDW: 15.9 % — ABNORMAL HIGH (ref 11.5–15.5)
WBC: 6.5 10*3/uL (ref 4.0–10.5)
nRBC: 0 % (ref 0.0–0.2)

## 2018-08-31 MED ORDER — MORPHINE SULFATE (PF) 2 MG/ML IV SOLN
2.0000 mg | Freq: Once | INTRAVENOUS | Status: AC
  Administered 2018-08-31: 2 mg via INTRAVENOUS

## 2018-08-31 MED ORDER — MORPHINE SULFATE (PF) 4 MG/ML IV SOLN
4.0000 mg | Freq: Once | INTRAVENOUS | Status: AC
  Administered 2018-08-31: 4 mg via INTRAVENOUS
  Filled 2018-08-31: qty 1

## 2018-08-31 MED ORDER — IOPAMIDOL (ISOVUE-300) INJECTION 61%
100.0000 mL | Freq: Once | INTRAVENOUS | Status: AC | PRN
  Administered 2018-08-31: 100 mL via INTRAVENOUS

## 2018-08-31 MED ORDER — MORPHINE SULFATE (PF) 2 MG/ML IV SOLN
INTRAVENOUS | Status: AC
  Filled 2018-08-31: qty 1

## 2018-08-31 MED ORDER — TRAMADOL HCL 50 MG PO TABS: 50.0000 mg | ORAL_TABLET | Freq: Four times a day (QID) | ORAL | 0 refills | Status: AC | PRN

## 2018-08-31 MED ORDER — ONDANSETRON HCL 4 MG/2ML IJ SOLN
4.0000 mg | Freq: Once | INTRAMUSCULAR | Status: AC
  Administered 2018-08-31: 4 mg via INTRAVENOUS
  Filled 2018-08-31: qty 2
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
## 2018-08-31 NOTE — ED Notes (Signed)
Pt leaving for CT.  
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
## 2018-08-31 NOTE — ED Notes (Signed)
Esign not working at this time. Pt verbalized understanding of d/c instructions.

## 2018-08-31 NOTE — ED Notes (Signed)
Waiting for friend to come back into room before sending pt to lobby.

## 2018-08-31 NOTE — ED Notes (Signed)
Attempted for IV stick x4.

## 2018-08-31 NOTE — ED Notes (Signed)
Pt resting in bed, no complaints at this time, friend at bedside.
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
## 2018-11-05 ENCOUNTER — Emergency Department: Payer: Medicaid Other

## 2018-11-05 ENCOUNTER — Emergency Department
Admission: EM | Admit: 2018-11-05 | Discharge: 2018-11-05 | Disposition: A | Discharge status: Home / Self Care | Payer: Medicaid Other | Attending: Emergency Medicine | Admitting: Emergency Medicine

## 2018-11-05 ENCOUNTER — Encounter: Payer: Self-pay | Admitting: Emergency Medicine

## 2018-11-05 DIAGNOSIS — K2920 Alcoholic gastritis without bleeding: Secondary | ICD-10-CM

## 2018-11-05 DIAGNOSIS — Z79899 Other long term (current) drug therapy: Secondary | ICD-10-CM | POA: Insufficient documentation

## 2018-11-05 DIAGNOSIS — F17210 Nicotine dependence, cigarettes, uncomplicated: Secondary | ICD-10-CM | POA: Diagnosis not present

## 2018-11-05 DIAGNOSIS — I10 Essential (primary) hypertension: Secondary | ICD-10-CM | POA: Insufficient documentation

## 2018-11-05 DIAGNOSIS — F10188 Alcohol abuse with other alcohol-induced disorder: Secondary | ICD-10-CM | POA: Diagnosis not present

## 2018-11-05 DIAGNOSIS — K2900 Acute gastritis without bleeding: Secondary | ICD-10-CM | POA: Diagnosis not present

## 2018-11-05 DIAGNOSIS — R1011 Right upper quadrant pain: Secondary | ICD-10-CM

## 2018-11-05 DIAGNOSIS — R109 Unspecified abdominal pain: Secondary | ICD-10-CM | POA: Diagnosis present

## 2018-11-05 LAB — COMPREHENSIVE METABOLIC PANEL
ALT: 34 U/L (ref 0–44)
AST: 81 U/L — AB (ref 15–41)
Albumin: 3 g/dL — ABNORMAL LOW (ref 3.5–5.0)
Alkaline Phosphatase: 202 U/L — ABNORMAL HIGH (ref 38–126)
Anion gap: 9 (ref 5–15)
BUN: 6 mg/dL (ref 6–20)
CO2: 21 mmol/L — ABNORMAL LOW (ref 22–32)
Calcium: 7.8 mg/dL — ABNORMAL LOW (ref 8.9–10.3)
Chloride: 108 mmol/L (ref 98–111)
Creatinine, Ser: 0.56 mg/dL — ABNORMAL LOW (ref 0.61–1.24)
GFR calc Af Amer: >60 mL/min (ref >60)
GFR calc non Af Amer: >60 mL/min (ref >60)
GLUCOSE: 139 mg/dL — AB (ref 70–99)
Potassium: 3.1 mmol/L — ABNORMAL LOW (ref 3.5–5.1)
Sodium: 138 mmol/L (ref 135–145)
Total Bilirubin: 1.5 mg/dL — ABNORMAL HIGH (ref 0.3–1.2)
Total Protein: 7.4 g/dL (ref 6.5–8.1)

## 2018-11-05 LAB — URINALYSIS, COMPLETE (UACMP) WITH MICROSCOPIC
BILIRUBIN URINE: NEGATIVE
Bacteria, UA: NONE SEEN
Glucose, UA: NEGATIVE mg/dL
Hgb urine dipstick: NEGATIVE
Ketones, ur: NEGATIVE mg/dL
Leukocytes,Ua: NEGATIVE
Nitrite: NEGATIVE
Protein, ur: NEGATIVE mg/dL
Specific Gravity, Urine: 1.003 — ABNORMAL LOW (ref 1.005–1.030)
Squamous Epithelial / HPF: NONE SEEN (ref 0–5)
WBC, UA: NONE SEEN WBC/hpf (ref 0–5)
pH: 7 (ref 5.0–8.0)

## 2018-11-05 LAB — CBC
HCT: 36.8 % — ABNORMAL LOW (ref 39.0–52.0)
Hemoglobin: 12.1 g/dL — ABNORMAL LOW (ref 13.0–17.0)
MCH: 30.3 pg (ref 26.0–34.0)
MCHC: 32.9 g/dL (ref 30.0–36.0)
MCV: 92 fL (ref 80.0–100.0)
Platelets: 62 10*3/uL — ABNORMAL LOW (ref 150–400)
RBC: 4 MIL/uL — ABNORMAL LOW (ref 4.22–5.81)
RDW: 17.6 % — ABNORMAL HIGH (ref 11.5–15.5)
WBC: 5.8 10*3/uL (ref 4.0–10.5)
nRBC: 0 % (ref 0.0–0.2)

## 2018-11-05 LAB — LIPASE, BLOOD: Lipase: 38 U/L (ref 11–51)

## 2018-11-05 MED ORDER — FAMOTIDINE 20 MG PO TABS: 40.0000 mg | ORAL_TABLET | Freq: Once | ORAL | Status: DC

## 2018-11-05 MED ORDER — SODIUM CHLORIDE 0.9% FLUSH: 3.0000 mL | Freq: Once | INTRAVENOUS | Status: DC

## 2018-11-05 MED ORDER — METOCLOPRAMIDE HCL 10 MG PO TABS: 10.0000 mg | ORAL_TABLET | Freq: Once | ORAL | Status: DC

## 2018-11-05 MED ORDER — ALUM & MAG HYDROXIDE-SIMETH 200-200-20 MG/5ML PO SUSP: 30.0000 mL | Freq: Once | ORAL | Status: DC
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
## 2018-11-05 NOTE — ED Provider Notes (Signed)
[facility] Emergency Department Provider Note  ____________________________________________  Time seen: Approximately 1:46 PM  I have reviewed the triage vital signs and the nursing notes.   HISTORY  Chief Complaint Abdominal Pain    HPI Derrick Blanchard is a 38 y.o. male with a history of cirrhosis, hypertension, hepatitis, alcohol abuse who reports that for the past month he has been drinking again, including a 40 ounce beer today.  Complains of right upper quadrant abdominal pain and decreased appetite.  Not worse with eating, no vomiting or diarrhea.  No black or bloody stool.  Also complains of intermittent sharp chest pain, worse lying down flat, better sitting upright.  Not exertional, not pleuritic, no shortness of breath or diaphoresis palpitations dizziness or syncope.  Also complains of pain and swelling to the left leg that started last 2 days.     Past Medical History:  Diagnosis Date  . Anemia   . Anxiety   . Cirrhosis (HCC)   . Depression   . Heart murmur   . Hepatitis   . Hypertension   . Substance abuse [facility])      Patient Active Problem List   Diagnosis Date Noted  . GERD (gastroesophageal reflux disease) 11/24/2017  . Tachycardia 11/24/2017  . Hypokalemia 10/25/2017  . Thrombocytopenia (HCC) 10/25/2017  . Advance care planning 10/25/2017  . Depression, major, single episode, moderate (HCC) 10/25/2017  . Elevated liver enzymes 09/28/2017  . Hyperbilirubinemia   . Alcohol dependence (HCC) 02/03/2015  . Alcoholic hepatitis 01/30/2015  . Melena 01/30/2015  . Coagulopathy (HCC) 01/30/2015  . Hyponatremia 01/30/2015  . Anemia 01/30/2015     Past Surgical History:  Procedure Laterality Date  . COLONOSCOPY    . ESOPHAGOGASTRODUODENOSCOPY (EGD) WITH PROPOFOL N/A 02/03/2015   Procedure: ESOPHAGOGASTRODUODENOSCOPY (EGD) WITH PROPOFOL;  Surgeon: Elnita Maxwell, MD;  Location: [facility] ENDOSCOPY;  Service: Endoscopy;   Laterality: N/A;  . ESOPHAGOGASTRODUODENOSCOPY (EGD) WITH PROPOFOL N/A 12/21/2017   Procedure: ESOPHAGOGASTRODUODENOSCOPY (EGD) WITH PROPOFOL;  Surgeon: Wyline Mood, MD;  Location: [facility] ENDOSCOPY;  Service: Gastroenterology;  Laterality: N/A;  . FISSURECTOMY       Prior to Admission medications   Medication Sig Start Date End Date Taking? Authorizing Provider  citalopram (CELEXA) 20 MG tablet Take 1 tablet (20 mg total) by mouth daily. 11/24/17   Gabriel Cirri, NP  lidocaine (XYLOCAINE) 2 % solution Use as directed 5 mLs in the mouth or throat every 6 (six) hours as needed for mouth pain. 07/26/18   Joni Reining, PA-C  LORazepam (ATIVAN) 0.5 MG tablet Take 1 tablet (0.5 mg total) by mouth daily as needed. for anxiety 01/25/18   Gabriel Cirri, NP  magnesium oxide (MAG-OX) 400 (241.3 Mg) MG tablet Take 1 tablet (400 mg total) by mouth daily. 10/17/17   Milagros Loll, MD  naltrexone (DEPADE) 50 MG tablet Take 0.5 tablets (25 mg total) by mouth daily. Patient not taking: Reported on 12/21/2017 10/17/17   Milagros Loll, MD  omeprazole (PRILOSEC) 40 MG capsule Take 1 capsule (40 mg total) by mouth daily. 12/13/17   Wyline Mood, MD  oxyCODONE (OXY IR/ROXICODONE) 5 MG immediate release tablet  01/10/18   [provider]  pantoprazole (PROTONIX) 40 MG tablet Take 1 tablet (40 mg total) by mouth daily. Patient not taking: Reported on 11/24/2017 09/26/17   Katha Hamming, MD  potassium chloride (KLOR-CON) 20 MEQ packet Take 20 mEq by mouth daily. 10/16/17   Milagros Loll, MD  potassium chloride SA (K-DUR,KLOR-CON) 20 MEQ  tablet Take 1 tablet (20 mEq total) by mouth daily. 11/25/17   Gabriel Cirri, NP  traMADol (ULTRAM) 50 MG tablet Take 1 tablet (50 mg total) by mouth every 12 (twelve) hours as needed. 07/26/18   Joni Reining, PA-C  traMADol (ULTRAM) 50 MG tablet Take 1 tablet (50 mg total) by mouth every 6 (six) hours as needed. 08/31/18 08/31/19  Darci Current, MD      Allergies Patient has no known allergies.   Family History  Problem Relation Age of Onset  . Lung cancer Mother   . Cirrhosis Father   . Lung cancer Paternal Grandfather     Social History Social History   Tobacco Use  . Smoking status: Current Some Day Smoker    Packs/day: 0.25    Types: Cigarettes  . Smokeless tobacco: Never Used  Substance Use Topics  . Alcohol use: Not Currently    Frequency: Never    Comment: since 10/03/17  . Drug use: Yes    Types: Marijuana    Comment: 12/21/17    Review of Systems  Constitutional:   No fever or chills.  ENT:   No sore throat. No rhinorrhea. Cardiovascular:   Positive as above chest pain without syncope. Respiratory:   No dyspnea or cough. Gastrointestinal: Positive as above abdominal pain without vomiting and diarrhea.  Musculoskeletal: Positive left leg pain and swelling All other systems reviewed and are negative except as documented above in ROS and HPI.  ____________________________________________   PHYSICAL EXAM:  VITAL SIGNS: ED Triage Vitals [11/05/18 1039]  Enc Vitals Group     BP 113/70     Pulse Rate (!) 110     Resp 18     Temp 98.5 F (36.9 C)     Temp Source Oral     SpO2 97 %     Weight 210 lb (95.3 kg)     Height 5\' 8"  (1.727 m)     Head Circumference      Peak Flow      Pain Score 9     Pain Loc      Pain Edu?      Excl. in GC?     Vital signs reviewed, nursing assessments reviewed.   Constitutional:   Alert and oriented. Non-toxic appearance. Eyes:   Conjunctivae are normal. EOMI. PERRL. ENT      Head:   Normocephalic and atraumatic.      Nose:   No congestion/rhinnorhea.       Mouth/Throat:   MMM, no pharyngeal erythema. No peritonsillar mass.       Neck:   No meningismus. Full ROM. Hematological/Lymphatic/Immunilogical:   No cervical lymphadenopathy. Cardiovascular:   RRR, rate of 90. Symmetric bilateral radial and DP pulses.  No murmurs. Cap refill less than 2  seconds. Respiratory:   Normal respiratory effort without tachypnea/retractions. Breath sounds are clear and equal bilaterally. No wheezes/rales/rhonchi. Gastrointestinal:   Soft with right mid abdominal tenderness, inconsistent exam. Non distended. There is no CVA tenderness.  No rebound, rigidity, or guarding. Musculoskeletal:   Normal range of motion in all extremities.  Increased calf circumference on the left compared to right.  Dark erythema of left lower leg from the proximal ankle to mid calf.  Tenderness in this area without warmth or induration.  Foot appears normal neurologic:   Normal speech and language.  Motor grossly intact. No acute focal neurologic deficits are appreciated.  Skin:    Skin is warm, dry and intact.  Erythematous rash on the left lower leg as above.  Small area of bruising on the left pectoralis from patient massaging the area.  No petechiae, purpura, or bullae.  ____________________________________________    LABS (pertinent positives/negatives) (all labs ordered are listed, but only abnormal results are displayed) Labs Reviewed  COMPREHENSIVE METABOLIC PANEL - Abnormal; Notable for the following components:      Result Value   Potassium 3.1 (*)    CO2 21 (*)    Glucose, Bld 139 (*)    Creatinine, Ser 0.56 (*)    Calcium 7.8 (*)    Albumin 3.0 (*)    AST 81 (*)    Alkaline Phosphatase 202 (*)    Total Bilirubin 1.5 (*)    All other components within normal limits  CBC - Abnormal; Notable for the following components:   RBC 4.00 (*)    Hemoglobin 12.1 (*)    HCT 36.8 (*)    RDW 17.6 (*)    Platelets 62 (*)    All other components within normal limits  URINALYSIS, COMPLETE (UACMP) WITH MICROSCOPIC - Abnormal; Notable for the following components:   Color, Urine YELLOW (*)    APPearance CLEAR (*)    Specific Gravity, Urine 1.003 (*)    All other components within normal limits  LIPASE, BLOOD    ____________________________________________   EKG Interpreted by me Sinus tachycardia rate 101, normal axis and intervals.  Left bundle branch block with repolarization abnormality in the lateral leads.  No acute ischemic changes.  Unchanged from prior EKG September 28, 2017 during which he was also tachycardic. ____________________________________________    RADIOLOGY  No results found.  ____________________________________________   PROCEDURES Procedures  ____________________________________________  DIFFERENTIAL DIAGNOSIS   GERD, alcoholic gastritis, pancreatitis, cholecystitis, renal colic, appendicitis, left leg cellulitis or DVT.  Doubt ACS PE dissection AAA pneumothorax pericarditis pneumonia or abdominal perforation obstruction or abscess formation.  CLINICAL IMPRESSION / ASSESSMENT AND PLAN / ED COURSE  Medications ordered in the ED: Medications  sodium chloride flush (NS) 0.9 % injection 3 mL (has no administration in time range)  alum & mag hydroxide-simeth (MAALOX/MYLANTA) 200-200-20 MG/5ML suspension 30 mL (has no administration in time range)  famotidine (PEPCID) tablet 40 mg (has no administration in time range)  metoCLOPramide (REGLAN) tablet 10 mg (has no administration in time range)    Pertinent labs & imaging results that were available during my care of the patient were reviewed by me and considered in my medical decision making (see chart for details).    Patient arrives with multiple complaints.  Suspect alcoholic gastritis related to his relapsing alcohol abuse including today.  Plan to give an acids for this while also obtaining CT scan of the abdomen and ultrasound of the left leg to evaluate for abdominal complications from his liver failure or DVT.  He may further require CT scan of the chest to evaluate for PE.  Not septic, initial labs are overall unremarkable.  After my assessment, the nurse went to check on the patient and bring medications  and found that he was not present in his treatment bed.  Does not appear to be anywhere with radiology.  May have eloped.  He is not psychotic or confused, does have medical decision-making capacity.   ----------------------------------------- 2:56 PM on 11/05/2018 -----------------------------------------  Patient still has not returned to his treatment bed, appears to have eloped.     ____________________________________________   FINAL CLINICAL IMPRESSION(S) / ED DIAGNOSES    Final diagnoses:  Right upper quadrant abdominal pain  Acute alcoholic gastritis without hemorrhage     ED Discharge Orders    None      Portions of this note were generated with dragon dictation software. Dictation errors may occur despite best attempts at proofreading.   Sharman Cheek, MD 11/05/18 [phone]

## 2018-11-05 NOTE — ED Notes (Signed)
Patient ambulatory to [facility].  Tom RN aware of bed placement.

## 2018-11-05 NOTE — ED Notes (Signed)
Went to conduct initial assessment on patient in [facility] and found the bed empty. Ultrasound, CT, and x-ray contacted to see if they had patient and all replied that they had not collected him. This nurse checked with Dr Scotty Court to see if he seemed like he wanted to leave and Dr Scotty Court indicated that the patient seemed to be in agreement with the care plan. Charge nurse notified. Will wait for 20 minutes to see if patient returns.

## 2018-11-07 ENCOUNTER — Emergency Department
Admission: EM | Admit: 2018-11-07 | Discharge: 2018-11-07 | Disposition: A | Discharge status: Home / Self Care | Payer: Medicaid Other | Attending: Emergency Medicine | Admitting: Emergency Medicine

## 2018-11-07 ENCOUNTER — Emergency Department: Payer: Medicaid Other

## 2018-11-07 ENCOUNTER — Other Ambulatory Visit: Payer: Self-pay

## 2018-11-07 DIAGNOSIS — F10920 Alcohol use, unspecified with intoxication, uncomplicated: Secondary | ICD-10-CM | POA: Diagnosis not present

## 2018-11-07 DIAGNOSIS — F17210 Nicotine dependence, cigarettes, uncomplicated: Secondary | ICD-10-CM | POA: Diagnosis not present

## 2018-11-07 DIAGNOSIS — L03115 Cellulitis of right lower limb: Secondary | ICD-10-CM

## 2018-11-07 DIAGNOSIS — R2241 Localized swelling, mass and lump, right lower limb: Secondary | ICD-10-CM | POA: Insufficient documentation

## 2018-11-07 DIAGNOSIS — R1010 Upper abdominal pain, unspecified: Secondary | ICD-10-CM | POA: Diagnosis present

## 2018-11-07 DIAGNOSIS — Z79899 Other long term (current) drug therapy: Secondary | ICD-10-CM | POA: Insufficient documentation

## 2018-11-07 LAB — COMPREHENSIVE METABOLIC PANEL WITH GFR
ALT: 39 U/L (ref 0–44)
AST: 119 U/L — ABNORMAL HIGH (ref 15–41)
Albumin: 3.2 g/dL — ABNORMAL LOW (ref 3.5–5.0)
Alkaline Phosphatase: 251 U/L — ABNORMAL HIGH (ref 38–126)
Anion gap: 7 (ref 5–15)
BUN: 5 mg/dL — ABNORMAL LOW (ref 6–20)
CO2: 23 mmol/L (ref 22–32)
Calcium: 8.1 mg/dL — ABNORMAL LOW (ref 8.9–10.3)
Chloride: 112 mmol/L — ABNORMAL HIGH (ref 98–111)
Creatinine, Ser: 0.41 mg/dL — ABNORMAL LOW (ref 0.61–1.24)
GFR calc Af Amer: >60 mL/min
GFR calc non Af Amer: >60 mL/min
Glucose, Bld: 131 mg/dL — ABNORMAL HIGH (ref 70–99)
Potassium: 4 mmol/L (ref 3.5–5.1)
Sodium: 142 mmol/L (ref 135–145)
Total Bilirubin: 1.7 mg/dL — ABNORMAL HIGH (ref 0.3–1.2)
Total Protein: 7.8 g/dL (ref 6.5–8.1)

## 2018-11-07 LAB — CBC
HCT: 38.7 % — ABNORMAL LOW (ref 39.0–52.0)
Hemoglobin: 12.8 g/dL — ABNORMAL LOW (ref 13.0–17.0)
MCH: 30 pg (ref 26.0–34.0)
MCHC: 33.1 g/dL (ref 30.0–36.0)
MCV: 90.8 fL (ref 80.0–100.0)
Platelets: 95 K/uL — ABNORMAL LOW (ref 150–400)
RBC: 4.26 MIL/uL (ref 4.22–5.81)
RDW: 17.9 % — ABNORMAL HIGH (ref 11.5–15.5)
WBC: 5.6 K/uL (ref 4.0–10.5)
nRBC: 0 % (ref 0.0–0.2)

## 2018-11-07 LAB — LIPASE, BLOOD: Lipase: 40 U/L (ref 11–51)

## 2018-11-07 LAB — ETHANOL: Alcohol, Ethyl (B): 415 mg/dL

## 2018-11-07 MED ORDER — DOXYCYCLINE HYCLATE 100 MG PO TABS
100.0000 mg | ORAL_TABLET | Freq: Once | ORAL | Status: AC
  Administered 2018-11-07: 100 mg via ORAL
  Filled 2018-11-07: qty 1

## 2018-11-07 MED ORDER — KETOROLAC TROMETHAMINE 30 MG/ML IJ SOLN
30.0000 mg | Freq: Once | INTRAMUSCULAR | Status: AC
  Administered 2018-11-07: 30 mg via INTRAMUSCULAR
  Filled 2018-11-07: qty 1

## 2018-11-07 MED ORDER — NAPROXEN 500 MG PO TABS: 500.0000 mg | ORAL_TABLET | Freq: Two times a day (BID) | ORAL | 2 refills | Status: DC

## 2018-11-07 MED ORDER — DOXYCYCLINE HYCLATE 100 MG PO TABS: 100.0000 mg | ORAL_TABLET | Freq: Two times a day (BID) | ORAL | 0 refills | Status: DC

## 2018-11-07 NOTE — ED Triage Notes (Signed)
Pt in with co right sided abd pain pt has hx of liver cirrhosis states has started drinking etoh again and smoking marijuana. Pt states also has left sided leg swelling with large amount of redness noted. Pt states he was here 2 days ago did see EDP but did not wait on results.

## 2018-11-07 NOTE — ED Notes (Addendum)
Pt reports swelling and pain  to the left lower leg . Pt has gross swelling and redness circumferential to the left lower leg. Obvious cellulitis. Pt reports pain to his liver as well. Pt reports a relapse in the last 90 days in which pt drinks 15 to 30 beers a day. Pt was here on Monday but left before results as he had an appt with [facility]. Pt reports worse swelling today.

## 2018-11-07 NOTE — ED Provider Notes (Signed)
[facility] Emergency Department Provider Note   ____________________________________________    I have reviewed the triage vital signs and the nursing notes.   HISTORY  Chief Complaint Abdominal Pain     HPI Derrick Blanchard is a 38 y.o. male who presents with complaints of chronic upper abdominal pain which he attributes to cirrhosis.  He does present after significant alcohol consumption.  He also complains of redness in the right lower leg with swelling.  Is not sure if he had any fevers.  No nausea or vomiting.  Not take anything for the symptoms.  Describes aching abdominal pain which is mild, he is most concerned about his right lower leg which is "burning ".  Past Medical History:  Diagnosis Date  . Anemia   . Anxiety   . Cirrhosis (HCC)   . Depression   . Heart murmur   . Hepatitis   . Hypertension   . Substance abuse [facility])     Patient Active Problem List   Diagnosis Date Noted  . GERD (gastroesophageal reflux disease) 11/24/2017  . Tachycardia 11/24/2017  . Hypokalemia 10/25/2017  . Thrombocytopenia (HCC) 10/25/2017  . Advance care planning 10/25/2017  . Depression, major, single episode, moderate (HCC) 10/25/2017  . Elevated liver enzymes 09/28/2017  . Hyperbilirubinemia   . Alcohol dependence (HCC) 02/03/2015  . Alcoholic hepatitis 01/30/2015  . Melena 01/30/2015  . Coagulopathy (HCC) 01/30/2015  . Hyponatremia 01/30/2015  . Anemia 01/30/2015    Past Surgical History:  Procedure Laterality Date  . COLONOSCOPY    . ESOPHAGOGASTRODUODENOSCOPY (EGD) WITH PROPOFOL N/A 02/03/2015   Procedure: ESOPHAGOGASTRODUODENOSCOPY (EGD) WITH PROPOFOL;  Surgeon: Elnita Maxwell, MD;  Location: [facility] ENDOSCOPY;  Service: Endoscopy;  Laterality: N/A;  . ESOPHAGOGASTRODUODENOSCOPY (EGD) WITH PROPOFOL N/A 12/21/2017   Procedure: ESOPHAGOGASTRODUODENOSCOPY (EGD) WITH PROPOFOL;  Surgeon: Wyline Mood, MD;  Location[facility] ENDOSCOPY;  Service:  Gastroenterology;  Laterality: N/A;  . FISSURECTOMY      Prior to Admission medications   Medication Sig Start Date End Date Taking? Authorizing Provider  citalopram (CELEXA) 20 MG tablet Take 1 tablet (20 mg total) by mouth daily. 11/24/17   Gabriel Cirri, NP  doxycycline (VIBRA-TABS) 100 MG tablet Take 1 tablet (100 mg total) by mouth 2 (two) times daily. 11/07/18   Jene Every, MD  lidocaine (XYLOCAINE) 2 % solution Use as directed 5 mLs in the mouth or throat every 6 (six) hours as needed for mouth pain. 07/26/18   Joni Reining, PA-C  LORazepam (ATIVAN) 0.5 MG tablet Take 1 tablet (0.5 mg total) by mouth daily as needed. for anxiety 01/25/18   Gabriel Cirri, NP  magnesium oxide (MAG-OX) 400 (241.3 Mg) MG tablet Take 1 tablet (400 mg total) by mouth daily. 10/17/17   Milagros Loll, MD  naltrexone (DEPADE) 50 MG tablet Take 0.5 tablets (25 mg total) by mouth daily. Patient not taking: Reported on 12/21/2017 10/17/17   Milagros Loll, MD  naproxen (NAPROSYN) 500 MG tablet Take 1 tablet (500 mg total) by mouth 2 (two) times daily with a meal. 11/07/18   Jene Every, MD  omeprazole (PRILOSEC) 40 MG capsule Take 1 capsule (40 mg total) by mouth daily. 12/13/17   Wyline Mood, MD  oxyCODONE (OXY IR/ROXICODONE) 5 MG immediate release tablet  01/10/18   [provider]  pantoprazole (PROTONIX) 40 MG tablet Take 1 tablet (40 mg total) by mouth daily. Patient not taking: Reported on 11/24/2017 09/26/17   Katha Hamming, MD  potassium chloride (  KLOR-CON) 20 MEQ packet Take 20 mEq by mouth daily. 10/16/17   Milagros LollSudini, Srikar, MD  potassium chloride SA (K-DUR,KLOR-CON) 20 MEQ tablet Take 1 tablet (20 mEq total) by mouth daily. 11/25/17   Gabriel CirriWicker, Cheryl, NP  traMADol (ULTRAM) 50 MG tablet Take 1 tablet (50 mg total) by mouth every 12 (twelve) hours as needed. 07/26/18   Joni ReiningSmith, Ronald K, PA-C  traMADol (ULTRAM) 50 MG tablet Take 1 tablet (50 mg total) by mouth every 6 (six) hours as needed. 08/31/18  08/31/19  Darci CurrentBrown, Thunderbolt N, MD     Allergies Patient has no known allergies.  Family History  Problem Relation Age of Onset  . Lung cancer Mother   . Cirrhosis Father   . Lung cancer Paternal Grandfather     Social History Social History   Tobacco Use  . Smoking status: Current Some Day Smoker    Packs/day: 0.25    Types: Cigarettes  . Smokeless tobacco: Never Used  Substance Use Topics  . Alcohol use: Not Currently    Frequency: Never    Comment: since 10/03/17  . Drug use: Yes    Types: Marijuana    Comment: 12/21/17    Review of Systems  Constitutional: No fever/chills Eyes: No visual changes.  ENT: No sore throat. Cardiovascular: Denies chest pain. Respiratory: Denies shortness of breath. Gastrointestinal: As above Genitourinary: Negative for dysuria. Musculoskeletal: Lower extremity swelling, right greater than left Skin: As above Neurological: Negative for headaches   ____________________________________________   PHYSICAL EXAM:  VITAL SIGNS: ED Triage Vitals [11/07/18 0150]  Enc Vitals Group     BP 132/88     Pulse Rate 90     Resp 20     Temp 98 F (36.7 C)     Temp Source Oral     SpO2 99 %     Weight 95.3 kg (210 lb)     Height 1.727 m (5\' 8" )     Head Circumference      Peak Flow      Pain Score 8     Pain Loc      Pain Edu?      Excl. in GC?     Constitutional: Alert and oriented.  Clinically sober Eyes: Conjunctivae are normal.   Nose: No congestion/rhinnorhea. Mouth/Throat: Mucous membranes are moist.   Neck:  Painless ROM Cardiovascular: Normal rate, regular rhythm. Grossly normal heart sounds.  Good peripheral circulation. Respiratory: Normal respiratory effort.  No retractions. Lungs CTAB. Gastrointestinal: Soft and nontender. No distention.  No significant tenderness, reassuring exam  Musculoskeletal: Right lower extremity, moderate edema with significant erythema suspicious for cellulitis Neurologic:  Normal speech and  language. No gross focal neurologic deficits are appreciated.  Skin:  Skin is warm, dry and intact. No rash noted. Psychiatric: Mood and affect are normal. Speech and behavior are normal.  ____________________________________________   LABS (all labs ordered are listed, but only abnormal results are displayed)  Labs Reviewed  CBC - Abnormal; Notable for the following components:      Result Value   Hemoglobin 12.8 (*)    HCT 38.7 (*)    RDW 17.9 (*)    Platelets 95 (*)    All other components within normal limits  COMPREHENSIVE METABOLIC PANEL - Abnormal; Notable for the following components:   Chloride 112 (*)    Glucose, Bld 131 (*)    BUN 5 (*)    Creatinine, Ser 0.41 (*)    Calcium 8.1 (*)  Albumin 3.2 (*)    AST 119 (*)    Alkaline Phosphatase 251 (*)    Total Bilirubin 1.7 (*)    All other components within normal limits  ETHANOL - Abnormal; Notable for the following components:   Alcohol, Ethyl (B) 415 (*)    All other components within normal limits  LIPASE, BLOOD   ____________________________________________  EKG   ____________________________________________  RADIOLOGY  Ultrasound negative for DVT ____________________________________________   PROCEDURES  Procedure(s) performed: No  Procedures   Critical Care performed: No ____________________________________________   INITIAL IMPRESSION / ASSESSMENT AND PLAN / ED COURSE  Pertinent labs & imaging results that were available during my care of the patient were reviewed by me and considered in my medical decision making (see chart for details).  Patient presents with complaints as above, right lower extremity appears consistent with cellulitis, will start him on antibiotic for this, his right upper quadrant abdominal pain is chronic likely made worse by his severe alcohol intake today.  Have asked him to follow-up closely with his gastroenterologist.      ____________________________________________   FINAL CLINICAL IMPRESSION(S) / ED DIAGNOSES  Final diagnoses:  Alcoholic intoxication without complication (HCC)  Cellulitis of right lower extremity        Note:  This document was prepared using Dragon voice recognition software and may include unintentional dictation errors.   Jene Every, MD 11/07/18 [phone]

## 2018-11-07 NOTE — ED Notes (Signed)
On med hold from IM injection

## 2019-03-22 IMAGING — CR DG CHEST 2V
1 series · 2 of 2 positions shown · non-contrast
Comparison: January 30, 2015

CLINICAL DATA: Chest pain and nausea

EXAM:
CHEST  2 VIEW

[Series 1: dg chest 2 view · 0.14mm/px · 2 of 2 slices shown]
[im 1/2]
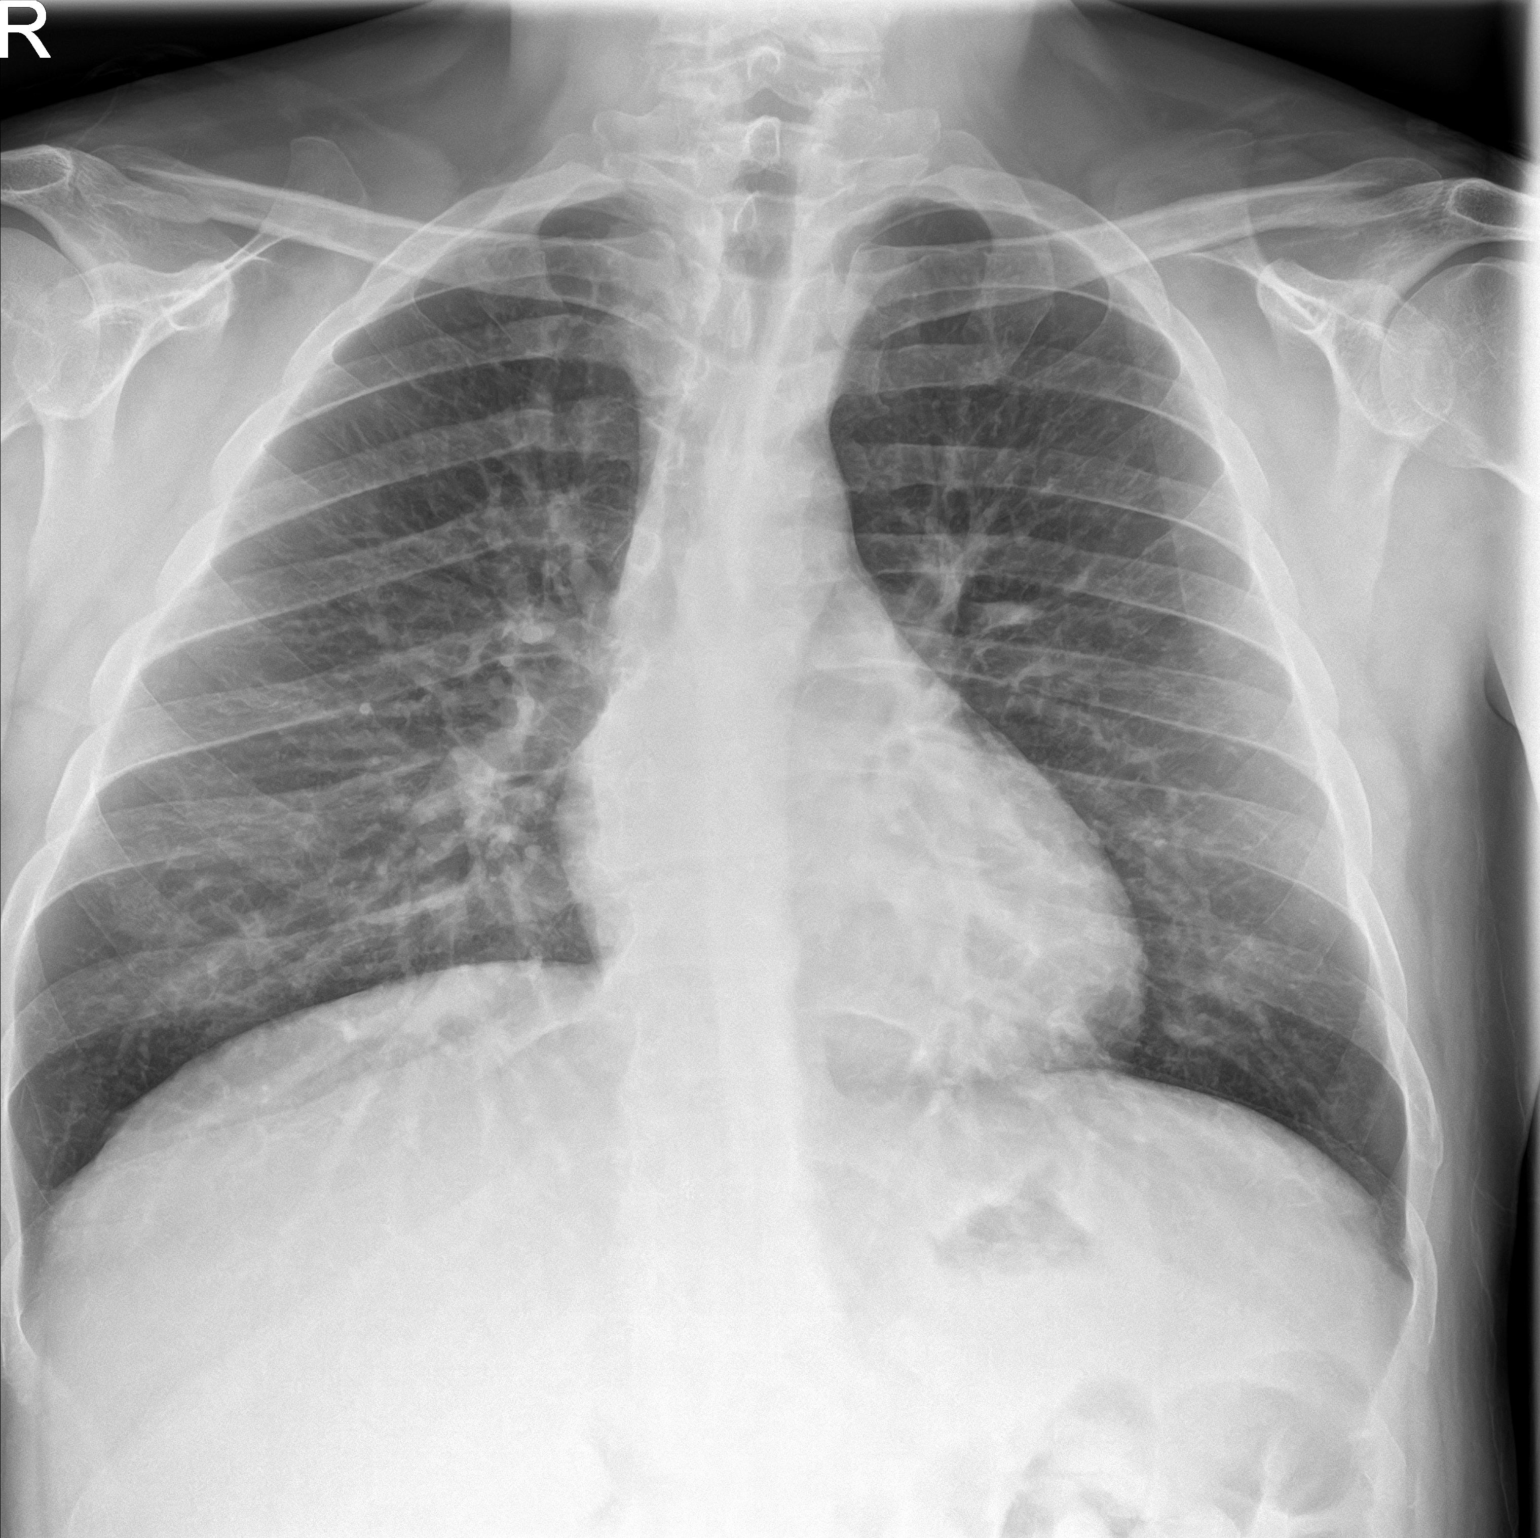
[im 2/2]
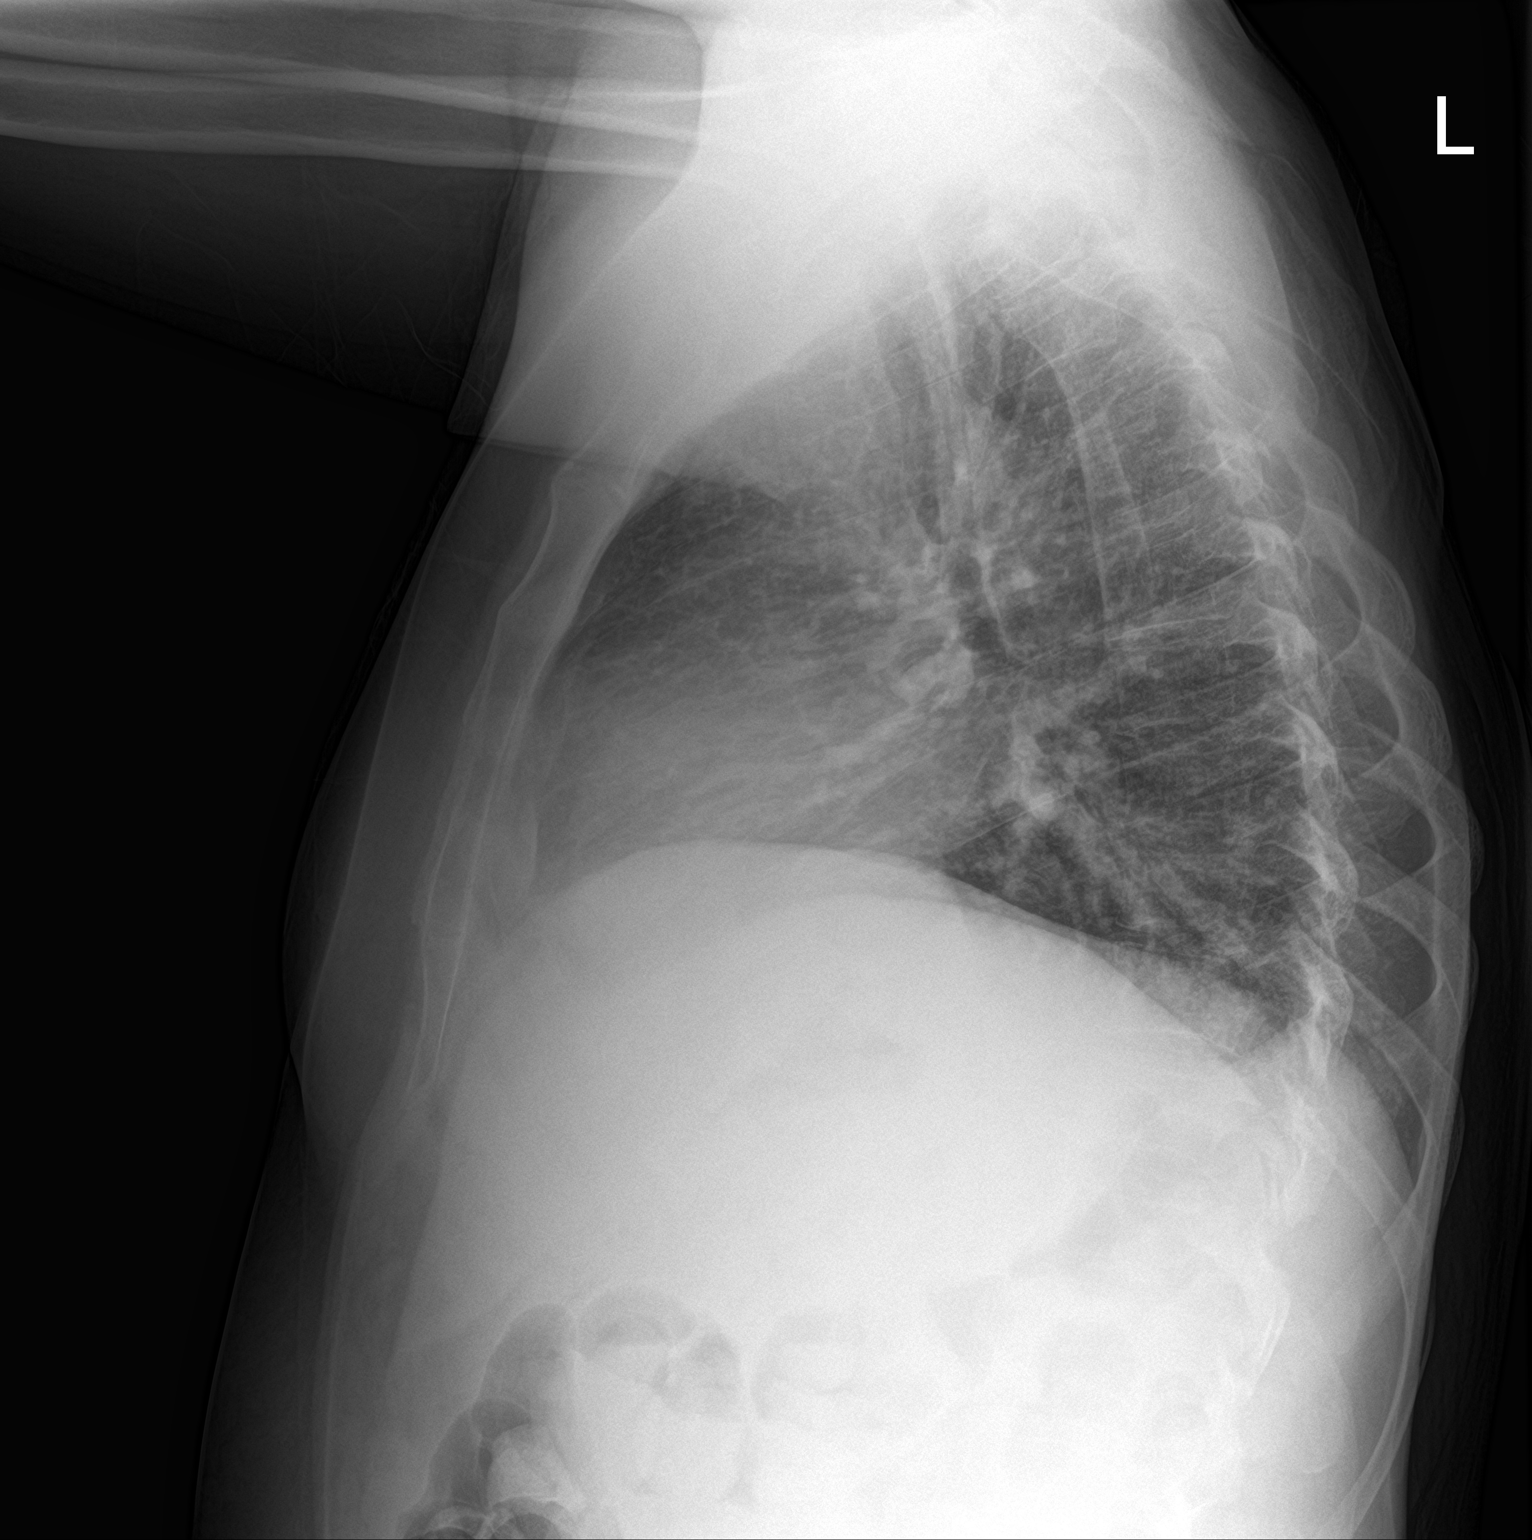

[2 of 2 positions shown; findings below may reference images not displayed]

FINDINGS: The heart size and mediastinal contours are within normal limits.
Both lungs are clear. The visualized skeletal structures are
unremarkable.
IMPRESSION: No active cardiopulmonary disease.

## 2019-03-22 IMAGING — CT CT ABD-PELV W/ CM
2 of 4 series · 15 of 46 positions shown, 17 images · IV contrast (APPLIED)
Comparison: January 30, 2015

CLINICAL DATA: Chest pain and nausea. History of cirrhosis. Recent
cocaine use.

EXAM:
CT ABDOMEN AND PELVIS WITH CONTRAST
TECHNIQUE: Multidetector CT imaging of the abdomen and pelvis was performed
using the standard protocol following bolus administration of
intravenous contrast.
CONTRAST:  100mL 9NTOKM-YCC IOPAMIDOL (9NTOKM-YCC) INJECTION 61%

[Series 2: routine abd/pel with · axial · 0.78mm/px · z∈[-587,-122]mm · 12 of 103 slices shown, 14 images]
[im 5/103  soft-tissue]
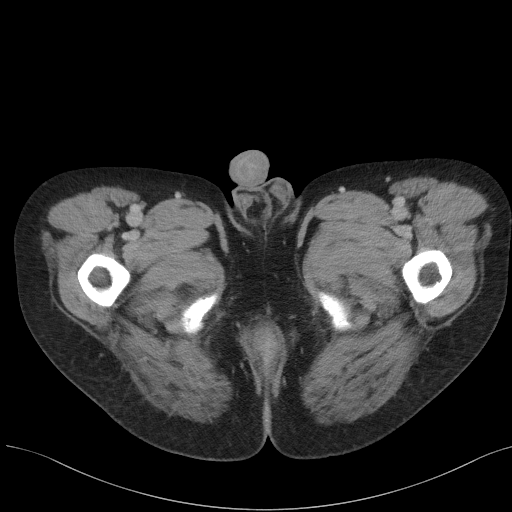
[im 5/103  bone]
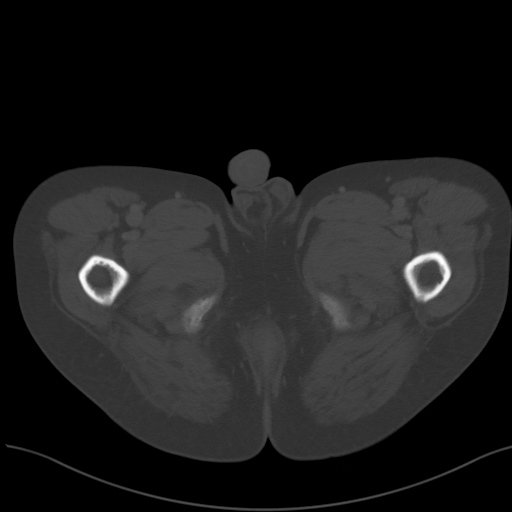
[im 14/103  soft-tissue]
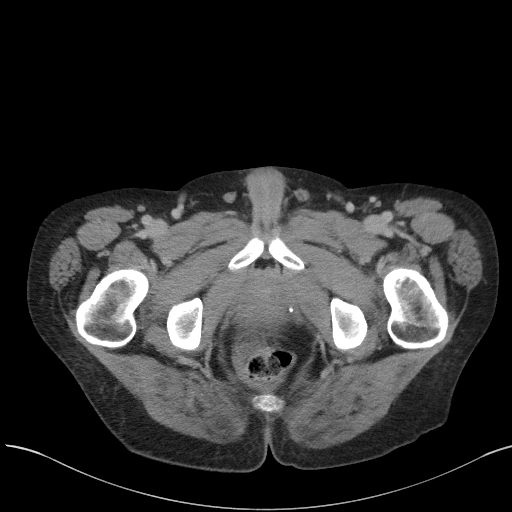
[im 24/103  soft-tissue]
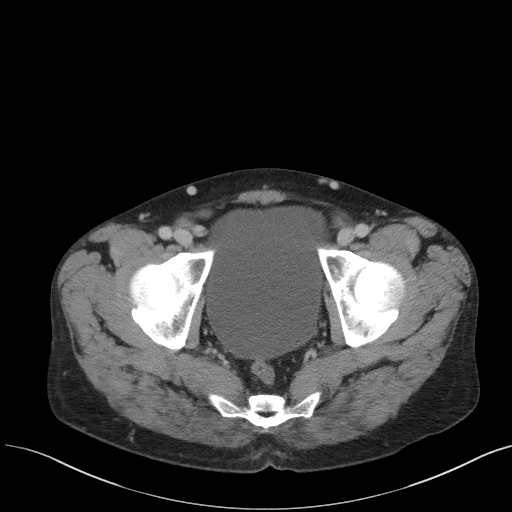
[im 33/103  soft-tissue]
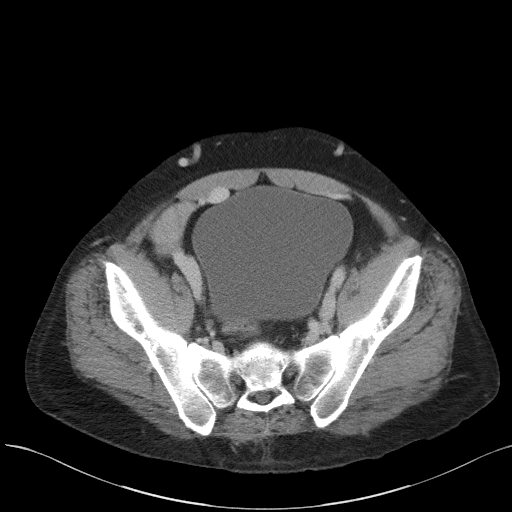
[im 38/103  soft-tissue]
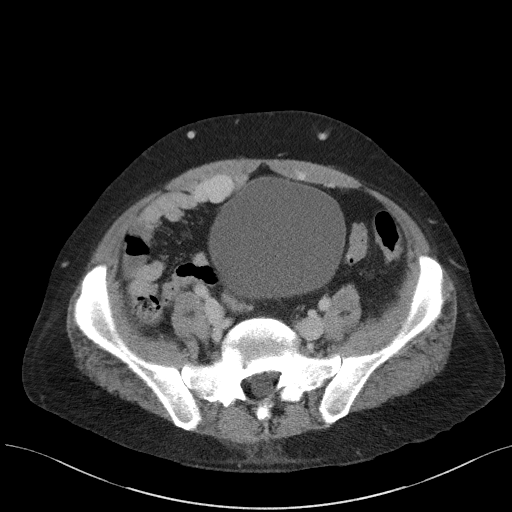
[im 47/103  soft-tissue]
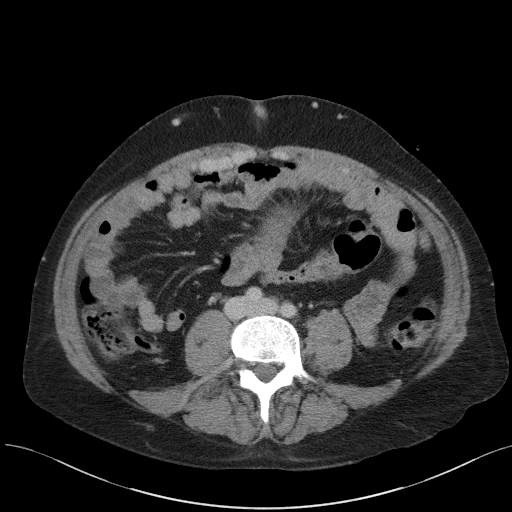
[im 56/103  soft-tissue]
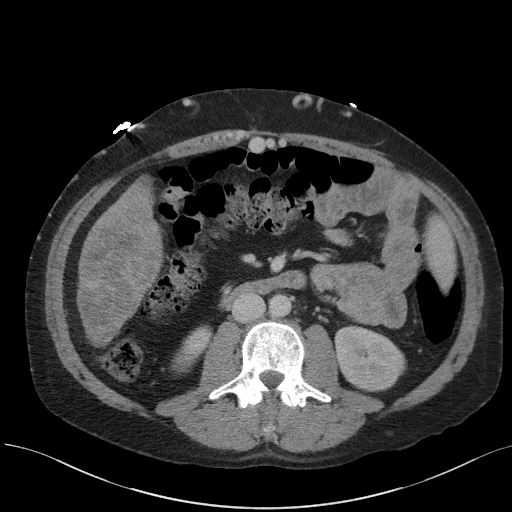
[im 65/103  soft-tissue]
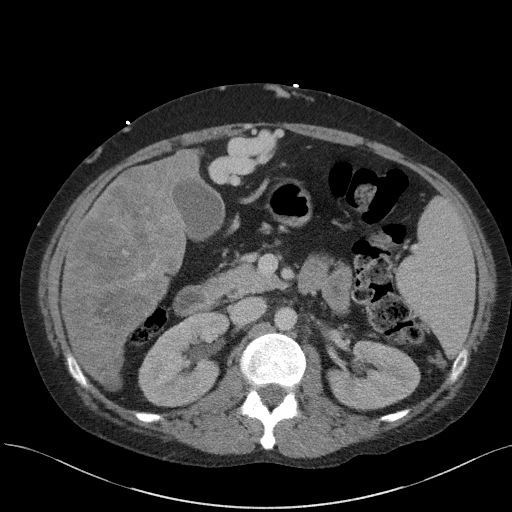
[im 70/103  soft-tissue]
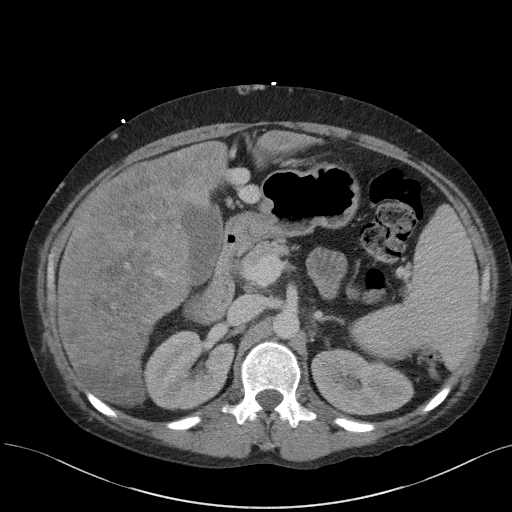
[im 70/103  bone]
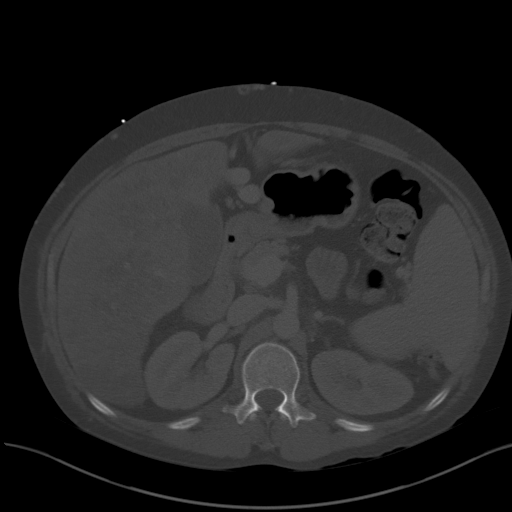
[im 79/103  soft-tissue]
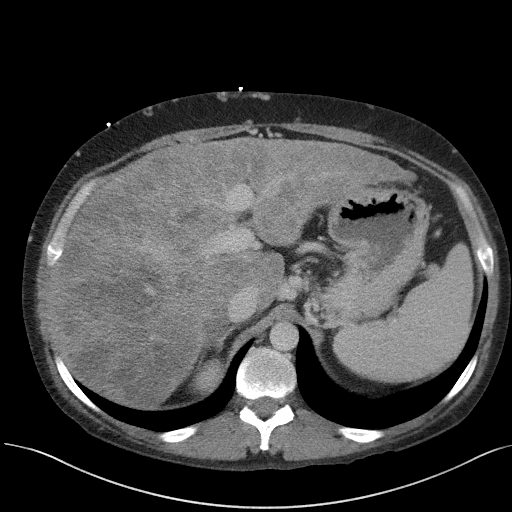
[im 89/103  soft-tissue]
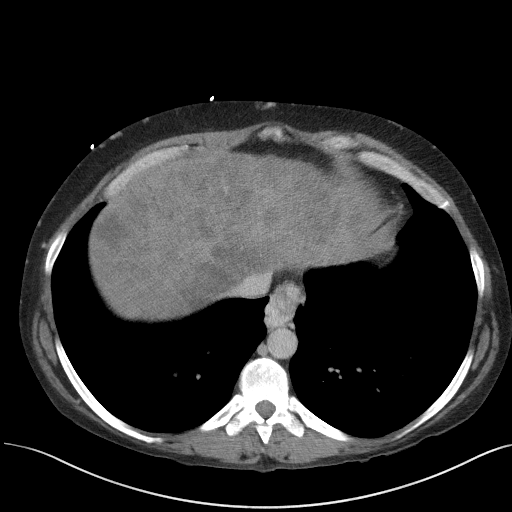
[im 98/103  soft-tissue]
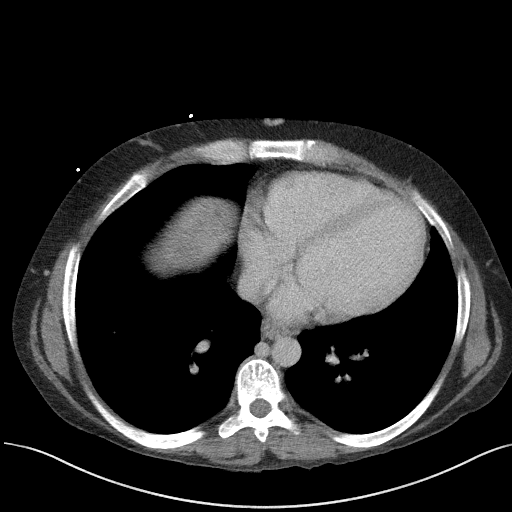

[Series 5: coronal st · coronal · 0.77mm/px · 3 of 100 slices shown]
[im 34/100  soft-tissue]
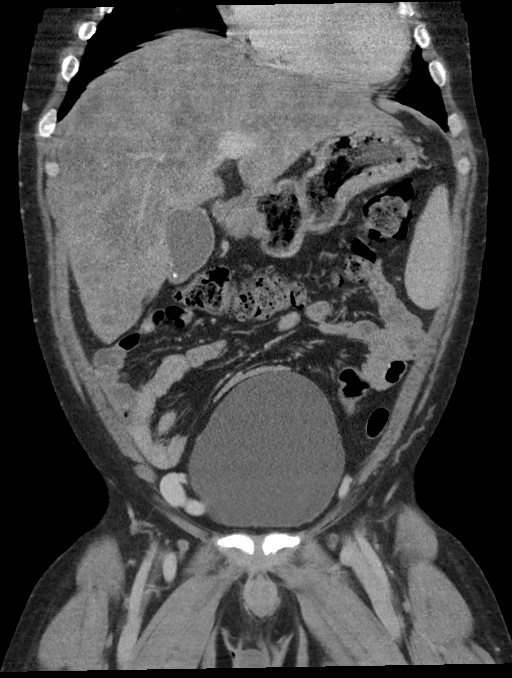
[im 45/100  soft-tissue]
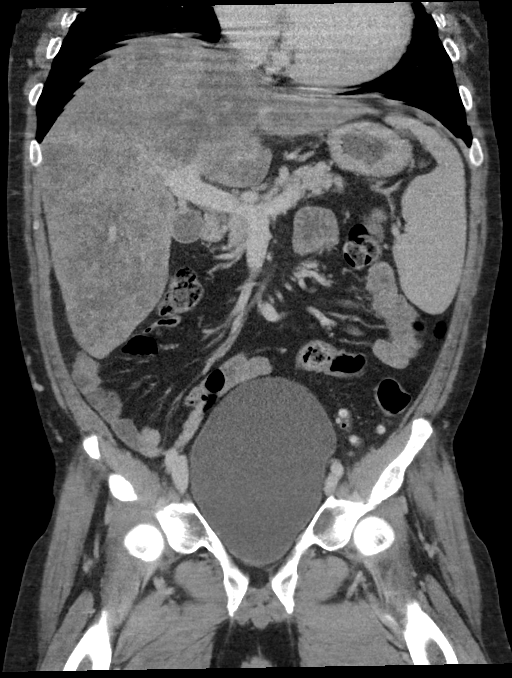
[im 56/100  soft-tissue]
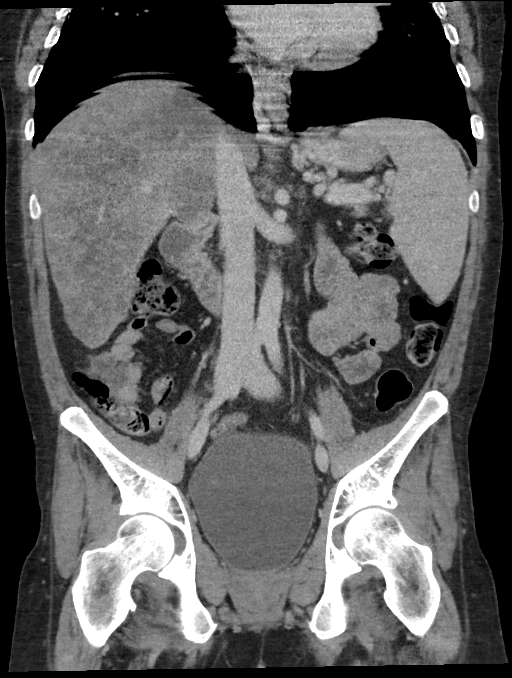

[15 of 46 positions shown; findings below may reference images not displayed]

FINDINGS: Lower chest: Respiratory motion limits evaluation of the lung bases.
Paraesophageal varices are identified. Lung bases are otherwise
unremarkable.

Hepatobiliary: The liver demonstrates a heterogeneous attenuation
with multiple rounded and confluent regions of low attenuation. This
appearance is nonspecific but very similar in appearance to the January 30, 2015 study. The portal vein remains patent. The left portal vein
is dilated compared to the other branches and there is
recannulization of the umbilical vein with collaterals in the
anterior abdominal wall. The patient's varices are more dilated
compared to the previous study. There are paraesophageal varices
identified. There is cholelithiasis.

Pancreas: Unremarkable. No pancreatic ductal dilatation or
surrounding inflammatory changes.

Spleen: The spleen measures 15.4 cm in cranial caudal dimension
which is similar to the previous study.

Adrenals/Urinary Tract: Adrenal glands are normal. No renal stones
or masses. The bladder is distended. No ureteral stones.

Stomach/Bowel: The stomach and small bowel are normal. A few
scattered colonic diverticuli are seen without diverticulitis. The
visualized appendix is normal.

Vascular/Lymphatic: The abdominal aorta is nonaneurysmal. No
adenopathy.

Reproductive: Prostate is unremarkable.

Other: No abdominal wall hernia or abnormality. No abdominopelvic
ascites.

Musculoskeletal: No acute or significant osseous findings.
IMPRESSION: 1. The liver is similar in appearance to the January 30, 2015 study. The
heterogeneous attenuation with multiple low-attenuation nodular and
confluent regions is probably due to the patient's reported
cirrhosis with regenerating nodules. The lack of significant change
in greater than 2 years would argue against a malignant process.
That being said, based on imaging alone, underlying
neoplasm/malignancy is not excluded. MRI could better assess.
2. Findings of portal venous hypertension with multiple varices
including paraesophageal varices.
3. Cholelithiasis.
4. Splenomegaly.
5. No other acute abnormalities.

## 2019-11-27 DIAGNOSIS — D696 Thrombocytopenia, unspecified: Secondary | ICD-10-CM | POA: Diagnosis present

## 2019-11-27 DIAGNOSIS — I8500 Esophageal varices without bleeding: Secondary | ICD-10-CM | POA: Diagnosis present

## 2023-03-10 ENCOUNTER — Other Ambulatory Visit: Payer: Self-pay

## 2023-03-10 ENCOUNTER — Emergency Department: Payer: 59

## 2023-03-10 ENCOUNTER — Inpatient Hospital Stay
Admission: EM | Admit: 2023-03-10 | Discharge: 2023-03-16 | DRG: 854 | Disposition: A | Discharge status: Home / Self Care | Payer: 59 | Attending: Internal Medicine | Admitting: Internal Medicine

## 2023-03-10 DIAGNOSIS — D61818 Other pancytopenia: Secondary | ICD-10-CM | POA: Diagnosis present

## 2023-03-10 DIAGNOSIS — Z89422 Acquired absence of other left toe(s): Secondary | ICD-10-CM

## 2023-03-10 DIAGNOSIS — R112 Nausea with vomiting, unspecified: Secondary | ICD-10-CM

## 2023-03-10 DIAGNOSIS — I10 Essential (primary) hypertension: Secondary | ICD-10-CM | POA: Diagnosis present

## 2023-03-10 DIAGNOSIS — F17210 Nicotine dependence, cigarettes, uncomplicated: Secondary | ICD-10-CM | POA: Diagnosis present

## 2023-03-10 DIAGNOSIS — A401 Sepsis due to streptococcus, group B: Secondary | ICD-10-CM | POA: Diagnosis not present

## 2023-03-10 DIAGNOSIS — R197 Diarrhea, unspecified: Secondary | ICD-10-CM | POA: Insufficient documentation

## 2023-03-10 DIAGNOSIS — Z801 Family history of malignant neoplasm of trachea, bronchus and lung: Secondary | ICD-10-CM

## 2023-03-10 DIAGNOSIS — I8500 Esophageal varices without bleeding: Secondary | ICD-10-CM | POA: Diagnosis present

## 2023-03-10 DIAGNOSIS — R6520 Severe sepsis without septic shock: Secondary | ICD-10-CM | POA: Diagnosis present

## 2023-03-10 DIAGNOSIS — Z89421 Acquired absence of other right toe(s): Secondary | ICD-10-CM

## 2023-03-10 DIAGNOSIS — F1020 Alcohol dependence, uncomplicated: Secondary | ICD-10-CM | POA: Diagnosis present

## 2023-03-10 DIAGNOSIS — E876 Hypokalemia: Secondary | ICD-10-CM | POA: Diagnosis present

## 2023-03-10 DIAGNOSIS — Z1152 Encounter for screening for COVID-19: Secondary | ICD-10-CM

## 2023-03-10 DIAGNOSIS — B182 Chronic viral hepatitis C: Secondary | ICD-10-CM | POA: Diagnosis present

## 2023-03-10 DIAGNOSIS — E872 Acidosis, unspecified: Secondary | ICD-10-CM | POA: Diagnosis present

## 2023-03-10 DIAGNOSIS — L97525 Non-pressure chronic ulcer of other part of left foot with muscle involvement without evidence of necrosis: Secondary | ICD-10-CM | POA: Insufficient documentation

## 2023-03-10 DIAGNOSIS — D696 Thrombocytopenia, unspecified: Secondary | ICD-10-CM | POA: Diagnosis present

## 2023-03-10 DIAGNOSIS — K8020 Calculus of gallbladder without cholecystitis without obstruction: Secondary | ICD-10-CM | POA: Diagnosis present

## 2023-03-10 DIAGNOSIS — B1920 Unspecified viral hepatitis C without hepatic coma: Secondary | ICD-10-CM

## 2023-03-10 DIAGNOSIS — K766 Portal hypertension: Secondary | ICD-10-CM | POA: Diagnosis present

## 2023-03-10 DIAGNOSIS — E669 Obesity, unspecified: Secondary | ICD-10-CM | POA: Diagnosis present

## 2023-03-10 DIAGNOSIS — L97522 Non-pressure chronic ulcer of other part of left foot with fat layer exposed: Secondary | ICD-10-CM | POA: Diagnosis present

## 2023-03-10 DIAGNOSIS — I8510 Secondary esophageal varices without bleeding: Secondary | ICD-10-CM | POA: Diagnosis present

## 2023-03-10 DIAGNOSIS — K7030 Alcoholic cirrhosis of liver without ascites: Secondary | ICD-10-CM

## 2023-03-10 DIAGNOSIS — L03116 Cellulitis of left lower limb: Secondary | ICD-10-CM | POA: Diagnosis not present

## 2023-03-10 DIAGNOSIS — Z6832 Body mass index (BMI) 32.0-32.9, adult: Secondary | ICD-10-CM

## 2023-03-10 DIAGNOSIS — A419 Sepsis, unspecified organism: Principal | ICD-10-CM

## 2023-03-10 DIAGNOSIS — D649 Anemia, unspecified: Secondary | ICD-10-CM | POA: Diagnosis present

## 2023-03-10 DIAGNOSIS — Z596 Low income: Secondary | ICD-10-CM

## 2023-03-10 DIAGNOSIS — Z8739 Personal history of other diseases of the musculoskeletal system and connective tissue: Secondary | ICD-10-CM

## 2023-03-10 LAB — COMPREHENSIVE METABOLIC PANEL
ALT: 26 U/L (ref 0–44)
AST: 71 U/L — ABNORMAL HIGH (ref 15–41)
Albumin: 3.5 g/dL (ref 3.5–5.0)
Alkaline Phosphatase: 153 U/L — ABNORMAL HIGH (ref 38–126)
Anion gap: 10 (ref 5–15)
BUN: 7 mg/dL (ref 6–20)
CO2: 20 mmol/L — ABNORMAL LOW (ref 22–32)
Calcium: 8.2 mg/dL — ABNORMAL LOW (ref 8.9–10.3)
Chloride: 114 mmol/L — ABNORMAL HIGH (ref 98–111)
Creatinine, Ser: 0.58 mg/dL — ABNORMAL LOW (ref 0.61–1.24)
GFR, Estimated: >60 mL/min (ref >60)
Glucose, Bld: 112 mg/dL — ABNORMAL HIGH (ref 70–99)
Potassium: 3.5 mmol/L (ref 3.5–5.1)
Sodium: 144 mmol/L (ref 135–145)
Total Bilirubin: 2.7 mg/dL — ABNORMAL HIGH (ref 0.3–1.2)
Total Protein: 7.4 g/dL (ref 6.5–8.1)

## 2023-03-10 LAB — APTT: aPTT: 34 seconds (ref 24–36)

## 2023-03-10 LAB — PROTIME-INR
INR: 1.5 — ABNORMAL HIGH (ref 0.8–1.2)
Prothrombin Time: 18.4 seconds — ABNORMAL HIGH (ref 11.4–15.2)

## 2023-03-10 LAB — CBC
HCT: 40.8 % (ref 39.0–52.0)
Hemoglobin: 13.3 g/dL (ref 13.0–17.0)
MCH: 29.6 pg (ref 26.0–34.0)
MCHC: 32.6 g/dL (ref 30.0–36.0)
MCV: 90.9 fL (ref 80.0–100.0)
Platelets: 52 10*3/uL — ABNORMAL LOW (ref 150–400)
RBC: 4.49 MIL/uL (ref 4.22–5.81)
RDW: 16 % — ABNORMAL HIGH (ref 11.5–15.5)
WBC: 7.4 10*3/uL (ref 4.0–10.5)
nRBC: 0 % (ref 0.0–0.2)

## 2023-03-10 LAB — LACTIC ACID, PLASMA: Lactic Acid, Venous: 1.9 mmol/L (ref 0.5–1.9)

## 2023-03-10 LAB — LIPASE, BLOOD: Lipase: 49 U/L (ref 11–51)

## 2023-03-10 MED ORDER — SODIUM CHLORIDE 0.9 % IV SOLN
2.0000 g | INTRAVENOUS | Status: DC
  Administered 2023-03-11: 2 g via INTRAVENOUS
  Filled 2023-03-10: qty 20

## 2023-03-10 MED ORDER — SODIUM CHLORIDE 0.9 % IV BOLUS
500.0000 mL | Freq: Once | INTRAVENOUS | Status: AC
  Administered 2023-03-10: 500 mL via INTRAVENOUS

## 2023-03-10 MED ORDER — IOHEXOL 350 MG/ML SOLN
100.0000 mL | Freq: Once | INTRAVENOUS | Status: AC | PRN
  Administered 2023-03-10: 100 mL via INTRAVENOUS

## 2023-03-10 MED ORDER — SODIUM CHLORIDE 0.9 % IV SOLN
500.0000 mg | INTRAVENOUS | Status: DC
  Administered 2023-03-11: 500 mg via INTRAVENOUS
  Filled 2023-03-10: qty 5

## 2023-03-10 MED ORDER — ONDANSETRON HCL 4 MG/2ML IJ SOLN
4.0000 mg | Freq: Once | INTRAMUSCULAR | Status: AC
  Administered 2023-03-10: 4 mg via INTRAVENOUS
  Filled 2023-03-10: qty 2

## 2023-03-10 NOTE — ED Triage Notes (Signed)
Pt to ed from home via POV by his friend who dropped him off. Pt has been vomiting since last night. Pt is alert and in no acute distress in triage. Pt is covered in vomit. Pt has what appears to be ligature marks on the right side of his neck and some petechiae down his chest. Pt denies any assault. Pt is pale and clammy. Pt has HX of chronic liver failure and cirrhosis.

## 2023-03-10 NOTE — ED Provider Notes (Signed)
[facility] Provider Note    Event Date/Time   First MD Initiated Contact with Patient 03/10/23 2057     (approximate)   History   Emesis   HPI  Derrick Blanchard is a 42 y.o. male extensive history of alcoholic liver cirrhosis presents to the ER after drinking alcohol today with chief complaint of nausea vomiting epigastric discomfort.  No hematemesis no melena.  No coffee-ground emesis.  He is requesting something to drink.     Physical Exam   Triage Vital Signs: ED Triage Vitals [03/10/23 2053]  Enc Vitals Group     BP 127/76     Pulse Rate (!) 115     Resp 20     Temp 98.3 F (36.8 C)     Temp Source Oral     SpO2 98 %     Weight 211 lb 10.3 oz (96 kg)     Height 5\' 8"  (1.727 m)     Head Circumference      Peak Flow      Pain Score 0     Pain Loc      Pain Edu?      Excl. in GC?     Most recent vital signs: Vitals:   03/10/23 2200 03/10/23 2230  BP:  (!) 112/55  Pulse: (!) 118 (!) 116  Resp: 14 (!) 27  Temp:    SpO2: 100% 92%     Constitutional: Alert  Eyes: Conjunctivae are normal.  Head: Atraumatic. Nose: No congestion/rhinnorhea. Mouth/Throat: Mucous membranes are moist.   Neck: Painless ROM.  Contusion pressure abrasion/ligature?  To the right anterior neck. No meningismus Cardiovascular:   Good peripheral circulation. Respiratory: Normal respiratory effort.  No retractions.  Gastrointestinal: Soft and nontender.  Musculoskeletal: Abrasion to left anterior shoulder. Neurologic:  MAE spontaneously. No gross focal neurologic deficits are appreciated.  Skin:  Skin is warm, dry and intact. No rash noted. Psychiatric: Mood and affect are normal. Speech and behavior are normal.    ED Results / Procedures / Treatments   Labs (all labs ordered are listed, but only abnormal results are displayed) Labs Reviewed  COMPREHENSIVE METABOLIC PANEL - Abnormal; Notable for the following components:      Result Value    Chloride 114 (*)    CO2 20 (*)    Glucose, Bld 112 (*)    Creatinine, Ser 0.58 (*)    Calcium 8.2 (*)    AST 71 (*)    Alkaline Phosphatase 153 (*)    Total Bilirubin 2.7 (*)    All other components within normal limits  CBC - Abnormal; Notable for the following components:   RDW 16.0 (*)    Platelets 52 (*)    All other components within normal limits  RESP PANEL BY RT-PCR (RSV, FLU A&B, COVID)  RVPGX2  CULTURE, BLOOD (ROUTINE X 2)  CULTURE, BLOOD (ROUTINE X 2)  LIPASE, BLOOD  URINALYSIS, ROUTINE W REFLEX MICROSCOPIC  ETHANOL  LACTIC ACID, PLASMA  LACTIC ACID, PLASMA  PROTIME-INR  APTT  URINALYSIS, W/ REFLEX TO CULTURE (INFECTION SUSPECTED)     EKG  ED ECG REPORT I, Willy Eddy, the attending physician, personally viewed and interpreted this ECG.   Date: 03/10/2023  EKG Time: 21:03  Rate: 115  Rhythm: sinus  Axis: normal  Intervals: normal  ST&T Change: no stemi, no depressions    RADIOLOGY Please see ED Course for my review and interpretation.  I personally reviewed all radiographic images  ordered to evaluate for the above acute complaints and reviewed radiology reports and findings.  These findings were personally discussed with the patient.  Please see medical record for radiology report.    PROCEDURES:  Critical Care performed: Yes, see critical care procedure note(s)  .Critical Care  Performed by: Willy Eddy, MD Authorized by: Willy Eddy, MD   Critical care provider statement:    Critical care time (minutes):  35   Critical care was necessary to treat or prevent imminent or life-threatening deterioration of the following conditions:  Sepsis   Critical care was time spent personally by me on the following activities:  Ordering and performing treatments and interventions, ordering and review of laboratory studies, ordering and review of radiographic studies, pulse oximetry, re-evaluation of patient's condition, review of old charts,  obtaining history from patient or surrogate, examination of patient, evaluation of patient's response to treatment, discussions with primary provider, discussions with consultants and development of treatment plan with patient or surrogate    MEDICATIONS ORDERED IN ED: Medications  cefTRIAXone (ROCEPHIN) 2 g in sodium chloride 0.9 % 100 mL IVPB (has no administration in time range)  azithromycin (ZITHROMAX) 500 mg in sodium chloride 0.9 % 250 mL IVPB (has no administration in time range)  iohexol (OMNIPAQUE) 350 MG/ML injection 100 mL (has no administration in time range)  sodium chloride 0.9 % bolus 500 mL (0 mLs Intravenous Stopped 03/10/23 2252)  ondansetron (ZOFRAN) injection 4 mg (4 mg Intravenous Given 03/10/23 2121)     IMPRESSION / MDM / ASSESSMENT AND PLAN / ED COURSE  I reviewed the triage vital signs and the nursing notes.                              Differential diagnosis includes, but is not limited to, enteritis, gastritis, cirrhosis, alcohol abuse, pancreatitis  Patient presenting to the ER for evaluation of symptoms as described above.  Based on symptoms, risk factors and considered above differential, this presenting complaint could reflect a potentially life-threatening illness therefore the patient will be placed on continuous pulse oximetry and telemetry for monitoring.  Laboratory evaluation will be sent to evaluate for the above complaints.     Clinical Course as of 03/10/23 2338  Caleen Essex Mar 10, 2023  2152 Chest x-ray on my review and interpretation without evidence of infiltrate or consolidation. [PR]  2250 Patient found to be febrile to 102.  Still tachycardic.  No leukocytosis.  Will add on viral panel will add on septic workup will give Rocephin given history of cirrhosis.  Will follow-up imaging. [PR]  2307 Patient will require admission. [PR]    Clinical Course User Index [PR] Willy Eddy, MD   Patient signed out to oncoming physician pending follow-up  imaging.  FINAL CLINICAL IMPRESSION(S) / ED DIAGNOSES   Final diagnoses:  Sepsis, due to unspecified organism, unspecified whether acute organ dysfunction present [facility])     Rx / DC Orders   ED Discharge Orders     None        Note:  This document was prepared using Dragon voice recognition software and may include unintentional dictation errors.    Willy Eddy, MD 03/10/23 [phone]

## 2023-03-11 ENCOUNTER — Emergency Department: Payer: 59

## 2023-03-11 ENCOUNTER — Inpatient Hospital Stay: Payer: 59

## 2023-03-11 DIAGNOSIS — A419 Sepsis, unspecified organism: Secondary | ICD-10-CM

## 2023-03-11 DIAGNOSIS — K766 Portal hypertension: Secondary | ICD-10-CM | POA: Diagnosis present

## 2023-03-11 DIAGNOSIS — Z1152 Encounter for screening for COVID-19: Secondary | ICD-10-CM | POA: Diagnosis not present

## 2023-03-11 DIAGNOSIS — R6520 Severe sepsis without septic shock: Secondary | ICD-10-CM | POA: Diagnosis present

## 2023-03-11 DIAGNOSIS — F17210 Nicotine dependence, cigarettes, uncomplicated: Secondary | ICD-10-CM | POA: Diagnosis present

## 2023-03-11 DIAGNOSIS — R112 Nausea with vomiting, unspecified: Secondary | ICD-10-CM | POA: Diagnosis not present

## 2023-03-11 DIAGNOSIS — L97525 Non-pressure chronic ulcer of other part of left foot with muscle involvement without evidence of necrosis: Secondary | ICD-10-CM | POA: Insufficient documentation

## 2023-03-11 DIAGNOSIS — L97522 Non-pressure chronic ulcer of other part of left foot with fat layer exposed: Secondary | ICD-10-CM

## 2023-03-11 DIAGNOSIS — Z89422 Acquired absence of other left toe(s): Secondary | ICD-10-CM | POA: Diagnosis not present

## 2023-03-11 DIAGNOSIS — D696 Thrombocytopenia, unspecified: Secondary | ICD-10-CM

## 2023-03-11 DIAGNOSIS — A401 Sepsis due to streptococcus, group B: Secondary | ICD-10-CM | POA: Diagnosis present

## 2023-03-11 DIAGNOSIS — E872 Acidosis, unspecified: Secondary | ICD-10-CM | POA: Diagnosis present

## 2023-03-11 DIAGNOSIS — D61818 Other pancytopenia: Secondary | ICD-10-CM | POA: Diagnosis present

## 2023-03-11 DIAGNOSIS — Z89421 Acquired absence of other right toe(s): Secondary | ICD-10-CM | POA: Diagnosis not present

## 2023-03-11 DIAGNOSIS — K8020 Calculus of gallbladder without cholecystitis without obstruction: Secondary | ICD-10-CM | POA: Diagnosis present

## 2023-03-11 DIAGNOSIS — K7030 Alcoholic cirrhosis of liver without ascites: Secondary | ICD-10-CM

## 2023-03-11 DIAGNOSIS — B182 Chronic viral hepatitis C: Secondary | ICD-10-CM | POA: Diagnosis present

## 2023-03-11 DIAGNOSIS — L03116 Cellulitis of left lower limb: Secondary | ICD-10-CM | POA: Diagnosis present

## 2023-03-11 DIAGNOSIS — Z6832 Body mass index (BMI) 32.0-32.9, adult: Secondary | ICD-10-CM | POA: Diagnosis not present

## 2023-03-11 DIAGNOSIS — E876 Hypokalemia: Secondary | ICD-10-CM | POA: Diagnosis not present

## 2023-03-11 DIAGNOSIS — E669 Obesity, unspecified: Secondary | ICD-10-CM | POA: Diagnosis present

## 2023-03-11 DIAGNOSIS — Z8739 Personal history of other diseases of the musculoskeletal system and connective tissue: Secondary | ICD-10-CM | POA: Diagnosis not present

## 2023-03-11 DIAGNOSIS — F1020 Alcohol dependence, uncomplicated: Secondary | ICD-10-CM | POA: Diagnosis present

## 2023-03-11 DIAGNOSIS — I8510 Secondary esophageal varices without bleeding: Secondary | ICD-10-CM | POA: Diagnosis present

## 2023-03-11 DIAGNOSIS — Z801 Family history of malignant neoplasm of trachea, bronchus and lung: Secondary | ICD-10-CM | POA: Diagnosis not present

## 2023-03-11 DIAGNOSIS — B1920 Unspecified viral hepatitis C without hepatic coma: Secondary | ICD-10-CM

## 2023-03-11 DIAGNOSIS — I10 Essential (primary) hypertension: Secondary | ICD-10-CM | POA: Diagnosis present

## 2023-03-11 DIAGNOSIS — Z596 Low income: Secondary | ICD-10-CM | POA: Diagnosis not present

## 2023-03-11 LAB — CORTISOL-AM, BLOOD: Cortisol - AM: 11.2 ug/dL (ref 6.7–22.6)

## 2023-03-11 LAB — ETHANOL: Alcohol, Ethyl (B): 50 mg/dL — ABNORMAL HIGH (ref <10)

## 2023-03-11 LAB — URINALYSIS, W/ REFLEX TO CULTURE (INFECTION SUSPECTED)
Bacteria, UA: NONE SEEN
Bilirubin Urine: NEGATIVE
Glucose, UA: NEGATIVE mg/dL
Hgb urine dipstick: NEGATIVE
Ketones, ur: NEGATIVE mg/dL
Leukocytes,Ua: NEGATIVE
Nitrite: NEGATIVE
Protein, ur: NEGATIVE mg/dL
Specific Gravity, Urine: 1.006 (ref 1.005–1.030)
Squamous Epithelial / HPF: NONE SEEN /HPF (ref 0–5)
pH: 6 (ref 5.0–8.0)

## 2023-03-11 LAB — PROCALCITONIN: Procalcitonin: 1.45 ng/mL

## 2023-03-11 LAB — RESP PANEL BY RT-PCR (RSV, FLU A&B, COVID)  RVPGX2
Influenza A by PCR: NEGATIVE
Influenza B by PCR: NEGATIVE
Resp Syncytial Virus by PCR: NEGATIVE
SARS Coronavirus 2 by RT PCR: NEGATIVE

## 2023-03-11 LAB — HIV ANTIBODY (ROUTINE TESTING W REFLEX): HIV Screen 4th Generation wRfx: NONREACTIVE

## 2023-03-11 LAB — AEROBIC CULTURE W GRAM STAIN (SUPERFICIAL SPECIMEN)

## 2023-03-11 LAB — LACTIC ACID, PLASMA: Lactic Acid, Venous: 2.8 mmol/L (ref 0.5–1.9)

## 2023-03-11 MED ORDER — VANCOMYCIN HCL 1250 MG/250ML IV SOLN
1250.0000 mg | Freq: Two times a day (BID) | INTRAVENOUS | Status: DC
  Administered 2023-03-11 – 2023-03-13 (×3): 1250 mg via INTRAVENOUS
  Filled 2023-03-11 (×6): qty 250

## 2023-03-11 MED ORDER — LORAZEPAM 2 MG/ML IJ SOLN
1.0000 mg | INTRAMUSCULAR | Status: DC | PRN
  Administered 2023-03-11: 3 mg via INTRAVENOUS
  Administered 2023-03-12: 2 mg via INTRAVENOUS
  Administered 2023-03-13: 1 mg via INTRAVENOUS
  Filled 2023-03-11: qty 2
  Filled 2023-03-11 (×2): qty 1

## 2023-03-11 MED ORDER — ACETAMINOPHEN 650 MG RE SUPP: 650.0000 mg | Freq: Four times a day (QID) | RECTAL | Status: DC | PRN

## 2023-03-11 MED ORDER — LORAZEPAM 1 MG PO TABS
1.0000 mg | ORAL_TABLET | ORAL | Status: DC | PRN
  Administered 2023-03-11 – 2023-03-12 (×2): 2 mg via ORAL
  Administered 2023-03-12 – 2023-03-13 (×2): 1 mg via ORAL
  Filled 2023-03-11: qty 1
  Filled 2023-03-11: qty 2
  Filled 2023-03-11: qty 1
  Filled 2023-03-11: qty 2

## 2023-03-11 MED ORDER — ONDANSETRON HCL 4 MG PO TABS
4.0000 mg | ORAL_TABLET | Freq: Four times a day (QID) | ORAL | Status: DC | PRN
  Administered 2023-03-11: 4 mg via ORAL
  Filled 2023-03-11: qty 1

## 2023-03-11 MED ORDER — THIAMINE MONONITRATE 100 MG PO TABS
100.0000 mg | ORAL_TABLET | Freq: Every day | ORAL | Status: DC
  Administered 2023-03-11 – 2023-03-16 (×6): 100 mg via ORAL
  Filled 2023-03-11 (×6): qty 1

## 2023-03-11 MED ORDER — FOLIC ACID 1 MG PO TABS
1.0000 mg | ORAL_TABLET | Freq: Every day | ORAL | Status: DC
  Administered 2023-03-11 – 2023-03-16 (×6): 1 mg via ORAL
  Filled 2023-03-11 (×6): qty 1

## 2023-03-11 MED ORDER — THIAMINE HCL 100 MG/ML IJ SOLN
100.0000 mg | Freq: Every day | INTRAMUSCULAR | Status: DC
  Filled 2023-03-11: qty 2

## 2023-03-11 MED ORDER — VANCOMYCIN HCL 2000 MG/400ML IV SOLN
2000.0000 mg | Freq: Once | INTRAVENOUS | Status: AC
  Administered 2023-03-11: 2000 mg via INTRAVENOUS
  Filled 2023-03-11: qty 400

## 2023-03-11 MED ORDER — LACTATED RINGERS IV SOLN: INTRAVENOUS | Status: AC

## 2023-03-11 MED ORDER — METRONIDAZOLE 500 MG/100ML IV SOLN
500.0000 mg | Freq: Two times a day (BID) | INTRAVENOUS | Status: DC
  Administered 2023-03-11: 500 mg via INTRAVENOUS
  Filled 2023-03-11: qty 100

## 2023-03-11 MED ORDER — SODIUM CHLORIDE 0.9 % IV SOLN: 2.0000 g | Freq: Three times a day (TID) | INTRAVENOUS | Status: DC

## 2023-03-11 MED ORDER — IBUPROFEN 600 MG PO TABS
600.0000 mg | ORAL_TABLET | Freq: Once | ORAL | Status: AC
  Administered 2023-03-11: 600 mg via ORAL
  Filled 2023-03-11: qty 1

## 2023-03-11 MED ORDER — PANTOPRAZOLE SODIUM 40 MG IV SOLR
40.0000 mg | INTRAVENOUS | Status: DC
  Administered 2023-03-11 – 2023-03-16 (×6): 40 mg via INTRAVENOUS
  Filled 2023-03-11 (×6): qty 10

## 2023-03-11 MED ORDER — ADULT MULTIVITAMIN W/MINERALS CH
1.0000 | ORAL_TABLET | Freq: Every day | ORAL | Status: DC
  Administered 2023-03-11 – 2023-03-16 (×6): 1 via ORAL
  Filled 2023-03-11 (×6): qty 1

## 2023-03-11 MED ORDER — ORAL CARE MOUTH RINSE: 15.0000 mL | OROMUCOSAL | Status: DC | PRN

## 2023-03-11 MED ORDER — ACETAMINOPHEN 325 MG PO TABS
650.0000 mg | ORAL_TABLET | Freq: Four times a day (QID) | ORAL | Status: DC | PRN
  Administered 2023-03-11 – 2023-03-12 (×2): 650 mg via ORAL
  Filled 2023-03-11 (×2): qty 2

## 2023-03-11 MED ORDER — SODIUM CHLORIDE 0.9 % IV SOLN
12.5000 mg | Freq: Once | INTRAVENOUS | Status: AC
  Administered 2023-03-11: 12.5 mg via INTRAVENOUS
  Filled 2023-03-11: qty 0.5

## 2023-03-11 MED ORDER — ONDANSETRON HCL 4 MG/2ML IJ SOLN
4.0000 mg | Freq: Four times a day (QID) | INTRAMUSCULAR | Status: DC | PRN
  Administered 2023-03-16: 4 mg via INTRAVENOUS
  Filled 2023-03-11: qty 2

## 2023-03-11 MED ORDER — OXYCODONE HCL 5 MG PO TABS
5.0000 mg | ORAL_TABLET | ORAL | Status: DC | PRN
  Administered 2023-03-11 – 2023-03-16 (×13): 5 mg via ORAL
  Filled 2023-03-11 (×13): qty 1

## 2023-03-11 MED ORDER — VANCOMYCIN HCL IN DEXTROSE 1-5 GM/200ML-% IV SOLN: 1000.0000 mg | Freq: Once | INTRAVENOUS | Status: DC

## 2023-03-11 MED ORDER — SODIUM CHLORIDE 0.9 % IV BOLUS
1000.0000 mL | Freq: Once | INTRAVENOUS | Status: AC
  Administered 2023-03-11: 1000 mL via INTRAVENOUS

## 2023-03-11 MED ORDER — ENSURE ENLIVE PO LIQD
237.0000 mL | Freq: Two times a day (BID) | ORAL | Status: DC
  Administered 2023-03-11 – 2023-03-16 (×8): 237 mL via ORAL

## 2023-03-11 MED ORDER — SODIUM CHLORIDE 0.9 % IV SOLN
2.0000 g | Freq: Three times a day (TID) | INTRAVENOUS | Status: DC
  Administered 2023-03-11 – 2023-03-13 (×7): 2 g via INTRAVENOUS
  Filled 2023-03-11 (×10): qty 12.5

## 2023-03-11 NOTE — Consult Note (Signed)
WOC Nurse Consult Note: Reason for Consult:left foot, plantar aspect, 2nd metatarsal head, full thickness wound. Previous 2nd left digit amputation. WOC Nursing is simultaneous consulted with Podiatric Medicine and Dr. Vinnie Level has seen the patient today and will follow. Dr. Ralene Cork has indicated that Nursing is to perform daily betadine dressing changes; I have transcribed her order to the Nursing Orders. Wound type:neuropathic, infectious Pressure Injury POA: NA Measurement:2cm x 2.5cm x 0.5cm Wound AOZ:HYQM red, moist Drainage (amount, consistency, odor) serous, small Periwound:peeling, dry Dressing procedure/placement/frequency:Dr,. Ralene Cork has indicated that wound care is to be daily with a betadine dressing, If Betadine solution is not available at the bedside, I have provided Nursing with guidance to cleanse the wound with NS and then apply a povidone-iodine swabstick to the area, allow it to air dry and then dress with a dry dressing.  WOC nursing team will not follow, but will remain available to this patient, the nursing and medical teams.  Please re-consult if needed.  Thank you for inviting [place] to participate in this patient's Plan of Care.  Ladona Mow, MSN, RN, CNS, GNP, Leda Min, [employer], [employer] phone:  [phone]

## 2023-03-11 NOTE — Progress Notes (Signed)
CODE SEPSIS - PHARMACY COMMUNICATION  **Broad Spectrum Antibiotics should be administered within 1 hour of Sepsis diagnosis**  Time Code Sepsis Called/Page Received: 7/6 @ 0126  Antibiotics Ordered: Azithromycin, Ceftriaxone   Time of 1st antibiotic administration: Ceftriaxone 2 gm IV X 1 on 7/6 @ 0005  Additional action taken by pharmacy:   If necessary, Name of Provider/Nurse Contacted:     Aaniyah Strohm D ,PharmD Clinical Pharmacist  03/11/2023  1:45 AM

## 2023-03-11 NOTE — Progress Notes (Signed)
PHARMACY -  BRIEF ANTIBIOTIC NOTE   Pharmacy has received consult(s) for Vancomycin from an ED provider.  The patient's profile has been reviewed for ht/wt/allergies/indication/available labs.    One time order(s) placed for Vancomycin 2 gm IV X 1  Further antibiotics/pharmacy consults should be ordered by admitting physician if indicated.                       Thank you, Haygen Zebrowski D 03/11/2023  1:30 AM

## 2023-03-11 NOTE — Progress Notes (Signed)
Pharmacy Antibiotic Note  Derrick Blanchard is a 42 y.o. male admitted on 03/10/2023 with cellulitis.  Pharmacy has been consulted for Vancomycin, Cefepime  dosing.  Plan: Continue cefepime 2 gm IV Q8H   Vancomycin 2 g IV x 1 loading dose followed by 1250 mg q12H. Will adjust to vancomycin 1500 mg q12H. Pt is young, and has good renal function and BMI is 32. Plan to obtain vancomycin level after 4th or 5th dose. Predicted AUC of 417. Goal 400-600. Vd 0.72. IBW, Scr 0.8.   Height: 5\' 8"  (172.7 cm) Weight: 96 kg (211 lb 10.3 oz) IBW/kg (Calculated) : 68.4  Temp (24hrs), Avg:99.6 F (37.6 C), Min:97.9 F (36.6 C), Max:102 F (38.9 C)  Recent Labs  Lab 03/10/23 2058 03/10/23 2323  WBC 7.4  --   CREATININE 0.58*  --   LATICACIDVEN  --  1.9     Estimated Creatinine Clearance: 139.2 mL/min (A) (by C-G formula based on SCr of 0.58 mg/dL (L)).    No Known Allergies  Antimicrobials this admission: 7/6 cefepime >>  7/6 vancomycin  >>   Dose adjustments this admission: None  Microbiology results: 7/5 BCx: pending 7/6 Foot cx: pending   Thank you for allowing pharmacy to be a part of this patient's care.  Ronnald Ramp, PharmD, BCPS 03/11/2023 9:22 AM

## 2023-03-11 NOTE — Plan of Care (Signed)
MRI reviewed. No clear signs of osteomyelitis and no abscess. So will plan to continue with wound care and antibiotics for time being and no surgical intervention planned at this time.

## 2023-03-11 NOTE — Assessment & Plan Note (Addendum)
Infected ulcer left foot with fat layer exposed History of osteomyelitis s/p amputation digits right and left feet MRI did not show any drainable abscess or osteomyelitis.  Podiatry signed off and will follow-up as outpatient for wound care.

## 2023-03-11 NOTE — Assessment & Plan Note (Addendum)
No further nausea or vomiting. 

## 2023-03-11 NOTE — Assessment & Plan Note (Signed)
Present on admission and with lactic acidosis, thrombocytopenia, elevated bilirubin, fever of 102, tachycardia and tachypnea.  Continue cefepime and vancomycin.

## 2023-03-11 NOTE — Assessment & Plan Note (Addendum)
Portal hypertension with varices Chronic thrombocytopenia Chronic hepatitis C Elevated liver function test.  AST 71, bili 2.7, platelets 29,000. Patient must stop drinking in order to be a candidate for transplant. Right upper quadrant consistent with cirrhosis.  They did not comment on ascites.

## 2023-03-11 NOTE — Hospital Course (Signed)
42 y.o. male with medical history significant for Severe alcohol use disorder with history of DTs, cirrhosis with esophageal varices, , chronic HCV, hypertension, chronic lower extremity wounds with history of severe sepsis secondary to osteomyelitis 03/2022 s/p amputation of all right toes and second left toe MTP joint , who presents to the ED with a 1 day history of nausea vomiting and epigastric discomfort.  Emesis was nonbloody, non-coffee-ground.  He has a history of hemorrhoidal bleeding per record review but denies black or bloody stool.  He has had no fever or chills.  He endorses chronic swelling of the left lower extremity and a known wound.   7/6.  Patient lactic acid is elevated on IV fluids.  On antibiotics.  MRI did not show any osteomyelitis or drainable fluid collection.  Patient had an episode of vomiting after lunch. 7/7.  No further vomiting.  Advance diet.  Continue antibiotics for draining foot ulcer. 7/8.  Patient complaining of right upper quadrant pain and foot pain.  Right upper quadrant ultrasound ordered.

## 2023-03-11 NOTE — ED Provider Notes (Addendum)
Care assumed of patient from outgoing provider.  See their note for initial history, exam and plan.  Clinical Course as of 03/11/23 0205  Caleen Essex Mar 10, 2023  2152 Chest x-ray on my review and interpretation without evidence of infiltrate or consolidation. [PR]  2250 Patient found to be febrile to 102.  Still tachycardic.  No leukocytosis.  Will add on viral panel will add on septic workup will give Rocephin given history of cirrhosis.  Will follow-up imaging. [PR]  2307 Patient will require admission. [PR]  Sat Mar 11, 2023  0038 PMH of Etoh abuse and known cirrhosis, presented not feeling well - found to have a fever of 102, tachycardic. Blood cx obtained and give IV abx for CAP.  Given ivf and nausea medications.  CT pending.   CT w/o signs of acute process - signs of cirrhosis w/ varices.  UA w/o signs of UTI.  Concern for possible aspiration PNA.  Admit.   [SM]    Clinical Course User Index [PR] Willy Eddy, MD [SM] Corena Herter, MD   On reevaluation patient with a wound to his left foot.  Surrounding erythema and warmth.  Concern for possible cellulitis from foot ulceration.  History of amputations.  Repeat blood pressure remains a MAP of 74.  Broaden antibiotics to cover with vancomycin for possible cellulitis secondary to foot ulcer.  Good peripheral pulses.  Tolerating p.o.  Consulted hospitalist for admission for cellulitis and foot ulcer  Corena Herter, MD 03/11/23 0040    Corena Herter, MD 03/11/23 4098

## 2023-03-11 NOTE — Progress Notes (Signed)
Robin R. Reported to this RN patient's most recent lactic acid of 2.8. Dr. Renae Gloss paged via Amion.

## 2023-03-11 NOTE — Sepsis Progress Note (Signed)
Elink monitoring for the code sepsis protocol.  

## 2023-03-11 NOTE — Progress Notes (Signed)
Progress Note   Patient: Derrick Blanchard VWU:[id number] DOB: [birth year] DOA: 03/10/2023     0 DOS: the patient was seen and examined on 03/11/2023   Brief hospital course: 42 y.o. male with medical history significant for Severe alcohol use disorder with history of DTs, cirrhosis with esophageal varices, , chronic HCV, hypertension, chronic lower extremity wounds with history of severe sepsis secondary to osteomyelitis 03/2022 s/p amputation of all right toes and second left toe MTP joint , who presents to the ED with a 1 day history of nausea vomiting and epigastric discomfort.  Emesis was nonbloody, non-coffee-ground.  He has a history of hemorrhoidal bleeding per record review but denies black or bloody stool.  He has had no fever or chills.  He endorses chronic swelling of the left lower extremity and a known wound.   7/6.  Patient lactic acid is elevated on IV fluids.  On antibiotics.  Case discussed with podiatry and we ordered an MRI of his foot.   Assessment and Plan: * Severe sepsis (HCC) Present on admission and with lactic acidosis, thrombocytopenia, elevated bilirubin, fever of 102, tachycardia and tachypnea.  Continue cefepime and vancomycin.  Cellulitis of left lower extremity Infected ulcer left foot with fat layer exposed History of osteomyelitis s/p amputation digits right and left feet Case discussed with podiatry and we ordered a MRI of the foot.  Continue antibiotics.  Also seen cellulitis left groin.  Nausea and vomiting Improved.  Start diet.  Alcoholic cirrhosis of liver (HCC) Portal hypertension with varices Chronic thrombocytopenia Chronic hepatitis C AST 71, bili 2.7, platelets 52,000. Patient must stop drinking in order to be a candidate for transplant.  Alcohol use disorder, severe, dependence (HCC) CIWA withdrawal protocol Folate thiamine and MVI        Subjective: Patient came in with nausea vomiting some abdominal pain.  History of cirrhosis.   Also has a draining foot ulcer with x-ray showing 2 cm focus of gas.  Physical Exam: Vitals:   03/11/23 0530 03/11/23 0630 03/11/23 0745 03/11/23 0853  BP: 102/63 101/63 109/70 120/79  Pulse: 88 85 67 75  Resp: 18 13 15    Temp:   97.9 F (36.6 C)   TempSrc:   Oral   SpO2: 95% 94% 97% 100%  Weight:      Height:       Physical Exam HENT:     Head: Normocephalic.     Mouth/Throat:     Pharynx: No oropharyngeal exudate.  Eyes:     General: Lids are normal.     Conjunctiva/sclera: Conjunctivae normal.  Cardiovascular:     Rate and Rhythm: Normal rate and regular rhythm.     Heart sounds: Normal heart sounds, S1 normal and S2 normal.  Pulmonary:     Breath sounds: No decreased breath sounds, wheezing, rhonchi or rales.  Abdominal:     Palpations: Abdomen is soft.     Tenderness: There is no abdominal tenderness.  Musculoskeletal:     Right lower leg: Swelling present.     Left lower leg: Swelling present.  Skin:    General: Skin is warm.     Comments: Chronic lower extremity skin discoloration.  Erythema in the left groin.  Left foot covered.  Neurological:     Mental Status: He is alert and oriented to person, place, and time.     Data Reviewed: Creatinine 0.58, alkaline phosphatase 153, AST 71, total bilirubin 2.7, lactic acid 2.8, procalcitonin 1.45, white blood cell count 7.4,  platelet count 52  Family Communication: Deferred  Disposition: Status is: Inpatient Remains inpatient appropriate because: Patient being treated for severe sepsis with IV antibiotics.  MRI of the foot ordered  Planned Discharge Destination: Home    Time spent: 28 minutes  Author: Alford Highland, MD 03/11/2023 12:58 PM  For on call review [email or web address].

## 2023-03-11 NOTE — Assessment & Plan Note (Signed)
Change Ativan orally to as needed. Folate thiamine and MVI

## 2023-03-11 NOTE — Progress Notes (Signed)
Pharmacy Antibiotic Note  Derrick Blanchard is a 42 y.o. male admitted on 03/10/2023 with cellulitis.  Pharmacy has been consulted for Vancomycin, Cefepime  dosing.  Plan: Cefepime 2 gm IV Q8H ordered to start on 7/6 @ 0230.  Vancomycin 2 gm IV X 1 given on 7/6 @ 0150. Vancomycin 1250 mg IV Q12H ordered to start on 7/6  @ 1400.  AUC = 501.1 Vanc trough = 12.3   Height: 5\' 8"  (172.7 cm) Weight: 96 kg (211 lb 10.3 oz) IBW/kg (Calculated) : 68.4  Temp (24hrs), Avg:100.1 F (37.8 C), Min:98.3 F (36.8 C), Max:102 F (38.9 C)  Recent Labs  Lab 03/10/23 2058 03/10/23 2323  WBC 7.4  --   CREATININE 0.58*  --   LATICACIDVEN  --  1.9    Estimated Creatinine Clearance: 139.2 mL/min (A) (by C-G formula based on SCr of 0.58 mg/dL (L)).    No Known Allergies  Antimicrobials this admission:   >>    >>   Dose adjustments this admission:   Microbiology results:  BCx:   UCx:    Sputum:    MRSA PCR:   Thank you for allowing pharmacy to be a part of this patient's care.  Effie Janoski D 03/11/2023 2:29 AM

## 2023-03-11 NOTE — H&P (Signed)
History and Physical    Patient: Derrick Blanchard DOB: [birth year] DOA: 03/10/2023 DOS: the patient was seen and examined on 03/11/2023 PCP: Patient, No Pcp Per  Patient coming from: Home  Chief Complaint:  Chief Complaint  Patient presents with   Emesis    HPI: Derrick Blanchard is a 42 y.o. male with medical history significant for Severe alcohol use disorder with history of DTs, cirrhosis with esophageal varices, , chronic HCV, hypertension, chronic lower extremity wounds with history of severe sepsis secondary to osteomyelitis 03/2022 s/p amputation of all right toes and second left toe MTP joint , who presents to the ED with a 1 day history of nausea vomiting and epigastric discomfort.  Emesis was nonbloody, non-coffee-ground.  He has a history of hemorrhoidal bleeding per record review but denies black or bloody stool.  He has had no fever or chills.  He endorses chronic swelling of the left lower extremity and a known wound. ED course and data review: Tmax 102 and tachycardic to the 1 teens with soft blood pressure of 98/70. WBC 7.8 with lactic acid 1.9.  Mild AST elevation to 71 with total bili of 2.7 (1.3 on 7//2023).  Alk phos 153.  Hemoglobin normal, platelets 52,000 (59,000 03/2022) CT chest abdomen and pelvis showed the following: IMPRESSION: 1. No acute abnormality of the chest, abdomen or pelvis. 2. Hepatic cirrhosis and sequelae of portal hypertension including splenomegaly, recanalized umbilical vein and epigastric and paraesophageal varices. CT head showed right periorbital soft tissue swelling with no acute intracranial process Left foot x-ray showed the following: IMPRESSION: 1. First digit plantar soft tissue defect with 2 cm foci of gas. 2. No radiographic findings to suggest osteomyelitis. Limited evaluation due to overlapping osseous structures and overlying soft tissues. If high clinical concern, please consider MRI for further evaluation (with  intravenous contrast if GFR greater than 30). 3.  No acute displaced fracture or dislocation.  Patient started on sepsis fluids, vancomycin and cefepime.  Hospitalist consulted for admission.  Review of Systems: As mentioned in the history of present illness. All other systems reviewed and are negative.  Past Medical History:  Diagnosis Date   Anemia    Anxiety    Cirrhosis (HCC)    Depression    Heart murmur    Hepatitis    Hypertension    Substance abuse (HCC)    Past Surgical History:  Procedure Laterality Date   COLONOSCOPY     ESOPHAGOGASTRODUODENOSCOPY (EGD) WITH PROPOFOL N/A 02/03/2015   Procedure: ESOPHAGOGASTRODUODENOSCOPY (EGD) WITH PROPOFOL;  Surgeon: Elnita Maxwell, MD;  Location: [facility] ENDOSCOPY;  Service: Endoscopy;  Laterality: N/A;   ESOPHAGOGASTRODUODENOSCOPY (EGD) WITH PROPOFOL N/A 12/21/2017   Procedure: ESOPHAGOGASTRODUODENOSCOPY (EGD) WITH PROPOFOL;  Surgeon: Wyline Mood, MD;  Location: [facility] ENDOSCOPY;  Service: Gastroenterology;  Laterality: N/A;   FISSURECTOMY     Social History:  reports that he has been smoking cigarettes. He has been smoking an average of .25 packs per day. He has never used smokeless tobacco. He reports that he does not currently use alcohol. He reports current drug use. Drug: Marijuana.  No Known Allergies  Family History  Problem Relation Age of Onset   Lung cancer Mother    Cirrhosis Father    Lung cancer Paternal Grandfather     Prior to Admission medications   Medication Sig Start Date End Date Taking? Authorizing Provider  citalopram (CELEXA) 20 MG tablet Take 1 tablet (20 mg total) by mouth daily. 11/24/17   Jamesetta Orleans,  Elnita Maxwell, NP  doxycycline (VIBRA-TABS) 100 MG tablet Take 1 tablet (100 mg total) by mouth 2 (two) times daily. 11/07/18   Jene Every, MD  lidocaine (XYLOCAINE) 2 % solution Use as directed 5 mLs in the mouth or throat every 6 (six) hours as needed for mouth pain. 07/26/18   Joni Reining, PA-C  LORazepam  (ATIVAN) 0.5 MG tablet Take 1 tablet (0.5 mg total) by mouth daily as needed. for anxiety 01/25/18   Gabriel Cirri, NP  magnesium oxide (MAG-OX) 400 (241.3 Mg) MG tablet Take 1 tablet (400 mg total) by mouth daily. 10/17/17   Milagros Loll, MD  naltrexone (DEPADE) 50 MG tablet Take 0.5 tablets (25 mg total) by mouth daily. Patient not taking: Reported on 12/21/2017 10/17/17   Milagros Loll, MD  naproxen (NAPROSYN) 500 MG tablet Take 1 tablet (500 mg total) by mouth 2 (two) times daily with a meal. 11/07/18   Jene Every, MD  omeprazole (PRILOSEC) 40 MG capsule Take 1 capsule (40 mg total) by mouth daily. 12/13/17   Wyline Mood, MD  oxyCODONE (OXY IR/ROXICODONE) 5 MG immediate release tablet  01/10/18   [provider]  pantoprazole (PROTONIX) 40 MG tablet Take 1 tablet (40 mg total) by mouth daily. Patient not taking: Reported on 11/24/2017 09/26/17   Katha Hamming, MD  potassium chloride (KLOR-CON) 20 MEQ packet Take 20 mEq by mouth daily. 10/16/17   Milagros Loll, MD  potassium chloride SA (K-DUR,KLOR-CON) 20 MEQ tablet Take 1 tablet (20 mEq total) by mouth daily. 11/25/17   Gabriel Cirri, NP  traMADol (ULTRAM) 50 MG tablet Take 1 tablet (50 mg total) by mouth every 12 (twelve) hours as needed. 07/26/18   Joni Reining, PA-C    Physical Exam: Vitals:   03/10/23 2336 03/10/23 2346 03/11/23 0030 03/11/23 0100  BP:   (!) 91/41 98/70  Pulse:   (!) 114 (!) 119  Resp:   19 17  Temp: 100 F (37.8 C) (!) 102 F (38.9 C)    TempSrc: Oral Oral    SpO2:   95% 97%  Weight:      Height:       Physical Exam Vitals and nursing note reviewed.  Constitutional:      General: He is not in acute distress. HENT:     Head: Normocephalic and atraumatic.  Cardiovascular:     Rate and Rhythm: Regular rhythm. Tachycardia present.     Heart sounds: Normal heart sounds.  Pulmonary:     Effort: Pulmonary effort is normal.     Breath sounds: Normal breath sounds.  Abdominal:     Palpations:  Abdomen is soft.     Tenderness: There is no abdominal tenderness.  Musculoskeletal:     Comments: Necrotic ulcer with foul-smelling drainage plantar aspect left foot (see picture below)  Transmetatarsal amputation right foot  Neurological:     Mental Status: Mental status is at baseline.     Labs on Admission: I have personally reviewed following labs and imaging studies  CBC: Recent Labs  Lab 03/10/23 2058  WBC 7.4  HGB 13.3  HCT 40.8  MCV 90.9  PLT 52*   Basic Metabolic Panel: Recent Labs  Lab 03/10/23 2058  NA 144  K 3.5  CL 114*  CO2 20*  GLUCOSE 112*  BUN 7  CREATININE 0.58*  CALCIUM 8.2*   GFR: Estimated Creatinine Clearance: 139.2 mL/min (A) (by C-G formula based on SCr of 0.58 mg/dL (L)). Liver Function Tests: Recent Labs  Lab 03/10/23 2058  AST 71*  ALT 26  ALKPHOS 153*  BILITOT 2.7*  PROT 7.4  ALBUMIN 3.5   Recent Labs  Lab 03/10/23 2058  LIPASE 49   No results for input(s): "AMMONIA" in the last 168 hours. Coagulation Profile: Recent Labs  Lab 03/10/23 2323  INR 1.5*   Cardiac Enzymes: No results for input(s): "CKTOTAL", "CKMB", "CKMBINDEX", "TROPONINI" in the last 168 hours. BNP (last 3 results) No results for input(s): "PROBNP" in the last 8760 hours. HbA1C: No results for input(s): "HGBA1C" in the last 72 hours. CBG: No results for input(s): "GLUCAP" in the last 168 hours. Lipid Profile: No results for input(s): "CHOL", "HDL", "LDLCALC", "TRIG", "CHOLHDL", "LDLDIRECT" in the last 72 hours. Thyroid Function Tests: No results for input(s): "TSH", "T4TOTAL", "FREET4", "T3FREE", "THYROIDAB" in the last 72 hours. Anemia Panel: No results for input(s): "VITAMINB12", "FOLATE", "FERRITIN", "TIBC", "IRON", "RETICCTPCT" in the last 72 hours. Urine analysis:    Component Value Date/Time   COLORURINE YELLOW (A) 03/11/2023 0003   APPEARANCEUR CLEAR (A) 03/11/2023 0003   APPEARANCEUR Clear 02/01/2013 1851   LABSPEC 1.006 03/11/2023  0003   LABSPEC 1.015 02/01/2013 1851   PHURINE 6.0 03/11/2023 0003   GLUCOSEU NEGATIVE 03/11/2023 0003   GLUCOSEU 50 mg/dL 16/06/9603 5409   HGBUR NEGATIVE 03/11/2023 0003   BILIRUBINUR NEGATIVE 03/11/2023 0003   BILIRUBINUR 2+ 02/01/2013 1851   KETONESUR NEGATIVE 03/11/2023 0003   PROTEINUR NEGATIVE 03/11/2023 0003   NITRITE NEGATIVE 03/11/2023 0003   LEUKOCYTESUR NEGATIVE 03/11/2023 0003   LEUKOCYTESUR Negative 02/01/2013 1851    Radiological Exams on Admission: CT CHEST ABDOMEN PELVIS W CONTRAST  Result Date: 03/11/2023 CLINICAL DATA:  Vomiting and possible sepsis EXAM: CT CHEST, ABDOMEN, AND PELVIS WITH CONTRAST TECHNIQUE: Multidetector CT imaging of the chest, abdomen and pelvis was performed following the standard protocol during bolus administration of intravenous contrast. RADIATION DOSE REDUCTION: This exam was performed according to the departmental dose-optimization program which includes automated exposure control, adjustment of the mA and/or kV according to patient size and/or use of iterative reconstruction technique. CONTRAST:  OMNIPAQUE IOHEXOL 350 MG/ML SOLN COMPARISON:  None Available. FINDINGS: CT CHEST FINDINGS Cardiovascular: Heart size is normal without pericardial effusion. The thoracic aorta is normal in course and caliber without dissection, aneurysm, ulceration or intramural hematoma. Mediastinum/Nodes: No mediastinal hematoma. No mediastinal, hilar or axillary lymphadenopathy. The visualized thyroid and thoracic esophageal course are unremarkable. Lungs/Pleura: No pulmonary contusion, pneumothorax or pleural effusion. The central airways are clear. Musculoskeletal: No acute fracture of the ribs, sternum or the visible portions of clavicles and scapulae. CT ABDOMEN PELVIS FINDINGS Hepatobiliary: Nodular liver contour with heterogeneous texture. No focal lesion. Normal gallbladder. There is a recanalized umbilical vein. Pancreas: Normal contours without ductal  dilatation. No peripancreatic fluid collection. Spleen: Splenomegaly, 21.4 cm craniocaudal. Adrenals/Urinary Tract: --Adrenal glands: No adrenal hemorrhage. --Right kidney/ureter: No hydronephrosis or perinephric hematoma. --Left kidney/ureter: No hydronephrosis or perinephric hematoma. --Urinary bladder: Unremarkable. Stomach/Bowel: --Stomach/Duodenum: No hiatal hernia or other gastric abnormality. Normal duodenal course and caliber. There are epigastric and paraesophageal varices. --Small bowel: No dilatation or inflammation. --Colon: No focal abnormality. --Appendix: Normal. Vascular/Lymphatic: Atherosclerotic calcification is present within the non-aneurysmal abdominal aorta, without hemodynamically significant stenosis. The main portal vein, splenic vein and superior mesenteric vein are patent. There are intermediate caliber varices within the anterior abdominal fat. No abdominal or pelvic lymphadenopathy. Reproductive: Normal prostate and seminal vesicles. Musculoskeletal. No pelvic fractures. Other: None. IMPRESSION: 1. No acute abnormality of the chest, abdomen or  pelvis. 2. Hepatic cirrhosis and sequelae of portal hypertension including splenomegaly, recanalized umbilical vein and epigastric and paraesophageal varices. Aortic Atherosclerosis (ICD10-I70.0). Electronically Signed   By: Deatra Robinson M.D.   On: 03/11/2023 00:24   CT Angio Neck W and/or Wo Contrast  Result Date: 03/11/2023 CLINICAL DATA:  Found down.  Vomiting.  Possible hanging. EXAM: CT ANGIOGRAPHY NECK TECHNIQUE: Multidetector CT imaging of the neck was performed using the standard protocol during bolus administration of intravenous contrast. Multiplanar CT image reconstructions and MIPs were obtained to evaluate the vascular anatomy. Carotid stenosis measurements (when applicable) are obtained utilizing NASCET criteria, using the distal internal carotid diameter as the denominator. RADIATION DOSE REDUCTION: This exam was performed  according to the departmental dose-optimization program which includes automated exposure control, adjustment of the mA and/or kV according to patient size and/or use of iterative reconstruction technique. CONTRAST:  OMNIPAQUE IOHEXOL 350 MG/ML SOLN COMPARISON:  None Available. FINDINGS: Aortic arch: Standard branching. Imaged portion shows no evidence of aneurysm or dissection. No significant stenosis of the major arch vessel origins. Right carotid system: No evidence of dissection, stenosis (50% or greater) or occlusion. Left carotid system: No evidence of dissection, stenosis (50% or greater) or occlusion. Vertebral arteries: Codominant. No evidence of dissection, stenosis (50% or greater) or occlusion. Skeleton: No fracture Other neck: Negative Upper chest: Clear IMPRESSION: Normal CTA of the neck. Electronically Signed   By: Deatra Robinson M.D.   On: 03/11/2023 00:14   CT HEAD WO CONTRAST ( )  Result Date: 03/11/2023 CLINICAL DATA:  Trauma EXAM: CT HEAD WITHOUT CONTRAST TECHNIQUE: Contiguous axial images were obtained from the base of the skull through the vertex without intravenous contrast. RADIATION DOSE REDUCTION: This exam was performed according to the departmental dose-optimization program which includes automated exposure control, adjustment of the mA and/or kV according to patient size and/or use of iterative reconstruction technique. COMPARISON:  Head CT 08/31/2018 FINDINGS: Brain: No evidence of acute infarction, hemorrhage, hydrocephalus, extra-axial collection or mass lesion/mass effect. Mild diffuse atrophy is again seen Vascular: No hyperdense vessel or unexpected calcification. Skull: Normal. Negative for fracture or focal lesion. Sinuses/Orbits: No acute finding. Other: There is right periorbital soft tissue swelling. IMPRESSION: 1. Right periorbital soft tissue swelling. 2. No acute intracranial process. Electronically Signed   By: Darliss Cheney M.D.   On: 03/11/2023 00:12   DG  Chest Portable 1 View  Result Date: 03/10/2023 CLINICAL DATA:  Evaluate infiltrate EXAM: PORTABLE CHEST 1 VIEW COMPARISON:  10/15/2017 FINDINGS: The heart size and mediastinal contours are within normal limits. Both lungs are clear. The visualized skeletal structures are unremarkable. IMPRESSION: No active disease. Electronically Signed   By: Alcide Clever M.D.   On: 03/10/2023 21:37     Data Reviewed: Relevant notes from primary care and specialist visits, past discharge summaries as available in EHR, including Care Everywhere. Prior diagnostic testing as pertinent to current admission diagnoses Updated medications and problem lists for reconciliation ED course, including vitals, labs, imaging, treatment and response to treatment Triage notes, nursing and pharmacy notes and ED provider's notes Notable results as noted in HPI   Assessment and Plan: Cellulitis of left foot Severe sepsis Infected ulcer left foot with fat layer exposed History of osteomyelitis s/p amputation digits right and left feet Sepsis criteria include fever, tachycardia, hypotension source of infection Sepsis fluids Wound culture ordered Cefepime and vancomycin Podiatry consult and wound care consult  Nausea and vomiting Suspect secondary to severe sepsis CT chest abdomen and pelvis  nonacute N.p.o. except ice chips IV antiemetics, IV hydration, IV Protonix  Alcoholic cirrhosis of liver (HCC) Portal hypertension with varices Chronic thrombocytopenia Chronic hepatitis C AST 71, bili 2.7, platelets 52,000 stable over past year Hold off on VTE chemoprophylaxis due to thrombocytopenia Will get PT and INR No acute complications suspected at this time Patient does not appear to have regular follow-up  Alcohol use disorder, severe, dependence (HCC) CIWA withdrawal protocol Folate thiamine and MVI    DVT prophylaxis: SCD unaffected side  Consults: Podiatry, TFA  Advance Care Planning:   Code Status: Prior    Family Communication: none  Disposition Plan: Back to previous home environment  Severity of Illness: The appropriate patient status for this patient is INPATIENT. Inpatient status is judged to be reasonable and necessary in order to provide the required intensity of service to ensure the patient's safety. The patient's presenting symptoms, physical exam findings, and initial radiographic and laboratory data in the context of their chronic comorbidities is felt to place them at high risk for further clinical deterioration. Furthermore, it is not anticipated that the patient will be medically stable for discharge from the hospital within 2 midnights of admission.   * I certify that at the point of admission it is my clinical judgment that the patient will require inpatient hospital care spanning beyond 2 midnights from the point of admission due to high intensity of service, high risk for further deterioration and high frequency of surveillance required.*  Author: Andris Baumann, MD 03/11/2023 1:40 AM  For on call review [email or web address].

## 2023-03-11 NOTE — Consult Note (Signed)
Subjective:  Patient ID: Derrick Blanchard, male    DOB: [birth year],  MRN: [id number]  Patient with past medical history of severe alcohol abuse, cirrhosis with esophageal varices, chronic HCV, HTN and history of bilateral foot wounds and amputations seen at beside today for concern of infection of left foot wound. Relates he has had the wound for about a year. States he has been in care off and on and noticed it was bleeding more the other day. He presented to the ED with nausea and committing and epigastric discomfort. Denies any n/v/f/c currently.   Past Medical History:  Diagnosis Date   Anemia    Anxiety    Cirrhosis (HCC)    Depression    Heart murmur    Hepatitis    Hypertension    Substance abuse (HCC)      Past Surgical History:  Procedure Laterality Date   COLONOSCOPY     ESOPHAGOGASTRODUODENOSCOPY (EGD) WITH PROPOFOL N/A 02/03/2015   Procedure: ESOPHAGOGASTRODUODENOSCOPY (EGD) WITH PROPOFOL;  Surgeon: Elnita Maxwell, MD;  Location[facility] ENDOSCOPY;  Service: Endoscopy;  Laterality: N/A;   ESOPHAGOGASTRODUODENOSCOPY (EGD) WITH PROPOFOL N/A 12/21/2017   Procedure: ESOPHAGOGASTRODUODENOSCOPY (EGD) WITH PROPOFOL;  Surgeon: Wyline Mood, MD;  Location: [facility] ENDOSCOPY;  Service: Gastroenterology;  Laterality: N/A;   FISSURECTOMY         Latest Ref Rng & Units 03/10/2023    8:58 PM 11/07/2018    1:53 AM 11/05/2018   10:45 AM  CBC  WBC 4.0 - 10.5 K/uL 7.4  5.6  5.8   Hemoglobin 13.0 - 17.0 g/dL 44.0  34.7  42.5   Hematocrit 39.0 - 52.0 % 40.8  38.7  36.8   Platelets 150 - 400 K/uL 52  95  62        Latest Ref Rng & Units 03/10/2023    8:58 PM 11/07/2018    1:53 AM 11/05/2018   10:45 AM  BMP  Glucose 70 - 99 mg/dL 956  387  564   BUN 6 - 20 mg/dL 7  5  6    Creatinine 0.61 - 1.24 mg/dL 3.32  9.51  8.84   Sodium 135 - 145 mmol/L 144  142  138   Potassium 3.5 - 5.1 mmol/L 3.5  4.0  3.1   Chloride 98 - 111 mmol/L 114  112  108   CO2 22 - 32 mmol/L 20  23  21    Calcium 8.9 -  10.3 mg/dL 8.2  8.1  7.8      Objective:   Vitals:   03/11/23 0745 03/11/23 0853  BP: 109/70 120/79  Pulse: 67 75  Resp: 15   Temp: 97.9 F (36.6 C)   SpO2: 97% 100%    General:AA&O x 3. Normal mood and affect   Vascular: DP and PT pulses 2/4 bilateral. Brisk capillary refill to all digits. Pedal hair present   Neruological. Epicritic sensation grossly intact.   Derm: Left foot plantar first metatarsal wound with granular base measuring 2 cm x 2.5 cm x 0.5 cm. Mild erythema and edema surrounding wound. No purulence. No probe to bone. Interspaces clears of maceration. 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   MSK: MMT 5/5 in dorsiflexion, plantar flexion, inversion and eversion. Normal joint ROM without pain or crepitus.     Procedure: Excisional Debridement of Wound Rationale: Removal of non-viable soft tissue from the wound to promote healing.  Anesthesia: none Pre-Debridement Wound Measurements: Overlying slough  and hyperkeratosis  Post-Debridement Wound Measurements: 2 cm x 2.5 cm x 0.5 cm  Type of Debridement: Sharp Excisional Tissue Removed: Non-viable soft tissue Depth of Debridement: subcutaneous tissue. Technique: Sharp excisional debridement to bleeding, viable wound base.  Dressing: Dry, sterile, compression dressing. Disposition: Patient tolerated procedure well.   X-ray left foot.  IMPRESSION: 1. First digit plantar soft tissue defect with 2 cm foci of gas. 2. No radiographic findings to suggest osteomyelitis. Limited evaluation due to overlapping osseous structures and overlying soft tissues. If high clinical concern, please consider MRI for further evaluation (with intravenous contrast if GFR greater than 30). 3.  No acute displaced fracture or dislocation.    Assessment & Plan:  Patient was evaluated and treated and all questions answered.  DX: Left foot wound infection Wound care: betadine,  DSD  Antibiotics: Continue IV antibiotics cefepime and vancomycin DME: Heel weight bearing in surgical shoe  Discussed with patient diagnosis and treatment options.  Imaging reviewed. X-ray images with no osseous erosions noted. Gas noted favored to be ulceration. Upon exam not major concern for gas gangrene type infection.  Do recommend getting MRI given history to rule out osteomyelitis  Discussed at this time no surgical intervention planned. Will follow-up on MRI and if there is concern for deep infection or abscess will plan for possible OR tomorrow.   Patient in agreement with plan and all questions answered.  Podiatry will continue to follow.   Louann Sjogren, DPM  Accessible via secure chat for questions or concerns.

## 2023-03-12 DIAGNOSIS — L03116 Cellulitis of left lower limb: Secondary | ICD-10-CM | POA: Diagnosis not present

## 2023-03-12 DIAGNOSIS — A419 Sepsis, unspecified organism: Secondary | ICD-10-CM | POA: Diagnosis not present

## 2023-03-12 DIAGNOSIS — L97522 Non-pressure chronic ulcer of other part of left foot with fat layer exposed: Secondary | ICD-10-CM

## 2023-03-12 DIAGNOSIS — K766 Portal hypertension: Secondary | ICD-10-CM

## 2023-03-12 DIAGNOSIS — R112 Nausea with vomiting, unspecified: Secondary | ICD-10-CM

## 2023-03-12 DIAGNOSIS — R197 Diarrhea, unspecified: Secondary | ICD-10-CM

## 2023-03-12 DIAGNOSIS — I8500 Esophageal varices without bleeding: Secondary | ICD-10-CM

## 2023-03-12 LAB — AEROBIC CULTURE W GRAM STAIN (SUPERFICIAL SPECIMEN)

## 2023-03-12 LAB — CULTURE, BLOOD (ROUTINE X 2)

## 2023-03-12 NOTE — Progress Notes (Addendum)
Patient bed alarm started. [name] went to the patient room. Patient standing at the side of the bed attempting to urinate into the urinal. He has missed,urinating on the floor. Patient has output of over 600 ml at each urination. The patient slipped on the urine going down onto the right knee. This event was witnessed by [employer]. Nurse Practioner notified.  Patient denies pain at the right knee. Helped up by Staff. Bed changed, floor cleaned. Mat placed, Male external placed as this is the second time this shift that he has missed the urinal completely. Bed alarm continues.   Called [facility] and [place]. Left messages on both of their answering machines.

## 2023-03-12 NOTE — Progress Notes (Signed)
03/12/2023 2am vancomycin dose given. Not documented fully in [facility].

## 2023-03-12 NOTE — Progress Notes (Signed)
Patient has refused dressing change at this time. Wants done after breakfast.

## 2023-03-12 NOTE — Progress Notes (Signed)
Subjective:  Patient ID: Derrick Blanchard, male    DOB: 09/03/1981,  MRN: [id number]  Patient seen at bedside this afternoon. Relates doing better and pain improved in his foot. Did have an issue with urination earlier this morning and dressing wet with urine when examined today.   Past Medical History:  Diagnosis Date   Anemia    Anxiety    Cirrhosis (HCC)    Depression    Heart murmur    Hepatitis    Hypertension    Substance abuse (HCC)      Past Surgical History:  Procedure Laterality Date   COLONOSCOPY     ESOPHAGOGASTRODUODENOSCOPY (EGD) WITH PROPOFOL N/A 02/03/2015   Procedure: ESOPHAGOGASTRODUODENOSCOPY (EGD) WITH PROPOFOL;  Surgeon: Elnita Maxwell, MD;  Location: [facility] ENDOSCOPY;  Service: Endoscopy;  Laterality: N/A;   ESOPHAGOGASTRODUODENOSCOPY (EGD) WITH PROPOFOL N/A 12/21/2017   Procedure: ESOPHAGOGASTRODUODENOSCOPY (EGD) WITH PROPOFOL;  Surgeon: Wyline Mood, MD;  Location: [facility] ENDOSCOPY;  Service: Gastroenterology;  Laterality: N/A;   FISSURECTOMY         Latest Ref Rng & Units 03/10/2023    8:58 PM 11/07/2018    1:53 AM 11/05/2018   10:45 AM  CBC  WBC 4.0 - 10.5 K/uL 7.4  5.6  5.8   Hemoglobin 13.0 - 17.0 g/dL 40.9  81.1  91.4   Hematocrit 39.0 - 52.0 % 40.8  38.7  36.8   Platelets 150 - 400 K/uL 52  95  62        Latest Ref Rng & Units 03/10/2023    8:58 PM 11/07/2018    1:53 AM 11/05/2018   10:45 AM  BMP  Glucose 70 - 99 mg/dL 782  956  213   BUN 6 - 20 mg/dL 7  5  6    Creatinine 0.61 - 1.24 mg/dL 0.86  5.78  4.69   Sodium 135 - 145 mmol/L 144  142  138   Potassium 3.5 - 5.1 mmol/L 3.5  4.0  3.1   Chloride 98 - 111 mmol/L 114  112  108   CO2 22 - 32 mmol/L 20  23  21    Calcium 8.9 - 10.3 mg/dL 8.2  8.1  7.8      Objective:   Vitals:   03/12/23 0741 03/12/23 1159  BP: 101/60 120/66  Pulse: 65 79  Resp: 20 18  Temp: 98.9 F (37.2 C) 98.3 F (36.8 C)  SpO2: 97% 98%     General:AA&O x 3. Normal mood and affect    Vascular: DP and PT  pulses 2/4 bilateral. Brisk capillary refill to all digits. Pedal hair present    Neruological. Epicritic sensation grossly intact.    Derm: Left foot plantar first metatarsal wound with granular base measuring 2 cm x 2.5 cm x 0.5 cm. Mild erythema and edema surrounding wound. No purulence. No probe to bone. Interspaces clears of maceration. 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    MSK: MMT 5/5 in dorsiflexion, plantar flexion, inversion and eversion. Normal joint ROM without pain or crepitus.   MRI Left foot  IMPRESSION: 1. Plantar soft tissue wound or ulceration underlying the first MTP joint level. Marked soft tissue swelling with ill-defined fluid in the plantar soft tissues suggesting cellulitis and developing phlegmon. No organized or drainable fluid collection at this time. 2. Mild bone marrow edema within the fibular hallux sesamoid. Early acute osteomyelitis is not excluded. This could also represent sesamoiditis or mild  bone contusion. 3. Trace first MTP joint effusion, nonspecific and may be reactive. Septic arthritis not excluded. 4. Prior second toe amputation.   Assessment & Plan:  Patient was evaluated and treated and all questions answered.  DX: Left foot wound infection Wound care: betadine, DSD  Antibiotics: Continue IV antibiotics cefepime and vancomycin DME: Heel weight bearing in surgical shoe  Discussed with patient diagnosis and treatment options.  Imaging reviewed. MRI reviewed and no major concerns for osteomyelitis or abscess.  Discussed at this time no surgical intervention planned. Will continue antibiotics and wound care Patient in agreement with plan and all questions answered.  Podiatry will follow-up with him upon discharge for wound care.  Podiatry to sign off at this time.     Louann Sjogren, DPM  Accessible via secure chat for questions or concerns.

## 2023-03-12 NOTE — Progress Notes (Signed)
Progress Note   Patient: Derrick Blanchard ZOX:[id number] DOB: 29-Nov-1980 DOA: 03/10/2023     1 DOS: the patient was seen and examined on 03/12/2023   Brief hospital course: 42 y.o. male with medical history significant for Severe alcohol use disorder with history of DTs, cirrhosis with esophageal varices, , chronic HCV, hypertension, chronic lower extremity wounds with history of severe sepsis secondary to osteomyelitis 03/2022 s/p amputation of all right toes and second left toe MTP joint , who presents to the ED with a 1 day history of nausea vomiting and epigastric discomfort.  Emesis was nonbloody, non-coffee-ground.  He has a history of hemorrhoidal bleeding per record review but denies black or bloody stool.  He has had no fever or chills.  He endorses chronic swelling of the left lower extremity and a known wound.   7/6.  Patient lactic acid is elevated on IV fluids.  On antibiotics.  MRI did not show any osteomyelitis or drainable fluid collection.  Patient had an episode of vomiting after lunch. 7/7.  No further vomiting.  Advance diet.  Continue antibiotics for draining foot ulcer.  Assessment and Plan: * Severe sepsis (HCC) Present on admission and with lactic acidosis, thrombocytopenia, elevated bilirubin, fever of 102, tachycardia and tachypnea.  Continue cefepime and vancomycin until wound cultures finalize.  Cellulitis of left lower extremity Infected ulcer left foot with fat layer exposed History of osteomyelitis s/p amputation digits right and left feet MRI did not show any drainable abscess or osteomyelitis.  Podiatry signed off and will follow-up as outpatient for wound care.  Nausea and vomiting Patient last episode of vomiting was yesterday.  Advance diet.  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  AST 71, bili 2.7, platelets 52,000. Patient must stop drinking in  order to be a candidate for transplant.  Alcohol use disorder, severe, dependence (HCC) CIWA withdrawal protocol Folate thiamine and MVI  Diarrhea Stool studies ordered.        Subjective: Patient does have some pain in his right upper abdomen.  Pain in his foot.  No shortness of breath.  Admitted with draining foot ulcer and sepsis.  Physical Exam: Vitals:   03/12/23 0457 03/12/23 0627 03/12/23 0741 03/12/23 1159  BP: (!) 98/56 115/81 101/60 120/66  Pulse: 74 76 65 79  Resp: 20 20 20 18   Temp: 99 F (37.2 C) 98.3 F (36.8 C) 98.9 F (37.2 C) 98.3 F (36.8 C)  TempSrc: Oral     SpO2: 96% 98% 97% 98%  Weight:      Height:       Physical Exam HENT:     Head: Normocephalic.     Mouth/Throat:     Pharynx: No oropharyngeal exudate.  Eyes:     General: Lids are normal.     Conjunctiva/sclera: Conjunctivae normal.  Cardiovascular:     Rate and Rhythm: Normal rate and regular rhythm.     Heart sounds: Normal heart sounds, S1 normal and S2 normal.  Pulmonary:     Breath sounds: No decreased breath sounds, wheezing, rhonchi or rales.  Abdominal:     Palpations: Abdomen is soft.     Tenderness: There is no abdominal tenderness.  Musculoskeletal:     Right lower leg: Swelling present.     Left lower leg: Swelling present.  Skin:    General: Skin is warm.     Comments: Chronic lower extremity skin discoloration.  Erythema in  the left groin.  Left foot covered.  Neurological:     Mental Status: He is alert and oriented to person, place, and time.     Data Reviewed: Wound culture abundant gram-negative rods, abundant gram-positive cocci, moderate gram-positive rods  Disposition: Status is: Inpatient Remains inpatient appropriate because: Continue IV antibiotics  Planned Discharge Destination: Home    Time spent: 27 minutes  Author: Alford Highland, MD 03/12/2023 2:05 PM  For on call review [email or web address].

## 2023-03-12 NOTE — Assessment & Plan Note (Signed)
Stool studies ordered

## 2023-03-13 ENCOUNTER — Inpatient Hospital Stay: Payer: 59

## 2023-03-13 DIAGNOSIS — A419 Sepsis, unspecified organism: Secondary | ICD-10-CM | POA: Diagnosis not present

## 2023-03-13 DIAGNOSIS — E876 Hypokalemia: Secondary | ICD-10-CM | POA: Diagnosis not present

## 2023-03-13 DIAGNOSIS — D61818 Other pancytopenia: Secondary | ICD-10-CM

## 2023-03-13 DIAGNOSIS — L03116 Cellulitis of left lower limb: Secondary | ICD-10-CM | POA: Diagnosis not present

## 2023-03-13 LAB — CBC
HCT: 31.2 % — ABNORMAL LOW (ref 39.0–52.0)
Hemoglobin: 10.4 g/dL — ABNORMAL LOW (ref 13.0–17.0)
MCH: 29.8 pg (ref 26.0–34.0)
MCHC: 33.3 g/dL (ref 30.0–36.0)
MCV: 89.4 fL (ref 80.0–100.0)
Platelets: 29 10*3/uL — CL (ref 150–400)
RBC: 3.49 MIL/uL — ABNORMAL LOW (ref 4.22–5.81)
RDW: 15.7 % — ABNORMAL HIGH (ref 11.5–15.5)
WBC: 1.9 10*3/uL — ABNORMAL LOW (ref 4.0–10.5)
nRBC: 0 % (ref 0.0–0.2)

## 2023-03-13 LAB — COMPREHENSIVE METABOLIC PANEL
ALT: 20 U/L (ref 0–44)
AST: 47 U/L — ABNORMAL HIGH (ref 15–41)
Albumin: 2.4 g/dL — ABNORMAL LOW (ref 3.5–5.0)
Alkaline Phosphatase: 105 U/L (ref 38–126)
Anion gap: 5 (ref 5–15)
BUN: 8 mg/dL (ref 6–20)
CO2: 21 mmol/L — ABNORMAL LOW (ref 22–32)
Calcium: 7.8 mg/dL — ABNORMAL LOW (ref 8.9–10.3)
Chloride: 111 mmol/L (ref 98–111)
Creatinine, Ser: 0.59 mg/dL — ABNORMAL LOW (ref 0.61–1.24)
GFR, Estimated: >60 mL/min (ref >60)
Glucose, Bld: 144 mg/dL — ABNORMAL HIGH (ref 70–99)
Potassium: 2.9 mmol/L — ABNORMAL LOW (ref 3.5–5.1)
Sodium: 137 mmol/L (ref 135–145)
Total Bilirubin: 2.4 mg/dL — ABNORMAL HIGH (ref 0.3–1.2)
Total Protein: 5.8 g/dL — ABNORMAL LOW (ref 6.5–8.1)

## 2023-03-13 LAB — CULTURE, BLOOD (ROUTINE X 2): Culture: NO GROWTH

## 2023-03-13 LAB — MAGNESIUM: Magnesium: 1.5 mg/dL — ABNORMAL LOW (ref 1.7–2.4)

## 2023-03-13 LAB — LACTIC ACID, PLASMA: Lactic Acid, Venous: 1.5 mmol/L (ref 0.5–1.9)

## 2023-03-13 LAB — AEROBIC CULTURE W GRAM STAIN (SUPERFICIAL SPECIMEN)

## 2023-03-13 MED ORDER — SODIUM CHLORIDE 0.9 % IV SOLN
2.0000 g | INTRAVENOUS | Status: DC
  Administered 2023-03-13 – 2023-03-15 (×3): 2 g via INTRAVENOUS
  Filled 2023-03-13 (×4): qty 20

## 2023-03-13 MED ORDER — POTASSIUM CHLORIDE CRYS ER 20 MEQ PO TBCR
40.0000 meq | EXTENDED_RELEASE_TABLET | Freq: Two times a day (BID) | ORAL | Status: DC
  Administered 2023-03-13 – 2023-03-15 (×5): 40 meq via ORAL
  Filled 2023-03-13 (×5): qty 2

## 2023-03-13 MED ORDER — MAGNESIUM SULFATE 4 GM/100ML IV SOLN
4.0000 g | Freq: Once | INTRAVENOUS | Status: AC
  Administered 2023-03-13: 4 g via INTRAVENOUS
  Filled 2023-03-13: qty 100

## 2023-03-13 NOTE — Plan of Care (Signed)

## 2023-03-13 NOTE — Progress Notes (Signed)
PT Cancellation Note  Patient Details Name: [name] MRN: [id number] DOB: [birth year]   Cancelled Treatment:    Reason Eval/Treat Not Completed: Patient declined, no reason specified. Patient sleeping upon SPT entering, disorientated upon awaking. Requested to continue sleeping because he was "knocked out". Will re attempt at later time.  Malachi Carl, SPT   Malachi Carl 03/13/2023, 1:29 PM

## 2023-03-13 NOTE — Assessment & Plan Note (Signed)
Platelet count dropped down to 29,000.  Recheck tomorrow.

## 2023-03-13 NOTE — Assessment & Plan Note (Signed)
Replace potassium orally today 

## 2023-03-13 NOTE — Progress Notes (Signed)
Pharmacy Antibiotic Note  Derrick Blanchard is a 42 y.o. male admitted on 03/10/2023 with cellulitis.  Pharmacy has been consulted for Vancomycin, Cefepime  dosing.  Plan: Continue cefepime 2 grams IV every 8 hours Continue vancomycin 1250 mg IV every 12 hours Obtain vancomycin trough 7/9 @ 0100 Goal is trough 10-15 mcg/ml for skin/soft tissue infection  Height: 5\' 8"  (172.7 cm) Weight: 96 kg (211 lb 10.3 oz) IBW/kg (Calculated) : 68.4  Temp (24hrs), Avg:98.1 F (36.7 C), Min:97.7 F (36.5 C), Max:98.3 F (36.8 C)  Recent Labs  Lab 03/10/23 2058 03/10/23 2323 03/11/23 1112 03/13/23 0643  WBC 7.4  --   --  1.9*  CREATININE 0.58*  --   --  0.59*  LATICACIDVEN  --  1.9 2.8* 1.5     Estimated Creatinine Clearance: 139.2 mL/min (A) (by C-G formula based on SCr of 0.59 mg/dL (L)).    No Known Allergies  Antimicrobials this admission: 7/6 cefepime >>  7/6 vancomycin  >>   Dose adjustments this admission: None  Microbiology results: 7/5 BCx: ngtd 7/6 Foot cx: pending   Thank you for allowing pharmacy to be a part of this patient's care.  Barrie Folk, PharmD 03/13/2023 9:51 AM

## 2023-03-13 NOTE — Progress Notes (Signed)
Progress Note   Patient: Derrick Blanchard WUJ:[id number] DOB: [birth year] DOA: 03/10/2023     2 DOS: the patient was seen and examined on 03/13/2023   Brief hospital course: 42 y.o. male with medical history significant for Severe alcohol use disorder with history of DTs, cirrhosis with esophageal varices, , chronic HCV, hypertension, chronic lower extremity wounds with history of severe sepsis secondary to osteomyelitis 03/2022 s/p amputation of all right toes and second left toe MTP joint , who presents to the ED with a 1 day history of nausea vomiting and epigastric discomfort.  Emesis was nonbloody, non-coffee-ground.  He has a history of hemorrhoidal bleeding per record review but denies black or bloody stool.  He has had no fever or chills.  He endorses chronic swelling of the left lower extremity and a known wound.   7/6.  Patient lactic acid is elevated on IV fluids.  On antibiotics.  MRI did not show any osteomyelitis or drainable fluid collection.  Patient had an episode of vomiting after lunch. 7/7.  No further vomiting.  Advance diet.  Continue antibiotics for draining foot ulcer. 7/8.  Patient complaining of right upper quadrant pain and foot pain.  Right upper quadrant ultrasound ordered.  Assessment and Plan: * Severe sepsis (HCC) Present on admission and with lactic acidosis, thrombocytopenia, elevated bilirubin, fever of 102, tachycardia and tachypnea.  Change antibiotics over to Rocephin with group B strep agalactiae and Proteus on wound culture.  Sensitivity still pending.  Lactic acid improved.  Cellulitis of left lower extremity Infected ulcer left foot with fat layer exposed History of osteomyelitis s/p amputation digits right and left feet MRI did not show any drainable abscess or osteomyelitis.  Podiatry signed off and will follow-up as outpatient for wound care.  Hypomagnesemia Replace 4 g IV magnesium today  Hypokalemia Replace potassium orally today.  Alcoholic  cirrhosis of liver (HCC) Portal hypertension with varices Chronic thrombocytopenia Chronic hepatitis C Elevated liver function test.  AST 71, bili 2.7, platelets 29,000. Patient must stop drinking in order to be a candidate for transplant. Will get ultrasound right upper quadrant since patient complained of some right upper quadrant discomfort and also look for ascites.  Thrombocytopenia, acquired (HCC) Platelet count dropped down to 29,000.  Recheck tomorrow.  Pancytopenia (HCC) Recheck CBC tomorrow morning.  Platelet count dropped down to 29 hemoglobin dropped to 10.4 and white blood cell count dropped down to 1.9.  Likely secondary to alcohol abuse.  Could also be seen with Maxipime.  Alcohol use disorder, severe, dependence (HCC) CIWA withdrawal protocol Folate thiamine and MVI  Nausea and vomiting No further nausea or vomiting.  Diarrhea Stool studies ordered.        Subjective: Patient with some bruising in his right upper quadrant.  Patient complains of pain in right upper quadrant.  Also pain in his foot.  Admitted with clinical sepsis.  I noticed the patient had an IV catheter in his right arm that was not connected.  Physical Exam: Vitals:   03/12/23 1443 03/12/23 2208 03/13/23 0356 03/13/23 0832  BP: 106/83 109/67 126/88 115/73  Pulse: 71 76 81 80  Resp: 17 18 19 16   Temp: 98.1 F (36.7 C) 97.7 F (36.5 C) 98.3 F (36.8 C) 97.9 F (36.6 C)  TempSrc:      SpO2: 99% 100% 99% 99%  Weight:      Height:       Physical Exam HENT:     Head: Normocephalic.  Mouth/Throat:     Pharynx: No oropharyngeal exudate.  Eyes:     General: Lids are normal.     Conjunctiva/sclera: Conjunctivae normal.  Cardiovascular:     Rate and Rhythm: Normal rate and regular rhythm.     Heart sounds: Normal heart sounds, S1 normal and S2 normal.  Pulmonary:     Breath sounds: No decreased breath sounds, wheezing, rhonchi or rales.  Abdominal:     Palpations: Abdomen is soft.      Tenderness: There is no abdominal tenderness.  Musculoskeletal:     Right lower leg: Swelling present.     Left lower leg: Swelling present.  Skin:    General: Skin is warm.     Comments: Chronic lower extremity skin discoloration.  Erythema in the left groin starting to fade.  Left foot covered.  Bruising right flank and right upper quadrant.  Neurological:     Mental Status: He is alert and oriented to person, place, and time.     Data Reviewed: Platelet count 29, hemoglobin 10.4, white blood cell count 1.9, lactic acid 1.5, AST 47, magnesium 1.5, total bilirubin 2.4, potassium 2.9  Disposition: Status is: Inpatient Remains inpatient appropriate because: Will get right upper quadrant ultrasound today.  Continue IV antibiotics.  Replace magnesium and potassium.  Check CBC again tomorrow morning.  Planned Discharge Destination: Home    Time spent: 28 minutes Case discussed with nursing staff.  Author: Alford Highland, MD 03/13/2023 2:55 PM  For on call review [email or web address].

## 2023-03-13 NOTE — Progress Notes (Signed)
Initial Nutrition Assessment  DOCUMENTATION CODES:   Obesity unspecified  INTERVENTION:   -Once diet is advanced:  -Continue Ensure Enlive po BID, each supplement provides 350 kcal and 20 grams of protein -Liberalize diet to 2 gram sodium for wider variety of meal selections -MVI with minerals daily  NUTRITION DIAGNOSIS:   Increased nutrient needs related to chronic illness (cirrhosis) as evidenced by estimated needs.  GOAL:   Patient will meet greater than or equal to 90% of their needs  MONITOR:   PO intake, Supplement acceptance, Diet advancement  REASON FOR ASSESSMENT:   Malnutrition Screening Tool    ASSESSMENT:   Pt with medical history significant for Severe alcohol use disorder with history of DTs, cirrhosis with esophageal varices, , chronic HCV, hypertension, chronic lower extremity wounds with history of severe sepsis secondary to osteomyelitis 03/2022 s/p amputation of all right toes and second left toe MTP joint , who presents with a 1 day history of nausea vomiting and epigastric discomfort.  Pt admitted with severe sepsis and cellulitis of lt lower extremity.   Reviewed I/O's: -2.7 L x 24 hours and -760 ml since admission  UOP: 3.2 L x 24 hours  Pt currently NPO. Per MD notes, pt complaining of rt upper quadrant pain. RUQ ultrasound ordered.   Pt sleeping soundly at time of visit. He did not respond to voice or touch. No family present to provide additional history.   Pt previously on a heart healthy diet. No meal completion data available to assess at this time.  Reviewed wt hx; wt has been stable over the past few years.   Medications reviewed and include folic acid, potassium chloride, and thiamine.   Labs reviewed: K: 2.9, Phos: 2.2, Mg: 1.5, CBGS: 103.   NUTRITION - FOCUSED PHYSICAL EXAM:  Flowsheet Row Most Recent Value  Orbital Region No depletion  Upper Arm Region No depletion  Thoracic and Lumbar Region No depletion  Buccal Region No  depletion  Temple Region No depletion  Clavicle Bone Region No depletion  Clavicle and Acromion Bone Region No depletion  Scapular Bone Region No depletion  Dorsal Hand No depletion  Patellar Region No depletion  Anterior Thigh Region No depletion  Posterior Calf Region No depletion  Edema (RD Assessment) Mild  Hair Reviewed  Eyes Reviewed  Mouth Reviewed  Skin Reviewed  Nails Reviewed       Diet Order:   Diet Order             Diet NPO time specified Except for: Sips with Meds  Diet effective now                   EDUCATION NEEDS:   Not appropriate for education at this time  Skin:  Skin Assessment: Skin Integrity Issues: Skin Integrity Issues:: Incisions Incisions: closed lt foot  Last BM:  03/12/23  Height:   Ht Readings from Last 1 Encounters:  03/10/23 5\' 8"  (1.727 m)    Weight:   Wt Readings from Last 1 Encounters:  03/10/23 96 kg    Ideal Body Weight:  70 kg  BMI:  Body mass index is 32.18 kg/m.  Estimated Nutritional Needs:   Kcal:  1900-2100  Protein:  105-120 grams  Fluid:  > 1.9 L    Levada Schilling, RD, LDN, CDCES Registered Dietitian II Certified Diabetes Care and Education Specialist Please refer to [facility] for RD and/or RD on-call/weekend/after hours pager

## 2023-03-13 NOTE — Assessment & Plan Note (Signed)
Replaced IV the other day will replace orally today.

## 2023-03-13 NOTE — Assessment & Plan Note (Addendum)
Recheck CBC tomorrow morning.  Platelet count up to 37, white blood cell count up to 2.9, hemoglobin 10.2.

## 2023-03-14 DIAGNOSIS — L97525 Non-pressure chronic ulcer of other part of left foot with muscle involvement without evidence of necrosis: Secondary | ICD-10-CM

## 2023-03-14 DIAGNOSIS — A419 Sepsis, unspecified organism: Secondary | ICD-10-CM | POA: Diagnosis not present

## 2023-03-14 DIAGNOSIS — L03116 Cellulitis of left lower limb: Secondary | ICD-10-CM | POA: Diagnosis not present

## 2023-03-14 LAB — BASIC METABOLIC PANEL
Anion gap: 6 (ref 5–15)
BUN: 7 mg/dL (ref 6–20)
CO2: 22 mmol/L (ref 22–32)
Calcium: 8 mg/dL — ABNORMAL LOW (ref 8.9–10.3)
Chloride: 109 mmol/L (ref 98–111)
Creatinine, Ser: 0.46 mg/dL — ABNORMAL LOW (ref 0.61–1.24)
GFR, Estimated: >60 mL/min (ref >60)
Glucose, Bld: 94 mg/dL (ref 70–99)
Potassium: 3.4 mmol/L — ABNORMAL LOW (ref 3.5–5.1)
Sodium: 137 mmol/L (ref 135–145)

## 2023-03-14 LAB — CBC
HCT: 30.9 % — ABNORMAL LOW (ref 39.0–52.0)
Hemoglobin: 10.2 g/dL — ABNORMAL LOW (ref 13.0–17.0)
MCH: 29.9 pg (ref 26.0–34.0)
MCHC: 33 g/dL (ref 30.0–36.0)
MCV: 90.6 fL (ref 80.0–100.0)
Platelets: 37 10*3/uL — ABNORMAL LOW (ref 150–400)
RBC: 3.41 MIL/uL — ABNORMAL LOW (ref 4.22–5.81)
RDW: 15.9 % — ABNORMAL HIGH (ref 11.5–15.5)
WBC: 2.9 10*3/uL — ABNORMAL LOW (ref 4.0–10.5)
nRBC: 0 % (ref 0.0–0.2)

## 2023-03-14 LAB — CULTURE, BLOOD (ROUTINE X 2): Culture: NO GROWTH

## 2023-03-14 LAB — AEROBIC CULTURE W GRAM STAIN (SUPERFICIAL SPECIMEN)

## 2023-03-14 LAB — MAGNESIUM: Magnesium: 1.8 mg/dL (ref 1.7–2.4)

## 2023-03-14 MED ORDER — MAGNESIUM OXIDE -MG SUPPLEMENT 400 (240 MG) MG PO TABS
400.0000 mg | ORAL_TABLET | Freq: Every day | ORAL | Status: DC
  Administered 2023-03-14 – 2023-03-15 (×2): 400 mg via ORAL
  Filled 2023-03-14 (×2): qty 1

## 2023-03-14 MED ORDER — LORAZEPAM 0.5 MG PO TABS: 0.5000 mg | ORAL_TABLET | Freq: Three times a day (TID) | ORAL | Status: DC | PRN

## 2023-03-14 NOTE — Plan of Care (Signed)

## 2023-03-14 NOTE — Evaluation (Signed)
Physical Therapy Evaluation Patient Details Name: Derrick Blanchard MRN: [id number] DOB: 13-Oct-1980 Today's Date: 03/14/2023  History of Present Illness  42 y.o. male with medical history significant for Severe alcohol use disorder with history of DTs, cirrhosis with esophageal varices, , chronic HCV, hypertension, chronic lower extremity wounds with history of severe sepsis secondary to osteomyelitis 03/2022 s/p amputation of all right toes and second left toe MTP joint. Came to ED with nausea vomiting and epigastric discomfort.  Emesis was nonbloody, non-coffee-ground.  Admitted with sepsis.  Clinical Impression  Pt pleasant and willing to participate with PT, PT needed to stress the use of post-op shoe and walker to control LE LE WBing though forefoot.  Pt was able to do bed mobility, transfers, prolonged ambulation and multiple stair negotiation trials w/o need for physical assist. He did report some fatigue with the effort but overall felt good about being able to manage at home, will need RW at discharge.      Assistance Recommended at Discharge Intermittent Supervision/Assistance  If plan is discharge home, recommend the following:  Can travel by private vehicle  Assist for transportation;Help with stairs or ramp for entrance;Assistance with cooking/housework        Equipment Recommendations Rolling walker (2 wheels)  Recommendations for Other Services       Functional Status Assessment Patient has had a recent decline in their functional status and demonstrates the ability to make significant improvements in function in a reasonable and predictable amount of time.     Precautions / Restrictions Precautions Precautions: Fall Required Braces or Orthoses:  (post-op shoe L) Restrictions Weight Bearing Restrictions: Yes LLE Weight Bearing:  (per podiatry note 7/6: "heel WBing with surgical shoe")      Mobility  Bed Mobility Overal bed mobility: Modified Independent              General bed mobility comments: Pt able to get himself up to sitting EOB w/o assist    Transfers Overall transfer level: Modified independent Equipment used: Rolling walker (2 wheels)               General transfer comment: post-op shoe donned, cuing for appropriate WBing/weight shift and encouragement to use walker even though he was able to safely rise w/o it.    Ambulation/Gait Ambulation/Gait assistance: Supervision Gait Distance (Feet): 100 Feet Assistive device: Rolling walker (2 wheels)         General Gait Details: Pt was able to do "prolonged" bout of ambulation with walker and post-op boot.  Pt did self select  Stairs Stairs: Yes Stairs assistance: Min guard Stair Management: No rails, One rail Left, Step to pattern, Backwards, Forwards, With walker Number of Stairs: 12 General stair comments: up/down 4 steps with single L rail and cuing for appropriate step-to strategy.  then 4 steps X 2 with retro strategy using RW, pt able to self manage with only fleeting need for cuing/assist with walker on second 4 steps trial  Wheelchair Mobility     Tilt Bed    Modified Rankin (Stroke Patients Only)       Balance Overall balance assessment: Modified Independent (able to maintain balance w/o AD, however encouraged to use all the time along with post-op shoe for wound management/protection)  Pertinent Vitals/Pain Pain Assessment Pain Assessment: 0-10 Pain Score: 7  Pain Location: L plantar surface of foot and R ribs    Home Living Family/patient expects to be discharged to:: Private residence Living Arrangements: Non-relatives/Friends Available Help at Discharge: Available PRN/intermittently;Friend(s)   Home Access: Stairs to enter Entrance Stairs-Rails:  (L rail for 3 steps before landing, no rails into home) Entrance Stairs-Number of Steps: 3+2   Home Layout: One level Home Equipment:  None      Prior Function Prior Level of Function : Independent/Modified Independent             Mobility Comments: pt reports he was able to walk a few blocks to the store and back w/o issue, does not work but can be active ADLs Comments: independent     Hand Dominance        Extremity/Trunk Assessment   Upper Extremity Assessment Upper Extremity Assessment: Overall WFL for tasks assessed    Lower Extremity Assessment Lower Extremity Assessment: Generalized weakness       Communication   Communication: No difficulties  Cognition Arousal/Alertness: Awake/alert Behavior During Therapy: WFL for tasks assessed/performed Overall Cognitive Status: Within Functional Limits for tasks assessed                                          General Comments General comments (skin integrity, edema, etc.): Pt showed some impulsivity, but with education/consistent cuing did understand benefits/implications of using shoe and walker    Exercises     Assessment/Plan    PT Assessment Patient needs continued PT services  PT Problem List Decreased strength;Decreased activity tolerance;Decreased balance;Decreased mobility;Decreased knowledge of use of DME;Decreased safety awareness;Decreased knowledge of precautions;Pain       PT Treatment Interventions DME instruction;Gait training;Stair training;Functional mobility training;Therapeutic activities;Therapeutic exercise;Balance training;Neuromuscular re-education;Patient/family education    PT Goals (Current goals can be found in the Care Plan section)  Acute Rehab PT Goals Patient Stated Goal: not lose any more toes PT Goal Formulation: With patient Time For Goal Achievement: 03/27/23 Potential to Achieve Goals: Good    Frequency Min 1X/week     Co-evaluation               AM-PAC PT "6 Clicks" Mobility  Outcome Measure Help needed turning from your back to your side while in a flat bed without using  bedrails?: None Help needed moving from lying on your back to sitting on the side of a flat bed without using bedrails?: None Help needed moving to and from a bed to a chair (including a wheelchair)?: None Help needed standing up from a chair using your arms (e.g., wheelchair or bedside chair)?: None Help needed to walk in hospital room?: A Little Help needed climbing 3-5 steps with a railing? : A Little 6 Click Score: 22    End of Session Equipment Utilized During Treatment: Gait belt (post-op shoe) Activity Tolerance: Patient tolerated treatment well Patient left: with call bell/phone within reach;with chair alarm set Nurse Communication: Mobility status PT Visit Diagnosis: Muscle weakness (generalized) (M62.81);Other abnormalities of gait and mobility (R26.89);Pain Pain - Right/Left: Left Pain - part of body: Ankle and joints of foot    Time: 1914-7829 PT Time Calculation (min) (ACUTE ONLY): 27 min   Charges:   PT Evaluation $PT Eval Low Complexity: 1 Low PT Treatments $Gait Training: 8-22 mins PT General Charges $$ ACUTE PT VISIT:  1 Visit         Malachi Pro, DPT 03/14/2023, 1:16 PM

## 2023-03-14 NOTE — Plan of Care (Signed)
  Problem: Education: Goal: Knowledge of General Education information will improve Description: Including pain rating scale, medication(s)/side effects and non-pharmacologic comfort measures Outcome: Progressing   Problem: Health Behavior/Discharge Planning: Goal: Ability to manage health-related needs will improve Outcome: Progressing   Problem: Clinical Measurements: Goal: Ability to maintain clinical measurements within normal limits will improve Outcome: Progressing Goal: Diagnostic test results will improve Outcome: Progressing Goal: Respiratory complications will improve Outcome: Progressing Goal: Cardiovascular complication will be avoided Outcome: Progressing   Problem: Activity: Goal: Risk for activity intolerance will decrease Outcome: Progressing   Problem: Nutrition: Goal: Adequate nutrition will be maintained Outcome: Progressing   Problem: Coping: Goal: Level of anxiety will decrease Outcome: Progressing   Problem: Elimination: Goal: Will not experience complications related to bowel motility Outcome: Progressing Goal: Will not experience complications related to urinary retention Outcome: Progressing   Problem: Pain Managment: Goal: General experience of comfort will improve Outcome: Progressing   

## 2023-03-14 NOTE — Progress Notes (Signed)
Progress Note   Patient: Derrick Blanchard VWU:[id number] DOB: 1980/09/08 DOA: 03/10/2023     3 DOS: the patient was seen and examined on 03/14/2023   Brief hospital course: 42 y.o. male with medical history significant for Severe alcohol use disorder with history of DTs, cirrhosis with esophageal varices, , chronic HCV, hypertension, chronic lower extremity wounds with history of severe sepsis secondary to osteomyelitis 03/2022 s/p amputation of all right toes and second left toe MTP joint , who presents to the ED with a 1 day history of nausea vomiting and epigastric discomfort.  Emesis was nonbloody, non-coffee-ground.  He has a history of hemorrhoidal bleeding per record review but denies black or bloody stool.  He has had no fever or chills.  He endorses chronic swelling of the left lower extremity and a known wound.   7/6.  Patient lactic acid is elevated on IV fluids.  On antibiotics.  MRI did not show any osteomyelitis or drainable fluid collection.  Patient had an episode of vomiting after lunch. 7/7.  No further vomiting.  Advance diet.  Continue antibiotics for draining foot ulcer. 7/8.  Patient complaining of right upper quadrant pain and foot pain.  Right upper quadrant shows cirrhosis of the liver and gallstones. 7/9.  Patient still complaining of some abdominal pain and foot pain.  Assessment and Plan: * Severe sepsis (HCC) Present on admission and with lactic acidosis, thrombocytopenia, elevated bilirubin, fever of 102, tachycardia and tachypnea.  Changed antibiotics over to Rocephin with group B strep agalactiae and Proteus on wound culture.  Lactic acid improved.  Cellulitis of left lower extremity Infected ulcer left foot with fat layer exposed History of osteomyelitis s/p amputation digits right and left feet MRI did not show any drainable abscess or osteomyelitis.  Podiatry signed off and will follow-up as outpatient for wound care.  Hypomagnesemia Replaced IV the other day  will replace orally today.  Hypokalemia Replace potassium orally.  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  AST 71, bili 2.7, platelets 29,000. Patient must stop drinking in order to be a candidate for transplant. Right upper quadrant consistent with cirrhosis.  They did not comment on ascites.  Thrombocytopenia, acquired (HCC) Platelet count dropped down to 29,000 but now up to 37,000.  Pancytopenia (HCC) Recheck CBC tomorrow morning.  Platelet count up to 37, white blood cell count up to 2.9, hemoglobin 10.2.  Alcohol use disorder, severe, dependence (HCC) Change Ativan orally to as needed. Folate thiamine and MVI  Nausea and vomiting No further nausea or vomiting.  Diarrhea Stool studies ordered.        Subjective: Patient still complaining of some abdominal pain and foot pain.  Admitted with infection of the foot.  History of alcohol use.  Physical Exam: Vitals:   03/13/23 2221 03/14/23 0401 03/14/23 0749 03/14/23 1429  BP: 117/75 104/62 113/76 107/60  Pulse: 85 84 81 79  Resp: 16 16 17 17   Temp: (!) 97.5 F (36.4 C) 98.9 F (37.2 C) 98.2 F (36.8 C) 98.7 F (37.1 C)  TempSrc:      SpO2: 100% 99% 100% 98%  Weight:      Height:       Physical Exam HENT:     Head: Normocephalic.     Mouth/Throat:     Pharynx: No oropharyngeal exudate.  Eyes:     General: Lids are normal.     Conjunctiva/sclera: Conjunctivae normal.  Cardiovascular:  Rate and Rhythm: Normal rate and regular rhythm.     Heart sounds: Normal heart sounds, S1 normal and S2 normal.  Pulmonary:     Breath sounds: No decreased breath sounds, wheezing, rhonchi or rales.  Abdominal:     Palpations: Abdomen is soft.     Tenderness: There is no abdominal tenderness.  Musculoskeletal:     Right lower leg: Swelling present.     Left lower leg: Swelling present.  Skin:    General: Skin  is warm.     Comments: Chronic lower extremity skin discoloration.  Erythema in the left groin starting to fade.  Left foot covered.  Bruising right flank and right upper quadrant.  Neurological:     Mental Status: He is alert and oriented to person, place, and time.     Data Reviewed: Sodium 137, potassium 3.4, magnesium 1.8, creatinine 0.46, white blood cell count 2.9, hemoglobin 10.2, platelet count 37  Disposition: Status is: Inpatient Remains inpatient appropriate because: Will continue IV antibiotics today and reassess on a daily basis with his labs upon when to go home.  Planned Discharge Destination: Home    Time spent: 28 minutes  Author: Alford Highland, MD 03/14/2023 3:43 PM  For on call review [email or web address].

## 2023-03-14 NOTE — Discharge Instructions (Signed)
                  Intensive Outpatient Programs  [facility]    [facility] [address]     [address] [place],  [place]     [place], [place] [phone]      [phone]  [facility]   [facility]  (Inpatient and outpatient)  [phone] (Suboxone and Methadone) [address]           [phone]           [facility]    [facility] - Intensive Outpatient [address]     [address] [place], [place] [address]     [place], [place]  [phone]      [phone]  [facility] (Outpatient, Inpatient, [facility]  [facility] (Groups and [facility]) (insurance only) [phone]    [place], [place]          [phone]       [facility]    [facility] (for caregivers and family) [address]     [address] [place], [place]     [place], [place] [phone]      [phone]  Residential Treatment Programs  [facility]  [facility](2 years) Residential: 90 days)  [facility] [address]      [address] [place], [place]     [place], [place] [phone]      [phone] or [phone]  [facility]    [facility] [address]      [address] [place], [place]     [place], [place] [phone]      [phone]  [facility]   [facility] ([facility] [address]     [address] [place], [place] [address]     [place], [place] [phone]      [phone] Admissions: 8am-3pm M-F  [facility]: Residential Program (90 Days)              [facility]: [facility]  [place], [place]     [place], [place]  [phone] or [phone]    (Walk in Hours over the weekend or by referral)   [facility]: [facility]:[phone] (for crisis  response 24 hours a day) 

## 2023-03-14 NOTE — Plan of Care (Signed)
Patient A&Ox4, from home, independent in room. Pain partially controlled with medication and rest. CIWA protocol maintained.

## 2023-03-15 DIAGNOSIS — A419 Sepsis, unspecified organism: Secondary | ICD-10-CM | POA: Diagnosis not present

## 2023-03-15 DIAGNOSIS — E876 Hypokalemia: Secondary | ICD-10-CM | POA: Diagnosis not present

## 2023-03-15 DIAGNOSIS — L03116 Cellulitis of left lower limb: Secondary | ICD-10-CM | POA: Diagnosis not present

## 2023-03-15 LAB — BASIC METABOLIC PANEL
Anion gap: 5 (ref 5–15)
BUN: 8 mg/dL (ref 6–20)
CO2: 23 mmol/L (ref 22–32)
Calcium: 8.4 mg/dL — ABNORMAL LOW (ref 8.9–10.3)
Chloride: 109 mmol/L (ref 98–111)
Creatinine, Ser: 0.59 mg/dL — ABNORMAL LOW (ref 0.61–1.24)
GFR, Estimated: >60 mL/min (ref >60)
Glucose, Bld: 90 mg/dL (ref 70–99)
Potassium: 4.3 mmol/L (ref 3.5–5.1)
Sodium: 137 mmol/L (ref 135–145)

## 2023-03-15 LAB — CBC
HCT: 34.8 % — ABNORMAL LOW (ref 39.0–52.0)
Hemoglobin: 11.3 g/dL — ABNORMAL LOW (ref 13.0–17.0)
MCH: 29.9 pg (ref 26.0–34.0)
MCHC: 32.5 g/dL (ref 30.0–36.0)
MCV: 92.1 fL (ref 80.0–100.0)
Platelets: 45 10*3/uL — ABNORMAL LOW (ref 150–400)
RBC: 3.78 MIL/uL — ABNORMAL LOW (ref 4.22–5.81)
RDW: 16.2 % — ABNORMAL HIGH (ref 11.5–15.5)
WBC: 3.6 10*3/uL — ABNORMAL LOW (ref 4.0–10.5)
nRBC: 0 % (ref 0.0–0.2)

## 2023-03-15 LAB — CULTURE, BLOOD (ROUTINE X 2): Special Requests: ADEQUATE

## 2023-03-15 LAB — MAGNESIUM: Magnesium: 1.6 mg/dL — ABNORMAL LOW (ref 1.7–2.4)

## 2023-03-15 MED ORDER — MAGNESIUM OXIDE -MG SUPPLEMENT 400 (240 MG) MG PO TABS
400.0000 mg | ORAL_TABLET | Freq: Two times a day (BID) | ORAL | Status: DC
  Administered 2023-03-15 – 2023-03-16 (×2): 400 mg via ORAL
  Filled 2023-03-15 (×2): qty 1

## 2023-03-15 NOTE — Progress Notes (Addendum)
Physical Therapy Treatment Patient Details Name: Derrick Blanchard MRN: [id number] DOB: 01-Nov-1980 Today's Date: 03/15/2023   History of Present Illness 42 y.o. male with medical history significant for Severe alcohol use disorder with history of DTs, cirrhosis with esophageal varices, , chronic HCV, hypertension, chronic lower extremity wounds with history of severe sepsis secondary to osteomyelitis 03/2022 s/p amputation of all right toes and second left toe MTP joint. Came to ED with nausea vomiting and epigastric discomfort.  Emesis was nonbloody, non-coffee-ground.  Admitted with sepsis.    PT Comments  Pt was long sitting in bed with RN in room. He is A and O x 4. Agrees to session with encouragement. Reviewed importance of heel wt bearing only to allow healing. He states understanding but needs vcing throughout session to properly adhere. Pt was able to safely exit bed, stand, and ambulate with RW without LOB. He is planning to DC home. Acute PT will continue to follow during admission but do not feel pt will required HHPT or OPPT at DC.     Assistance Recommended at Discharge Set up Supervision/Assistance  If plan is discharge home, recommend the following:   Assist for transportation;Help with stairs or ramp for entrance;Assistance with cooking/housework      Equipment Recommendations  Rolling walker (2 wheels)       Precautions / Restrictions Precautions Precautions: Fall Restrictions Weight Bearing Restrictions: Yes LLE Weight Bearing: Non weight bearing (Heel wt bearing with surgical shoe)     Mobility  Bed Mobility Overal bed mobility: Modified Independent   Transfers Overall transfer level: Modified independent Equipment used: Rolling walker (2 wheels)  General transfer comment: post-op shoe donned, cuing for appropriate WBing/weight shift and encouragement to use walker even though he was able to safely rise w/o it.    Ambulation/Gait Ambulation/Gait  assistance: Supervision Gait Distance (Feet): 100 Feet Assistive device: Rolling walker (2 wheels) Gait velocity: decreased  General Gait Details: pt required constant encouragemnent to heel wt bear versus landing flat foot.   Stairs Stairs: Yes Stairs assistance: Supervision Stair Management: One rail Right, Forwards Number of Stairs: 8 General stair comments: Pt was able to ascend/descend ~ 8 stair with +1 R rail to simulate home entry. Pt was able to perform but again requires Vcs for wt bearing.    Balance Overall balance assessment: Modified Independent     Cognition Arousal/Alertness: Awake/alert Behavior During Therapy: WFL for tasks assessed/performed Overall Cognitive Status: Within Functional Limits for tasks assessed              Pertinent Vitals/Pain Pain Assessment Pain Assessment: 0-10 Pain Score: 5  Pain Location: L foot Pain Descriptors / Indicators: Discomfort, Guarding, Grimacing Pain Intervention(s): Limited activity within patient's tolerance, Monitored during session, Premedicated before session, Repositioned     PT Goals (current goals can now be found in the care plan section) Acute Rehab PT Goals Patient Stated Goal: go home Progress towards PT goals: Progressing toward goals    Frequency    Min 1X/week      PT Plan Current plan remains appropriate       AM-PAC PT "6 Clicks" Mobility   Outcome Measure  Help needed turning from your back to your side while in a flat bed without using bedrails?: None Help needed moving from lying on your back to sitting on the side of a flat bed without using bedrails?: None Help needed moving to and from a bed to a chair (including a wheelchair)?: None Help needed standing  up from a chair using your arms (e.g., wheelchair or bedside chair)?: None Help needed to walk in hospital room?: A Little Help needed climbing 3-5 steps with a railing? : A Little 6 Click Score: 22    End of Session Equipment  Utilized During Treatment: Other (comment) (post op shoe) Activity Tolerance: Patient tolerated treatment well Patient left: in bed;with call bell/phone within reach Nurse Communication: Mobility status PT Visit Diagnosis: Muscle weakness (generalized) (M62.81);Other abnormalities of gait and mobility (R26.89);Pain     Time: 1455-1511 PT Time Calculation (min) (ACUTE ONLY): 16 min  Charges:    $Gait Training: 8-22 mins PT General Charges $$ ACUTE PT VISIT: 1 Visit                     Jetta Lout PTA 03/15/23, 3:55 PM

## 2023-03-15 NOTE — Progress Notes (Signed)
Nutrition Follow-up  DOCUMENTATION CODES:   Obesity unspecified  INTERVENTION:   -Continue Ensure Enlive po BID, each supplement provides 350 kcal and 20 grams of protein -Liberalize diet to 2 gram sodium for wider variety of meal selections -Continue MVI with minerals daily  NUTRITION DIAGNOSIS:   Increased nutrient needs related to chronic illness (cirrhosis) as evidenced by estimated needs.  Ongoing  GOAL:   Patient will meet greater than or equal to 90% of their needs  Progressing   MONITOR:   PO intake, Supplement acceptance, Diet advancement  REASON FOR ASSESSMENT:   Malnutrition Screening Tool    ASSESSMENT:   Pt with medical history significant for Severe alcohol use disorder with history of DTs, cirrhosis with esophageal varices, , chronic HCV, hypertension, chronic lower extremity wounds with history of severe sepsis secondary to osteomyelitis 03/2022 s/p amputation of all right toes and second left toe MTP joint , who presents with a 1 day history of nausea vomiting and epigastric discomfort.  Reviewed I/O's: -1.4 L x 24 hours and -6.6 L since admission  UOP: 1.4 L x 24 hours   Per MD notes, MRI did not reveal any drianable abscess or osteomyelitis. Plan to follow-up with podiatry as an outpatient for wound care.   Pt currently on a heart healthy diet. Noted meal completions 100%. Pt is also drinking Ensure supplements.   No new wt since admission.   Medications reviewed and include folic acid, magnesium oxide, protonix, potassium chloride, and thiamine.   Labs reviewed: Phos: 2.2, Mg: 1.6, CBGS: 103 (inpatient orders for glycemic control are none).    Diet Order:   Diet Order             Diet Heart Fluid consistency: Thin  Diet effective now                   EDUCATION NEEDS:   Not appropriate for education at this time  Skin:  Skin Assessment: Skin Integrity Issues: Skin Integrity Issues:: Incisions Incisions: closed lt foot  Last  BM:  03/14/23  Height:   Ht Readings from Last 1 Encounters:  03/10/23 5\' 8"  (1.727 m)    Weight:   Wt Readings from Last 1 Encounters:  03/10/23 96 kg    Ideal Body Weight:  70 kg  BMI:  Body mass index is 32.18 kg/m.  Estimated Nutritional Needs:   Kcal:  1900-2100  Protein:  105-120 grams  Fluid:  > 1.9 L    Levada Schilling, RD, LDN, CDCES Registered Dietitian II Certified Diabetes Care and Education Specialist Please refer to [facility] for RD and/or RD on-call/weekend/after hours pager

## 2023-03-15 NOTE — Progress Notes (Signed)
PT Cancellation Note  Patient Details Name: Derrick Blanchard MRN: [id number] DOB: March 30, 1981   Cancelled Treatment:     PT attempt. Pt requested author return. Pt calling lunch order in currently and request to wait until after lunch to participate.    Rushie Chestnut 03/15/2023, 1:11 PM

## 2023-03-15 NOTE — Progress Notes (Signed)
Progress Note   Patient: Derrick Blanchard GNF:[id number] DOB: Jan 27, 1981 DOA: 03/10/2023     4 DOS: the patient was seen and examined on 03/15/2023   Brief hospital course: 42 y.o. male with medical history significant for Severe alcohol use disorder with history of DTs, cirrhosis with esophageal varices, , chronic HCV, hypertension, chronic lower extremity wounds with history of severe sepsis secondary to osteomyelitis 03/2022 s/p amputation of all right toes and second left toe MTP joint , who presents to the ED with a 1 day history of nausea vomiting and epigastric discomfort.  Emesis was nonbloody, non-coffee-ground.  He has a history of hemorrhoidal bleeding per record review but denies black or bloody stool.  He has had no fever or chills.  He endorses chronic swelling of the left lower extremity and a known wound.   7/6.  Patient lactic acid is elevated on IV fluids.  On antibiotics.  MRI did not show any osteomyelitis or drainable fluid collection.  Patient had an episode of vomiting after lunch. 7/7.  No further vomiting.  Advance diet.  Continue antibiotics for draining foot ulcer. 7/8.  Patient complaining of right upper quadrant pain and foot pain.  Right upper quadrant shows cirrhosis of the liver and gallstones. 7/9.  Patient still complaining of some abdominal pain and foot pain. 7/10.  Patient is sitting on the edge of bed, has pain over the left foot.  Worked with physical therapy, need rolling walker.  Assessment and Plan: * Severe sepsis (HCC) Present on admission and with lactic acidosis, thrombocytopenia, elevated bilirubin, fever of 102, tachycardia and tachypnea.  Changed antibiotics over to Rocephin with group B strep agalactiae and Proteus on wound culture.  Lactic acid improved. Will change antibiotics to Bactrim for the next 5 days and follow-up with PCP and primary care.  Cellulitis of left lower extremity Infected ulcer left foot with fat layer exposed History of  osteomyelitis s/p amputation digits right and left feet MRI did not show any drainable abscess or osteomyelitis.  Podiatry signed off and will follow-up as outpatient for wound care.  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  AST 71, bili 2.7, platelets 29,000. I advised him to stop stop drinking in order to be a potential candidate for transplant. Right upper quadrant consistent with cirrhosis.    Thrombocytopenia, acquired (HCC) Platelet count dropped improved from 29 to 45  Pancytopenia (HCC) White count, hemoglobin platelet count improved.  Alcohol use disorder, severe, dependence (HCC) Change Ativan orally to as needed. Folate thiamine and MVI daily  Nausea and vomiting, diarrhea Improved with antiemetics.        Subjective: Patient seen and examined today morning.  States that he has left foot pain.  Worked with physical therapy.  Denies any other complaints.  Eating fair.  Wishes to go home tomorrow as he does not have anybody at home and now ride.  Physical Exam: Vitals:   03/14/23 1429 03/14/23 2310 03/15/23 0756 03/15/23 1529  BP: 107/60 108/66 118/76 111/71  Pulse: 79 80 (!) 105 96  Resp: 17 16 16 16   Temp: 98.7 F (37.1 C) 98 F (36.7 C) 99.5 F (37.5 C) 98.8 F (37.1 C)  TempSrc:      SpO2: 98% 98% 99% 98%  Weight:  Height:       General -young obese male, no apparent no distress HEENT - PERRLA, EOMI, atraumatic head, non tender sinuses. Lung - Clear, rales, rhonchi, wheezes. Heart - S1, S2 heard, no murmurs, rubs, 1+ pedal edema Neuro - Alert, awake and oriented x 3, non focal exam. Skin - Warm and dry.  Left foot dressing intact, right toes amputated Data  Reviewed:  CBC, BMP  Family Communication: Patient understands above care plan, wishes to go home tomorrow  Disposition: Status is: Inpatient Remains inpatient appropriate because: IV antibiotic therapy, safe discharge plan  Planned Discharge Destination: Home    Time spent: 42 minutes  Author: Marcelino Duster, MD 03/15/2023 4:44 PM  For on call review [email or web address].

## 2023-03-15 NOTE — Plan of Care (Signed)

## 2023-03-15 NOTE — Plan of Care (Signed)
Patient A&Ox4, from home, independent in room. Pain partially controlled with medication, rest, and repositioning. Dressing changed, C/D/I.

## 2023-03-16 ENCOUNTER — Other Ambulatory Visit: Payer: Self-pay

## 2023-03-16 DIAGNOSIS — E876 Hypokalemia: Secondary | ICD-10-CM | POA: Diagnosis not present

## 2023-03-16 DIAGNOSIS — A419 Sepsis, unspecified organism: Secondary | ICD-10-CM | POA: Diagnosis not present

## 2023-03-16 DIAGNOSIS — L03116 Cellulitis of left lower limb: Secondary | ICD-10-CM | POA: Diagnosis not present

## 2023-03-16 LAB — BASIC METABOLIC PANEL
Anion gap: 7 (ref 5–15)
BUN: 8 mg/dL (ref 6–20)
CO2: 24 mmol/L (ref 22–32)
Calcium: 9.1 mg/dL (ref 8.9–10.3)
Chloride: 105 mmol/L (ref 98–111)
Creatinine, Ser: 0.53 mg/dL — ABNORMAL LOW (ref 0.61–1.24)
GFR, Estimated: >60 mL/min (ref >60)
Glucose, Bld: 118 mg/dL — ABNORMAL HIGH (ref 70–99)
Potassium: 4.2 mmol/L (ref 3.5–5.1)
Sodium: 136 mmol/L (ref 135–145)

## 2023-03-16 LAB — MAGNESIUM: Magnesium: 1.6 mg/dL — ABNORMAL LOW (ref 1.7–2.4)

## 2023-03-16 MED ORDER — FOLIC ACID 1 MG PO TABS
1.0000 mg | ORAL_TABLET | Freq: Every day | ORAL | 3 refills | Status: DC
  Filled 2023-03-16: qty 30, 30d supply, fill #0

## 2023-03-16 MED ORDER — PANTOPRAZOLE SODIUM 40 MG PO TBEC: 40.0000 mg | DELAYED_RELEASE_TABLET | Freq: Every day | ORAL | Status: DC

## 2023-03-16 MED ORDER — AMOXICILLIN-POT CLAVULANATE 500-125 MG PO TABS
1.0000 | ORAL_TABLET | Freq: Three times a day (TID) | ORAL | 0 refills | Status: AC
  Filled 2023-03-16: qty 15, 5d supply, fill #0

## 2023-03-16 MED ORDER — MAGNESIUM OXIDE -MG SUPPLEMENT 400 (240 MG) MG PO TABS
400.0000 mg | ORAL_TABLET | Freq: Two times a day (BID) | ORAL | 0 refills | Status: DC
  Filled 2023-03-16: qty 60, 30d supply, fill #0

## 2023-03-16 MED ORDER — ADULT MULTIVITAMIN W/MINERALS CH
1.0000 | ORAL_TABLET | Freq: Every day | ORAL | 0 refills | Status: DC
  Filled 2023-03-16: qty 130, 130d supply, fill #0

## 2023-03-16 MED ORDER — PANTOPRAZOLE SODIUM 40 MG PO TBEC
40.0000 mg | DELAYED_RELEASE_TABLET | Freq: Every day | ORAL | 1 refills | Status: DC
  Filled 2023-03-16: qty 30, 30d supply, fill #0

## 2023-03-16 MED ORDER — OXYCODONE HCL 5 MG PO TABS
5.0000 mg | ORAL_TABLET | Freq: Four times a day (QID) | ORAL | 0 refills | Status: DC | PRN
  Filled 2023-03-16: qty 15, 4d supply, fill #0

## 2023-03-16 MED ORDER — VITAMIN B-1 100 MG PO TABS
100.0000 mg | ORAL_TABLET | Freq: Every day | ORAL | 3 refills | Status: DC
  Filled 2023-03-16: qty 30, 30d supply, fill #0

## 2023-03-16 NOTE — TOC Progression Note (Signed)
Transition of Care [facility]) - Progression Note    Patient Details  Name: Derrick Blanchard MRN: [id number] Date of Birth: 1980/12/19  [facility]) CM/SW Contact  Garret Reddish, RN Phone Number: 03/16/2023, 1:43 PM  Clinical Narrative:   Cab voucher provided for patient for transport home today.          Expected Discharge Plan and Services         Expected Discharge Date: 03/16/23                                     Social Determinants of Health (SDOH) Interventions SDOH Screenings   Food Insecurity: Food Insecurity Present (03/11/2023)  Housing: High Risk (03/11/2023)  Transportation Needs: Patient Declined (03/11/2023)  Utilities: Patient Declined (03/11/2023)  Financial Resource Strain: High Risk (03/22/2022)   Received from [facility]  Tobacco Use: High Risk (03/10/2023)    Readmission Risk Interventions     No data to display

## 2023-03-16 NOTE — Plan of Care (Signed)

## 2023-03-16 NOTE — Progress Notes (Signed)
PHARMACIST - PHYSICIAN COMMUNICATION  DR:   Clide Dales  CONCERNING: IV to Oral Route Change Policy  RECOMMENDATION: This patient is receiving pantoprazole by the intravenous route.  Based on criteria approved by the Pharmacy and Therapeutics Committee, the intravenous medication(s) is/are being converted to the equivalent oral dose form(s).   DESCRIPTION: These criteria include: The patient is eating (either orally or via tube) and/or has been taking other orally administered medications for a least 24 hours The patient has no evidence of active gastrointestinal bleeding or impaired GI absorption (gastrectomy, short bowel, patient on TNA or NPO).  If you have questions about this conversion, please contact the Pharmacy Department  []   [phone] )  [facility] [x]   [phone] )  [facility] []   [phone] )  [facility] []   [phone] )  [facility] []   [phone] )  [facility]   Barrie Folk, [facility] 03/16/2023 7:28 AM

## 2023-03-16 NOTE — Discharge Summary (Signed)
Physician Discharge Summary   Patient: Derrick Blanchard MRN: [id number] DOB: Sep 18, 1980  Admit date:     03/10/2023  Discharge date: 03/16/23  Discharge Physician: Marcelino Duster   PCP: Patient, No Pcp Per   Recommendations at discharge:    PCP follow up in 1 week. Podiatry follow up as instructed. Wound care daily. Complete antibiotic regimen.  Discharge Diagnoses: Principal Problem:   Severe sepsis (HCC) Active Problems:   Cellulitis of left lower extremity   Hypokalemia   Ulcer of left foot with muscle involvement without evidence of necrosis (HCC)   Hypomagnesemia   Alcoholic cirrhosis of liver (HCC)   Pancytopenia (HCC)   Thrombocytopenia, acquired (HCC)   Portal hypertension with esophageal varices (HCC)   Alcohol use disorder, severe, dependence (HCC)   Chronic hepatitis C (HCC)   Nausea and vomiting   Diarrhea  Resolved Problems:   * No resolved hospital problems. *  Hospital Course: 42 y.o. male with medical history significant for Severe alcohol use disorder with history of DTs, cirrhosis with esophageal varices, , chronic HCV, hypertension, chronic lower extremity wounds with history of severe sepsis secondary to osteomyelitis 03/2022 s/p amputation of all right toes and second left toe MTP joint , who presents to the ED with a 1 day history of nausea vomiting and epigastric discomfort.  Emesis was nonbloody, non-coffee-ground.  He has a history of hemorrhoidal bleeding per record review but denies black or bloody stool.  He has had no fever or chills.  He endorses chronic swelling of the left lower extremity and a known wound.   7/6.  Patient lactic acid is elevated on IV fluids.  On antibiotics.  MRI did not show any osteomyelitis or drainable fluid collection.  Patient had an episode of vomiting after lunch. 7/7.  No further vomiting.  Advance diet.  Continue antibiotics for draining foot ulcer. 7/8.  Patient complaining of right upper quadrant pain and  foot pain.  Right upper quadrant shows cirrhosis of the liver and gallstones. 7/9.  Patient still complaining of some abdominal pain and foot pain. 7/10.  Patient is sitting on the edge of bed, has pain over the left foot.  Worked with physical therapy, need rolling walker.  7/11 - Patient stable to be discharged home with Augmentin therapy. He understands to follow his PCP, podiatry as instructed.  Assessment and Plan: * Severe sepsis (HCC) Present on admission and with lactic acidosis, thrombocytopenia, elevated bilirubin, fever of 102, tachycardia and tachypnea.  Changed antibiotics over to Rocephin with group B strep agalactiae and Proteus on wound culture.  Lactic acid improved. Antibiotics transitioned to oral Augmentin for discharge.  Cellulitis of left lower extremity Infected ulcer left foot with fat layer exposed History of osteomyelitis s/p amputation digits right and left feet MRI did not show any drainable abscess or osteomyelitis.  Podiatry signed off and will follow-up as outpatient for wound care.  Hypomagnesemia Replaced IV the other day will replace orally today. Oral mag supplement script given.  Hypokalemia Replaced potassium orally. Improved.  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  AST 71, bili 2.7, platelets 29,000. Patient must stop drinking in order to be a candidate for transplant. Right upper quadrant consistent with cirrhosis.  Encouraged him to quit drinking alcohol.  Thrombocytopenia, acquired (HCC) Platelet count dropped down to 29,000 but now up to 37,000. This has improved over hospital stay.  Pancytopenia (HCC) Platelet count up to 45,  white blood cell count up to 3.6, hemoglobin 11.3. Outpatient PCP follow up.  Alcohol use disorder, severe, dependence (HCC) Change Ativan orally to as needed. Folate thiamine and MVI. Encouraged him to quit  drinking alcohol.  Nausea and vomiting, diarrhea resolved.  Advised to be compliant with meds, PCP follow up, podiatry wound care dressing. He understands and agrees.       Consultants: Podiatry, wound care Procedures performed: none   Disposition: Home Diet recommendation:  Discharge Diet Orders (From admission, onward)     Start     Ordered   03/16/23 0000  Diet - low sodium heart healthy        03/16/23 1122           Cardiac diet DISCHARGE MEDICATION: Allergies as of 03/16/2023   No Known Allergies      Medication List     TAKE these medications    amoxicillin-clavulanate 500-125 MG tablet Commonly known as: Augmentin Take 1 tablet by mouth 3 (three) times daily for 5 days.   folic acid 1 MG tablet Commonly known as: FOLVITE Take 1 tablet (1 mg total) by mouth daily. Start taking on: March 17, 2023   magnesium oxide 400 (240 Mg) MG tablet Commonly known as: MAG-OX Take 1 tablet (400 mg total) by mouth 2 (two) times daily.   multivitamin with minerals Tabs tablet Take 1 tablet by mouth daily. Start taking on: March 17, 2023   oxyCODONE 5 MG immediate release tablet Commonly known as: Oxy IR/ROXICODONE Take 1 tablet (5 mg total) by mouth every 6 (six) hours as needed for moderate pain.   pantoprazole 40 MG tablet Commonly known as: PROTONIX Take 1 tablet (40 mg total) by mouth daily. Start taking on: March 17, 2023   thiamine 100 MG tablet Commonly known as: Vitamin B-1 Take 1 tablet (100 mg total) by mouth daily. Start taking on: March 17, 2023               Durable Medical Equipment  (From admission, onward)           Start     Ordered   03/15/23 1638  For home use only DME Walker rolling  Once       Question Answer Comment  Walker: With 5 Inch Wheels   Patient needs a walker to treat with the following condition Amputation of one or more toes (HCC)      03/15/23 1637              Discharge Care Instructions  (From  admission, onward)           Start     Ordered   03/16/23 0000  Discharge wound care:       Comments: Wound care to left plantar foot full thickness wound: Cleanse with NS, pat dry. Apply povidone-iodine swabstick and allow to air dry. Cover with dry gauze and secure with Kerlix roll gauze/paper tape. Change daily and PRN dressing dislodgement.   03/16/23 1122            Follow-up Information     Louann Sjogren, DPM Follow up in 1 week(s).   Specialty: Podiatry Contact information: [address] [place] [address] [phone]                Discharge Exam: Ceasar Mons Weights   03/10/23 2053  Weight: 96 kg   General -young obese male, no apparent no distress HEENT - PERRLA, EOMI, atraumatic head, non tender sinuses. Lung -  Clear, rales, rhonchi, wheezes. Heart - S1, S2 heard, no murmurs, rubs, 1+ pedal edema Neuro - Alert, awake and oriented x 3, non focal exam. Skin - Warm and dry.  Left foot dressing intact, right toes amputated  Condition at discharge: stable  The results of significant diagnostics from this hospitalization (including imaging, microbiology, ancillary and laboratory) are listed below for reference.   Imaging Studies: US Abdomen Limited RUQ (LIVER/GB)  Result Date: 03/13/2023 CLINICAL DATA:  Right upper quadrant pain Known cirrhosis EXAM: ULTRASOUND ABDOMEN LIMITED RIGHT UPPER QUADRANT COMPARISON:  CT chest abdomen pelvis 03/10/2023 FINDINGS: Gallbladder: Gallstones: Present Sludge: None Gallbladder Wall: Mild gallbladder wall thickening measuring up to 7 mm likely due to contracted configuration. Pericholecystic fluid: None Sonographic Murphy's Sign: Negative per technologist Common bile duct: Diameter: 4 mm Liver: Parenchymal echogenicity: Diffusely echogenic Contours: Nodular Lesions: None Portal vein: Not well visualized.  Appears to have to and fro flow. Other: Recanalized umbilical vein again seen. IMPRESSION: 1.  Cirrhotic liver  morphology without focal hepatic lesion. 2. Cholelithiasis. No definitive evidence of acute cholecystitis. Gallbladder wall thickening likely due to under distension and hepatitis. 3. Limited assessment of the portal vein with possible to and fro flow present, consistent with known portal hypertension. Electronically Signed   By: Acquanetta Belling M.D.   On: 03/13/2023 16:28   MR FOOT LEFT WO CONTRAST  Result Date: 03/11/2023 CLINICAL DATA:  Soft tissue infection suspected, foot, xray done EXAM: MRI OF THE LEFT FOOT WITHOUT CONTRAST TECHNIQUE: Multiplanar, multisequence MR imaging of the left forefoot was performed. No intravenous contrast was administered. COMPARISON:  X-ray 03/11/2023 FINDINGS: Technical Note: Despite efforts by the technologist and patient, motion artifact is present on today's exam and could not be eliminated. This reduces exam sensitivity and specificity. Bones/Joint/Cartilage Prior second toe amputation at the MTP joint level. No acute fracture. No dislocation. There is mild bone marrow edema within the fibular hallux sesamoid with preservation of the T1 marrow signal. No additional sites of focal bone marrow edema within the forefoot. Trace first MTP joint effusion. No erosion or marrow replacement. Ligaments Intact Lisfranc ligament.  Intact collateral ligaments. Muscles and Tendons Chronic denervation changes of the foot musculature. No tenosynovitis. Soft tissues Plantar soft tissue wound or ulceration underlying the first MTP joint level. Marked soft tissue swelling with ill-defined fluid in the plantar soft tissues. No organized or drainable fluid collection. Mild dorsal subcutaneous edema. IMPRESSION: 1. Plantar soft tissue wound or ulceration underlying the first MTP joint level. Marked soft tissue swelling with ill-defined fluid in the plantar soft tissues suggesting cellulitis and developing phlegmon. No organized or drainable fluid collection at this time. 2. Mild bone marrow edema  within the fibular hallux sesamoid. Early acute osteomyelitis is not excluded. This could also represent sesamoiditis or mild bone contusion. 3. Trace first MTP joint effusion, nonspecific and may be reactive. Septic arthritis not excluded. 4. Prior second toe amputation. Electronically Signed   By: Duanne Guess D.O.   On: 03/11/2023 17:51   DG Foot Complete Left  Result Date: 03/11/2023 CLINICAL DATA:  wound EXAM: LEFT FOOT - COMPLETE 3+ VIEW COMPARISON:  None Available. FINDINGS: Second digit phalangeal amputation. No cortical erosion or destruction. There is no evidence of fracture or dislocation. There is no evidence of arthropathy or other focal bone abnormality. Subcutaneus soft tissue edema of the midfoot and ankle. First digit plantar soft tissue defect with 2 cm foci of gas. IMPRESSION: 1. First digit plantar soft tissue defect with 2 cm  foci of gas. 2. No radiographic findings to suggest osteomyelitis. Limited evaluation due to overlapping osseous structures and overlying soft tissues. If high clinical concern, please consider MRI for further evaluation (with intravenous contrast if GFR greater than 30). 3.  No acute displaced fracture or dislocation. Electronically Signed   By: Tish Frederickson M.D.   On: 03/11/2023 01:53   CT CHEST ABDOMEN PELVIS W CONTRAST  Result Date: 03/11/2023 CLINICAL DATA:  Vomiting and possible sepsis EXAM: CT CHEST, ABDOMEN, AND PELVIS WITH CONTRAST TECHNIQUE: Multidetector CT imaging of the chest, abdomen and pelvis was performed following the standard protocol during bolus administration of intravenous contrast. RADIATION DOSE REDUCTION: This exam was performed according to the departmental dose-optimization program which includes automated exposure control, adjustment of the mA and/or kV according to patient size and/or use of iterative reconstruction technique. CONTRAST:  OMNIPAQUE IOHEXOL 350 MG/ML SOLN COMPARISON:  None Available. FINDINGS: CT CHEST FINDINGS  Cardiovascular: Heart size is normal without pericardial effusion. The thoracic aorta is normal in course and caliber without dissection, aneurysm, ulceration or intramural hematoma. Mediastinum/Nodes: No mediastinal hematoma. No mediastinal, hilar or axillary lymphadenopathy. The visualized thyroid and thoracic esophageal course are unremarkable. Lungs/Pleura: No pulmonary contusion, pneumothorax or pleural effusion. The central airways are clear. Musculoskeletal: No acute fracture of the ribs, sternum or the visible portions of clavicles and scapulae. CT ABDOMEN PELVIS FINDINGS Hepatobiliary: Nodular liver contour with heterogeneous texture. No focal lesion. Normal gallbladder. There is a recanalized umbilical vein. Pancreas: Normal contours without ductal dilatation. No peripancreatic fluid collection. Spleen: Splenomegaly, 21.4 cm craniocaudal. Adrenals/Urinary Tract: --Adrenal glands: No adrenal hemorrhage. --Right kidney/ureter: No hydronephrosis or perinephric hematoma. --Left kidney/ureter: No hydronephrosis or perinephric hematoma. --Urinary bladder: Unremarkable. Stomach/Bowel: --Stomach/Duodenum: No hiatal hernia or other gastric abnormality. Normal duodenal course and caliber. There are epigastric and paraesophageal varices. --Small bowel: No dilatation or inflammation. --Colon: No focal abnormality. --Appendix: Normal. Vascular/Lymphatic: Atherosclerotic calcification is present within the non-aneurysmal abdominal aorta, without hemodynamically significant stenosis. The main portal vein, splenic vein and superior mesenteric vein are patent. There are intermediate caliber varices within the anterior abdominal fat. No abdominal or pelvic lymphadenopathy. Reproductive: Normal prostate and seminal vesicles. Musculoskeletal. No pelvic fractures. Other: None. IMPRESSION: 1. No acute abnormality of the chest, abdomen or pelvis. 2. Hepatic cirrhosis and sequelae of portal hypertension including splenomegaly,  recanalized umbilical vein and epigastric and paraesophageal varices. Aortic Atherosclerosis (ICD10-I70.0). Electronically Signed   By: Deatra Robinson M.D.   On: 03/11/2023 00:24   CT Angio Neck W and/or Wo Contrast  Result Date: 03/11/2023 CLINICAL DATA:  Found down.  Vomiting.  Possible hanging. EXAM: CT ANGIOGRAPHY NECK TECHNIQUE: Multidetector CT imaging of the neck was performed using the standard protocol during bolus administration of intravenous contrast. Multiplanar CT image reconstructions and MIPs were obtained to evaluate the vascular anatomy. Carotid stenosis measurements (when applicable) are obtained utilizing NASCET criteria, using the distal internal carotid diameter as the denominator. RADIATION DOSE REDUCTION: This exam was performed according to the departmental dose-optimization program which includes automated exposure control, adjustment of the mA and/or kV according to patient size and/or use of iterative reconstruction technique. CONTRAST:  OMNIPAQUE IOHEXOL 350 MG/ML SOLN COMPARISON:  None Available. FINDINGS: Aortic arch: Standard branching. Imaged portion shows no evidence of aneurysm or dissection. No significant stenosis of the major arch vessel origins. Right carotid system: No evidence of dissection, stenosis (50% or greater) or occlusion. Left carotid system: No evidence of dissection, stenosis (50% or greater) or occlusion. Vertebral  arteries: Codominant. No evidence of dissection, stenosis (50% or greater) or occlusion. Skeleton: No fracture Other neck: Negative Upper chest: Clear IMPRESSION: Normal CTA of the neck. Electronically Signed   By: Deatra Robinson M.D.   On: 03/11/2023 00:14   CT HEAD WO CONTRAST ( )  Result Date: 03/11/2023 CLINICAL DATA:  Trauma EXAM: CT HEAD WITHOUT CONTRAST TECHNIQUE: Contiguous axial images were obtained from the base of the skull through the vertex without intravenous contrast. RADIATION DOSE REDUCTION: This exam was performed according  to the departmental dose-optimization program which includes automated exposure control, adjustment of the mA and/or kV according to patient size and/or use of iterative reconstruction technique. COMPARISON:  Head CT 08/31/2018 FINDINGS: Brain: No evidence of acute infarction, hemorrhage, hydrocephalus, extra-axial collection or mass lesion/mass effect. Mild diffuse atrophy is again seen Vascular: No hyperdense vessel or unexpected calcification. Skull: Normal. Negative for fracture or focal lesion. Sinuses/Orbits: No acute finding. Other: There is right periorbital soft tissue swelling. IMPRESSION: 1. Right periorbital soft tissue swelling. 2. No acute intracranial process. Electronically Signed   By: Darliss Cheney M.D.   On: 03/11/2023 00:12   DG Chest Portable 1 View  Result Date: 03/10/2023 CLINICAL DATA:  Evaluate infiltrate EXAM: PORTABLE CHEST 1 VIEW COMPARISON:  10/15/2017 FINDINGS: The heart size and mediastinal contours are within normal limits. Both lungs are clear. The visualized skeletal structures are unremarkable. IMPRESSION: No active disease. Electronically Signed   By: Alcide Clever M.D.   On: 03/10/2023 21:37    Microbiology: Results for orders placed or performed during the hospital encounter of 03/10/23  Blood Culture (routine x 2)     Status: None   Collection Time: 03/10/23 11:22 PM   Specimen: BLOOD  Result Value Ref Range Status   Specimen Description   Final    BLOOD BLOOD RIGHT ARM Performed at [facility], [address], [place], [place] [address]    Special Requests   Final    BOTTLES DRAWN AEROBIC AND ANAEROBIC Blood Culture results may not be optimal due to an inadequate volume of blood received in culture bottles Performed at [facility], [address] [place], [place] [address]    Culture   Final    NO GROWTH 5 DAYS Performed at [facility], [address], [place], [place] [address]    Report Status 03/15/2023 FINAL  Final   Blood Culture (routine x 2)     Status: None   Collection Time: 03/10/23 11:27 PM   Specimen: BLOOD  Result Value Ref Range Status   Specimen Description   Final    BLOOD BLOOD LEFT ARM Performed at [facility], [address], [place], [place] [address]    Special Requests   Final    BOTTLES DRAWN AEROBIC AND ANAEROBIC Blood Culture adequate volume Performed at [facility], [address], [place], [place] [address]    Culture   Final    NO GROWTH 5 DAYS Performed at [facility], [address] [place], [place] [address]    Report Status 03/15/2023 FINAL  Final  Resp panel by RT-PCR (RSV, Flu A&B, Covid) Urine, Clean Catch     Status: None   Collection Time: 03/11/23 12:04 AM   Specimen: Urine, Clean Catch; Nasal Swab  Result Value Ref Range Status   SARS Coronavirus 2 by RT PCR NEGATIVE NEGATIVE Final    Comment: (NOTE) SARS-CoV-2 target nucleic acids are NOT DETECTED.  The SARS-CoV-2 RNA is  generally detectable in upper respiratory specimens during the acute phase of infection. The lowest concentration of SARS-CoV-2 viral copies this assay can detect is 138 copies/mL. A negative result does not preclude SARS-Cov-2 infection and should not be used as the sole basis for treatment or other patient management decisions. A negative result may occur with  improper specimen collection/handling, submission of specimen other than nasopharyngeal swab, presence of viral mutation(s) within the areas targeted by this assay, and inadequate number of viral copies(<138 copies/mL). A negative result must be combined with clinical observations, patient history, and epidemiological information. The expected result is Negative.  Fact Sheet for Patients:  BloggerCourse.com  Fact Sheet for Healthcare Providers:  SeriousBroker.it  This test is no t yet approved or cleared by the [place] FDA and  has been  authorized for detection and/or diagnosis of SARS-CoV-2 by FDA under an Emergency Use Authorization (EUA). This EUA will remain  in effect (meaning this test can be used) for the duration of the COVID-19 declaration under Section 564(b)(1) of the Act, 21 U.S.C.section 360bbb-3(b)(1), unless the authorization is terminated  or revoked sooner.       Influenza A by PCR NEGATIVE NEGATIVE Final   Influenza B by PCR NEGATIVE NEGATIVE Final    Comment: (NOTE) The Xpert Xpress SARS-CoV-2/FLU/RSV plus assay is intended as an aid in the diagnosis of influenza from Nasopharyngeal swab specimens and should not be used as a sole basis for treatment. Nasal washings and aspirates are unacceptable for Xpert Xpress SARS-CoV-2/FLU/RSV testing.  Fact Sheet for Patients: BloggerCourse.com  Fact Sheet for Healthcare Providers: SeriousBroker.it  This test is not yet approved or cleared by the [place] FDA and has been authorized for detection and/or diagnosis of SARS-CoV-2 by FDA under an Emergency Use Authorization (EUA). This EUA will remain in effect (meaning this test can be used) for the duration of the COVID-19 declaration under Section 564(b)(1) of the Act, 21 U.S.C. section 360bbb-3(b)(1), unless the authorization is terminated or revoked.     Resp Syncytial Virus by PCR NEGATIVE NEGATIVE Final    Comment: (NOTE) Fact Sheet for Patients: BloggerCourse.com  Fact Sheet for Healthcare Providers: SeriousBroker.it  This test is not yet approved or cleared by the [place] FDA and has been authorized for detection and/or diagnosis of SARS-CoV-2 by FDA under an Emergency Use Authorization (EUA). This EUA will remain in effect (meaning this test can be used) for the duration of the COVID-19 declaration under Section 564(b)(1) of the Act, 21 U.S.C. section 360bbb-3(b)(1), unless the  authorization is terminated or revoked.  Performed at [facility], [address] [place] [address] [place], [place] [address]   Aerobic Culture w Gram Stain (superficial specimen)     Status: None   Collection Time: 03/11/23  3:43 AM   Specimen: Foot  Result Value Ref Range Status   Specimen Description FOOT  Final   Special Requests LEFT  Final   Gram Stain   Final    FEW WBC SEEN ABUNDANT GRAM NEGATIVE RODS ABUNDANT GRAM POSITIVE COCCI MODERATE GRAM POSITIVE RODS    Culture   Final    ABUNDANT GROUP B STREP(S.AGALACTIAE)ISOLATED FEW PROTEUS MIRABILIS TESTING AGAINST S. AGALACTIAE NOT ROUTINELY PERFORMED DUE TO PREDICTABILITY OF AMP/PEN/VAN SUSCEPTIBILITY. WITHIN MIXED ORGANISMS Performed at [facility], [address] [place], [place] [address]    Report Status 03/14/2023 FINAL  Final   Organism ID, Bacteria PROTEUS MIRABILIS  Final      Susceptibility   Proteus mirabilis -  MIC*    AMPICILLIN <=2 SENSITIVE Sensitive     CEFEPIME <=0.12 SENSITIVE Sensitive     CEFTAZIDIME <=1 SENSITIVE Sensitive     CEFTRIAXONE <=0.25 SENSITIVE Sensitive     CIPROFLOXACIN <=0.25 SENSITIVE Sensitive     GENTAMICIN <=1 SENSITIVE Sensitive     IMIPENEM 2 SENSITIVE Sensitive     TRIMETH/SULFA <=20 SENSITIVE Sensitive     AMPICILLIN/SULBACTAM <=2 SENSITIVE Sensitive     PIP/TAZO <=4 SENSITIVE Sensitive     * FEW PROTEUS MIRABILIS    Labs: CBC: Recent Labs  Lab 03/10/23 2058 03/13/23 0643 03/14/23 0524 03/15/23 0356  WBC 7.4 1.9* 2.9* 3.6*  HGB 13.3 10.4* 10.2* 11.3*  HCT 40.8 31.2* 30.9* 34.8*  MCV 90.9 89.4 90.6 92.1  PLT 52* 29* 37* 45*   Basic Metabolic Panel: Recent Labs  Lab 03/10/23 2058 03/13/23 0643 03/14/23 0524 03/15/23 0356 03/16/23 0522  NA 144 137 137 137 136  K 3.5 2.9* 3.4* 4.3 4.2  CL 114* 111 109 109 105  CO2 20* 21* 22 23 24   GLUCOSE 112* 144* 94 90 118*  BUN 7 8 7 8 8   CREATININE 0.58* 0.59* 0.46* 0.59* 0.53*  CALCIUM 8.2* 7.8* 8.0* 8.4* 9.1   MG  --  1.5* 1.8 1.6* 1.6*   Liver Function Tests: Recent Labs  Lab 03/10/23 2058 03/13/23 0643  AST 71* 47*  ALT 26 20  ALKPHOS 153* 105  BILITOT 2.7* 2.4*  PROT 7.4 5.8*  ALBUMIN 3.5 2.4*   CBG: No results for input(s): "GLUCAP" in the last 168 hours.  Discharge time spent: greater than 30 minutes.  Signed: Marcelino Duster, MD [facility] 03/16/2023

## 2023-03-16 NOTE — TOC Progression Note (Signed)
Transition of Care [facility]) - Progression Note    Patient Details  Name: Derrick Blanchard MRN: [id number] Date of Birth: 04/29/1981  Transition of Care [facility]) CM/SW Contact  Garret Reddish, RN Phone Number: 03/16/2023, 9:48 AM  Clinical Narrative:   Received call from [employer] informing me that patient had received a rollator in 2023 and the insurance will not pay for another rolling walker.    I have informed patient of this information.  I have informed patient that he could go to [place], [employer] or [employer] to [employer] a rolling walker.    Patient informs me that he is not feeling well today.  He would like to stay in the hospital another day.  He reports that he woke up feeling sick on his stomach.    He informs me that he does not have any money to pay for his medications.  I have informed him that I can ask the pharmacy to bill his medication to his hospital account.  Patient may need assistance with transport home.    TOC will continue to follow for discharge planning.          Expected Discharge Plan and Services                                               Social Determinants of Health (SDOH) Interventions SDOH Screenings   Food Insecurity: Food Insecurity Present (03/11/2023)  Housing: High Risk (03/11/2023)  Transportation Needs: Patient Declined (03/11/2023)  Utilities: Patient Declined (03/11/2023)  Financial Resource Strain: High Risk (03/22/2022)   Received from [facility]  Tobacco Use: High Risk (03/10/2023)    Readmission Risk Interventions     No data to display

## 2023-04-05 ENCOUNTER — Ambulatory Visit: Payer: Medicaid Other | Admitting: Podiatry

## 2023-10-08 ENCOUNTER — Other Ambulatory Visit: Payer: Self-pay

## 2023-10-08 ENCOUNTER — Emergency Department: Payer: 59

## 2023-10-08 ENCOUNTER — Emergency Department
Admission: EM | Admit: 2023-10-08 | Discharge: 2023-10-08 | Disposition: A | Discharge status: Home / Self Care | Payer: 59 | Attending: Emergency Medicine | Admitting: Emergency Medicine

## 2023-10-08 DIAGNOSIS — X58XXXA Exposure to other specified factors, initial encounter: Secondary | ICD-10-CM | POA: Insufficient documentation

## 2023-10-08 DIAGNOSIS — L089 Local infection of the skin and subcutaneous tissue, unspecified: Secondary | ICD-10-CM | POA: Insufficient documentation

## 2023-10-08 DIAGNOSIS — S91302A Unspecified open wound, left foot, initial encounter: Secondary | ICD-10-CM | POA: Insufficient documentation

## 2023-10-08 DIAGNOSIS — Z5900 Homelessness unspecified: Secondary | ICD-10-CM | POA: Insufficient documentation

## 2023-10-08 LAB — COMPREHENSIVE METABOLIC PANEL
ALT: 24 U/L (ref 0–44)
AST: 59 U/L — ABNORMAL HIGH (ref 15–41)
Albumin: 3.3 g/dL — ABNORMAL LOW (ref 3.5–5.0)
Alkaline Phosphatase: 91 U/L (ref 38–126)
Anion gap: 9 (ref 5–15)
BUN: 8 mg/dL (ref 6–20)
CO2: 22 mmol/L (ref 22–32)
Calcium: 8.7 mg/dL — ABNORMAL LOW (ref 8.9–10.3)
Chloride: 112 mmol/L — ABNORMAL HIGH (ref 98–111)
Creatinine, Ser: 0.68 mg/dL (ref 0.61–1.24)
GFR, Estimated: >60 mL/min (ref >60)
Glucose, Bld: 91 mg/dL (ref 70–99)
Potassium: 3.2 mmol/L — ABNORMAL LOW (ref 3.5–5.1)
Sodium: 143 mmol/L (ref 135–145)
Total Bilirubin: 2.9 mg/dL — ABNORMAL HIGH (ref 0.0–1.2)
Total Protein: 6.5 g/dL (ref 6.5–8.1)

## 2023-10-08 LAB — CBC WITH DIFFERENTIAL/PLATELET
Abs Immature Granulocytes: 0.01 10*3/uL (ref 0.00–0.07)
Basophils Absolute: 0 10*3/uL (ref 0.0–0.1)
Basophils Relative: 1 %
Eosinophils Absolute: 0.1 10*3/uL (ref 0.0–0.5)
Eosinophils Relative: 1 %
HCT: 38.7 % — ABNORMAL LOW (ref 39.0–52.0)
Hemoglobin: 12.7 g/dL — ABNORMAL LOW (ref 13.0–17.0)
Immature Granulocytes: 0 %
Lymphocytes Relative: 28 %
Lymphs Abs: 1.3 10*3/uL (ref 0.7–4.0)
MCH: 30.2 pg (ref 26.0–34.0)
MCHC: 32.8 g/dL (ref 30.0–36.0)
MCV: 92.1 fL (ref 80.0–100.0)
Monocytes Absolute: 0.6 10*3/uL (ref 0.1–1.0)
Monocytes Relative: 12 %
Neutro Abs: 2.8 10*3/uL (ref 1.7–7.7)
Neutrophils Relative %: 58 %
Platelets: 58 10*3/uL — ABNORMAL LOW (ref 150–400)
RBC: 4.2 MIL/uL — ABNORMAL LOW (ref 4.22–5.81)
RDW: 16.1 % — ABNORMAL HIGH (ref 11.5–15.5)
WBC: 4.8 10*3/uL (ref 4.0–10.5)
nRBC: 0 % (ref 0.0–0.2)

## 2023-10-08 LAB — LACTIC ACID, PLASMA
Lactic Acid, Venous: 0.9 mmol/L (ref 0.5–1.9)
Lactic Acid, Venous: 1.1 mmol/L (ref 0.5–1.9)

## 2023-10-08 LAB — ETHANOL: Alcohol, Ethyl (B): 358 mg/dL (ref <10)

## 2023-10-08 MED ORDER — AMOXICILLIN-POT CLAVULANATE 875-125 MG PO TABS
1.0000 | ORAL_TABLET | Freq: Once | ORAL | Status: AC
  Administered 2023-10-08: 1 via ORAL
  Filled 2023-10-08: qty 1

## 2023-10-08 MED ORDER — SULFAMETHOXAZOLE-TRIMETHOPRIM 800-160 MG PO TABS
2.0000 | ORAL_TABLET | Freq: Two times a day (BID) | ORAL | 0 refills | Status: AC
  Filled 2023-10-08: qty 56, 14d supply, fill #0

## 2023-10-08 MED ORDER — OXYCODONE-ACETAMINOPHEN 5-325 MG PO TABS
1.0000 | ORAL_TABLET | Freq: Four times a day (QID) | ORAL | 0 refills | Status: DC | PRN
  Filled 2023-10-08: qty 10, 3d supply, fill #0

## 2023-10-08 MED ORDER — SULFAMETHOXAZOLE-TRIMETHOPRIM 800-160 MG PO TABS
2.0000 | ORAL_TABLET | Freq: Once | ORAL | Status: AC
  Administered 2023-10-08: 2 via ORAL
  Filled 2023-10-08: qty 2

## 2023-10-08 MED ORDER — AMOXICILLIN-POT CLAVULANATE 875-125 MG PO TABS
1.0000 | ORAL_TABLET | Freq: Two times a day (BID) | ORAL | 0 refills | Status: AC
  Filled 2023-10-08: qty 28, 14d supply, fill #0

## 2023-10-08 MED ORDER — HYDROCODONE-ACETAMINOPHEN 5-325 MG PO TABS
2.0000 | ORAL_TABLET | Freq: Once | ORAL | Status: AC
  Administered 2023-10-08: 2 via ORAL
  Filled 2023-10-08: qty 2

## 2023-10-08 NOTE — ED Provider Notes (Signed)
[facility] Provider Note    Event Date/Time   First MD Initiated Contact with Patient 10/08/23 0422     (approximate)   History   Wound Infection   HPI Derrick Blanchard is a 43 y.o. male who is homeless and presents for evaluation of a wound and increasing pain on the bottom of his left foot.  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  He was admitted to this hospital in the past for an infection on his left foot which made him septic.  He drinks alcohol regularly and has had issues in the past with hematemesis as well as lower GI bleeding and claims that he is having some blood in his stool at this time as well.  Mostly he states that he is worried about his foot and that it is more swollen and painful than usual.  No chest pain or shortness of breath.  No abdominal pain.     Physical Exam   Triage Vital Signs: ED Triage Vitals  Encounter Vitals Group     BP 10/08/23 0211 106/68     Systolic BP Percentile --      Diastolic BP Percentile --      Pulse Rate 10/08/23 0211 76     Resp 10/08/23 0211 16     Temp 10/08/23 0211 98.2 F (36.8 C)     Temp Source 10/08/23 0211 Oral     SpO2 10/08/23 0211 96 %     Weight --      Height 10/08/23 0209 1.727 m (5\' 8" )     Head Circumference --      Peak Flow --      Pain Score 10/08/23 0208 8     Pain Loc --      Pain Education --      Exclude from Growth Chart --     Most recent vital signs: Vitals:   10/08/23 0211 10/08/23 0602  BP: 106/68 107/71  Pulse: 76 84  Resp: 16 17  Temp: 98.2 F (36.8 C) (!) 97.3 F (36.3 C)  SpO2: 96% 98%    General: Awake, no obvious distress, ate and drink a full meal while waiting.  Very disheveled and dirty consistent with his unhoused status. CV:  Good peripheral perfusion.  No tachycardia, regular rhythm Resp:  Normal effort. Speaking easily and comfortably, no accessory muscle usage  nor intercostal retractions.   Abd:  No distention.  Mild tenderness to palpation throughout, but without rebound or guarding. 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  No purulent drainage at this time.  Patient is status post prior amputation of second toe.  His right foot is notable for amputation of all toes but with no persistent wounds.     ED Results / Procedures / Treatments   Labs (all labs ordered are listed, but only abnormal results are displayed) Labs Reviewed  COMPREHENSIVE METABOLIC PANEL - Abnormal; Notable for the following components:      Result Value   Potassium 3.2 (*)    Chloride 112 (*)    Calcium 8.7 (*)    Albumin 3.3 (*)    AST 59 (*)    Total Bilirubin 2.9 (*)    All other components within  normal limits  CBC WITH DIFFERENTIAL/PLATELET - Abnormal; Notable for the following components:   RBC 4.20 (*)    Hemoglobin 12.7 (*)    HCT 38.7 (*)    RDW 16.1 (*)    Platelets 58 (*)    All other components within normal limits  ETHANOL - Abnormal; Notable for the following components:   Alcohol, Ethyl (B) 358 (*)    All other components within normal limits  LACTIC ACID, PLASMA  LACTIC ACID, PLASMA     RADIOLOGY I viewed and interpreted the patient's foot x-rays and there is no evidence of osteomyelitis.  Radiologist confirmed plantar soft tissue wound but no bone involvement.   PROCEDURES:  Critical Care performed: No  Procedures    IMPRESSION / MDM / ASSESSMENT AND PLAN / ED COURSE  I reviewed the triage vital signs and the nursing notes.                              Differential diagnosis includes, but is not limited to, wound infection, foot cellulitis, osteomyelitis, complication of alcohol abuse including variceal bleeding or ulcer.  Patient's presentation is most consistent with acute presentation with  potential threat to life or bodily function.  Labs/studies ordered: Lactic acid, ethanol level since the patient reported he had been drinking alcohol, CMP, CBC with differential, left foot x-rays  Interventions/Medications given:  Medications  amoxicillin-clavulanate (AUGMENTIN) 875-125 MG per tablet 1 tablet (1 tablet Oral Given 10/08/23 0738)  sulfamethoxazole-trimethoprim (BACTRIM DS) 800-160 MG per tablet 2 tablet (2 tablets Oral Given 10/08/23 0738)  HYDROcodone-acetaminophen (NORCO/VICODIN) 5-325 MG per tablet 2 tablet (2 tablets Oral Given 10/08/23 0738)    (Note:  hospital course my include additional interventions and/or labs/studies not listed above.)   Vital signs reassuring.  Labs are all essentially normal other than some slight abnormalities on his CMP which are to be anticipated given his alcohol abuse.  His ethanol level is 358 but by 6 AM he was seeming and acting clinically sober and ambulatory to and from the bathroom in his room without difficulty.  Of particular note, he has no leukocytosis and an essentially normal hemoglobin.  Though he does have thrombocytopenia, he actually has a higher platelet count than he has in the past on repeated occasions.  The patient has many complaints and concerns but the primary 1 is his foot.  I reassured him he does not have a bone infection and I agree that he needs additional wound care and podiatry management, but this can be done on a nonemergent, outpatient basis.  I am treating him broadly with Augmentin and Bactrim for a full 14-day course and I stressed to him the importance of taking the antibiotics.  An acknowledgment of the acute infection that is likely surrounding the chronic wound, I wrote him a short course of Percocet as well, though I expressed my concern about his alcohol use in conjunction with the opioids.  However I think it is reasonable and appropriate at this time for a small number of pills, as well as treatment in the  emergency department as listed above.  In acknowledgment of his unhoused status and financial limitations, I contacted the pharmacy and tried to see if we could give him some more all of the prescriptions, but that is not allowed by pharmacy policy.  That was explained to the patient and he has a dose of antibiotics now so if he cannot pick up  his prescriptions until tomorrow and should be okay.  I gave my usual follow-up recommendations with wound care and podiatry and my return precautions.         FINAL CLINICAL IMPRESSION(S) / ED DIAGNOSES   Final diagnoses:  Wound infection     Rx / DC Orders   ED Discharge Orders          Ordered    sulfamethoxazole-trimethoprim (BACTRIM DS) 800-160 MG tablet  2 times daily        10/08/23 0718    amoxicillin-clavulanate (AUGMENTIN) 875-125 MG tablet  2 times daily        10/08/23 0718    oxyCODONE-acetaminophen (PERCOCET) 5-325 MG tablet  Every 6 hours PRN        10/08/23 0720    AMB referral to wound care center       Comments: worsening left plantar foot wound   10/08/23 0723             Note:  This document was prepared using Dragon voice recognition software and may include unintentional dictation errors.   Loleta Rose, MD 10/08/23 [phone]

## 2023-10-08 NOTE — Discharge Instructions (Addendum)
Please take the full course of both antibiotics prescribed.  The [facility] pharmacy is not open until tomorrow (Monday) morning at 7:30 AM, but we gave you antibiotics in the emergency department that should be adequate for you until you can go to the pharmacy.  We also wrote a prescription for a few pain pills to help with the acute pain from the new infection of the chronic wound.  You will not get any refills on the pain medication after it is gone and should continue to use over-the-counter medication like ibuprofen and Tylenol.  Please try not to bear weight on your left foot is much as possible, keep it elevated and try to keep it clean and dry.  We strongly encourage you to follow-up with podiatry, Dr. Excell Seltzer or one of his colleagues at the number and address listed above.  We also referred you to the wound care clinic, and encouraged you to follow-up with them as well.    Return to the emergency department if you develop new or worsening symptoms that concern you.

## 2023-10-08 NOTE — ED Triage Notes (Addendum)
[place] ed [place] the streets for foot infection x 2 months. Pt has a wound on the bottom of his left foot. ETOH on board.   153/86 88 HR  98% RA  Pt is caox4, in no acute distress in triage. Pt has pain in his foot.

## 2023-10-08 NOTE — ED Notes (Signed)
FULL rainbow sent down.

## 2023-10-08 NOTE — ED Notes (Signed)
Pt ask for water, advised pt he should not eat or drink anything until he sees the doctor.

## 2023-10-08 NOTE — ED Notes (Signed)
Attempted to get patient transportation. No more taxi voucher per charge RN

## 2023-10-09 ENCOUNTER — Other Ambulatory Visit: Payer: Self-pay

## 2023-11-15 ENCOUNTER — Encounter: Payer: Self-pay | Admitting: Physician Assistant

## 2023-11-15 ENCOUNTER — Encounter: Payer: 59 | Attending: Physician Assistant | Admitting: Physician Assistant

## 2023-11-15 DIAGNOSIS — B182 Chronic viral hepatitis C: Secondary | ICD-10-CM | POA: Diagnosis not present

## 2023-11-15 DIAGNOSIS — F10288 Alcohol dependence with other alcohol-induced disorder: Secondary | ICD-10-CM | POA: Insufficient documentation

## 2023-11-15 DIAGNOSIS — L97522 Non-pressure chronic ulcer of other part of left foot with fat layer exposed: Secondary | ICD-10-CM | POA: Insufficient documentation

## 2023-11-15 DIAGNOSIS — G603 Idiopathic progressive neuropathy: Secondary | ICD-10-CM | POA: Diagnosis not present

## 2023-11-15 DIAGNOSIS — K7030 Alcoholic cirrhosis of liver without ascites: Secondary | ICD-10-CM | POA: Diagnosis not present

## 2023-11-15 DIAGNOSIS — L03116 Cellulitis of left lower limb: Secondary | ICD-10-CM | POA: Diagnosis not present

## 2023-11-20 ENCOUNTER — Other Ambulatory Visit: Payer: Self-pay | Admitting: Physician Assistant

## 2023-11-20 DIAGNOSIS — L97522 Non-pressure chronic ulcer of other part of left foot with fat layer exposed: Secondary | ICD-10-CM

## 2023-11-20 DIAGNOSIS — L03116 Cellulitis of left lower limb: Secondary | ICD-10-CM

## 2023-11-20 DIAGNOSIS — M86172 Other acute osteomyelitis, left ankle and foot: Secondary | ICD-10-CM

## 2023-11-23 ENCOUNTER — Encounter: Admitting: Physician Assistant

## 2023-11-23 DIAGNOSIS — L97522 Non-pressure chronic ulcer of other part of left foot with fat layer exposed: Secondary | ICD-10-CM | POA: Diagnosis not present

## 2023-11-30 ENCOUNTER — Encounter: Admitting: Internal Medicine

## 2023-11-30 ENCOUNTER — Other Ambulatory Visit: Payer: Self-pay

## 2023-11-30 DIAGNOSIS — L97522 Non-pressure chronic ulcer of other part of left foot with fat layer exposed: Secondary | ICD-10-CM | POA: Diagnosis not present

## 2023-11-30 MED ORDER — AMOXICILLIN-POT CLAVULANATE 875-125 MG PO TABS
1.0000 | ORAL_TABLET | Freq: Two times a day (BID) | ORAL | 0 refills | Status: DC
  Filled 2023-11-30: qty 14, 7d supply, fill #0

## 2023-12-08 ENCOUNTER — Encounter: Attending: Physician Assistant | Admitting: Physician Assistant

## 2023-12-08 DIAGNOSIS — L97522 Non-pressure chronic ulcer of other part of left foot with fat layer exposed: Secondary | ICD-10-CM | POA: Insufficient documentation

## 2023-12-08 DIAGNOSIS — G603 Idiopathic progressive neuropathy: Secondary | ICD-10-CM | POA: Diagnosis present

## 2023-12-08 DIAGNOSIS — L03116 Cellulitis of left lower limb: Secondary | ICD-10-CM | POA: Insufficient documentation

## 2023-12-08 DIAGNOSIS — B182 Chronic viral hepatitis C: Secondary | ICD-10-CM | POA: Diagnosis not present

## 2023-12-08 DIAGNOSIS — F10288 Alcohol dependence with other alcohol-induced disorder: Secondary | ICD-10-CM | POA: Diagnosis not present

## 2023-12-08 DIAGNOSIS — K7030 Alcoholic cirrhosis of liver without ascites: Secondary | ICD-10-CM | POA: Diagnosis not present

## 2023-12-12 ENCOUNTER — Ambulatory Visit: Admitting: Physician Assistant

## 2023-12-13 ENCOUNTER — Emergency Department

## 2023-12-13 ENCOUNTER — Other Ambulatory Visit: Payer: Self-pay

## 2023-12-13 ENCOUNTER — Observation Stay

## 2023-12-13 ENCOUNTER — Encounter: Admitting: Physician Assistant

## 2023-12-13 ENCOUNTER — Encounter: Payer: Self-pay | Admitting: Family Medicine

## 2023-12-13 ENCOUNTER — Inpatient Hospital Stay
Admission: EM | Admit: 2023-12-13 | Discharge: 2023-12-22 | DRG: 580 | Disposition: A | Discharge status: Home Health | Attending: Osteopathic Medicine | Admitting: Osteopathic Medicine

## 2023-12-13 DIAGNOSIS — I10 Essential (primary) hypertension: Secondary | ICD-10-CM | POA: Diagnosis present

## 2023-12-13 DIAGNOSIS — M86172 Other acute osteomyelitis, left ankle and foot: Secondary | ICD-10-CM

## 2023-12-13 DIAGNOSIS — K766 Portal hypertension: Secondary | ICD-10-CM | POA: Diagnosis present

## 2023-12-13 DIAGNOSIS — Z801 Family history of malignant neoplasm of trachea, bronchus and lung: Secondary | ICD-10-CM

## 2023-12-13 DIAGNOSIS — D696 Thrombocytopenia, unspecified: Secondary | ICD-10-CM | POA: Diagnosis present

## 2023-12-13 DIAGNOSIS — D689 Coagulation defect, unspecified: Secondary | ICD-10-CM | POA: Diagnosis present

## 2023-12-13 DIAGNOSIS — F1010 Alcohol abuse, uncomplicated: Secondary | ICD-10-CM | POA: Diagnosis present

## 2023-12-13 DIAGNOSIS — D61818 Other pancytopenia: Secondary | ICD-10-CM | POA: Diagnosis present

## 2023-12-13 DIAGNOSIS — K7030 Alcoholic cirrhosis of liver without ascites: Secondary | ICD-10-CM | POA: Diagnosis present

## 2023-12-13 DIAGNOSIS — D649 Anemia, unspecified: Secondary | ICD-10-CM | POA: Diagnosis present

## 2023-12-13 DIAGNOSIS — Z89422 Acquired absence of other left toe(s): Secondary | ICD-10-CM

## 2023-12-13 DIAGNOSIS — Z6832 Body mass index (BMI) 32.0-32.9, adult: Secondary | ICD-10-CM

## 2023-12-13 DIAGNOSIS — S91302A Unspecified open wound, left foot, initial encounter: Secondary | ICD-10-CM | POA: Diagnosis not present

## 2023-12-13 DIAGNOSIS — S81802A Unspecified open wound, left lower leg, initial encounter: Secondary | ICD-10-CM

## 2023-12-13 DIAGNOSIS — M869 Osteomyelitis, unspecified: Secondary | ICD-10-CM

## 2023-12-13 DIAGNOSIS — K219 Gastro-esophageal reflux disease without esophagitis: Secondary | ICD-10-CM | POA: Diagnosis present

## 2023-12-13 DIAGNOSIS — F1020 Alcohol dependence, uncomplicated: Secondary | ICD-10-CM | POA: Insufficient documentation

## 2023-12-13 DIAGNOSIS — L97529 Non-pressure chronic ulcer of other part of left foot with unspecified severity: Principal | ICD-10-CM | POA: Diagnosis present

## 2023-12-13 DIAGNOSIS — L089 Local infection of the skin and subcutaneous tissue, unspecified: Principal | ICD-10-CM | POA: Diagnosis present

## 2023-12-13 DIAGNOSIS — F17210 Nicotine dependence, cigarettes, uncomplicated: Secondary | ICD-10-CM | POA: Diagnosis present

## 2023-12-13 DIAGNOSIS — Z5309 Procedure and treatment not carried out because of other contraindication: Secondary | ICD-10-CM | POA: Diagnosis present

## 2023-12-13 DIAGNOSIS — Z751 Person awaiting admission to adequate facility elsewhere: Secondary | ICD-10-CM

## 2023-12-13 DIAGNOSIS — Z79899 Other long term (current) drug therapy: Secondary | ICD-10-CM

## 2023-12-13 DIAGNOSIS — F1090 Alcohol use, unspecified, uncomplicated: Secondary | ICD-10-CM | POA: Insufficient documentation

## 2023-12-13 DIAGNOSIS — E66811 Obesity, class 1: Secondary | ICD-10-CM | POA: Diagnosis present

## 2023-12-13 LAB — CBC WITH DIFFERENTIAL/PLATELET
Abs Immature Granulocytes: 0.01 10*3/uL (ref 0.00–0.07)
Basophils Absolute: 0 10*3/uL (ref 0.0–0.1)
Basophils Relative: 1 %
Eosinophils Absolute: 0 10*3/uL (ref 0.0–0.5)
Eosinophils Relative: 1 %
HCT: 33.4 % — ABNORMAL LOW (ref 39.0–52.0)
Hemoglobin: 10.4 g/dL — ABNORMAL LOW (ref 13.0–17.0)
Immature Granulocytes: 1 %
Lymphocytes Relative: 25 %
Lymphs Abs: 0.4 10*3/uL — ABNORMAL LOW (ref 0.7–4.0)
MCH: 26.5 pg (ref 26.0–34.0)
MCHC: 31.1 g/dL (ref 30.0–36.0)
MCV: 85 fL (ref 80.0–100.0)
Monocytes Absolute: 0.3 10*3/uL (ref 0.1–1.0)
Monocytes Relative: 20 %
Neutro Abs: 0.8 10*3/uL — ABNORMAL LOW (ref 1.7–7.7)
Neutrophils Relative %: 52 %
Platelets: 39 10*3/uL — ABNORMAL LOW (ref 150–400)
RBC: 3.93 MIL/uL — ABNORMAL LOW (ref 4.22–5.81)
RDW: 18.3 % — ABNORMAL HIGH (ref 11.5–15.5)
Smear Review: DECREASED
WBC: 1.6 10*3/uL — ABNORMAL LOW (ref 4.0–10.5)
nRBC: 0 % (ref 0.0–0.2)

## 2023-12-13 LAB — HEPATIC FUNCTION PANEL
ALT: 27 U/L (ref 0–44)
AST: 54 U/L — ABNORMAL HIGH (ref 15–41)
Albumin: 3.2 g/dL — ABNORMAL LOW (ref 3.5–5.0)
Alkaline Phosphatase: 97 U/L (ref 38–126)
Bilirubin, Direct: 0.9 mg/dL — ABNORMAL HIGH (ref 0.0–0.2)
Indirect Bilirubin: 2.2 mg/dL — ABNORMAL HIGH (ref 0.3–0.9)
Total Bilirubin: 3.1 mg/dL — ABNORMAL HIGH (ref 0.0–1.2)
Total Protein: 6.1 g/dL — ABNORMAL LOW (ref 6.5–8.1)

## 2023-12-13 LAB — PATHOLOGIST SMEAR REVIEW

## 2023-12-13 LAB — BASIC METABOLIC PANEL WITH GFR
Anion gap: 4 — ABNORMAL LOW (ref 5–15)
BUN: 12 mg/dL (ref 6–20)
CO2: 24 mmol/L (ref 22–32)
Calcium: 9.3 mg/dL (ref 8.9–10.3)
Chloride: 108 mmol/L (ref 98–111)
Creatinine, Ser: 0.58 mg/dL — ABNORMAL LOW (ref 0.61–1.24)
GFR, Estimated: >60 mL/min (ref >60)
Glucose, Bld: 99 mg/dL (ref 70–99)
Potassium: 3.3 mmol/L — ABNORMAL LOW (ref 3.5–5.1)
Sodium: 136 mmol/L (ref 135–145)

## 2023-12-13 LAB — SEDIMENTATION RATE: Sed Rate: 5 mm/h (ref 0–15)

## 2023-12-13 LAB — C-REACTIVE PROTEIN: CRP: 0.5 mg/dL (ref <1.0)

## 2023-12-13 LAB — URINE DRUG SCREEN, QUALITATIVE (ARMC ONLY)
Amphetamines, Ur Screen: NOT DETECTED
Barbiturates, Ur Screen: NOT DETECTED
Benzodiazepine, Ur Scrn: NOT DETECTED
Cannabinoid 50 Ng, Ur ~~LOC~~: POSITIVE — AB
Cocaine Metabolite,Ur ~~LOC~~: NOT DETECTED
MDMA (Ecstasy)Ur Screen: NOT DETECTED
Methadone Scn, Ur: NOT DETECTED
Opiate, Ur Screen: POSITIVE — AB
Phencyclidine (PCP) Ur S: NOT DETECTED
Tricyclic, Ur Screen: NOT DETECTED

## 2023-12-13 LAB — PROTIME-INR
INR: 1.4 — ABNORMAL HIGH (ref 0.8–1.2)
Prothrombin Time: 17.7 s — ABNORMAL HIGH (ref 11.4–15.2)

## 2023-12-13 MED ORDER — THIAMINE HCL 100 MG/ML IJ SOLN
100.0000 mg | Freq: Every day | INTRAMUSCULAR | Status: DC
  Filled 2023-12-13: qty 2

## 2023-12-13 MED ORDER — SODIUM CHLORIDE 0.9 % IV SOLN
2.0000 g | Freq: Once | INTRAVENOUS | Status: AC
  Administered 2023-12-13: 2 g via INTRAVENOUS
  Filled 2023-12-13 (×2): qty 12.5

## 2023-12-13 MED ORDER — SODIUM CHLORIDE 0.9% IV SOLUTION
Freq: Once | INTRAVENOUS | Status: DC
Start: 2023-12-13 — End: 2023-12-17

## 2023-12-13 MED ORDER — POTASSIUM CHLORIDE CRYS ER 20 MEQ PO TBCR
40.0000 meq | EXTENDED_RELEASE_TABLET | Freq: Once | ORAL | Status: AC
  Administered 2023-12-13: 40 meq via ORAL
  Filled 2023-12-13: qty 2

## 2023-12-13 MED ORDER — LORAZEPAM 2 MG/ML IJ SOLN: 1.0000 mg | INTRAMUSCULAR | Status: AC | PRN

## 2023-12-13 MED ORDER — MORPHINE SULFATE (PF) 4 MG/ML IV SOLN
4.0000 mg | INTRAVENOUS | Status: DC | PRN
  Administered 2023-12-13: 4 mg via INTRAVENOUS
  Filled 2023-12-13: qty 1

## 2023-12-13 MED ORDER — FOLIC ACID 1 MG PO TABS
1.0000 mg | ORAL_TABLET | Freq: Every day | ORAL | Status: DC
  Administered 2023-12-13 – 2023-12-22 (×9): 1 mg via ORAL
  Filled 2023-12-13 (×9): qty 1

## 2023-12-13 MED ORDER — HYDROMORPHONE HCL 1 MG/ML IJ SOLN
0.5000 mg | INTRAMUSCULAR | Status: DC | PRN
  Administered 2023-12-13 – 2023-12-14 (×3): 0.5 mg via INTRAVENOUS
  Filled 2023-12-13: qty 1
  Filled 2023-12-13 (×2): qty 0.5

## 2023-12-13 MED ORDER — VANCOMYCIN HCL 2000 MG/400ML IV SOLN
2000.0000 mg | Freq: Once | INTRAVENOUS | Status: AC
  Administered 2023-12-13: 2000 mg via INTRAVENOUS
  Filled 2023-12-13 (×2): qty 400

## 2023-12-13 MED ORDER — LORAZEPAM 1 MG PO TABS
1.0000 mg | ORAL_TABLET | ORAL | Status: AC | PRN
  Administered 2023-12-14 – 2023-12-15 (×2): 1 mg via ORAL
  Administered 2023-12-15: 2 mg via ORAL
  Filled 2023-12-13: qty 2
  Filled 2023-12-13 (×2): qty 1

## 2023-12-13 MED ORDER — SODIUM CHLORIDE 0.9 % IV SOLN: INTRAVENOUS | Status: AC

## 2023-12-13 MED ORDER — SODIUM CHLORIDE 0.9 % IV SOLN
2.0000 g | Freq: Three times a day (TID) | INTRAVENOUS | Status: DC
  Administered 2023-12-13 – 2023-12-19 (×18): 2 g via INTRAVENOUS
  Filled 2023-12-13 (×19): qty 12.5

## 2023-12-13 MED ORDER — ONDANSETRON HCL 4 MG PO TABS
4.0000 mg | ORAL_TABLET | Freq: Four times a day (QID) | ORAL | Status: DC | PRN
Start: 2023-12-13 — End: 2023-12-22
  Administered 2023-12-19: 4 mg via ORAL
  Filled 2023-12-13: qty 1

## 2023-12-13 MED ORDER — ONDANSETRON HCL 4 MG/2ML IJ SOLN
4.0000 mg | Freq: Four times a day (QID) | INTRAMUSCULAR | Status: DC | PRN
Start: 2023-12-13 — End: 2023-12-22

## 2023-12-13 MED ORDER — ADULT MULTIVITAMIN W/MINERALS CH
1.0000 | ORAL_TABLET | Freq: Every day | ORAL | Status: DC
  Administered 2023-12-13 – 2023-12-22 (×9): 1 via ORAL
  Filled 2023-12-13 (×9): qty 1

## 2023-12-13 MED ORDER — MORPHINE SULFATE (PF) 4 MG/ML IV SOLN
4.0000 mg | Freq: Once | INTRAVENOUS | Status: AC
  Administered 2023-12-13: 4 mg via INTRAVENOUS
  Filled 2023-12-13: qty 1

## 2023-12-13 MED ORDER — VANCOMYCIN HCL 1750 MG/350ML IV SOLN
1750.0000 mg | Freq: Two times a day (BID) | INTRAVENOUS | Status: DC
  Administered 2023-12-14 – 2023-12-15 (×3): 1750 mg via INTRAVENOUS
  Filled 2023-12-13 (×4): qty 350

## 2023-12-13 MED ORDER — THIAMINE MONONITRATE 100 MG PO TABS
100.0000 mg | ORAL_TABLET | Freq: Every day | ORAL | Status: DC
  Administered 2023-12-13 – 2023-12-22 (×9): 100 mg via ORAL
  Filled 2023-12-13 (×9): qty 1

## 2023-12-13 MED ORDER — FENTANYL CITRATE PF 50 MCG/ML IJ SOSY
50.0000 ug | PREFILLED_SYRINGE | Freq: Once | INTRAMUSCULAR | Status: AC
  Administered 2023-12-13: 50 ug via INTRAVENOUS
  Filled 2023-12-13: qty 1

## 2023-12-13 MED ORDER — METRONIDAZOLE 500 MG/100ML IV SOLN
500.0000 mg | Freq: Two times a day (BID) | INTRAVENOUS | Status: DC
  Administered 2023-12-13 – 2023-12-19 (×12): 500 mg via INTRAVENOUS
  Filled 2023-12-13 (×13): qty 100

## 2023-12-13 NOTE — Assessment & Plan Note (Signed)
 PPI ?

## 2023-12-13 NOTE — Consult Note (Signed)
 PHARMACY CONSULT NOTE - ELECTROLYTES  Pharmacy Consult for Electrolyte Monitoring and Replacement   Recent Labs: Height: 5\' 8"  (172.7 cm) Weight: 97.5 kg (215 lb) IBW/kg (Calculated) : 68.4 Estimated Creatinine Clearance: 138.9 mL/min (A) (by C-G formula based on SCr of 0.58 mg/dL (L)). Potassium (mmol/L)  Date Value  12/13/2023 3.3 (L)  02/12/2013 4.7   Magnesium (mg/dL)  Date Value  16/06/9603 1.6 (L)  02/11/2013 2.0   Calcium (mg/dL)  Date Value  54/05/8118 9.3   Calcium, Total (mg/dL)  Date Value  14/78/2956 8.3 (L)   Albumin (g/dL)  Date Value  21/30/8657 3.2 (L)  11/24/2017 2.9 (L)  02/11/2013 2.3 (L)   Phosphorus (mg/dL)  Date Value  84/69/6295 2.2 (L)   Sodium (mmol/L)  Date Value  12/13/2023 136  11/24/2017 144  02/12/2013 138    Assessment  Derrick Blanchard is a 43 y.o. male presenting with infected left foot wound. PMH significant for HCV, neuropathy, chronic left foot wound, and cirrhosis. Pharmacy has been consulted to monitor and replace electrolytes.  Diet: heart healthy MIVF: NS @ 75 mL/hr Pertinent medications: N/A  Goal of Therapy: Electrolytes WNL  Plan:  K = 3.3, give Kcl 40 mEq po x 1 Check BMP, Mg, Phos with AM labs  Thank you for allowing pharmacy to be a part of this patient's care.  Barrie Folk, PharmD Clinical Pharmacist 12/13/2023 1:31 PM

## 2023-12-13 NOTE — ED Triage Notes (Signed)
 Pt being seen by Dr Larina Bras for wound to L foot. Pt has been on 2 antibiotics with no improvement. Pt sent over by Dr Larina Bras for IV antibiotics.

## 2023-12-13 NOTE — Assessment & Plan Note (Signed)
 Multiple cell lines decreased in setting of cirrhotic disease and ETOH abuse  Pending perioperative plt transfusing  Trend cell lines Monitor

## 2023-12-13 NOTE — Consult Note (Signed)
 PODIATRY CONSULTATION  NAME Derrick Blanchard MRN [id number] DOB Sep 16, 1980 DOA 12/13/2023   Reason for consult:  Chief Complaint  Patient presents with   Wound Infection    Attending/Consulting physician: S. Alvester Morin MD  History of present illness: "Derrick Blanchard is a 43 y.o. male with history of cirrhosis presenting today for wound infection.  Patient has had a wound to the base of his left foot for a while now.  It has been worsening over the past couple months.  He has been on 2 different antibiotics outpatient with continued worsening swelling and pain to the site.  They have now noted more warmth and he is starting to have fevers and chills at home.  He was sent in for likely admission for IV antibiotics and podiatry evaluation.  He has had multiple amputations to bilateral feet including right midfoot and left second toe.  He has previously been on Augmentin and Bactrim. "  Patient states he has had the wound for a long time and has not been improving and recently getting worse.  Has noticed increasing redness and swelling of the first MPJ area.  Does have pain in the area he denies a history of diabetes.  Thinks it started when he did a lot of walking after his car broke down while he was in [place].  Has a history of prior right lower extremity TMA from 2023 that was done at [facility].  Past Medical History:  Diagnosis Date   Anemia    Anxiety    Cirrhosis (HCC)    Depression    Heart murmur    Hepatitis    Hypertension    Substance abuse (HCC)        Latest Ref Rng & Units 12/13/2023    9:24 AM 10/08/2023    2:13 AM 03/15/2023    3:56 AM  CBC  WBC 4.0 - 10.5 K/uL 1.6  4.8  3.6   Hemoglobin 13.0 - 17.0 g/dL 52.8  41.3  24.4   Hematocrit 39.0 - 52.0 % 33.4  38.7  34.8   Platelets 150 - 400 K/uL 39  58  45        Latest Ref Rng & Units 12/13/2023    9:24 AM 10/08/2023    2:13 AM 03/16/2023    5:22 AM  BMP  Glucose 70 - 99 mg/dL 99  91  010   BUN 6 - 20 mg/dL 12  8  8     Creatinine 0.61 - 1.24 mg/dL 2.72  5.36  6.44   Sodium 135 - 145 mmol/L 136  143  136   Potassium 3.5 - 5.1 mmol/L 3.3  3.2  4.2   Chloride 98 - 111 mmol/L 108  112  105   CO2 22 - 32 mmol/L 24  22  24    Calcium 8.9 - 10.3 mg/dL 9.3  8.7  9.1       Physical Exam: Lower Extremity Exam Left foot with : ulceration circular nature plantar lateral aspect first MPJ overlying the fibular sesamoid with serosanguineous drainage significant erythema and edema of the plantar first MPJ.  Tender to palpation plantarly Sensation intact to light touch DP and PT pulses 2+ Prior left second toe amputation  Prior right foot transmetatarsal amputation    ASSESSMENT/PLAN OF CARE 43 y.o. 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  Mild marrow edema of the lateral hallux sesamoid could reflect sesamoiditis or reactive change though cannot rule out osteomyelitis.  Small first MPJ effusion may be reactive versus septic arthritis  -N.p.o. past midnight for OR tomorrow.  Plan for left first MPJ ulceration irrigation debridement with bone biopsy possible fibular sesamoidectomy and graft application.  Patient aware and agrees to proceed. - Continue IV abx broad spectrum pending further culture data -if bone biopsies are positive for osteomyelitis he may need long-term IV antibiotic therapy - Anticoagulation: Hold pending OR tomorrow - Wound care: Betadine gauze dressing applied by nurse leave intact until OR - WB status: Weightbearing as tolerated preoperatively likely to change to nonweightbearing postop - Will continue to follow   Thank you for the consult.  Please contact me directly with any questions or concerns.           Corinna Gab, DPM [facility]    [address] [place], [place] [address]                Office [phone]  Fax [phone]

## 2023-12-13 NOTE — Assessment & Plan Note (Signed)
 INR pending in setting of alcoholic cirrhosis

## 2023-12-13 NOTE — Progress Notes (Signed)
 ED Pharmacy Antibiotic Sign Off An antibiotic consult was received from an ED provider for cefepime and vancomycin per pharmacy dosing for osteomyelitis. A chart review was completed to assess appropriateness.   The following one time order(s) were placed:  Cefepime 2 g IV x 1 Vancomycin 2 g IV x 1  Further antibiotic and/or antibiotic pharmacy consults should be ordered by the admitting provider if indicated.   Thank you for allowing pharmacy to be a part of this patient's care.   Merryl Hacker, [facility]  Clinical Pharmacist 12/13/23 11:17 AM

## 2023-12-13 NOTE — Assessment & Plan Note (Signed)
 Baseline alcoholic cirrhosis  LFTs, INR  pending

## 2023-12-13 NOTE — Assessment & Plan Note (Signed)
 Regular ETOH abuse (15pk vs. Multiple 40 oz beer daily drinker)  CIWA protocol  Vitamin supplementation  Monitor

## 2023-12-13 NOTE — H&P (Addendum)
 History and Physical    Patient: Derrick Blanchard ZOX:[id number] DOB: 1980-11-28 DOA: 12/13/2023 DOS: the patient was seen and examined on 12/13/2023 PCP: Pcp, No  Patient coming from: Home  Chief Complaint:  Chief Complaint  Patient presents with   Wound Infection   HPI: Derrick Blanchard is a 43 y.o. male with medical history significant of cirrhosis, depression, alcohol abuse, hypertension, substance abuse presenting with left lower extremity wound.  Patient reports history of prior lower extremity wounds that have been managed as well as amputated in the [facility] system.  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  Has been followed by outpatient wound care for right great toe wound on the plantar aspect.  Has had multiple courses of antibiotics with minimal improvement in symptoms.  Noted worsening pain and intermittent purulent drainage.  Baseline alcoholic.  Drinks roughly 15 pack every day versus multiple 40 ounces.  No reported illicit drug use.  Denies any known history of type 2 diabetes.  No abdominal pain.  No nausea or vomiting.  Pain has become unbearable.  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 1/4 PPD smoker.  Presented to the ER afebrile, hemodynamically stable.  Satting well on room air.  White count 1.6, hemoglobin 10.4, platelets 39, creatinine 0.58.  LFTs as well as INR pending.  Left foot plain films with 1 x 1.9 cm lesion lucency over plantar aspect of the second head and metatarsal region with associated soft tissue swelling. Review of Systems: As mentioned in the history of present illness. All other systems reviewed and are negative. Past Medical History:  Diagnosis Date   Anemia    Anxiety    Cirrhosis (HCC)    Depression    Heart murmur    Hepatitis    Hypertension    Substance abuse (HCC)    Past Surgical History:  Procedure Laterality Date   COLONOSCOPY     ESOPHAGOGASTRODUODENOSCOPY (EGD) WITH PROPOFOL N/A 02/03/2015   Procedure:  ESOPHAGOGASTRODUODENOSCOPY (EGD) WITH PROPOFOL;  Surgeon: Elnita Maxwell, MD;  Location: [facility] ENDOSCOPY;  Service: Endoscopy;  Laterality: N/A;   ESOPHAGOGASTRODUODENOSCOPY (EGD) WITH PROPOFOL N/A 12/21/2017   Procedure: ESOPHAGOGASTRODUODENOSCOPY (EGD) WITH PROPOFOL;  Surgeon: Wyline Mood, MD;  Location: [facility] ENDOSCOPY;  Service: Gastroenterology;  Laterality: N/A;   FISSURECTOMY     Social History:  reports that he has been smoking cigarettes. He has never used smokeless tobacco. He reports that he does not currently use alcohol. He reports current drug use. Drug: Marijuana.  No Known Allergies  Family History  Problem Relation Age of Onset   Lung cancer Mother    Cirrhosis Father    Lung cancer Paternal Grandfather     Prior to Admission medications   Medication Sig Start Date End Date Taking? Authorizing Provider  doxycycline (VIBRAMYCIN) 100 MG capsule Take 100 mg by mouth 2 (two) times daily. 11/23/23  Yes [provider]  HYDROcodone-acetaminophen (NORCO) 10-325 MG tablet Take 1 tablet by mouth every 6 (six) hours as needed. 10/16/23  Yes [provider]  amoxicillin-clavulanate (AUGMENTIN) 875-125 MG tablet Take 1 tablet by mouth 2 (two) times daily. 11/30/23   Maxwell Caul, MD  folic acid (FOLVITE) 1 MG tablet Take 1 tablet (1 mg total) by mouth daily. 03/17/23   Marcelino Duster, MD  magnesium oxide (MAG-OX) 400 (240 Mg) MG tablet Take 1 tablet (400 mg total) by mouth 2 (two) times daily. 03/16/23   Marcelino Duster, MD  Multiple Vitamin (MULTIVITAMIN WITH MINERALS) TABS tablet Take 1 tablet by mouth daily. 03/17/23   Marcelino Duster, MD  oxyCODONE (OXY IR/ROXICODONE) 5 MG immediate release tablet Take 1 tablet (5 mg total) by mouth every 6 (six) hours as needed for moderate pain. 03/16/23   Marcelino Duster, MD  oxyCODONE-acetaminophen (PERCOCET) 5-325 MG tablet Take 1 tablet by mouth every 6 (six) hours as needed for severe pain (pain  score 7-10). 10/08/23   Loleta Rose, MD  pantoprazole (PROTONIX) 40 MG tablet Take 1 tablet (40 mg total) by mouth daily. 03/17/23   Marcelino Duster, MD  thiamine (VITAMIN B-1) 100 MG tablet Take 1 tablet (100 mg total) by mouth daily. 03/17/23   Marcelino Duster, MD    Physical Exam: Vitals:   12/13/23 0847 12/13/23 0851 12/13/23 1000 12/13/23 1220  BP: 125/74  119/69 121/60  Pulse: 65  69 68  Resp: 19   18  Temp: 97.7 F (36.5 C)   97.9 F (36.6 C)  TempSrc: Oral   Oral  SpO2: 100%  100% 100%  Weight:  97.5 kg    Height:  5\' 8"  (1.727 m)     Physical Exam Constitutional:      Appearance: He is obese.  HENT:     Head: Normocephalic and atraumatic.     Nose: Nose normal.     Mouth/Throat:     Mouth: Mucous membranes are moist.  Eyes:     Pupils: Pupils are equal, round, and reactive to light.  Cardiovascular:     Rate and Rhythm: Normal rate.  Pulmonary:     Effort: Pulmonary effort is normal.  Abdominal:     General: Bowel sounds are normal.  Musculoskeletal:        General: Normal range of motion.     Cervical back: Normal range of motion.  Skin:    General: Skin is warm.  Neurological:     General: No focal deficit present.  Psychiatric:        Mood and Affect: Mood normal.     Data Reviewed:  There are no new results to review at this time.  MR FOOT LEFT WO CONTRAST CLINICAL DATA:  Left plantar foot wound.  Concern for osteomyelitis.  EXAM: MRI OF THE LEFT FOOT WITHOUT CONTRAST  TECHNIQUE: Multiplanar, multisequence MR imaging of the left foot was performed. No intravenous contrast was administered.  COMPARISON:  left foot radiographs dated 12/13/2023. MR left foot dated 03/11/2023.  FINDINGS: Bones/Joint/Cartilage  No marrow signal abnormality identified to suggest osteomyelitis. 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 No acute fracture or dislocation. Mild edema of the lateral hallux sesamoid without associated T1  marrow signal abnormality. Small first MTP joint effusion. Mild degenerative changes of the first MTP joint.  Ligaments  Collateral ligaments are intact.  Lisfranc ligament is intact.  Muscles and Tendons  Flexor and extensor compartments are intact. No significant tenosynovitis. Diffuse atrophy of the intrinsic musculature of the foot, likely reflecting chronic denervation changes.  Soft tissue Plantar soft tissue ulceration underlying the first MTP joint. There is surrounding soft tissue edema and ill-defined fluid. No discrete loculated collection.  IMPRESSION: 1. Plantar soft tissue ulceration underlying the first MTP joint. Surrounding soft tissue swelling with ill-defined fluid is concerning for cellulitis with phlegmonous changes. No loculated collection identified. 2. Mild marrow edema of the lateral hallux sesamoid, similar to the prior exam. This could reflect sesamoiditis or reactive marrow change. Early osteomyelitis can not be entirely excluded.  3. Small first MTP joint effusion may be reactive, however, septic arthritis can not be excluded. 4. 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 5. Mild degenerative changes of the midfoot and first MTP joint.  Electronically Signed   By: Hart Robinsons M.D.   On: 12/13/2023 12:27 DG Foot Complete Left CLINICAL DATA:  Left foot ulcer to the plantar distal first metatarsal, evaluating for possible bone involvement  EXAM: LEFT FOOT - COMPLETE 3+ VIEW  COMPARISON:  10/08/2023.  FINDINGS: There is diffuse osteopenia of the visualized osseous structures.  No acute fracture or dislocation.  No aggressive osseous lesion.  Note is again made of amputation of second toe at the level of metatarsophalangeal joint.  Mild diffuse degenerative changes of imaged bones.  There is an approximately 1.0 x 1.9 cm lucency overlying the plantar aspect of the head of second metatarsal region with  associated surrounding soft tissue swelling. However, ulcer does not reach up to the bone surface. Underlying bone is intact. No focal bone erosions.  No radiopaque foreign bodies.  IMPRESSION: *There is an approximately 1.0 x 1.9 cm lucency overlying the plantar aspect of the head of second metatarsal region with associated surrounding soft tissue swelling. However, ulcer does not reach up to the bone surface. Underlying bone is intact. No focal bone erosions. No radiographic evidence of osteomyelitis.  Electronically Signed   By: Jules Schick M.D.   On: 12/13/2023 09:53  Lab Results  Component Value Date   WBC 1.6 (L) 12/13/2023   HGB 10.4 (L) 12/13/2023   HCT 33.4 (L) 12/13/2023   MCV 85.0 12/13/2023   PLT 39 (L) 12/13/2023   Last metabolic panel Lab Results  Component Value Date   GLUCOSE 99 12/13/2023   NA 136 12/13/2023   K 3.3 (L) 12/13/2023   CL 108 12/13/2023   CO2 24 12/13/2023   BUN 12 12/13/2023   CREATININE 0.58 (L) 12/13/2023   GFRNONAA >60 12/13/2023   CALCIUM 9.3 12/13/2023   PHOS 2.2 (L) 09/26/2017   PROT 6.1 (L) 12/13/2023   ALBUMIN 3.2 (L) 12/13/2023   LABGLOB 3.3 11/24/2017   AGRATIO 0.9 (L) 11/24/2017   BILITOT 3.1 (H) 12/13/2023   ALKPHOS 97 12/13/2023   AST 54 (H) 12/13/2023   ALT 27 12/13/2023   ANIONGAP 4 (L) 12/13/2023    Assessment and Plan: Coagulopathy (HCC) INR pending in setting of alcoholic cirrhosis    Open wound of left lower extremity Worsening LLE wound despite outpatient antibiotic regimen x 2- sent over from wound care clinic Active ETOH abuse and cirrhotic disease are confounding issues MRI foot pending to better assess  Will place on IV cefepime, flagyl and vancomycin for infectious coverage  ESR, CRP  Panculture  Follow up podiatry recommendations    Alcoholic cirrhosis of liver (HCC) Baseline alcoholic cirrhosis  LFTs, INR  pending    Pancytopenia (HCC) Multiple cell lines decreased in setting of  cirrhotic disease and ETOH abuse  Pending perioperative plt transfusing  Trend cell lines Monitor    ETOH abuse Regular ETOH abuse (15pk vs. Multiple 40 oz beer daily drinker)  CIWA protocol  Vitamin supplementation  Monitor   GERD (gastroesophageal reflux disease) PPI    Thrombocytopenia (HCC) Plt in count in 30s- chronic in setting of cirrhotic disease  Monitor        Advance Care Planning:   Code Status: Full Code   Consults: Podiatry   Family Communication: No family at the bedside  Severity of Illness: The appropriate patient status for this patient is OBSERVATION. Observation status is judged to be reasonable and necessary in order to provide the required intensity of service to ensure the patient's safety. The patient's presenting symptoms, physical exam findings, and initial radiographic and laboratory data in the context of their medical condition is felt to place them at decreased risk for further clinical deterioration. Furthermore, it is anticipated that the patient will be medically stable for discharge from the hospital within 2 midnights of admission.   Author: Floydene Flock, MD 12/13/2023 1:13 PM  For on call review [email or web address].

## 2023-12-13 NOTE — Assessment & Plan Note (Signed)
 Plt in count in 30s- chronic in setting of cirrhotic disease  Monitor

## 2023-12-13 NOTE — ED Triage Notes (Signed)
 First Nurse Note: Patient to ED sent by MD. Rhina Brackett for a foot ulcer. Currently on antibiotics for same but wound is worsening. Concerned for osteomyelitis.

## 2023-12-13 NOTE — Consult Note (Signed)
 Pharmacy Antibiotic Note  Derrick Blanchard is a 43 y.o. male admitted on 12/13/2023 with left foot wound infection.  Patient sent to ED from wound healing center.  Patient on oral antibiotics prior to admission, based on fill history likely Augmentin and doxycycline. Pharmacy has been consulted for vancomycin and cefepime dosing.  Afebrile, WBC = 1.6  Plan: Give vancomycin 2000 mg IV x 1, followed by 1750 mg IV every 12 hours Estimated AUC 484, Cmin 12.6 IBW, Scr rounded to 0.8, Vd 0.72 Vancomycin levels at steady state or as clinically indicated Start cefepime 2 grams IV every 8 hours Flagyl 500 mg IV every 12 hours per provider Follow renal function and cultures for adjustments  Height: 5\' 8"  (172.7 cm) Weight: 97.5 kg (215 lb) IBW/kg (Calculated) : 68.4  Temp (24hrs), Avg:97.8 F (36.6 C), Min:97.7 F (36.5 C), Max:97.9 F (36.6 C)  Recent Labs  Lab 12/13/23 0924  WBC 1.6*  CREATININE 0.58*    Estimated Creatinine Clearance: 138.9 mL/min (A) (by C-G formula based on SCr of 0.58 mg/dL (L)).    No Known Allergies  Antimicrobials this admission: Cefepime 4/9 >>  Vancomycin 4/9 >> Flagyl 4/9>>   Microbiology results: N/A  Thank you for allowing pharmacy to be a part of this patient's care.  Barrie Folk, PharmD 12/13/2023 12:42 PM

## 2023-12-13 NOTE — Assessment & Plan Note (Signed)
 Worsening LLE wound despite outpatient antibiotic regimen x 2- sent over from wound care clinic Active ETOH abuse and cirrhotic disease are confounding issues MRI foot pending to better assess  Will place on IV cefepime, flagyl and vancomycin for infectious coverage  ESR, CRP  Panculture  Follow up podiatry recommendations

## 2023-12-13 NOTE — ED Provider Notes (Signed)
 [facility] Provider Note    Event Date/Time   First MD Initiated Contact with Patient 12/13/23 [phone]     (approximate)   History   Wound Infection   HPI ALTA SHOBER is a 43 y.o. male with history of cirrhosis presenting today for wound infection.  Patient has had a wound to the base of his left foot for a while now.  It has been worsening over the past couple months.  He has been on 2 different antibiotics outpatient with continued worsening swelling and pain to the site.  They have now noted more warmth and he is starting to have fevers and chills at home.  He was sent in for likely admission for IV antibiotics and podiatry evaluation.  He has had multiple amputations to bilateral feet including right midfoot and left second toe.  He has previously been on Augmentin and Bactrim.  Chart review: Patient has been seeing wound care over the past 2 months.     Physical Exam   Triage Vital Signs: ED Triage Vitals  Encounter Vitals Group     BP 12/13/23 0847 125/74     Systolic BP Percentile --      Diastolic BP Percentile --      Pulse Rate 12/13/23 0847 65     Resp 12/13/23 0847 19     Temp 12/13/23 0847 97.7 F (36.5 C)     Temp Source 12/13/23 0847 Oral     SpO2 12/13/23 0847 100 %     Weight 12/13/23 0851 215 lb (97.5 kg)     Height 12/13/23 0851 5\' 8"  (1.727 m)     Head Circumference --      Peak Flow --      Pain Score 12/13/23 0851 7     Pain Loc --      Pain Education --      Exclude from Growth Chart --     Most recent vital signs: Vitals:   12/13/23 0847 12/13/23 1000  BP: 125/74 119/69  Pulse: 65 69  Resp: 19   Temp: 97.7 F (36.5 C)   SpO2: 100% 100%   I have reviewed the vital signs. General:  Awake, alert, no acute distress. Head:  Normocephalic, Atraumatic. EENT:  PERRL, EOMI, Oral mucosa pink and moist, Neck is supple. Cardiovascular: Regular rate, 2+ distal pulses. Respiratory:  Normal respiratory effort,  symmetrical expansion, no distress.   Extremities:  Moving all four extremities through full ROM without pain.   Neuro:  Alert and oriented.  Interacting appropriately.   Skin: Ulcer to the plantar surface of the left foot near the distal first metatarsal.  Swelling around the area with warmth noted throughout the foot. Psych: Appropriate affect.    ED Results / Procedures / Treatments   Labs (all labs ordered are listed, but only abnormal results are displayed) Labs Reviewed  CBC WITH DIFFERENTIAL/PLATELET - Abnormal; Notable for the following components:      Result Value   WBC 1.6 (*)    RBC 3.93 (*)    Hemoglobin 10.4 (*)    HCT 33.4 (*)    RDW 18.3 (*)    Platelets 39 (*)    Neutro Abs 0.8 (*)    Lymphs Abs 0.4 (*)    All other components within normal limits  BASIC METABOLIC PANEL WITH GFR - Abnormal; Notable for the following components:   Potassium 3.3 (*)    Creatinine, Ser 0.58 (*)  Anion gap 4 (*)    All other components within normal limits  PATHOLOGIST SMEAR REVIEW  SEDIMENTATION RATE  C-REACTIVE PROTEIN     EKG    RADIOLOGY Independently interpreted x-ray with no obvious signs of osteomyelitis   PROCEDURES:  Critical Care performed: No  Procedures   MEDICATIONS ORDERED IN ED: Medications  ceFEPIme (MAXIPIME) 2 g in sodium chloride 0.9 % 100 mL IVPB (has no administration in time range)  vancomycin (VANCOREADY) IVPB 2000 mg/400 mL (has no administration in time range)  morphine (PF) 4 MG/ML injection 4 mg (4 mg Intravenous Given 12/13/23 0929)  fentaNYL (SUBLIMAZE) injection 50 mcg (50 mcg Intravenous Given 12/13/23 1032)     IMPRESSION / MDM / ASSESSMENT AND PLAN / ED COURSE  I reviewed the triage vital signs and the nursing notes.                              Differential diagnosis includes, but is not limited to, ulcer, osteomyelitis, cellulitis  Patient's presentation is most consistent with acute presentation with potential threat to  life or bodily function.  Patient is a 43 year old male presenting today for worsening foot ulcer to his left foot.  He has been on multiple outpatient antibiotics with continued worsening symptoms.  He is at the point where he likely needs IV antibiotics and podiatry consultation.  Patient started on cefepime and vancomycin.  CBC with leukopenia and thrombocytopenia similar to prior baseline.  BMP otherwise unremarkable.  X-ray shows no obvious osteomyelitis.  Discussed case with podiatry who do recommend continuation of IV antibiotics and MRI for further evaluation.  Patient mid to hospitalist for further care.  The patient is on the cardiac monitor to evaluate for evidence of arrhythmia and/or significant heart rate changes. Clinical Course as of 12/13/23 1146  Wed Dec 13, 2023  1038 Platelets(!): 39 Chronic thrombocytopenia [DW]  1125 Podiatry agrees with IV antibiotics and recommends MRI for further evaluation [DW]    Clinical Course User Index [DW] Janith Lima, MD     FINAL CLINICAL IMPRESSION(S) / ED DIAGNOSES   Final diagnoses:  Ulcer of left foot, unspecified ulcer stage (HCC)     Rx / DC Orders   ED Discharge Orders     None        Note:  This document was prepared using Dragon voice recognition software and may include unintentional dictation errors.   Janith Lima, MD 12/13/23 1147

## 2023-12-14 ENCOUNTER — Encounter
Admission: EM | Disposition: A | Discharge status: Home Health | Payer: Self-pay | Source: Home / Self Care | Attending: Internal Medicine

## 2023-12-14 ENCOUNTER — Encounter: Payer: Self-pay | Admitting: Certified Registered"

## 2023-12-14 DIAGNOSIS — Z801 Family history of malignant neoplasm of trachea, bronchus and lung: Secondary | ICD-10-CM | POA: Diagnosis not present

## 2023-12-14 DIAGNOSIS — Z79899 Other long term (current) drug therapy: Secondary | ICD-10-CM | POA: Diagnosis not present

## 2023-12-14 DIAGNOSIS — Z89422 Acquired absence of other left toe(s): Secondary | ICD-10-CM | POA: Diagnosis not present

## 2023-12-14 DIAGNOSIS — K766 Portal hypertension: Secondary | ICD-10-CM | POA: Diagnosis present

## 2023-12-14 DIAGNOSIS — Z6832 Body mass index (BMI) 32.0-32.9, adult: Secondary | ICD-10-CM | POA: Diagnosis not present

## 2023-12-14 DIAGNOSIS — D61818 Other pancytopenia: Secondary | ICD-10-CM | POA: Diagnosis present

## 2023-12-14 DIAGNOSIS — L97529 Non-pressure chronic ulcer of other part of left foot with unspecified severity: Secondary | ICD-10-CM | POA: Diagnosis present

## 2023-12-14 DIAGNOSIS — K7030 Alcoholic cirrhosis of liver without ascites: Secondary | ICD-10-CM | POA: Diagnosis present

## 2023-12-14 DIAGNOSIS — F1010 Alcohol abuse, uncomplicated: Secondary | ICD-10-CM | POA: Diagnosis present

## 2023-12-14 DIAGNOSIS — Z751 Person awaiting admission to adequate facility elsewhere: Secondary | ICD-10-CM | POA: Diagnosis not present

## 2023-12-14 DIAGNOSIS — E66811 Obesity, class 1: Secondary | ICD-10-CM | POA: Diagnosis present

## 2023-12-14 DIAGNOSIS — S91302A Unspecified open wound, left foot, initial encounter: Secondary | ICD-10-CM | POA: Diagnosis not present

## 2023-12-14 DIAGNOSIS — F17210 Nicotine dependence, cigarettes, uncomplicated: Secondary | ICD-10-CM | POA: Diagnosis present

## 2023-12-14 DIAGNOSIS — D689 Coagulation defect, unspecified: Secondary | ICD-10-CM | POA: Diagnosis present

## 2023-12-14 DIAGNOSIS — Z5309 Procedure and treatment not carried out because of other contraindication: Secondary | ICD-10-CM | POA: Diagnosis present

## 2023-12-14 DIAGNOSIS — L089 Local infection of the skin and subcutaneous tissue, unspecified: Secondary | ICD-10-CM | POA: Diagnosis present

## 2023-12-14 DIAGNOSIS — I10 Essential (primary) hypertension: Secondary | ICD-10-CM | POA: Diagnosis present

## 2023-12-14 DIAGNOSIS — K219 Gastro-esophageal reflux disease without esophagitis: Secondary | ICD-10-CM | POA: Diagnosis present

## 2023-12-14 LAB — CBC
HCT: 31.1 % — ABNORMAL LOW (ref 39.0–52.0)
HCT: 29.7 % — ABNORMAL LOW (ref 39.0–52.0)
Hemoglobin: 9.9 g/dL — ABNORMAL LOW (ref 13.0–17.0)
Hemoglobin: 9.5 g/dL — ABNORMAL LOW (ref 13.0–17.0)
MCH: 26.3 pg (ref 26.0–34.0)
MCH: 26.6 pg (ref 26.0–34.0)
MCHC: 31.8 g/dL (ref 30.0–36.0)
MCHC: 32 g/dL (ref 30.0–36.0)
MCV: 82.7 fL (ref 80.0–100.0)
MCV: 83.2 fL (ref 80.0–100.0)
Platelets: 37 10*3/uL — ABNORMAL LOW (ref 150–400)
Platelets: 40 10*3/uL — ABNORMAL LOW (ref 150–400)
RBC: 3.76 MIL/uL — ABNORMAL LOW (ref 4.22–5.81)
RBC: 3.57 MIL/uL — ABNORMAL LOW (ref 4.22–5.81)
RDW: 18.2 % — ABNORMAL HIGH (ref 11.5–15.5)
RDW: 18.2 % — ABNORMAL HIGH (ref 11.5–15.5)
WBC: 1.7 10*3/uL — ABNORMAL LOW (ref 4.0–10.5)
WBC: 1.6 10*3/uL — ABNORMAL LOW (ref 4.0–10.5)
nRBC: 0 % (ref 0.0–0.2)
nRBC: 0 % (ref 0.0–0.2)

## 2023-12-14 LAB — COMPREHENSIVE METABOLIC PANEL WITH GFR
ALT: 23 U/L (ref 0–44)
AST: 45 U/L — ABNORMAL HIGH (ref 15–41)
Albumin: 2.8 g/dL — ABNORMAL LOW (ref 3.5–5.0)
Alkaline Phosphatase: 82 U/L (ref 38–126)
Anion gap: 6 (ref 5–15)
BUN: 12 mg/dL (ref 6–20)
CO2: 22 mmol/L (ref 22–32)
Calcium: 8.9 mg/dL (ref 8.9–10.3)
Chloride: 107 mmol/L (ref 98–111)
Creatinine, Ser: 0.57 mg/dL — ABNORMAL LOW (ref 0.61–1.24)
GFR, Estimated: >60 mL/min (ref >60)
Glucose, Bld: 89 mg/dL (ref 70–99)
Potassium: 3.8 mmol/L (ref 3.5–5.1)
Sodium: 135 mmol/L (ref 135–145)
Total Bilirubin: 3.1 mg/dL — ABNORMAL HIGH (ref 0.0–1.2)
Total Protein: 5.5 g/dL — ABNORMAL LOW (ref 6.5–8.1)

## 2023-12-14 LAB — MAGNESIUM: Magnesium: 1.6 mg/dL — ABNORMAL LOW (ref 1.7–2.4)

## 2023-12-14 LAB — PHOSPHORUS: Phosphorus: 3.4 mg/dL (ref 2.5–4.6)

## 2023-12-14 SURGERY — IRRIGATION AND DEBRIDEMENT FOOT
Anesthesia: Choice | Laterality: Left

## 2023-12-14 MED ORDER — PROPOFOL 10 MG/ML IV BOLUS
INTRAVENOUS | Status: AC
  Filled 2023-12-14: qty 20

## 2023-12-14 MED ORDER — MIDAZOLAM HCL 2 MG/2ML IJ SOLN
INTRAMUSCULAR | Status: AC
  Filled 2023-12-14: qty 2

## 2023-12-14 MED ORDER — OXYCODONE HCL 5 MG PO TABS
5.0000 mg | ORAL_TABLET | ORAL | Status: DC | PRN
  Administered 2023-12-14 – 2023-12-15 (×5): 5 mg via ORAL
  Filled 2023-12-14 (×5): qty 1

## 2023-12-14 MED ORDER — FENTANYL CITRATE (PF) 100 MCG/2ML IJ SOLN
INTRAMUSCULAR | Status: AC
  Filled 2023-12-14: qty 2

## 2023-12-14 MED ORDER — MAGNESIUM SULFATE 2 GM/50ML IV SOLN
2.0000 g | Freq: Once | INTRAVENOUS | Status: AC
  Administered 2023-12-14: 2 g via INTRAVENOUS
  Filled 2023-12-14: qty 50

## 2023-12-14 NOTE — Consult Note (Signed)
 PHARMACY CONSULT NOTE - ELECTROLYTES  Pharmacy Consult for Electrolyte Monitoring and Replacement   Recent Labs: Height: 5\' 8"  (172.7 cm) Weight: 97.5 kg (215 lb) IBW/kg (Calculated) : 68.4 Estimated Creatinine Clearance: 138.9 mL/min (A) (by C-G formula based on SCr of 0.57 mg/dL (L)). Potassium (mmol/L)  Date Value  12/14/2023 3.8  02/12/2013 4.7   Magnesium (mg/dL)  Date Value  30/86/5784 1.6 (L)  02/11/2013 2.0   Calcium (mg/dL)  Date Value  69/62/9528 8.9   Calcium, Total (mg/dL)  Date Value  41/32/4401 8.3 (L)   Albumin (g/dL)  Date Value  02/72/5366 2.8 (L)  11/24/2017 2.9 (L)  02/11/2013 2.3 (L)   Phosphorus (mg/dL)  Date Value  44/11/4740 3.4   Sodium (mmol/L)  Date Value  12/14/2023 135  11/24/2017 144  02/12/2013 138    Assessment  Derrick Blanchard is a 43 y.o. male presenting with infected left foot wound. PMH significant for HCV, neuropathy, chronic left foot wound, and cirrhosis. Pharmacy has been consulted to monitor and replace electrolytes.  Diet: heart healthy MIVF: NS @ 75 mL/hr Pertinent medications: N/A  Goal of Therapy: Electrolytes WNL  Plan:  Mg = 1.6, give MagSulf 2g IV x 1 Check BMP, Mg, Phos with AM labs  Thank you for allowing pharmacy to be a part of this patient's care.  Barrie Folk, PharmD Clinical Pharmacist 12/14/2023 6:59 AM

## 2023-12-14 NOTE — Discharge Instructions (Addendum)
 Transportation Resources  Agency Name: [facility] Agency Address: [address] [place], [place] [address] Phone: [phone] Email: [email or web address] Website: [email or web address] Service(s) Offered: Housing services, self-sufficiency, congregate meal program, weatherization program, Field seismologist program, emergency food assistance,  housing counseling, home ownership program, wheels-towork program.  Agency Name: [facility] [employer] [phone]) Address: [address] [place], [place] [address] Phone: [phone] Website: [email or web address] Service(s) Offered: Transportation for [employer], subscription and demand response; Dial-a-Ride for citizens 77 years of age or older.  Agency Name: Department of Social Services Address: [address] [place], [place] [address] Phone: [phone] Service(s) Offered: Child support services; child welfare services; food stamps; Medicaid; work first family assistance; and aid with fuel,  rent, food and medicine, transportation assistance.  Agency Name: Disabled [employer] (DAV) Transportation  Network Phone: [phone] Service(s) Offered: Transports veterans to the [facility] medical center. Call  forty-eight hours in advance and leave the name, telephone  number, date, and time of appointment. Veteran will be  contacted by the driver the day before the appointment to  arrange a pick up point   Transportation Resources  Agency Name: [facility] Agency Address: [address] [place], [place] [address] Phone: [phone] Email: [email or web address] Website: [email or web address] Service(s) Offered: Housing services, self-sufficiency, congregate meal program, weatherization program, Field seismologist program, emergency food assistance,  housing counseling, home ownership program, wheels-towork  program.  Agency Name: [facility] [employer] [phone]) Address: [address], [place], [place] [address] Phone: [phone] Website: [email or web address] Service(s) Offered: Transportation for [employer], subscription and demand response; Dial-a-Ride for citizens 61 years of age or older.  Agency Name: Department of Social Services Address: [address] [place], [place] [address] Phone: [phone] Service(s) Offered: Child support services; child welfare services; food stamps; Medicaid; work first family assistance; and aid with fuel,  rent, food and medicine, transportation assistance.  Agency Name: Disabled [employer] (DAV) Transportation  Network Phone: [phone] Service(s) Offered: Transports veterans to the [facility] medical center. Call  forty-eight hours in advance and leave the name, telephone  number, date, and time of appointment. Veteran will be  contacted by the driver the day before the appointment to  arrange a pick up point    [employer] ACTA currently provides door to door services. ACTA connects with PART daily for services to [facility]. ACTA also performs contract services to [employer] operates 27 vehicles, all but 3 mini-vans are equipped with lifts for special needs as well as the general public. ACTA drivers are each CDL certified and trained in First Aid and CPR. ACTA was established in 2002 by [employer]. An independent Industrial/product designer. ACTA operates via Cytogeneticist with required Research scientist (physical sciences) from [place]. ACTA provides over 80,000 passenger trips each year, including [facility] and [employer] sites.  Call at least by 11 AM one business day prior to needing transportation  [employer].                      [place], [place] [address]     Office  Hours: Monday-Friday  8 AM - 5 PM   Intensive Outpatient Programs   [facility] [address] [place],  [place], [place] [phone]  [facility] (Inpatient and outpatient)[phone] (  Suboxone and Methadone) [address] [phone]  [facility]: [facility] Programs - Intensive Outpatient [address] [place], [place] [address], [place]  [phone]  [facility] (Outpatient, Inpatient, [facility] (Groups and Residental) (insurance only) [phone] [place], [place] [phone]   [facility] (for caregivers and family) [address] Amy Kansky [address], [place], [place] [phone]  Residential Treatment Programs  [facility](2 years) Residential: 16 days)[facility] [address] [place], [place], [place] [phone] or [phone]  [facility] [address] [place], [place], [place] [phone]  [facility] ([facility]) [address] [place], [place], [place] [phone] Admissions: 8am-3pm M-F  [facility] Program: Residential Program [phone] Days)             [facility]: [facility], [place], [place]  [phone] or [phone] in Hours over the weekend or by referral)  [facility] [address] [place], [place] [address] [phone] (Do virtual or phone assessment, offer transportation within 25 miles, have in patient and Outpatient options)   Mobil Crisis: Therapeutic  Alternatives:[phone] (for crisis response 24 hours a day)   Some PCP options in [place] area- not a comprehensive list [facility] [phone] [facility]- [phone] [facility]- [phone] [facility]- [phone] [facility]- [phone] Kerney Pee- [phone] or [facility] Health Physician Referral Line [phone]

## 2023-12-14 NOTE — Plan of Care (Signed)
 Podiatry plan of care:  Had discussed with Dr. Alvester Morin last night regarding patient's thrombocytopenia and need for preoperative platelet transfusion.  Dr. Alvester Morin recommended platelet transfusion scheduled overnight.  Unfortunately this was not completed.  Attempt was made this morning to transfuse patient and repeat CBC prior to scheduled procedure however  was not able to be completed in time. Therefore due to platelet count less than 50 with invasive procedure planned surgery will have to be rescheduled.  Will reach out and discussed with Dr. Jamse Arn who is taking over call starting later today.  He will be doing the procedure possibly tomorrow versus the weekend.  OR timing pending his schedule and availability.        Corinna Gab, DPM [facility]                   12/14/2023

## 2023-12-14 NOTE — Progress Notes (Signed)
 Progress Note   Patient: Derrick Blanchard ZOX:[id number] DOB: 1981/07/10 DOA: 12/13/2023     0 DOS: the patient was seen and examined on 12/14/2023   Brief hospital course:  KHOLTON COATE is a 43 y.o. male with medical history significant of cirrhosis, depression, alcohol abuse, hypertension, substance abuse presenting with left lower extremity wound.  Patient reports history of prior lower extremity wounds that have been managed as well as amputated in the [facility] system.  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  Has been followed by outpatient wound care for right great toe wound on the plantar aspect.  Has had multiple courses of antibiotics with minimal improvement in symptoms.  Noted worsening pain and intermittent purulent drainage.  Baseline alcoholic.  Drinks roughly 15 pack every day versus multiple 40 ounces.  No reported illicit drug use.  Denies any known history of type 2 diabetes.  No abdominal pain.  No nausea or vomiting.  Pain has become unbearable.  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 1/4 PPD smoker.  Presented to the ER afebrile, hemodynamically stable.  Satting well on room air.  White count 1.6, hemoglobin 10.4, platelets 39, creatinine 0.58.  LFTs as well as INR pending.  Left foot plain films with 1 x 1.9 cm lesion lucency over plantar aspect of the second head and metatarsal region with associated soft tissue swelling. Review of Systems: As mentioned in the history of present illness. All other systems reviewed and are negative     Assessment and Plan:  Open wound of left lower extremity Worsening LLE wound despite outpatient antibiotic regimen x 2- sent over from wound care clinic Active ETOH abuse and cirrhotic disease are confounding issues MRI foot shows plantar soft tissue ulceration underlying the first MTP joint. Surrounding soft tissue swelling with ill-defined fluid is concerning for cellulitis with phlegmonous changes. No loculated  collection identified. Mild marrow edema of the lateral hallux sesamoid, similar to the prior exam. This could reflect sesamoiditis or reactive marrow change. Early osteomyelitis can not be entirely excluded. Small first MTP joint effusion may be reactive, however, septic arthritis can not be excluded. 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 Mild degenerative changes of the midfoot and first MTP joint. Patient is on empiric antibiotic therapy with IV cefepime, flagyl and vancomycin Appreciate podiatry input, patient was scheduled for left first MPJ ulceration irrigation debridement with bone biopsy possible fibular sesamoidectomy and graft application on 04/10.  Procedure was canceled due to persistent thrombocytopenia despite platelet transfusion Podiatrist recommends to continue IV abx broad spectrum pending further culture data -if bone biopsies are positive for osteomyelitis he may need long-term IV antibiotic therapy Surgery has been rescheduled for 04/12      Alcoholic cirrhosis of liver (HCC) Portal hypertension Pancytopenia Continued alcohol abuse Monitor closely for signs and symptoms of alcohol withdrawal Continue lorazepam and administer for CIWA score of 8 or greater Continue MVI and thiamine Pancytopenia persists and patient will require platelet transfusion prior to procedure on 4/12 to keep platelet count greater than 50     GERD (gastroesophageal reflux disease) Continue PPI       Class I obesity Complicates his overall prognosis and care Lifestyle modification and exercise has been discussed with patient           Subjective: Patient is seen and examined at the bedside.  No new complaints  Physical Exam: Vitals:   12/14/23 8119 12/14/23 1478  12/14/23 1001 12/14/23 1113  BP: 108/74 98/71 95/60  101/70  Pulse: 76 80 73 (!) 59  Resp: 14 18 16 16   Temp: 98 F (36.7 C) 97.9 F (36.6 C) 97.8 F (36.6 C) 98.7 F (37.1 C)  TempSrc: Oral Oral Oral  Oral  SpO2: 100% 100% 99% 100%  Weight:      Height:       Constitutional:      Appearance: He is obese.  HENT:     Head: Normocephalic and atraumatic.     Nose: Nose normal.     Mouth/Throat:     Mouth: Mucous membranes are moist.  Eyes:     Pupils: Pupils are equal, round, and reactive to light.  Cardiovascular:     Rate and Rhythm: Normal rate.  Pulmonary:     Effort: Pulmonary effort is normal.  Abdominal:     General: Bowel sounds are normal.  Musculoskeletal:        General: Normal range of motion.     Right TMA.  Skin:    General: Skin is warm.  Neurological:     General: No focal deficit present.  Psychiatric:        Mood and Affect: Mood normal.        Data Reviewed: White count 1.6, hemoglobin 9.5, platelet count 40, magnesium 1.6 Labs reviewed  Family Communication: Plan of care was discussed with patient at the bedside.  All questions and concerns have been addressed.  He verbalizes understanding and agrees with the plan.  Disposition: Status is: Inpatient Remains inpatient appropriate because: For planned procedure on 4/12  Planned Discharge Destination: Home    Time spent: 40 minutes  Author: Lucile Shutters, MD 12/14/2023 1:00 PM  For on call review [email or web address].

## 2023-12-15 DIAGNOSIS — S91302A Unspecified open wound, left foot, initial encounter: Secondary | ICD-10-CM | POA: Diagnosis not present

## 2023-12-15 LAB — CBC
HCT: 29.6 % — ABNORMAL LOW (ref 39.0–52.0)
Hemoglobin: 9.4 g/dL — ABNORMAL LOW (ref 13.0–17.0)
MCH: 26.4 pg (ref 26.0–34.0)
MCHC: 31.8 g/dL (ref 30.0–36.0)
MCV: 83.1 fL (ref 80.0–100.0)
Platelets: 46 10*3/uL — ABNORMAL LOW (ref 150–400)
RBC: 3.56 MIL/uL — ABNORMAL LOW (ref 4.22–5.81)
RDW: 18.3 % — ABNORMAL HIGH (ref 11.5–15.5)
WBC: 2 10*3/uL — ABNORMAL LOW (ref 4.0–10.5)
nRBC: 0 % (ref 0.0–0.2)

## 2023-12-15 LAB — BPAM PLATELET PHERESIS
Blood Product Expiration Date: 202504112359
ISSUE DATE / TIME: 202504100942
Unit Type and Rh: 202504112359
Unit Type and Rh: 6200

## 2023-12-15 LAB — BASIC METABOLIC PANEL WITH GFR
Anion gap: 3 — ABNORMAL LOW (ref 5–15)
BUN: 10 mg/dL (ref 6–20)
CO2: 24 mmol/L (ref 22–32)
Calcium: 8.3 mg/dL — ABNORMAL LOW (ref 8.9–10.3)
Chloride: 108 mmol/L (ref 98–111)
Creatinine, Ser: 0.64 mg/dL (ref 0.61–1.24)
GFR, Estimated: >60 mL/min (ref >60)
Glucose, Bld: 115 mg/dL — ABNORMAL HIGH (ref 70–99)
Potassium: 3.8 mmol/L (ref 3.5–5.1)
Sodium: 135 mmol/L (ref 135–145)

## 2023-12-15 LAB — PHOSPHORUS: Phosphorus: 2.8 mg/dL (ref 2.5–4.6)

## 2023-12-15 LAB — PREPARE PLATELET PHERESIS: Unit division: 0

## 2023-12-15 LAB — MAGNESIUM: Magnesium: 2 mg/dL (ref 1.7–2.4)

## 2023-12-15 MED ORDER — VANCOMYCIN HCL 1750 MG/350ML IV SOLN
1750.0000 mg | Freq: Two times a day (BID) | INTRAVENOUS | Status: DC
  Administered 2023-12-15: 1750 mg via INTRAVENOUS
  Filled 2023-12-15 (×2): qty 350

## 2023-12-15 MED ORDER — SODIUM CHLORIDE 0.9% IV SOLUTION: Freq: Once | INTRAVENOUS | Status: AC

## 2023-12-15 MED ORDER — OXYCODONE HCL 5 MG PO TABS
10.0000 mg | ORAL_TABLET | Freq: Four times a day (QID) | ORAL | Status: DC | PRN
  Administered 2023-12-15 – 2023-12-16 (×3): 10 mg via ORAL
  Filled 2023-12-15 (×3): qty 2

## 2023-12-15 NOTE — Plan of Care (Signed)

## 2023-12-15 NOTE — Progress Notes (Signed)
 Progress Note   Patient: Derrick Blanchard ZOX:[id number] DOB: 09-Oct-1980 DOA: 12/13/2023     1 DOS: the patient was seen and examined on 12/15/2023   Brief hospital course: Derrick Blanchard is a 43 y.o. male with medical history significant of cirrhosis, depression, alcohol abuse, hypertension, substance abuse presenting with left lower extremity wound.  Patient reports history of prior lower extremity wounds that have been managed as well as amputated in the [facility] system.  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  Has been followed by outpatient wound care for right great toe wound on the plantar aspect.  Has had multiple courses of antibiotics with minimal improvement in symptoms.  Noted worsening pain and intermittent purulent drainage.  Baseline alcoholic.  Drinks roughly 15 pack every day versus multiple 40 ounces.  No reported illicit drug use.  Denies any known history of type 2 diabetes.  No abdominal pain.  No nausea or vomiting.  Pain has become unbearable.  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 1/4 PPD smoker.  Presented to the ER afebrile, hemodynamically stable.  Satting well on room air.  White count 1.6, hemoglobin 10.4, platelets 39, creatinine 0.58.  LFTs as well as INR pending.  Left foot plain films with 1 x 1.9 cm lesion lucency over plantar aspect of the second head and metatarsal region with associated soft tissue swelling. Review of Systems: As mentioned in the history of present illness. All other systems reviewed and are negative       Assessment and Plan:  Open wound of left lower extremity Worsening LLE wound despite outpatient antibiotic regimen x 2- sent over from wound care clinic Active ETOH abuse and cirrhotic disease are confounding issues MRI foot shows plantar soft tissue ulceration underlying the first MTP joint. Surrounding soft tissue swelling with ill-defined fluid is concerning for cellulitis with phlegmonous changes. No  loculated collection identified. Mild marrow edema of the lateral hallux sesamoid, similar to the prior exam. This could reflect sesamoiditis or reactive marrow change. Early osteomyelitis can not be entirely excluded. Small first MTP joint effusion may be reactive, however, septic arthritis can not be excluded. 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 Mild degenerative changes of the midfoot and first MTP joint. Patient is on empiric antibiotic therapy with IV cefepime, flagyl and vancomycin Appreciate podiatry input, patient was scheduled for left first MPJ ulceration irrigation debridement with bone biopsy possible fibular sesamoidectomy and graft application on 04/10.  Procedure was canceled due to persistent thrombocytopenia despite platelet transfusion Podiatrist recommends to continue IV abx broad spectrum pending further culture data -if bone biopsies are positive for osteomyelitis he may need long-term IV antibiotic therapy Surgery has been rescheduled for 04/12 Will likely need platelet transfusion prior to surgery in a.m. Pain control       Alcoholic cirrhosis of liver (HCC) Portal hypertension Pancytopenia Continued alcohol abuse Monitor closely for signs and symptoms of alcohol withdrawal Continue lorazepam and administer for CIWA score of 8 or greater Continue MVI and thiamine Pancytopenia persists and patient will require platelet transfusion prior to procedure on 4/12 to keep platelet count greater than 50       GERD (gastroesophageal reflux disease) Continue PPI        Class I obesity Complicates his overall prognosis and care Lifestyle modification and exercise has been discussed with patient          Subjective: Has tremors with his alcohol withdrawal.  Continues to complain of pain in his left foot  Physical Exam: Vitals:   12/14/23 1544 12/14/23 2020 12/15/23 0327 12/15/23 0804  BP: (!) 102/56 111/69 126/78 104/82  Pulse: 72 65 79 85  Resp:  16 16 16 16   Temp: (!) 97.4 F (36.3 C) 97.9 F (36.6 C) 98 F (36.7 C) 97.9 F (36.6 C)  TempSrc: Oral Oral Oral   SpO2: 100% 100% 100% 100%  Weight:      Height:       Constitutional:      Appearance: He is obese.  HENT:     Head: Normocephalic and atraumatic.     Nose: Nose normal.     Mouth/Throat:     Mouth: Mucous membranes are moist.  Eyes:     Pupils: Pupils are equal, round, and reactive to light.  Cardiovascular:     Rate and Rhythm: Normal rate.  Pulmonary:     Effort: Pulmonary effort is normal.  Abdominal:     General: Bowel sounds are normal.  Musculoskeletal:        General: Normal range of motion.     Right TMA.  Skin:    General: Skin is warm.  Neurological:     General: No focal deficit present.  Psychiatric:        Mood and Affect: Mood normal.            Data Reviewed: White count 2.0, hemoglobin 9.4, platelet count 46 Labs reviewed  Family Communication: Plan of care discussed with patient in detail.  He verbalizes understanding and agrees with the plan  Disposition: Status is: Inpatient Remains inpatient appropriate because: For surgery in a.m.  Planned Discharge Destination: Home    Time spent: 36 minutes  Author: Lucile Shutters, MD 12/15/2023 2:21 PM  For on call review [email or web address].

## 2023-12-15 NOTE — Consult Note (Signed)
 PHARMACY CONSULT NOTE - ELECTROLYTES  Pharmacy Consult for Electrolyte Monitoring and Replacement   Recent Labs: Height: 5\' 8"  (172.7 cm) Weight: 97.5 kg (215 lb) IBW/kg (Calculated) : 68.4 Estimated Creatinine Clearance: 138.9 mL/min (by C-G formula based on SCr of 0.64 mg/dL). Potassium (mmol/L)  Date Value  12/15/2023 3.8  02/12/2013 4.7   Magnesium (mg/dL)  Date Value  16/06/9603 2.0  02/11/2013 2.0   Calcium (mg/dL)  Date Value  54/05/8118 8.3 (L)   Calcium, Total (mg/dL)  Date Value  14/78/2956 8.3 (L)   Albumin (g/dL)  Date Value  21/30/8657 2.8 (L)  11/24/2017 2.9 (L)  02/11/2013 2.3 (L)   Phosphorus (mg/dL)  Date Value  84/69/6295 2.8   Sodium (mmol/L)  Date Value  12/15/2023 135  11/24/2017 144  02/12/2013 138    Assessment  Derrick Blanchard is a 43 y.o. male presenting with infected left foot wound. PMH significant for HCV, neuropathy, chronic left foot wound, and cirrhosis. Pharmacy has been consulted to monitor and replace electrolytes.  Diet: heart healthy MIVF: N/A Pertinent medications: N/A  Goal of Therapy: Electrolytes WNL  Plan:  No electrolyte replacement indicated at this time Check BMP, Mg, Phos with AM labs  Thank you for allowing pharmacy to be a part of this patient's care.  Barrie Folk, PharmD Clinical Pharmacist 12/15/2023 7:01 AM

## 2023-12-15 NOTE — Consult Note (Addendum)
 Pharmacy Antibiotic Note  Derrick Blanchard is a 43 y.o. male admitted on 12/13/2023 with left foot wound infection.  Patient sent to ED from wound healing center.  Patient on oral antibiotics prior to admission, based on fill history likely Augmentin and doxycycline. Pharmacy has been consulted for vancomycin and cefepime dosing.  Afebrile, WBC = 1.6  I&D scheduled for 4/12  Plan: Continue vancomycin 1750 mg IV every 12 hours Estimated AUC 484, Cmin 12.6 IBW, Scr rounded to 0.8, Vd 0.72 Will order Vancomycin levels: Peak 4/11 @ 2130 = Trough 4/12 @ 0500 = Continue cefepime 2 grams IV every 8 hours Flagyl 500 mg IV every 12 hours per provider Follow renal function and cultures for adjustments  Height: 5\' 8"  (172.7 cm) Weight: 97.5 kg (215 lb) IBW/kg (Calculated) : 68.4  Temp (24hrs), Avg:98 F (36.7 C), Min:97.4 F (36.3 C), Max:98.7 F (37.1 C)  Recent Labs  Lab 12/13/23 0924 12/14/23 0446 12/14/23 1124 12/15/23 0550  WBC 1.6* 1.7* 1.6*  --   CREATININE 0.58* 0.57*  --  0.64    Estimated Creatinine Clearance: 138.9 mL/min (by C-G formula based on SCr of 0.64 mg/dL).    No Known Allergies  Antimicrobials this admission: Cefepime 4/9 >>  Vancomycin 4/9 >> Flagyl 4/9>>   Microbiology results: N/A  Thank you for allowing pharmacy to be a part of this patient's care.  Barrie Folk, PharmD 12/15/2023 7:06 AM

## 2023-12-16 ENCOUNTER — Encounter
Admission: EM | Disposition: A | Discharge status: Home Health | Payer: Self-pay | Source: Home / Self Care | Attending: Internal Medicine

## 2023-12-16 ENCOUNTER — Inpatient Hospital Stay

## 2023-12-16 ENCOUNTER — Other Ambulatory Visit: Payer: Self-pay

## 2023-12-16 ENCOUNTER — Inpatient Hospital Stay: Admitting: Anesthesiology

## 2023-12-16 DIAGNOSIS — S91302A Unspecified open wound, left foot, initial encounter: Secondary | ICD-10-CM | POA: Diagnosis not present

## 2023-12-16 HISTORY — PX: IRRIGATION AND DEBRIDEMENT FOOT: SHX6602

## 2023-12-16 LAB — BASIC METABOLIC PANEL WITH GFR
Anion gap: 4 — ABNORMAL LOW (ref 5–15)
BUN: 14 mg/dL (ref 6–20)
CO2: 23 mmol/L (ref 22–32)
Calcium: 8.4 mg/dL — ABNORMAL LOW (ref 8.9–10.3)
Chloride: 110 mmol/L (ref 98–111)
Creatinine, Ser: 0.62 mg/dL (ref 0.61–1.24)
GFR, Estimated: >60 mL/min (ref >60)
Glucose, Bld: 74 mg/dL (ref 70–99)
Potassium: 3.7 mmol/L (ref 3.5–5.1)
Sodium: 137 mmol/L (ref 135–145)

## 2023-12-16 LAB — CBC
HCT: 32.1 % — ABNORMAL LOW (ref 39.0–52.0)
HCT: 32.3 % — ABNORMAL LOW (ref 39.0–52.0)
Hemoglobin: 10.1 g/dL — ABNORMAL LOW (ref 13.0–17.0)
Hemoglobin: 10.2 g/dL — ABNORMAL LOW (ref 13.0–17.0)
MCH: 26.6 pg (ref 26.0–34.0)
MCH: 26.1 pg (ref 26.0–34.0)
MCHC: 31.5 g/dL (ref 30.0–36.0)
MCHC: 31.6 g/dL (ref 30.0–36.0)
MCV: 84.5 fL (ref 80.0–100.0)
MCV: 82.6 fL (ref 80.0–100.0)
Platelets: 52 10*3/uL — ABNORMAL LOW (ref 150–400)
Platelets: 63 10*3/uL — ABNORMAL LOW (ref 150–400)
RBC: 3.8 MIL/uL — ABNORMAL LOW (ref 4.22–5.81)
RBC: 3.91 MIL/uL — ABNORMAL LOW (ref 4.22–5.81)
RDW: 18.4 % — ABNORMAL HIGH (ref 11.5–15.5)
RDW: 18.7 % — ABNORMAL HIGH (ref 11.5–15.5)
WBC: 2.6 10*3/uL — ABNORMAL LOW (ref 4.0–10.5)
WBC: 2.7 10*3/uL — ABNORMAL LOW (ref 4.0–10.5)
nRBC: 0 % (ref 0.0–0.2)
nRBC: 0 % (ref 0.0–0.2)

## 2023-12-16 LAB — PHOSPHORUS: Phosphorus: 2.8 mg/dL (ref 2.5–4.6)

## 2023-12-16 LAB — VANCOMYCIN, PEAK: Vancomycin Pk: 36 ug/mL (ref 30–40)

## 2023-12-16 LAB — MAGNESIUM: Magnesium: 1.8 mg/dL (ref 1.7–2.4)

## 2023-12-16 LAB — VANCOMYCIN, TROUGH: Vancomycin Tr: 17 ug/mL (ref 15–20)

## 2023-12-16 SURGERY — IRRIGATION AND DEBRIDEMENT ABSCESS
Anesthesia: Choice | Laterality: Left

## 2023-12-16 SURGERY — IRRIGATION AND DEBRIDEMENT FOOT
Anesthesia: General | Laterality: Left

## 2023-12-16 MED ORDER — VANCOMYCIN HCL 1250 MG/250ML IV SOLN
1250.0000 mg | Freq: Two times a day (BID) | INTRAVENOUS | Status: DC
  Administered 2023-12-16 – 2023-12-18 (×6): 1250 mg via INTRAVENOUS
  Filled 2023-12-16 (×8): qty 250

## 2023-12-16 MED ORDER — PROPOFOL 10 MG/ML IV BOLUS
INTRAVENOUS | Status: DC | PRN
  Administered 2023-12-16: 100 mg via INTRAVENOUS

## 2023-12-16 MED ORDER — BUPIVACAINE HCL (PF) 0.5 % IJ SOLN
INTRAMUSCULAR | Status: DC | PRN
  Administered 2023-12-16: 20 mL

## 2023-12-16 MED ORDER — FENTANYL CITRATE (PF) 100 MCG/2ML IJ SOLN
INTRAMUSCULAR | Status: DC | PRN
Start: 2023-12-16 — End: 2023-12-16
  Administered 2023-12-16: 100 ug via INTRAVENOUS

## 2023-12-16 MED ORDER — MIDAZOLAM HCL 2 MG/2ML IJ SOLN
INTRAMUSCULAR | Status: AC
  Filled 2023-12-16: qty 2

## 2023-12-16 MED ORDER — PROPOFOL 500 MG/50ML IV EMUL
INTRAVENOUS | Status: DC | PRN
  Administered 2023-12-16: 85 ug/kg/min via INTRAVENOUS

## 2023-12-16 MED ORDER — MIDAZOLAM HCL 2 MG/2ML IJ SOLN
INTRAMUSCULAR | Status: DC | PRN
  Administered 2023-12-16: 2 mg via INTRAVENOUS

## 2023-12-16 MED ORDER — OXYCODONE HCL 5 MG PO TABS
10.0000 mg | ORAL_TABLET | ORAL | Status: DC | PRN
  Administered 2023-12-16 – 2023-12-22 (×30): 10 mg via ORAL
  Filled 2023-12-16 (×30): qty 2

## 2023-12-16 MED ORDER — 0.9 % SODIUM CHLORIDE (POUR BTL) OPTIME
TOPICAL | Status: DC | PRN
  Administered 2023-12-16: 400 mL

## 2023-12-16 MED ORDER — EPHEDRINE SULFATE-NACL 50-0.9 MG/10ML-% IV SOSY
PREFILLED_SYRINGE | INTRAVENOUS | Status: DC | PRN
  Administered 2023-12-16 (×2): 10 mg via INTRAVENOUS

## 2023-12-16 MED ORDER — OXYCODONE HCL 5 MG PO TABS
5.0000 mg | ORAL_TABLET | ORAL | Status: DC | PRN
  Administered 2023-12-16: 5 mg via ORAL
  Filled 2023-12-16: qty 1

## 2023-12-16 MED ORDER — DEXMEDETOMIDINE HCL IN NACL 80 MCG/20ML IV SOLN
INTRAVENOUS | Status: DC | PRN
Start: 2023-12-16 — End: 2023-12-16
  Administered 2023-12-16: 8 ug via INTRAVENOUS

## 2023-12-16 MED ORDER — MIDAZOLAM HCL 2 MG/2ML IJ SOLN
INTRAMUSCULAR | Status: AC
Start: 2023-12-16 — End: ?
  Filled 2023-12-16: qty 2

## 2023-12-16 MED ORDER — PHENYLEPHRINE 80 MCG/ML (10ML) SYRINGE FOR IV PUSH (FOR BLOOD PRESSURE SUPPORT)
PREFILLED_SYRINGE | INTRAVENOUS | Status: DC | PRN
  Administered 2023-12-16 (×8): 80 ug via INTRAVENOUS

## 2023-12-16 MED ORDER — LACTATED RINGERS IV SOLN: INTRAVENOUS | Status: DC | PRN

## 2023-12-16 MED ORDER — FENTANYL CITRATE (PF) 100 MCG/2ML IJ SOLN
INTRAMUSCULAR | Status: AC
  Filled 2023-12-16: qty 2

## 2023-12-16 MED ORDER — MAGNESIUM SULFATE 2 GM/50ML IV SOLN
2.0000 g | Freq: Once | INTRAVENOUS | Status: AC
  Administered 2023-12-16: 2 g via INTRAVENOUS
  Filled 2023-12-16: qty 50

## 2023-12-16 MED ORDER — SODIUM CHLORIDE 0.9 % IV BOLUS
1000.0000 mL | Freq: Once | INTRAVENOUS | Status: AC
  Administered 2023-12-16: 1000 mL via INTRAVENOUS

## 2023-12-16 MED ORDER — PROPOFOL 1000 MG/100ML IV EMUL
INTRAVENOUS | Status: AC
  Filled 2023-12-16: qty 100

## 2023-12-16 SURGICAL SUPPLY — 27 items
BLADE SURG 15 STRL LF DISP TIS (BLADE) ×1 IMPLANT
BNDG STRETCH 4X75 STRL LF (GAUZE/BANDAGES/DRESSINGS) IMPLANT
DRSG TELFA 3X4 N-ADH STERILE (GAUZE/BANDAGES/DRESSINGS) IMPLANT
ELECT REM PT RETURN 9FT ADLT (ELECTROSURGICAL) ×1 IMPLANT
ELECTRODE REM PT RTRN 9FT ADLT (ELECTROSURGICAL) ×1 IMPLANT
GAUZE SPONGE 4X4 12PLY STRL (GAUZE/BANDAGES/DRESSINGS) ×1 IMPLANT
GAUZE XEROFORM 1X8 LF (GAUZE/BANDAGES/DRESSINGS) ×1 IMPLANT
GLOVE BIO SURGEON STRL SZ7.5 (GLOVE) ×1 IMPLANT
GLOVE INDICATOR 8.0 STRL GRN (GLOVE) ×1 IMPLANT
GOWN STRL REUS W/ TWL LRG LVL3 (GOWN DISPOSABLE) ×1 IMPLANT
GOWN STRL REUS W/TWL MED LVL3 (GOWN DISPOSABLE) ×1 IMPLANT
GOWN STRL REUS W/TWL XL LVL4 (GOWN DISPOSABLE) ×1 IMPLANT
KIT TURNOVER KIT A (KITS) ×1 IMPLANT
LABEL OR SOLS (LABEL) ×1 IMPLANT
MANIFOLD NEPTUNE II (INSTRUMENTS) ×1 IMPLANT
NDL BIOPSY JAMSHIDI 11X6 (NEEDLE) IMPLANT
NDL FILTER BLUNT 18X1 1/2 (NEEDLE) ×1 IMPLANT
NDL HYPO 25X1 1.5 SAFETY (NEEDLE) ×1 IMPLANT
NEEDLE BIOPSY JAMSHIDI 11X6 (NEEDLE) ×3 IMPLANT
NEEDLE FILTER BLUNT 18X1 1/2 (NEEDLE) ×1 IMPLANT
NEEDLE HYPO 25X1 1.5 SAFETY (NEEDLE) ×1 IMPLANT
NS IRRIG 500ML POUR BTL (IV SOLUTION) ×1 IMPLANT
PACK EXTREMITY ARMC (MISCELLANEOUS) ×1 IMPLANT
PAD ABD DERMACEA PRESS 5X9 (GAUZE/BANDAGES/DRESSINGS) IMPLANT
STOCKINETTE IMPERVIOUS 9X36 MD (GAUZE/BANDAGES/DRESSINGS) ×1 IMPLANT
SUT ETHILON 2 0 FS 18 (SUTURE) ×2 IMPLANT
SYR 10ML LL (SYRINGE) ×1 IMPLANT

## 2023-12-16 NOTE — Consult Note (Signed)
 PHARMACY CONSULT NOTE - ELECTROLYTES  Pharmacy Consult for Electrolyte Monitoring and Replacement   Recent Labs: Height: 5\' 8"  (172.7 cm) Weight: 97.5 kg (215 lb) IBW/kg (Calculated) : 68.4 Estimated Creatinine Clearance: 138.9 mL/min (by C-G formula based on SCr of 0.62 mg/dL). Potassium (mmol/L)  Date Value  12/16/2023 3.7  02/12/2013 4.7   Magnesium (mg/dL)  Date Value  30/86/5784 1.8  02/11/2013 2.0   Calcium (mg/dL)  Date Value  69/62/9528 8.4 (L)   Calcium, Total (mg/dL)  Date Value  41/32/4401 8.3 (L)   Albumin (g/dL)  Date Value  02/72/5366 2.8 (L)  11/24/2017 2.9 (L)  02/11/2013 2.3 (L)   Phosphorus (mg/dL)  Date Value  44/11/4740 2.8   Sodium (mmol/L)  Date Value  12/16/2023 137  11/24/2017 144  02/12/2013 138    Assessment  Derrick Blanchard is a 43 y.o. male presenting with infected left foot wound. PMH significant for HCV, neuropathy, chronic left foot wound, and cirrhosis. Pharmacy has been consulted to monitor and replace electrolytes.  Diet: NPO MIVF: N/A Pertinent medications: N/A  Goal of Therapy: Electrolytes WNL  Plan:  Mag 1.8  Will order magnesium sulfate 2 gm IV x1 Check BMP, Mg, Phos with AM labs  Thank you for allowing pharmacy to be a part of this patient's care.  Scotty Cyphers, PharmD Clinical Pharmacist 12/16/2023 8:06 AM

## 2023-12-16 NOTE — Brief Op Note (Addendum)
 12/16/2023  11:13 AM  PATIENT:  Derrick Blanchard  43 y.o. male  PRE-OPERATIVE DIAGNOSIS:  Left foot Osteomyelitis  POST-OPERATIVE DIAGNOSIS:  S/P left foot I & D  PROCEDURE:  Procedure(s) with comments: IRRIGATION AND DEBRIDEMENT FOOT bone biopsy (Left)  SURGEON:  Surgeons and Role:    * Amiyah Shryock L, DPM - Primary  PHYSICIAN ASSISTANT:   ASSISTANTS: none   ANESTHESIA:   MAC  EBL:  100 mL   BLOOD ADMINISTERED:none  DRAINS: none   LOCAL MEDICATIONS USED:  BUPIVICAINE  and XYLOCAINE   SPECIMEN:  Source of Specimen:  Deep wound tissue culture, core bone biopsy of lateral sesamoid, proximal phalanx first toe, head of first metatarsal  DISPOSITION OF SPECIMEN:  PATHOLOGY  COUNTS:  YES  TOURNIQUET:  * No tourniquets in log *  DICTATION: .Note written in EPIC  PLAN OF CARE:  Return to floor  PATIENT DISPOSITION:  PACU - hemodynamically stable.   Delay start of Pharmacological VTE agent (>24hrs) due to surgical blood loss or risk of bleeding: yes

## 2023-12-16 NOTE — Consult Note (Signed)
 Pharmacy Antibiotic Note  Derrick Blanchard is a 43 y.o. male admitted on 12/13/2023 with left foot wound infection.  Patient sent to ED from wound healing center.  Patient on oral antibiotics prior to admission, based on fill history likely Augmentin and doxycycline. Pharmacy has been consulted for vancomycin and cefepime dosing.  Afebrile, WBC = 1.6  I&D scheduled for 4/12  Plan: Continue vancomycin 1750 mg IV every 12 hours Estimated AUC 484, Cmin 12.6 IBW, Scr rounded to 0.8, Vd 0.72 Will order Vancomycin levels: Peak 4/11 @ 2142 = 36 Trough 4/12 @ 0434 = 17                   Calculated AUC = 737, SUPRAtherapeutic  - Will decrease dose to Vancomycin 1250 mg IV Q12H to begin on 4/12 @ 0700    Expected AUC = 525        Vanc trough = 11.5   - Will recheck vanc levels around 3rd new dose   Vanc peak 4/13 @ 0930    Vanc trough 4/13 @ 1830    Continue cefepime 2 grams IV every 8 hours Flagyl 500 mg IV every 12 hours per provider Follow renal function and cultures for adjustments  Height: 5\' 8"  (172.7 cm) Weight: 97.5 kg (215 lb) IBW/kg (Calculated) : 68.4  Temp (24hrs), Avg:97.9 F (36.6 C), Min:97.6 F (36.4 C), Max:98.3 F (36.8 C)  Recent Labs  Lab 12/13/23 0924 12/14/23 0446 12/14/23 1124 12/15/23 0550 12/15/23 2142 12/16/23 0434 12/16/23 0435  WBC 1.6* 1.7* 1.6* 2.0*  --   --  2.6*  CREATININE 0.58* 0.57*  --  0.64  --   --  0.62  VANCOTROUGH  --   --   --   --   --  17  --   VANCOPEAK  --   --   --   --  36  --   --     Estimated Creatinine Clearance: 138.9 mL/min (by C-G formula based on SCr of 0.62 mg/dL).    No Known Allergies  Antimicrobials this admission: Cefepime 4/9 >>  Vancomycin 4/9 >> Flagyl 4/9>>   Microbiology results: N/A  Thank you for allowing pharmacy to be a part of this patient's care.  Elaf Clauson D, PharmD 12/16/2023 6:11 AM

## 2023-12-16 NOTE — Anesthesia Preprocedure Evaluation (Addendum)
 Anesthesia Evaluation  Patient identified by MRN, date of birth, ID band Patient awake    Reviewed: Allergy & Precautions, H&P , NPO status , Patient's Chart, lab work & pertinent test results  Airway Mallampati: III  TM Distance: >3 FB Neck ROM: full    Dental  (+) Chipped   Pulmonary Current Smoker   Pulmonary exam normal        Cardiovascular hypertension, Normal cardiovascular exam+ Valvular Problems/Murmurs      Neuro/Psych  PSYCHIATRIC DISORDERS Anxiety Depression     Neuromuscular disease    GI/Hepatic ,GERD  ,,(+) Cirrhosis         Endo/Other  negative endocrine ROS    Renal/GU negative Renal ROS  negative genitourinary   Musculoskeletal   Abdominal  (+) + obese  Peds  Hematology  (+) Blood dyscrasia, anemia   Anesthesia Other Findings Left foot Osteomyelitis CIWA for alcohol abuse  Thrombocytopenia now s/p 2 units platelets   Past Medical History: No date: Anemia No date: Anxiety No date: Cirrhosis (HCC) No date: Depression No date: Heart murmur No date: Hepatitis No date: Hypertension No date: Substance abuse [facility])  Past Surgical History: No date: COLONOSCOPY 02/03/2015: ESOPHAGOGASTRODUODENOSCOPY (EGD) WITH PROPOFOL; N/A     Comment:  Procedure: ESOPHAGOGASTRODUODENOSCOPY (EGD) WITH               PROPOFOL;  Surgeon: Luella Sager, MD;  Location:               [facility] ENDOSCOPY;  Service: Endoscopy;  Laterality: N/A; 12/21/2017: ESOPHAGOGASTRODUODENOSCOPY (EGD) WITH PROPOFOL; N/A     Comment:  Procedure: ESOPHAGOGASTRODUODENOSCOPY (EGD) WITH               PROPOFOL;  Surgeon: Luke Salaam, MD;  Location: [facility]               ENDOSCOPY;  Service: Gastroenterology;  Laterality: N/A; No date: FISSURECTOMY  BMI    Body Mass Index: 32.69 kg/m      Reproductive/Obstetrics negative OB ROS                             Anesthesia Physical Anesthesia Plan  ASA:  3  Anesthesia Plan: General   Post-op Pain Management: Regional block*   Induction: Intravenous  PONV Risk Score and Plan: Propofol infusion and TIVA  Airway Management Planned: Natural Airway  Additional Equipment:   Intra-op Plan:   Post-operative Plan:   Informed Consent: I have reviewed the patients History and Physical, chart, labs and discussed the procedure including the risks, benefits and alternatives for the proposed anesthesia with the patient or authorized representative who has indicated his/her understanding and acceptance.     Dental Advisory Given  Plan Discussed with: CRNA and Surgeon  Anesthesia Plan Comments:         Anesthesia Quick Evaluation

## 2023-12-16 NOTE — H&P (Signed)
 History and Physical Interval Note:  12/16/2023 8:13 AM  Derrick Blanchard  has presented today for surgery, with the diagnosis of chronic ulceration of left first MPJ with concern of osteomyelitis.  The various methods of treatment have been discussed with the patient and family. After consideration of risks, benefits and other options for treatment, the patient has consented to   Procedure(s): IRRIGATION AND DEBRIDEMENT FOOT (Left) BIOPSY, BONE (Left) POSSIBLE EXCISION, SESAMOID BONE (Left) POSSIBLE GRAFT APPLICATION (Left) as a surgical intervention.  The patient's history has been reviewed, patient examined, no change in status, stable for surgery.  I have reviewed the patient's chart and labs.  Questions were answered to the patient's satisfaction.     Derrick Blanchard

## 2023-12-16 NOTE — Transfer of Care (Signed)
 Immediate Anesthesia Transfer of Care Note  Patient: Derrick Blanchard  Procedure(s) Performed: IRRIGATION AND DEBRIDEMENT FOOT, possible sesamoidectomy, bone biopsy (Left)  Patient Location: PACU  Anesthesia Type:General  Level of Consciousness: drowsy  Airway & Oxygen Therapy: Patient Spontanous Breathing and Patient connected to face mask oxygen  Post-op Assessment: Report given to RN, Post -op Vital signs reviewed and stable, and Patient moving all extremities  Post vital signs: Reviewed and stable  Last Vitals:  Vitals Value Taken Time  BP 109/56 12/16/23 1121  Temp    Pulse 52 12/16/23 1124  Resp    SpO2 100 % 12/16/23 1124  Vitals shown include unfiled device data.  Last Pain:  Vitals:   12/16/23 0737  TempSrc: Oral  PainSc:       Patients Stated Pain Goal: 0 (12/15/23 2050)  Complications: No notable events documented.

## 2023-12-16 NOTE — Anesthesia Postprocedure Evaluation (Signed)
 Anesthesia Post Note  Patient: Derrick Blanchard  Procedure(s) Performed: IRRIGATION AND DEBRIDEMENT FOOT, possible sesamoidectomy, bone biopsy (Left)  Patient location during evaluation: PACU Anesthesia Type: General Level of consciousness: awake and alert Pain management: pain level controlled Vital Signs Assessment: post-procedure vital signs reviewed and stable Respiratory status: spontaneous breathing, nonlabored ventilation and respiratory function stable Cardiovascular status: blood pressure returned to baseline and stable Postop Assessment: no apparent nausea or vomiting Anesthetic complications: no   No notable events documented.   Last Vitals:  Vitals:   12/16/23 1130 12/16/23 1140  BP: (!) 88/51 (!) 98/56  Pulse: 61   Resp:    Temp:    SpO2: 100%     Last Pain:  Vitals:   12/16/23 1200  TempSrc:   PainSc: 0-No pain                 Baltazar Bonier

## 2023-12-16 NOTE — Op Note (Signed)
 Patient Name: Derrick Blanchard DOB: [birth year]  MRN: [id number]   Date of Service: 12/13/2023 - 12/16/2023  Surgeon: Dr. Eve Hinders, DPM Assistants: None Pre-operative Diagnosis:  Left foot Osteomyelitis Post-operative Diagnosis:  S/P left foot I & D Procedures: Procedure:    IRRIGATION AND DEBRIDEMENT FOOT, possible sesamoidectomy, bone biopsy CPT(R) Code:  28002 - PR I&D BELOW FASCIA FOOT 1 BURSAL SPACE  Pathology/Specimens: ID Type Source Tests Collected by Time Destination  1 : Lateral sesamoid bone Tissue Bone SURGICAL PATHOLOGY Reina Cara, DPM 12/16/2023 1043   2 : Left  1st metatarsal Tissue PATH Other SURGICAL PATHOLOGY Reina Cara, DPM 12/16/2023 1106   3 : Left Proximal phalanx Tissue PATH Other SURGICAL PATHOLOGY Reina Cara, DPM 12/16/2023 1108   A : Left foot deep wound culture Tissue Wound AEROBIC/ANAEROBIC CULTURE W GRAM STAIN (SURGICAL/DEEP WOUND) Reina Cara, DPM 12/16/2023 1035    Anesthesia: MAC with local Hemostasis: * No tourniquets in log * Estimated Blood Loss: 100 mL Materials: * No implants in log * Medications: Schedule antibiotics, 20 cc of one-to-one ratio of 1% lidocaine plain and 0.5% bupivacaine plain Complications: None  Indications for Procedure:  This is a 43 y.o. male with a history of alcoholic cirrhosis and substance abuse who presented to [facility] for left foot infection.  He has had chronic ulceration to the subfirst metatarsal head left foot for a while now.  He had been on antibiotics while outpatient however experienced worsening pain and swelling.  He was admitted and started on IV antibiotics.  MRI suggestive of phlegmon underneath the ulceration, reactive marrow edema of the lateral sesamoid and of the first MPJ.   Procedure in Detail: Patient was identified in pre-operative holding area. Formal consent was signed and the left lower extremity was marked. Patient was brought back to the operating room. Anesthesia was induced. The extremity  was prepped and draped in the usual sterile fashion. Timeout was taken to confirm patient name, laterality, and procedure prior to incision.   Attention was then directed to the left subfirst metatarsal head where ulceration was present measuring 1.5 x 1.3 cm.  The ulceration was excised full-thickness.  Underlying inflammatory bursa was noted.  A portion of the bursa was resected and sent for deep tissue culture.  No purulence was noted.  Tissue appeared well-perfused.  Decision was made to extend the incision distally such as to ellipse the ulceration.  Two semi-elliptical incisions were made 4.5 cm in length which incorporated the excised ulceration at the proximal aspect.  Underlying bursa was excised at this point.  Next, the lateral sesamoid, base of the proximal phalanx and head of the first metatarsal head were identified with palpation via the open surgical wound.  All structures had overlying covering of normal anatomy and soft tissue structures without signs of obvious infection.  A Jamshidi needle was used to obtain bone pathology specimens from the lateral sesamoid, the base of the proximal phalanx, the first metatarsal head respectively.  The surgical site was irrigated copious months normal sterile saline.  Decision was made to primarily close the surgical site using 2-0 nylon using simple interrupted suture technique to reapproximate the deep subcutaneous and skin layers.  The skin incision was closed under minimal tension.  The foot was then dressed with Xeroform, 4 x 4 gauze, ABD, Kerlix, Ace wrap. Patient tolerated the procedure well.  He was transferred from the operating the PACU hemodynamically stable.   Disposition: Following a period of post-operative monitoring,  patient will be transferred back to the room on the floors.  Disposition pending results of the bone pathology.  Patient understands he may need further surgical intervention if the bone pathology is positive.

## 2023-12-17 ENCOUNTER — Encounter: Payer: Self-pay | Admitting: Podiatry

## 2023-12-17 DIAGNOSIS — S91302A Unspecified open wound, left foot, initial encounter: Secondary | ICD-10-CM | POA: Diagnosis not present

## 2023-12-17 LAB — RENAL FUNCTION PANEL
Albumin: 3 g/dL — ABNORMAL LOW (ref 3.5–5.0)
Anion gap: 7 (ref 5–15)
BUN: 11 mg/dL (ref 6–20)
CO2: 20 mmol/L — ABNORMAL LOW (ref 22–32)
Calcium: 8.3 mg/dL — ABNORMAL LOW (ref 8.9–10.3)
Chloride: 107 mmol/L (ref 98–111)
Creatinine, Ser: 0.51 mg/dL — ABNORMAL LOW (ref 0.61–1.24)
GFR, Estimated: >60 mL/min (ref >60)
Glucose, Bld: 107 mg/dL — ABNORMAL HIGH (ref 70–99)
Phosphorus: 2.4 mg/dL — ABNORMAL LOW (ref 2.5–4.6)
Potassium: 3.7 mmol/L (ref 3.5–5.1)
Sodium: 134 mmol/L — ABNORMAL LOW (ref 135–145)

## 2023-12-17 LAB — PREPARE PLATELET PHERESIS: Unit division: 0

## 2023-12-17 LAB — BPAM PLATELET PHERESIS
Blood Product Expiration Date: 202504142359
ISSUE DATE / TIME: 202504120548
Unit Type and Rh: 5100

## 2023-12-17 LAB — MAGNESIUM: Magnesium: 1.9 mg/dL (ref 1.7–2.4)

## 2023-12-17 LAB — VANCOMYCIN, PEAK
Vancomycin Pk: 8 ug/mL — ABNORMAL LOW (ref 30–40)
Vancomycin Pk: 29 ug/mL — ABNORMAL LOW (ref 30–40)

## 2023-12-17 LAB — VANCOMYCIN, TROUGH: Vancomycin Tr: 10 ug/mL — ABNORMAL LOW (ref 15–20)

## 2023-12-17 LAB — CBC
HCT: 30.3 % — ABNORMAL LOW (ref 39.0–52.0)
Hemoglobin: 9.4 g/dL — ABNORMAL LOW (ref 13.0–17.0)
MCH: 26.3 pg (ref 26.0–34.0)
MCHC: 31 g/dL (ref 30.0–36.0)
MCV: 84.9 fL (ref 80.0–100.0)
Platelets: 51 10*3/uL — ABNORMAL LOW (ref 150–400)
RBC: 3.57 MIL/uL — ABNORMAL LOW (ref 4.22–5.81)
RDW: 18.6 % — ABNORMAL HIGH (ref 11.5–15.5)
WBC: 2.9 10*3/uL — ABNORMAL LOW (ref 4.0–10.5)
nRBC: 0 % (ref 0.0–0.2)

## 2023-12-17 MED ORDER — K PHOS MONO-SOD PHOS DI & MONO 155-852-130 MG PO TABS
500.0000 mg | ORAL_TABLET | Freq: Once | ORAL | Status: AC
  Administered 2023-12-17: 500 mg via ORAL
  Filled 2023-12-17: qty 2

## 2023-12-17 MED ORDER — MAGNESIUM OXIDE -MG SUPPLEMENT 400 (240 MG) MG PO TABS
400.0000 mg | ORAL_TABLET | Freq: Once | ORAL | Status: AC
  Administered 2023-12-17: 400 mg via ORAL
  Filled 2023-12-17: qty 1

## 2023-12-17 NOTE — Plan of Care (Signed)
  Problem: Education: Goal: Knowledge of General Education information will improve Description: Including pain rating scale, medication(s)/side effects and non-pharmacologic comfort measures Outcome: Progressing   Problem: Health Behavior/Discharge Planning: Goal: Ability to manage health-related needs will improve Outcome: Progressing   Problem: Clinical Measurements: Goal: Ability to maintain clinical measurements within normal limits will improve Outcome: Progressing Goal: Will remain free from infection Outcome: Progressing Goal: Diagnostic test results will improve Outcome: Progressing Goal: Respiratory complications will improve Outcome: Progressing Goal: Cardiovascular complication will be avoided Outcome: Progressing   Problem: Nutrition: Goal: Adequate nutrition will be maintained Outcome: Progressing   Problem: Activity: Goal: Risk for activity intolerance will decrease Outcome: Progressing   Problem: Coping: Goal: Level of anxiety will decrease Outcome: Progressing   Problem: Elimination: Goal: Will not experience complications related to bowel motility Outcome: Progressing Goal: Will not experience complications related to urinary retention Outcome: Progressing   Problem: Pain Managment: Goal: General experience of comfort will improve and/or be controlled Outcome: Progressing   Problem: Safety: Goal: Ability to remain free from injury will improve Outcome: Progressing

## 2023-12-17 NOTE — Consult Note (Signed)
 PHARMACY CONSULT NOTE - ELECTROLYTES  Pharmacy Consult for Electrolyte Monitoring and Replacement   Recent Labs: Height: 5\' 8"  (172.7 cm) Weight: 97.5 kg (215 lb) IBW/kg (Calculated) : 68.4 Estimated Creatinine Clearance: 138.9 mL/min (A) (by C-G formula based on SCr of 0.51 mg/dL (L)). Potassium (mmol/L)  Date Value  12/17/2023 3.7  02/12/2013 4.7   Magnesium (mg/dL)  Date Value  16/06/9603 1.9  02/11/2013 2.0   Calcium (mg/dL)  Date Value  54/05/8118 8.3 (L)   Calcium, Total (mg/dL)  Date Value  14/78/2956 8.3 (L)   Albumin (g/dL)  Date Value  21/30/8657 3.0 (L)  11/24/2017 2.9 (L)  02/11/2013 2.3 (L)   Phosphorus (mg/dL)  Date Value  84/69/6295 2.4 (L)   Sodium (mmol/L)  Date Value  12/17/2023 134 (L)  11/24/2017 144  02/12/2013 138   Corrected Ca:  9.1    Assessment  Derrick Blanchard is a 43 y.o. male presenting with infected left foot wound. PMH significant for HCV, neuropathy, chronic left foot wound, and cirrhosis. Pharmacy has been consulted to monitor and replace electrolytes.  Diet: carb modified MIVF: N/A Pertinent medications: N/A  Goal of Therapy: Electrolytes WNL  Plan: - Phos 2.4  Will order Kphos neutral tabs 500mg  po x 1   Mag 1.9  MD ordered Magnesium 400mg  po x 1  Check BMP, Mg, Phos with AM labs  Thank you for allowing pharmacy to be a part of this patient's care.  Scotty Cyphers, PharmD Clinical Pharmacist 12/17/2023 8:54 AM

## 2023-12-17 NOTE — Progress Notes (Signed)
 Progress Note   Patient: Derrick Blanchard WUJ:[id number] DOB: 06-20-1981 DOA: 12/13/2023     3 DOS: the patient was seen and examined on 12/17/2023   Brief hospital course: Derrick Blanchard is a 43 y.o. male with medical history significant of cirrhosis, depression, alcohol abuse, hypertension, substance abuse presenting with left lower extremity wound.  Patient reports history of prior lower extremity wounds that have been managed as well as amputated in the [facility] system.  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  Has been followed by outpatient wound care for right great toe wound on the plantar aspect.  Has had multiple courses of antibiotics with minimal improvement in symptoms.  Noted worsening pain and intermittent purulent drainage.  Baseline alcoholic.  Drinks roughly 15 pack every day versus multiple 40 ounces.  No reported illicit drug use.  Denies any known history of type 2 diabetes.  No abdominal pain.  No nausea or vomiting.  Pain has become unbearable.  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 1/4 PPD smoker.  Presented to the ER afebrile, hemodynamically stable.  Satting well on room air.  White count 1.6, hemoglobin 10.4, platelets 39, creatinine 0.58.  LFTs as well as INR pending.  Left foot plain films with 1 x 1.9 cm lesion lucency over plantar aspect of the second head and metatarsal region with associated soft tissue swelling. Review of Systems: As mentioned in the history of present illness. All other systems reviewed and are negative     Assessment and Plan:  Open wound of left lower extremity Worsening LLE wound despite outpatient antibiotic regimen x 2- sent over from wound care clinic Active ETOH abuse and cirrhotic disease are confounding issues MRI foot shows plantar soft tissue ulceration underlying the first MTP joint. Surrounding soft tissue swelling with ill-defined fluid is concerning for cellulitis with phlegmonous changes. No loculated  collection identified. Mild marrow edema of the lateral hallux sesamoid, similar to the prior exam. This could reflect sesamoiditis or reactive marrow change. Early osteomyelitis can not be entirely excluded. Small first MTP joint effusion may be reactive, however, septic arthritis can not be excluded. 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 Mild degenerative changes of the midfoot and first MTP joint. Patient is on empiric antibiotic therapy with IV cefepime, flagyl and vancomycin Appreciate podiatry input, patient was scheduled for left first MPJ ulceration irrigation debridement with bone biopsy possible fibular sesamoidectomy and graft application on 04/10.  Procedure was canceled due to persistent thrombocytopenia despite platelet transfusion Podiatrist recommends to continue IV abx broad spectrum pending further culture data -if bone biopsies are positive for osteomyelitis he may need long-term IV antibiotic therapy Patient was transfused platelets with improvement in his platelet count > 50 Surgery planned for today       Alcoholic cirrhosis of liver (HCC) Portal hypertension Pancytopenia Continued alcohol abuse Monitor closely for signs and symptoms of alcohol withdrawal Continue lorazepam and administer for CIWA score of 8 or greater Continue MVI and thiamine Pancytopenia persists       GERD (gastroesophageal reflux disease) Continue PPI        Class I obesity Complicates his overall prognosis and care Lifestyle modification and exercise has been discussed with patient             Subjective: No new complaints.  Physical Exam: Vitals:   12/16/23 1633 12/16/23 1832 12/16/23 2006 12/17/23 0310  BP: 110/66 133/63 121/83 111/61  Pulse:  68 71 81 90  Resp:  20 20 16   Temp:  (!) 97.3 F (36.3 C) 97.7 F (36.5 C) 98.4 F (36.9 C)  TempSrc:  Oral    SpO2:  96% 100% 100%  Weight:      Height:       Constitutional:      Appearance: He is obese.  HENT:      Head: Normocephalic and atraumatic.     Nose: Nose normal.     Mouth/Throat:     Mouth: Mucous membranes are moist.  Eyes:     Pupils: Pupils are equal, round, and reactive to light.  Cardiovascular:     Rate and Rhythm: Normal rate.  Pulmonary:     Effort: Pulmonary effort is normal.  Abdominal:     General: Bowel sounds are normal.  Musculoskeletal:        General: Normal range of motion.     Right TMA.  Skin:    General: Skin is warm.  Neurological:     General: No focal deficit present.  Psychiatric:        Mood and Affect: Mood normal.                 Data Reviewed: White count 2.9, hemoglobin 9.4, platelet count 51 Labs reviewed  Family Communication: Plan of care discussed with patient.  He verbalizes understanding and agrees to the plan  Disposition: Status is: Inpatient Remains inpatient appropriate because: For surgery today  Planned Discharge Destination: Home    Time spent: 35 minutes  Author: Read Camel, MD 12/17/2023 12:09 PM  For on call review [email or web address].

## 2023-12-17 NOTE — Consult Note (Signed)
 Pharmacy Antibiotic Note  Derrick Blanchard is a 43 y.o. male admitted on 12/13/2023 with left foot wound infection.  Patient sent to ED from wound healing center.  Patient on oral antibiotics prior to admission, based on fill history likely Augmentin and doxycycline. Pharmacy has been consulted for vancomycin and cefepime dosing.  Afebrile, WBC = 1.6  I&D scheduled for 4/12  Plan: Continue vancomycin 1750 mg IV every 12 hours Estimated AUC 484, Cmin 12.6 IBW, Scr rounded to 0.8, Vd 0.72 Will order Vancomycin levels: Peak 4/11 @ 2142 = 36 Trough 4/12 @ 0434 = 17                   Calculated AUC = 737, SUPRAtherapeutic  - Will decrease dose to Vancomycin 1250 mg IV Q12H to begin on 4/12 @ 0700    Expected AUC = 525        Vanc trough = 11.5   - Will recheck vanc levels around 3rd new dose   Vanc peak 4/13 @ 0930    Vanc trough 4/13 @ 1830   4/13:  Vanc peak @ 1329 = 29           Vanc trough @ 2221 = 10           Calculated AUC = 447.1, therapeutic     - will continue pt on Vancomycin 1250 mg IV Q12H   Continue cefepime 2 grams IV every 8 hours Flagyl 500 mg IV every 12 hours per provider Follow renal function and cultures for adjustments  Height: 5\' 8"  (172.7 cm) Weight: 97.5 kg (215 lb) IBW/kg (Calculated) : 68.4  Temp (24hrs), Avg:99 F (37.2 C), Min:98.4 F (36.9 C), Max:99.7 F (37.6 C)  Recent Labs  Lab 12/13/23 0924 12/14/23 0446 12/14/23 1124 12/15/23 0550 12/15/23 2142 12/16/23 0434 12/16/23 0435 12/16/23 0816 12/17/23 0425 12/17/23 1044 12/17/23 1329 12/17/23 2221  WBC 1.6* 1.7* 1.6* 2.0*  --   --  2.6* 2.7* 2.9*  --   --   --   CREATININE 0.58* 0.57*  --  0.64  --   --  0.62  --  0.51*  --   --   --   VANCOTROUGH  --   --   --   --   --  17  --   --   --   --   --  10*  VANCOPEAK  --   --   --   --    < >  --   --   --   --  8* 29*  --    < > = values in this interval not displayed.    Estimated Creatinine Clearance: 138.9 mL/min (A) (by C-G  formula based on SCr of 0.51 mg/dL (L)).    No Known Allergies  Antimicrobials this admission: Cefepime 4/9 >>  Vancomycin 4/9 >> Flagyl 4/9>>   Microbiology results: N/A  Thank you for allowing pharmacy to be a part of this patient's care.  Oakley Orban D, PharmD 12/17/2023 11:25 PM

## 2023-12-17 NOTE — Progress Notes (Signed)
 Progress Note   Patient: Derrick Blanchard:[id number] DOB: [birth year] DOA: 12/13/2023     3 DOS: the patient was seen and examined on 12/17/2023   Brief hospital course:  Derrick Blanchard is a 43 y.o. male with medical history significant of cirrhosis, depression, alcohol abuse, hypertension, substance abuse presenting with left lower extremity wound.  Patient reports history of prior lower extremity wounds that have been managed as well as amputated in the [facility] system.  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  Has been followed by outpatient wound care for right great toe wound on the plantar aspect.  Has had multiple courses of antibiotics with minimal improvement in symptoms.  Noted worsening pain and intermittent purulent drainage.  Baseline alcoholic.  Drinks roughly 15 pack every day versus multiple 40 ounces.  No reported illicit drug use.  Denies any known history of type 2 diabetes.  No abdominal pain.  No nausea or vomiting.  Pain has become unbearable.  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 1/4 PPD smoker.  Presented to the ER afebrile, hemodynamically stable.  Satting well on room air.  White count 1.6, hemoglobin 10.4, platelets 39, creatinine 0.58.  LFTs as well as INR pending.  Left foot plain films with 1 x 1.9 cm lesion lucency over plantar aspect of the second head and metatarsal region with associated soft tissue swelling. Review of Systems: As mentioned in the history of present illness. All other systems reviewed and are negative   04/12 -patient is status post RRIGATION AND DEBRIDEMENT FOOT, possible sesamoidectomy, bone biopsy.  Patient was transfused 1 bag of platelets prior to procedure.  04/13 -awaiting results of bone pathology to determine patient's disposition.  Per podiatry he may need further surgical intervention if bone pathology is positive    Assessment and Plan:  Open wound of left lower extremity Rule out  osteomyelitis Worsening LLE wound despite outpatient antibiotic regimen x 2- sent over from wound care clinic Active ETOH abuse and cirrhotic disease are confounding issues MRI foot shows plantar soft tissue ulceration underlying the first MTP joint. Surrounding soft tissue swelling with ill-defined fluid is concerning for cellulitis with phlegmonous changes. No loculated collection identified. Mild marrow edema of the lateral hallux sesamoid, similar to the prior exam. This could reflect sesamoiditis or reactive marrow change. Early osteomyelitis can not be entirely excluded. Small first MTP joint effusion may be reactive, however, septic arthritis can not be excluded. 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 Mild degenerative changes of the midfoot and first MTP joint. Patient is on empiric antibiotic therapy with IV cefepime, flagyl and vancomycin Appreciate podiatry input, patient was scheduled for left first MPJ ulceration irrigation debridement with bone biopsy possible fibular sesamoidectomy and graft application on 04/10.  Procedure was canceled due to persistent thrombocytopenia despite platelet transfusion Podiatrist recommends to continue IV abx broad spectrum pending further culture data -if bone biopsies are positive for osteomyelitis he may need long-term IV antibiotic therapy Patient was transfused platelets with improvement in his platelet count > 50 prior to surgical procedure Awaiting results of bone biopsy     Alcoholic cirrhosis of liver (HCC) Portal hypertension Pancytopenia Continued alcohol abuse Monitor closely for signs and symptoms of alcohol withdrawal Continue lorazepam and administer for CIWA score of 8 or greater Continue MVI and thiamine Pancytopenia persists       GERD (gastroesophageal reflux disease) Continue PPI  Class I obesity Complicates his overall prognosis and care Lifestyle modification and exercise has been discussed with  patient              Subjective: No new complaints  Physical Exam: Vitals:   12/16/23 1633 12/16/23 1832 12/16/23 2006 12/17/23 0310  BP: 110/66 133/63 121/83 111/61  Pulse: 68 71 81 90  Resp:  20 20 16   Temp:  (!) 97.3 F (36.3 C) 97.7 F (36.5 C) 98.4 F (36.9 C)  TempSrc:  Oral    SpO2:  96% 100% 100%  Weight:      Height:       Constitutional:      Appearance: He is obese.  HENT:     Head: Normocephalic and atraumatic.     Nose: Nose normal.     Mouth/Throat:     Mouth: Mucous membranes are moist.  Eyes:     Pupils: Pupils are equal, round, and reactive to light.  Cardiovascular:     Rate and Rhythm: Normal rate.  Pulmonary:     Effort: Pulmonary effort is normal.  Abdominal:     General: Bowel sounds are normal.  Musculoskeletal:        General: Normal range of motion.     Right TMA.  Skin:    General: Skin is warm.  Neurological:     General: No focal deficit present.  Psychiatric:        Mood and Affect: Mood normal.     Data Reviewed:  Labs reviewed  Family Communication: Plan of care was discussed with patient in detail.  He verbalizes understanding and agrees with the plan  Disposition: Status is: Inpatient Remains inpatient appropriate because: Awaiting results of bone biopsy  Planned Discharge Destination: Home    Time spent: 33 minutes  Author: Read Camel, MD 12/17/2023 12:17 PM  For on call review [email or web address].

## 2023-12-17 NOTE — Progress Notes (Signed)
 Pt called nursing to room stated his dressing to his foot had blood on it. When entered room noted blood from sink to bed. Dressing at the sole of his foot with bright red blood. Stated had gotten up to move  trash can and stand to use the urinal. Encouraged pt to call nursing to help and prevent ambulating on foot or putting pressure on foot. Discussed risk of wound opening up with too much pressure which delays healing and increase risk for infection. Encouraged to use walker and use heel of foot if needed and not the ball of foot. Discussed alternative to ways to urinate in urinal as well. Outside dressing removed xerform left in place wound rewrapped with abd pads kerlix and ace wrap. Pt receptive. Walker left at bedside within reach.

## 2023-12-17 NOTE — Progress Notes (Signed)
 PODIATRY PROGRESS NOTE Patient Name: Derrick Blanchard  DOB November 18, 1980 DOA 12/13/2023  Hospital Day: 5  Assessment:  43 y.o. male with medical history significant of cirrhosis, depression, alcohol abuse, hypertension, substance abuse, who podiatry is following for infected left subfirst metatarsal ulceration.  Patient is status post left foot excision of ulceration with wound closure, and open biopsies of first proximal phalanx, first metatarsal head, fibular sesamoid 12/16/2023  VSS  WBC: 2.9  Wound/Bone Cultures: Pending  Imaging:  IMPRESSION: 1. Plantar soft tissue ulceration underlying the first MTP joint. Surrounding soft tissue swelling with ill-defined fluid is concerning for cellulitis with phlegmonous changes. No loculated collection identified. 2. Mild marrow edema of the lateral hallux sesamoid, similar to the prior exam. This could reflect sesamoiditis or reactive marrow change. Early osteomyelitis can not be entirely excluded. 3. Small first MTP joint effusion may be reactive, however, septic arthritis can not be excluded. 4. 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 5. Mild degenerative changes of the midfoot and first MTP joint.     Electronically Signed   By: Mannie Seek M.D.   On: 12/13/2023 12:27 Plan:  - Findings and treatment discussed with patient - Dressing changed today due to bleeding from surgical site secondary due to ambulation yesterday.  Dressed with Xeroform to incision site, 4 x 4 gauze, ABD, Kerlix, Ace wrap - Stressed need for strict nonweightbearing left foot.  Postop shoe ordered for patient to be worn when out of bed -PT OT consult placed -Awaiting surgical cultures and bone pathology. Suspect large advential bursitis over abscess. Bone quality did seem viable intraoperatively         Reina Cara, DPM [facility]    Subjective:  Patient resting comfortably at bedside.  States pain is well-controlled.   Does report ambulating some yesterday on the operative foot causing site to bleed.  Objective:   Vitals:   12/17/23 0310 12/17/23 1808  BP: 111/61 103/67  Pulse: 90 77  Resp: 16   Temp: 98.4 F (36.9 C) 98.8 F (37.1 C)  SpO2: 100% 99%       Latest Ref Rng & Units 12/17/2023    4:25 AM 12/16/2023    8:16 AM 12/16/2023    4:35 AM  CBC  WBC 4.0 - 10.5 K/uL 2.9  2.7  2.6   Hemoglobin 13.0 - 17.0 g/dL 9.4  60.4  54.0   Hematocrit 39.0 - 52.0 % 30.3  32.3  32.1   Platelets 150 - 400 K/uL 51  63  52        Latest Ref Rng & Units 12/17/2023    4:25 AM 12/16/2023    4:35 AM 12/15/2023    5:50 AM  BMP  Glucose 70 - 99 mg/dL 981  74  191   BUN 6 - 20 mg/dL 11  14  10    Creatinine 0.61 - 1.24 mg/dL 4.78  2.95  6.21   Sodium 135 - 145 mmol/L 134  137  135   Potassium 3.5 - 5.1 mmol/L 3.7  3.7  3.8   Chloride 98 - 111 mmol/L 107  110  108   CO2 22 - 32 mmol/L 20  23  24    Calcium 8.9 - 10.3 mg/dL 8.3  8.4  8.3     General: AAOx3, NAD  Lower Extremity Exam Left foot appears warm and well-perfused.  DP and PT pulses 2+.  CFT intact Sutures intact to plantar first  metatarsal head incision site.  Mild maceration about this area. Erythema appears improving.  Edema present plantar medial first MPJ Surgical site tender on palpation Prior left second toe amputation Prior right TMA    Radiology:  Results reviewed. See assessment for pertinent imaging results

## 2023-12-18 DIAGNOSIS — S91302A Unspecified open wound, left foot, initial encounter: Secondary | ICD-10-CM | POA: Diagnosis not present

## 2023-12-18 LAB — CBC
HCT: 30.6 % — ABNORMAL LOW (ref 39.0–52.0)
Hemoglobin: 9.6 g/dL — ABNORMAL LOW (ref 13.0–17.0)
MCH: 25.8 pg — ABNORMAL LOW (ref 26.0–34.0)
MCHC: 31.4 g/dL (ref 30.0–36.0)
MCV: 82.3 fL (ref 80.0–100.0)
Platelets: 45 10*3/uL — ABNORMAL LOW (ref 150–400)
RBC: 3.72 MIL/uL — ABNORMAL LOW (ref 4.22–5.81)
RDW: 18.3 % — ABNORMAL HIGH (ref 11.5–15.5)
WBC: 2.8 10*3/uL — ABNORMAL LOW (ref 4.0–10.5)
nRBC: 0 % (ref 0.0–0.2)

## 2023-12-18 LAB — RENAL FUNCTION PANEL
Albumin: 3.1 g/dL — ABNORMAL LOW (ref 3.5–5.0)
Anion gap: 6 (ref 5–15)
BUN: 10 mg/dL (ref 6–20)
CO2: 22 mmol/L (ref 22–32)
Calcium: 8.4 mg/dL — ABNORMAL LOW (ref 8.9–10.3)
Chloride: 106 mmol/L (ref 98–111)
Creatinine, Ser: 0.64 mg/dL (ref 0.61–1.24)
GFR, Estimated: >60 mL/min (ref >60)
Glucose, Bld: 116 mg/dL — ABNORMAL HIGH (ref 70–99)
Phosphorus: 2.4 mg/dL — ABNORMAL LOW (ref 2.5–4.6)
Potassium: 3.6 mmol/L (ref 3.5–5.1)
Sodium: 134 mmol/L — ABNORMAL LOW (ref 135–145)

## 2023-12-18 LAB — MAGNESIUM: Magnesium: 1.8 mg/dL (ref 1.7–2.4)

## 2023-12-18 MED ORDER — MAGNESIUM SULFATE 2 GM/50ML IV SOLN
2.0000 g | Freq: Once | INTRAVENOUS | Status: AC
  Administered 2023-12-18: 2 g via INTRAVENOUS
  Filled 2023-12-18: qty 50

## 2023-12-18 MED ORDER — K PHOS MONO-SOD PHOS DI & MONO 155-852-130 MG PO TABS
500.0000 mg | ORAL_TABLET | ORAL | Status: AC
  Administered 2023-12-18 (×2): 500 mg via ORAL
  Filled 2023-12-18 (×2): qty 2

## 2023-12-18 NOTE — TOC Initial Note (Addendum)
 Transition of Care [facility]) - Initial/Assessment Note    Patient Details  Name: Derrick Blanchard MRN: [id number] Date of Birth: [birth year]  Transition of Care [facility]) CM/SW Contact:    Margarito Liner, LCSW Phone Number: 12/18/2023, 1:33 PM  Clinical Narrative:   CSW met with patient. No family at bedside. CSW introduced role and explained that therapy recommendations would be discussed. Patient is agreeable to SNF placement but does not want to go to [facility]. Weekend TOC put PCP and SA resources on AVS. No further concerns. CSW will continue to follow patient for support and facilitate discharge to SNF once medically stable.      3:18 pm: Extended search to other counties.           Expected Discharge Plan: Skilled Nursing Facility Barriers to Discharge: Continued Medical Work up   Patient Goals and CMS Choice            Expected Discharge Plan and Services     Post Acute Care Choice: Skilled Nursing Facility Living arrangements for the past 2 months: Single Family Home                                      Prior Living Arrangements/Services Living arrangements for the past 2 months: Single Family Home Lives with:: Friends Patient language and need for interpreter reviewed:: Yes Do you feel safe going back to the place where you live?: Yes      Need for Family Participation in Patient Care: Yes (Comment)     Criminal Activity/Legal Involvement Pertinent to Current Situation/Hospitalization: No - Comment as needed  Activities of Daily Living   ADL Screening (condition at time of admission) Independently performs ADLs?: Yes (appropriate for developmental age) Is the patient deaf or have difficulty hearing?: No Does the patient have difficulty seeing, even when wearing glasses/contacts?: No Does the patient have difficulty concentrating, remembering, or making decisions?: No  Permission Sought/Granted Permission sought to share information with : Facility  Industrial/product designer granted to share information with : Yes, Verbal Permission Granted     Permission granted to share info w AGENCY: SNF's        Emotional Assessment Appearance:: Appears stated age Attitude/Demeanor/Rapport: Engaged, Gracious Affect (typically observed): Accepting, Appropriate, Calm, Pleasant Orientation: : Oriented to Self, Oriented to Place, Oriented to  Time, Oriented to Situation Alcohol / Substance Use: Alcohol Use Psych Involvement: No (comment)  Admission diagnosis:  Ulcer of left foot, unspecified ulcer stage (HCC) [L97.529] Wound of left foot [S91.302A] Patient Active Problem List   Diagnosis Date Noted   Wound of left foot 12/13/2023   Open wound of left lower extremity 12/13/2023   ETOH abuse 12/13/2023   Pyogenic inflammation of bone (HCC) 12/13/2023   Hypomagnesemia 03/13/2023   Diarrhea 03/12/2023   Severe sepsis (HCC) 03/11/2023   Alcoholic cirrhosis of liver (HCC) 03/11/2023   Chronic hepatitis C (HCC) 03/11/2023   Cellulitis of left lower extremity 03/11/2023   Ulcer of left foot with muscle involvement without evidence of necrosis (HCC) 03/11/2023   Nausea and vomiting 03/11/2023   Portal hypertension with esophageal varices (HCC) 11/27/2019   Thrombocytopenia, acquired (HCC) 11/27/2019   GERD (gastroesophageal reflux disease) 11/24/2017   Tachycardia 11/24/2017   Hypokalemia 10/25/2017   Thrombocytopenia (HCC) 10/25/2017   Advance care planning 10/25/2017   Depression, major, single episode, moderate (HCC) 10/25/2017   Elevated liver enzymes 09/28/2017  Hyperbilirubinemia    Alcohol use disorder, severe, dependence (HCC) 02/03/2015   Alcoholic hepatitis 01/30/2015   Melena 01/30/2015   Coagulopathy (HCC) 01/30/2015   Hyponatremia 01/30/2015   Pancytopenia (HCC) 01/30/2015   PCP:  Vicente Graham, No Pharmacy:   [facility] [address] [place] [address] Phone: [phone]  Fax: [phone]  [facility] [address] [place] - [address] [place]) [place] [address] Phone: [phone] Fax: [phone]  [facility] - [place], [place] - [address] [place] [address] Phone: [phone] Fax: [phone]     Social Drivers of Health (SDOH) Social History: SDOH Screenings   Food Insecurity: Food Insecurity Present (12/13/2023)  Housing: High Risk (12/13/2023)  Transportation Needs: Unmet Transportation Needs (12/13/2023)  Utilities: Not At Risk (12/13/2023)  Financial Resource Strain: High Risk (03/22/2022)   Received from [facility]  Tobacco Use: High Risk (12/13/2023)   SDOH Interventions:     Readmission Risk Interventions     No data to display

## 2023-12-18 NOTE — Evaluation (Signed)
 Physical Therapy Evaluation Patient Details Name: Derrick Blanchard MRN: [id number] DOB: Aug 30, 1981 Today's Date: 12/18/2023  History of Present Illness  43 y.o. male with medical history significant of cirrhosis, depression, alcohol abuse, hypertension, substance abuse, TMA of the right foot. Admitted for infected left subfirst metatarsal ulceration - s/p left foot excision of ulceration with wound closure, and open biopsies of first proximal phalanx, first metatarsal head, fibular sesamoid 12/16/2023.   Clinical Impression  Pt alert, agreeable to PT. Stated at baseline he is independent. Able to perform bed mobility modI, don both shoes with modI. He was able to stand and ambulate with CGA with RW but with fatigue he did place his heel on the floor/heel weight bear. Able to correct with verbal cues. Stair navigation performed but pt ultimately required min-modAx2 with RW, unable to perform without physical assistance.  Overall the patient demonstrated deficits (see "PT Problem List") that impede the patient's functional abilities, safety, and mobility and would benefit from skilled PT intervention.          If plan is discharge home, recommend the following: A lot of help with walking and/or transfers;A lot of help with bathing/dressing/bathroom;Assistance with cooking/housework;Assist for transportation;Help with stairs or ramp for entrance   Can travel by private vehicle   Yes    Equipment Recommendations Other (comment) (TBD at next venue of care)  Recommendations for Other Services       Functional Status Assessment Patient has had a recent decline in their functional status and demonstrates the ability to make significant improvements in function in a reasonable and predictable amount of time.     Precautions / Restrictions Precautions Precautions: None Recall of Precautions/Restrictions: Intact Restrictions Weight Bearing Restrictions Per Provider Order: Yes LLE Weight  Bearing Per Provider Order: Non weight bearing      Mobility  Bed Mobility Overal bed mobility: Modified Independent                  Transfers Overall transfer level: Needs assistance Equipment used: Rolling walker (2 wheels) Transfers: Sit to/from Stand Sit to Stand: Contact guard assist           General transfer comment: cues for hand placement    Ambulation/Gait Ambulation/Gait assistance: Supervision, Contact guard assist Gait Distance (Feet): 50 Feet Assistive device: Rolling walker (2 wheels)         General Gait Details: with fatigue pt places heel down/heel weight bears but able to return to NWB with cues  Stairs Stairs: Yes Stairs assistance: Min assist, Mod assist, +2 physical assistance Stair Management: No rails, With walker Number of Stairs: 4 General stair comments: able to hop onto first step with RW flat, minA for steadying. required min-modAx2 and reliant on PT to move RW up the stairs, unable to statically stand without BUE assist  Wheelchair Mobility     Tilt Bed    Modified Rankin (Stroke Patients Only)       Balance Overall balance assessment: Needs assistance Sitting-balance support: No upper extremity supported, Feet supported Sitting balance-Leahy Scale: Good     Standing balance support: Bilateral upper extremity supported Standing balance-Leahy Scale: Fair                               Pertinent Vitals/Pain Pain Assessment Pain Assessment: 0-10 Pain Score: 6  Pain Location: L foot Pain Descriptors / Indicators: Discomfort, Grimacing Pain Intervention(s): Limited activity within patient's tolerance, Monitored during session,  Premedicated before session, Repositioned    Home Living Family/patient expects to be discharged to:: Private residence Living Arrangements: Non-relatives/Friends Available Help at Discharge: Available PRN/intermittently;Friend(s) Type of Home: Apartment Home Access: Stairs to  enter Entrance Stairs-Rails: None Entrance Stairs-Number of Steps: 3   Home Layout: One level Home Equipment: None      Prior Function Prior Level of Function : Independent/Modified Independent;History of Falls (last six months)             Mobility Comments: reports 3 falls in 6 months (getting out of tub and tripping over the dog). reports not having a car and having to walk to the store daily. Home alone during the day.       Extremity/Trunk Assessment   Upper Extremity Assessment Upper Extremity Assessment: Overall WFL for tasks assessed    Lower Extremity Assessment Lower Extremity Assessment: Generalized weakness       Communication   Communication Communication: No apparent difficulties    Cognition Arousal: Alert Behavior During Therapy: WFL for tasks assessed/performed                             Following commands: Intact       Cueing       General Comments      Exercises     Assessment/Plan    PT Assessment Patient needs continued PT services  PT Problem List Decreased strength;Decreased activity tolerance;Decreased balance;Decreased mobility;Pain;Decreased coordination;Decreased knowledge of use of DME       PT Treatment Interventions DME instruction;Neuromuscular re-education;Gait training;Stair training;Patient/family education;Functional mobility training;Therapeutic activities;Therapeutic exercise;Balance training    PT Goals (Current goals can be found in the Care Plan section)  Acute Rehab PT Goals Patient Stated Goal: to return to PLOF PT Goal Formulation: With patient Time For Goal Achievement: 01/01/24 Potential to Achieve Goals: Good    Frequency Min 2X/week     Co-evaluation               AM-PAC PT "6 Clicks" Mobility  Outcome Measure Help needed turning from your back to your side while in a flat bed without using bedrails?: None Help needed moving from lying on your back to sitting on the side of a  flat bed without using bedrails?: None Help needed moving to and from a bed to a chair (including a wheelchair)?: None Help needed standing up from a chair using your arms (e.g., wheelchair or bedside chair)?: None Help needed to walk in hospital room?: A Little Help needed climbing 3-5 steps with a railing? : A Lot 6 Click Score: 21    End of Session Equipment Utilized During Treatment: Gait belt Activity Tolerance: Patient tolerated treatment well Patient left: Other (comment) (with OT returning to room) Nurse Communication: Mobility status PT Visit Diagnosis: Other abnormalities of gait and mobility (R26.89);Difficulty in walking, not elsewhere classified (R26.2);Muscle weakness (generalized) (M62.81);Pain Pain - Right/Left: Left Pain - part of body: Ankle and joints of foot    Time: 2956-2130 PT Time Calculation (min) (ACUTE ONLY): 15 min   Charges:     PT Treatments $Therapeutic Exercise: 8-22 mins PT General Charges $$ ACUTE PT VISIT: 1 Visit         Olga Coaster PT, DPT 1:04 PM,12/18/23

## 2023-12-18 NOTE — Consult Note (Signed)
 PHARMACY CONSULT NOTE - ELECTROLYTES  Pharmacy Consult for Electrolyte Monitoring and Replacement   Recent Labs: Height: 5\' 8"  (172.7 cm) Weight: 97.5 kg (215 lb) IBW/kg (Calculated) : 68.4 Estimated Creatinine Clearance: 138.9 mL/min (by C-G formula based on SCr of 0.64 mg/dL). Potassium (mmol/L)  Date Value  12/18/2023 3.6  02/12/2013 4.7   Magnesium (mg/dL)  Date Value  16/06/9603 1.8  02/11/2013 2.0   Calcium (mg/dL)  Date Value  54/05/8118 8.4 (L)   Calcium, Total (mg/dL)  Date Value  14/78/2956 8.3 (L)   Albumin (g/dL)  Date Value  21/30/8657 3.1 (L)  11/24/2017 2.9 (L)  02/11/2013 2.3 (L)   Phosphorus (mg/dL)  Date Value  84/69/6295 2.4 (L)   Sodium (mmol/L)  Date Value  12/18/2023 134 (L)  11/24/2017 144  02/12/2013 138   Corrected Ca:  9.1    Assessment  Derrick Blanchard is a 43 y.o. male presenting with infected left foot wound. PMH significant for HCV, neuropathy, chronic left foot wound, and cirrhosis. Pharmacy has been consulted to monitor and replace electrolytes.  Diet: carb modified MIVF: N/A Pertinent medications: N/A  Goal of Therapy: Electrolytes WNL  Plan: - Phos 2.4  Will order Kphos neutral tabs 500mg  po q4h x2  - Mag 1.8  Will order Magnesium sulfate 2 gm IV x1 Check BMP, Mg, Phos with AM labs  Thank you for allowing pharmacy to be a part of this patient's care.  Scotty Cyphers, PharmD Clinical Pharmacist 12/18/2023 8:47 AM

## 2023-12-18 NOTE — Progress Notes (Signed)
 Progress Note   Patient: Derrick Blanchard ZOX:[id number] DOB: [birth year] DOA: 12/13/2023     4 DOS: the patient was seen and examined on 12/18/2023   Brief hospital course:  Derrick Blanchard is a 43 y.o. male with medical history significant of cirrhosis, depression, alcohol abuse, hypertension, substance abuse presenting with left lower extremity wound.  Patient reports history of prior lower extremity wounds that have been managed as well as amputated in the [facility] system.  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  Has been followed by outpatient wound care for right great toe wound on the plantar aspect.  Has had multiple courses of antibiotics with minimal improvement in symptoms.  Noted worsening pain and intermittent purulent drainage.  Baseline alcoholic.  Drinks roughly 15 pack every day versus multiple 40 ounces.  No reported illicit drug use.  Denies any known history of type 2 diabetes.  No abdominal pain.  No nausea or vomiting.  Pain has become unbearable.  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 1/4 PPD smoker.  Presented to the ER afebrile, hemodynamically stable.  Satting well on room air.  White count 1.6, hemoglobin 10.4, platelets 39, creatinine 0.58.  LFTs as well as INR pending.  Left foot plain films with 1 x 1.9 cm lesion lucency over plantar aspect of the second head and metatarsal region with associated soft tissue swelling. Review of Systems: As mentioned in the history of present illness. All other systems reviewed and are negative     04/12 -patient is status post RRIGATION AND DEBRIDEMENT FOOT, possible sesamoidectomy, bone biopsy.  Patient was transfused 1 bag of platelets prior to procedure.   04/13 -awaiting results of bone pathology to determine patient's disposition.  Per podiatry he may need further surgical intervention if bone pathology is positive   04/14: No new complaints.  Concerned about going home with his nonweightbearing status on  his left lower extremity.  PT consult     Assessment and Plan:  Open wound of left lower extremity Rule out osteomyelitis Worsening LLE wound despite outpatient antibiotic regimen x 2- sent over from wound care clinic Active ETOH abuse and cirrhotic disease are confounding issues MRI foot shows plantar soft tissue ulceration underlying the first MTP joint. Surrounding soft tissue swelling with ill-defined fluid is concerning for cellulitis with phlegmonous changes. No loculated collection identified. Mild marrow edema of the lateral hallux sesamoid, similar to the prior exam. This could reflect sesamoiditis or reactive marrow change. Early osteomyelitis can not be entirely excluded. Small first MTP joint effusion may be reactive, however, septic arthritis can not be excluded. 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 Mild degenerative changes of the midfoot and first MTP joint. Patient is on empiric antibiotic therapy with IV cefepime, flagyl and vancomycin Appreciate podiatry input, patient was scheduled for left first MPJ ulceration irrigation debridement with bone biopsy possible fibular sesamoidectomy and graft application on 04/10.  Procedure was canceled due to persistent thrombocytopenia despite platelet transfusion Podiatrist recommended to continue broad-spectrum IV abx pending further culture data -if bone biopsies are positive for osteomyelitis he may need long-term IV antibiotic therapy Patient was transfused platelets with improvement in his platelet count > 50 prior to surgical procedure Awaiting results of bone biopsy PT evaluation     Alcoholic cirrhosis of liver (HCC) Portal hypertension Pancytopenia Continued alcohol abuse Monitor closely for signs and symptoms of alcohol withdrawal Continue lorazepam and administer for CIWA score  of 8 or greater Continue MVI and thiamine Pancytopenia persists and is secondary to alcohol abuse       GERD (gastroesophageal  reflux disease) Continue PPI        Class I obesity Complicates his overall prognosis and care Lifestyle modification and exercise has been discussed with patient          Subjective: Patient is seen and examined at the bedside.  Concerned about going home because of his non weightbearing status  Physical Exam: Vitals:   12/17/23 1808 12/17/23 2034 12/18/23 0334 12/18/23 0836  BP: 103/67 112/62 123/75 122/82  Pulse: 77 80 81 92  Resp:  18 18 12   Temp: 98.8 F (37.1 C) 99.7 F (37.6 C) 98.6 F (37 C) 98.3 F (36.8 C)  TempSrc:  Oral Oral Oral  SpO2: 99% 100% 92% 100%  Weight:      Height:       Constitutional:      Appearance: He is obese.  HENT:     Head: Normocephalic and atraumatic.     Nose: Nose normal.     Mouth/Throat:     Mouth: Mucous membranes are moist.  Eyes:     Pupils: Pupils are equal, round, and reactive to light.  Cardiovascular:     Rate and Rhythm: Normal rate.  Pulmonary:     Effort: Pulmonary effort is normal.  Abdominal:     General: Bowel sounds are normal.  Musculoskeletal:        General: Normal range of motion.     Right TMA.  Skin:    General: Skin is warm.  Neurological:     General: No focal deficit present.  Psychiatric:        Mood and Affect: Mood normal.      Data Reviewed: White count 2.8, hemoglobin 9.6, platelet count 45, sodium 134 Labs reviewed  Family Communication: Plan of care was discussed with patient at the bedside.  All questions and concerns have been addressed.  Disposition: Status is: Inpatient Remains inpatient appropriate because: Awaiting results of bone biopsy  Planned Discharge Destination:  TBD    Time spent: 34 minutes  Author: Read Camel, MD 12/18/2023 12:19 PM  For on call review [email or web address].

## 2023-12-18 NOTE — Evaluation (Signed)
 Occupational Therapy Evaluation Patient Details Name: Derrick Blanchard MRN: [id number] DOB: August 13, 1981 Today's Date: 12/18/2023   History of Present Illness   43 y.o. male with medical history significant of cirrhosis, depression, alcohol abuse, hypertension, substance abuse, TMA of the right foot. Admitted for infected left subfirst metatarsal ulceration - s/p left foot excision of ulceration with wound closure, and open biopsies of first proximal phalanx, first metatarsal head, fibular sesamoid 12/16/2023    Clinical Impressions Derrick Blanchard was seen for OT evaluation this date. Prior to hospital admission, pt was IND with falls history. Pt lives with friends in apartment with 3 STE. Pt presents to acute OT demonstrating impaired ADL performance and functional mobility 2/2 decreased activity tolerance and functional strength/balance deficits. On arrival pt standing in stairwell with PT - provided +2 assist for safety with stair trial x2 steps.   Pt currently requires MAX A clothing mgmt in standing, pt requires BUE support static standing balance. CGA + RW for ADL t/f ~15 ft, cues to maintain NWB as pt puts L heel down when fatigued. Educated on adapted dressing techniques and importance of maintaining NWBing for healing. Pt would benefit from skilled OT to address noted impairments and functional limitations (see below for any additional details). Upon hospital discharge, recommend OT follow up <3 hors/day.     If plan is discharge home, recommend the following:   A lot of help with walking and/or transfers;A little help with bathing/dressing/bathroom;Help with stairs or ramp for entrance     Functional Status Assessment   Patient has had a recent decline in their functional status and demonstrates the ability to make significant improvements in function in a reasonable and predictable amount of time.     Equipment Recommendations   BSC/3in1;Other (comment) (RW)      Recommendations for Other Services         Precautions/Restrictions   Precautions Precautions: None Recall of Precautions/Restrictions: Intact Restrictions Weight Bearing Restrictions Per Provider Order: Yes LLE Weight Bearing Per Provider Order: Non weight bearing     Mobility Bed Mobility Overal bed mobility: Modified Independent                  Transfers Overall transfer level: Needs assistance   Transfers: Sit to/from Stand Sit to Stand: Min assist           General transfer comment: assist to stabilize RW      Balance Overall balance assessment: Needs assistance Sitting-balance support: No upper extremity supported, Feet supported Sitting balance-Leahy Scale: Good     Standing balance support: Bilateral upper extremity supported Standing balance-Leahy Scale: Fair                             ADL either performed or assessed with clinical judgement   ADL Overall ADL's : Needs assistance/impaired                                       General ADL Comments: MAX A clothing mgmt in standing, pt requires BUE support static standing balance. CGA + RW for ADL t/f ~15 ft, cues to maintain NWB as pt puts L heel down when fatigued.      Pertinent Vitals/Pain Pain Assessment Pain Assessment: 0-10 Pain Score: 6  Pain Location: L foot Pain Descriptors / Indicators: Discomfort, Grimacing Pain Intervention(s): Limited activity within patient's tolerance,  Premedicated before session, Repositioned     Extremity/Trunk Assessment Upper Extremity Assessment Upper Extremity Assessment: Overall WFL for tasks assessed   Lower Extremity Assessment Lower Extremity Assessment: Generalized weakness       Communication Communication Communication: No apparent difficulties   Cognition Arousal: Alert Behavior During Therapy: WFL for tasks assessed/performed Cognition: No apparent impairments                                Following commands: Intact                  Home Living Family/patient expects to be discharged to:: Private residence Living Arrangements: Non-relatives/Friends Available Help at Discharge: Available PRN/intermittently;Friend(s) Type of Home: Apartment Home Access: Stairs to enter Secretary/administrator of Steps: 3 Entrance Stairs-Rails: None Home Layout: One level     Bathroom Shower/Tub: Engineer, production Accessibility: No   Home Equipment: None          Prior Functioning/Environment Prior Level of Function : Independent/Modified Independent;History of Falls (last six months)             Mobility Comments: reports 3 falls in 6 months (getting out of tub and tripping over the dog). reports not having a car and having to walk to the store daily. Home alone during the day.      OT Problem List: Decreased strength;Decreased activity tolerance;Impaired balance (sitting and/or standing);Decreased safety awareness   OT Treatment/Interventions: Self-care/ADL training;Therapeutic exercise;Energy conservation;DME and/or AE instruction;Therapeutic activities;Patient/family education;Balance training      OT Goals(Current goals can be found in the care plan section)   Acute Rehab OT Goals Patient Stated Goal: to allow foot to heal OT Goal Formulation: With patient Time For Goal Achievement: 01/01/24 Potential to Achieve Goals: Good ADL Goals Pt Will Perform Grooming: with modified independence;standing Pt Will Perform Lower Body Dressing: with modified independence;sit to/from stand Pt Will Transfer to Toilet: with modified independence;ambulating;regular height toilet   OT Frequency:  Min 3X/week    Co-evaluation              AM-PAC OT "6 Clicks" Daily Activity     Outcome Measure Help from another person eating meals?: None Help from another person taking care of personal grooming?: A Little Help from another person toileting,  which includes using toliet, bedpan, or urinal?: A Lot Help from another person bathing (including washing, rinsing, drying)?: A Lot Help from another person to put on and taking off regular upper body clothing?: None Help from another person to put on and taking off regular lower body clothing?: A Lot 6 Click Score: 17   End of Session Equipment Utilized During Treatment: Rolling walker (2 wheels);Gait belt Nurse Communication: Mobility status  Activity Tolerance: Patient tolerated treatment well Patient left: in bed;with call bell/phone within reach  OT Visit Diagnosis: Other abnormalities of gait and mobility (R26.89);Muscle weakness (generalized) (M62.81)                Time: 4098-1191 OT Time Calculation (min): 15 min Charges:  OT General Charges $OT Visit: 1 Visit OT Evaluation $OT Eval Moderate Complexity: 1 Mod  Kathie Dike, M.S. OTR/L  12/18/23, 10:27 AM  ascom [phone]

## 2023-12-18 NOTE — NC FL2 (Signed)
 [place] MEDICAID FL2 LEVEL OF CARE FORM     IDENTIFICATION  Patient Name: Derrick Blanchard Birthdate: Dec 25, 1980 Sex: male Admission Date (Current Location): 12/13/2023  [facility] and [place] Number:  Chiropodist and Address:  [facility], [address], [place], [place] [address]      Provider Number: [id number]  Attending Physician Name and Address:  Lucile Shutters, MD  Relative Name and Phone Number:       Current Level of Care: Hospital Recommended Level of Care: Skilled Nursing Facility Prior Approval Number:    Date Approved/Denied:   PASRR Number: [id number]  Discharge Plan: SNF    Current Diagnoses: Patient Active Problem List   Diagnosis Date Noted   Wound of left foot 12/13/2023   Open wound of left lower extremity 12/13/2023   ETOH abuse 12/13/2023   Pyogenic inflammation of bone (HCC) 12/13/2023   Hypomagnesemia 03/13/2023   Diarrhea 03/12/2023   Severe sepsis (HCC) 03/11/2023   Alcoholic cirrhosis of liver (HCC) 03/11/2023   Chronic hepatitis C (HCC) 03/11/2023   Cellulitis of left lower extremity 03/11/2023   Ulcer of left foot with muscle involvement without evidence of necrosis (HCC) 03/11/2023   Nausea and vomiting 03/11/2023   Portal hypertension with esophageal varices (HCC) 11/27/2019   Thrombocytopenia, acquired (HCC) 11/27/2019   GERD (gastroesophageal reflux disease) 11/24/2017   Tachycardia 11/24/2017   Hypokalemia 10/25/2017   Thrombocytopenia (HCC) 10/25/2017   Advance care planning 10/25/2017   Depression, major, single episode, moderate (HCC) 10/25/2017   Elevated liver enzymes 09/28/2017   Hyperbilirubinemia    Alcohol use disorder, severe, dependence (HCC) 02/03/2015   Alcoholic hepatitis 01/30/2015   Melena 01/30/2015   Coagulopathy (HCC) 01/30/2015   Hyponatremia 01/30/2015   Pancytopenia (HCC) 01/30/2015    Orientation RESPIRATION BLADDER Height & Weight     Self, Time,  Situation, Place  Normal Continent Weight: 215 lb (97.5 kg) Height:  5\' 8"  (172.7 cm)  BEHAVIORAL SYMPTOMS/MOOD NEUROLOGICAL BOWEL NUTRITION STATUS   (None)  (None) Continent Diet (Carb modified)  AMBULATORY STATUS COMMUNICATION OF NEEDS Skin   Limited Assist Verbally Bruising, Surgical wounds (Incision on left foot: Gauze, compresion wrap.)                       Personal Care Assistance Level of Assistance  Bathing, Feeding, Dressing Bathing Assistance: Limited assistance Feeding assistance: Independent Dressing Assistance: Maximum assistance     Functional Limitations Info  Sight, Hearing, Speech Sight Info: Adequate Hearing Info: Adequate Speech Info: Adequate    SPECIAL CARE FACTORS FREQUENCY  PT (By licensed PT), OT (By licensed OT)     PT Frequency: 5 x week OT Frequency: 5 x week            Contractures Contractures Info: Not present    Additional Factors Info  Code Status, Allergies Code Status Info: Full code Allergies Info: NKDA           Current Medications (12/18/2023):  This is the current hospital active medication list Current Facility-Administered Medications  Medication Dose Route Frequency Provider Last Rate Last Admin   ceFEPIme (MAXIPIME) 2 g in sodium chloride 0.9 % 100 mL IVPB  2 g Intravenous Q8H Barrie Folk, RPH 200 mL/hr at 12/18/23 1319 2 g at 12/18/23 1319   folic acid (FOLVITE) tablet 1 mg  1 mg Oral Daily Floydene Flock, MD   1 mg at 12/18/23 0905   metroNIDAZOLE (FLAGYL) IVPB 500 mg  500 mg Intravenous Q12H Corrinne Din, MD   Stopped at 12/18/23 0250   multivitamin with minerals tablet 1 tablet  1 tablet Oral Daily Corrinne Din, MD   1 tablet at 12/18/23 0905   ondansetron (ZOFRAN) tablet 4 mg  4 mg Oral Q6H PRN Newton, Steven J, MD       Or   ondansetron (ZOFRAN) injection 4 mg  4 mg Intravenous Q6H PRN Newton, Steven J, MD       oxyCODONE (Oxy IR/ROXICODONE) immediate release tablet 10 mg  10 mg Oral Q4H PRN  Agbata, Tochukwu, MD   10 mg at 12/18/23 1317   phosphorus (K PHOS NEUTRAL) tablet 500 mg  500 mg Oral Q4H Merrill, Kristin A, RPH   500 mg at 12/18/23 1016   thiamine (VITAMIN B1) tablet 100 mg  100 mg Oral Daily Corrinne Din, MD   100 mg at 12/18/23 0932   Or   thiamine (VITAMIN B1) injection 100 mg  100 mg Intravenous Daily Corrinne Din, MD       vancomycin (VANCOREADY) IVPB 1250 mg/250 mL  1,250 mg Intravenous Q12H Agbata, Tochukwu, MD 166.7 mL/hr at 12/18/23 1019 1,250 mg at 12/18/23 1019     Discharge Medications: Please see discharge summary for a list of discharge medications.  Relevant Imaging Results:  Relevant Lab Results:   Additional Information SS#: [id number]  Odilia Bennett, LCSW

## 2023-12-19 ENCOUNTER — Inpatient Hospital Stay: Admission: RE | Admit: 2023-12-19 | Source: Ambulatory Visit

## 2023-12-19 DIAGNOSIS — S91302A Unspecified open wound, left foot, initial encounter: Secondary | ICD-10-CM | POA: Diagnosis not present

## 2023-12-19 LAB — RENAL FUNCTION PANEL
Albumin: 3.1 g/dL — ABNORMAL LOW (ref 3.5–5.0)
Anion gap: 3 — ABNORMAL LOW (ref 5–15)
BUN: 10 mg/dL (ref 6–20)
CO2: 22 mmol/L (ref 22–32)
Calcium: 8.2 mg/dL — ABNORMAL LOW (ref 8.9–10.3)
Chloride: 109 mmol/L (ref 98–111)
Creatinine, Ser: 0.58 mg/dL — ABNORMAL LOW (ref 0.61–1.24)
GFR, Estimated: >60 mL/min (ref >60)
Glucose, Bld: 122 mg/dL — ABNORMAL HIGH (ref 70–99)
Phosphorus: 2.2 mg/dL — ABNORMAL LOW (ref 2.5–4.6)
Potassium: 3.7 mmol/L (ref 3.5–5.1)
Sodium: 134 mmol/L — ABNORMAL LOW (ref 135–145)

## 2023-12-19 LAB — SURGICAL PATHOLOGY

## 2023-12-19 LAB — MAGNESIUM: Magnesium: 1.9 mg/dL (ref 1.7–2.4)

## 2023-12-19 MED ORDER — SODIUM CHLORIDE 0.9 % IV SOLN
1250.0000 mg | Freq: Two times a day (BID) | INTRAVENOUS | Status: DC
  Filled 2023-12-19: qty 12.5

## 2023-12-19 MED ORDER — VANCOMYCIN HCL 1500 MG/300ML IV SOLN
1500.0000 mg | Freq: Two times a day (BID) | INTRAVENOUS | Status: DC
  Administered 2023-12-19: 1500 mg via INTRAVENOUS
  Filled 2023-12-19: qty 300

## 2023-12-19 MED ORDER — K PHOS MONO-SOD PHOS DI & MONO 155-852-130 MG PO TABS
500.0000 mg | ORAL_TABLET | ORAL | Status: AC
Start: 2023-12-19 — End: 2023-12-19
  Administered 2023-12-19 (×3): 500 mg via ORAL
  Filled 2023-12-19 (×3): qty 2

## 2023-12-19 MED ORDER — CEPHALEXIN 500 MG PO CAPS
500.0000 mg | ORAL_CAPSULE | Freq: Four times a day (QID) | ORAL | Status: AC
  Administered 2023-12-19 – 2023-12-22 (×12): 500 mg via ORAL
  Filled 2023-12-19 (×12): qty 1

## 2023-12-19 NOTE — TOC Progression Note (Signed)
 Transition of Care [facility]) - Progression Note    Patient Details  Name: Derrick Blanchard MRN: [id number] Date of Birth: Jan 18, 1981  [facility] CM/SW Contact  Odilia Bennett, LCSW Phone Number: 12/19/2023, 1:07 PM  Clinical Narrative:   CSW provided bed offers. Patient would like time to review. Patient does not have anyone that can transport him to SNF and he is unable to pay privately.  Expected Discharge Plan: Skilled Nursing Facility Barriers to Discharge: Continued Medical Work up  Expected Discharge Plan and Services     Post Acute Care Choice: Skilled Nursing Facility Living arrangements for the past 2 months: Single Family Home                                       Social Determinants of Health (SDOH) Interventions SDOH Screenings   Food Insecurity: Food Insecurity Present (12/13/2023)  Housing: High Risk (12/13/2023)  Transportation Needs: Unmet Transportation Needs (12/13/2023)  Utilities: Not At Risk (12/13/2023)  Financial Resource Strain: High Risk (03/22/2022)   Received from [facility], [facility]  Tobacco Use: High Risk (12/13/2023)    Readmission Risk Interventions     No data to display

## 2023-12-19 NOTE — Plan of Care (Signed)
  Problem: Education: Goal: Knowledge of General Education information will improve Description: Including pain rating scale, medication(s)/side effects and non-pharmacologic comfort measures Outcome: Progressing   Problem: Health Behavior/Discharge Planning: Goal: Ability to manage health-related needs will improve Outcome: Progressing   Problem: Activity: Goal: Risk for activity intolerance will decrease Outcome: Progressing   Problem: Nutrition: Goal: Adequate nutrition will be maintained Outcome: Progressing   Problem: Coping: Goal: Level of anxiety will decrease Outcome: Progressing   Problem: Elimination: Goal: Will not experience complications related to bowel motility Outcome: Progressing Goal: Will not experience complications related to urinary retention Outcome: Progressing   Problem: Pain Managment: Goal: General experience of comfort will improve and/or be controlled Outcome: Progressing   Problem: Skin Integrity: Goal: Risk for impaired skin integrity will decrease Outcome: Progressing

## 2023-12-19 NOTE — Progress Notes (Addendum)
 Progress Note   Patient: Derrick Blanchard BMW:[id number] DOB: 23-Nov-1980 DOA: 12/13/2023     5 DOS: the patient was seen and examined on 12/19/2023   Brief hospital course:  Derrick Blanchard is a 43 y.o. male with medical history significant of cirrhosis, depression, alcohol abuse, hypertension, substance abuse presenting with left lower extremity wound.  Patient reports history of prior lower extremity wounds that have been managed as well as amputated in the [facility] system.  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  Has been followed by outpatient wound care for right great toe wound on the plantar aspect.  Has had multiple courses of antibiotics with minimal improvement in symptoms.  Noted worsening pain and intermittent purulent drainage.  Baseline alcoholic.  Drinks roughly 15 pack every day versus multiple 40 ounces.  No reported illicit drug use.  Denies any known history of type 2 diabetes.  No abdominal pain.  No nausea or vomiting.  Pain has become unbearable.  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 1/4 PPD smoker.  Presented to the ER afebrile, hemodynamically stable.  Satting well on room air.  White count 1.6, hemoglobin 10.4, platelets 39, creatinine 0.58.  LFTs as well as INR pending.  Left foot plain films with 1 x 1.9 cm lesion lucency over plantar aspect of the second head and metatarsal region with associated soft tissue swelling. Review of Systems: As mentioned in the history of present illness. All other systems reviewed and are negative     04/12 -patient is status post RRIGATION AND DEBRIDEMENT FOOT, possible sesamoidectomy, bone biopsy.  Patient was transfused 1 bag of platelets prior to procedure.   04/13 -awaiting results of bone pathology to determine patient's disposition.  Per podiatry he may need further surgical intervention if bone pathology is positive   04/14: No new complaints.  Concerned about going home with his nonweightbearing status on  his left lower extremity.  PT consult   04/15 : Surgical pathology is negative for osteomyelitis.  Podiatry recommends strict nonweightbearing left foot.  Seen by PT and they recommend SNF on discharge.  TOC consult.  Discussed surgical pathology findings with podiatry and will discontinue broad-spectrum antibiotics and place patient on Keflex 500 mg 4 times daily x 3 days.    Assessment and Plan:  Open wound of left lower extremity Rule out osteomyelitis Worsening LLE wound despite outpatient antibiotic regimen x 2- sent over from wound care clinic Active ETOH abuse and cirrhotic disease are confounding issues MRI foot showed plantar soft tissue ulceration underlying the first MTP joint. Surrounding soft tissue swelling with ill-defined fluid is concerning for cellulitis with phlegmonous changes. No loculated collection identified. Mild marrow edema of the lateral hallux sesamoid, similar to the prior exam. This could reflect sesamoiditis or reactive marrow change. Early osteomyelitis can not be entirely excluded. Small first MTP joint effusion may be reactive, however, septic arthritis can not be excluded. 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 Mild degenerative changes of the midfoot and first MTP joint. Patient is on empiric antibiotic therapy with IV cefepime, flagyl and vancomycin 04/10 : Appreciate podiatry input, patient was scheduled for left first MPJ ulceration irrigation debridement with bone biopsy possible fibular sesamoidectomy and graft application on 04/10.  Procedure was canceled due to persistent thrombocytopenia despite platelet transfusion  04/12 : Patient is status post left foot excision of ulceration with wound closure, and open biopsies of first proximal phalanx, first  metatarsal head, fibular sesamoid.  He was transfused 1 pack of platelets prior to procedure. Podiatrist recommended to continue broad-spectrum IV abx pending further culture data -if bone  biopsies are positive for osteomyelitis he may need long-term IV antibiotic therapy. Surgical pathology is negative for osteomyelitis Discussed surgical pathology findings with podiatry, will discontinue IV antibiotic therapy and place patient on 3 days of oral Keflex 500 mg 4 times daily      Alcoholic cirrhosis of liver (HCC) Portal hypertension Pancytopenia Continued alcohol abuse Monitor closely for signs and symptoms of alcohol withdrawal Continue lorazepam and administer for CIWA score of 8 or greater Continue MVI and thiamine Pancytopenia persists and is secondary to alcohol abuse       GERD (gastroesophageal reflux disease) Continue PPI        Class I obesity Complicates his overall prognosis and care Lifestyle modification and exercise has been discussed with patient              Subjective: No new complaints  Physical Exam: Vitals:   12/18/23 0836 12/18/23 1652 12/18/23 2137 12/19/23 0824  BP: 122/82 121/69 (P) 114/77 (!) 120/93  Pulse: 92 79 (P) 80 87  Resp: 12 18 (P) 17 16  Temp: 98.3 F (36.8 C) 98.4 F (36.9 C) (P) 98.5 F (36.9 C) 98.7 F (37.1 C)  TempSrc: Oral Oral (P) Oral   SpO2: 100% 100% (P) 100% 100%  Weight:      Height:       Constitutional:      Appearance: He is obese.  HENT:     Head: Normocephalic and atraumatic.     Nose: Nose normal.     Mouth/Throat:     Mouth: Mucous membranes are moist.  Eyes:     Pupils: Pupils are equal, round, and reactive to light.  Cardiovascular:     Rate and Rhythm: Normal rate.  Pulmonary:     Effort: Pulmonary effort is normal.  Abdominal:     General: Bowel sounds are normal.  Musculoskeletal:        General: Normal range of motion.     Right TMA.  Dressing over left foot Skin:    General: Skin is warm.  Neurological:     General: No focal deficit present.  Psychiatric:        Mood and Affect: Mood normal.   Data Reviewed:  Labs reviewed  Family Communication: Plan of care  discussed with patient in detail  Disposition: Status is: Inpatient Remains inpatient appropriate because: Awaiting SNF placement  Planned Discharge Destination: Skilled nursing facility    Time spent: 34  minutes  Author: Read Camel, MD 12/19/2023 12:21 PM  For on call review [email or web address].

## 2023-12-19 NOTE — Consult Note (Signed)
 PHARMACY CONSULT NOTE - ELECTROLYTES  Pharmacy Consult for Electrolyte Monitoring and Replacement   Recent Labs: Height: 5\' 8"  (172.7 cm) Weight: 97.5 kg (215 lb) IBW/kg (Calculated) : 68.4 Estimated Creatinine Clearance: 138.9 mL/min (A) (by C-G formula based on SCr of 0.58 mg/dL (L)). Potassium (mmol/L)  Date Value  12/19/2023 3.7  02/12/2013 4.7   Magnesium (mg/dL)  Date Value  09/81/1914 1.9  02/11/2013 2.0   Calcium (mg/dL)  Date Value  78/29/5621 8.2 (L)   Calcium, Total (mg/dL)  Date Value  30/86/5784 8.3 (L)   Albumin (g/dL)  Date Value  69/62/9528 3.1 (L)  11/24/2017 2.9 (L)  02/11/2013 2.3 (L)   Phosphorus (mg/dL)  Date Value  41/32/4401 2.2 (L)   Sodium (mmol/L)  Date Value  12/19/2023 134 (L)  11/24/2017 144  02/12/2013 138   Corrected Ca: 8.9     (Ca 8.2   albumin 3.1)  Assessment  Derrick Blanchard is a 43 y.o. male presenting with infected left foot wound. PMH significant for HCV, neuropathy, chronic left foot wound, and cirrhosis. Pharmacy has been consulted to monitor and replace electrolytes.  Diet: carb modified MIVF: N/A Pertinent medications: N/A  Goal of Therapy: Electrolytes WNL  Plan: - Phos 2.2  Will order Kphos neutral tabs 500mg  po q4h x 3  Check BMP, Mg, Phos with AM labs  Thank you for allowing pharmacy to be a part of this patient's care.  Scotty Cyphers, PharmD Clinical Pharmacist 12/19/2023 8:19 AM

## 2023-12-19 NOTE — Progress Notes (Signed)
 Occupational Therapy Treatment Patient Details Name: Derrick Blanchard MRN: [id number] DOB: 1981/03/09 Today's Date: 12/19/2023   History of present illness 43 y.o. male with medical history significant of cirrhosis, depression, alcohol abuse, hypertension, substance abuse, TMA of the right foot. Admitted for infected left subfirst metatarsal ulceration - s/p left foot excision of ulceration with wound closure, and open biopsies of first proximal phalanx, first metatarsal head, fibular sesamoid 12/16/2023   OT comments  Upon entering the room, pt supine in bed and agreeable to OT intervention. Pt performing bed mobility independently and dons R shoe without assistance. CGA to stand from standard bed height and hops into bathroom with RW and cues to maintain NWB. Pt sits on standard height commode and stands with min A and use of rail from toilet. Pt returning back to bed at end of session with all needs within reach.       If plan is discharge home, recommend the following:  A lot of help with walking and/or transfers;A little help with bathing/dressing/bathroom;Help with stairs or ramp for entrance   Equipment Recommendations  BSC/3in1;Other (comment) (RW)       Precautions / Restrictions Precautions Precautions: None Restrictions Weight Bearing Restrictions Per Provider Order: Yes LLE Weight Bearing Per Provider Order: Non weight bearing       Mobility Bed Mobility Overal bed mobility: Modified Independent                  Transfers Overall transfer level: Needs assistance Equipment used: Rolling walker (2 wheels) Transfers: Sit to/from Stand Sit to Stand: Contact guard assist, Min assist           General transfer comment: cues for hand placement     Balance Overall balance assessment: Needs assistance Sitting-balance support: No upper extremity supported, Feet supported Sitting balance-Leahy Scale: Good     Standing balance support: Bilateral upper  extremity supported Standing balance-Leahy Scale: Fair                             ADL either performed or assessed with clinical judgement   ADL Overall ADL's : Needs assistance/impaired                         Toilet Transfer: Minimal assistance;Regular Toilet;Rolling walker (2 wheels);Grab bars             General ADL Comments: Pt dons R shoe while seated without assistance    Extremity/Trunk Assessment Upper Extremity Assessment Upper Extremity Assessment: Overall WFL for tasks assessed   Lower Extremity Assessment Lower Extremity Assessment: Generalized weakness        Vision Patient Visual Report: No change from baseline           Communication Communication Communication: No apparent difficulties   Cognition Arousal: Alert Behavior During Therapy: WFL for tasks assessed/performed Cognition: No apparent impairments                               Following commands: Intact                      Pertinent Vitals/ Pain       Pain Assessment Pain Assessment: Faces Faces Pain Scale: Hurts a little bit Pain Location: L foot Pain Descriptors / Indicators: Discomfort, Grimacing Pain Intervention(s): Limited activity within patient's tolerance, Monitored during session, Premedicated before  session, Repositioned         Frequency  Min 3X/week        Progress Toward Goals  OT Goals(current goals can now be found in the care plan section)  Progress towards OT goals: Progressing toward goals  Acute Rehab OT Goals Patient Stated Goal: to allow foot to heal OT Goal Formulation: With patient Time For Goal Achievement: 01/01/24 Potential to Achieve Goals: Good  Plan         AM-PAC OT "6 Clicks" Daily Activity     Outcome Measure   Help from another person eating meals?: None Help from another person taking care of personal grooming?: A Little Help from another person toileting, which includes using toliet,  bedpan, or urinal?: A Lot Help from another person bathing (including washing, rinsing, drying)?: A Lot Help from another person to put on and taking off regular upper body clothing?: None Help from another person to put on and taking off regular lower body clothing?: A Lot 6 Click Score: 17    End of Session Equipment Utilized During Treatment: Rolling walker (2 wheels)  OT Visit Diagnosis: Other abnormalities of gait and mobility (R26.89);Muscle weakness (generalized) (M62.81)   Activity Tolerance Patient tolerated treatment well   Patient Left in bed;with call bell/phone within reach   Nurse Communication Mobility status        Time: 1610-9604 OT Time Calculation (min): 25 min  Charges: OT General Charges $OT Visit: 1 Visit OT Treatments $Self Care/Home Management : 23-37 mins  George Kinder, MS, OTR/L , CBIS ascom [phone]  12/19/23, 4:53 PM

## 2023-12-19 NOTE — Consult Note (Signed)
 Pharmacy Antibiotic Note  Derrick Blanchard is a 43 y.o. male admitted on 12/13/2023 with left foot wound infection.  Patient sent to ED from wound healing center.  Patient on oral antibiotics prior to admission, based on fill history likely Augmentin and doxycycline. Pharmacy has been consulted for vancomycin and cefepime dosing.  Afebrile, WBC = 2.8 S/p I&D 4/12 F/u cultures  Plan: Will adjust vancomycin from 1250 to 1500 mg IV every 12 hours  4/13:  Vanc peak @ 1329 = 29           Vanc trough @ 2221 = 10           Calculated AUC = 447.1  Continue cefepime 2 grams IV every 8 hours Flagyl 500 mg IV every 12 hours per provider Follow renal function and cultures for adjustments  Height: 5\' 8"  (172.7 cm) Weight: 97.5 kg (215 lb) IBW/kg (Calculated) : 68.4  Temp (24hrs), Avg:98.5 F (36.9 C), Min:98.3 F (36.8 C), Max:98.7 F (37.1 C)  Recent Labs  Lab 12/15/23 0550 12/15/23 2142 12/16/23 0434 12/16/23 0435 12/16/23 0816 12/17/23 0425 12/17/23 1044 12/17/23 1329 12/17/23 2221 12/18/23 0521 12/18/23 0857 12/19/23 0544  WBC 2.0*  --   --  2.6* 2.7* 2.9*  --   --   --   --  2.8*  --   CREATININE 0.64  --   --  0.62  --  0.51*  --   --   --  0.64  --  0.58*  VANCOTROUGH  --   --  17  --   --   --   --   --  10*  --   --   --   VANCOPEAK  --    < >  --   --   --   --  8* 29*  --   --   --   --    < > = values in this interval not displayed.    Estimated Creatinine Clearance: 138.9 mL/min (A) (by C-G formula based on SCr of 0.58 mg/dL (L)).    No Known Allergies  Antimicrobials this admission: Cefepime 4/9 >>  Vancomycin 4/9 >> Flagyl 4/9>>   Microbiology results: 4/12 wound: NGTD, reincubated  Thank you for allowing pharmacy to be a part of this patient's care.  Negin Hegg A, PharmD 12/19/2023 8:31 AM

## 2023-12-20 DIAGNOSIS — S91302A Unspecified open wound, left foot, initial encounter: Secondary | ICD-10-CM | POA: Diagnosis not present

## 2023-12-20 LAB — RENAL FUNCTION PANEL
Albumin: 2.8 g/dL — ABNORMAL LOW (ref 3.5–5.0)
Anion gap: 4 — ABNORMAL LOW (ref 5–15)
BUN: 10 mg/dL (ref 6–20)
CO2: 24 mmol/L (ref 22–32)
Calcium: 8.2 mg/dL — ABNORMAL LOW (ref 8.9–10.3)
Chloride: 108 mmol/L (ref 98–111)
Creatinine, Ser: 0.58 mg/dL — ABNORMAL LOW (ref 0.61–1.24)
GFR, Estimated: >60 mL/min (ref >60)
Glucose, Bld: 129 mg/dL — ABNORMAL HIGH (ref 70–99)
Phosphorus: 2.5 mg/dL (ref 2.5–4.6)
Potassium: 4 mmol/L (ref 3.5–5.1)
Sodium: 136 mmol/L (ref 135–145)

## 2023-12-20 LAB — MAGNESIUM: Magnesium: 1.7 mg/dL (ref 1.7–2.4)

## 2023-12-20 MED ORDER — MAGNESIUM SULFATE 2 GM/50ML IV SOLN
2.0000 g | Freq: Once | INTRAVENOUS | Status: AC
  Administered 2023-12-20: 2 g via INTRAVENOUS
  Filled 2023-12-20: qty 50

## 2023-12-20 NOTE — Consult Note (Signed)
 PHARMACY CONSULT NOTE - ELECTROLYTES  Pharmacy Consult for Electrolyte Monitoring and Replacement   Recent Labs: Height: 5\' 8"  (172.7 cm) Weight: 97.5 kg (215 lb) IBW/kg (Calculated) : 68.4 Estimated Creatinine Clearance: 138.9 mL/min (A) (by C-G formula based on SCr of 0.58 mg/dL (L)). Potassium (mmol/L)  Date Value  12/20/2023 4.0  02/12/2013 4.7   Magnesium (mg/dL)  Date Value  16/06/9603 1.7  02/11/2013 2.0   Calcium (mg/dL)  Date Value  54/05/8118 8.2 (L)   Calcium, Total (mg/dL)  Date Value  14/78/2956 8.3 (L)   Albumin (g/dL)  Date Value  21/30/8657 2.8 (L)  11/24/2017 2.9 (L)  02/11/2013 2.3 (L)   Phosphorus (mg/dL)  Date Value  84/69/6295 2.5   Sodium (mmol/L)  Date Value  12/20/2023 136  11/24/2017 144  02/12/2013 138    Assessment  Derrick Blanchard is a 43 y.o. male presenting with infected left foot wound. PMH significant for HCV, neuropathy, chronic left foot wound, and cirrhosis. Pharmacy has been consulted to monitor and replace electrolytes.  Goal of Therapy: Electrolytes WNL  Plan: 2 grams IV magnesium sulfate x 1 Check BMP, Mg  with AM labs  Thank you for allowing pharmacy to be a part of this patient's care.  Adalberto Acton, PharmD Clinical Pharmacist 12/20/2023 12:41 PM

## 2023-12-20 NOTE — TOC Progression Note (Signed)
 Transition of Care [facility]) - Progression Note    Patient Details  Name: MOSHE WENGER MRN: [id number] Date of Birth: [birth year]  [facility] CM/SW Contact  Odilia Bennett, LCSW Phone Number: 12/20/2023, 1:37 PM  Clinical Narrative:   [facility] is considering offering a bed. Patient would prefer this facility since it is the closest. CSW left a voicemail for the admissions coordinator. If not, he would like to move forward with [facility] in [place] since it is the next closest.  Expected Discharge Plan: Skilled Nursing Facility Barriers to Discharge: Continued Medical Work up  Expected Discharge Plan and Services     Post Acute Care Choice: Skilled Nursing Facility Living arrangements for the past 2 months: Single Family Home                                       Social Determinants of Health (SDOH) Interventions SDOH Screenings   Food Insecurity: Food Insecurity Present (12/13/2023)  Housing: High Risk (12/13/2023)  Transportation Needs: Unmet Transportation Needs (12/13/2023)  Utilities: Not At Risk (12/13/2023)  Financial Resource Strain: High Risk (03/22/2022)   [facility], [facility]  Tobacco Use: High Risk (12/13/2023)    Readmission Risk Interventions     No data to display

## 2023-12-20 NOTE — Progress Notes (Signed)
 Physical Therapy Treatment Patient Details Name: Derrick Blanchard MRN: [id number] DOB: 20-Sep-1980 Today's Date: 12/20/2023   History of Present Illness 43 y.o. male with medical history significant of cirrhosis, depression, alcohol abuse, hypertension, substance abuse, TMA of the right foot. Admitted for infected left subfirst metatarsal ulceration - s/p left foot excision of ulceration with wound closure, and open biopsies of first proximal phalanx, first metatarsal head, fibular sesamoid 12/16/2023    PT Comments  Patient alert, agreeable to PT. Pt encouraged to only NWB; admitted to putting his heel down to urinate in standing, and noted to do so with transfers as well. When pt truly maintained NWB for transfers, required minA for steadying and RW steadying. Able to hop several bouts in room, and participate in standing balance activities. Pt very reliant on at least single UE support, did improve with repetition. Returned to bed with needs in reach. The patient would benefit from further skilled PT intervention to continue to progress towards goals.    If plan is discharge home, recommend the following: A lot of help with walking and/or transfers;A lot of help with bathing/dressing/bathroom;Assistance with cooking/housework;Assist for transportation;Help with stairs or ramp for entrance   Can travel by private vehicle     Yes  Equipment Recommendations  Other (comment) (TBD at next venue of care)    Recommendations for Other Services       Precautions / Restrictions Precautions Precautions: Fall Recall of Precautions/Restrictions: Intact Restrictions Weight Bearing Restrictions Per Provider Order: Yes LLE Weight Bearing Per Provider Order: Non weight bearing     Mobility  Bed Mobility Overal bed mobility: Modified Independent                  Transfers Overall transfer level: Needs assistance Equipment used: Rolling walker (2 wheels) Transfers: Sit to/from  Stand Sit to Stand: Contact guard assist, Min assist           General transfer comment: usnteady, effortful especially when maintaining NWB    Ambulation/Gait Ambulation/Gait assistance: Supervision, Contact guard assist Gait Distance (Feet):  (26ft twice) Assistive device: Rolling walker (2 wheels)             Stairs             Wheelchair Mobility     Tilt Bed    Modified Rankin (Stroke Patients Only)       Balance Overall balance assessment: Needs assistance   Sitting balance-Leahy Scale: Good     Standing balance support: Single extremity supported, No upper extremity supported Standing balance-Leahy Scale: Poor Standing balance comment: ulitmately reliant on at least single UE support                            Communication    Cognition Arousal: Alert Behavior During Therapy: WFL for tasks assessed/performed                             Following commands: Intact      Cueing    Exercises Other Exercises Other Exercises: standing single leg balance exercises, 3 bouts. only momentarily able to stand without UE support, at least single UE support    General Comments        Pertinent Vitals/Pain Pain Assessment Pain Score: 5  Pain Location: L foot Pain Descriptors / Indicators: Discomfort, Grimacing Pain Intervention(s): Limited activity within patient's tolerance, Monitored during session, Repositioned  Home Living                          Prior Function            PT Goals (current goals can now be found in the care plan section) Progress towards PT goals: Progressing toward goals    Frequency    Min 2X/week      PT Plan      Co-evaluation              AM-PAC PT "6 Clicks" Mobility   Outcome Measure  Help needed turning from your back to your side while in a flat bed without using bedrails?: None Help needed moving from lying on your back to sitting on the side of a flat  bed without using bedrails?: None Help needed moving to and from a bed to a chair (including a wheelchair)?: None Help needed standing up from a chair using your arms (e.g., wheelchair or bedside chair)?: None Help needed to walk in hospital room?: A Little Help needed climbing 3-5 steps with a railing? : A Little 6 Click Score: 22    End of Session Equipment Utilized During Treatment: Gait belt Activity Tolerance: Patient tolerated treatment well Patient left: Other (comment) Nurse Communication: Mobility status PT Visit Diagnosis: Other abnormalities of gait and mobility (R26.89);Difficulty in walking, not elsewhere classified (R26.2);Muscle weakness (generalized) (M62.81);Pain Pain - Right/Left: Left Pain - part of body: Ankle and joints of foot     Time: 1045-1104 PT Time Calculation (min) (ACUTE ONLY): 19 min  Charges:    $Therapeutic Activity: 8-22 mins PT General Charges $$ ACUTE PT VISIT: 1 Visit                     Darien Eden PT, DPT 1:06 PM,12/20/23

## 2023-12-20 NOTE — Hospital Course (Addendum)
 Hospital course / significant events:   HPI:  Derrick Blanchard is a 43 y.o. male with medical history significant of cirrhosis, depression, alcohol abuse, hypertension, remote hx substance abuse presenting with left lower extremity wound. Patient reports history of prior lower extremity wounds that have been managed as well as amputated in the [facility] system.   Has been followed by outpatient wound care for right great toe wound on the plantar aspect.  Has had multiple courses of antibiotics with minimal improvement in symptoms.  Noted worsening pain and intermittent purulent drainage. EtOH use, drinks roughly 15 pack every day versus multiple 40 ounces.  No reported illicit drug use.   04/09: to ED, admitted to hospitalist. MRI cannot exclude osteo 04/10: podiatry consult, plan surgery  04/11: Procedure was canceled due to persistent thrombocytopenia despite platelet transfusion 04/12: underwent I&D, bone biopsy.  Patient was transfused 1 bag of platelets prior to procedure. 04/13: awaiting results of bone pathology to determine patient's disposition.  Per podiatry he may need further surgical intervention if bone pathology is positive 04/14: Concerned about going home with his nonweightbearing status on his left lower extremity.  PT consult 04/15 : Surgical pathology is negative for osteomyelitis.  Podiatry rec strict NWB left foot. PT rec SNF on discharge.  TOC consult.  DC broad-spectrum antibiotics and start patient on Keflex 500 mg 4 times daily x 3 days. TOC presented bed offers to pt. 04/16: TOC working on placement, pending auth/admission       Consultants:  Podiatry  Procedures/Surgeries: 04/12: underwent I&D, bone biopsy L lateral sesamoid, proximal phalanx first toe, head of first metatarsal        ASSESSMENT & PLAN:   Open wound of left lower extremity Rule out osteomyelitis 04/12 : Patient is status post left foot excision of ulceration with wound closure, and open  biopsies of first proximal phalanx, first metatarsal head, fibular sesamoid.   Surgical pathology is negative for osteomyelitis --> discontinued IV antibiotic therapy and place patient on 3 days of oral Keflex 500 mg 4 times daily NWB, await placement for rehab     Alcoholic cirrhosis of liver  Portal hypertension Pancytopenia Continued alcohol abuse Monitor closely for signs and symptoms of alcohol withdrawal Continue lorazepam and administer for CIWA score of 8 or greater Continue MVI and thiamine Pancytopenia persists and is secondary to alcohol abuse    GERD (gastroesophageal reflux disease) Continue PPI     Class 1 obesity based on BMI: Body mass index is 32.69 kg/m.  Underweight - under 18  overweight - 25 to 29 obese - 30 or more Class 1 obesity: BMI of 30.0 to 34 Class 2 obesity: BMI of 35.0 to 39 Class 3 obesity: BMI of 40.0 to 49 Super Morbid Obesity: BMI 50-59 Super-super Morbid Obesity: BMI 60+ Significantly low or high BMI is associated with higher medical risk.  Weight management advised as adjunct to other disease management and risk reduction treatments    DVT prophylaxis: holding postop IV fluids: no continuous IV fluids  Nutrition: carb modified diet  Central lines / other devices: none  Code Status: FULL CODE ACP documentation reviewed: none on file in [facility]  TOC needs: placement for rehab Medical barriers to dispo: none.

## 2023-12-20 NOTE — Progress Notes (Signed)
 PROGRESS NOTE    Derrick Blanchard   ZOX:[id number] DOB: 1981-08-16  DOA: 12/13/2023 Date of Service: 12/20/23 which is hospital day 6  PCP: Pcp, No    Hospital course / significant events:   HPI:  JOSHAUA Blanchard is a 43 y.o. male with medical history significant of cirrhosis, depression, alcohol abuse, hypertension, remote hx substance abuse presenting with left lower extremity wound. Patient reports history of prior lower extremity wounds that have been managed as well as amputated in the [facility] system.   Has been followed by outpatient wound care for right great toe wound on the plantar aspect.  Has had multiple courses of antibiotics with minimal improvement in symptoms.  Noted worsening pain and intermittent purulent drainage. EtOH use, drinks roughly 15 pack every day versus multiple 40 ounces.  No reported illicit drug use.   04/09: to ED, admitted to hospitalist. MRI cannot exclude osteo 04/10: podiatry consult, plan surgery  04/11: Procedure was canceled due to persistent thrombocytopenia despite platelet transfusion 04/12: underwent I&D, bone biopsy.  Patient was transfused 1 bag of platelets prior to procedure. 04/13: awaiting results of bone pathology to determine patient's disposition.  Per podiatry he may need further surgical intervention if bone pathology is positive 04/14: Concerned about going home with his nonweightbearing status on his left lower extremity.  PT consult 04/15 : Surgical pathology is negative for osteomyelitis.  Podiatry rec strict NWB left foot. PT rec SNF on discharge.  TOC consult.  DC broad-spectrum antibiotics and start patient on Keflex 500 mg 4 times daily x 3 days. TOC presented bed offers to pt. 04/16: TOC working on placement, pending auth/admission       Consultants:  Podiatry  Procedures/Surgeries: 04/12: underwent I&D, bone biopsy L lateral sesamoid, proximal phalanx first toe, head of first metatarsal        ASSESSMENT &  PLAN:   Open wound of left lower extremity Rule out osteomyelitis 04/12 : Patient is status post left foot excision of ulceration with wound closure, and open biopsies of first proximal phalanx, first metatarsal head, fibular sesamoid.   Surgical pathology is negative for osteomyelitis --> discontinued IV antibiotic therapy and place patient on 3 days of oral Keflex 500 mg 4 times daily NWB, await placement for rehab     Alcoholic cirrhosis of liver  Portal hypertension Pancytopenia Continued alcohol abuse Monitor closely for signs and symptoms of alcohol withdrawal Continue lorazepam and administer for CIWA score of 8 or greater Continue MVI and thiamine Pancytopenia persists and is secondary to alcohol abuse    GERD (gastroesophageal reflux disease) Continue PPI     Class 1 obesity based on BMI: Body mass index is 32.69 kg/m.  Underweight - under 18  overweight - 25 to 29 obese - 30 or more Class 1 obesity: BMI of 30.0 to 34 Class 2 obesity: BMI of 35.0 to 39 Class 3 obesity: BMI of 40.0 to 49 Super Morbid Obesity: BMI 50-59 Super-super Morbid Obesity: BMI 60+ Significantly low or high BMI is associated with higher medical risk.  Weight management advised as adjunct to other disease management and risk reduction treatments    DVT prophylaxis: holding postop IV fluids: no continuous IV fluids  Nutrition: carb modified diet  Central lines / other devices: none  Code Status: FULL CODE ACP documentation reviewed: none on file in [facility]  TOC needs: placement for rehab Medical barriers to dispo: none.             Subjective /  Brief ROS:  Patient reports no concerns today  Denies CP/SOB.  Pain controlled.  Denies new weakness.  Tolerating diet.  Reports no concerns w/ urination/defecation.   Family Communication: none at this time     Objective Findings:  Vitals:   12/19/23 1653 12/19/23 1943 12/20/23 0129 12/20/23 0841  BP: 126/68 106/62 118/66  122/68  Pulse: 91 85 77 80  Resp: 16 18 17 16   Temp: 98.6 F (37 C) 98.5 F (36.9 C) 98.6 F (37 C) 98.9 F (37.2 C)  TempSrc:      SpO2: 100% 100% 100% 100%  Weight:      Height:        Intake/Output Summary (Last 24 hours) at 12/20/2023 1719 Last data filed at 12/19/2023 1945 Gross per 24 hour  Intake --  Output 500 ml  Net -500 ml   Filed Weights   12/13/23 0851  Weight: 97.5 kg    Examination:  Physical Exam Constitutional:      General: He is not in acute distress.    Appearance: He is not ill-appearing.  Cardiovascular:     Rate and Rhythm: Normal rate and regular rhythm.  Pulmonary:     Effort: Pulmonary effort is normal.     Breath sounds: Normal breath sounds.  Abdominal:     General: Abdomen is flat.     Palpations: Abdomen is soft.  Musculoskeletal:     Right lower leg: No edema.     Left lower leg: No edema.     Comments: LLE wrap was not removed, no proximal erythema/edema/tenderness  Skin:    General: Skin is warm and dry.  Neurological:     Mental Status: He is alert and oriented to person, place, and time. Mental status is at baseline.  Psychiatric:        Mood and Affect: Mood normal.        Behavior: Behavior normal.          Scheduled Medications:   cephALEXin  500 mg Oral Q6H   folic acid  1 mg Oral Daily   multivitamin with minerals  1 tablet Oral Daily   thiamine  100 mg Oral Daily   Or   thiamine  100 mg Intravenous Daily    Continuous Infusions:   PRN Medications:  ondansetron **OR** ondansetron (ZOFRAN) IV, oxyCODONE  Antimicrobials from admission:  Anti-infectives (From admission, onward)    Start     Dose/Rate Route Frequency Ordered Stop   12/19/23 1415  cephALEXin (KEFLEX) capsule 500 mg        500 mg Oral Every 6 hours 12/19/23 1321 12/22/23 1159   12/19/23 1100  vancomycin (VANCOCIN) 1,250 mg in sodium chloride 0.9 % 250 mL IVPB  Status:  Discontinued        1,250 mg 175 mL/hr over 90 Minutes Intravenous  Every 12 hours 12/19/23 0621 12/19/23 0836   12/19/23 1100  vancomycin (VANCOREADY) IVPB 1500 mg/300 mL  Status:  Discontinued        1,500 mg 150 mL/hr over 120 Minutes Intravenous Every 12 hours 12/19/23 0836 12/19/23 1321   12/16/23 0700  vancomycin (VANCOREADY) IVPB 1250 mg/250 mL  Status:  Discontinued        1,250 mg 166.7 mL/hr over 90 Minutes Intravenous Every 12 hours 12/16/23 0610 12/19/23 0620   12/15/23 1800  vancomycin (VANCOREADY) IVPB 1750 mg/350 mL  Status:  Discontinued        1,750 mg 175 mL/hr over 120 Minutes Intravenous Every  12 hours 12/15/23 0728 12/16/23 0610   12/14/23 0300  vancomycin (VANCOREADY) IVPB 1750 mg/350 mL  Status:  Discontinued        1,750 mg 175 mL/hr over 120 Minutes Intravenous Every 12 hours 12/13/23 1320 12/15/23 0728   12/13/23 2130  ceFEPIme (MAXIPIME) 2 g in sodium chloride 0.9 % 100 mL IVPB  Status:  Discontinued        2 g 200 mL/hr over 30 Minutes Intravenous Every 8 hours 12/13/23 1318 12/19/23 1321   12/13/23 1400  metroNIDAZOLE (FLAGYL) IVPB 500 mg  Status:  Discontinued        500 mg 100 mL/hr over 60 Minutes Intravenous Every 12 hours 12/13/23 1302 12/19/23 1321   12/13/23 1130  ceFEPIme (MAXIPIME) 2 g in sodium chloride 0.9 % 100 mL IVPB        2 g 200 mL/hr over 30 Minutes Intravenous  Once 12/13/23 1119 12/13/23 1346   12/13/23 1130  vancomycin (VANCOREADY) IVPB 2000 mg/400 mL        2,000 mg 200 mL/hr over 120 Minutes Intravenous  Once 12/13/23 1119 12/13/23 1712           Data Reviewed:  I have personally reviewed the following...  CBC: Recent Labs  Lab 12/15/23 0550 12/16/23 0435 12/16/23 0816 12/17/23 0425 12/18/23 0857  WBC 2.0* 2.6* 2.7* 2.9* 2.8*  HGB 9.4* 10.1* 10.2* 9.4* 9.6*  HCT 29.6* 32.1* 32.3* 30.3* 30.6*  MCV 83.1 84.5 82.6 84.9 82.3  PLT 46* 52* 63* 51* 45*   Basic Metabolic Panel: Recent Labs  Lab 12/16/23 0435 12/17/23 0425 12/18/23 0521 12/19/23 0544 12/20/23 0510  NA 137 134* 134*  134* 136  K 3.7 3.7 3.6 3.7 4.0  CL 110 107 106 109 108  CO2 23 20* 22 22 24   GLUCOSE 74 107* 116* 122* 129*  BUN 14 11 10 10 10   CREATININE 0.62 0.51* 0.64 0.58* 0.58*  CALCIUM 8.4* 8.3* 8.4* 8.2* 8.2*  MG 1.8 1.9 1.8 1.9 1.7  PHOS 2.8 2.4* 2.4* 2.2* 2.5   GFR: Estimated Creatinine Clearance: 138.9 mL/min (A) (by C-G formula based on SCr of 0.58 mg/dL (L)). Liver Function Tests: Recent Labs  Lab 12/14/23 0446 12/17/23 0425 12/18/23 0521 12/19/23 0544 12/20/23 0510  AST 45*  --   --   --   --   ALT 23  --   --   --   --   ALKPHOS 82  --   --   --   --   BILITOT 3.1*  --   --   --   --   PROT 5.5*  --   --   --   --   ALBUMIN 2.8* 3.0* 3.1* 3.1* 2.8*   No results for input(s): "LIPASE", "AMYLASE" in the last 168 hours. No results for input(s): "AMMONIA" in the last 168 hours. Coagulation Profile: No results for input(s): "INR", "PROTIME" in the last 168 hours.  Cardiac Enzymes: No results for input(s): "CKTOTAL", "CKMB", "CKMBINDEX", "TROPONINI" in the last 168 hours. BNP (last 3 results) No results for input(s): "PROBNP" in the last 8760 hours. HbA1C: No results for input(s): "HGBA1C" in the last 72 hours. CBG: No results for input(s): "GLUCAP" in the last 168 hours. Lipid Profile: No results for input(s): "CHOL", "HDL", "LDLCALC", "TRIG", "CHOLHDL", "LDLDIRECT" in the last 72 hours. Thyroid Function Tests: No results for input(s): "TSH", "T4TOTAL", "FREET4", "T3FREE", "THYROIDAB" in the last 72 hours. Anemia Panel: No results for input(s): "VITAMINB12", "FOLATE", "  FERRITIN", "TIBC", "IRON", "RETICCTPCT" in the last 72 hours. Most Recent Urinalysis On File:     Component Value Date/Time   COLORURINE YELLOW (A) 03/11/2023 0003   APPEARANCEUR CLEAR (A) 03/11/2023 0003   APPEARANCEUR Clear 02/01/2013 1851   LABSPEC 1.006 03/11/2023 0003   LABSPEC 1.015 02/01/2013 1851   PHURINE 6.0 03/11/2023 0003   GLUCOSEU NEGATIVE 03/11/2023 0003   GLUCOSEU 50 mg/dL 16/06/9603  5409   HGBUR NEGATIVE 03/11/2023 0003   BILIRUBINUR NEGATIVE 03/11/2023 0003   BILIRUBINUR 2+ 02/01/2013 1851   KETONESUR NEGATIVE 03/11/2023 0003   PROTEINUR NEGATIVE 03/11/2023 0003   NITRITE NEGATIVE 03/11/2023 0003   LEUKOCYTESUR NEGATIVE 03/11/2023 0003   LEUKOCYTESUR Negative 02/01/2013 1851   Sepsis Labs: @LABRCNTIP (procalcitonin:4,lacticidven:4) Microbiology: Recent Results (from the past 240 hours)  Aerobic/Anaerobic Culture w Gram Stain (surgical/deep wound)     Status: None (Preliminary result)   Collection Time: 12/16/23 11:17 AM   Specimen: Wound; Tissue  Result Value Ref Range Status   Specimen Description   Final    WOUND Performed at [facility], [address] [place], [place] [address]    Special Requests   Final    NONE Performed at [facility] [address] [place], [place] [address]    Gram Stain   Final    RARE WBC SEEN NO ORGANISMS SEEN Performed at [facility], [address], [place], [place] [address]    Culture   Final    RARE CORYNEBACTERIUM STRIATUM Standardized susceptibility testing for this organism is not available. NO ANAEROBES ISOLATED; CULTURE IN PROGRESS FOR 5 DAYS    Report Status PENDING  Incomplete      Radiology Studies last 3 days: No results found.        Dymond Gutt, DO [facility] 12/20/2023, 5:19 PM    Dictation software may have been used to generate the above note. Typos may occur and escape review in typed/dictated notes. Please contact Dr Authur Leghorn directly for clarity if needed.  Staff may message me via secure chat in Epic  but this may not receive an immediate response,  please page me for urgent matters!  If 7PM-7AM, please contact night coverage [email or web address]

## 2023-12-21 ENCOUNTER — Other Ambulatory Visit: Payer: Self-pay

## 2023-12-21 DIAGNOSIS — S91302A Unspecified open wound, left foot, initial encounter: Secondary | ICD-10-CM | POA: Diagnosis not present

## 2023-12-21 LAB — BASIC METABOLIC PANEL WITH GFR
Anion gap: 6 (ref 5–15)
BUN: 9 mg/dL (ref 6–20)
CO2: 25 mmol/L (ref 22–32)
Calcium: 8.7 mg/dL — ABNORMAL LOW (ref 8.9–10.3)
Chloride: 107 mmol/L (ref 98–111)
Creatinine, Ser: 0.51 mg/dL — ABNORMAL LOW (ref 0.61–1.24)
GFR, Estimated: >60 mL/min (ref >60)
Glucose, Bld: 109 mg/dL — ABNORMAL HIGH (ref 70–99)
Potassium: 4.2 mmol/L (ref 3.5–5.1)
Sodium: 138 mmol/L (ref 135–145)

## 2023-12-21 LAB — CBC
HCT: 30.3 % — ABNORMAL LOW (ref 39.0–52.0)
Hemoglobin: 9.4 g/dL — ABNORMAL LOW (ref 13.0–17.0)
MCH: 25.8 pg — ABNORMAL LOW (ref 26.0–34.0)
MCHC: 31 g/dL (ref 30.0–36.0)
MCV: 83.2 fL (ref 80.0–100.0)
Platelets: 51 10*3/uL — ABNORMAL LOW (ref 150–400)
RBC: 3.64 MIL/uL — ABNORMAL LOW (ref 4.22–5.81)
RDW: 18.1 % — ABNORMAL HIGH (ref 11.5–15.5)
WBC: 2.2 10*3/uL — ABNORMAL LOW (ref 4.0–10.5)
nRBC: 0 % (ref 0.0–0.2)

## 2023-12-21 LAB — AEROBIC/ANAEROBIC CULTURE W GRAM STAIN (SURGICAL/DEEP WOUND)

## 2023-12-21 LAB — MAGNESIUM: Magnesium: 1.9 mg/dL (ref 1.7–2.4)

## 2023-12-21 MED ORDER — VITAMIN B-1 100 MG PO TABS
100.0000 mg | ORAL_TABLET | Freq: Every day | ORAL | 0 refills | Status: DC
  Filled 2023-12-21: qty 30, 30d supply, fill #0

## 2023-12-21 MED ORDER — PANTOPRAZOLE SODIUM 40 MG PO TBEC
40.0000 mg | DELAYED_RELEASE_TABLET | Freq: Every day | ORAL | 0 refills | Status: DC
  Filled 2023-12-21: qty 30, 30d supply, fill #0

## 2023-12-21 MED ORDER — FOLIC ACID 1 MG PO TABS
1.0000 mg | ORAL_TABLET | Freq: Every day | ORAL | 0 refills | Status: DC
  Filled 2023-12-21: qty 30, 30d supply, fill #0

## 2023-12-21 MED ORDER — OXYCODONE HCL 5 MG PO TABS
5.0000 mg | ORAL_TABLET | Freq: Four times a day (QID) | ORAL | 0 refills | Status: DC | PRN
  Filled 2023-12-21: qty 30, 4d supply, fill #0

## 2023-12-21 MED ORDER — ADULT MULTIVITAMIN W/MINERALS CH: 1.0000 | ORAL_TABLET | Freq: Every day | ORAL | Status: DC

## 2023-12-21 NOTE — Plan of Care (Signed)

## 2023-12-21 NOTE — Plan of Care (Signed)
  Problem: Education: Goal: Knowledge of General Education information will improve Description: Including pain rating scale, medication(s)/side effects and non-pharmacologic comfort measures Outcome: Progressing   Problem: Health Behavior/Discharge Planning: Goal: Ability to manage health-related needs will improve Outcome: Progressing   Problem: Activity: Goal: Risk for activity intolerance will decrease Outcome: Progressing   Problem: Nutrition: Goal: Adequate nutrition will be maintained Outcome: Progressing   Problem: Elimination: Goal: Will not experience complications related to bowel motility Outcome: Progressing Goal: Will not experience complications related to urinary retention Outcome: Progressing   Problem: Coping: Goal: Level of anxiety will decrease Outcome: Progressing   Problem: Pain Managment: Goal: General experience of comfort will improve and/or be controlled Outcome: Progressing   Problem: Safety: Goal: Ability to remain free from injury will improve Outcome: Progressing   Problem: Skin Integrity: Goal: Risk for impaired skin integrity will decrease Outcome: Progressing   Problem: Skin Integrity: Goal: Skin integrity will improve Outcome: Progressing

## 2023-12-21 NOTE — Plan of Care (Signed)
 Received report, sitting up in bed. Reports pain is tolerable. States very itch, scratches on back both arms. Discussed using a towel to scratch instead of his nails, verbalized understand. Bilateral feet dressings in place. Moves all extremities well, moves well in bed. Tolerating meal and fluids, denies nausea. Assessment completed.  2111 given narcotic med for L foot pain.  2200 reports pain has eased. 0012 asking for a meal and given with soda 0147 given narcotic for L foot pain. 0255 awake when check, reports pain is tolerable. Continue to monitor.  0315 awake when checked Slept on and off all night.

## 2023-12-21 NOTE — TOC Progression Note (Addendum)
 Transition of Care [facility]) - Progression Note    Patient Details  Name: Derrick Blanchard MRN: [id number] Date of Birth: [birth year]  [facility] CM/SW Contact  Odilia Bennett, LCSW Phone Number: 12/21/2023, 8:46 AM  Clinical Narrative:   [facility] admissions coordinator will review referral and determine decision.  9:33 am: [facility] is unable to offer a bed. CSW left voicemail for [facility] liaison to let her know that patient is accepting the bed offer and to start insurance authorization.  9:47 am: Received call back from [facility] They will start insurance authorization today.  10:41 am: CSW updated patient. He had a friend come by today who lives in [place]. She offered to have him come stay with her. Attending physician and podiatrist are on board with plan. Discussed home health vs outpatient therapy. Patient does not think friend would want anyone coming in and out of the home. He does not have transportation to get to outpatient therapy appointments. CSW asked therapy to talk to him about exercises he can do at home. Friend can pick him up in the morning. Notified pharmacy of need for meds to bed.  10:57 am: Ordered RW through [employer].  Expected Discharge Plan: Skilled Nursing Facility Barriers to Discharge: Continued Medical Work up  Expected Discharge Plan and Services     Post Acute Care Choice: Skilled Nursing Facility Living arrangements for the past 2 months: Single Family Home                                       Social Determinants of Health (SDOH) Interventions SDOH Screenings   Food Insecurity: Food Insecurity Present (12/13/2023)  Housing: High Risk (12/13/2023)  Transportation Needs: Unmet Transportation Needs (12/13/2023)  Utilities: Not At Risk (12/13/2023)  Financial Resource Strain: High Risk (03/22/2022)   Received from [facility], [facility]  Tobacco Use: High Risk (12/13/2023)     Readmission Risk Interventions     No data to display

## 2023-12-21 NOTE — Progress Notes (Signed)
 Physical Therapy Treatment Patient Details Name: Derrick Blanchard MRN: [id number] DOB: 01-25-1981 Today's Date: 12/21/2023   History of Present Illness 43 y.o. male with medical history significant of cirrhosis, depression, alcohol abuse, hypertension, substance abuse, TMA of the right foot. Admitted for infected left subfirst metatarsal ulceration - s/p left foot excision of ulceration with wound closure, and open biopsies of first proximal phalanx, first metatarsal head, fibular sesamoid 12/16/2023    PT Comments  Patient alert, agreeable to PT. Pt required CGA-minA for floor transfers and sit <> stand transfers with RW. Educated on technique for safe floor technique with fair carryover. Pt also navigated one step, twice, with minAx2 with RW, backwards. Pt unable to complete without assistance but verbalized he would have help at discharge. Returned to room and provided with HEP (written form). The patient would benefit from further skilled PT intervention to continue to progress towards goals.     If plan is discharge home, recommend the following: A lot of help with walking and/or transfers;A lot of help with bathing/dressing/bathroom;Assistance with cooking/housework;Assist for transportation;Help with stairs or ramp for entrance   Can travel by private vehicle     Yes  Equipment Recommendations  Other (comment) (TBD at next venue of care)    Recommendations for Other Services       Precautions / Restrictions Precautions Precautions: Fall Recall of Precautions/Restrictions: Intact Restrictions Weight Bearing Restrictions Per Provider Order: Yes LLE Weight Bearing Per Provider Order: Non weight bearing     Mobility  Bed Mobility Overal bed mobility: Modified Independent                  Transfers Overall transfer level: Needs assistance Equipment used: Rolling walker (2 wheels) Transfers: Sit to/from Stand Sit to Stand: Contact guard assist, Min assist            General transfer comment: usnteady, effortful especially when maintaining NWB    Ambulation/Gait Ambulation/Gait assistance: Supervision, Contact guard assist Gait Distance (Feet): 20 Feet Assistive device: Rolling walker (2 wheels)         General Gait Details: with fatigue pt places heel down/heel weight bears   Stairs Stairs: Yes Stairs assistance: Min assist, +2 physical assistance Stair Management: No rails, With walker Number of Stairs: 2 General stair comments: one step performed twice to practice safe technique, pt still required minAx2 for safety   Wheelchair Mobility     Tilt Bed    Modified Rankin (Stroke Patients Only)       Balance                                            Communication Communication Communication: No apparent difficulties  Cognition Arousal: Alert Behavior During Therapy: WFL for tasks assessed/performed                             Following commands: Intact      Cueing    Exercises Other Exercises Other Exercises: floor transfer with one assist, verbal cues. did put his foot down minimally to complete    General Comments        Pertinent Vitals/Pain Pain Assessment Pain Assessment: 0-10 Pain Score: 7  Pain Location: L foot Pain Descriptors / Indicators: Discomfort, Grimacing Pain Intervention(s): Limited activity within patient's tolerance, Monitored during session, Repositioned    Home  Living                          Prior Function            PT Goals (current goals can now be found in the care plan section) Progress towards PT goals: Progressing toward goals    Frequency    Min 2X/week      PT Plan      Co-evaluation              AM-PAC PT "6 Clicks" Mobility   Outcome Measure  Help needed turning from your back to your side while in a flat bed without using bedrails?: None Help needed moving from lying on your back to sitting on the side of a  flat bed without using bedrails?: None Help needed moving to and from a bed to a chair (including a wheelchair)?: None Help needed standing up from a chair using your arms (e.g., wheelchair or bedside chair)?: None Help needed to walk in hospital room?: A Little Help needed climbing 3-5 steps with a railing? : A Little 6 Click Score: 22    End of Session Equipment Utilized During Treatment: Gait belt Activity Tolerance: Patient tolerated treatment well Patient left: Other (comment) Nurse Communication: Mobility status PT Visit Diagnosis: Other abnormalities of gait and mobility (R26.89);Difficulty in walking, not elsewhere classified (R26.2);Muscle weakness (generalized) (M62.81);Pain Pain - Right/Left: Left Pain - part of body: Ankle and joints of foot     Time: 1051-1106 PT Time Calculation (min) (ACUTE ONLY): 15 min  Charges:    $Therapeutic Activity: 8-22 mins PT General Charges $$ ACUTE PT VISIT: 1 Visit                     Darien Eden PT, DPT 12:47 PM,12/21/23

## 2023-12-21 NOTE — Progress Notes (Signed)
 PROGRESS NOTE    DILLON LIVERMORE   IRW:[id number] DOB: Aug 18, 1981  DOA: 12/13/2023 Date of Service: 12/21/23 which is hospital day 7  PCP: Pcp, No    Hospital course / significant events:   HPI:  IVEN EARNHART is a 43 y.o. male with medical history significant of cirrhosis, depression, alcohol abuse, hypertension, remote hx substance abuse presenting with left lower extremity wound. Patient reports history of prior lower extremity wounds that have been managed as well as amputated in the [facility] system.   Has been followed by outpatient wound care for right great toe wound on the plantar aspect.  Has had multiple courses of antibiotics with minimal improvement in symptoms.  Noted worsening pain and intermittent purulent drainage. EtOH use, drinks roughly 15 pack every day versus multiple 40 ounces.  No reported illicit drug use.   04/09: to ED, admitted to hospitalist. MRI cannot exclude osteo 04/10: podiatry consult, plan surgery  04/11: Procedure was canceled due to persistent thrombocytopenia despite platelet transfusion 04/12: underwent I&D, bone biopsy.  Patient was transfused 1 bag of platelets prior to procedure. 04/13: awaiting results of bone pathology to determine patient's disposition.  Per podiatry he may need further surgical intervention if bone pathology is positive 04/14: Concerned about going home with his nonweightbearing status on his left lower extremity.  PT consult 04/15 : Surgical pathology is negative for osteomyelitis.  Podiatry rec strict NWB left foot. PT rec SNF on discharge.  TOC consult.  DC broad-spectrum antibiotics and start patient on Keflex 500 mg 4 times daily x 3 days. TOC presented bed offers to pt. 04/16-04/17: TOC working on placement, pending auth/admission. Pt states may stay w/ a friend rather than go to SNF will facilitate HH/DME as able, possible d/c 04/18     Consultants:  Podiatry  Procedures/Surgeries: 04/12: underwent I&D, bone  biopsy L lateral sesamoid, proximal phalanx first toe, head of first metatarsal        ASSESSMENT & PLAN:   Open wound of left lower extremity Rule out osteomyelitis 04/12 : Patient is status post left foot excision of ulceration with wound closure, and open biopsies of first proximal phalanx, first metatarsal head, fibular sesamoid.   Surgical pathology is negative for osteomyelitis --> discontinued IV antibiotic therapy and place patient on 3 days of oral Keflex 500 mg 4 times daily NWB, await placement for rehab vs home w/ HH if friend can facilitate this  Consider home health RN wound care and PT/OT if possible, pt states will check w/ his friend if this is ok, I strongly recommended at least RN wound care     Alcoholic cirrhosis of liver  Portal hypertension Pancytopenia Continued alcohol abuse Monitor closely for signs and symptoms of alcohol withdrawal Continue lorazepam and administer for CIWA score of 8 or greater Continue MVI and thiamine Pancytopenia persists and is secondary to alcohol abuse    GERD (gastroesophageal reflux disease) Continue PPI     Class 1 obesity based on BMI: Body mass index is 32.69 kg/m.  Underweight - under 18  overweight - 25 to 29 obese - 30 or more Class 1 obesity: BMI of 30.0 to 34 Class 2 obesity: BMI of 35.0 to 39 Class 3 obesity: BMI of 40.0 to 49 Super Morbid Obesity: BMI 50-59 Super-super Morbid Obesity: BMI 60+ Significantly low or high BMI is associated with higher medical risk.  Weight management advised as adjunct to other disease management and risk reduction treatments    DVT prophylaxis:  holding Rx d/t low Plt IV fluids: no continuous IV fluids  Nutrition: carb modified diet  Central lines / other devices: none  Code Status: FULL CODE ACP documentation reviewed: none on file in [facility]  TOC needs: placement for rehab vs HH/DME Medical barriers to dispo: none.             Subjective / Brief ROS:  Patient  reports no concerns today  Asks about getting some wound care supplies and meds before possibly going tomorrow  Denies CP/SOB.  Pain controlled.  Denies new weakness.  Tolerating diet.  Reports no concerns w/ urination/defecation.   Family Communication: none at this time     Objective Findings:  Vitals:   12/20/23 0841 12/20/23 2125 12/21/23 0314 12/21/23 0832  BP: 122/68 99/70 109/72 116/62  Pulse: 80 77 76 77  Resp: 16 18 18 16   Temp: 98.9 F (37.2 C) 98.6 F (37 C) 98.5 F (36.9 C) 98.6 F (37 C)  TempSrc:    Oral  SpO2: 100% 100% 100% 100%  Weight:      Height:        Intake/Output Summary (Last 24 hours) at 12/21/2023 1344 Last data filed at 12/21/2023 0900 Gross per 24 hour  Intake 1100 ml  Output 2600 ml  Net -1500 ml   Filed Weights   12/13/23 0851  Weight: 97.5 kg    Examination:  Physical Exam Constitutional:      General: He is not in acute distress.    Appearance: He is not ill-appearing.  Cardiovascular:     Rate and Rhythm: Normal rate and regular rhythm.  Pulmonary:     Effort: Pulmonary effort is normal.     Breath sounds: Normal breath sounds.  Abdominal:     General: Abdomen is flat.     Palpations: Abdomen is soft.  Musculoskeletal:     Right lower leg: No edema.     Left lower leg: No edema.     Comments: LLE wrap was not removed, no proximal erythema/edema/tenderness  Skin:    General: Skin is warm and dry.  Neurological:     Mental Status: He is alert and oriented to person, place, and time. Mental status is at baseline.  Psychiatric:        Mood and Affect: Mood normal.        Behavior: Behavior normal.          Scheduled Medications:   cephALEXin  500 mg Oral Q6H   folic acid  1 mg Oral Daily   multivitamin with minerals  1 tablet Oral Daily   thiamine  100 mg Oral Daily   Or   thiamine  100 mg Intravenous Daily    Continuous Infusions:   PRN Medications:  ondansetron **OR** ondansetron (ZOFRAN) IV,  oxyCODONE  Antimicrobials from admission:  Anti-infectives (From admission, onward)    Start     Dose/Rate Route Frequency Ordered Stop   12/19/23 1415  cephALEXin (KEFLEX) capsule 500 mg        500 mg Oral Every 6 hours 12/19/23 1321 12/22/23 1159   12/19/23 1100  vancomycin (VANCOCIN) 1,250 mg in sodium chloride 0.9 % 250 mL IVPB  Status:  Discontinued        1,250 mg 175 mL/hr over 90 Minutes Intravenous Every 12 hours 12/19/23 0621 12/19/23 0836   12/19/23 1100  vancomycin (VANCOREADY) IVPB 1500 mg/300 mL  Status:  Discontinued        1,500 mg 150 mL/hr over  120 Minutes Intravenous Every 12 hours 12/19/23 0836 12/19/23 1321   12/16/23 0700  vancomycin (VANCOREADY) IVPB 1250 mg/250 mL  Status:  Discontinued        1,250 mg 166.7 mL/hr over 90 Minutes Intravenous Every 12 hours 12/16/23 0610 12/19/23 0620   12/15/23 1800  vancomycin (VANCOREADY) IVPB 1750 mg/350 mL  Status:  Discontinued        1,750 mg 175 mL/hr over 120 Minutes Intravenous Every 12 hours 12/15/23 0728 12/16/23 0610   12/14/23 0300  vancomycin (VANCOREADY) IVPB 1750 mg/350 mL  Status:  Discontinued        1,750 mg 175 mL/hr over 120 Minutes Intravenous Every 12 hours 12/13/23 1320 12/15/23 0728   12/13/23 2130  ceFEPIme (MAXIPIME) 2 g in sodium chloride 0.9 % 100 mL IVPB  Status:  Discontinued        2 g 200 mL/hr over 30 Minutes Intravenous Every 8 hours 12/13/23 1318 12/19/23 1321   12/13/23 1400  metroNIDAZOLE (FLAGYL) IVPB 500 mg  Status:  Discontinued        500 mg 100 mL/hr over 60 Minutes Intravenous Every 12 hours 12/13/23 1302 12/19/23 1321   12/13/23 1130  ceFEPIme (MAXIPIME) 2 g in sodium chloride 0.9 % 100 mL IVPB        2 g 200 mL/hr over 30 Minutes Intravenous  Once 12/13/23 1119 12/13/23 1346   12/13/23 1130  vancomycin (VANCOREADY) IVPB 2000 mg/400 mL        2,000 mg 200 mL/hr over 120 Minutes Intravenous  Once 12/13/23 1119 12/13/23 1712           Data Reviewed:  I have personally  reviewed the following...  CBC: Recent Labs  Lab 12/16/23 0435 12/16/23 0816 12/17/23 0425 12/18/23 0857 12/21/23 0335  WBC 2.6* 2.7* 2.9* 2.8* 2.2*  HGB 10.1* 10.2* 9.4* 9.6* 9.4*  HCT 32.1* 32.3* 30.3* 30.6* 30.3*  MCV 84.5 82.6 84.9 82.3 83.2  PLT 52* 63* 51* 45* 51*   Basic Metabolic Panel: Recent Labs  Lab 12/16/23 0435 12/17/23 0425 12/18/23 0521 12/19/23 0544 12/20/23 0510 12/21/23 0335  NA 137 134* 134* 134* 136 138  K 3.7 3.7 3.6 3.7 4.0 4.2  CL 110 107 106 109 108 107  CO2 23 20* 22 22 24 25   GLUCOSE 74 107* 116* 122* 129* 109*  BUN 14 11 10 10 10 9   CREATININE 0.62 0.51* 0.64 0.58* 0.58* 0.51*  CALCIUM 8.4* 8.3* 8.4* 8.2* 8.2* 8.7*  MG 1.8 1.9 1.8 1.9 1.7 1.9  PHOS 2.8 2.4* 2.4* 2.2* 2.5  --    GFR: Estimated Creatinine Clearance: 138.9 mL/min (A) (by C-G formula based on SCr of 0.51 mg/dL (L)). Liver Function Tests: Recent Labs  Lab 12/17/23 0425 12/18/23 0521 12/19/23 0544 12/20/23 0510  ALBUMIN 3.0* 3.1* 3.1* 2.8*   No results for input(s): "LIPASE", "AMYLASE" in the last 168 hours. No results for input(s): "AMMONIA" in the last 168 hours. Coagulation Profile: No results for input(s): "INR", "PROTIME" in the last 168 hours.  Cardiac Enzymes: No results for input(s): "CKTOTAL", "CKMB", "CKMBINDEX", "TROPONINI" in the last 168 hours. BNP (last 3 results) No results for input(s): "PROBNP" in the last 8760 hours. HbA1C: No results for input(s): "HGBA1C" in the last 72 hours. CBG: No results for input(s): "GLUCAP" in the last 168 hours. Lipid Profile: No results for input(s): "CHOL", "HDL", "LDLCALC", "TRIG", "CHOLHDL", "LDLDIRECT" in the last 72 hours. Thyroid Function Tests: No results for input(s): "TSH", "T4TOTAL", "FREET4", "T3FREE", "  THYROIDAB" in the last 72 hours. Anemia Panel: No results for input(s): "VITAMINB12", "FOLATE", "FERRITIN", "TIBC", "IRON", "RETICCTPCT" in the last 72 hours. Most Recent Urinalysis On File:     Component  Value Date/Time   COLORURINE YELLOW (A) 03/11/2023 0003   APPEARANCEUR CLEAR (A) 03/11/2023 0003   APPEARANCEUR Clear 02/01/2013 1851   LABSPEC 1.006 03/11/2023 0003   LABSPEC 1.015 02/01/2013 1851   PHURINE 6.0 03/11/2023 0003   GLUCOSEU NEGATIVE 03/11/2023 0003   GLUCOSEU 50 mg/dL 40/98/1191 4782   HGBUR NEGATIVE 03/11/2023 0003   BILIRUBINUR NEGATIVE 03/11/2023 0003   BILIRUBINUR 2+ 02/01/2013 1851   KETONESUR NEGATIVE 03/11/2023 0003   PROTEINUR NEGATIVE 03/11/2023 0003   NITRITE NEGATIVE 03/11/2023 0003   LEUKOCYTESUR NEGATIVE 03/11/2023 0003   LEUKOCYTESUR Negative 02/01/2013 1851   Sepsis Labs: @LABRCNTIP (procalcitonin:4,lacticidven:4) Microbiology: Recent Results (from the past 240 hours)  Aerobic/Anaerobic Culture w Gram Stain (surgical/deep wound)     Status: None   Collection Time: 12/16/23 11:17 AM   Specimen: Wound; Tissue  Result Value Ref Range Status   Specimen Description   Final    WOUND Performed at [facility], [address] [place], [place] [address]    Special Requests   Final    NONE Performed at [facility], [address] [place], [place] [address]    Gram Stain RARE WBC SEEN NO ORGANISMS SEEN   Final   Culture   Final    RARE CORYNEBACTERIUM STRIATUM Standardized susceptibility testing for this organism is not available. NO ANAEROBES ISOLATED Performed at [facility], [address] [place], [place] [address]    Report Status 12/21/2023 FINAL  Final      Radiology Studies last 3 days: No results found.        Keiton Cosma, DO [facility] 12/21/2023, 1:44 PM    Dictation software may have been used to generate the above note. Typos may occur and escape review in typed/dictated notes. Please contact Dr Authur Leghorn directly for clarity if needed.  Staff may message me via secure chat in Epic  but this may not receive an immediate response,  please page me for urgent matters!  If  7PM-7AM, please contact night coverage [email or web address]

## 2023-12-21 NOTE — Consult Note (Signed)
 PHARMACY CONSULT NOTE - ELECTROLYTES  Pharmacy Consult for Electrolyte Monitoring and Replacement   Recent Labs: Height: 5\' 8"  (172.7 cm) Weight: 97.5 kg (215 lb) IBW/kg (Calculated) : 68.4 Estimated Creatinine Clearance: 138.9 mL/min (A) (by C-G formula based on SCr of 0.51 mg/dL (L)). Potassium (mmol/L)  Date Value  12/21/2023 4.2  02/12/2013 4.7   Magnesium (mg/dL)  Date Value  16/06/9603 1.9  02/11/2013 2.0   Calcium (mg/dL)  Date Value  54/05/8118 8.7 (L)   Calcium, Total (mg/dL)  Date Value  14/78/2956 8.3 (L)   Albumin (g/dL)  Date Value  21/30/8657 2.8 (L)  11/24/2017 2.9 (L)  02/11/2013 2.3 (L)   Phosphorus (mg/dL)  Date Value  84/69/6295 2.5   Sodium (mmol/L)  Date Value  12/21/2023 138  11/24/2017 144  02/12/2013 138    Assessment  Derrick Blanchard is a 43 y.o. male presenting with infected left foot wound. PMH significant for HCV, neuropathy, chronic left foot wound, and cirrhosis. Pharmacy has been consulted to monitor and replace electrolytes.  Goal of Therapy: Electrolytes WNL  Plan: No electrolyte replacement warranted for today Given stability of electrolytes will defer AM labs to MD  Thank you for allowing pharmacy to be a part of this patient's care.  Adalberto Acton, PharmD Clinical Pharmacist 12/21/2023 7:05 AM

## 2023-12-22 ENCOUNTER — Other Ambulatory Visit: Payer: Self-pay

## 2023-12-22 DIAGNOSIS — S91302A Unspecified open wound, left foot, initial encounter: Secondary | ICD-10-CM | POA: Diagnosis not present

## 2023-12-22 NOTE — Discharge Summary (Signed)
 Physician Discharge Summary   Patient: Derrick Blanchard MRN: [id number]  DOB: 03/07/1981   Admit:     Date of Admission: 12/13/2023 Admitted from: home   Discharge: Date of discharge: 12/22/23 Disposition: Home Condition at discharge: good  CODE STATUS: FULL CODE     Discharge Physician: Laneta Blunt, DO [facility]     PCP: Pcp, No  Recommendations for Outpatient Follow-up:  Follow up with PCP in 1-2 weeks Follow up with podiatry in 1-2 weeks  Of note, declined SNF placement and home health services        Discharge Diagnoses: Principal Problem:   Wound of left foot Active Problems:   Coagulopathy (HCC)   Open wound of left lower extremity   Alcoholic cirrhosis of liver (HCC)   Pancytopenia (HCC)   Thrombocytopenia (HCC)   GERD (gastroesophageal reflux disease)   ETOH abuse   Pyogenic inflammation of bone [facility])       Hospital course / significant events:   HPI:  Derrick Blanchard is a 43 y.o. male with medical history significant of cirrhosis, depression, alcohol abuse, hypertension, remote hx substance abuse presenting with left lower extremity wound. Patient reports history of prior lower extremity wounds that have been managed as well as amputated in the [facility] system.   Has been followed by outpatient wound care for right great toe wound on the plantar aspect.  Has had multiple courses of antibiotics with minimal improvement in symptoms.  Noted worsening pain and intermittent purulent drainage. EtOH use, drinks roughly 15 pack every day versus multiple 40 ounces.  No reported illicit drug use.   04/09: to ED, admitted to hospitalist. MRI cannot exclude osteo 04/10: podiatry consult, plan surgery  04/11: Procedure was canceled due to persistent thrombocytopenia despite platelet transfusion 04/12: underwent I&D, bone biopsy.  Patient was transfused 1 bag of platelets prior to procedure. 04/13: awaiting results of bone pathology to  determine patient's disposition.  Per podiatry he may need further surgical intervention if bone pathology is positive 04/14: Concerned about going home with his nonweightbearing status on his left lower extremity.  PT consult 04/15 : Surgical pathology is negative for osteomyelitis.  Podiatry rec strict NWB left foot. PT rec SNF on discharge.  TOC consult.  DC broad-spectrum antibiotics and start patient on Keflex  500 mg 4 times daily x 3 days. TOC presented bed offers to pt. 04/16-04/17: TOC working on placement, pending auth/admission. Pt states may stay w/ a friend rather than go to SNF will facilitate HH/DME as able, possible d/c 04/18 04/18: stable for discharge      Consultants:  Podiatry  Procedures/Surgeries: 04/12: underwent I&D, bone biopsy L lateral sesamoid, proximal phalanx first toe, head of first metatarsal        ASSESSMENT & PLAN:   Open wound of left lower extremity Rule out osteomyelitis 04/12 : Patient is status post left foot excision of ulceration with wound closure, and open biopsies of first proximal phalanx, first metatarsal head, fibular sesamoid.   Surgical pathology is negative for osteomyelitis --> discontinued IV antibiotic therapy and place patient on 3 days of oral Keflex  500 mg 4 times daily NWB, await placement for rehab vs home w/ HH if friend can facilitate this  Podiatry follow up     Alcoholic cirrhosis of liver  Portal hypertension Pancytopenia Continued alcohol abuse Stressed importance of EtOH abstinence Continue MVI and thiamine  Pancytopenia persists and is secondary to alcohol abuse    GERD (gastroesophageal reflux disease)  Continue PPI     Class 1 obesity based on BMI: Body mass index is 32.69 kg/m.  Underweight - under 18  overweight - 25 to 29 obese - 30 or more Class 1 obesity: BMI of 30.0 to 34 Class 2 obesity: BMI of 35.0 to 39 Class 3 obesity: BMI of 40.0 to 49 Super Morbid Obesity: BMI 50-59 Super-super Morbid  Obesity: BMI 60+ Significantly low or high BMI is associated with higher medical risk.  Weight management advised as adjunct to other disease management and risk reduction treatments            Discharge Instructions  Allergies as of 12/22/2023   No Known Allergies      Medication List     STOP taking these medications    doxycycline  100 MG capsule Commonly known as: VIBRAMYCIN        TAKE these medications    folic acid  1 MG tablet Commonly known as: FOLVITE  Take 1 tablet (1 mg total) by mouth daily.   multivitamin with minerals Tabs tablet Take 1 tablet by mouth daily.   oxyCODONE  5 MG immediate release tablet Commonly known as: Oxy IR/ROXICODONE  Take 1-2 tablets (5-10 mg total) by mouth every 6 (six) hours as needed for severe pain (pain score 7-10) or moderate pain (pain score 4-6).   pantoprazole  40 MG tablet Commonly known as: PROTONIX  Take 1 tablet (40 mg total) by mouth daily.   thiamine  100 MG tablet Commonly known as: VITAMIN B1 Take 1 tablet (100 mg total) by mouth daily.               Durable Medical Equipment  (From admission, onward)           Start     Ordered   12/21/23 1025  For home use only DME Walker rolling  Once       Question Answer Comment  Walker: With 5 Inch Wheels   Patient needs a walker to treat with the following condition Foot ulcer (HCC)      12/21/23 1025             Contact information for after-discharge care     Destination     [facility] .   Service: Skilled Nursing Contact information: [address] [place]  [phone]                     No Known Allergies   Subjective: pt reports feeling well this morning, no CP/SOB, no HA/VC, no fever/shills, pain controlled    Discharge Exam: BP 111/72 (BP Location: Right Arm)   Pulse 75   Temp 98.2 F (36.8 C) (Oral)   Resp 16   Ht 5' 8 (1.727 m)   Wt 97.5 kg   SpO2  100%   BMI 32.69 kg/m  General: Pt is alert, awake, not in acute distress Cardiovascular: RRR, S1/S2 +, no rubs, no gallops Respiratory: CTA bilaterally, no wheezing, no rhonchi Abdominal: Soft, NT, ND, bowel sounds + Extremities: no edema, bandage intact     The results of significant diagnostics from this hospitalization (including imaging, microbiology, ancillary and laboratory) are listed below for reference.     Microbiology: Recent Results (from the past 240 hours)  Aerobic/Anaerobic Culture w Gram Stain (surgical/deep wound)     Status: None   Collection Time: 12/16/23 11:17 AM   Specimen: Wound; Tissue  Result Value Ref Range Status   Specimen Description   Final  WOUND Performed at [facility], [address] [place], [place] [address]    Special Requests   Final    NONE Performed at [facility], [address] [place], [place] [address]    Gram Stain RARE WBC SEEN NO ORGANISMS SEEN   Final   Culture   Final    RARE CORYNEBACTERIUM STRIATUM Standardized susceptibility testing for this organism is not available. NO ANAEROBES ISOLATED Performed at [facility], [address] [place], [place] [address]    Report Status 12/21/2023 FINAL  Final     Labs: BNP (last 3 results) No results for input(s): BNP in the last 8760 hours. Basic Metabolic Panel: Recent Labs  Lab 12/16/23 0435 12/17/23 0425 12/18/23 0521 12/19/23 0544 12/20/23 0510 12/21/23 0335  NA 137 134* 134* 134* 136 138  K 3.7 3.7 3.6 3.7 4.0 4.2  CL 110 107 106 109 108 107  CO2 23 20* 22 22 24 25   GLUCOSE 74 107* 116* 122* 129* 109*  BUN 14 11 10 10 10 9   CREATININE 0.62 0.51* 0.64 0.58* 0.58* 0.51*  CALCIUM  8.4* 8.3* 8.4* 8.2* 8.2* 8.7*  MG 1.8 1.9 1.8 1.9 1.7 1.9  PHOS 2.8 2.4* 2.4* 2.2* 2.5  --    Liver Function Tests: Recent Labs  Lab 12/17/23 0425 12/18/23 0521 12/19/23 0544 12/20/23 0510  ALBUMIN 3.0* 3.1* 3.1* 2.8*   No results for  input(s): LIPASE, AMYLASE in the last 168 hours. No results for input(s): AMMONIA in the last 168 hours. CBC: Recent Labs  Lab 12/16/23 0435 12/16/23 0816 12/17/23 0425 12/18/23 0857 12/21/23 0335  WBC 2.6* 2.7* 2.9* 2.8* 2.2*  HGB 10.1* 10.2* 9.4* 9.6* 9.4*  HCT 32.1* 32.3* 30.3* 30.6* 30.3*  MCV 84.5 82.6 84.9 82.3 83.2  PLT 52* 63* 51* 45* 51*   Cardiac Enzymes: No results for input(s): CKTOTAL, CKMB, CKMBINDEX, TROPONINI in the last 168 hours. BNP: Invalid input(s): POCBNP CBG: No results for input(s): GLUCAP in the last 168 hours. D-Dimer No results for input(s): DDIMER in the last 72 hours. Hgb A1c No results for input(s): HGBA1C in the last 72 hours. Lipid Profile No results for input(s): CHOL, HDL, LDLCALC, TRIG, CHOLHDL, LDLDIRECT in the last 72 hours. Thyroid function studies No results for input(s): TSH, T4TOTAL, T3FREE, THYROIDAB in the last 72 hours.  Invalid input(s): FREET3 Anemia work up No results for input(s): VITAMINB12, FOLATE, FERRITIN, TIBC, IRON , RETICCTPCT in the last 72 hours. Urinalysis    Component Value Date/Time   COLORURINE YELLOW (A) 03/11/2023 0003   APPEARANCEUR CLEAR (A) 03/11/2023 0003   APPEARANCEUR Clear 02/01/2013 1851   LABSPEC 1.006 03/11/2023 0003   LABSPEC 1.015 02/01/2013 1851   PHURINE 6.0 03/11/2023 0003   GLUCOSEU NEGATIVE 03/11/2023 0003   GLUCOSEU 50 mg/dL 94/69/7985 8148   HGBUR NEGATIVE 03/11/2023 0003   BILIRUBINUR NEGATIVE 03/11/2023 0003   BILIRUBINUR 2+ 02/01/2013 1851   KETONESUR NEGATIVE 03/11/2023 0003   PROTEINUR NEGATIVE 03/11/2023 0003   NITRITE NEGATIVE 03/11/2023 0003   LEUKOCYTESUR NEGATIVE 03/11/2023 0003   LEUKOCYTESUR Negative 02/01/2013 1851   Sepsis Labs Recent Labs  Lab 12/16/23 0816 12/17/23 0425 12/18/23 0857 12/21/23 0335  WBC 2.7* 2.9* 2.8* 2.2*   Microbiology Recent Results (from the past 240 hours)  Aerobic/Anaerobic  Culture w Gram Stain (surgical/deep wound)     Status: None   Collection Time: 12/16/23 11:17 AM   Specimen: Wound; Tissue  Result Value Ref Range Status   Specimen Description  Final    WOUND Performed at [facility], [address] [place], [place] [address]    Special Requests   Final    NONE Performed at [facility], [address] [place], [place] [address]    Gram Stain RARE WBC SEEN NO ORGANISMS SEEN   Final   Culture   Final    RARE CORYNEBACTERIUM STRIATUM Standardized susceptibility testing for this organism is not available. NO ANAEROBES ISOLATED Performed at [facility], [address] [place], [place] [address]    Report Status 12/21/2023 FINAL  Final   Imaging MR FOOT LEFT WO CONTRAST Result Date: 12/13/2023 CLINICAL DATA:  Left plantar foot wound.  Concern for osteomyelitis. EXAM: MRI OF THE LEFT FOOT WITHOUT CONTRAST TECHNIQUE: Multiplanar, multisequence MR imaging of the left foot was performed. No intravenous contrast was administered. COMPARISON:  left foot radiographs dated 12/13/2023. MR left foot dated 03/11/2023. FINDINGS: Bones/Joint/Cartilage No marrow signal abnormality identified to suggest osteomyelitis. 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 No acute fracture or dislocation. Mild edema of the lateral hallux sesamoid without associated T1 marrow signal abnormality. Small first MTP joint effusion. Mild degenerative changes of the first MTP joint. Ligaments Collateral ligaments are intact.  Lisfranc ligament is intact. Muscles and Tendons Flexor and extensor compartments are intact. No significant tenosynovitis. Diffuse atrophy of the intrinsic musculature of the foot, likely reflecting chronic denervation changes. Soft tissue Plantar soft tissue ulceration underlying the first MTP joint. There is surrounding soft tissue edema and ill-defined fluid. No discrete loculated collection. IMPRESSION: 1. Plantar soft tissue  ulceration underlying the first MTP joint. Surrounding soft tissue swelling with ill-defined fluid is concerning for cellulitis with phlegmonous changes. No loculated collection identified. 2. Mild marrow edema of the lateral hallux sesamoid, similar to the prior exam. This could reflect sesamoiditis or reactive marrow change. Early osteomyelitis can not be entirely excluded. 3. Small first MTP joint effusion may be reactive, however, septic arthritis can not be excluded. 4. 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 5. Mild degenerative changes of the midfoot and first MTP joint. Electronically Signed   By: Harrietta Sherry M.D.   On: 12/13/2023 12:27   DG Foot Complete Left Result Date: 12/13/2023 CLINICAL DATA:  Left foot ulcer to the plantar distal first metatarsal, evaluating for possible bone involvement EXAM: LEFT FOOT - COMPLETE 3+ VIEW COMPARISON:  10/08/2023. FINDINGS: There is diffuse osteopenia of the visualized osseous structures. No acute fracture or dislocation. No aggressive osseous lesion. Note is again made of amputation of second toe at the level of metatarsophalangeal joint. Mild diffuse degenerative changes of imaged bones. There is an approximately 1.0 x 1.9 cm lucency overlying the plantar aspect of the head of second metatarsal region with associated surrounding soft tissue swelling. However, ulcer does not reach up to the bone surface. Underlying bone is intact. No focal bone erosions. No radiopaque foreign bodies. IMPRESSION: *There is an approximately 1.0 x 1.9 cm lucency overlying the plantar aspect of the head of second metatarsal region with associated surrounding soft tissue swelling. However, ulcer does not reach up to the bone surface. Underlying bone is intact. No focal bone erosions. No radiographic evidence of osteomyelitis. Electronically Signed   By: Ree Molt M.D.   On: 12/13/2023 09:53      Time coordinating discharge: over 30  minutes  SIGNED:  Malayshia All DO [facility]

## 2023-12-22 NOTE — Progress Notes (Signed)
 Pt asked for donation clothing. Pt given 1 set blue paper scrubs for discharge. Pt walking in room bearing weight on LLE. Pt reminded to not put any weight on L foot. Pt is wearing post op shoe.

## 2023-12-27 ENCOUNTER — Emergency Department

## 2023-12-27 ENCOUNTER — Inpatient Hospital Stay
Admission: EM | Admit: 2023-12-27 | Discharge: 2024-01-04 | DRG: 464 | Disposition: A | Discharge status: Home / Self Care | Attending: Family Medicine | Admitting: Family Medicine

## 2023-12-27 ENCOUNTER — Inpatient Hospital Stay

## 2023-12-27 ENCOUNTER — Other Ambulatory Visit: Payer: Self-pay

## 2023-12-27 DIAGNOSIS — Z801 Family history of malignant neoplasm of trachea, bronchus and lung: Secondary | ICD-10-CM | POA: Diagnosis not present

## 2023-12-27 DIAGNOSIS — Z91199 Patient's noncompliance with other medical treatment and regimen due to unspecified reason: Secondary | ICD-10-CM

## 2023-12-27 DIAGNOSIS — L97525 Non-pressure chronic ulcer of other part of left foot with muscle involvement without evidence of necrosis: Secondary | ICD-10-CM

## 2023-12-27 DIAGNOSIS — D61818 Other pancytopenia: Secondary | ICD-10-CM | POA: Diagnosis present

## 2023-12-27 DIAGNOSIS — F32A Depression, unspecified: Secondary | ICD-10-CM | POA: Diagnosis present

## 2023-12-27 DIAGNOSIS — B952 Enterococcus as the cause of diseases classified elsewhere: Secondary | ICD-10-CM | POA: Diagnosis present

## 2023-12-27 DIAGNOSIS — M869 Osteomyelitis, unspecified: Secondary | ICD-10-CM | POA: Diagnosis present

## 2023-12-27 DIAGNOSIS — T8149XA Infection following a procedure, other surgical site, initial encounter: Principal | ICD-10-CM

## 2023-12-27 DIAGNOSIS — Y835 Amputation of limb(s) as the cause of abnormal reaction of the patient, or of later complication, without mention of misadventure at the time of the procedure: Secondary | ICD-10-CM | POA: Diagnosis present

## 2023-12-27 DIAGNOSIS — I96 Gangrene, not elsewhere classified: Secondary | ICD-10-CM | POA: Diagnosis present

## 2023-12-27 DIAGNOSIS — S91302D Unspecified open wound, left foot, subsequent encounter: Secondary | ICD-10-CM | POA: Diagnosis not present

## 2023-12-27 DIAGNOSIS — Z1621 Resistance to vancomycin: Secondary | ICD-10-CM | POA: Diagnosis present

## 2023-12-27 DIAGNOSIS — S20213A Contusion of bilateral front wall of thorax, initial encounter: Secondary | ICD-10-CM | POA: Diagnosis present

## 2023-12-27 DIAGNOSIS — T8744 Infection of amputation stump, left lower extremity: Principal | ICD-10-CM | POA: Diagnosis present

## 2023-12-27 DIAGNOSIS — Z1611 Resistance to penicillins: Secondary | ICD-10-CM | POA: Diagnosis present

## 2023-12-27 DIAGNOSIS — Y9383 Activity, rough housing and horseplay: Secondary | ICD-10-CM

## 2023-12-27 DIAGNOSIS — L97529 Non-pressure chronic ulcer of other part of left foot with unspecified severity: Secondary | ICD-10-CM | POA: Diagnosis present

## 2023-12-27 DIAGNOSIS — K7030 Alcoholic cirrhosis of liver without ascites: Secondary | ICD-10-CM | POA: Diagnosis present

## 2023-12-27 DIAGNOSIS — D649 Anemia, unspecified: Secondary | ICD-10-CM | POA: Diagnosis present

## 2023-12-27 DIAGNOSIS — Z6833 Body mass index (BMI) 33.0-33.9, adult: Secondary | ICD-10-CM

## 2023-12-27 DIAGNOSIS — F1020 Alcohol dependence, uncomplicated: Secondary | ICD-10-CM | POA: Diagnosis present

## 2023-12-27 DIAGNOSIS — E876 Hypokalemia: Secondary | ICD-10-CM | POA: Diagnosis present

## 2023-12-27 DIAGNOSIS — F17210 Nicotine dependence, cigarettes, uncomplicated: Secondary | ICD-10-CM | POA: Diagnosis present

## 2023-12-27 DIAGNOSIS — T8740 Infection of amputation stump, unspecified extremity: Secondary | ICD-10-CM | POA: Diagnosis present

## 2023-12-27 DIAGNOSIS — K921 Melena: Secondary | ICD-10-CM | POA: Diagnosis present

## 2023-12-27 DIAGNOSIS — M86172 Other acute osteomyelitis, left ankle and foot: Secondary | ICD-10-CM | POA: Diagnosis not present

## 2023-12-27 DIAGNOSIS — I10 Essential (primary) hypertension: Secondary | ICD-10-CM | POA: Diagnosis present

## 2023-12-27 DIAGNOSIS — T8781 Dehiscence of amputation stump: Secondary | ICD-10-CM | POA: Diagnosis present

## 2023-12-27 DIAGNOSIS — E669 Obesity, unspecified: Secondary | ICD-10-CM | POA: Diagnosis present

## 2023-12-27 LAB — COMPREHENSIVE METABOLIC PANEL WITH GFR
ALT: 26 U/L (ref 0–44)
AST: 60 U/L — ABNORMAL HIGH (ref 15–41)
Albumin: 3.1 g/dL — ABNORMAL LOW (ref 3.5–5.0)
Alkaline Phosphatase: 98 U/L (ref 38–126)
Anion gap: 5 (ref 5–15)
BUN: 8 mg/dL (ref 6–20)
CO2: 25 mmol/L (ref 22–32)
Calcium: 8.8 mg/dL — ABNORMAL LOW (ref 8.9–10.3)
Chloride: 111 mmol/L (ref 98–111)
Creatinine, Ser: 0.65 mg/dL (ref 0.61–1.24)
GFR, Estimated: >60 mL/min (ref >60)
Glucose, Bld: 118 mg/dL — ABNORMAL HIGH (ref 70–99)
Potassium: 3.8 mmol/L (ref 3.5–5.1)
Sodium: 141 mmol/L (ref 135–145)
Total Bilirubin: 2 mg/dL — ABNORMAL HIGH (ref 0.0–1.2)
Total Protein: 6.4 g/dL — ABNORMAL LOW (ref 6.5–8.1)

## 2023-12-27 LAB — LACTIC ACID, PLASMA
Lactic Acid, Venous: 1.4 mmol/L (ref 0.5–1.9)
Lactic Acid, Venous: 1.9 mmol/L (ref 0.5–1.9)

## 2023-12-27 LAB — CBC WITH DIFFERENTIAL/PLATELET
Abs Immature Granulocytes: 0.01 10*3/uL (ref 0.00–0.07)
Basophils Absolute: 0 10*3/uL (ref 0.0–0.1)
Basophils Relative: 0 %
Eosinophils Absolute: 0 10*3/uL (ref 0.0–0.5)
Eosinophils Relative: 0 %
HCT: 29.4 % — ABNORMAL LOW (ref 39.0–52.0)
Hemoglobin: 9.3 g/dL — ABNORMAL LOW (ref 13.0–17.0)
Immature Granulocytes: 0 %
Lymphocytes Relative: 13 %
Lymphs Abs: 0.4 10*3/uL — ABNORMAL LOW (ref 0.7–4.0)
MCH: 25.9 pg — ABNORMAL LOW (ref 26.0–34.0)
MCHC: 31.6 g/dL (ref 30.0–36.0)
MCV: 81.9 fL (ref 80.0–100.0)
Monocytes Absolute: 0.4 10*3/uL (ref 0.1–1.0)
Monocytes Relative: 14 %
Neutro Abs: 1.9 10*3/uL (ref 1.7–7.7)
Neutrophils Relative %: 73 %
Platelets: 46 10*3/uL — ABNORMAL LOW (ref 150–400)
RBC: 3.59 MIL/uL — ABNORMAL LOW (ref 4.22–5.81)
RDW: 18.2 % — ABNORMAL HIGH (ref 11.5–15.5)
WBC: 2.7 10*3/uL — ABNORMAL LOW (ref 4.0–10.5)
nRBC: 0 % (ref 0.0–0.2)

## 2023-12-27 LAB — MAGNESIUM: Magnesium: 1.7 mg/dL (ref 1.7–2.4)

## 2023-12-27 MED ORDER — ADULT MULTIVITAMIN W/MINERALS CH
1.0000 | ORAL_TABLET | Freq: Every day | ORAL | Status: DC
  Administered 2023-12-27 – 2024-01-04 (×9): 1 via ORAL
  Filled 2023-12-27 (×9): qty 1

## 2023-12-27 MED ORDER — OXYCODONE HCL 5 MG PO TABS
10.0000 mg | ORAL_TABLET | Freq: Four times a day (QID) | ORAL | Status: DC | PRN
  Administered 2023-12-28 – 2024-01-01 (×13): 10 mg via ORAL
  Filled 2023-12-27 (×13): qty 2

## 2023-12-27 MED ORDER — HYDROMORPHONE HCL 1 MG/ML IJ SOLN
0.5000 mg | INTRAMUSCULAR | Status: DC | PRN
  Administered 2023-12-27 – 2023-12-31 (×10): 1 mg via INTRAVENOUS
  Filled 2023-12-27 (×13): qty 1

## 2023-12-27 MED ORDER — THIAMINE HCL 100 MG/ML IJ SOLN: 100.0000 mg | Freq: Every day | INTRAMUSCULAR | Status: DC

## 2023-12-27 MED ORDER — THIAMINE MONONITRATE 100 MG PO TABS
100.0000 mg | ORAL_TABLET | Freq: Every day | ORAL | Status: DC
  Administered 2023-12-27 – 2024-01-04 (×9): 100 mg via ORAL
  Filled 2023-12-27 (×9): qty 1

## 2023-12-27 MED ORDER — SODIUM CHLORIDE 0.9 % IV SOLN
2.0000 g | INTRAVENOUS | Status: DC
  Administered 2023-12-27 – 2024-01-02 (×7): 2 g via INTRAVENOUS
  Filled 2023-12-27 (×9): qty 20

## 2023-12-27 MED ORDER — ACETAMINOPHEN 325 MG PO TABS
650.0000 mg | ORAL_TABLET | Freq: Four times a day (QID) | ORAL | Status: DC | PRN
  Administered 2024-01-01 – 2024-01-03 (×2): 650 mg via ORAL
  Filled 2023-12-27 (×2): qty 2

## 2023-12-27 MED ORDER — ONDANSETRON HCL 4 MG PO TABS: 4.0000 mg | ORAL_TABLET | Freq: Four times a day (QID) | ORAL | Status: DC | PRN

## 2023-12-27 MED ORDER — CHLORDIAZEPOXIDE HCL 25 MG PO CAPS
25.0000 mg | ORAL_CAPSULE | Freq: Every day | ORAL | Status: AC
  Administered 2023-12-31 – 2024-01-01 (×2): 25 mg via ORAL
  Filled 2023-12-27 (×2): qty 1

## 2023-12-27 MED ORDER — LORAZEPAM 1 MG PO TABS
1.0000 mg | ORAL_TABLET | ORAL | Status: AC | PRN
  Administered 2023-12-28: 1 mg via ORAL
  Filled 2023-12-27: qty 1

## 2023-12-27 MED ORDER — MAGNESIUM SULFATE 2 GM/50ML IV SOLN
2.0000 g | Freq: Once | INTRAVENOUS | Status: AC
  Administered 2023-12-27: 2 g via INTRAVENOUS
  Filled 2023-12-27: qty 50

## 2023-12-27 MED ORDER — FOLIC ACID 1 MG PO TABS
1.0000 mg | ORAL_TABLET | Freq: Every day | ORAL | Status: DC
  Administered 2023-12-27 – 2024-01-04 (×9): 1 mg via ORAL
  Filled 2023-12-27 (×9): qty 1

## 2023-12-27 MED ORDER — CHLORDIAZEPOXIDE HCL 25 MG PO CAPS
25.0000 mg | ORAL_CAPSULE | Freq: Two times a day (BID) | ORAL | Status: AC
  Administered 2023-12-29 – 2023-12-30 (×3): 25 mg via ORAL
  Filled 2023-12-27 (×3): qty 1

## 2023-12-27 MED ORDER — LINEZOLID 600 MG/300ML IV SOLN
600.0000 mg | Freq: Once | INTRAVENOUS | Status: AC
  Administered 2023-12-27: 600 mg via INTRAVENOUS
  Filled 2023-12-27: qty 300

## 2023-12-27 MED ORDER — LINEZOLID 600 MG/300ML IV SOLN
600.0000 mg | Freq: Two times a day (BID) | INTRAVENOUS | Status: DC
  Administered 2023-12-27 – 2023-12-31 (×9): 600 mg via INTRAVENOUS
  Filled 2023-12-27 (×10): qty 300

## 2023-12-27 MED ORDER — FENTANYL CITRATE PF 50 MCG/ML IJ SOSY
50.0000 ug | PREFILLED_SYRINGE | INTRAMUSCULAR | Status: DC | PRN
  Administered 2023-12-27: 50 ug via INTRAVENOUS
  Filled 2023-12-27: qty 1

## 2023-12-27 MED ORDER — LORAZEPAM 2 MG/ML IJ SOLN
1.0000 mg | INTRAMUSCULAR | Status: AC | PRN
  Administered 2023-12-27: 4 mg via INTRAVENOUS
  Administered 2023-12-30: 2 mg via INTRAVENOUS
  Filled 2023-12-27: qty 2
  Filled 2023-12-27: qty 1

## 2023-12-27 MED ORDER — ACETAMINOPHEN 650 MG RE SUPP: 650.0000 mg | Freq: Four times a day (QID) | RECTAL | Status: DC | PRN

## 2023-12-27 MED ORDER — LORAZEPAM 2 MG/ML IJ SOLN
1.0000 mg | Freq: Once | INTRAMUSCULAR | Status: AC
  Administered 2023-12-27: 1 mg via INTRAVENOUS
  Filled 2023-12-27: qty 1

## 2023-12-27 MED ORDER — METRONIDAZOLE 500 MG/100ML IV SOLN
500.0000 mg | Freq: Two times a day (BID) | INTRAVENOUS | Status: DC
  Administered 2023-12-27 – 2023-12-31 (×9): 500 mg via INTRAVENOUS
  Filled 2023-12-27 (×10): qty 100

## 2023-12-27 MED ORDER — SODIUM CHLORIDE 0.9% FLUSH
3.0000 mL | Freq: Two times a day (BID) | INTRAVENOUS | Status: DC
  Administered 2023-12-27 – 2024-01-04 (×16): 3 mL via INTRAVENOUS

## 2023-12-27 MED ORDER — SODIUM CHLORIDE 0.9 % IV SOLN
3.0000 g | Freq: Once | INTRAVENOUS | Status: AC
  Administered 2023-12-27: 3 g via INTRAVENOUS
  Filled 2023-12-27: qty 8

## 2023-12-27 MED ORDER — ONDANSETRON HCL 4 MG/2ML IJ SOLN: 4.0000 mg | Freq: Four times a day (QID) | INTRAMUSCULAR | Status: DC | PRN

## 2023-12-27 MED ORDER — CHLORDIAZEPOXIDE HCL 25 MG PO CAPS
25.0000 mg | ORAL_CAPSULE | Freq: Three times a day (TID) | ORAL | Status: AC
  Administered 2023-12-27 – 2023-12-29 (×6): 25 mg via ORAL
  Filled 2023-12-27 (×6): qty 1

## 2023-12-27 MED ORDER — GADOBUTROL 1 MMOL/ML IV SOLN
10.0000 mL | Freq: Once | INTRAVENOUS | Status: AC | PRN
  Administered 2023-12-27: 10 mL via INTRAVENOUS

## 2023-12-27 MED ORDER — POLYETHYLENE GLYCOL 3350 17 G PO PACK: 17.0000 g | PACK | Freq: Every day | ORAL | Status: DC | PRN

## 2023-12-27 NOTE — Assessment & Plan Note (Signed)
 Cell lines are currently at baseline.   - Daily CBC

## 2023-12-27 NOTE — Assessment & Plan Note (Signed)
 Patient was recently admitted for an ulcer of the left second toe s/p amputation on 12 April, now presenting with acute infection.  No evidence of sepsis.  - Podiatry consulted; appreciate their recommendations - MRI with and without contrast ordered - S/p linezolid  and Unasyn  - Continue with linezolid , Rocephin , and Flagyl  - Pain control with oxycodone  and Dilaudid 

## 2023-12-27 NOTE — ED Triage Notes (Signed)
 First Nurse Note: Patient to ED via [facility] from the street for wound infection to left foot. States he had a debridement a few weeks ago for same. Swelling noted to leg and foul odor.  119/68 97% 99.2 140 cbg 89 HR

## 2023-12-27 NOTE — Assessment & Plan Note (Signed)
 Patient endorses intermittent mild red blood on his stool.  Possibly due to to hemorrhoids.  - Monitor for additional episodes - Daily CBC

## 2023-12-27 NOTE — H&P (Signed)
 History and Physical    Patient: Derrick Blanchard:[id number] DOB: 04/12/1981 DOA: 12/27/2023 DOS: the patient was seen and examined on 12/27/2023 PCP: Pcp, No  Patient coming from: Home  Chief Complaint:  Chief Complaint  Patient presents with   Wound Dehiscence   HPI: Derrick Blanchard is a 43 y.o. 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  Derrick Blanchard states that approximately 2-3 days ago, he was walking his dog when he felt a pop sensation and noticed that the wound seemed to open up on the bottom of his left foot.  Since then, he has been having worsening redness, swelling, pain and drainage with a foul odor.  He endorses hot and cold chills, malaise but denies any nausea or vomiting.  He occasionally sees some blood in his stool.  He denies any melena.  He denies any shortness of breath or chest pain.  Derrick Blanchard states that he continues to drink daily, at least several 40 ounces of malt or up to 15 beers per day, depending on what he can afford.  He has withdrawn in the past and already feels as though he is shaky.  ED course: On arrival to the ED, patient was normotensive at 129/81 with heart rate of 81.  He was saturating at 100% on room air.  He was afebrile at 98.9.  Initial workup notable for WBC 2.7, hemoglobin of 9.3, platelets 46, AST 60, GFR above 60.  Left foot x-ray with cortical irregularity, however no osteomyelitis.  Podiatry consulted.  Patient started on Unasyn  and linezolid .  [facility] contacted for admission.  Review of Systems: As mentioned in the history of present illness. All other systems reviewed and are negative.  Past Medical History:  Diagnosis Date   Anemia    Anxiety    Cirrhosis (HCC)    Depression    Heart murmur    Hepatitis    Hypertension    Substance  abuse (HCC)    Past Surgical History:  Procedure Laterality Date   COLONOSCOPY     ESOPHAGOGASTRODUODENOSCOPY (EGD) WITH PROPOFOL  N/A 02/03/2015   Procedure: ESOPHAGOGASTRODUODENOSCOPY (EGD) WITH PROPOFOL ;  Surgeon: Luella Sager, MD;  Location: [facility] ENDOSCOPY;  Service: Endoscopy;  Laterality: N/A;   ESOPHAGOGASTRODUODENOSCOPY (EGD) WITH PROPOFOL  N/A 12/21/2017   Procedure: ESOPHAGOGASTRODUODENOSCOPY (EGD) WITH PROPOFOL ;  Surgeon: Luke Salaam, MD;  Location: [facility] ENDOSCOPY;  Service: Gastroenterology;  Laterality: N/A;   FISSURECTOMY     IRRIGATION AND DEBRIDEMENT FOOT Left 12/16/2023   Procedure: IRRIGATION AND DEBRIDEMENT FOOT, possible sesamoidectomy, bone biopsy;  Surgeon: Reina Cara, DPM;  Location: [facility] ORS;  Service: Podiatry;  Laterality: Left;  Pulse lavage, bone biopsy with jam shidi needle, mini c arm, sagittal saw available   Social History:  reports that he has been smoking cigarettes. He has never used smokeless tobacco. He reports that he does not currently use alcohol. He reports current drug use. Drug: Marijuana.  No Known Allergies  Family History  Problem Relation Age of Onset   Lung cancer Mother    Cirrhosis Father    Lung cancer Paternal Grandfather     Prior to Admission medications   Medication Sig Start Date End Date Taking? Authorizing Provider  folic acid  (FOLVITE ) 1 MG tablet Take 1 tablet (1 mg total) by mouth daily. 12/21/23   Alexander, Natalie, DO  Multiple Vitamin (MULTIVITAMIN WITH MINERALS) TABS tablet Take 1 tablet by mouth daily. 12/22/23   Alexander, Natalie, DO  oxyCODONE  (OXY IR/ROXICODONE ) 5 MG immediate release tablet Take 1-2 tablets (5-10 mg total) by mouth every 6 (six) hours as needed for severe pain (pain score 7-10) or moderate pain (pain score 4-6). 12/21/23   Alexander, Natalie, DO  pantoprazole  (PROTONIX ) 40 MG tablet Take 1 tablet (40 mg total) by mouth daily. 12/21/23   Alexander, Natalie, DO  thiamine  (VITAMIN B-1) 100 MG tablet  Take 1 tablet (100 mg total) by mouth daily. 12/21/23   Melodi Sprung, DO    Physical Exam: Vitals:   12/27/23 1109 12/27/23 1110 12/27/23 1113 12/27/23 1347  BP:   129/81 117/70  Pulse:   81 90  Resp:   18 17  Temp: 98.9 F (37.2 C)     TempSrc: Oral     SpO2:   100% 100%  Weight:  99.8 kg    Height:  5\' 8"  (1.727 m)     Physical Exam Vitals and nursing note reviewed.  Constitutional:      General: He is not in acute distress.    Appearance: He is obese. He is not toxic-appearing.  HENT:     Head: Normocephalic.     Comments: Bruise, left eye    Mouth/Throat:     Mouth: Mucous membranes are moist.     Pharynx: Oropharynx is clear.  Eyes:     Conjunctiva/sclera: Conjunctivae normal.     Pupils: Pupils are equal, round, and reactive to light.  Cardiovascular:     Rate and Rhythm: Normal rate and regular rhythm.     Heart sounds: No murmur heard.    No gallop.  Pulmonary:     Effort: Pulmonary effort is normal. No respiratory distress.     Breath sounds: Normal breath sounds. No wheezing, rhonchi or rales.  Abdominal:     General: Bowel sounds are normal. There is no distension.     Palpations: Abdomen is soft.     Tenderness: There is no abdominal tenderness. There is no guarding.  Musculoskeletal:     Right lower leg: Pitting Edema (Trace) present.     Left lower leg: 1+ Edema present.  Skin:    General: Skin is warm and dry.     Comments: Left foot with large ulceration on the plantar surface at the site of prior sutures.  Left great toe with purulence expressing from around the nail bed.  Erythema extending into the lower calf.  Neurological:     General: No focal deficit present.     Mental Status: He is alert and oriented to person, place, and time. Mental status is at baseline.     Comments: Bilateral tremors  Psychiatric:        Mood and Affect: Mood normal.        Behavior: Behavior normal.        Data Reviewed: CBC with WBC of 2.7, hemoglobin  of 9.3, platelets of 46 CMP with sodium of 141, potassium 3.8, bicarb 25, glucose 118, BUN 8, creatinine 0.65, AST 60, ALT 26, GFR above 60  DG Foot Complete Left Result Date: 12/27/2023 CLINICAL DATA:  Infection status post second toe resection EXAM: LEFT FOOT - COMPLETE 3+ VIEW COMPARISON:  December 13, 2023 FINDINGS: There is no evidence of fracture or dislocation. There is no evidence of arthropathy or other focal bone abnormality. Soft tissues are unremarkable. IMPRESSION: CORTICAL IRREGULARITY IS NOTED WITHIN THE  MEDIAL CORTEX OF THE PROXIMAL PHALANX OF THE FIRST TOE.  COULD CORRELATE WITH RECENT TRAUMA AND SMALL CORTICAL FRACTURE.: IMPRESSION: CORTICAL IRREGULARITY IS NOTED WITHIN THE MEDIAL CORTEX OF THE PROXIMAL PHALANX OF THE FIRST TOE. COULD CORRELATE WITH RECENT TRAUMA AND SMALL CORTICAL FRACTURE. Post prior second toe amputation.  No evidence of osteomyelitis Electronically Signed   By: Fredrich Jefferson M.D.   On: 12/27/2023 11:51   Results are pending, will review when available.  Assessment and Plan:  * Infection of toe as complication of amputation [facility]) Patient was recently admitted for an ulcer of the left second toe s/p amputation on 12 April, now presenting with acute infection.  No evidence of sepsis.  - Podiatry consulted; appreciate their recommendations - MRI with and without contrast ordered - S/p linezolid  and Unasyn  - Continue with linezolid , Rocephin , and Flagyl  - Pain control with oxycodone  and Dilaudid   Alcohol use disorder, severe, dependence (HCC) Patient has an extensive history of alcohol use disorder complicated by cirrhosis.  He notes that he used to drink half a gallon of liquor per day, but now with drinks several 40 ounces or up to 15 beers.  He is in active withdrawal at this time, with high risk for DT.  - Telemetry monitoring - Start CIWA with Ativan  as needed - Start Librium  taper - Daily folic acid , thiamine  and multivitamin  Alcoholic cirrhosis of  liver (HCC) Long-term history of alcohol induced cirrhosis complicated by pancytopenia, portal hypertension gastropathy.  No history of ascites, not currently on diuretics.  Pancytopenia (HCC) Cell lines are currently at baseline.   - Daily CBC  Hematochezia Patient endorses intermittent mild red blood on his stool.  Possibly due to to hemorrhoids.  - Monitor for additional episodes - Daily CBC  Advance Care Planning:   Code Status: Full Code   Consults: Podiatry  Family Communication: no family at bedside  Severity of Illness: The appropriate patient status for this patient is INPATIENT. Inpatient status is judged to be reasonable and necessary in order to provide the required intensity of service to ensure the patient's safety. The patient's presenting symptoms, physical exam findings, and initial radiographic and laboratory data in the context of their chronic comorbidities is felt to place them at high risk for further clinical deterioration. Furthermore, it is not anticipated that the patient will be medically stable for discharge from the hospital within 2 midnights of admission.   * I certify that at the point of admission it is my clinical judgment that the patient will require inpatient hospital care spanning beyond 2 midnights from the point of admission due to high intensity of service, high risk for further deterioration and high frequency of surveillance required.*  Author: Avi Body, MD 12/27/2023 2:03 PM  For on call review [email or web address].

## 2023-12-27 NOTE — Assessment & Plan Note (Signed)
 Long-term history of alcohol induced cirrhosis complicated by pancytopenia, portal hypertension gastropathy.  No history of ascites, not currently on diuretics.

## 2023-12-27 NOTE — Assessment & Plan Note (Signed)
 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  He notes that he used to drink half a gallon of liquor per day, but now with drinks several 40 ounces or up to 15 beers.  He is in active withdrawal at this time, with high risk for DT.  - Telemetry monitoring - Start CIWA with Ativan  as needed - Start Librium  taper - Daily folic acid , thiamine  and multivitamin

## 2023-12-27 NOTE — ED Provider Notes (Signed)
 [facility] Provider Note    Event Date/Time   First MD Initiated Contact with Patient 12/27/23 1135     (approximate)   History   Wound Dehiscence   HPI  Derrick Blanchard is a 43 y.o. male who presents with swelling increased pain, foul smell, redness from surgical site.  Review of record demonstrates the patient had procedure with Dr. Marvis Sluder of podiatry on April 12, at the time there was concern for possible osteomyelitis.  Irrigation and debridement was performed.  Patient had been doing okay but recently developed increased pain redness swelling discharge from the surgical site     Physical Exam   Triage Vital Signs: ED Triage Vitals  Encounter Vitals Group     BP 12/27/23 1113 129/81     Systolic BP Percentile --      Diastolic BP Percentile --      Pulse Rate 12/27/23 1113 81     Resp 12/27/23 1113 18     Temp 12/27/23 1109 98.9 F (37.2 C)     Temp Source 12/27/23 1109 Oral     SpO2 12/27/23 1113 100 %     Weight 12/27/23 1110 99.8 kg (220 lb)     Height 12/27/23 1110 1.727 m (5\' 8" )     Head Circumference --      Peak Flow --      Pain Score 12/27/23 1110 10     Pain Loc --      Pain Education --      Exclude from Growth Chart --     Most recent vital signs: Vitals:   12/27/23 1109 12/27/23 1113  BP:  129/81  Pulse:  81  Resp:  18  Temp: 98.9 F (37.2 C)   SpO2:  100%     General: Awake, no distress.  CV:  Good peripheral perfusion.  Resp:  Normal effort.  Abd:  No distention.  Other:     ED Results / Procedures / Treatments   Labs (all labs ordered are listed, but only abnormal results are displayed) Labs Reviewed  COMPREHENSIVE METABOLIC PANEL WITH GFR - Abnormal; Notable for the following components:      Result Value   Glucose, Bld 118 (*)    Calcium  8.8 (*)    Total Protein 6.4 (*)    Albumin 3.1 (*)    AST 60 (*)    Total Bilirubin 2.0 (*)    All other components within normal limits  CBC WITH  DIFFERENTIAL/PLATELET - Abnormal; Notable for the following components:   WBC 2.7 (*)    RBC 3.59 (*)    Hemoglobin 9.3 (*)    HCT 29.4 (*)    MCH 25.9 (*)    RDW 18.2 (*)    Platelets 46 (*)    Lymphs Abs 0.4 (*)    All other components within normal limits  LACTIC ACID, PLASMA  LACTIC ACID, PLASMA     EKG     RADIOLOGY     PROCEDURES:  Critical Care performed:   Procedures   MEDICATIONS ORDERED IN ED: Medications  Ampicillin -Sulbactam (UNASYN ) 3 g in sodium chloride  0.9 % 100 mL IVPB (3 g Intravenous New Bag/Given 12/27/23 1221)    And  linezolid  (ZYVOX ) IVPB 600 mg (600 mg Intravenous New Bag/Given 12/27/23 1218)  fentaNYL  (SUBLIMAZE ) injection 50 mcg (50 mcg Intravenous Given 12/27/23 1214)     IMPRESSION / MDM / ASSESSMENT AND PLAN / ED COURSE  I reviewed the  triage vital signs and the nursing notes. Patient's presentation is most consistent with acute presentation with potential threat to life or bodily function.  Patient presents with pain redness swelling foul smell consistent with wound infection/surgical site infection.  He is afebrile without signs of sepsis lab work overall reassuring.  Discussed with Dr. Rosemarie Conquest who will see the patient  Broad-spectrum IV antibiotics ordered  I have discussed with the hospitalist for admission        FINAL CLINICAL IMPRESSION(S) / ED DIAGNOSES   Final diagnoses:  Wound infection after surgery     Rx / DC Orders   ED Discharge Orders     None        Note:  This document was prepared using Dragon voice recognition software and may include unintentional dictation errors.   Bryson Carbine, MD 12/27/23 1246

## 2023-12-27 NOTE — ED Triage Notes (Signed)
 Refer to first nurse note. Pt had a procedure on left foot a few weeks ago. The wound has since opened while sutures are still in place. Foul smell noted.  Pt reports chills.

## 2023-12-28 ENCOUNTER — Encounter: Admitting: Podiatry

## 2023-12-28 DIAGNOSIS — T8740 Infection of amputation stump, unspecified extremity: Secondary | ICD-10-CM | POA: Diagnosis not present

## 2023-12-28 LAB — MAGNESIUM: Magnesium: 1.5 mg/dL — ABNORMAL LOW (ref 1.7–2.4)

## 2023-12-28 LAB — COMPREHENSIVE METABOLIC PANEL WITH GFR
ALT: 21 U/L (ref 0–44)
AST: 42 U/L — ABNORMAL HIGH (ref 15–41)
Albumin: 2.5 g/dL — ABNORMAL LOW (ref 3.5–5.0)
Alkaline Phosphatase: 81 U/L (ref 38–126)
Anion gap: 5 (ref 5–15)
BUN: 9 mg/dL (ref 6–20)
CO2: 22 mmol/L (ref 22–32)
Calcium: 8 mg/dL — ABNORMAL LOW (ref 8.9–10.3)
Chloride: 110 mmol/L (ref 98–111)
Creatinine, Ser: 0.59 mg/dL — ABNORMAL LOW (ref 0.61–1.24)
GFR, Estimated: >60 mL/min (ref >60)
Glucose, Bld: 98 mg/dL (ref 70–99)
Potassium: 3.4 mmol/L — ABNORMAL LOW (ref 3.5–5.1)
Sodium: 137 mmol/L (ref 135–145)
Total Bilirubin: 1.8 mg/dL — ABNORMAL HIGH (ref 0.0–1.2)
Total Protein: 5.5 g/dL — ABNORMAL LOW (ref 6.5–8.1)

## 2023-12-28 LAB — PHOSPHORUS: Phosphorus: 3.3 mg/dL (ref 2.5–4.6)

## 2023-12-28 LAB — CBC
HCT: 25.9 % — ABNORMAL LOW (ref 39.0–52.0)
Hemoglobin: 8.2 g/dL — ABNORMAL LOW (ref 13.0–17.0)
MCH: 25.7 pg — ABNORMAL LOW (ref 26.0–34.0)
MCHC: 31.7 g/dL (ref 30.0–36.0)
MCV: 81.2 fL (ref 80.0–100.0)
Platelets: 37 10*3/uL — ABNORMAL LOW (ref 150–400)
RBC: 3.19 MIL/uL — ABNORMAL LOW (ref 4.22–5.81)
RDW: 17.8 % — ABNORMAL HIGH (ref 11.5–15.5)
WBC: 1.3 10*3/uL — CL (ref 4.0–10.5)
nRBC: 0 % (ref 0.0–0.2)

## 2023-12-28 LAB — SEDIMENTATION RATE: Sed Rate: 29 mm/h — ABNORMAL HIGH (ref 0–15)

## 2023-12-28 LAB — C-REACTIVE PROTEIN: CRP: 2.1 mg/dL — ABNORMAL HIGH (ref <1.0)

## 2023-12-28 MED ORDER — POTASSIUM CHLORIDE CRYS ER 20 MEQ PO TBCR
40.0000 meq | EXTENDED_RELEASE_TABLET | Freq: Once | ORAL | Status: AC
  Administered 2023-12-28: 40 meq via ORAL
  Filled 2023-12-28: qty 2

## 2023-12-28 MED ORDER — MAGNESIUM SULFATE 2 GM/50ML IV SOLN
2.0000 g | Freq: Once | INTRAVENOUS | Status: AC
  Administered 2023-12-28: 2 g via INTRAVENOUS
  Filled 2023-12-28: qty 50

## 2023-12-28 NOTE — Consult Note (Signed)
 PODIATRY CONSULTATION  NAME Derrick Blanchard MRN [id number] DOB 08-16-1981 DOA 12/27/2023   Reason for consult:  Chief Complaint  Patient presents with   Wound Dehiscence    Attending/Consulting physician: B. Ayiku MD  History of present illness: "Derrick Blanchard is a 43 y.o. 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   Derrick Blanchard states that approximately 2-3 days ago, he was walking his dog when he felt a pop sensation and noticed that the wound seemed to open up on the bottom of his left foot.  Since then, he has been having worsening redness, swelling, pain and drainage with a foul odor.  He endorses hot and cold chills, malaise but denies any nausea or vomiting.  He occasionally sees some blood in his stool.  He denies any melena.  He denies any shortness of breath or chest pain.   Derrick Blanchard states that he continues to drink daily, at least several 40 ounces of malt or up to 15 beers per day, depending on what he can afford.  He has withdrawn in the past and already feels as though he is shaky."   Recently admitted here and underwent surgery with Dr. Marvis Sluder 4/18 for I&D and bone biopsy. Pathology results negative for osteomyelitis at the time.  Appears this was ultimately not successful at obtaining source control as he has had drainage edema and pain in the great toe. Says there is yellow drainage coming from the toe.     Past Medical History:  Diagnosis Date   Anemia    Anxiety    Cirrhosis (HCC)    Depression    Heart murmur    Hepatitis    Hypertension    Substance abuse (HCC)        Latest Ref Rng & Units 12/28/2023    6:19 AM 12/27/2023   11:14 AM 12/21/2023    3:35 AM  CBC  WBC 4.0 - 10.5 K/uL 1.3  2.7  2.2   Hemoglobin 13.0 - 17.0 g/dL 8.2  9.3  9.4   Hematocrit 39.0 -  52.0 % 25.9  29.4  30.3   Platelets 150 - 400 K/uL 37  46  51        Latest Ref Rng & Units 12/28/2023    6:19 AM 12/27/2023   11:14 AM 12/21/2023    3:35 AM  BMP  Glucose 70 - 99 mg/dL 98  409  811   BUN 6 - 20 mg/dL 9  8  9    Creatinine 0.61 - 1.24 mg/dL 9.14  7.82  9.56   Sodium 135 - 145 mmol/L 137  141  138   Potassium 3.5 - 5.1 mmol/L 3.4  3.8  4.2   Chloride 98 - 111 mmol/L 110  111  107   CO2 22 - 32 mmol/L 22  25  25    Calcium  8.9 - 10.3 mg/dL 8.0  8.8  8.7       Physical Exam: Lower Extremity Exam Right foot TMA  Left foot dehiscence of surgical site plantar 1st MPJ with erythema and significant edema of 1st MPJ - drainage from ulcer and hallux extensus deformity       Dp and PT pulses palpable SILT   ASSESSMENT/PLAN OF CARE 43 y.o. 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  MRI L foot W WO contrast:  Soft tissue defect plantar 1st MPJ, marrow edema lateral hallux sesamoid increased from prior, marrow abnormality distal 1st met as well as proximal phalanx post surgical vs osteomyelitis.  ESR/CRP: p  - Discussed findings with patient. Given dehiscence and concern for worsening osseous infection despite abx therapy (in some part due to patient non compliance with dressing care/WB status I suspect) I recommend partial first ray amputation for hopeful source control in the left foot. I fear that more conservative measures will be unsuccessful. He agrees to proceed. NPO past MN for OR tomorrow afternoon for L partial first ray amp, possible abx beads/graft. He agrees to proceed. - Continue IV abx broad spectrum pending further culture data - Anticoagulation: HOLD prior to OR - Wound care: Betadine wet to dry pre op - WB status: NWB LLE - Will continue  to follow   Thank you for the consult.  Please contact me directly with any questions or concerns.           Maridee Shoemaker, DPM [facility] / [facility]    [address] [place], [place] [address]                Office [phone]  Fax [phone]

## 2023-12-28 NOTE — Plan of Care (Signed)
  Problem: Health Behavior/Discharge Planning: Goal: Ability to manage health-related needs will improve Outcome: Progressing   Problem: Clinical Measurements: Goal: Ability to maintain clinical measurements within normal limits will improve Outcome: Progressing   Problem: Activity: Goal: Risk for activity intolerance will decrease Outcome: Progressing   Problem: Coping: Goal: Level of anxiety will decrease Outcome: Progressing   

## 2023-12-28 NOTE — TOC Initial Note (Signed)
 Transition of Care [facility]) - Initial/Assessment Note    Patient Details  Name: Derrick Blanchard MRN: [id number] Date of Birth: 11-13-1980  Transition of Care [facility]) CM/SW Contact:    Crayton Docker, RN Phone Number: 12/28/2023, 4:39 PM  Clinical Narrative:                  CM to patient's room regarding TOC screening assessment. CM introduced case management role and discharge care planning process. Per patient, not sure where he will go after discharge. Per patient uses a walker for mobility. Patient states usually has a copay of $1.25 to $4.00 for prescription medications.  Patient uses outpatient [facility] for prescriptions.    Barriers to Discharge: Continued Medical Work up   Patient Goals and CMS Choice    Per patient comment, "Not sure yet"   Expected Discharge Plan and Services   Discharge Planning Services: CM Consult    Home/self care       Prior Living Arrangements/Services     Patient language and need for interpreter reviewed:: No Do you feel safe going back to the place where you live?: Yes      Need for Family Participation in Patient Care: Yes (Comment) Care giver support system in place?: No (comment) Current home services: DME (walker) Criminal Activity/Legal Involvement Pertinent to Current Situation/Hospitalization: No - Comment as needed  Activities of Daily Living   ADL Screening (condition at time of admission) Independently performs ADLs?: Yes (appropriate for developmental age) Is the patient deaf or have difficulty hearing?: No Does the patient have difficulty seeing, even when wearing glasses/contacts?: No Does the patient have difficulty concentrating, remembering, or making decisions?: No  Permission Sought/Granted Permission sought to share information with : Case Manager, Family Supports Permission granted to share information with : Yes, Verbal Permission Granted    Emotional Assessment Appearance:: Appears stated  age Attitude/Demeanor/Rapport: Engaged Affect (typically observed): Calm Orientation: : Oriented to Self, Oriented to Place, Oriented to  Time, Oriented to Situation Alcohol / Substance Use: Alcohol Use, Illicit Drugs Psych Involvement: No (comment)  Admission diagnosis:  Wound infection after surgery [T81.49XA] Infection of toe as complication of amputation (HCC) [T87.40] Patient Active Problem List   Diagnosis Date Noted   Infection of toe as complication of amputation (HCC) 12/27/2023   Wound of left foot 12/13/2023   Open wound of left lower extremity 12/13/2023   ETOH abuse 12/13/2023   Pyogenic inflammation of bone (HCC) 12/13/2023   Hypomagnesemia 03/13/2023   Diarrhea 03/12/2023   Severe sepsis (HCC) 03/11/2023   Alcoholic cirrhosis of liver (HCC) 03/11/2023   Chronic hepatitis C (HCC) 03/11/2023   Cellulitis of left lower extremity 03/11/2023   Ulcer of left foot with muscle involvement without evidence of necrosis (HCC) 03/11/2023   Nausea and vomiting 03/11/2023   Portal hypertension with esophageal varices (HCC) 11/27/2019   Thrombocytopenia, acquired (HCC) 11/27/2019   GERD (gastroesophageal reflux disease) 11/24/2017   Tachycardia 11/24/2017   Hypokalemia 10/25/2017   Thrombocytopenia (HCC) 10/25/2017   Advance care planning 10/25/2017   Depression, major, single episode, moderate (HCC) 10/25/2017   Elevated liver enzymes 09/28/2017   Hyperbilirubinemia    Alcohol use disorder, severe, dependence (HCC) 02/03/2015   Alcoholic hepatitis 01/30/2015   Hematochezia 01/30/2015   Coagulopathy (HCC) 01/30/2015   Hyponatremia 01/30/2015   Pancytopenia (HCC) 01/30/2015   PCP:  Vicente Graham, No Pharmacy:   [facility] [address] [place] [address] Phone: [phone] Fax:  [phone]  [facility] [address] ([address]), [place] - [address] [place] ([address]) [place] [address] Phone:  [phone] Fax: [phone]  [facility] - [place], [place] - [address] [place] [address] Phone: [phone] Fax: [phone]     Social Drivers of Health (SDOH) Social History: SDOH Screenings   Food Insecurity: Food Insecurity Present (12/27/2023)  Housing: High Risk (12/27/2023)  Transportation Needs: Unmet Transportation Needs (12/27/2023)  Utilities: At Risk (12/27/2023)  Financial Resource Strain: High Risk (03/22/2022)   Received from [facility], [facility]  Social Connections: Patient Declined (12/27/2023)  Tobacco Use: High Risk (12/27/2023)   SDOH Interventions:     Readmission Risk Interventions     No data to display

## 2023-12-28 NOTE — Progress Notes (Addendum)
 Progress Note    Derrick Blanchard  VHQ:[id number] DOB: 04-Mar-1981  DOA: 12/27/2023 PCP: Pcp, No      Brief Narrative:    Medical records reviewed and are as summarized below:  Derrick Blanchard is a 42 y.o. 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  About 3 days prior to admission, he was walking his dog when he felt a pop sensation and noticed that the wound on his left foot had opened up.  He noticed redness, swelling, increasing pain and foul-smelling drainage from the wound on the foot and left big toe.  He also complained of shakiness and was concerned about alcohol withdrawal.      Assessment/Plan:   Principal Problem:   Infection of toe as complication of amputation (HCC) Active Problems:   Alcohol use disorder, severe, dependence (HCC)   Alcoholic cirrhosis of liver (HCC)   Pancytopenia (HCC)   Hematochezia   Body mass index is 33.45 kg/m.  (Obesity)    Infection of toe as complication of amputation [facility]) Patient was recently admitted for an ulcer of the left second toe s/p amputation on 12 April, now presenting with acute infection.  No evidence of sepsis. MRI concerning for osteomyelitis Continue empiric IV antibiotics (ceftriaxone , Flagyl  and Zyvox ). Plan for left partial first ray amputation on 12/29/2023. Case gust with Dr. Rosemarie Conquest, podiatrist, at the bedside    Alcohol use disorder, severe, dependence (HCC) Patient has an extensive history of alcohol use disorder complicated by cirrhosis.  He notes that he used to drink half a gallon of liquor per day, but now with drinks several 40 ounces or up to 15 beers.  Continue Librium  taper. Use Ativan  as needed per CIWA protocol. Continue multivitamins and folic acid      Alcoholic cirrhosis of liver (HCC) Compensated    Pancytopenia (HCC) Cell lines are currently at baseline.  Monitor CBC    Hematochezia Patient endorses intermittent mild red blood on his stool.  Possibly due to to hemorrhoids. Hemoglobin down from 9.3-8.2. No indication for blood transfusion at this time.    Hypokalemia, hypomagnesemia Replete potassium with oral potassium chloride  Replete magnesium  with IV magnesium  sulfate Monitor electrolytes and replete as needed    Diet Order             Diet NPO time specified Except for: Sips with Meds  Diet effective midnight           Diet Carb Modified Fluid consistency: Thin; Room service appropriate? Yes  Diet effective now                            Consultants: Podiatrist  Procedures: None    Medications:    chlordiazePOXIDE   25 mg Oral TID   Followed by   Cecily Cohen ON 12/29/2023] chlordiazePOXIDE   25 mg Oral BID   Followed by   Cecily Cohen ON 12/31/2023] chlordiazePOXIDE   25 mg Oral Daily   folic acid   1 mg Oral Daily   multivitamin with minerals  1 tablet Oral Daily   sodium chloride  flush  3 mL Intravenous Q12H   thiamine   100 mg Oral Daily   Or   thiamine   100 mg Intravenous Daily   Continuous Infusions:  cefTRIAXone  (ROCEPHIN )  IV 2 g (  12/27/23 1746)   linezolid  (ZYVOX ) IV 600 mg (12/27/23 2237)   metronidazole  500 mg (12/28/23 1003)     Anti-infectives (From admission, onward)    Start     Dose/Rate Route Frequency Ordered Stop   12/27/23 2200  linezolid  (ZYVOX ) IVPB 600 mg        600 mg 300 mL/hr over 60 Minutes Intravenous Every 12 hours 12/27/23 1319     12/27/23 2200  metroNIDAZOLE  (FLAGYL ) IVPB 500 mg        500 mg 100 mL/hr over 60 Minutes Intravenous Every 12 hours 12/27/23 1321     12/27/23 1800  cefTRIAXone  (ROCEPHIN ) 2 g in sodium chloride  0.9 % 100 mL IVPB        2 g 200 mL/hr over 30 Minutes Intravenous Every 24 hours 12/27/23 1321     12/27/23 1200  Ampicillin -Sulbactam  (UNASYN ) 3 g in sodium chloride  0.9 % 100 mL IVPB       Placed in "And" Linked Group   3 g 200 mL/hr over 30 Minutes Intravenous  Once 12/27/23 1152 12/27/23 1300   12/27/23 1200  linezolid  (ZYVOX ) IVPB 600 mg       Placed in "And" Linked Group   600 mg 300 mL/hr over 60 Minutes Intravenous  Once 12/27/23 1152 12/27/23 1328              Family Communication/Anticipated D/C date and plan/Code Status   DVT prophylaxis: SCDs Start: 12/27/23 1307     Code Status: Full Code  Family Communication: None Disposition Plan: Plan to discharge home   Status is: Inpatient Remains inpatient appropriate because: Left foot infection       Subjective:   Interval events noted.  He complains of pain in the left foot.  Objective:    Vitals:   12/28/23 0200 12/28/23 0418 12/28/23 0823 12/28/23 1118  BP: 120/69 125/81 107/71 105/82  Pulse: 81 92 98 84  Resp:  18 18 17   Temp:  98.8 F (37.1 C) 98.5 F (36.9 C) 98.1 F (36.7 C)  TempSrc:   Oral   SpO2:  98% 99% 99%  Weight:      Height:       No data found.   Intake/Output Summary (Last 24 hours) at 12/28/2023 1127 Last data filed at 12/28/2023 0824 Gross per 24 hour  Intake 733.97 ml  Output 1840 ml  Net -1106.03 ml   Filed Weights   12/27/23 1110  Weight: 99.8 kg    Exam:  GEN: NAD SKIN: Warm and dry EYES: No pallor or icterus ENT: MMM CV: RRR PULM: CTA B ABD: soft, ND, NT, +BS CNS: AAO x 3, non focal EXT: Wound dehiscence on the plantar aspect of left first MTP joint.  Wound is foul-smelling.        Data Reviewed:   I have personally reviewed following labs and imaging studies:  Labs: Labs show the following:   Basic Metabolic Panel: Recent Labs  Lab 12/27/23 1114 12/28/23 0615 12/28/23 0619  NA 141  --  137  K 3.8  --  3.4*  CL 111  --  110  CO2 25  --  22  GLUCOSE 118*  --  98  BUN 8  --  9  CREATININE 0.65  --  0.59*  CALCIUM  8.8*  --  8.0*  MG 1.7 1.5*  --   PHOS  --  3.3   --    GFR Estimated Creatinine Clearance: 140.6 mL/min (A) (by C-G  formula based on SCr of 0.59 mg/dL (L)). Liver Function Tests: Recent Labs  Lab 12/27/23 1114 12/28/23 0619  AST 60* 42*  ALT 26 21  ALKPHOS 98 81  BILITOT 2.0* 1.8*  PROT 6.4* 5.5*  ALBUMIN 3.1* 2.5*   No results for input(s): "LIPASE", "AMYLASE" in the last 168 hours. No results for input(s): "AMMONIA" in the last 168 hours. Coagulation profile No results for input(s): "INR", "PROTIME" in the last 168 hours.  CBC: Recent Labs  Lab 12/27/23 1114 12/28/23 0619  WBC 2.7* 1.3*  NEUTROABS 1.9  --   HGB 9.3* 8.2*  HCT 29.4* 25.9*  MCV 81.9 81.2  PLT 46* 37*   Cardiac Enzymes: No results for input(s): "CKTOTAL", "CKMB", "CKMBINDEX", "TROPONINI" in the last 168 hours. BNP (last 3 results) No results for input(s): "PROBNP" in the last 8760 hours. CBG: No results for input(s): "GLUCAP" in the last 168 hours. D-Dimer: No results for input(s): "DDIMER" in the last 72 hours. Hgb A1c: No results for input(s): "HGBA1C" in the last 72 hours. Lipid Profile: No results for input(s): "CHOL", "HDL", "LDLCALC", "TRIG", "CHOLHDL", "LDLDIRECT" in the last 72 hours. Thyroid function studies: No results for input(s): "TSH", "T4TOTAL", "T3FREE", "THYROIDAB" in the last 72 hours.  Invalid input(s): "FREET3" Anemia work up: No results for input(s): "VITAMINB12", "FOLATE", "FERRITIN", "TIBC", "IRON", "RETICCTPCT" in the last 72 hours. Sepsis Labs: Recent Labs  Lab 12/27/23 1114 12/27/23 1424 12/28/23 0619  WBC 2.7*  --  1.3*  LATICACIDVEN 1.4 1.9  --     Microbiology No results found for this or any previous visit (from the past 240 hours).  Procedures and diagnostic studies:  MR FOOT LEFT W WO CONTRAST Result Date: 12/27/2023 CLINICAL DATA:  Status post incision and drainage on 12/16/2023 with increased pain and swelling. EXAM: MRI OF THE LEFT FOREFOOT WITHOUT AND WITH CONTRAST TECHNIQUE: Multiplanar,  multisequence MR imaging of the left forefoot was performed both before and after administration of intravenous contrast. CONTRAST:  10mL GADAVIST  GADOBUTROL  1 MMOL/ML IV SOLN COMPARISON:  Left foot radiographs dated 12/27/2023. MRI of the left foot dated 12/13/2023. FINDINGS: Bones/Joint/Cartilage Status post interval incision and drainage with new soft tissue defect at the medial plantar forefoot, overlying the proximal to mid great toe. This soft tissue defect/wound measures up to 4.2 cm AP x 4.1 cm TR x 1.8 cm CC and extends deep to the level of the plantar margin of the flexor hallucis longus tendon at the level of the first MTP joint as well as the plantar surface of the lateral hallux sesamoid which demonstrates marrow signal abnormality with corresponding enhancement, increased since the prior exam, concerning for osteomyelitis. There is marrow signal abnormality with associated enhancement of the first proximal phalanx and the distal 2/3 of the first metatarsal. Focal region of T2 hyperintense marrow signal abnormality within the lateral aspect of the first metatarsal head with peripheral enhancement (series 12, image 15 and series 5, image 23). These findings could relate to interval postsurgical change, however, osteomyelitis can not be excluded. The soft tissue defect/wound appears to extend deep to the medial plantar margin of the second metatarsal head which demonstrates marrow signal abnormality with mild enhancement, which could reflect interval postsurgical change, however, osteomyelitis can not be excluded. Prior amputation of the second digit the level of second MTP joint. Mild degenerative changes of the first MTP joint. No significant joint effusion. Ligaments Lisfranc ligament is intact. Muscles and Tendons Flexor and extensor compartments are intact. Trace tenosynovitis of the  flexor hallucis longus tendon. Diffuse atrophy of the intrinsic musculature of the foot, likely secondary to chronic  denervation changes. Soft tissue Status post interval incision and drainage with large soft tissue defect at the medial plantar forefoot, as described above, with surrounding edema and enhancement. No loculated fluid collection. IMPRESSION: 1. Status post interval incision and drainage with new large soft tissue defect at the medial plantar forefoot, overlying the proximal to mid great toe, measuring up to 4.2 x 4.1 x 1.8 cm. This collection extends deep to the plantar surface of the lateral hallux sesamoid, which demonstrates marrow signal abnormality with corresponding enhancement, increased since the prior exam, concerning for osteomyelitis. 2. Marrow signal abnormality with associated enhancement of the first proximal phalanx and the distal 2/3 of the first metatarsal. These findings could relate to interval postsurgical change, however, osteomyelitis can not be excluded. 3. The soft tissue defect/wound appears to extend deep to the medial plantar margin of the second metatarsal head which demonstrates marrow signal abnormality with mild enhancement, which could reflect interval postsurgical change, however, osteomyelitis can not be excluded. 4. Prior amputation of second ray at the level of the second MTP joint. Electronically Signed   By: Mannie Seek M.D.   On: 12/27/2023 17:23   DG Foot Complete Left Result Date: 12/27/2023 CLINICAL DATA:  Infection status post second toe resection EXAM: LEFT FOOT - COMPLETE 3+ VIEW COMPARISON:  December 13, 2023 FINDINGS: There is no evidence of fracture or dislocation. There is no evidence of arthropathy or other focal bone abnormality. Soft tissues are unremarkable. IMPRESSION: CORTICAL IRREGULARITY IS NOTED WITHIN THE MEDIAL CORTEX OF THE PROXIMAL PHALANX OF THE FIRST TOE.  COULD CORRELATE WITH RECENT TRAUMA AND SMALL CORTICAL FRACTURE.: IMPRESSION: CORTICAL IRREGULARITY IS NOTED WITHIN THE MEDIAL CORTEX OF THE PROXIMAL PHALANX OF THE FIRST TOE. COULD CORRELATE WITH  RECENT TRAUMA AND SMALL CORTICAL FRACTURE. Post prior second toe amputation.  No evidence of osteomyelitis Electronically Signed   By: Fredrich Jefferson M.D.   On: 12/27/2023 11:51               LOS: 1 day   Jamis Kryder  [facility]   Pager on [email or web address]. If 7PM-7AM, please contact night-coverage at [email or web address]     12/28/2023, 11:27 AM

## 2023-12-28 NOTE — Plan of Care (Signed)

## 2023-12-29 ENCOUNTER — Inpatient Hospital Stay: Payer: Self-pay | Admitting: Anesthesiology

## 2023-12-29 ENCOUNTER — Encounter
Admission: EM | Disposition: A | Discharge status: Home / Self Care | Payer: Self-pay | Source: Home / Self Care | Attending: Internal Medicine

## 2023-12-29 ENCOUNTER — Encounter: Payer: Self-pay | Admitting: Internal Medicine

## 2023-12-29 ENCOUNTER — Other Ambulatory Visit: Payer: Self-pay

## 2023-12-29 ENCOUNTER — Inpatient Hospital Stay

## 2023-12-29 DIAGNOSIS — M86172 Other acute osteomyelitis, left ankle and foot: Secondary | ICD-10-CM | POA: Diagnosis not present

## 2023-12-29 DIAGNOSIS — T8740 Infection of amputation stump, unspecified extremity: Secondary | ICD-10-CM | POA: Diagnosis not present

## 2023-12-29 HISTORY — PX: AMPUTATION: SHX166

## 2023-12-29 LAB — BASIC METABOLIC PANEL WITH GFR
Anion gap: 4 — ABNORMAL LOW (ref 5–15)
BUN: 8 mg/dL (ref 6–20)
CO2: 23 mmol/L (ref 22–32)
Calcium: 8.1 mg/dL — ABNORMAL LOW (ref 8.9–10.3)
Chloride: 108 mmol/L (ref 98–111)
Creatinine, Ser: 0.6 mg/dL — ABNORMAL LOW (ref 0.61–1.24)
GFR, Estimated: >60 mL/min (ref >60)
Glucose, Bld: 106 mg/dL — ABNORMAL HIGH (ref 70–99)
Potassium: 3.6 mmol/L (ref 3.5–5.1)
Sodium: 135 mmol/L (ref 135–145)

## 2023-12-29 LAB — TYPE AND SCREEN
ABO/RH(D): O POS
Antibody Screen: NEGATIVE

## 2023-12-29 LAB — CBC WITH DIFFERENTIAL/PLATELET
Abs Immature Granulocytes: 0.01 10*3/uL (ref 0.00–0.07)
Basophils Absolute: 0 10*3/uL (ref 0.0–0.1)
Basophils Relative: 1 %
Eosinophils Absolute: 0 10*3/uL (ref 0.0–0.5)
Eosinophils Relative: 3 %
HCT: 26.2 % — ABNORMAL LOW (ref 39.0–52.0)
Hemoglobin: 8.3 g/dL — ABNORMAL LOW (ref 13.0–17.0)
Immature Granulocytes: 1 %
Lymphocytes Relative: 20 %
Lymphs Abs: 0.3 10*3/uL — ABNORMAL LOW (ref 0.7–4.0)
MCH: 26 pg (ref 26.0–34.0)
MCHC: 31.7 g/dL (ref 30.0–36.0)
MCV: 82.1 fL (ref 80.0–100.0)
Monocytes Absolute: 0.3 10*3/uL (ref 0.1–1.0)
Monocytes Relative: 20 %
Neutro Abs: 0.9 10*3/uL — ABNORMAL LOW (ref 1.7–7.7)
Neutrophils Relative %: 55 %
Platelets: 44 10*3/uL — ABNORMAL LOW (ref 150–400)
RBC: 3.19 MIL/uL — ABNORMAL LOW (ref 4.22–5.81)
RDW: 17.6 % — ABNORMAL HIGH (ref 11.5–15.5)
WBC: 1.5 10*3/uL — ABNORMAL LOW (ref 4.0–10.5)
nRBC: 0 % (ref 0.0–0.2)

## 2023-12-29 LAB — MAGNESIUM: Magnesium: 1.6 mg/dL — ABNORMAL LOW (ref 1.7–2.4)

## 2023-12-29 LAB — PHOSPHORUS: Phosphorus: 3.3 mg/dL (ref 2.5–4.6)

## 2023-12-29 SURGERY — AMPUTATION, FOOT, RAY
Anesthesia: General | Laterality: Left

## 2023-12-29 MED ORDER — LIDOCAINE HCL (PF) 1 % IJ SOLN
INTRAMUSCULAR | Status: AC
  Filled 2023-12-29: qty 30

## 2023-12-29 MED ORDER — SODIUM CHLORIDE 0.9 % IR SOLN
Status: DC | PRN
  Administered 2023-12-29: 2600 mL

## 2023-12-29 MED ORDER — SODIUM CHLORIDE 0.9 % IV SOLN: INTRAVENOUS | Status: DC | PRN

## 2023-12-29 MED ORDER — MIDAZOLAM HCL 2 MG/2ML IJ SOLN
INTRAMUSCULAR | Status: AC
  Filled 2023-12-29: qty 2

## 2023-12-29 MED ORDER — VANCOMYCIN HCL 1000 MG IV SOLR
INTRAVENOUS | Status: DC | PRN
  Administered 2023-12-29: 1000 mg via TOPICAL

## 2023-12-29 MED ORDER — TOBRAMYCIN SULFATE 80 MG/2ML IJ SOLN
INTRAMUSCULAR | Status: AC
  Filled 2023-12-29: qty 4

## 2023-12-29 MED ORDER — TOBRAMYCIN SULFATE 80 MG/2ML IJ SOLN
INTRAMUSCULAR | Status: DC | PRN
  Administered 2023-12-29: 120 mg via INTRAMUSCULAR

## 2023-12-29 MED ORDER — SENNOSIDES-DOCUSATE SODIUM 8.6-50 MG PO TABS
1.0000 | ORAL_TABLET | Freq: Every day | ORAL | Status: DC
  Administered 2023-12-29 – 2024-01-03 (×4): 1 via ORAL
  Filled 2023-12-29 (×5): qty 1

## 2023-12-29 MED ORDER — PROPOFOL 1000 MG/100ML IV EMUL
INTRAVENOUS | Status: AC
Start: 2023-12-29 — End: ?
  Filled 2023-12-29: qty 100

## 2023-12-29 MED ORDER — DEXMEDETOMIDINE HCL IN NACL 80 MCG/20ML IV SOLN
INTRAVENOUS | Status: AC
  Filled 2023-12-29: qty 20

## 2023-12-29 MED ORDER — PROPOFOL 10 MG/ML IV BOLUS
INTRAVENOUS | Status: AC
  Filled 2023-12-29: qty 20

## 2023-12-29 MED ORDER — HYDROMORPHONE HCL 1 MG/ML IJ SOLN
INTRAMUSCULAR | Status: DC | PRN
  Administered 2023-12-29: .5 mg via INTRAVENOUS

## 2023-12-29 MED ORDER — PROPOFOL 10 MG/ML IV BOLUS
INTRAVENOUS | Status: DC | PRN
  Administered 2023-12-29: 100 mg via INTRAVENOUS

## 2023-12-29 MED ORDER — OXYCODONE HCL 5 MG PO TABS: 5.0000 mg | ORAL_TABLET | Freq: Once | ORAL | Status: DC | PRN

## 2023-12-29 MED ORDER — DROPERIDOL 2.5 MG/ML IJ SOLN: 0.6250 mg | Freq: Once | INTRAMUSCULAR | Status: DC | PRN

## 2023-12-29 MED ORDER — LIDOCAINE HCL 1 % IJ SOLN
INTRAMUSCULAR | Status: DC | PRN
  Administered 2023-12-29: 10 mL

## 2023-12-29 MED ORDER — VANCOMYCIN HCL 1000 MG IV SOLR
INTRAVENOUS | Status: AC
  Filled 2023-12-29: qty 20

## 2023-12-29 MED ORDER — POLYETHYLENE GLYCOL 3350 17 G PO PACK
17.0000 g | PACK | Freq: Every day | ORAL | Status: DC
  Administered 2023-12-29 – 2024-01-01 (×2): 17 g via ORAL
  Filled 2023-12-29 (×5): qty 1

## 2023-12-29 MED ORDER — BUPIVACAINE HCL (PF) 0.5 % IJ SOLN
INTRAMUSCULAR | Status: AC
  Filled 2023-12-29: qty 30

## 2023-12-29 MED ORDER — PROPOFOL 500 MG/50ML IV EMUL
INTRAVENOUS | Status: DC | PRN
  Administered 2023-12-29: 90 ug/kg/min via INTRAVENOUS
  Administered 2023-12-29: 55 ug/kg/min via INTRAVENOUS

## 2023-12-29 MED ORDER — 0.9 % SODIUM CHLORIDE (POUR BTL) OPTIME
TOPICAL | Status: DC | PRN
  Administered 2023-12-29: 500 mL

## 2023-12-29 MED ORDER — FENTANYL CITRATE (PF) 100 MCG/2ML IJ SOLN: 25.0000 ug | INTRAMUSCULAR | Status: DC | PRN

## 2023-12-29 MED ORDER — ACETAMINOPHEN 10 MG/ML IV SOLN: 1000.0000 mg | Freq: Once | INTRAVENOUS | Status: DC | PRN

## 2023-12-29 MED ORDER — HYDROMORPHONE HCL 1 MG/ML IJ SOLN
INTRAMUSCULAR | Status: AC
  Filled 2023-12-29: qty 1

## 2023-12-29 MED ORDER — MIDAZOLAM HCL 2 MG/2ML IJ SOLN
INTRAMUSCULAR | Status: DC | PRN
  Administered 2023-12-29 (×2): 1 mg via INTRAVENOUS

## 2023-12-29 MED ORDER — BUPIVACAINE HCL 0.5 % IJ SOLN
INTRAMUSCULAR | Status: DC | PRN
  Administered 2023-12-29: 10 mL

## 2023-12-29 MED ORDER — MAGNESIUM SULFATE 2 GM/50ML IV SOLN
2.0000 g | Freq: Once | INTRAVENOUS | Status: AC
  Administered 2023-12-29: 2 g via INTRAVENOUS
  Filled 2023-12-29: qty 50

## 2023-12-29 MED ORDER — OXYCODONE HCL 5 MG/5ML PO SOLN: 5.0000 mg | Freq: Once | ORAL | Status: DC | PRN

## 2023-12-29 SURGICAL SUPPLY — 52 items
BASIN GRAD PLASTIC 32OZ STRL (MISCELLANEOUS) IMPLANT
BEADS BIO ARTH CALC SULFAT 5CC (Bone Implant) IMPLANT
BLADE MED AGGRESSIVE (BLADE) ×1 IMPLANT
BLADE OSC/SAGITTAL MD 5.5X18 (BLADE) IMPLANT
BLADE SURG 15 STRL LF DISP TIS (BLADE) ×2 IMPLANT
BLADE SURG MINI STRL (BLADE) ×1 IMPLANT
BNDG ELASTIC 4INX 5YD STR LF (GAUZE/BANDAGES/DRESSINGS) ×1 IMPLANT
BNDG ESMARCH 4X12 STRL LF (GAUZE/BANDAGES/DRESSINGS) ×1 IMPLANT
BNDG GAUZE DERMACEA FLUFF 4 (GAUZE/BANDAGES/DRESSINGS) ×1 IMPLANT
CNTNR URN SCR LID CUP LEK RST (MISCELLANEOUS) IMPLANT
CUFF TOURN SGL QUICK 12 (TOURNIQUET CUFF) IMPLANT
CUFF TOURN SGL QUICK 18X4 (TOURNIQUET CUFF) IMPLANT
DRAPE FLUOR MINI C-ARM 54X84 (DRAPES) IMPLANT
DRSG EMULSION OIL 3X3 NADH (GAUZE/BANDAGES/DRESSINGS) IMPLANT
DRSG EMULSION OIL 3X8 NADH (GAUZE/BANDAGES/DRESSINGS) IMPLANT
DRSG TELFA 3X8 NADH STRL (GAUZE/BANDAGES/DRESSINGS) ×1 IMPLANT
DURAPREP 26ML APPLICATOR (WOUND CARE) ×1 IMPLANT
ELECTRODE REM PT RTRN 9FT ADLT (ELECTROSURGICAL) ×1 IMPLANT
GAUZE PAD ABD 8X10 STRL (GAUZE/BANDAGES/DRESSINGS) ×1 IMPLANT
GAUZE SPONGE 4X4 12PLY STRL (GAUZE/BANDAGES/DRESSINGS) ×2 IMPLANT
GAUZE STRETCH 2X75IN STRL (MISCELLANEOUS) IMPLANT
GAUZE XEROFORM 1X8 LF (GAUZE/BANDAGES/DRESSINGS) ×1 IMPLANT
GLOVE BIOGEL PI IND STRL 7.5 (GLOVE) ×1 IMPLANT
GLOVE SURG SYN 7.5 E (GLOVE) ×1 IMPLANT
GLOVE SURG SYN 7.5 PF PI (GLOVE) ×1 IMPLANT
GOWN STRL REUS W/ TWL LRG LVL3 (GOWN DISPOSABLE) IMPLANT
GOWN STRL REUS W/ TWL XL LVL3 (GOWN DISPOSABLE) ×1 IMPLANT
GRAFT SKIN WND MICRO 38 (Tissue) IMPLANT
HANDPIECE VERSAJET DEBRIDEMENT (MISCELLANEOUS) IMPLANT
KIT TURNOVER KIT A (KITS) ×1 IMPLANT
LABEL OR SOLS (LABEL) ×1 IMPLANT
MANIFOLD NEPTUNE II (INSTRUMENTS) ×1 IMPLANT
NDL FILTER BLUNT 18X1 1/2 (NEEDLE) ×1 IMPLANT
NDL HYPO 25X1 1.5 SAFETY (NEEDLE) ×2 IMPLANT
NEEDLE FILTER BLUNT 18X1 1/2 (NEEDLE) ×1 IMPLANT
NEEDLE HYPO 25X1 1.5 SAFETY (NEEDLE) ×2 IMPLANT
NS IRRIG 500ML POUR BTL (IV SOLUTION) ×1 IMPLANT
PACK EXTREMITY ARMC (MISCELLANEOUS) ×1 IMPLANT
PAD ABD DERMACEA PRESS 5X9 (GAUZE/BANDAGES/DRESSINGS) ×1 IMPLANT
PAD PREP OB/GYN DISP 24X41 (PERSONAL CARE ITEMS) ×1 IMPLANT
SOL .9 NS 3000ML IRR UROMATIC (IV SOLUTION) IMPLANT
SOLUTION PREP PVP 2OZ (MISCELLANEOUS) ×1 IMPLANT
STAPLER SKIN PROX 35W (STAPLE) ×1 IMPLANT
SUT MNCRL AB 3-0 PS2 27 (SUTURE) ×1 IMPLANT
SUT PROLENE 2 0 FS (SUTURE) IMPLANT
SUT PROLENE 3 0 PS 2 (SUTURE) ×1 IMPLANT
SWAB CULTURE AMIES ANAERIB BLU (MISCELLANEOUS) IMPLANT
SYR 10ML LL (SYRINGE) ×1 IMPLANT
TIP BRUSH PULSAVAC PLUS 24.33 (MISCELLANEOUS) IMPLANT
TIP FAN IRRIG PULSAVAC PLUS (DISPOSABLE) IMPLANT
TRAP FLUID SMOKE EVACUATOR (MISCELLANEOUS) ×1 IMPLANT
WATER STERILE IRR 500ML POUR (IV SOLUTION) ×1 IMPLANT

## 2023-12-29 NOTE — Transfer of Care (Signed)
 Immediate Anesthesia Transfer of Care Note  Patient: Derrick Blanchard  Procedure(s) Performed: AMPUTATION, FOOT, RAY (Left)  Patient Location: PACU  Anesthesia Type:MAC  Level of Consciousness: awake and patient cooperative  Airway & Oxygen Therapy: Patient Spontanous Breathing and Patient connected to nasal cannula oxygen  Post-op Assessment: Report given to RN and Post -op Vital signs reviewed and stable  Post vital signs: Reviewed and stable  Last Vitals:  Vitals Value Taken Time  BP 100/62 12/29/23 1311  Temp    Pulse 86 12/29/23 1315  Resp 15 12/29/23 1315  SpO2 99 % 12/29/23 1315  Vitals shown include unfiled device data.  Last Pain:  Vitals:   12/29/23 1146  TempSrc: Tympanic  PainSc: 4       Patients Stated Pain Goal: 0 (12/29/23 1146)  Complications: No notable events documented.

## 2023-12-29 NOTE — Op Note (Signed)
 Full Operative Report  Date of Operation: 1:17 PM, 12/29/2023   Patient: Derrick Blanchard - 43 y.o. male  Surgeon: Evertt Hoe, DPM   Assistant: None  Diagnosis: Osteomyelitis of left foot  Procedure:  1.  Partial first ray amputation, left foot 2.  Partial resection of second metatarsal, left foot 3.  Application dissolvable antibiotic beads, left foot 4.  Application dermal allograft 38 cm, left foot    Anesthesia: General  No responsible provider has been recorded for the case.  Anesthesiologist: Baltazar Bonier, MD CRNA: Philippe Brazen, CRNA   Estimated Blood Loss: 100 mL  Hemostasis: 1) Anatomical dissection, mechanical compression, electrocautery 2) no tourniquet was used during procedure  Implants: Implant Name Type Inv. Item Serial No. Manufacturer Lot No. LRB No. Used Action  BEADS BIO ARTH CALC SULFAT 5CC - [id number] Bone Implant BEADS BIO ARTH CALC SULFAT 5CC  [employer] [id number] Left 1 Implanted  GRAFT SKIN WND MICRO 38 - [id number] Tissue GRAFT SKIN WND MICRO 38  [employer] [id number] Left 1 Implanted    Materials: Prolene 2-0 and skin staples  Injectables: 1) Pre-operatively: 20 cc of 50:50 mixture 1%lidocaine  plain and 0.5% marcaine  plain 2) Post-operatively: None   Specimens: Pathology: Partial first ray amputation and separately proximal margin first metatarsal microbiology: Deep tissue culture left foot    Antibiotics: IV antibiotics given per schedule on the floor  Drains: None  Complications: Patient tolerated the procedure well without complication.   Operative findings: As below in detailed report  Indications for Procedure: Derrick Blanchard presents to Evertt Hoe, DPM with a chief complaint of open wound plantar aspect first MPJ with concern for osteomyelitis of the first ray and possibly second metatarsal.  The patient has failed conservative treatments of various modalities. At this time the patient  has elected to proceed with surgical correction. All alternatives, risks, and complications of the procedures were thoroughly explained to the patient. Patient exhibits appropriate understanding of all discussion points and informed consent was signed and obtained in the chart with no guarantees to surgical outcome given or implied.  Description of Procedure: Patient was brought to the operating room. Patient remained on their hospital bed in the supine position. A surgical timeout was performed and all members of the operating room, the procedure, and the surgical site were identified. anesthesia occurred as per anesthesia record. Local anesthetic as previously described was then injected about the operative field in a local infiltrative block.  The operative lower extremity as noted above was then prepped and draped in the usual sterile manner. The following procedure then began.  Attention was directed to the LEFT lower extremity. A racquet type incision was made over the dorsal medial aspect of the first metatarsal, including the entire digit. A full thickness incision was made down to bone using a #15 blade. The incision was continued through the soft tissue down to the shaft of the 1st metatarsal. A deep tissue culture was harvested underlying the first MPJ. Using a #15 blade, the digit was then disarticulated in its entirety at the metatarsophalangeal joint and freed of all soft tissue attachments. The specimen was passed off the field and sent for gross pathology. Next, a 15 blade was then used to free up the periosteum on the metatarsal shaft. Using a sagittal saw, the metatarsal was cut in a dorsal distal to plantar proximal orientation and beveled so that the medial cortex was shorter than the lateral.  The first metatarsal was sent  for proximal margin pathology. the bone was passed off the field and sent as a gross specimen.  All remaining non-viable and necrotic tissues were sharply resected and  removed.   Next decision was made to resect the distal aspect of the second metatarsal for closure purposes and due to concern for possible osteomyelitis in the distal aspect of the second metatarsal.  Sagittal saw was used to make a transverse osteotomy through the mid to base area of the second metatarsal.  Significant amount of fat and fibrotic tissues were resected from the amputation site. Extensor and flexors tendons were grasped with a hemostat and cut proximally. The surgical site was then flushed with 3000ml of saline with pulse lavage.    Next antibiotic beads - dissolvable calcium  sulfate beads - mixed with vancomycin  and tobramycin  were prepared on the back table. 5 cc of small beads were prepared. These beads were then mixed with 38 cm^2 of Kerecis micronized dermal allograft in order to add structure, fill dead space and promote healing of the surgical site. Small amount of sterile saline was then added to make a slurry. This was then implanted in the surgical site/cavity.The skin flap was then approximated and closed primarily with Prolene 2-0 and skin staples.   The surgical site was then dressed with Xeroform 4 x 4 gauze ABD pad Kerlix Ace. The patient tolerated both the procedure and anesthesia well with vital signs stable throughout. The patient was transferred in good condition and all vital signs stable  from the OR to recovery under the discretion of anesthesia.  Condition: Vital signs stable, neurovascular status unchanged from preoperative   Surgical plan:  Large amount of soft tissue and bone needed to be resected to achieve primary closure.  High risk for dehiscence at this area if patient walks on the foot at this time.  Strict nonweightbearing left lower extremity postop shoe for transfers only.  High risk for progression to dehiscence/need for transmetatarsal amputation at this point.  Follow-up cultures and pathology  The patient will be nonweightbearing in a postop shoe to  the operative limb until further instructed. The dressing is to remain clean, dry, and intact. Will continue to follow unless noted elsewhere.   Russ Course, DPM [facility]

## 2023-12-29 NOTE — Anesthesia Preprocedure Evaluation (Addendum)
 Anesthesia Evaluation  Patient identified by MRN, date of birth, ID band Patient awake    Reviewed: Allergy & Precautions, H&P , NPO status , Patient's Chart, lab work & pertinent test results  Airway Mallampati: III  TM Distance: >3 FB Neck ROM: full    Dental  (+) Chipped   Pulmonary Current Smoker   Pulmonary exam normal        Cardiovascular hypertension, (-) angina (-) DOE Normal cardiovascular exam+ Valvular Problems/Murmurs      Neuro/Psych  PSYCHIATRIC DISORDERS Anxiety Depression     Neuromuscular disease    GI/Hepatic ,GERD  ,,(+) Cirrhosis     substance abuse  alcohol use  Endo/Other  negative endocrine ROS    Renal/GU negative Renal ROS  negative genitourinary   Musculoskeletal   Abdominal  (+) + obese  Peds  Hematology  (+) Blood dyscrasia, anemia   Anesthesia Other Findings Left foot Osteomyelitis Pt with alcohol abuse and withdrawal on 4/23.   PER ED NOTE: Mr. Meyer states that approximately 2-3 days ago, he was walking his dog when he felt a pop sensation and noticed that the wound seemed to open up on the bottom of his left foot.  Since then, he has been having worsening redness, swelling, pain and drainage with a foul odor.  He endorses hot and cold chills, malaise but denies any nausea or vomiting.  He occasionally sees some blood in his stool.  Thrombocytopenia now s/p 2 units platelets 4/11  Past Medical History: No date: Anemia No date: Anxiety No date: Cirrhosis (HCC) No date: Depression No date: Heart murmur No date: Hepatitis No date: Hypertension No date: Substance abuse [facility])  Past Surgical History: No date: COLONOSCOPY 02/03/2015: ESOPHAGOGASTRODUODENOSCOPY (EGD) WITH PROPOFOL ; N/A     Comment:  Procedure: ESOPHAGOGASTRODUODENOSCOPY (EGD) WITH               PROPOFOL ;  Surgeon: Luella Sager, MD;  Location:               [facility] ENDOSCOPY;  Service: Endoscopy;  Laterality:  N/A; 12/21/2017: ESOPHAGOGASTRODUODENOSCOPY (EGD) WITH PROPOFOL ; N/A     Comment:  Procedure: ESOPHAGOGASTRODUODENOSCOPY (EGD) WITH               PROPOFOL ;  Surgeon: Luke Salaam, MD;  Location: [facility]               ENDOSCOPY;  Service: Gastroenterology;  Laterality: N/A; No date: FISSURECTOMY  BMI    Body Mass Index: 32.69 kg/m      Reproductive/Obstetrics negative OB ROS                              Anesthesia Physical Anesthesia Plan  ASA: 3  Anesthesia Plan: General   Post-op Pain Management: Regional block* and Ketamine IV*   Induction: Intravenous  PONV Risk Score and Plan: Propofol  infusion and TIVA  Airway Management Planned: Natural Airway  Additional Equipment:   Intra-op Plan:   Post-operative Plan:   Informed Consent: I have reviewed the patients History and Physical, chart, labs and discussed the procedure including the risks, benefits and alternatives for the proposed anesthesia with the patient or authorized representative who has indicated his/her understanding and acceptance.     Dental Advisory Given  Plan Discussed with: CRNA and Surgeon  Anesthesia Plan Comments:          Anesthesia Quick Evaluation

## 2023-12-29 NOTE — Anesthesia Postprocedure Evaluation (Signed)
 Anesthesia Post Note  Patient: Derrick Blanchard  Procedure(s) Performed: AMPUTATION, FOOT, RAY (Left)  Patient location during evaluation: PACU Anesthesia Type: General Level of consciousness: awake and alert Pain management: pain level controlled Vital Signs Assessment: post-procedure vital signs reviewed and stable Respiratory status: spontaneous breathing, nonlabored ventilation and respiratory function stable Cardiovascular status: blood pressure returned to baseline and stable Postop Assessment: no apparent nausea or vomiting Anesthetic complications: no   No notable events documented.   Last Vitals:  Vitals:   12/29/23 1330 12/29/23 1346  BP: 112/64 104/75  Pulse: 83 87  Resp: 16 15  Temp:  37.1 C  SpO2: 100% 100%    Last Pain:  Vitals:   12/29/23 1346  TempSrc:   PainSc: 0-No pain                 Baltazar Bonier

## 2023-12-29 NOTE — Plan of Care (Signed)
   Problem: Education: Goal: Knowledge of General Education information will improve Description: Including pain rating scale, medication(s)/side effects and non-pharmacologic comfort measures Outcome: Progressing   Problem: Activity: Goal: Risk for activity intolerance will decrease Outcome: Progressing   Problem: Pain Managment: Goal: General experience of comfort will improve and/or be controlled Outcome: Progressing

## 2023-12-29 NOTE — Plan of Care (Signed)

## 2023-12-29 NOTE — Progress Notes (Signed)
 History and Physical Interval Note:  12/29/2023 12:05 PM  Derrick Blanchard  has presented today for surgery, with the diagnosis of osteomyelitis of first ray.  The various methods of treatment have been discussed with the patient and family. After consideration of risks, benefits and other options for treatment, the patient has consented to   Procedure(s) with comments: AMPUTATION, FOOT, RAY (Left) - Partial first ray amp, abx beads, graft application as a surgical intervention.  The patient's history has been reviewed, patient examined, no change in status, stable for surgery.  I have reviewed the patient's chart and labs.  Questions were answered to the patient's satisfaction.     Karlene Overcast Laylaa Guevarra

## 2023-12-29 NOTE — Progress Notes (Signed)
 Progress Note    Derrick Blanchard  EAV:[id number] DOB: [birth year]  DOA: 12/27/2023 PCP: Pcp, No      Brief Narrative:    Medical records reviewed and are as summarized below:  Derrick Blanchard is a 43 y.o. 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  About 3 days prior to admission, he was walking his dog when he felt a pop sensation and noticed that the wound on his left foot had opened up.  He noticed redness, swelling, increasing pain and foul-smelling drainage from the wound on the foot and left big toe.  He also complained of shakiness and was concerned about alcohol withdrawal.      Assessment/Plan:   Principal Problem:   Infection of toe as complication of amputation (HCC) Active Problems:   Alcohol use disorder, severe, dependence (HCC)   Alcoholic cirrhosis of liver (HCC)   Pancytopenia (HCC)   Hematochezia   Body mass index is 33.45 kg/m.  (Obesity)   Infection of toe as complication of amputation (HCC) MRI concerning for osteomyelitis Continue empiric IV antibiotics (ceftriaxone , Flagyl  and Zyvox ). Plan for left partial first ray amputation today Recent I&D of left foot wound on 12/16/2023    Alcohol use disorder, severe, dependence (HCC) Patient has an extensive history of alcohol use disorder complicated by cirrhosis.  He notes that he used to drink half a gallon of liquor per day, but now with drinks several 40 ounces or up to 15 beers.  Continue Librium  taper. Use Ativan  as needed per CIWA protocol. Continue multivitamins and folic acid     Alcoholic cirrhosis of liver (HCC) Compensated    Pancytopenia (HCC) Cell lines are currently at baseline.  Monitor CBC    Hematochezia Patient endorses intermittent mild  red blood on his stool.  Possibly due to to hemorrhoids. H&H is stable. No indication for blood transfusion at this time.    Hypokalemia, hypomagnesemia Potassium has normalized Magnesium  is still low at 1.6.  Continue magnesium  repletion with IV magnesium  sulfate. Monitor electrolytes and replete as needed    Diet Order             Diet NPO time specified Except for: Sips with Meds  Diet effective midnight                            Consultants: Podiatrist  Procedures: None    Medications:    chlordiazePOXIDE   25 mg Oral BID   Followed by   Cecily Cohen ON 12/31/2023] chlordiazePOXIDE   25 mg Oral Daily   folic acid   1 mg Oral Daily   multivitamin with minerals  1 tablet Oral Daily   sodium chloride  flush  3 mL Intravenous Q12H   thiamine   100 mg Oral Daily   Or   thiamine   100 mg Intravenous Daily   Continuous Infusions:  cefTRIAXone  (ROCEPHIN )  IV Stopped (12/28/23 1749)   linezolid  (ZYVOX ) IV 600 mg (12/29/23 0833)   magnesium  sulfate bolus IVPB     metronidazole  500 mg (12/29/23 0838)     Anti-infectives (From admission, onward)    Start     Dose/Rate Route Frequency Ordered Stop   12/27/23 2200  linezolid  (ZYVOX ) IVPB 600 mg        600 mg  300 mL/hr over 60 Minutes Intravenous Every 12 hours 12/27/23 1319     12/27/23 2200  metroNIDAZOLE  (FLAGYL ) IVPB 500 mg        500 mg 100 mL/hr over 60 Minutes Intravenous Every 12 hours 12/27/23 1321     12/27/23 1800  cefTRIAXone  (ROCEPHIN ) 2 g in sodium chloride  0.9 % 100 mL IVPB        2 g 200 mL/hr over 30 Minutes Intravenous Every 24 hours 12/27/23 1321     12/27/23 1200  Ampicillin -Sulbactam (UNASYN ) 3 g in sodium chloride  0.9 % 100 mL IVPB       Placed in "And" Linked Group   3 g 200 mL/hr over 30 Minutes Intravenous  Once 12/27/23 1152 12/27/23 1300   12/27/23 1200  linezolid  (ZYVOX ) IVPB 600 mg       Placed in "And" Linked Group   600 mg 300 mL/hr over 60 Minutes Intravenous  Once 12/27/23 1152  12/27/23 1328              Family Communication/Anticipated D/C date and plan/Code Status   DVT prophylaxis: SCDs Start: 12/27/23 1307     Code Status: Full Code  Family Communication: None Disposition Plan: Plan to discharge home   Status is: Inpatient Remains inpatient appropriate because: Left foot infection       Subjective:   Interval events noted.  Pain in left foot is better because he got "pain medicine" about 45 minutes prior to my visit  Objective:    Vitals:   12/28/23 2020 12/29/23 0052 12/29/23 0409 12/29/23 0812  BP: 123/66 117/66 114/68 111/65  Pulse: 90 84 88 84  Resp: 19 19 20 16   Temp: 99.4 F (37.4 C) 98.4 F (36.9 C) 98.6 F (37 C) 98.3 F (36.8 C)  TempSrc:   Oral   SpO2: 98% 98% 97% 94%  Weight:      Height:       No data found.   Intake/Output Summary (Last 24 hours) at 12/29/2023 0903 Last data filed at 12/29/2023 0525 Gross per 24 hour  Intake 100 ml  Output 3870 ml  Net -3770 ml   Filed Weights   12/27/23 1110  Weight: 99.8 kg    Exam:  GEN: NAD SKIN: Warm and dry EYES: No pallor or icterus ENT: MMM CV: RRR PULM: CTA B ABD: soft, ND, NT, +BS CNS: AAO x 3, non focal EXT: Dressing on left foot wound is clean, dry and intact       Data Reviewed:   I have personally reviewed following labs and imaging studies:  Labs: Labs show the following:   Basic Metabolic Panel: Recent Labs  Lab 12/27/23 1114 12/28/23 0615 12/28/23 0619 12/29/23 0614  NA 141  --  137 135  K 3.8  --  3.4* 3.6  CL 111  --  110 108  CO2 25  --  22 23  GLUCOSE 118*  --  98 106*  BUN 8  --  9 8  CREATININE 0.65  --  0.59* 0.60*  CALCIUM  8.8*  --  8.0* 8.1*  MG 1.7 1.5*  --  1.6*  PHOS  --  3.3  --  3.3   GFR Estimated Creatinine Clearance: 140.6 mL/min (A) (by C-G formula based on SCr of 0.6 mg/dL (L)). Liver Function Tests: Recent Labs  Lab 12/27/23 1114 12/28/23 0619  AST 60* 42*  ALT 26 21  ALKPHOS 98 81   BILITOT 2.0* 1.8*  PROT 6.4* 5.5*  ALBUMIN 3.1* 2.5*   No results for input(s): "LIPASE", "AMYLASE" in the last 168 hours. No results for input(s): "AMMONIA" in the last 168 hours. Coagulation profile No results for input(s): "INR", "PROTIME" in the last 168 hours.  CBC: Recent Labs  Lab 12/27/23 1114 12/28/23 0619 12/29/23 0614  WBC 2.7* 1.3* 1.5*  NEUTROABS 1.9  --  0.9*  HGB 9.3* 8.2* 8.3*  HCT 29.4* 25.9* 26.2*  MCV 81.9 81.2 82.1  PLT 46* 37* 44*   Cardiac Enzymes: No results for input(s): "CKTOTAL", "CKMB", "CKMBINDEX", "TROPONINI" in the last 168 hours. BNP (last 3 results) No results for input(s): "PROBNP" in the last 8760 hours. CBG: No results for input(s): "GLUCAP" in the last 168 hours. D-Dimer: No results for input(s): "DDIMER" in the last 72 hours. Hgb A1c: No results for input(s): "HGBA1C" in the last 72 hours. Lipid Profile: No results for input(s): "CHOL", "HDL", "LDLCALC", "TRIG", "CHOLHDL", "LDLDIRECT" in the last 72 hours. Thyroid function studies: No results for input(s): "TSH", "T4TOTAL", "T3FREE", "THYROIDAB" in the last 72 hours.  Invalid input(s): "FREET3" Anemia work up: No results for input(s): "VITAMINB12", "FOLATE", "FERRITIN", "TIBC", "IRON", "RETICCTPCT" in the last 72 hours. Sepsis Labs: Recent Labs  Lab 12/27/23 1114 12/27/23 1424 12/28/23 0619 12/29/23 0614  WBC 2.7*  --  1.3* 1.5*  LATICACIDVEN 1.4 1.9  --   --     Microbiology No results found for this or any previous visit (from the past 240 hours).  Procedures and diagnostic studies:  MR FOOT LEFT W WO CONTRAST Result Date: 12/27/2023 CLINICAL DATA:  Status post incision and drainage on 12/16/2023 with increased pain and swelling. EXAM: MRI OF THE LEFT FOREFOOT WITHOUT AND WITH CONTRAST TECHNIQUE: Multiplanar, multisequence MR imaging of the left forefoot was performed both before and after administration of intravenous contrast. CONTRAST:  10mL GADAVIST  GADOBUTROL  1  MMOL/ML IV SOLN COMPARISON:  Left foot radiographs dated 12/27/2023. MRI of the left foot dated 12/13/2023. FINDINGS: Bones/Joint/Cartilage Status post interval incision and drainage with new soft tissue defect at the medial plantar forefoot, overlying the proximal to mid great toe. This soft tissue defect/wound measures up to 4.2 cm AP x 4.1 cm TR x 1.8 cm CC and extends deep to the level of the plantar margin of the flexor hallucis longus tendon at the level of the first MTP joint as well as the plantar surface of the lateral hallux sesamoid which demonstrates marrow signal abnormality with corresponding enhancement, increased since the prior exam, concerning for osteomyelitis. There is marrow signal abnormality with associated enhancement of the first proximal phalanx and the distal 2/3 of the first metatarsal. Focal region of T2 hyperintense marrow signal abnormality within the lateral aspect of the first metatarsal head with peripheral enhancement (series 12, image 15 and series 5, image 23). These findings could relate to interval postsurgical change, however, osteomyelitis can not be excluded. The soft tissue defect/wound appears to extend deep to the medial plantar margin of the second metatarsal head which demonstrates marrow signal abnormality with mild enhancement, which could reflect interval postsurgical change, however, osteomyelitis can not be excluded. Prior amputation of the second digit the level of second MTP joint. Mild degenerative changes of the first MTP joint. No significant joint effusion. Ligaments Lisfranc ligament is intact. Muscles and Tendons Flexor and extensor compartments are intact. Trace tenosynovitis of the flexor hallucis longus tendon. Diffuse atrophy of the intrinsic musculature of the foot, likely secondary to chronic denervation changes. Soft tissue Status post interval incision and  drainage with large soft tissue defect at the medial plantar forefoot, as described above,  with surrounding edema and enhancement. No loculated fluid collection. IMPRESSION: 1. Status post interval incision and drainage with new large soft tissue defect at the medial plantar forefoot, overlying the proximal to mid great toe, measuring up to 4.2 x 4.1 x 1.8 cm. This collection extends deep to the plantar surface of the lateral hallux sesamoid, which demonstrates marrow signal abnormality with corresponding enhancement, increased since the prior exam, concerning for osteomyelitis. 2. Marrow signal abnormality with associated enhancement of the first proximal phalanx and the distal 2/3 of the first metatarsal. These findings could relate to interval postsurgical change, however, osteomyelitis can not be excluded. 3. The soft tissue defect/wound appears to extend deep to the medial plantar margin of the second metatarsal head which demonstrates marrow signal abnormality with mild enhancement, which could reflect interval postsurgical change, however, osteomyelitis can not be excluded. 4. Prior amputation of second ray at the level of the second MTP joint. Electronically Signed   By: Mannie Seek M.D.   On: 12/27/2023 17:23   DG Foot Complete Left Result Date: 12/27/2023 CLINICAL DATA:  Infection status post second toe resection EXAM: LEFT FOOT - COMPLETE 3+ VIEW COMPARISON:  December 13, 2023 FINDINGS: There is no evidence of fracture or dislocation. There is no evidence of arthropathy or other focal bone abnormality. Soft tissues are unremarkable. IMPRESSION: CORTICAL IRREGULARITY IS NOTED WITHIN THE MEDIAL CORTEX OF THE PROXIMAL PHALANX OF THE FIRST TOE.  COULD CORRELATE WITH RECENT TRAUMA AND SMALL CORTICAL FRACTURE.: IMPRESSION: CORTICAL IRREGULARITY IS NOTED WITHIN THE MEDIAL CORTEX OF THE PROXIMAL PHALANX OF THE FIRST TOE. COULD CORRELATE WITH RECENT TRAUMA AND SMALL CORTICAL FRACTURE. Post prior second toe amputation.  No evidence of osteomyelitis Electronically Signed   By: Fredrich Jefferson M.D.   On:  12/27/2023 11:51               LOS: 2 days   Ramie Erman  [facility]   Pager on [email or web address]. If 7PM-7AM, please contact night-coverage at [email or web address]     12/29/2023, 9:03 AM

## 2023-12-29 NOTE — Progress Notes (Signed)
 Introduced patient to role of Statistician. Intake questions completed.   Patient scheduled for surgery today for a partial first ray amputation of left foot.   Patient does NOT currently have a PCP, but is agreeable to establishment of care. Would like PCP in [place] or [place] area.   Patient does NOT drive. Has difficulty with transportation. Medicaid non-emergent transport number added to AVS.   Patient is currently staying with someone, but states he will not be able to live there more than a "week or so." Patient states he "bounces around a lot." He is originally from [place], but had been living in [place] for quite some time prior to coming back.  Patient does have an incarceration history, with his last release being in 2015. Provided with information on [employer].   Patient states he does NOT have enough food to get him through the month. He has tried to apply for food stamps in the past, but has been denied because of his felony record. Encouraged to reach out to Lincoln Renshaw, case manager for [employer] to help navigate this.  Patient receives around $950-960/mo in SSI. Assisted with application for [employer]. Patient aware this is NOT an immediate fix for his housing situation.   Patient endorses both alcohol and THC use. He states he has "quit" in the past, but starts using again because "he likes it." Denies need for substance abuse treatment or resources at this time.

## 2023-12-29 NOTE — Progress Notes (Signed)
 On reassessment of foot, this nurse noticed bleeding coming from the dressing. There was blood also on the blankets. This nurse called the on call MD Dr. Alvah Auerbach, updated her of what was happening and was instructed to reinforce the dressing with abds and kerlex. The bleeding was in the area of the great toe and around the arch /heel of the foot. This nurse will update on coming nurse of situation.

## 2023-12-30 DIAGNOSIS — S91302D Unspecified open wound, left foot, subsequent encounter: Secondary | ICD-10-CM

## 2023-12-30 DIAGNOSIS — T8740 Infection of amputation stump, unspecified extremity: Secondary | ICD-10-CM | POA: Diagnosis not present

## 2023-12-30 LAB — BASIC METABOLIC PANEL WITH GFR
Anion gap: 4 — ABNORMAL LOW (ref 5–15)
BUN: 6 mg/dL (ref 6–20)
CO2: 21 mmol/L — ABNORMAL LOW (ref 22–32)
Calcium: 7.8 mg/dL — ABNORMAL LOW (ref 8.9–10.3)
Chloride: 107 mmol/L (ref 98–111)
Creatinine, Ser: 0.58 mg/dL — ABNORMAL LOW (ref 0.61–1.24)
GFR, Estimated: >60 mL/min (ref >60)
Glucose, Bld: 110 mg/dL — ABNORMAL HIGH (ref 70–99)
Potassium: 3.3 mmol/L — ABNORMAL LOW (ref 3.5–5.1)
Sodium: 132 mmol/L — ABNORMAL LOW (ref 135–145)

## 2023-12-30 LAB — CBC WITH DIFFERENTIAL/PLATELET
Abs Immature Granulocytes: 0.01 10*3/uL (ref 0.00–0.07)
Basophils Absolute: 0 10*3/uL (ref 0.0–0.1)
Basophils Relative: 0 %
Eosinophils Absolute: 0 10*3/uL (ref 0.0–0.5)
Eosinophils Relative: 1 %
HCT: 25.9 % — ABNORMAL LOW (ref 39.0–52.0)
Hemoglobin: 8.4 g/dL — ABNORMAL LOW (ref 13.0–17.0)
Immature Granulocytes: 0 %
Lymphocytes Relative: 10 %
Lymphs Abs: 0.4 10*3/uL — ABNORMAL LOW (ref 0.7–4.0)
MCH: 26.3 pg (ref 26.0–34.0)
MCHC: 32.4 g/dL (ref 30.0–36.0)
MCV: 81.2 fL (ref 80.0–100.0)
Monocytes Absolute: 0.5 10*3/uL (ref 0.1–1.0)
Monocytes Relative: 13 %
Neutro Abs: 3.1 10*3/uL (ref 1.7–7.7)
Neutrophils Relative %: 76 %
Platelets: 49 10*3/uL — ABNORMAL LOW (ref 150–400)
RBC: 3.19 MIL/uL — ABNORMAL LOW (ref 4.22–5.81)
RDW: 18.2 % — ABNORMAL HIGH (ref 11.5–15.5)
WBC: 4.1 10*3/uL (ref 4.0–10.5)
nRBC: 0 % (ref 0.0–0.2)

## 2023-12-30 LAB — GLUCOSE, CAPILLARY
Glucose-Capillary: 112 mg/dL — ABNORMAL HIGH (ref 70–99)
Glucose-Capillary: 117 mg/dL — ABNORMAL HIGH (ref 70–99)

## 2023-12-30 LAB — MAGNESIUM: Magnesium: 1.4 mg/dL — ABNORMAL LOW (ref 1.7–2.4)

## 2023-12-30 LAB — PHOSPHORUS: Phosphorus: 2.7 mg/dL (ref 2.5–4.6)

## 2023-12-30 MED ORDER — POTASSIUM CHLORIDE CRYS ER 20 MEQ PO TBCR
40.0000 meq | EXTENDED_RELEASE_TABLET | Freq: Once | ORAL | Status: AC
  Administered 2023-12-30: 40 meq via ORAL
  Filled 2023-12-30: qty 2

## 2023-12-30 MED ORDER — MAGNESIUM SULFATE 4 GM/100ML IV SOLN
4.0000 g | Freq: Once | INTRAVENOUS | Status: AC
  Administered 2023-12-30: 4 g via INTRAVENOUS
  Filled 2023-12-30: qty 100

## 2023-12-30 MED ORDER — LORAZEPAM 1 MG PO TABS
1.0000 mg | ORAL_TABLET | ORAL | Status: DC | PRN
  Administered 2024-01-01 – 2024-01-02 (×2): 2 mg via ORAL
  Filled 2023-12-30 (×2): qty 2

## 2023-12-30 MED ORDER — LORAZEPAM 2 MG/ML IJ SOLN
1.0000 mg | INTRAMUSCULAR | Status: DC | PRN
  Administered 2024-01-01 (×2): 2 mg via INTRAVENOUS
  Administered 2024-01-01: 1 mg via INTRAVENOUS
  Filled 2023-12-30 (×3): qty 1

## 2023-12-30 NOTE — Progress Notes (Signed)
 Subjective:  Patient ID: Derrick Blanchard, male    DOB: 1981-08-18,  MRN: [id number]  Patient s/p left partial ray amputation and partial second metatarsal resection with application of allograft and antibiotic beads. Realtes he is getting pain. Denies n/v/c. Has had a fever this morning.   Past Medical History:  Diagnosis Date   Anemia    Anxiety    Cirrhosis (HCC)    Depression    Heart murmur    Hepatitis    Hypertension    Substance abuse (HCC)      Past Surgical History:  Procedure Laterality Date   COLONOSCOPY     ESOPHAGOGASTRODUODENOSCOPY (EGD) WITH PROPOFOL  N/A 02/03/2015   Procedure: ESOPHAGOGASTRODUODENOSCOPY (EGD) WITH PROPOFOL ;  Surgeon: Luella Sager, MD;  Location: [facility] ENDOSCOPY;  Service: Endoscopy;  Laterality: N/A;   ESOPHAGOGASTRODUODENOSCOPY (EGD) WITH PROPOFOL  N/A 12/21/2017   Procedure: ESOPHAGOGASTRODUODENOSCOPY (EGD) WITH PROPOFOL ;  Surgeon: Luke Salaam, MD;  Location: [facility] ENDOSCOPY;  Service: Gastroenterology;  Laterality: N/A;   FISSURECTOMY     IRRIGATION AND DEBRIDEMENT FOOT Left 12/16/2023   Procedure: IRRIGATION AND DEBRIDEMENT FOOT, possible sesamoidectomy, bone biopsy;  Surgeon: Reina Cara, DPM;  Location: [facility] ORS;  Service: Podiatry;  Laterality: Left;  Pulse lavage, bone biopsy with jam shidi needle, mini c arm, sagittal saw available       Latest Ref Rng & Units 12/30/2023    4:59 AM 12/29/2023    6:14 AM 12/28/2023    6:19 AM  CBC  WBC 4.0 - 10.5 K/uL 4.1  1.5  1.3   Hemoglobin 13.0 - 17.0 g/dL 8.4  8.3  8.2   Hematocrit 39.0 - 52.0 % 25.9  26.2  25.9   Platelets 150 - 400 K/uL 49  44  37        Latest Ref Rng & Units 12/30/2023    4:59 AM 12/29/2023    6:14 AM 12/28/2023    6:19 AM  BMP  Glucose 70 - 99 mg/dL 409  811  98   BUN 6 - 20 mg/dL 6  8  9    Creatinine 0.61 - 1.24 mg/dL 9.14  7.82  9.56   Sodium 135 - 145 mmol/L 132  135  137   Potassium 3.5 - 5.1 mmol/L 3.3  3.6  3.4   Chloride 98 - 111 mmol/L 107  108  110    CO2 22 - 32 mmol/L 21  23  22    Calcium  8.9 - 10.3 mg/dL 7.8  8.1  8.0      Objective:   Vitals:   12/30/23 0850 12/30/23 1211  BP: (!) 121/51 117/66  Pulse: (!) 102 (!) 101  Resp: 16   Temp: 99.6 F (37.6 C) 99.3 F (37.4 C)  SpO2: 100% 97%    General:AA&O x 3. Normal mood and affect   Vascular: DP and PT pulses 2/4 bilateral. Brisk capillary refill to all digits. Pedal hair present   Neruological. Epicritic sensation grossly intact.   Derm: Dressing clean dry and intact  MSK: MMT 5/5 in dorsiflexion, plantar flexion, inversion and eversion. Normal joint ROM without pain or crepitus.       Assessment & Plan:  Patient was evaluated and treated and all questions answered.  DX: s/p left partial firstt ray and partial resection second metatarsal on left foot  Patient to keep dressing clean dry and intact and is to be strictly NWB He may have Post-op shoe for transfer only.  Patient is at risk for dehiscence and  need for TMA.  Will follow-cultures. Pending.  Continue IV antibiotics and pending blood culture.  Will continue to follow.   Jennefer Moats, DPM  Accessible via secure chat for questions or concerns.

## 2023-12-30 NOTE — Progress Notes (Signed)
 RN Notified [facility]

## 2023-12-30 NOTE — Plan of Care (Signed)

## 2023-12-30 NOTE — Consult Note (Signed)
 PHARMACY CONSULT NOTE - FOLLOW UP  Pharmacy Consult for Electrolyte Monitoring and Replacement   Recent Labs: Potassium (mmol/L)  Date Value  12/30/2023 3.3 (L)  02/12/2013 4.7   Magnesium  (mg/dL)  Date Value  16/06/9603 1.4 (L)  02/11/2013 2.0   Calcium  (mg/dL)  Date Value  54/05/8118 7.8 (L)   Calcium , Total (mg/dL)  Date Value  14/78/2956 8.3 (L)   Albumin (g/dL)  Date Value  21/30/8657 2.5 (L)  11/24/2017 2.9 (L)  02/11/2013 2.3 (L)   Phosphorus (mg/dL)  Date Value  84/69/6295 2.7   Sodium (mmol/L)  Date Value  12/30/2023 132 (L)  11/24/2017 144  02/12/2013 138   Assessment: Derrick Blanchard is a 43 yo male who presented to the ED due to an open wound on their L foot. They have a history of recurrent hypomagnesemia. Patient also has extensive alcohol use disorder. They drink about half gallon of liquor per day. Pharmacy has been consulted to monitor this patient's electrolytes.   Goal of Therapy:  Electrolytes WNL  Plan:  K = 3.3: Potassium replaced by MD, 40 mEq KCl Mg = 1.4: Magnesium  replaced by MD, 4g Magnesium  Sulfate Phos = 2.7, no replacement indicated at this time Continue to monitor electrolytes daily   Thank you for letting pharmacy participate in this patient's care.  Craven Do, PharmD Pharmacy Resident  12/30/2023 1:36 PM

## 2023-12-30 NOTE — Progress Notes (Addendum)
 Progress Note    Derrick Blanchard  RJJ:[id number] DOB: [birth year]  DOA: 12/27/2023 PCP: Pcp, No      Brief Narrative:    Medical records reviewed and are as summarized below:  Derrick Blanchard is a 43 y.o. 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  About 3 days prior to admission, he was walking his dog when he felt a pop sensation and noticed that the wound on his left foot had opened up.  He noticed redness, swelling, increasing pain and foul-smelling drainage from the wound on the foot and left big toe.  He also complained of shakiness and was concerned about alcohol withdrawal.      Assessment/Plan:   Principal Problem:   Infection of toe as complication of amputation (HCC) Active Problems:   Alcohol use disorder, severe, dependence (HCC)   Alcoholic cirrhosis of liver (HCC)   Pancytopenia (HCC)   Hematochezia   Body mass index is 33.19 kg/m.  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 Follow-up wound cultures Strict nonweightbearing left lower extremity, postop shoe for transfers only per podiatrist.    Recent I&D of left foot wound on 12/16/2023  Patient had fever earlier this morning (Tmax 101.3 F).  Blood cultures have been ordered.  Continue IV antibiotics for now.  Continue IV ceftriaxone , Flagyl  and Zyvox .   Alcohol use disorder, severe, dependence (HCC) Patient has an extensive history of alcohol use disorder complicated by cirrhosis.  He notes that he used to drink  half a gallon of liquor per day, but now with drinks several 40 ounces or up to 15 beers.  Continue Librium  taper. Use Ativan  as needed per CIWA protocol. Continue multivitamins and folic acid     Alcoholic cirrhosis of liver (HCC) Compensated    Pancytopenia (HCC) Cell lines are currently at baseline.  Monitor CBC    Hematochezia Patient endorses intermittent mild red blood on his stool.  Possibly due to to hemorrhoids. H&H is stable.  Hemoglobin is 8.4. No indication for blood transfusion at this time.    Hypokalemia, hypomagnesemia Potassium down to 3.3.  Replete potassium. Magnesium  down from 1.6-1.4.  Replete magnesium  with 4 g of IV magnesium  sulfate because of recurrent hypomagnesemia Monitor electrolytes and replete as needed Consult pharmacy for electrolyte monitoring and management   Diet Order             Diet regular Room service appropriate? Yes; Fluid consistency: Thin  Diet effective now                            Consultants: Podiatrist  Procedures: None    Medications:    chlordiazePOXIDE   25 mg Oral BID   Followed by   Cecily Cohen ON 12/31/2023] chlordiazePOXIDE   25 mg Oral Daily   folic acid   1 mg Oral Daily   multivitamin with minerals  1 tablet Oral Daily   polyethylene glycol  17 g Oral Daily   senna-docusate  1 tablet Oral QHS   sodium chloride  flush  3 mL Intravenous Q12H  thiamine   100 mg Oral Daily   Or   thiamine   100 mg Intravenous Daily   Continuous Infusions:  cefTRIAXone  (ROCEPHIN )  IV 2 g (12/29/23 1720)   linezolid  (ZYVOX ) IV 600 mg (12/30/23 1040)   metronidazole  500 mg (12/30/23 1142)     Anti-infectives (From admission, onward)    Start     Dose/Rate Route Frequency Ordered Stop   12/29/23 1253  vancomycin  (VANCOCIN ) powder  Status:  Discontinued          As needed 12/29/23 1309 12/29/23 1311   12/29/23 1253  tobramycin  (NEBCIN ) injection  Status:  Discontinued          As needed 12/29/23 1317 12/29/23  1317   12/27/23 2200  linezolid  (ZYVOX ) IVPB 600 mg        600 mg 300 mL/hr over 60 Minutes Intravenous Every 12 hours 12/27/23 1319     12/27/23 2200  metroNIDAZOLE  (FLAGYL ) IVPB 500 mg        500 mg 100 mL/hr over 60 Minutes Intravenous Every 12 hours 12/27/23 1321     12/27/23 1800  cefTRIAXone  (ROCEPHIN ) 2 g in sodium chloride  0.9 % 100 mL IVPB        2 g 200 mL/hr over 30 Minutes Intravenous Every 24 hours 12/27/23 1321     12/27/23 1200  Ampicillin -Sulbactam (UNASYN ) 3 g in sodium chloride  0.9 % 100 mL IVPB       Placed in "And" Linked Group   3 g 200 mL/hr over 30 Minutes Intravenous  Once 12/27/23 1152 12/27/23 1300   12/27/23 1200  linezolid  (ZYVOX ) IVPB 600 mg       Placed in "And" Linked Group   600 mg 300 mL/hr over 60 Minutes Intravenous  Once 12/27/23 1152 12/27/23 1328              Family Communication/Anticipated D/C date and plan/Code Status   DVT prophylaxis: SCDs Start: 12/27/23 1307     Code Status: Full Code  Family Communication: None Disposition Plan: Plan to discharge home   Status is: Inpatient Remains inpatient appropriate because: Left foot infection       Subjective:   Interval events noted.  He had fever with Tmax of 101.3 F early this morning, around 3 AM.  He had some bleeding from left foot wound after surgery yesterday but this has resolved.  Objective:    Vitals:   12/30/23 0313 12/30/23 0322 12/30/23 0850 12/30/23 1211  BP: 129/61 129/61 (!) 121/51 117/66  Pulse: 100 100 (!) 102 (!) 101  Resp: 18  16   Temp: (!) 101.3 F (38.5 C)  99.6 F (37.6 C) 99.3 F (37.4 C)  TempSrc: Axillary   Oral  SpO2: 94%  100% 97%  Weight:      Height:       No data found.   Intake/Output Summary (Last 24 hours) at 12/30/2023 1323 Last data filed at 12/30/2023 1058 Gross per 24 hour  Intake --  Output 2600 ml  Net -2600 ml   Filed Weights   12/27/23 1110 12/29/23 1146  Weight: 99.8 kg 99 kg    Exam:  GEN: NAD SKIN:  Warm and dry EYES: No pallor or icterus ENT: MMM CV: RRR, tachycardic PULM: CTA B ABD: soft, ND, NT, +BS CNS: AAO x 3, non focal EXT: Dressing on left wound is clean, dry and intact      Data Reviewed:   I have personally reviewed following labs and imaging studies:  Labs: Labs show the following:   Basic Metabolic Panel: Recent Labs  Lab 12/27/23 1114 12/28/23 0615 12/28/23 0619 12/29/23 0614 12/30/23 0459  NA 141  --  137 135 132*  K 3.8  --  3.4* 3.6 3.3*  CL 111  --  110 108 107  CO2 25  --  22 23 21*  GLUCOSE 118*  --  98 106* 110*  BUN 8  --  9 8 6   CREATININE 0.65  --  0.59* 0.60* 0.58*  CALCIUM  8.8*  --  8.0* 8.1* 7.8*  MG 1.7 1.5*  --  1.6* 1.4*  PHOS  --  3.3  --  3.3 2.7   GFR Estimated Creatinine Clearance: 139.9 mL/min (A) (by C-G formula based on SCr of 0.58 mg/dL (L)). Liver Function Tests: Recent Labs  Lab 12/27/23 1114 12/28/23 0619  AST 60* 42*  ALT 26 21  ALKPHOS 98 81  BILITOT 2.0* 1.8*  PROT 6.4* 5.5*  ALBUMIN 3.1* 2.5*   No results for input(s): "LIPASE", "AMYLASE" in the last 168 hours. No results for input(s): "AMMONIA" in the last 168 hours. Coagulation profile No results for input(s): "INR", "PROTIME" in the last 168 hours.  CBC: Recent Labs  Lab 12/27/23 1114 12/28/23 0619 12/29/23 0614 12/30/23 0459  WBC 2.7* 1.3* 1.5* 4.1  NEUTROABS 1.9  --  0.9* 3.1  HGB 9.3* 8.2* 8.3* 8.4*  HCT 29.4* 25.9* 26.2* 25.9*  MCV 81.9 81.2 82.1 81.2  PLT 46* 37* 44* 49*   Cardiac Enzymes: No results for input(s): "CKTOTAL", "CKMB", "CKMBINDEX", "TROPONINI" in the last 168 hours. BNP (last 3 results) No results for input(s): "PROBNP" in the last 8760 hours. CBG: Recent Labs  Lab 12/30/23 0846 12/30/23 1208  GLUCAP 112* 117*   D-Dimer: No results for input(s): "DDIMER" in the last 72 hours. Hgb A1c: No results for input(s): "HGBA1C" in the last 72 hours. Lipid Profile: No results for input(s): "CHOL", "HDL", "LDLCALC",  "TRIG", "CHOLHDL", "LDLDIRECT" in the last 72 hours. Thyroid function studies: No results for input(s): "TSH", "T4TOTAL", "T3FREE", "THYROIDAB" in the last 72 hours.  Invalid input(s): "FREET3" Anemia work up: No results for input(s): "VITAMINB12", "FOLATE", "FERRITIN", "TIBC", "IRON", "RETICCTPCT" in the last 72 hours. Sepsis Labs: Recent Labs  Lab 12/27/23 1114 12/27/23 1424 12/28/23 0619 12/29/23 0614 12/30/23 0459  WBC 2.7*  --  1.3* 1.5* 4.1  LATICACIDVEN 1.4 1.9  --   --   --     Microbiology Recent Results (from the past 240 hours)  Aerobic/Anaerobic Culture w Gram Stain (surgical/deep wound)     Status: None (Preliminary result)   Collection Time: 12/29/23 12:41 PM   Specimen: Wound; Tissue  Result Value Ref Range Status   Specimen Description   Final    TISSUE BONE LEFT FOOT Performed at [facility], [address], [place], [place] [address]    Special Requests   Final    NONE Performed at [facility], [address] [place], [place] [address]    Gram Stain   Final    NO WBC SEEN RARE GRAM POSITIVE COCCI IN PAIRS IN SINGLES    Culture   Final    CULTURE REINCUBATED FOR BETTER GROWTH Performed at [facility], [address] [place], [place] [address]    Report Status PENDING  Incomplete    Procedures and diagnostic studies:  DG Foot 2 Views Left Result Date: 12/29/2023 CLINICAL DATA:  Postoperative. EXAM: LEFT FOOT - 2  VIEW COMPARISON:  Left foot x-ray 12/27/2023 FINDINGS: There is been amputation of the mid and proximal second and first metatarsals as well as first phalanx. Antibiotic seeds are seen overlying the surgical site. There is soft tissue swelling, soft tissue air and skin staples compatible with recent surgery. There is no acute fracture or dislocation. There is dorsal foot soft tissue swelling. IMPRESSION: Status post amputation of the mid and proximal second and first metatarsals as well as first phalanx. Electronically  Signed   By: Tyron Gallon M.D.   On: 12/29/2023 17:35               LOS: 3 days   Shailyn Weyandt  [facility]   Pager on [email or web address]. If 7PM-7AM, please contact night-coverage at [email or web address]     12/30/2023, 1:23 PM

## 2023-12-31 DIAGNOSIS — T8740 Infection of amputation stump, unspecified extremity: Secondary | ICD-10-CM | POA: Diagnosis not present

## 2023-12-31 DIAGNOSIS — S91302D Unspecified open wound, left foot, subsequent encounter: Secondary | ICD-10-CM | POA: Diagnosis not present

## 2023-12-31 LAB — BASIC METABOLIC PANEL WITH GFR
Anion gap: 5 (ref 5–15)
BUN: 6 mg/dL (ref 6–20)
CO2: 24 mmol/L (ref 22–32)
Calcium: 7.8 mg/dL — ABNORMAL LOW (ref 8.9–10.3)
Chloride: 103 mmol/L (ref 98–111)
Creatinine, Ser: 0.64 mg/dL (ref 0.61–1.24)
GFR, Estimated: >60 mL/min (ref >60)
Glucose, Bld: 117 mg/dL — ABNORMAL HIGH (ref 70–99)
Potassium: 3.5 mmol/L (ref 3.5–5.1)
Sodium: 132 mmol/L — ABNORMAL LOW (ref 135–145)

## 2023-12-31 LAB — PHOSPHORUS: Phosphorus: 2.3 mg/dL — ABNORMAL LOW (ref 2.5–4.6)

## 2023-12-31 LAB — MAGNESIUM: Magnesium: 1.6 mg/dL — ABNORMAL LOW (ref 1.7–2.4)

## 2023-12-31 MED ORDER — POTASSIUM CHLORIDE CRYS ER 20 MEQ PO TBCR
40.0000 meq | EXTENDED_RELEASE_TABLET | Freq: Once | ORAL | Status: AC
  Administered 2023-12-31: 40 meq via ORAL
  Filled 2023-12-31: qty 2

## 2023-12-31 MED ORDER — MAGNESIUM SULFATE 4 GM/100ML IV SOLN
4.0000 g | Freq: Once | INTRAVENOUS | Status: AC
  Administered 2023-12-31: 4 g via INTRAVENOUS
  Filled 2023-12-31: qty 100

## 2023-12-31 MED ORDER — K PHOS MONO-SOD PHOS DI & MONO 155-852-130 MG PO TABS
500.0000 mg | ORAL_TABLET | Freq: Two times a day (BID) | ORAL | Status: AC
  Administered 2023-12-31 (×2): 500 mg via ORAL
  Filled 2023-12-31 (×2): qty 2

## 2023-12-31 NOTE — Evaluation (Signed)
 Physical Therapy Evaluation Patient Details Name: Derrick Blanchard MRN: [id number] DOB: 1980/11/16 Today's Date: 12/31/2023  History of Present Illness  Derrick Blanchard is a 43 y.o. 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   Clinical Impression  Pt admitted with above diagnosis. Pt currently with functional limitations due to the deficits listed below (see PT Problem List). Pt received in care of PT/OT co-eval for pt and therapist safety. Pt PTA was mod-I or independent with mobility. Pt unsure of current living situation at discharge.   To date, pt indep donning R shoe. Mod-I with bed mobility. Prior to mobility educated on safe use of knee scooter. Educated on benefits for maintaining NWB status of foot. Pt impulsive with transfers standing with poor LLE NWB placing heel on ground despite VC's and education. Reliant on CGA for safety. Pt maintains 200' on knee scooter CGA to supervision. Pt provided RW and hops via RW 20' to bathroom. Pt quick and unsteady with transfers despite education on lines/leads and NWB status completing impulsively sitting on toilet with once again, poor WB status on LLE despite cues. Pt left in care of OT. Pt with very poor safety awareness, understanding of importance of NWB and health implications, and high falls risk with varying DME. Pt will benefit from skilled PT services to address these impairments and maximize return to PLOF.      If plan is discharge home, recommend the following: A lot of help with walking and/or transfers;A lot of help with bathing/dressing/bathroom;Assistance with cooking/housework;Assist for transportation;Help with stairs or ramp for entrance   Can travel by private vehicle   Yes    Equipment  Recommendations Other (comment) (TBD by next venue of care)  Recommendations for Other Services       Functional Status Assessment Patient has had a recent decline in their functional status and demonstrates the ability to make significant improvements in function in a reasonable and predictable amount of time.     Precautions / Restrictions Precautions Precautions: Fall Recall of Precautions/Restrictions: Intact Restrictions Weight Bearing Restrictions Per Provider Order: Yes LLE Weight Bearing Per Provider Order: Non weight bearing      Mobility  Bed Mobility Overal bed mobility: Modified Independent               Patient Response: Cooperative  Transfers Overall transfer level: Needs assistance   Transfers: Sit to/from Stand Sit to Stand: Contact guard assist           General transfer comment: unsteady, decline RW use and does not maintain NWB status.    Ambulation/Gait Ambulation/Gait assistance: Contact guard assist Gait Distance (Feet): 220 Feet (20' hopping with RW) Assistive device: Rolling walker (2 wheels), Knee scooter         General Gait Details: 200' trialing knee scooter. 20' hopping to bathroom  Stairs            Wheelchair Mobility     Tilt Bed Tilt Bed Patient Response: Cooperative  Modified Rankin (Stroke Patients Only)       Balance Overall balance assessment: Needs assistance Sitting-balance support: No upper extremity supported, Feet supported Sitting balance-Leahy Scale: Good     Standing balance support: Single extremity supported, No upper extremity supported Standing balance-Leahy Scale: Poor Standing balance comment: reliant on AD  Pertinent Vitals/Pain Pain Assessment Pain Assessment: Faces Faces Pain Scale: Hurts a little bit Pain Descriptors / Indicators: Discomfort, Grimacing Pain Intervention(s): Limited activity within patient's tolerance, Monitored during  session, Repositioned    Home Living Family/patient expects to be discharged to:: Unsure Living Arrangements: Non-relatives/Friends Available Help at Discharge: Available PRN/intermittently;Friend(s) Type of Home: Apartment Home Access: Stairs to enter Entrance Stairs-Rails: None Entrance Stairs-Number of Steps: 3   Home Layout: One level Home Equipment: Agricultural consultant (2 wheels) Additional Comments: reports having a RW.    Prior Function Prior Level of Function : Independent/Modified Independent             Mobility Comments: Pt not clear on maintaining NWB status and using RW or not at home. Per EMR was admitted after walking his dog. So is some level ambulatory.       Extremity/Trunk Assessment   Upper Extremity Assessment Upper Extremity Assessment: Overall WFL for tasks assessed    Lower Extremity Assessment Lower Extremity Assessment: Generalized weakness       Communication   Communication Communication: No apparent difficulties    Cognition Arousal: Alert Behavior During Therapy: WFL for tasks assessed/performed   PT - Cognitive impairments: No apparent impairments                         Following commands: Intact       Cueing Cueing Techniques: Verbal cues, Tactile cues     General Comments      Exercises Other Exercises Other Exercises: importance maintaining NWB   Assessment/Plan    PT Assessment Patient needs continued PT services  PT Problem List Decreased strength;Decreased activity tolerance;Decreased balance;Decreased mobility;Pain;Decreased coordination;Decreased knowledge of use of DME       PT Treatment Interventions DME instruction;Neuromuscular re-education;Gait training;Stair training;Patient/family education;Functional mobility training;Therapeutic activities;Therapeutic exercise;Balance training    PT Goals (Current goals can be found in the Care Plan section)  Acute Rehab PT Goals Patient Stated Goal: to  return to PLOF PT Goal Formulation: With patient Time For Goal Achievement: 01/01/24 Potential to Achieve Goals: Good    Frequency Min 2X/week     Co-evaluation PT/OT/SLP Co-Evaluation/Treatment: Yes Reason for Co-Treatment: Complexity of the patient's impairments (multi-system involvement);Necessary to address cognition/behavior during functional activity PT goals addressed during session: Mobility/safety with mobility;Proper use of DME OT goals addressed during session: ADL's and self-care       AM-PAC PT "6 Clicks" Mobility  Outcome Measure Help needed turning from your back to your side while in a flat bed without using bedrails?: A Little Help needed moving from lying on your back to sitting on the side of a flat bed without using bedrails?: A Little Help needed moving to and from a bed to a chair (including a wheelchair)?: A Lot Help needed standing up from a chair using your arms (e.g., wheelchair or bedside chair)?: A Lot Help needed to walk in hospital room?: A Lot Help needed climbing 3-5 steps with a railing? : Total 6 Click Score: 13    End of Session Equipment Utilized During Treatment: Gait belt Activity Tolerance: Patient tolerated treatment well Patient left: Other (comment) (in care of OT in bathroom)   PT Visit Diagnosis: Other abnormalities of gait and mobility (R26.89);Difficulty in walking, not elsewhere classified (R26.2);Muscle weakness (generalized) (M62.81);Pain Pain - Right/Left: Left Pain - part of body: Ankle and joints of foot    Time: 2952-8413 PT Time Calculation (min) (ACUTE ONLY): 18 min   Charges:  PT Evaluation $PT Eval Moderate Complexity: 1 Mod   PT General Charges $$ ACUTE PT VISIT: 1 Visit        Marc Senior. Fairly IV, PT, DPT Physical Therapist- [place]  [facility]  12/31/2023, 3:33 PM

## 2023-12-31 NOTE — Evaluation (Signed)
 Occupational Therapy Evaluation Patient Details Name: Derrick Blanchard MRN: [id number] DOB: September 06, 1980 Today's Date: 12/31/2023   History of Present Illness   Derrick Blanchard is a 43 y.o. 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     Clinical Impressions Patient presenting with decreased Ind in self care,balance, functional mobility/transfers, endurance, and safety awareness. Patient reports being Ind at baseline and living with roommate. Pt left hospital NWB but when asked about RW for mobility he reports that he "has one and can use it if I need to". Pt needing to be told multiple times during session to remains NWB on L LE during transfers for toileting needs. Pt using knee scooter as well with cuing for safety. Pt very impulsive throughout session.  Patient will benefit from acute OT to increase overall independence in the areas of ADLs, functional mobility, and safety awareness in order to safely discharge.     If plan is discharge home, recommend the following:   A lot of help with walking and/or transfers;A little help with bathing/dressing/bathroom;Help with stairs or ramp for entrance     Functional Status Assessment   Patient has had a recent decline in their functional status and demonstrates the ability to make significant improvements in function in a reasonable and predictable amount of time.     Equipment Recommendations   BSC/3in1      Precautions/Restrictions   Precautions Precautions: Fall Recall of Precautions/Restrictions: Intact Restrictions Weight Bearing Restrictions Per Provider Order: Yes LLE Weight Bearing Per Provider Order: Non weight bearing     Mobility Bed Mobility Overal bed mobility: Modified  Independent                  Transfers Overall transfer level: Needs assistance Equipment used: Rolling walker (2 wheels) Transfers: Sit to/from Stand Sit to Stand: Contact guard assist           General transfer comment: unsteady, decline RW use and does not maintain NWB status.      Balance Overall balance assessment: Needs assistance Sitting-balance support: No upper extremity supported, Feet supported Sitting balance-Leahy Scale: Good     Standing balance support: Single extremity supported, No upper extremity supported Standing balance-Leahy Scale: Poor Standing balance comment: reliant on AD                           ADL either performed or assessed with clinical judgement   ADL Overall ADL's : Needs assistance/impaired                         Toilet Transfer: Minimal assistance;Regular Toilet;Rolling walker (2 wheels);Grab bars   Toileting- Clothing Manipulation and Hygiene: Minimal assistance;Sitting/lateral lean               Vision Patient Visual Report: No change from baseline              Pertinent Vitals/Pain Pain Assessment Pain Assessment: Faces Faces Pain Scale: Hurts a little bit Pain Location: L foot Pain Descriptors / Indicators: Discomfort, Grimacing Pain Intervention(s): Limited activity within patient's tolerance, Monitored during session, Repositioned     Extremity/Trunk Assessment Upper Extremity Assessment Upper Extremity Assessment: Overall WFL for tasks assessed   Lower Extremity Assessment Lower Extremity Assessment: Defer to  PT evaluation       Communication Communication Communication: No apparent difficulties   Cognition Arousal: Alert Behavior During Therapy: Impulsive Cognition: No apparent impairments                               Following commands: Intact       Cueing  General Comments   Cueing Techniques: Verbal cues;Tactile cues              Home  Living Family/patient expects to be discharged to:: Unsure Living Arrangements: Non-relatives/Friends Available Help at Discharge: Available PRN/intermittently;Friend(s) Type of Home: Apartment Home Access: Stairs to enter Entrance Stairs-Number of Steps: 3 Entrance Stairs-Rails: None Home Layout: One level     Bathroom Shower/Tub: Tub/shower unit     Bathroom Accessibility: No   Home Equipment: Agricultural consultant (2 wheels)   Additional Comments: reports having a RW.      Prior Functioning/Environment Prior Level of Function : Independent/Modified Independent             Mobility Comments: Pt not clear on maintaining NWB status and using RW or not at home. Per EMR was admitted after walking his dog. So is some level ambulatory. ADLs Comments: independent    OT Problem List: Decreased strength;Decreased activity tolerance;Impaired balance (sitting and/or standing);Decreased safety awareness   OT Treatment/Interventions: Self-care/ADL training;Therapeutic exercise;Energy conservation;DME and/or AE instruction;Therapeutic activities;Patient/family education;Balance training      OT Goals(Current goals can be found in the care plan section)   Acute Rehab OT Goals Patient Stated Goal: decrease pain OT Goal Formulation: With patient Time For Goal Achievement: 01/14/24 Potential to Achieve Goals: Fair ADL Goals Pt Will Perform Grooming: with modified independence;standing Pt Will Perform Lower Body Dressing: with modified independence;sitting/lateral leans Pt Will Transfer to Toilet: with modified independence;ambulating Pt Will Perform Toileting - Clothing Manipulation and hygiene: with modified independence;sitting/lateral leans   OT Frequency:  Min 1X/week    Co-evaluation PT/OT/SLP Co-Evaluation/Treatment: Yes Reason for Co-Treatment: Complexity of the patient's impairments (multi-system involvement);Necessary to address cognition/behavior during functional  activity PT goals addressed during session: Mobility/safety with mobility;Proper use of DME OT goals addressed during session: ADL's and self-care      AM-PAC OT "6 Clicks" Daily Activity     Outcome Measure Help from another person eating meals?: None Help from another person taking care of personal grooming?: A Little Help from another person toileting, which includes using toliet, bedpan, or urinal?: A Little Help from another person bathing (including washing, rinsing, drying)?: A Little Help from another person to put on and taking off regular upper body clothing?: None Help from another person to put on and taking off regular lower body clothing?: A Lot 6 Click Score: 19   End of Session Equipment Utilized During Treatment: Rolling walker (2 wheels) Nurse Communication: Mobility status  Activity Tolerance: Patient tolerated treatment well Patient left: in bed;with call bell/phone within reach  OT Visit Diagnosis: Other abnormalities of gait and mobility (R26.89);Muscle weakness (generalized) (M62.81)                Time: 1610-9604 OT Time Calculation (min): 34 min Charges:  OT General Charges $OT Visit: 1 Visit OT Evaluation $OT Eval Low Complexity: 1 Low OT Treatments $Self Care/Home Management : 8-22 mins  George Kinder, MS, OTR/L , CBIS ascom [phone]  12/31/23, 4:02 PM

## 2023-12-31 NOTE — Discharge Instructions (Addendum)
 Your nurse navigator, Tiffanie, can be reached at [phone]  Rent/Utility/Housing  Agency Name: [facility] Address: [address] [place], [place] [address] Phone: [phone] Email: [email or web address] Website: [email or web address] Service(s) Offered: Housing services, self-sufficiency, congregate meal program, weatherization program, Field seismologist program, emergency food assistance,  housing counseling, home ownership program, wheels -towork program.  Agency Name: [facility] Address: [address], [place], [place] [address] Phone: [phone] (8a-4p) [phone] (8p- 10p) Email: [email or web address] Website: [email or web address] Service(s) Offered: A program for homeless and/or needy men that includes one-on-one counseling, life skills training and job rehabilitation.  Agency Name: [facility] Address: [address], [place], [place] [address] Phone: [phone] Website: www.alliedchurches.org Service(s) Offered: Assistance to needy in emergency with utility bills, heating fuel, and prescriptions. Shelter for homeless 7pm-7am. December 29, 2016 15  Agency Name: [facility] [place] [facility] Address: [address], [place], [place] [address] Phone: [phone] Contact Person: Genie Key Email: [email or web address] Website: [email or web address] Service(s) Offered: Helps individuals with developmental disabilities move from housing that is more restrictive to homes where they  can achieve greater independence and have more  opportunities.  Agency Name: [facility] Address: [address], [place], [place] [address] Phone: [phone] Email: [email or web address] Website: www.[email or web address].org Service(s) Offered: Provides affordable housing for low-income families, elderly, and disabled individuals. Offer a wide  range of  programs and services, from financial planning to afterschool and summer programs.  Agency Name: [facility] Address: [address] [place], [place] [address] Phone: [phone] Service(s) Offered: Child support services; child welfare services; food stamps; Medicaid; work first family assistance; and aid with fuel,  rent, food and medicine.  Agency Name: [facility] [place] [employer] Address: [facility] [address], [place], [place]  [address] Phone: [phone] Website: www.familyabuseservices.org Service(s) Offered: 24 hour Crisis Line: [phone]; 24 hour Emergency Shelter; Transitional Housing; Support Groups; Scientist, physiological; [employer]; Hispanic Outreach: [phone];  Visitation Center: [phone].  Agency Name: [facility] [place]. Address: [address] [place], [place] [address] Phone: [phone] Service(s) Offered: CAP Services; Home and [employer]; Individual or Group Supports; Respite Care Non-Institutional Nursing;  Residential Supports; Respite Care and Personal Care Services; Transportation; Family and Friends Night; Recreational Activities; Three Nutritious Meals/Snacks; Consultation with Registered Dietician; Twenty-four hour Registered Nurse Access; Daily and [employer]; Camp [place]aves; [place] for the [employer] (During Summer Months) Bingo Night (Every  Wednesday Night); Special Populations Dance Night  (Every Tuesday Night); Professional Hair Care Services.  Agency Name: [facility] Address: [address], [place], [place] [address] Phone: [phone] Contact Person: Richardo Chandler Website: [email or web address].Physicist, medical) Offered: Residential treatment facility for women; food and  clothing, educational & employment development and  transportation to work; Counsellor of financial skills;  parenting and  family reunification; emotional and spiritual  support; transitional housing for program graduates.  Agency Name: [facility] Address: [address], [place], [place] [address] Phone: [phone] Email: [email or web address] Website: TaskTown.es Service(s) Offered: Public housing units for elderly, disabled, and low income people; housing choice vouchers for income eligible  applicants; shelter plus care vouchers; and Psychologist, clinical.  Agency Name: [facility] [employer] Address: [address], [place], [place] [address] Phone: [phone] Email: [email or web address] Website: www.habitatalamance.org Service(s) Offered: Build houses for families in need of decent housing. Each adult in the family must invest 200 hours of labor on  someone else's house,  work with volunteers to build their own house, attend classes on budgeting, home maintenance, yard care, and attend homeowner association meetings.  Agency Name: [facility] Address: [address], [place], [place] [address] Phone: [phone] Website: www.rsli.org Service(s) Offered: Intermediate care facilities for intellectually delayed, Supervised Living in group homes for adults with developmental disabilities, Supervised Living for people who have dual diagnoses (MRMI), Independent Living, Supported Living, respite and a variety of CAP services, pre-vocational services, day supports, and [employer].  Agency Name: [place] [employer] [facility] Phone: [phone] Website: www.NCForeclosurePrevention.gov Service(s) Offered: Zero-interest, deferred loans to homeowners struggling to pay their mortgage. Call for more information.  Shelters Resource List  [employer] RESCUE M[facility]ION PROVIDED BY: [facility] [address] [place], [place], [place] Offers a faith-based shelter for homeless men, usually with substance use disorders. Residents  receive counseling, life skills training, and help finding a job.  HOMELESS SHELTER PROVIDED BY: [facility] [address] [place], [place], [place] Offers a shelter for men, women, and families experiencing homelessness. Food, clothing and other items are available for residents. Also offers support and services to help residents become self-sufficient. Offers temporary emergency housing for 30 days. Additional shelter may be available when temperatures drop below freezing but is not guaranteed.  FAMILY ABUSE SERVICES OF [facility] COUNTY PROVIDED BY: FAMILY ABUSE SERVICES OF [facility] [address], [place], [place] Offers services for victims of domestic violence. Offers a 24-hour crisis line and emergency shelter. Offers information and referrals to other community resources. Also offers court advocacy and support groups.  HOUSING CHOICE VOUCHER PROGRAM PROVIDED BY: [facility] [place] [address], [place], [place] Offers vouchers for approved Section 8 properties. Vouchers offer financial help with rent    [employer] PROVIDED BY: [facility], [place], [place] Offers emergency shelter for victims of domestic violence. Also offers a 24-hour crisis hotline for victims of domestic violence, safety planning, information and referrals, case management, and support groups for victims of domestic violence.   ACT TOGETHER EMERGENCY SHELTER PROVIDED BY: [facility] [address], [place], [place] Offers a 21-day emergency shelter for youth experiencing a family crisis, abuse, or homelessness. Case management, supportive services, healthcare services, and more are available for residents. SHELTER PROVIDED BY: [facility] [address], [place], [place] Offers a homeless shelter for people and families. Meals, showers, community referrals, case management, and more are available for residents. HEARTH  TRANSITIONAL LIVING PROGRAM PROVIDED BY: [facility] [address], [place], [place] Offers an 3-month homeless shelter for younger adults experiencing homelessness. Case management, independent living skills education, and more are available for residents. PARTNERSHIP VILLAGE PROVIDED BY: [facility] [address], [place], [place] Offers transitional housing for families and single people experiencing homelessness. Residents meet regularly with a case manager to work towards self-sufficiency TRANSITIONAL HOUSING PROVIDED BY: [facility] [address], [place], [place] Offers transitional housing for male veterans with disabilities. Residents receive meals, transportation, and clothing. Also offers support groups, nutrition classes, and peer support to residents.   WEAVER HOUSE PROVIDED BY: [facility] [address], [place], [place] Offers shelter to adult men and women. Guests receive hot meals and case management. Also offers overnight shelter when temperatures drop during cold winter months  EMERGENCY FAMILY SHELTER PROVIDED BY: [facility] [address], [place], [place] Offers shelter and support services for families experiencing homelessness.  Transportation Needs: Unmet Transportation Needs (12/27/2023)  PRAPARE - Administrator, Civil Service (Medical): Yes    Lack of Transportation (Non-Medical): No    Some PCP options in [place] area- not a comprehensive list  [facility]- [phone] [employer]- [phone] [facility]- [phone] [facility]- [phone] [facility]- [phone] [facility]- [phone]  or [facility] Physician Referral Line [phone]

## 2023-12-31 NOTE — Progress Notes (Signed)
 Progress Note    Derrick Blanchard  ZOX:[id number] DOB: 1980/09/13  DOA: 12/27/2023 PCP: Pcp, No      Brief Narrative:    Medical records reviewed and are as summarized below:  Derrick Blanchard is a 43 y.o. 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  About 3 days prior to admission, he was walking his dog when he felt a pop sensation and noticed that the wound on his left foot had opened up.  He noticed redness, swelling, increasing pain and foul-smelling drainage from the wound on the foot and left big toe.  He also complained of shakiness and was concerned about alcohol withdrawal.      Assessment/Plan:   Principal Problem:   Infection of toe as complication of amputation (HCC) Active Problems:   Alcohol use disorder, severe, dependence (HCC)   Alcoholic cirrhosis of liver (HCC)   Pancytopenia (HCC)   Hematochezia   Body mass index is 33.19 kg/m.  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 He is at risk for wound dehiscence and need for TMA Follow-up wound cultures Strict nonweightbearing left lower extremity, postop shoe for transfers only per podiatrist.    Recent I&D of left foot wound on 12/16/2023  He had fever on 12/30/2023.  No growth on blood cultures thus far.  Continue IV ceftriaxone , Flagyl  and Zyvox .   Alcohol use disorder, severe, dependence (HCC) Patient has an extensive history of alcohol use disorder complicated by cirrhosis.  He notes that he used to drink half a  gallon of liquor per day, but now with drinks several 40 ounces or up to 15 beers.  Taper off Librium  Use Ativan  as needed per CIWA protocol. Continue multivitamins and folic acid     Alcoholic cirrhosis of liver (HCC) Compensated    Pancytopenia (HCC) Cell lines are currently at baseline.  Monitor CBC    Hematochezia Resolved.  Patient endorses intermittent mild red blood on his stool.  Possibly due to to hemorrhoids. H&H is stable.  Hemoglobin is 8.4. No indication for blood transfusion at this time.    Hypokalemia, hypomagnesemia Continue potassium and magnesium  repletion On electrolyte protocol per pharmacy   Diet Order             Diet regular Room service appropriate? Yes; Fluid consistency: Thin  Diet effective now                            Consultants: Podiatrist  Procedures: S/p partial first ray amputation left foot, partial resection of second metatarsal left foot, application of dissolvable antibiotic beads left foot and application dermal allograft 38 cm left foot on 12/29/2023.    Medications:    chlordiazePOXIDE   25 mg Oral Daily   folic acid   1 mg Oral Daily   multivitamin with minerals  1 tablet Oral Daily   phosphorus  500 mg Oral BID   polyethylene glycol  17 g Oral Daily   senna-docusate  1 tablet Oral QHS   sodium chloride  flush  3 mL Intravenous Q12H  thiamine   100 mg Oral Daily   Or   thiamine   100 mg Intravenous Daily   Continuous Infusions:  cefTRIAXone  (ROCEPHIN )  IV 2 g (12/30/23 1702)   linezolid  (ZYVOX ) IV 600 mg (12/31/23 0909)   metronidazole  500 mg (12/31/23 0908)     Anti-infectives (From admission, onward)    Start     Dose/Rate Route Frequency Ordered Stop   12/29/23 1253  vancomycin  (VANCOCIN ) powder  Status:  Discontinued          As needed 12/29/23 1309 12/29/23 1311   12/29/23 1253  tobramycin  (NEBCIN ) injection  Status:  Discontinued          As needed 12/29/23 1317 12/29/23 1317   12/27/23 2200   linezolid  (ZYVOX ) IVPB 600 mg        600 mg 300 mL/hr over 60 Minutes Intravenous Every 12 hours 12/27/23 1319     12/27/23 2200  metroNIDAZOLE  (FLAGYL ) IVPB 500 mg        500 mg 100 mL/hr over 60 Minutes Intravenous Every 12 hours 12/27/23 1321     12/27/23 1800  cefTRIAXone  (ROCEPHIN ) 2 g in sodium chloride  0.9 % 100 mL IVPB        2 g 200 mL/hr over 30 Minutes Intravenous Every 24 hours 12/27/23 1321     12/27/23 1200  Ampicillin -Sulbactam (UNASYN ) 3 g in sodium chloride  0.9 % 100 mL IVPB       Placed in "And" Linked Group   3 g 200 mL/hr over 30 Minutes Intravenous  Once 12/27/23 1152 12/27/23 1300   12/27/23 1200  linezolid  (ZYVOX ) IVPB 600 mg       Placed in "And" Linked Group   600 mg 300 mL/hr over 60 Minutes Intravenous  Once 12/27/23 1152 12/27/23 1328              Family Communication/Anticipated D/C date and plan/Code Status   DVT prophylaxis: SCDs Start: 12/27/23 1307     Code Status: Full Code  Family Communication: None Disposition Plan: Plan to discharge home   Status is: Inpatient Remains inpatient appropriate because: Left foot infection       Subjective:   Interval events noted.  He complains of pain in the left foot.  Objective:    Vitals:   12/31/23 0030 12/31/23 0430 12/31/23 0740 12/31/23 1016  BP: 112/60 116/86 102/66 111/73  Pulse: 88  93 94  Resp:   16 16  Temp:   98.4 F (36.9 C) 98.6 F (37 C)  TempSrc:    Oral  SpO2:   96% 99%  Weight:      Height:       No data found.   Intake/Output Summary (Last 24 hours) at 12/31/2023 1310 Last data filed at 12/31/2023 0745 Gross per 24 hour  Intake 720 ml  Output 1000 ml  Net -280 ml   Filed Weights   12/27/23 1110 12/29/23 1146  Weight: 99.8 kg 99 kg    Exam:  GEN: NAD SKIN: Warm and dry EYES: No pallor or icterus ENT: MMM CV: RRR PULM: CTA B ABD: soft, ND, NT, +BS CNS: AAO x 3, non focal EXT: Dressed on left foot surgical wound is bloodstained      Data  Reviewed:   I have personally reviewed following labs and imaging studies:  Labs: Labs show the following:   Basic Metabolic Panel: Recent Labs  Lab 12/27/23 1114 12/28/23 0615 12/28/23 0619 12/29/23 0614 12/30/23 0459 12/31/23 0427  NA 141  --  137 135 132* 132*  K 3.8  --  3.4* 3.6 3.3* 3.5  CL 111  --  110 108 107 103  CO2 25  --  22 23 21* 24  GLUCOSE 118*  --  98 106* 110* 117*  BUN 8  --  9 8 6 6   CREATININE 0.65  --  0.59* 0.60* 0.58* 0.64  CALCIUM  8.8*  --  8.0* 8.1* 7.8* 7.8*  MG 1.7 1.5*  --  1.6* 1.4* 1.6*  PHOS  --  3.3  --  3.3 2.7 2.3*   GFR Estimated Creatinine Clearance: 139.9 mL/min (by C-G formula based on SCr of 0.64 mg/dL). Liver Function Tests: Recent Labs  Lab 12/27/23 1114 12/28/23 0619  AST 60* 42*  ALT 26 21  ALKPHOS 98 81  BILITOT 2.0* 1.8*  PROT 6.4* 5.5*  ALBUMIN 3.1* 2.5*   No results for input(s): "LIPASE", "AMYLASE" in the last 168 hours. No results for input(s): "AMMONIA" in the last 168 hours. Coagulation profile No results for input(s): "INR", "PROTIME" in the last 168 hours.  CBC: Recent Labs  Lab 12/27/23 1114 12/28/23 0619 12/29/23 0614 12/30/23 0459  WBC 2.7* 1.3* 1.5* 4.1  NEUTROABS 1.9  --  0.9* 3.1  HGB 9.3* 8.2* 8.3* 8.4*  HCT 29.4* 25.9* 26.2* 25.9*  MCV 81.9 81.2 82.1 81.2  PLT 46* 37* 44* 49*   Cardiac Enzymes: No results for input(s): "CKTOTAL", "CKMB", "CKMBINDEX", "TROPONINI" in the last 168 hours. BNP (last 3 results) No results for input(s): "PROBNP" in the last 8760 hours. CBG: Recent Labs  Lab 12/30/23 0846 12/30/23 1208  GLUCAP 112* 117*   D-Dimer: No results for input(s): "DDIMER" in the last 72 hours. Hgb A1c: No results for input(s): "HGBA1C" in the last 72 hours. Lipid Profile: No results for input(s): "CHOL", "HDL", "LDLCALC", "TRIG", "CHOLHDL", "LDLDIRECT" in the last 72 hours. Thyroid function studies: No results for input(s): "TSH", "T4TOTAL", "T3FREE", "THYROIDAB" in the last 72  hours.  Invalid input(s): "FREET3" Anemia work up: No results for input(s): "VITAMINB12", "FOLATE", "FERRITIN", "TIBC", "IRON", "RETICCTPCT" in the last 72 hours. Sepsis Labs: Recent Labs  Lab 12/27/23 1114 12/27/23 1424 12/28/23 0619 12/29/23 0614 12/30/23 0459  WBC 2.7*  --  1.3* 1.5* 4.1  LATICACIDVEN 1.4 1.9  --   --   --     Microbiology Recent Results (from the past 240 hours)  Aerobic/Anaerobic Culture w Gram Stain (surgical/deep wound)     Status: None (Preliminary result)   Collection Time: 12/29/23 12:41 PM   Specimen: Wound; Tissue  Result Value Ref Range Status   Specimen Description   Final    TISSUE BONE LEFT FOOT Performed at [facility], [address] [place], [place] [address]    Special Requests   Final    NONE Performed at [facility], [address] [place], [place] [address]    Gram Stain   Final    NO WBC SEEN RARE GRAM POSITIVE COCCI IN PAIRS IN SINGLES    Culture   Final    CULTURE REINCUBATED FOR BETTER GROWTH Performed at [facility], [address], [place], [place] [address]    Report Status PENDING  Incomplete  Culture, blood (Routine X 2) w Reflex to ID Panel     Status: None (Preliminary result)   Collection Time: 12/30/23  8:08 AM   Specimen: BLOOD  Result Value Ref Range Status   Specimen Description BLOOD RAC  Final  Special Requests   Final    BOTTLES DRAWN AEROBIC AND ANAEROBIC Blood Culture adequate volume   Culture   Final    NO GROWTH < 24 HOURS Performed at [facility], [address] [place], [place] [address]    Report Status PENDING  Incomplete  Culture, blood (Routine X 2) w Reflex to ID Panel     Status: None (Preliminary result)   Collection Time: 12/30/23  8:08 AM   Specimen: BLOOD  Result Value Ref Range Status   Specimen Description BLOOD [facility]  Final   Special Requests   Final    BOTTLES DRAWN AEROBIC AND ANAEROBIC Blood Culture adequate volume   Culture   Final    NO  GROWTH < 24 HOURS Performed at [facility], [address] [place], [place] [address]    Report Status PENDING  Incomplete    Procedures and diagnostic studies:  DG Foot 2 Views Left Result Date: 12/29/2023 CLINICAL DATA:  Postoperative. EXAM: LEFT FOOT - 2 VIEW COMPARISON:  Left foot x-ray 12/27/2023 FINDINGS: There is been amputation of the mid and proximal second and first metatarsals as well as first phalanx. Antibiotic seeds are seen overlying the surgical site. There is soft tissue swelling, soft tissue air and skin staples compatible with recent surgery. There is no acute fracture or dislocation. There is dorsal foot soft tissue swelling. IMPRESSION: Status post amputation of the mid and proximal second and first metatarsals as well as first phalanx. Electronically Signed   By: Tyron Gallon M.D.   On: 12/29/2023 17:35               LOS: 4 days   Eyob Godlewski  [facility]   Pager on [email or web address]. If 7PM-7AM, please contact night-coverage at [email or web address]     12/31/2023, 1:10 PM

## 2023-12-31 NOTE — Progress Notes (Signed)
 Subjective:  Patient ID: Derrick Blanchard, male    DOB: 1981-03-15,  MRN: [id number]  Patient s/p left partial ray amputation and partial second metatarsal resection with application of allograft and antibiotic beads. Realtes he is getting pain. Denies n/v/c. Did have more bleeding through dressing this morning and patient does relate he hobbled to the bathroom.   Past Medical History:  Diagnosis Date   Anemia    Anxiety    Cirrhosis (HCC)    Depression    Heart murmur    Hepatitis    Hypertension    Substance abuse (HCC)      Past Surgical History:  Procedure Laterality Date   COLONOSCOPY     ESOPHAGOGASTRODUODENOSCOPY (EGD) WITH PROPOFOL  N/A 02/03/2015   Procedure: ESOPHAGOGASTRODUODENOSCOPY (EGD) WITH PROPOFOL ;  Surgeon: Luella Sager, MD;  Location: [facility] ENDOSCOPY;  Service: Endoscopy;  Laterality: N/A;   ESOPHAGOGASTRODUODENOSCOPY (EGD) WITH PROPOFOL  N/A 12/21/2017   Procedure: ESOPHAGOGASTRODUODENOSCOPY (EGD) WITH PROPOFOL ;  Surgeon: Luke Salaam, MD;  Location: [facility] ENDOSCOPY;  Service: Gastroenterology;  Laterality: N/A;   FISSURECTOMY     IRRIGATION AND DEBRIDEMENT FOOT Left 12/16/2023   Procedure: IRRIGATION AND DEBRIDEMENT FOOT, possible sesamoidectomy, bone biopsy;  Surgeon: Reina Cara, DPM;  Location: [facility] ORS;  Service: Podiatry;  Laterality: Left;  Pulse lavage, bone biopsy with jam shidi needle, mini c arm, sagittal saw available       Latest Ref Rng & Units 12/30/2023    4:59 AM 12/29/2023    6:14 AM 12/28/2023    6:19 AM  CBC  WBC 4.0 - 10.5 K/uL 4.1  1.5  1.3   Hemoglobin 13.0 - 17.0 g/dL 8.4  8.3  8.2   Hematocrit 39.0 - 52.0 % 25.9  26.2  25.9   Platelets 150 - 400 K/uL 49  44  37        Latest Ref Rng & Units 12/31/2023    4:27 AM 12/30/2023    4:59 AM 12/29/2023    6:14 AM  BMP  Glucose 70 - 99 mg/dL 956  213  086   BUN 6 - 20 mg/dL 6  6  8    Creatinine 0.61 - 1.24 mg/dL 5.78  4.69  6.29   Sodium 135 - 145 mmol/L 132  132  135   Potassium  3.5 - 5.1 mmol/L 3.5  3.3  3.6   Chloride 98 - 111 mmol/L 103  107  108   CO2 22 - 32 mmol/L 24  21  23    Calcium  8.9 - 10.3 mg/dL 7.8  7.8  8.1      Objective:   Vitals:   12/31/23 0740 12/31/23 1016  BP: 102/66 111/73  Pulse: 93 94  Resp: 16 16  Temp: 98.4 F (36.9 C) 98.6 F (37 C)  SpO2: 96% 99%    General:AA&O x 3. Normal mood and affect   Vascular: DP and PT pulses 2/4 bilateral. Brisk capillary refill to all digits. Pedal hair present   Neruological. Epicritic sensation grossly intact.   Derm: incision intact no signs of dehiscence. Some mild duskiness and necrotic changes to distal incision edema.   MSK: MMT 5/5 in dorsiflexion, plantar flexion, inversion and eversion. Normal joint ROM without pain or crepitus.       Assessment & Plan:  Patient was evaluated and treated and all questions answered.  DX: s/p left partial firstt ray and partial resection second metatarsal on left foot  Patient to keep dressing clean dry and intact and is  to be strictly NWB. Discussed with patient he needs to be NWB as the risk of dehiscence and further surgery is high for him.  He may have Post-op shoe for transfer only.  Patient is at risk for dehiscence and need for TMA.  Will follow-cultures. Pending.  Continue IV antibiotics and pending blood culture.  Will continue to follow.   Jennefer Moats, DPM  Accessible via secure chat for questions or concerns.

## 2023-12-31 NOTE — Consult Note (Signed)
 PHARMACY CONSULT NOTE  Pharmacy Consult for Electrolyte Monitoring and Replacement   Recent Labs: Potassium (mmol/L)  Date Value  12/31/2023 3.5  02/12/2013 4.7   Magnesium  (mg/dL)  Date Value  16/06/9603 1.6 (L)  02/11/2013 2.0   Calcium  (mg/dL)  Date Value  54/05/8118 7.8 (L)   Calcium , Total (mg/dL)  Date Value  14/78/2956 8.3 (L)   Albumin (g/dL)  Date Value  21/30/8657 2.5 (L)  11/24/2017 2.9 (L)  02/11/2013 2.3 (L)   Phosphorus (mg/dL)  Date Value  84/69/6295 2.3 (L)   Sodium (mmol/L)  Date Value  12/31/2023 132 (L)  11/24/2017 144  02/12/2013 138   Assessment: Derrick Blanchard is a 43 yo male who presented to the ED due to an open wound on their L foot. They have a history of recurrent hypomagnesemia. Patient also has extensive alcohol use disorder. They drink about half gallon of liquor per day. Pharmacy has been consulted to monitor this patient's electrolytes.   Goal of Therapy:  Electrolytes WNL  Plan:  K = 3.5, Kcl 40 mEq PO x 1 dose Mg = 1.6, magnesium  sulfate 4 g IV x 1 dose Phos = 2.3, K Phos  Neutral 500 mg BID x 2 doses Continue to monitor electrolytes daily   Thank you for letting pharmacy participate in this patient's care.  Page Boast 12/31/2023 7:24 AM

## 2024-01-01 ENCOUNTER — Encounter: Payer: Self-pay | Admitting: Podiatry

## 2024-01-01 DIAGNOSIS — T8740 Infection of amputation stump, unspecified extremity: Secondary | ICD-10-CM | POA: Diagnosis not present

## 2024-01-01 LAB — BASIC METABOLIC PANEL WITH GFR
Anion gap: 6 (ref 5–15)
BUN: 10 mg/dL (ref 6–20)
CO2: 24 mmol/L (ref 22–32)
Calcium: 7.8 mg/dL — ABNORMAL LOW (ref 8.9–10.3)
Chloride: 105 mmol/L (ref 98–111)
Creatinine, Ser: 0.71 mg/dL (ref 0.61–1.24)
GFR, Estimated: >60 mL/min (ref >60)
Glucose, Bld: 101 mg/dL — ABNORMAL HIGH (ref 70–99)
Potassium: 3.5 mmol/L (ref 3.5–5.1)
Sodium: 135 mmol/L (ref 135–145)

## 2024-01-01 LAB — CBC
HCT: 27.2 % — ABNORMAL LOW (ref 39.0–52.0)
Hemoglobin: 8.5 g/dL — ABNORMAL LOW (ref 13.0–17.0)
MCH: 26 pg (ref 26.0–34.0)
MCHC: 31.3 g/dL (ref 30.0–36.0)
MCV: 83.2 fL (ref 80.0–100.0)
Platelets: 65 10*3/uL — ABNORMAL LOW (ref 150–400)
RBC: 3.27 MIL/uL — ABNORMAL LOW (ref 4.22–5.81)
RDW: 18.5 % — ABNORMAL HIGH (ref 11.5–15.5)
WBC: 3 10*3/uL — ABNORMAL LOW (ref 4.0–10.5)
nRBC: 0 % (ref 0.0–0.2)

## 2024-01-01 LAB — PHOSPHORUS: Phosphorus: 3.3 mg/dL (ref 2.5–4.6)

## 2024-01-01 LAB — MAGNESIUM: Magnesium: 2 mg/dL (ref 1.7–2.4)

## 2024-01-01 MED ORDER — OXYCODONE HCL 5 MG PO TABS
10.0000 mg | ORAL_TABLET | ORAL | Status: DC | PRN
  Administered 2024-01-01 – 2024-01-04 (×12): 10 mg via ORAL
  Filled 2024-01-01 (×13): qty 2

## 2024-01-01 MED ORDER — LINEZOLID 600 MG PO TABS
600.0000 mg | ORAL_TABLET | Freq: Two times a day (BID) | ORAL | Status: DC
  Administered 2024-01-01 – 2024-01-04 (×7): 600 mg via ORAL
  Filled 2024-01-01 (×7): qty 1

## 2024-01-01 MED ORDER — OXYCODONE HCL 5 MG PO TABS
10.0000 mg | ORAL_TABLET | ORAL | Status: DC | PRN
Start: 2024-01-01 — End: 2024-01-01

## 2024-01-01 MED ORDER — METRONIDAZOLE 500 MG PO TABS
500.0000 mg | ORAL_TABLET | Freq: Two times a day (BID) | ORAL | Status: DC
  Administered 2024-01-01 – 2024-01-03 (×5): 500 mg via ORAL
  Filled 2024-01-01 (×5): qty 1

## 2024-01-01 MED ORDER — HYDROMORPHONE HCL 1 MG/ML IJ SOLN
0.5000 mg | INTRAMUSCULAR | Status: AC | PRN
  Administered 2024-01-01: 0.5 mg via INTRAVENOUS
  Filled 2024-01-01: qty 0.5

## 2024-01-01 MED ORDER — POTASSIUM CHLORIDE CRYS ER 20 MEQ PO TBCR
40.0000 meq | EXTENDED_RELEASE_TABLET | Freq: Once | ORAL | Status: AC
  Administered 2024-01-01: 40 meq via ORAL
  Filled 2024-01-01: qty 2

## 2024-01-01 NOTE — Plan of Care (Signed)

## 2024-01-01 NOTE — Progress Notes (Signed)
 Subjective:  Patient ID: Derrick Blanchard, male    DOB: 06/22/1981,  MRN: [id number]  DOS: 12/29/23  Proc: 1.  Partial first ray amputation, left foot 2.  Partial resection of second metatarsal, left foot 3.  Application dissolvable antibiotic beads, left foot 4.  Application dermal allograft 38 cm, left foot  Patient s/p left partial ray amputation and partial second metatarsal resection with application of allograft and antibiotic beads.  He has a regular shoe and saturated dressing on with medical glove over the gauze wrap when I saw him. He has been walking all over the room and hall per RN. Had to be moved to a private room. States he has to leave to "get some medications" as well as to "pay bills / get money from someone"  I discussed he is at high risk for limb loss if he continues to walk around on his left foot.  Past Medical History:  Diagnosis Date   Anemia    Anxiety    Cirrhosis (HCC)    Depression    Heart murmur    Hepatitis    Hypertension    Substance abuse [facility])      Past Surgical History:  Procedure Laterality Date   AMPUTATION Left 12/29/2023   Procedure: AMPUTATION, FOOT, RAY;  Surgeon: Evertt Hoe, DPM;  Location: [facility] ORS;  Service: Orthopedics/Podiatry;  Laterality: Left;  Partial first ray amp, abx beads, graft application   COLONOSCOPY     ESOPHAGOGASTRODUODENOSCOPY (EGD) WITH PROPOFOL  N/A 02/03/2015   Procedure: ESOPHAGOGASTRODUODENOSCOPY (EGD) WITH PROPOFOL ;  Surgeon: Luella Sager, MD;  Location: [facility] ENDOSCOPY;  Service: Endoscopy;  Laterality: N/A;   ESOPHAGOGASTRODUODENOSCOPY (EGD) WITH PROPOFOL  N/A 12/21/2017   Procedure: ESOPHAGOGASTRODUODENOSCOPY (EGD) WITH PROPOFOL ;  Surgeon: Luke Salaam, MD;  Location: [facility] ENDOSCOPY;  Service: Gastroenterology;  Laterality: N/A;   FISSURECTOMY     IRRIGATION AND DEBRIDEMENT FOOT Left 12/16/2023   Procedure: IRRIGATION AND DEBRIDEMENT FOOT, possible sesamoidectomy, bone biopsy;  Surgeon:  Reina Cara, DPM;  Location: [facility] ORS;  Service: Podiatry;  Laterality: Left;  Pulse lavage, bone biopsy with jam shidi needle, mini c arm, sagittal saw available       Latest Ref Rng & Units 01/01/2024    5:23 AM 12/30/2023    4:59 AM 12/29/2023    6:14 AM  CBC  WBC 4.0 - 10.5 K/uL 3.0  4.1  1.5   Hemoglobin 13.0 - 17.0 g/dL 8.5  8.4  8.3   Hematocrit 39.0 - 52.0 % 27.2  25.9  26.2   Platelets 150 - 400 K/uL 65  49  44        Latest Ref Rng & Units 01/01/2024    5:23 AM 12/31/2023    4:27 AM 12/30/2023    4:59 AM  BMP  Glucose 70 - 99 mg/dL 846  962  952   BUN 6 - 20 mg/dL 10  6  6    Creatinine 0.61 - 1.24 mg/dL 8.41  3.24  4.01   Sodium 135 - 145 mmol/L 135  132  132   Potassium 3.5 - 5.1 mmol/L 3.5  3.5  3.3   Chloride 98 - 111 mmol/L 105  103  107   CO2 22 - 32 mmol/L 24  24  21    Calcium  8.9 - 10.3 mg/dL 7.8  7.8  7.8      Objective:   Vitals:   01/01/24 0459 01/01/24 0805  BP: (!) 91/56 (!) 99/59  Pulse:  72  Resp:  17  Temp:  (!) 97.5 F (36.4 C)  SpO2:  100%    General:AA&O x 3. Normal mood and affect   Vascular: DP and PT pulses 2/4 bilateral. Brisk capillary refill to all digits. Pedal hair present   Neruological. Epicritic sensation grossly intact.   Derm: incision intact no signs of dehiscence though has had oozing from incision. Increased duskiness and necrotic changes to distal incision as well as maceration of the surgical site increased from prior. Significant edema  MSK: MMT 5/5 in dorsiflexion, plantar flexion, inversion and eversion. Normal joint ROM without pain or crepitus.     Assessment & Plan:  Patient was evaluated and treated and all questions answered.  DX: s/p left partial firstt ray and partial resection second metatarsal on left foot   POD # 3 s/p above procedure - Due to patient non compliance including non stop ambulation on the left foot around the unit he has necrosed the distal aspect of the amputation site as well as has  maceration due to continued venous bleeding from the site/ severe edema. - He will now require Transmetatarsal amputation of the left foot for revision. This will be Wednesday AM. He will be required to remain NWB to the LLE after this procedure - failure to do so will almost certainly result in dehiscence and need for proximal limb amputation - he has been advised of this.  -NPO past MN Tuesday Night for OR Wednesday for Transmetatarsal amputation -HOLD ANTICOAGULATION 24 HRS PRE OPERATIVE  -WB Status: Strict non weightbearing to LLE -Medications/ABX: Continue broad spectrum abx -Foot redressed. Leave intact until surgery, reinforce as needed for strikethrough.   Evertt Hoe, DPM  Accessible via secure chat for questions or concerns.

## 2024-01-01 NOTE — Progress Notes (Signed)
 PHARMACIST - PHYSICIAN COMMUNICATION DR:   TRH CONCERNING: Antibiotic IV to Oral Route Change Policy  RECOMMENDATION: This patient is receiving linezolid  and metronidazole  by the intravenous route.  Based on criteria approved by the Pharmacy and Therapeutics Committee, the antibiotic(s) is/are being converted to the equivalent oral dose form(s).   DESCRIPTION: These criteria include: Patient being treated for a respiratory tract infection, urinary tract infection, cellulitis or clostridium difficile associated diarrhea if on metronidazole  The patient is not neutropenic and does not exhibit a GI malabsorption state The patient is eating (either orally or via tube) and/or has been taking other orally administered medications for a least 24 hours The patient is improving clinically and has a Tmax < 100.5  If you have questions about this conversion, please contact the Pharmacy Department  []   [phone] )  Cristine Done [x]   [phone] )  [facility] []   [phone] )  [facility] []   [phone] )  [facility] []   [phone] )  [facility]    Valma Gazella, PharmD, [place], [place] Work Cell: [phone] 01/01/2024 8:50 AM

## 2024-01-01 NOTE — Progress Notes (Signed)
 Found patient walking around in the room with his sneakers on both feet. He is not following the NWB on left foot. Left foot dressing soaked with blood.

## 2024-01-01 NOTE — Consult Note (Signed)
 PHARMACY CONSULT NOTE  Pharmacy Consult for Electrolyte Monitoring and Replacement   Recent Labs: Potassium (mmol/L)  Date Value  01/01/2024 3.5  02/12/2013 4.7   Magnesium  (mg/dL)  Date Value  81/19/1478 2.0  02/11/2013 2.0   Calcium  (mg/dL)  Date Value  29/56/2130 7.8 (L)   Calcium , Total (mg/dL)  Date Value  86/57/8469 8.3 (L)   Albumin (g/dL)  Date Value  62/95/2841 2.5 (L)  11/24/2017 2.9 (L)  02/11/2013 2.3 (L)   Phosphorus (mg/dL)  Date Value  32/44/0102 3.3   Sodium (mmol/L)  Date Value  01/01/2024 135  11/24/2017 144  02/12/2013 138   Assessment: Derrick Blanchard is a 43 yo male who presented to the ED due to an open wound on their L foot. They have a history of recurrent hypomagnesemia. Patient also has extensive alcohol use disorder. They drink about half gallon of liquor per day. Pharmacy has been consulted to monitor this patient's electrolytes.   Goal of Therapy:  Electrolytes WNL  Plan:  K = 3.5, Will order Kcl 40 mEq PO x 1 dose Continue to monitor electrolytes daily   Thank you for letting pharmacy participate in this patient's care.  Thomasine Flick PharmD Clinical Pharmacist 01/01/2024

## 2024-01-01 NOTE — Progress Notes (Signed)
 Progress Note    Derrick Blanchard  BMW:[id number] DOB: 1981-01-22  DOA: 12/27/2023 PCP: Pcp, No      Brief Narrative:    Medical records reviewed and are as summarized below:  Derrick Blanchard is a 43 y.o. 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  About 3 days prior to admission, he was walking his dog when he felt a pop sensation and noticed that the wound on his left foot had opened up.  He noticed redness, swelling, increasing pain and foul-smelling drainage from the wound on the foot and left big toe.  He also complained of shakiness and was concerned about alcohol withdrawal.      Assessment/Plan:   Principal Problem:   Infection of toe as complication of amputation (HCC) Active Problems:   Alcohol use disorder, severe, dependence (HCC)   Alcoholic cirrhosis of liver (HCC)   Pancytopenia (HCC)   Hematochezia   Body mass index is 33.19 kg/m.  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 He is at risk for wound dehiscence and need for TMA Wound culture is growing Enterococcus faecium. No pathology report yet Strict nonweightbearing left lower extremity, postop shoe for transfers only per podiatrist.   However, patient has been bearing weight on both feet despite being instructed multiple times not to bear any weight on the left lower extremity. Dr. Alvah Auerbach, podiatrist, was notified this morning about recurrent bleeding from left foot surgical wound.  Recent I&D of left foot wound on  12/16/2023  He had fever on 12/30/2023.  No growth on blood cultures thus far.  Continue IV ceftriaxone .  IV Flagyl  and Zyvox  changed to oral formulations per pharmacy protocol   Alcohol use disorder, severe, dependence (HCC) Patient has an extensive history of alcohol use disorder complicated by cirrhosis.  He notes that he used to drink half a gallon of liquor per day, but now with drinks several 40 ounces or up to 15 beers.  Completed Librium  on 01/01/2024 Use Ativan  as needed per CIWA protocol. Continue multivitamins and folic acid     Alcoholic cirrhosis of liver (HCC) Compensated    Pancytopenia (HCC) Cell lines are currently at baseline.  Monitor CBC    Hematochezia Resolved.  Patient endorses intermittent mild red blood on his stool.  Possibly due to to hemorrhoids. H&H is stable.  Hemoglobin is 8.4. No indication for blood transfusion at this time.    Hypokalemia, hypomagnesemia Improved. On electrolyte protocol per pharmacy    Diet Order             Diet regular Room service appropriate? Yes; Fluid consistency: Thin  Diet effective now                            Consultants: Podiatrist  Procedures: S/p partial first ray amputation left foot, partial resection of second metatarsal left foot, application of dissolvable antibiotic beads left foot and application dermal allograft 38 cm left foot on 12/29/2023.    Medications:    folic acid   1 mg Oral Daily  linezolid   600 mg Oral Q12H   metroNIDAZOLE   500 mg Oral Q12H   multivitamin with minerals  1 tablet Oral Daily   polyethylene glycol  17 g Oral Daily   senna-docusate  1 tablet Oral QHS   sodium chloride  flush  3 mL Intravenous Q12H   thiamine   100 mg Oral Daily   Or   thiamine   100 mg Intravenous Daily   Continuous Infusions:  cefTRIAXone  (ROCEPHIN )  IV 2 g (12/31/23 1808)     Anti-infectives (From admission, onward)    Start     Dose/Rate Route Frequency Ordered Stop    01/01/24 1000  metroNIDAZOLE  (FLAGYL ) tablet 500 mg        500 mg Oral Every 12 hours 01/01/24 0849     01/01/24 1000  linezolid  (ZYVOX ) tablet 600 mg        600 mg Oral Every 12 hours 01/01/24 0849     12/29/23 1253  vancomycin  (VANCOCIN ) powder  Status:  Discontinued          As needed 12/29/23 1309 12/29/23 1311   12/29/23 1253  tobramycin  (NEBCIN ) injection  Status:  Discontinued          As needed 12/29/23 1317 12/29/23 1317   12/27/23 2200  linezolid  (ZYVOX ) IVPB 600 mg  Status:  Discontinued        600 mg 300 mL/hr over 60 Minutes Intravenous Every 12 hours 12/27/23 1319 01/01/24 0849   12/27/23 2200  metroNIDAZOLE  (FLAGYL ) IVPB 500 mg  Status:  Discontinued        500 mg 100 mL/hr over 60 Minutes Intravenous Every 12 hours 12/27/23 1321 01/01/24 0849   12/27/23 1800  cefTRIAXone  (ROCEPHIN ) 2 g in sodium chloride  0.9 % 100 mL IVPB        2 g 200 mL/hr over 30 Minutes Intravenous Every 24 hours 12/27/23 1321     12/27/23 1200  Ampicillin -Sulbactam (UNASYN ) 3 g in sodium chloride  0.9 % 100 mL IVPB       Placed in "And" Linked Group   3 g 200 mL/hr over 30 Minutes Intravenous  Once 12/27/23 1152 12/27/23 1300   12/27/23 1200  linezolid  (ZYVOX ) IVPB 600 mg       Placed in "And" Linked Group   600 mg 300 mL/hr over 60 Minutes Intravenous  Once 12/27/23 1152 12/27/23 1328              Family Communication/Anticipated D/C date and plan/Code Status   DVT prophylaxis: SCDs Start: 12/27/23 1307     Code Status: Full Code  Family Communication: None Disposition Plan: Plan to discharge home   Status is: Inpatient Remains inpatient appropriate because: Left foot infection       Subjective:   Interval events noted.  He complains from bleeding from the wound on the left foot.  Unfortunately, patient has been walking on both feet despite specific instructions not to bear any weight on the left foot.  Objective:    Vitals:   01/01/24 0115 01/01/24 0455 01/01/24  0459 01/01/24 0805  BP: 108/60 (!) 91/56 (!) 91/56 (!) 99/59  Pulse: 86 74  72  Resp:    17  Temp:  97.9 F (36.6 C)  (!) 97.5 F (36.4 C)  TempSrc:      SpO2:  100%  100%  Weight:      Height:       No data found.   Intake/Output Summary (Last 24 hours) at 01/01/2024 1359 Last data  filed at 01/01/2024 1300 Gross per 24 hour  Intake 240 ml  Output --  Net 240 ml   Filed Weights   12/27/23 1110 12/29/23 1146  Weight: 99.8 kg 99 kg    Exam:  GEN: NAD SKIN: Warm and dry EYES: No pallor or icterus ENT: MMM CV: RRR PULM: CTA B ABD: soft, ND, NT, +BS CNS: AAO x 3, non focal EXT: Dressing on left foot surgical wound is soaked with blood       Data Reviewed:   I have personally reviewed following labs and imaging studies:  Labs: Labs show the following:   Basic Metabolic Panel: Recent Labs  Lab 12/28/23 0615 12/28/23 0619 12/29/23 0614 12/30/23 0459 12/31/23 0427 01/01/24 0523  NA  --  137 135 132* 132* 135  K  --  3.4* 3.6 3.3* 3.5 3.5  CL  --  110 108 107 103 105  CO2  --  22 23 21* 24 24  GLUCOSE  --  98 106* 110* 117* 101*  BUN  --  9 8 6 6 10   CREATININE  --  0.59* 0.60* 0.58* 0.64 0.71  CALCIUM   --  8.0* 8.1* 7.8* 7.8* 7.8*  MG 1.5*  --  1.6* 1.4* 1.6* 2.0  PHOS 3.3  --  3.3 2.7 2.3* 3.3   GFR Estimated Creatinine Clearance: 139.9 mL/min (by C-G formula based on SCr of 0.71 mg/dL). Liver Function Tests: Recent Labs  Lab 12/27/23 1114 12/28/23 0619  AST 60* 42*  ALT 26 21  ALKPHOS 98 81  BILITOT 2.0* 1.8*  PROT 6.4* 5.5*  ALBUMIN 3.1* 2.5*   No results for input(s): "LIPASE", "AMYLASE" in the last 168 hours. No results for input(s): "AMMONIA" in the last 168 hours. Coagulation profile No results for input(s): "INR", "PROTIME" in the last 168 hours.  CBC: Recent Labs  Lab 12/27/23 1114 12/28/23 0619 12/29/23 0614 12/30/23 0459 01/01/24 0523  WBC 2.7* 1.3* 1.5* 4.1 3.0*  NEUTROABS 1.9  --  0.9* 3.1  --   HGB 9.3* 8.2* 8.3*  8.4* 8.5*  HCT 29.4* 25.9* 26.2* 25.9* 27.2*  MCV 81.9 81.2 82.1 81.2 83.2  PLT 46* 37* 44* 49* 65*   Cardiac Enzymes: No results for input(s): "CKTOTAL", "CKMB", "CKMBINDEX", "TROPONINI" in the last 168 hours. BNP (last 3 results) No results for input(s): "PROBNP" in the last 8760 hours. CBG: Recent Labs  Lab 12/30/23 0846 12/30/23 1208  GLUCAP 112* 117*   D-Dimer: No results for input(s): "DDIMER" in the last 72 hours. Hgb A1c: No results for input(s): "HGBA1C" in the last 72 hours. Lipid Profile: No results for input(s): "CHOL", "HDL", "LDLCALC", "TRIG", "CHOLHDL", "LDLDIRECT" in the last 72 hours. Thyroid function studies: No results for input(s): "TSH", "T4TOTAL", "T3FREE", "THYROIDAB" in the last 72 hours.  Invalid input(s): "FREET3" Anemia work up: No results for input(s): "VITAMINB12", "FOLATE", "FERRITIN", "TIBC", "IRON", "RETICCTPCT" in the last 72 hours. Sepsis Labs: Recent Labs  Lab 12/27/23 1114 12/27/23 1424 12/28/23 0619 12/29/23 0614 12/30/23 0459 01/01/24 0523  WBC 2.7*  --  1.3* 1.5* 4.1 3.0*  LATICACIDVEN 1.4 1.9  --   --   --   --     Microbiology Recent Results (from the past 240 hours)  Aerobic/Anaerobic Culture w Gram Stain (surgical/deep wound)     Status: None (Preliminary result)   Collection Time: 12/29/23 12:41 PM   Specimen: Wound; Tissue  Result Value Ref Range Status   Specimen Description   Final  TISSUE BONE LEFT FOOT Performed at [facility], [address], [place], [place] [address]    Special Requests   Final    NONE Performed at [facility], [address] [place], [place] [address]    Gram Stain   Final    NO WBC SEEN RARE GRAM POSITIVE COCCI IN PAIRS IN SINGLES Performed at [facility], [address] [place], [place] [address]    Culture   Final    RARE ENTEROCOCCUS FAECIUM CULTURE REINCUBATED FOR BETTER GROWTH SUSCEPTIBILITIES TO FOLLOW NO ANAEROBES ISOLATED; CULTURE IN PROGRESS FOR  5 DAYS    Report Status PENDING  Incomplete  Culture, blood (Routine X 2) w Reflex to ID Panel     Status: None (Preliminary result)   Collection Time: 12/30/23  8:08 AM   Specimen: BLOOD  Result Value Ref Range Status   Specimen Description BLOOD RAC  Final   Special Requests   Final    BOTTLES DRAWN AEROBIC AND ANAEROBIC Blood Culture adequate volume   Culture   Final    NO GROWTH 2 DAYS Performed at [facility], [address], [place], [place] [address]    Report Status PENDING  Incomplete  Culture, blood (Routine X 2) w Reflex to ID Panel     Status: None (Preliminary result)   Collection Time: 12/30/23  8:08 AM   Specimen: BLOOD  Result Value Ref Range Status   Specimen Description BLOOD [facility]  Final   Special Requests   Final    BOTTLES DRAWN AEROBIC AND ANAEROBIC Blood Culture adequate volume   Culture   Final    NO GROWTH 2 DAYS Performed at [facility], [address], [place], [place] [address]    Report Status PENDING  Incomplete    Procedures and diagnostic studies:  No results found.              LOS: 5 days   Wassim Kirksey  [facility]   Pager on [email or web address]. If 7PM-7AM, please contact night-coverage at [email or web address]     01/01/2024, 1:59 PM

## 2024-01-02 DIAGNOSIS — T8740 Infection of amputation stump, unspecified extremity: Secondary | ICD-10-CM | POA: Diagnosis not present

## 2024-01-02 LAB — CBC
HCT: 25 % — ABNORMAL LOW (ref 39.0–52.0)
Hemoglobin: 7.9 g/dL — ABNORMAL LOW (ref 13.0–17.0)
MCH: 26.3 pg (ref 26.0–34.0)
MCHC: 31.6 g/dL (ref 30.0–36.0)
MCV: 83.3 fL (ref 80.0–100.0)
Platelets: 57 10*3/uL — ABNORMAL LOW (ref 150–400)
RBC: 3 MIL/uL — ABNORMAL LOW (ref 4.22–5.81)
RDW: 18.6 % — ABNORMAL HIGH (ref 11.5–15.5)
WBC: 1.8 10*3/uL — ABNORMAL LOW (ref 4.0–10.5)
nRBC: 0 % (ref 0.0–0.2)

## 2024-01-02 LAB — RENAL FUNCTION PANEL
Albumin: 2.8 g/dL — ABNORMAL LOW (ref 3.5–5.0)
Anion gap: 5 (ref 5–15)
BUN: 10 mg/dL (ref 6–20)
CO2: 24 mmol/L (ref 22–32)
Calcium: 8.3 mg/dL — ABNORMAL LOW (ref 8.9–10.3)
Chloride: 108 mmol/L (ref 98–111)
Creatinine, Ser: 0.57 mg/dL — ABNORMAL LOW (ref 0.61–1.24)
GFR, Estimated: >60 mL/min (ref >60)
Glucose, Bld: 103 mg/dL — ABNORMAL HIGH (ref 70–99)
Phosphorus: 3.5 mg/dL (ref 2.5–4.6)
Potassium: 3.9 mmol/L (ref 3.5–5.1)
Sodium: 137 mmol/L (ref 135–145)

## 2024-01-02 LAB — SURGICAL PATHOLOGY

## 2024-01-02 LAB — TYPE AND SCREEN
ABO/RH(D): O POS
Antibody Screen: NEGATIVE

## 2024-01-02 LAB — MAGNESIUM: Magnesium: 1.7 mg/dL (ref 1.7–2.4)

## 2024-01-02 MED ORDER — LORAZEPAM 2 MG/ML IJ SOLN
2.0000 mg | Freq: Four times a day (QID) | INTRAMUSCULAR | Status: DC | PRN
  Administered 2024-01-02 – 2024-01-03 (×2): 2 mg via INTRAVENOUS
  Filled 2024-01-02 (×3): qty 1

## 2024-01-02 MED ORDER — MAGNESIUM SULFATE 2 GM/50ML IV SOLN
2.0000 g | Freq: Once | INTRAVENOUS | Status: AC
  Administered 2024-01-02: 2 g via INTRAVENOUS
  Filled 2024-01-02: qty 50

## 2024-01-02 NOTE — Consult Note (Signed)
 PHARMACY CONSULT NOTE  Pharmacy Consult for Electrolyte Monitoring and Replacement   Recent Labs: Potassium (mmol/L)  Date Value  01/02/2024 3.9  02/12/2013 4.7   Magnesium  (mg/dL)  Date Value  16/06/9603 1.7  02/11/2013 2.0   Calcium  (mg/dL)  Date Value  54/05/8118 8.3 (L)   Calcium , Total (mg/dL)  Date Value  14/78/2956 8.3 (L)   Albumin (g/dL)  Date Value  21/30/8657 2.8 (L)  11/24/2017 2.9 (L)  02/11/2013 2.3 (L)   Phosphorus (mg/dL)  Date Value  84/69/6295 3.5   Sodium (mmol/L)  Date Value  01/02/2024 137  11/24/2017 144  02/12/2013 138   Assessment: Derrick Blanchard is a 43 yo male who presented to the ED due to an open wound on their L foot. They have a history of recurrent hypomagnesemia. Patient also has extensive alcohol use disorder. They drink about half gallon of liquor per day. Pharmacy has been consulted to monitor this patient's electrolytes.   Goal of Therapy:  Electrolytes WNL  Plan:  Mag 1.7  Will order Magnesium  sulfate 2 gm IV x 1 Continue to monitor electrolytes daily   Thank you for letting pharmacy participate in this patient's care.  Thomasine Flick PharmD Clinical Pharmacist 01/02/2024

## 2024-01-02 NOTE — Plan of Care (Signed)
 The patient was given PRN IV Ativan  for agitation this shift.

## 2024-01-02 NOTE — Plan of Care (Signed)
  Problem: Health Behavior/Discharge Planning: Goal: Ability to manage health-related needs will improve Outcome: Progressing   Problem: Clinical Measurements: Goal: Respiratory complications will improve Outcome: Progressing   Problem: Coping: Goal: Level of anxiety will decrease Outcome: Progressing   Problem: Pain Managment: Goal: General experience of comfort will improve and/or be controlled Outcome: Progressing

## 2024-01-02 NOTE — NC FL2 (Signed)
 [place]  MEDICAID FL2 LEVEL OF CARE FORM     IDENTIFICATION  Patient Name: Derrick Blanchard Birthdate: [birth year] Sex: male Admission Date (Current Location): 12/27/2023  [facility] and [place] Number:  Chiropodist and Address:  [facility], [address], [place], [place] [address]      Provider Number: [id number]  Attending Physician Name and Address:  Sheril Dines, MD  Relative Name and Phone Number:  Alpheus Arvin [phone]    Current Level of Care: Hospital Recommended Level of Care: Skilled Nursing Facility Prior Approval Number:    Date Approved/Denied:   PASRR Number: [id number]  Discharge Plan: SNF    Current Diagnoses: Patient Active Problem List   Diagnosis Date Noted   Infection of toe as complication of amputation (HCC) 12/27/2023   Wound of left foot 12/13/2023   Open wound of left lower extremity 12/13/2023   ETOH abuse 12/13/2023   Pyogenic inflammation of bone (HCC) 12/13/2023   Hypomagnesemia 03/13/2023   Diarrhea 03/12/2023   Severe sepsis (HCC) 03/11/2023   Alcoholic cirrhosis of liver (HCC) 03/11/2023   Chronic hepatitis C (HCC) 03/11/2023   Cellulitis of left lower extremity 03/11/2023   Ulcer of left foot with muscle involvement without evidence of necrosis (HCC) 03/11/2023   Nausea and vomiting 03/11/2023   Portal hypertension with esophageal varices (HCC) 11/27/2019   Thrombocytopenia, acquired (HCC) 11/27/2019   GERD (gastroesophageal reflux disease) 11/24/2017   Tachycardia 11/24/2017   Hypokalemia 10/25/2017   Thrombocytopenia (HCC) 10/25/2017   Advance care planning 10/25/2017   Depression, major, single episode, moderate (HCC) 10/25/2017   Elevated liver enzymes 09/28/2017   Hyperbilirubinemia    Alcohol use disorder, severe, dependence (HCC) 02/03/2015   Alcoholic hepatitis 01/30/2015   Hematochezia 01/30/2015   Coagulopathy (HCC) 01/30/2015   Hyponatremia 01/30/2015    Pancytopenia (HCC) 01/30/2015    Orientation RESPIRATION BLADDER Height & Weight     Time, Self, Place  Normal Continent Weight: 99 kg Height:  5\' 8"  (172.7 cm)  BEHAVIORAL SYMPTOMS/MOOD NEUROLOGICAL BOWEL NUTRITION STATUS      Continent Diet  AMBULATORY STATUS COMMUNICATION OF NEEDS Skin   Limited Assist Verbally Surgical wounds (Incision of left foot (guaze and compression wrap))                       Personal Care Assistance Level of Assistance  Bathing, Dressing Bathing Assistance: Limited assistance Feeding assistance: Limited assistance Dressing Assistance: Limited assistance     Functional Limitations Info    Sight Info: Adequate Hearing Info: Adequate Speech Info: Adequate    SPECIAL CARE FACTORS FREQUENCY  PT (By licensed PT), OT (By licensed OT)     PT Frequency: 5 x week OT Frequency: 5 x week            Contractures      Additional Factors Info  Code Status, Allergies Code Status Info: FULL Allergies Info: NKDA           Current Medications (01/02/2024):  This is the current hospital active medication list Current Facility-Administered Medications  Medication Dose Route Frequency Provider Last Rate Last Admin   acetaminophen  (TYLENOL ) tablet 650 mg  650 mg Oral Q6H PRN Basaraba, Iulia, MD   650 mg at 01/01/24 0216   Or   acetaminophen  (TYLENOL ) suppository 650 mg  650 mg Rectal Q6H PRN Avi Body, MD       cefTRIAXone  (ROCEPHIN ) 2 g in sodium chloride  0.9 % 100 mL IVPB  2 g Intravenous Q24H Avi Body, MD 200 mL/hr at 01/01/24 1745 2 g at 01/01/24 1745   folic acid  (FOLVITE ) tablet 1 mg  1 mg Oral Daily Avi Body, MD   1 mg at 01/02/24 0930   linezolid  (ZYVOX ) tablet 600 mg  600 mg Oral Q12H Zeigler, Dustin G, RPH   600 mg at 01/02/24 0931   LORazepam  (ATIVAN ) tablet 1-4 mg  1-4 mg Oral Q1H PRN Sheril Dines, MD   2 mg at 01/02/24 0930   Or   LORazepam  (ATIVAN ) injection 1-4 mg  1-4 mg Intravenous Q1H PRN Sheril Dines, MD    1 mg at 01/01/24 2246   metroNIDAZOLE  (FLAGYL ) tablet 500 mg  500 mg Oral Q12H Zeigler, Dustin G, RPH   500 mg at 01/02/24 0930   multivitamin with minerals tablet 1 tablet  1 tablet Oral Daily Avi Body, MD   1 tablet at 01/02/24 0930   ondansetron  (ZOFRAN ) tablet 4 mg  4 mg Oral Q6H PRN Avi Body, MD       Or   ondansetron  (ZOFRAN ) injection 4 mg  4 mg Intravenous Q6H PRN Avi Body, MD       oxyCODONE  (Oxy IR/ROXICODONE ) immediate release tablet 10 mg  10 mg Oral Q4H PRN Duncan, Hazel V, MD   10 mg at 01/02/24 0930   polyethylene glycol (MIRALAX  / GLYCOLAX ) packet 17 g  17 g Oral Daily Madelynn Schilder, RPH   17 g at 01/01/24 2201   senna-docusate (Senokot-S) tablet 1 tablet  1 tablet Oral QHS Madelynn Schilder, RPH   1 tablet at 01/01/24 2201   sodium chloride  flush (NS) 0.9 % injection 3 mL  3 mL Intravenous Q12H Avi Body, MD   3 mL at 01/02/24 0931   thiamine  (VITAMIN B1) tablet 100 mg  100 mg Oral Daily Avi Body, MD   100 mg at 01/02/24 0930   Or   thiamine  (VITAMIN B1) injection 100 mg  100 mg Intravenous Daily Avi Body, MD         Discharge Medications: Please see discharge summary for a list of discharge medications.  Relevant Imaging Results:  Relevant Lab Results:   Additional Information [id number]  Elsie Halo, RN

## 2024-01-02 NOTE — TOC Progression Note (Signed)
 Transition of Care [facility]) - Progression Note    Patient Details  Name: FARHAD CRINER MRN: [id number] Date of Birth: [birth year]  [facility] CM/SW Contact  Elsie Halo, RN Phone Number: 01/02/2024, 10:45 AM  Clinical Narrative:     Patient is scheduled for transmetatarsal amputation of L foot. Patient will be non-weight bearing to L foot post operatively. FL 2 initiated and sent out.  TOC will continue to follow.    Barriers to Discharge: Continued Medical Work up  Expected Discharge Plan and Services   Discharge Planning Services: CM Consult                                           Social Determinants of Health (SDOH) Interventions SDOH Screenings   Food Insecurity: Food Insecurity Present (12/27/2023)  Housing: High Risk (12/27/2023)  Transportation Needs: Unmet Transportation Needs (12/27/2023)  Utilities: At Risk (12/27/2023)  Financial Resource Strain: High Risk (03/22/2022)   Received from [facility], [facility]  Social Connections: Patient Declined (12/27/2023)  Tobacco Use: High Risk (12/29/2023)    Readmission Risk Interventions     No data to display

## 2024-01-02 NOTE — Progress Notes (Signed)
 Progress Note    Derrick Blanchard  YNW:[id number] DOB: 05-06-1981  DOA: 12/27/2023 PCP: Pcp, No      Brief Narrative:    Medical records reviewed and are as summarized below:  Derrick Blanchard is a 43 y.o. 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  About 3 days prior to admission, he was walking his dog when he felt a pop sensation and noticed that the wound on his left foot had opened up.  He noticed redness, swelling, increasing pain and foul-smelling drainage from the wound on the foot and left big toe.  He also complained of shakiness and was concerned about alcohol withdrawal.      Assessment/Plan:   Principal Problem:   Infection of toe as complication of amputation (HCC) Active Problems:   Alcohol use disorder, severe, dependence (HCC)   Alcoholic cirrhosis of liver (HCC)   Pancytopenia (HCC)   Hematochezia   Body mass index is 33.19 kg/m.  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 He is at risk for wound dehiscence and need for TMA Wound culture showed Enterococcus faecium which is resistant to amoxicillin  and vancomycin . No pathology report yet Strict nonweightbearing left lower extremity, postop shoe for transfers only per podiatrist.   Patient continues to ambulate on both feet.  The importance of strict nonweightbearing on the left foot was reiterated. Follow-up with podiatrist.  Plan for left transmetatarsal amputation tomorrow.  Recent I&D of left foot wound on  12/16/2023  He had fever on 12/30/2023.  No growth on blood cultures thus far.  Continue IV ceftriaxone , oral Flagyl  and Zyvox .  Consider de-escalation of antibiotics after TMA.  Alcohol use disorder, severe, dependence (HCC) Patient has an extensive history of alcohol use disorder complicated by cirrhosis.  He notes that he used to drink half a gallon of liquor per day, but now with drinks several 40 ounces or up to 15 beers.  Completed Librium  on 01/01/2024 Use IV Ativan  as needed for anxiety/agitation Continue multivitamins and folic acid     Alcoholic cirrhosis of liver (HCC) Compensated    Pancytopenia (HCC) Cell lines are currently at baseline.  Monitor CBC    Hematochezia Resolved.  Patient endorses intermittent mild red blood on his stool.  Possibly due to to hemorrhoids. Hemoglobin down from 8.5-7.9.  Continue to monitor. No indication for blood transfusion at this time.    Hypokalemia, hypomagnesemia Improved. On electrolyte protocol per pharmacy    Diet Order             Diet NPO time specified Except for: Sips with Meds  Diet effective midnight           Diet regular Room service appropriate? Yes; Fluid consistency: Thin  Diet effective now                            Consultants: Podiatrist  Procedures: S/p partial first ray amputation left foot, partial resection of second metatarsal left foot, application of dissolvable antibiotic beads left foot and application dermal allograft 38 cm left  foot on 12/29/2023.    Medications:    folic acid   1 mg Oral Daily   linezolid   600 mg Oral Q12H   metroNIDAZOLE   500 mg Oral Q12H   multivitamin with minerals  1 tablet Oral Daily   polyethylene glycol  17 g Oral Daily   senna-docusate  1 tablet Oral QHS   sodium chloride  flush  3 mL Intravenous Q12H   thiamine   100 mg Oral Daily   Or   thiamine   100 mg Intravenous Daily   Continuous Infusions:  cefTRIAXone  (ROCEPHIN )  IV 2 g (01/01/24 1745)      Anti-infectives (From admission, onward)    Start     Dose/Rate Route Frequency Ordered Stop   01/01/24 1000  metroNIDAZOLE  (FLAGYL ) tablet 500 mg        500 mg Oral Every 12 hours 01/01/24 0849     01/01/24 1000  linezolid  (ZYVOX ) tablet 600 mg        600 mg Oral Every 12 hours 01/01/24 0849     12/29/23 1253  vancomycin  (VANCOCIN ) powder  Status:  Discontinued          As needed 12/29/23 1309 12/29/23 1311   12/29/23 1253  tobramycin  (NEBCIN ) injection  Status:  Discontinued          As needed 12/29/23 1317 12/29/23 1317   12/27/23 2200  linezolid  (ZYVOX ) IVPB 600 mg  Status:  Discontinued        600 mg 300 mL/hr over 60 Minutes Intravenous Every 12 hours 12/27/23 1319 01/01/24 0849   12/27/23 2200  metroNIDAZOLE  (FLAGYL ) IVPB 500 mg  Status:  Discontinued        500 mg 100 mL/hr over 60 Minutes Intravenous Every 12 hours 12/27/23 1321 01/01/24 0849   12/27/23 1800  cefTRIAXone  (ROCEPHIN ) 2 g in sodium chloride  0.9 % 100 mL IVPB        2 g 200 mL/hr over 30 Minutes Intravenous Every 24 hours 12/27/23 1321     12/27/23 1200  Ampicillin -Sulbactam (UNASYN ) 3 g in sodium chloride  0.9 % 100 mL IVPB       Placed in "And" Linked Group   3 g 200 mL/hr over 30 Minutes Intravenous  Once 12/27/23 1152 12/27/23 1300   12/27/23 1200  linezolid  (ZYVOX ) IVPB 600 mg       Placed in "And" Linked Group   600 mg 300 mL/hr over 60 Minutes Intravenous  Once 12/27/23 1152 12/27/23 1328              Family Communication/Anticipated D/C date and plan/Code Status   DVT prophylaxis: SCDs Start: 12/27/23 1307     Code Status: Full Code  Family Communication: None Disposition Plan: Plan to discharge home   Status is: Inpatient Remains inpatient appropriate because: Left foot infection       Subjective:   Interval events noted. He was focused on his Ativan  and pain meds this morning. He insisted that I look at the last time he had his oxycodone  and write it on the board. I  told him he had it at 9:30 AM this morning. He then calculated that it means he will get the next dose at 1:30 PM. He also asked about his Ativan  dose and correctly calculated the time for the next dose. He proceeded to ask me orientation questions because he said he knew I was going to ask him those questions. He asked me what year it is and who the president is? He  laughed and said "I was just joking with you".   Objective:    Vitals:   01/01/24 2010 01/01/24 2241 01/02/24 0528 01/02/24 0845  BP:  116/83 112/72 102/62  Pulse: (!) 103 (!) 103 97 91  Resp:   18 19  Temp:   98.3 F (36.8 C) 98 F (36.7 C)  TempSrc:   Oral   SpO2:   98% 100%  Weight:      Height:       No data found.   Intake/Output Summary (Last 24 hours) at 01/02/2024 1052 Last data filed at 01/01/2024 1300 Gross per 24 hour  Intake 240 ml  Output --  Net 240 ml   Filed Weights   12/27/23 1110 12/29/23 1146  Weight: 99.8 kg 99 kg    Exam:  GEN: NAD SKIN: Warm and dry EYES: No pallor or icterus ENT: MMM CV: RRR PULM: CTA B ABD: soft, ND, NT, +BS CNS: AAO x 3, non focal EXT: Dressing on left foot surgical wound is stained with blood         Data Reviewed:   I have personally reviewed following labs and imaging studies:  Labs: Labs show the following:   Basic Metabolic Panel: Recent Labs  Lab 12/29/23 0614 12/30/23 0459 12/31/23 0427 01/01/24 0523 01/02/24 0341  NA 135 132* 132* 135 137  K 3.6 3.3* 3.5 3.5 3.9  CL 108 107 103 105 108  CO2 23 21* 24 24 24   GLUCOSE 106* 110* 117* 101* 103*  BUN 8 6 6 10 10   CREATININE 0.60* 0.58* 0.64 0.71 0.57*  CALCIUM  8.1* 7.8* 7.8* 7.8* 8.3*  MG 1.6* 1.4* 1.6* 2.0 1.7  PHOS 3.3 2.7 2.3* 3.3 3.5   GFR Estimated Creatinine Clearance: 139.9 mL/min (A) (by C-G formula based on SCr of 0.57 mg/dL (L)). Liver Function Tests: Recent Labs  Lab 12/27/23 1114 12/28/23 0619 01/02/24 0341  AST 60* 42*  --   ALT 26 21  --   ALKPHOS 98 81  --    BILITOT 2.0* 1.8*  --   PROT 6.4* 5.5*  --   ALBUMIN 3.1* 2.5* 2.8*   No results for input(s): "LIPASE", "AMYLASE" in the last 168 hours. No results for input(s): "AMMONIA" in the last 168 hours. Coagulation profile No results for input(s): "INR", "PROTIME" in the last 168 hours.  CBC: Recent Labs  Lab 12/27/23 1114 12/28/23 0619 12/29/23 0614 12/30/23 0459 01/01/24 0523 01/02/24 0341  WBC 2.7* 1.3* 1.5* 4.1 3.0* 1.8*  NEUTROABS 1.9  --  0.9* 3.1  --   --   HGB 9.3* 8.2* 8.3* 8.4* 8.5* 7.9*  HCT 29.4* 25.9* 26.2* 25.9* 27.2* 25.0*  MCV 81.9 81.2 82.1 81.2 83.2 83.3  PLT 46* 37* 44* 49* 65* 57*   Cardiac Enzymes: No results for input(s): "CKTOTAL", "CKMB", "CKMBINDEX", "TROPONINI" in the last 168 hours. BNP (last 3 results) No results for input(s): "PROBNP" in the last 8760 hours. CBG: Recent Labs  Lab 12/30/23 0846 12/30/23 1208  GLUCAP 112* 117*   D-Dimer: No results for input(s): "DDIMER" in the last 72 hours. Hgb A1c: No results for input(s): "HGBA1C" in the last 72 hours. Lipid Profile: No results for input(s): "CHOL", "HDL", "LDLCALC", "TRIG", "CHOLHDL", "LDLDIRECT" in the last 72 hours. Thyroid function studies: No results for input(s): "TSH", "T4TOTAL", "T3FREE", "THYROIDAB" in the last 72 hours.  Invalid input(s): "FREET3" Anemia work up: No results for input(s): "VITAMINB12", "FOLATE", "FERRITIN", "TIBC", "IRON", "RETICCTPCT" in the last 72 hours.  Sepsis Labs: Recent Labs  Lab 12/27/23 1114 12/27/23 1424 12/28/23 0619 12/29/23 0614 12/30/23 0459 01/01/24 0523 01/02/24 0341  WBC 2.7*  --    < > 1.5* 4.1 3.0* 1.8*  LATICACIDVEN 1.4 1.9  --   --   --   --   --    < > = values in this interval not displayed.    Microbiology Recent Results (from the past 240 hours)  Aerobic/Anaerobic Culture w Gram Stain (surgical/deep wound)     Status: None (Preliminary result)   Collection Time: 12/29/23 12:41 PM   Specimen: Wound; Tissue  Result Value Ref  Range Status   Specimen Description   Final    TISSUE BONE LEFT FOOT Performed at [facility], [address] [place], [place] [address]    Special Requests   Final    NONE Performed at [facility], [address] [place], [place] [address]    Gram Stain   Final    NO WBC SEEN RARE GRAM POSITIVE COCCI IN PAIRS IN SINGLES Performed at [facility], [address], [place], [place] [address]    Culture   Final    RARE ENTEROCOCCUS FAECIUM VANCOMYCIN  RESISTANT ENTEROCOCCUS ISOLATED NO ANAEROBES ISOLATED; CULTURE IN PROGRESS FOR 5 DAYS    Report Status PENDING  Incomplete   Organism ID, Bacteria ENTEROCOCCUS FAECIUM  Final      Susceptibility   Enterococcus faecium - MIC*    AMPICILLIN  >=32 RESISTANT Resistant     VANCOMYCIN  >=32 RESISTANT Resistant     GENTAMICIN SYNERGY SENSITIVE Sensitive     LINEZOLID  2 SENSITIVE Sensitive     * RARE ENTEROCOCCUS FAECIUM  Culture, blood (Routine X 2) w Reflex to ID Panel     Status: None (Preliminary result)   Collection Time: 12/30/23  8:08 AM   Specimen: BLOOD  Result Value Ref Range Status   Specimen Description BLOOD RAC  Final   Special Requests   Final    BOTTLES DRAWN AEROBIC AND ANAEROBIC Blood Culture adequate volume   Culture   Final    NO GROWTH 3 DAYS Performed at [facility], [address], [place], [place] [address]    Report Status PENDING  Incomplete  Culture, blood (Routine X 2) w Reflex to ID Panel     Status: None (Preliminary result)   Collection Time: 12/30/23  8:08 AM   Specimen: BLOOD  Result Value Ref Range Status   Specimen Description BLOOD [facility]  Final   Special Requests   Final    BOTTLES DRAWN AEROBIC AND ANAEROBIC Blood Culture adequate volume   Culture   Final    NO GROWTH 3 DAYS Performed at [facility], [address] [place], [place] [address]    Report Status PENDING  Incomplete    Procedures and diagnostic studies:  No results  found.              LOS: 6 days   Kayloni Rocco  [facility]   Pager on [email or web address]. If 7PM-7AM, please contact night-coverage at [email or web address]     01/02/2024, 10:52 AM

## 2024-01-02 NOTE — Progress Notes (Signed)
 PT Cancellation Note  Patient Details Name: Derrick Blanchard MRN: [id number] DOB: Apr 28, 1981   Cancelled Treatment:    Reason Eval/Treat Not Completed: Patient's level of consciousness. Chart reviewed, RN consulted. Pt remains confused, medically noncompliant regarding his weightbearing status, seen by podiatry today again earlier and reminded of NWB status. Pt found standing in hallway with shoes on, just hanging out. Pt does not appear alert nor coherent. I do not believe PT intervention will be successful at this point in time given pt's capacity to focus or follow commands. Will continue to follow, reevaluate once appropriate.   1:45 PM, 01/02/24 Dawn Eth, PT, DPT Physical Therapist - [facility]  [phone] (ASCOM)     Kamsiyochukwu Buist C 01/02/2024, 1:43 PM

## 2024-01-02 NOTE — Progress Notes (Signed)
 OT Cancellation Note  Patient Details Name: Derrick Blanchard MRN: [id number] DOB: [birth year]   Cancelled Treatment:    Reason Eval/Treat Not Completed: Patient not medically ready. Chart reviewed. Pt continues to be non-compliant with WB status and is scheduled for LLE transmetatarsal amputation 01/03/2024. Discussed with PT - pt confused and is not demonstrating appropriate level of attention for participation in therapeutic intervention. OT will continue to follow and re-evaluate when medically appropriate.   Timisha Mondry L. Fillmore Bynum, OTR/L  01/02/24, 3:31 PM

## 2024-01-02 NOTE — Progress Notes (Signed)
 Subjective:  Patient ID: Derrick Blanchard, male    DOB: November 14, 1980,  MRN: [id number]  DOS: 12/29/23  Proc: 1.  Partial first ray amputation, left foot 2.  Partial resection of second metatarsal, left foot 3.  Application dissolvable antibiotic beads, left foot 4.  Application dermal allograft 38 cm, left foot  Patient s/p left partial ray amputation and partial second metatarsal resection with application of allograft and antibiotic beads.  Pt seen and discussed plans for OR tomorrow. He is in agreement to proceed. Discussed he should not be putting any weight down on his left foot he is aware but seems not to care. Wanted dressing changed bc he says its uncomfortable due to saturation which was caused by his excess ambulation.   I discussed he is at high risk for limb loss if he continues to walk around on his left foot.  Past Medical History:  Diagnosis Date   Anemia    Anxiety    Cirrhosis (HCC)    Depression    Heart murmur    Hepatitis    Hypertension    Substance abuse [facility])      Past Surgical History:  Procedure Laterality Date   AMPUTATION Left 12/29/2023   Procedure: AMPUTATION, FOOT, RAY;  Surgeon: Evertt Hoe, DPM;  Location: [facility] ORS;  Service: Orthopedics/Podiatry;  Laterality: Left;  Partial first ray amp, abx beads, graft application   COLONOSCOPY     ESOPHAGOGASTRODUODENOSCOPY (EGD) WITH PROPOFOL  N/A 02/03/2015   Procedure: ESOPHAGOGASTRODUODENOSCOPY (EGD) WITH PROPOFOL ;  Surgeon: Luella Sager, MD;  Location: [facility] ENDOSCOPY;  Service: Endoscopy;  Laterality: N/A;   ESOPHAGOGASTRODUODENOSCOPY (EGD) WITH PROPOFOL  N/A 12/21/2017   Procedure: ESOPHAGOGASTRODUODENOSCOPY (EGD) WITH PROPOFOL ;  Surgeon: Luke Salaam, MD;  Location: [facility] ENDOSCOPY;  Service: Gastroenterology;  Laterality: N/A;   FISSURECTOMY     IRRIGATION AND DEBRIDEMENT FOOT Left 12/16/2023   Procedure: IRRIGATION AND DEBRIDEMENT FOOT, possible sesamoidectomy, bone biopsy;  Surgeon:  Reina Cara, DPM;  Location: [facility] ORS;  Service: Podiatry;  Laterality: Left;  Pulse lavage, bone biopsy with jam shidi needle, mini c arm, sagittal saw available       Latest Ref Rng & Units 01/02/2024    3:41 AM 01/01/2024    5:23 AM 12/30/2023    4:59 AM  CBC  WBC 4.0 - 10.5 K/uL 1.8  3.0  4.1   Hemoglobin 13.0 - 17.0 g/dL 7.9  8.5  8.4   Hematocrit 39.0 - 52.0 % 25.0  27.2  25.9   Platelets 150 - 400 K/uL 57  65  49        Latest Ref Rng & Units 01/02/2024    3:41 AM 01/01/2024    5:23 AM 12/31/2023    4:27 AM  BMP  Glucose 70 - 99 mg/dL 865  784  696   BUN 6 - 20 mg/dL 10  10  6    Creatinine 0.61 - 1.24 mg/dL 2.95  2.84  1.32   Sodium 135 - 145 mmol/L 137  135  132   Potassium 3.5 - 5.1 mmol/L 3.9  3.5  3.5   Chloride 98 - 111 mmol/L 108  105  103   CO2 22 - 32 mmol/L 24  24  24    Calcium  8.9 - 10.3 mg/dL 8.3  7.8  7.8      Objective:   Vitals:   01/02/24 0528 01/02/24 0845  BP: 112/72 102/62  Pulse: 97 91  Resp: 18 19  Temp: 98.3 F (36.8 C)  98 F (36.7 C)  SpO2: 98% 100%    General:AA&O x 3. Normal mood and affect   Vascular: DP and PT pulses 2/4 bilateral. Brisk capillary refill to all digits. Pedal hair present   Neruological. Epicritic sensation grossly intact.   Derm: incision intact no signs of dehiscence though has had oozing from incision. Increased duskiness and necrotic changes to distal incision as well as maceration of the surgical site increased from prior. Significant edema  MSK: MMT 5/5 in dorsiflexion, plantar flexion, inversion and eversion. Normal joint ROM without pain or crepitus.     Assessment & Plan:  Patient was evaluated and treated and all questions answered.  DX: s/p left partial firstt ray and partial resection second metatarsal on left foot   POD # 4 s/p above procedure - Evident pt has continued to ambulate agaisnt recommendations. He will likely be non compliant following trans metarsal amputation. If he dehisces his  amputation site following tomorrows procedure he will be high risk for needing BKA. -NPO past MN Tuesday Night for OR Wednesday for Transmetatarsal amputation -HOLD ANTICOAGULATION 24 HRS PRE OPERATIVE - ok to resume 24 hrs after surgery -WB Status: Strict non weightbearing to LLE -Medications/ABX: Continue broad spectrum abx -Foot redressed. Leave intact until surgery, reinforce as needed for strikethrough.   Evertt Hoe, DPM  Accessible via secure chat for questions or concerns.

## 2024-01-02 NOTE — Plan of Care (Signed)

## 2024-01-03 ENCOUNTER — Other Ambulatory Visit: Payer: Self-pay

## 2024-01-03 ENCOUNTER — Inpatient Hospital Stay

## 2024-01-03 ENCOUNTER — Encounter: Payer: Self-pay | Admitting: Internal Medicine

## 2024-01-03 ENCOUNTER — Encounter
Admission: EM | Disposition: A | Discharge status: Home / Self Care | Payer: Self-pay | Source: Home / Self Care | Attending: Internal Medicine

## 2024-01-03 ENCOUNTER — Inpatient Hospital Stay: Admit: 2024-01-03 | Admitting: Podiatry

## 2024-01-03 DIAGNOSIS — T8781 Dehiscence of amputation stump: Secondary | ICD-10-CM

## 2024-01-03 DIAGNOSIS — F1020 Alcohol dependence, uncomplicated: Secondary | ICD-10-CM | POA: Diagnosis not present

## 2024-01-03 DIAGNOSIS — K7030 Alcoholic cirrhosis of liver without ascites: Secondary | ICD-10-CM | POA: Diagnosis not present

## 2024-01-03 DIAGNOSIS — T8740 Infection of amputation stump, unspecified extremity: Secondary | ICD-10-CM | POA: Diagnosis not present

## 2024-01-03 DIAGNOSIS — D61818 Other pancytopenia: Secondary | ICD-10-CM | POA: Diagnosis not present

## 2024-01-03 HISTORY — PX: TRANSMETATARSAL AMPUTATION: SHX6197

## 2024-01-03 LAB — CBC
HCT: 23.8 % — ABNORMAL LOW (ref 39.0–52.0)
Hemoglobin: 7.7 g/dL — ABNORMAL LOW (ref 13.0–17.0)
MCH: 26.2 pg (ref 26.0–34.0)
MCHC: 32.4 g/dL (ref 30.0–36.0)
MCV: 81 fL (ref 80.0–100.0)
Platelets: 53 10*3/uL — ABNORMAL LOW (ref 150–400)
RBC: 2.94 MIL/uL — ABNORMAL LOW (ref 4.22–5.81)
RDW: 19.1 % — ABNORMAL HIGH (ref 11.5–15.5)
WBC: 1.7 10*3/uL — ABNORMAL LOW (ref 4.0–10.5)
nRBC: 0 % (ref 0.0–0.2)

## 2024-01-03 LAB — RENAL FUNCTION PANEL
Albumin: 2.4 g/dL — ABNORMAL LOW (ref 3.5–5.0)
Anion gap: 9 (ref 5–15)
BUN: 10 mg/dL (ref 6–20)
CO2: 20 mmol/L — ABNORMAL LOW (ref 22–32)
Calcium: 7.9 mg/dL — ABNORMAL LOW (ref 8.9–10.3)
Chloride: 106 mmol/L (ref 98–111)
Creatinine, Ser: 0.67 mg/dL (ref 0.61–1.24)
GFR, Estimated: >60 mL/min (ref >60)
Glucose, Bld: 101 mg/dL — ABNORMAL HIGH (ref 70–99)
Phosphorus: 3.2 mg/dL (ref 2.5–4.6)
Potassium: 3.7 mmol/L (ref 3.5–5.1)
Sodium: 135 mmol/L (ref 135–145)

## 2024-01-03 LAB — AEROBIC/ANAEROBIC CULTURE W GRAM STAIN (SURGICAL/DEEP WOUND): Gram Stain: NONE SEEN

## 2024-01-03 LAB — MAGNESIUM: Magnesium: 1.7 mg/dL (ref 1.7–2.4)

## 2024-01-03 SURGERY — AMPUTATION, FOOT, TRANSMETATARSAL
Anesthesia: General | Site: Toe | Laterality: Left

## 2024-01-03 MED ORDER — FENTANYL CITRATE (PF) 100 MCG/2ML IJ SOLN
INTRAMUSCULAR | Status: AC
  Filled 2024-01-03: qty 2

## 2024-01-03 MED ORDER — TRANEXAMIC ACID-NACL 1000-0.7 MG/100ML-% IV SOLN
INTRAVENOUS | Status: DC | PRN
  Administered 2024-01-03: 1000 mg via INTRAVENOUS

## 2024-01-03 MED ORDER — OXYCODONE HCL 5 MG PO TABS
5.0000 mg | ORAL_TABLET | Freq: Once | ORAL | Status: AC | PRN
  Administered 2024-01-03: 5 mg via ORAL

## 2024-01-03 MED ORDER — OXYCODONE HCL 5 MG PO TABS
ORAL_TABLET | ORAL | Status: AC
  Filled 2024-01-03: qty 1

## 2024-01-03 MED ORDER — PROPOFOL 1000 MG/100ML IV EMUL
INTRAVENOUS | Status: AC
Start: 2024-01-03 — End: ?
  Filled 2024-01-03: qty 100

## 2024-01-03 MED ORDER — FENTANYL CITRATE (PF) 100 MCG/2ML IJ SOLN: 25.0000 ug | INTRAMUSCULAR | Status: DC | PRN

## 2024-01-03 MED ORDER — BUPIVACAINE HCL (PF) 0.5 % IJ SOLN
INTRAMUSCULAR | Status: AC
  Filled 2024-01-03: qty 30

## 2024-01-03 MED ORDER — FENTANYL CITRATE (PF) 100 MCG/2ML IJ SOLN
INTRAMUSCULAR | Status: DC | PRN
  Administered 2024-01-03: 25 ug via INTRAVENOUS

## 2024-01-03 MED ORDER — HYDROMORPHONE HCL 1 MG/ML IJ SOLN
0.5000 mg | INTRAMUSCULAR | Status: DC | PRN
  Administered 2024-01-03 – 2024-01-04 (×3): 0.5 mg via INTRAVENOUS
  Filled 2024-01-03 (×3): qty 0.5

## 2024-01-03 MED ORDER — 0.9 % SODIUM CHLORIDE (POUR BTL) OPTIME
TOPICAL | Status: DC | PRN
  Administered 2024-01-03: 1000 mL

## 2024-01-03 MED ORDER — MAGNESIUM SULFATE 2 GM/50ML IV SOLN
2.0000 g | Freq: Once | INTRAVENOUS | Status: AC
  Administered 2024-01-03: 2 g via INTRAVENOUS
  Filled 2024-01-03: qty 50

## 2024-01-03 MED ORDER — DEXMEDETOMIDINE HCL IN NACL 80 MCG/20ML IV SOLN
INTRAVENOUS | Status: AC
  Filled 2024-01-03: qty 20

## 2024-01-03 MED ORDER — LIDOCAINE HCL (CARDIAC) PF 100 MG/5ML IV SOSY
PREFILLED_SYRINGE | INTRAVENOUS | Status: DC | PRN
  Administered 2024-01-03: 100 mg via INTRAVENOUS

## 2024-01-03 MED ORDER — PROPOFOL 500 MG/50ML IV EMUL
INTRAVENOUS | Status: DC | PRN
  Administered 2024-01-03: 200 mg via INTRAVENOUS

## 2024-01-03 MED ORDER — SEVOFLURANE IN SOLN
RESPIRATORY_TRACT | Status: AC
Start: 2024-01-03 — End: ?
  Filled 2024-01-03: qty 250

## 2024-01-03 MED ORDER — PHENYLEPHRINE 80 MCG/ML (10ML) SYRINGE FOR IV PUSH (FOR BLOOD PRESSURE SUPPORT)
PREFILLED_SYRINGE | INTRAVENOUS | Status: AC
  Filled 2024-01-03: qty 10

## 2024-01-03 MED ORDER — MIDAZOLAM HCL 2 MG/2ML IJ SOLN
INTRAMUSCULAR | Status: AC
  Filled 2024-01-03: qty 2

## 2024-01-03 MED ORDER — MORPHINE SULFATE (PF) 2 MG/ML IV SOLN
2.0000 mg | INTRAVENOUS | Status: DC | PRN
  Administered 2024-01-03: 2 mg via INTRAVENOUS
  Filled 2024-01-03: qty 1

## 2024-01-03 MED ORDER — PHENYLEPHRINE 80 MCG/ML (10ML) SYRINGE FOR IV PUSH (FOR BLOOD PRESSURE SUPPORT)
PREFILLED_SYRINGE | INTRAVENOUS | Status: DC | PRN
  Administered 2024-01-03 (×3): 80 ug via INTRAVENOUS

## 2024-01-03 MED ORDER — OXYCODONE HCL 5 MG/5ML PO SOLN: 5.0000 mg | Freq: Once | ORAL | Status: AC | PRN

## 2024-01-03 MED ORDER — TRANEXAMIC ACID-NACL 1000-0.7 MG/100ML-% IV SOLN
INTRAVENOUS | Status: AC
  Filled 2024-01-03: qty 100

## 2024-01-03 MED ORDER — LACTATED RINGERS IV SOLN: INTRAVENOUS | Status: DC | PRN

## 2024-01-03 MED ORDER — ONDANSETRON HCL 4 MG/2ML IJ SOLN
INTRAMUSCULAR | Status: AC
  Filled 2024-01-03: qty 2

## 2024-01-03 MED ORDER — MIDAZOLAM HCL 2 MG/2ML IJ SOLN
INTRAMUSCULAR | Status: DC | PRN
  Administered 2024-01-03: 2 mg via INTRAVENOUS

## 2024-01-03 MED ORDER — LIDOCAINE HCL (PF) 1 % IJ SOLN
INTRAMUSCULAR | Status: AC
  Filled 2024-01-03: qty 30

## 2024-01-03 MED ORDER — DEXAMETHASONE SODIUM PHOSPHATE 10 MG/ML IJ SOLN
INTRAMUSCULAR | Status: DC | PRN
  Administered 2024-01-03: 8 mg via INTRAVENOUS

## 2024-01-03 MED ORDER — ONDANSETRON HCL 4 MG/2ML IJ SOLN
INTRAMUSCULAR | Status: DC | PRN
  Administered 2024-01-03: 4 mg via INTRAVENOUS

## 2024-01-03 MED ORDER — BUPIVACAINE HCL 0.5 % IJ SOLN
INTRAMUSCULAR | Status: DC | PRN
  Administered 2024-01-03: 30 mL

## 2024-01-03 MED ORDER — PROPOFOL 10 MG/ML IV BOLUS
INTRAVENOUS | Status: AC
  Filled 2024-01-03: qty 40

## 2024-01-03 SURGICAL SUPPLY — 49 items
BLADE MED AGGRESSIVE (BLADE) ×1 IMPLANT
BLADE OSC/SAGITTAL MD 5.5X18 (BLADE) IMPLANT
BLADE SURG 15 STRL LF DISP TIS (BLADE) ×2 IMPLANT
BLADE SURG MINI STRL (BLADE) ×1 IMPLANT
BNDG ELASTIC 4INX 5YD STR LF (GAUZE/BANDAGES/DRESSINGS) ×1 IMPLANT
BNDG ESMARCH 4X12 STRL LF (GAUZE/BANDAGES/DRESSINGS) ×1 IMPLANT
BNDG GAUZE DERMACEA FLUFF 4 (GAUZE/BANDAGES/DRESSINGS) ×1 IMPLANT
BOOT STEPPER DURA LG (SOFTGOODS) IMPLANT
CNTNR URN SCR LID CUP LEK RST (MISCELLANEOUS) IMPLANT
CUFF TOURN SGL QUICK 12 (TOURNIQUET CUFF) IMPLANT
CUFF TOURN SGL QUICK 18X4 (TOURNIQUET CUFF) IMPLANT
DRAPE FLUOR MINI C-ARM 54X84 (DRAPES) IMPLANT
DRSG EMULSION OIL 3X3 NADH (GAUZE/BANDAGES/DRESSINGS) IMPLANT
DRSG EMULSION OIL 3X8 NADH (GAUZE/BANDAGES/DRESSINGS) IMPLANT
DRSG TELFA 3X8 NADH STRL (GAUZE/BANDAGES/DRESSINGS) ×1 IMPLANT
DURAPREP 26ML APPLICATOR (WOUND CARE) ×1 IMPLANT
ELECTRODE REM PT RTRN 9FT ADLT (ELECTROSURGICAL) ×1 IMPLANT
GAUZE PAD ABD 8X10 STRL (GAUZE/BANDAGES/DRESSINGS) ×1 IMPLANT
GAUZE SPONGE 4X4 12PLY STRL (GAUZE/BANDAGES/DRESSINGS) ×2 IMPLANT
GAUZE STRETCH 2X75IN STRL (MISCELLANEOUS) IMPLANT
GAUZE XEROFORM 1X8 LF (GAUZE/BANDAGES/DRESSINGS) ×1 IMPLANT
GLOVE BIOGEL PI IND STRL 7.5 (GLOVE) ×1 IMPLANT
GLOVE SURG SYN 7.5 E (GLOVE) ×1 IMPLANT
GLOVE SURG SYN 7.5 PF PI (GLOVE) ×1 IMPLANT
GOWN STRL REUS W/ TWL LRG LVL3 (GOWN DISPOSABLE) IMPLANT
GOWN STRL REUS W/ TWL XL LVL3 (GOWN DISPOSABLE) ×1 IMPLANT
HANDPIECE VERSAJET DEBRIDEMENT (MISCELLANEOUS) IMPLANT
KIT TURNOVER KIT A (KITS) ×1 IMPLANT
LABEL OR SOLS (LABEL) ×1 IMPLANT
MANIFOLD NEPTUNE II (INSTRUMENTS) ×1 IMPLANT
NDL FILTER BLUNT 18X1 1/2 (NEEDLE) ×1 IMPLANT
NDL HYPO 25X1 1.5 SAFETY (NEEDLE) ×2 IMPLANT
NEEDLE FILTER BLUNT 18X1 1/2 (NEEDLE) IMPLANT
NEEDLE HYPO 25X1 1.5 SAFETY (NEEDLE) ×1 IMPLANT
NS IRRIG 500ML POUR BTL (IV SOLUTION) ×1 IMPLANT
PACK EXTREMITY ARMC (MISCELLANEOUS) ×1 IMPLANT
PAD ABD DERMACEA PRESS 5X9 (GAUZE/BANDAGES/DRESSINGS) ×1 IMPLANT
PAD PREP OB/GYN DISP 24X41 (PERSONAL CARE ITEMS) ×1 IMPLANT
SOL .9 NS 3000ML IRR UROMATIC (IV SOLUTION) IMPLANT
SOLUTION PREP PVP 2OZ (MISCELLANEOUS) ×1 IMPLANT
STAPLER SKIN PROX 35W (STAPLE) ×1 IMPLANT
SUT MNCRL AB 3-0 PS2 27 (SUTURE) ×1 IMPLANT
SUT PROLENE 3 0 PS 2 (SUTURE) ×1 IMPLANT
SWAB CULTURE AMIES ANAERIB BLU (MISCELLANEOUS) IMPLANT
SYR 10ML LL (SYRINGE) ×1 IMPLANT
TIP BRUSH PULSAVAC PLUS 24.33 (MISCELLANEOUS) IMPLANT
TIP FAN IRRIG PULSAVAC PLUS (DISPOSABLE) IMPLANT
TRAP FLUID SMOKE EVACUATOR (MISCELLANEOUS) ×1 IMPLANT
WATER STERILE IRR 500ML POUR (IV SOLUTION) ×1 IMPLANT

## 2024-01-03 NOTE — Transfer of Care (Signed)
 Immediate Anesthesia Transfer of Care Note  Patient: Derrick Blanchard  Procedure(s) Performed: AMPUTATION, FOOT, TRANSMETATARSAL (Left: Toe)  Patient Location: PACU  Anesthesia Type:General  Level of Consciousness: drowsy  Airway & Oxygen Therapy: Patient Spontanous Breathing and Patient connected to face mask oxygen  Post-op Assessment: Report given to RN and Post -op Vital signs reviewed and stable  Post vital signs: Reviewed and stable  Last Vitals:  Vitals Value Taken Time  BP 105/56 01/03/24 0831  Temp 35.8 0831  Pulse 91 01/03/24 0834  Resp 22 01/03/24 0834  SpO2 98 % 01/03/24 0834  Vitals shown include unfiled device data.  Last Pain:  Vitals:   01/03/24 0655  TempSrc: Temporal  PainSc: 9       Patients Stated Pain Goal: 5 (01/03/24 0655)  Complications: No notable events documented.

## 2024-01-03 NOTE — Progress Notes (Signed)
 History and Physical Interval Note:  01/03/2024 7:18 AM  Vernelle Goldstein  has presented today for surgery, with the diagnosis of dehiscence / necrosis of surgical site due to non compliance with weightbearing status.  The various methods of treatment have been discussed with the patient and family. After consideration of risks, benefits and other options for treatment, the patient has consented to   Procedure(s) with comments: AMPUTATION, FOOT, TRANSMETATARSAL (Left) - Left foot Trans met amputation as a surgical intervention.  The patient's history has been reviewed, patient examined, no change in status, stable for surgery.  I have reviewed the patient's chart and labs.  Questions were answered to the patient's satisfaction.     Karlene Overcast Patrycja Mumpower

## 2024-01-03 NOTE — Progress Notes (Signed)
 PROGRESS NOTE    Derrick Blanchard  WUJ:[id number] DOB: [birth year] DOA: 12/27/2023 PCP: Pcp, No  Chief Complaint  Patient presents with   Wound Dehiscence    Hospital Course:  Is a 43 year old male with history of cirrhosis secondary to alcohol abuse, active alcohol use disorder, thrombocytopenia, hypertension, depression, who presented to the alcohol with wound dehiscence after recent amputation of the left second toe on 4/12.  He also endorsed concerns about alcohol withdrawal.  Patient was admitted.  Podiatry was consulted.  Patient underwent transmetatarsal amputation of the left foot on 4/30.  Subjective: Patient was evaluated after surgery this morning.  He is found trying to ambulate in his room.  He is requesting to take his boot off and look at his bandage.  I have discussed with him the importance of keeping the bandage on and resting postoperatively.  We discussed the physical therapy will assist with ambulation. Patient is requesting increased pain medication.  He reports Dilaudid  only.  Refuses morphine .   Objective: Vitals:   01/03/24 0845 01/03/24 0852 01/03/24 0901 01/03/24 0932  BP: 129/76 129/76 129/80 114/75  Pulse: 88 81 81 83  Resp: 18 18 14 18   Temp:   99 F (37.2 C) 98.3 F (36.8 C)  TempSrc:      SpO2: 97% 95% 96% 100%  Weight:      Height:        Intake/Output Summary (Last 24 hours) at 01/03/2024 1018 Last data filed at 01/03/2024 0826 Gross per 24 hour  Intake 400 ml  Output 5 ml  Net 395 ml   Filed Weights   12/27/23 1110 12/29/23 1146 01/03/24 0655  Weight: 99.8 kg 99 kg 99 kg    Examination: General exam: Restless, sitting on edge of bed, attempting to ambulate. Respiratory system: No work of breathing, symmetric chest wall expansion Cardiovascular system: S1 & S2 heard, RRR.  Gastrointestinal system: Abdomen is nondistended, soft and nontender.  Neuro: Alert and oriented. No focal neurological deficits. Extremities: Bruises in various  stages of healing, large circular bruises around chest, abdomen, back, arms. Left foot in Cam boot. Psychiatry: Demonstrates poor judgement and insight  Assessment & Plan:  Principal Problem:   Infection of toe as complication of amputation (HCC) Active Problems:   Alcohol use disorder, severe, dependence (HCC)   Alcoholic cirrhosis of liver (HCC)   Pancytopenia (HCC)   Hematochezia   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)    Infection of toes complication of amputation Left foot osteomyelitis Wound dehiscence - Status post partial first ray amputation of left foot and partial resection of second metatarsal left foot on 4/25. - Patient continued to ambulate despite recommendations. - 4/30 patient underwent left TMA.  We have reiterated the importance of strict nonweightbearing to left lower extremity.  He has been advised as to the risk of dehiscence and requiring a BKA if he continues to ambulate. - Wound culture growing Enterococcus resistant to amoxicillin  and vancomycin .  Has been on ceftriaxone , Flagyl , Zyvox .  Per podiatry we can expect clean surgical margins postoperatively.  Will proceed with linezolid  only now for 7 days. - Will follow for pathology report - Continue recommendations per podiatry postoperatively - Continue to follow blood cultures, no growth x 4 days - For now continue with ceftriaxone , Flagyl , Zyvox .  Will plan for de-escalation postoperatively per surgical results. - Continue to monitor fever curve  Alcohol use disorder, severe, dependence - Alcohol use varies.  Previously half a gallon of  liquor daily.  Now regularly 15 beers - Completed Librium  01/01/2024 - Continue with as needed Ativan  - Continue multivitamin  - Continue to follow closely  Alcoholic cirrhosis of the liver - Currently compensated - Continue to follow closely  Bruises, multiple skin lesions - Patient has bruises in multiple stages of healing all over his chest  and abdomen.  He reports that this is from "roughhousing with friends" prior to arrival. - Have educated patient on the importance of caution and behavior modifications given his thrombocytopenia and high risk for bleeds.  Pancytopenia - Secondary to cirrhosis as above - Trend CBC  Hematochezia - Intermittent mild red blood in stool.  Resolved now - Trend hemoglobin - No indication for transfusion or urgent GI evaluation at this time.  Hypokalemia Hypomagnesemia - Electrolyte replacements per protocol per pharmacy  DVT prophylaxis: SCDs   Code Status: Full Code Disposition: Currently inpatient.  Will require physical therapy postoperatively.  Monitor fever curve overnight.  Consultants:    Procedures:  4/25 partial first ray amputation of left foot and partial resection of second metatarsal left foot  4/30 left TMA.   Antimicrobials:  Anti-infectives (From admission, onward)    Start     Dose/Rate Route Frequency Ordered Stop   01/01/24 1000  metroNIDAZOLE  (FLAGYL ) tablet 500 mg        500 mg Oral Every 12 hours 01/01/24 0849     01/01/24 1000  linezolid  (ZYVOX ) tablet 600 mg        600 mg Oral Every 12 hours 01/01/24 0849     12/29/23 1253  vancomycin  (VANCOCIN ) powder  Status:  Discontinued          As needed 12/29/23 1309 12/29/23 1311   12/29/23 1253  tobramycin  (NEBCIN ) injection  Status:  Discontinued          As needed 12/29/23 1317 12/29/23 1317   12/27/23 2200  linezolid  (ZYVOX ) IVPB 600 mg  Status:  Discontinued        600 mg 300 mL/hr over 60 Minutes Intravenous Every 12 hours 12/27/23 1319 01/01/24 0849   12/27/23 2200  metroNIDAZOLE  (FLAGYL ) IVPB 500 mg  Status:  Discontinued        500 mg 100 mL/hr over 60 Minutes Intravenous Every 12 hours 12/27/23 1321 01/01/24 0849   12/27/23 1800  cefTRIAXone  (ROCEPHIN ) 2 g in sodium chloride  0.9 % 100 mL IVPB        2 g 200 mL/hr over 30 Minutes Intravenous Every 24 hours 12/27/23 1321     12/27/23 1200   Ampicillin -Sulbactam (UNASYN ) 3 g in sodium chloride  0.9 % 100 mL IVPB       Placed in "And" Linked Group   3 g 200 mL/hr over 30 Minutes Intravenous  Once 12/27/23 1152 12/27/23 1300   12/27/23 1200  linezolid  (ZYVOX ) IVPB 600 mg       Placed in "And" Linked Group   600 mg 300 mL/hr over 60 Minutes Intravenous  Once 12/27/23 1152 12/27/23 1328       Data Reviewed: I have personally reviewed following labs and imaging studies CBC: Recent Labs  Lab 12/27/23 1114 12/28/23 0619 12/29/23 0614 12/30/23 0459 01/01/24 0523 01/02/24 0341 01/03/24 0720  WBC 2.7*   < > 1.5* 4.1 3.0* 1.8* 1.7*  NEUTROABS 1.9  --  0.9* 3.1  --   --   --   HGB 9.3*   < > 8.3* 8.4* 8.5* 7.9* 7.7*  HCT 29.4*   < > 26.2*  25.9* 27.2* 25.0* 23.8*  MCV 81.9   < > 82.1 81.2 83.2 83.3 81.0  PLT 46*   < > 44* 49* 65* 57* 53*   < > = values in this interval not displayed.   Basic Metabolic Panel: Recent Labs  Lab 12/30/23 0459 12/31/23 0427 01/01/24 0523 01/02/24 0341 01/03/24 0346  NA 132* 132* 135 137 135  K 3.3* 3.5 3.5 3.9 3.7  CL 107 103 105 108 106  CO2 21* 24 24 24  20*  GLUCOSE 110* 117* 101* 103* 101*  BUN 6 6 10 10 10   CREATININE 0.58* 0.64 0.71 0.57* 0.67  CALCIUM  7.8* 7.8* 7.8* 8.3* 7.9*  MG 1.4* 1.6* 2.0 1.7 1.7  PHOS 2.7 2.3* 3.3 3.5 3.2   GFR: Estimated Creatinine Clearance: 139.9 mL/min (by C-G formula based on SCr of 0.67 mg/dL). Liver Function Tests: Recent Labs  Lab 12/27/23 1114 12/28/23 0619 01/02/24 0341 01/03/24 0346  AST 60* 42*  --   --   ALT 26 21  --   --   ALKPHOS 98 81  --   --   BILITOT 2.0* 1.8*  --   --   PROT 6.4* 5.5*  --   --   ALBUMIN 3.1* 2.5* 2.8* 2.4*   CBG: Recent Labs  Lab 12/30/23 0846 12/30/23 1208  GLUCAP 112* 117*    Recent Results (from the past 240 hours)  Aerobic/Anaerobic Culture w Gram Stain (surgical/deep wound)     Status: None (Preliminary result)   Collection Time: 12/29/23 12:41 PM   Specimen: Wound; Tissue  Result Value Ref  Range Status   Specimen Description   Final    TISSUE BONE LEFT FOOT Performed at [facility], [address], [place], [place] [address]    Special Requests   Final    NONE Performed at [facility], [address] [place], [place] [address]    Gram Stain   Final    NO WBC SEEN RARE GRAM POSITIVE COCCI IN PAIRS IN SINGLES Performed at [facility], [address], [place], [place] [address]    Culture   Final    RARE ENTEROCOCCUS FAECIUM VANCOMYCIN  RESISTANT ENTEROCOCCUS ISOLATED RARE OLIGELLA URETHRALIS BETA LACTAMASE NEGATIVE NO ANAEROBES ISOLATED; CULTURE IN PROGRESS FOR 5 DAYS    Report Status PENDING  Incomplete   Organism ID, Bacteria ENTEROCOCCUS FAECIUM  Final      Susceptibility   Enterococcus faecium - MIC*    AMPICILLIN  >=32 RESISTANT Resistant     VANCOMYCIN  >=32 RESISTANT Resistant     GENTAMICIN SYNERGY SENSITIVE Sensitive     LINEZOLID  2 SENSITIVE Sensitive     * RARE ENTEROCOCCUS FAECIUM  Culture, blood (Routine X 2) w Reflex to ID Panel     Status: None (Preliminary result)   Collection Time: 12/30/23  8:08 AM   Specimen: BLOOD  Result Value Ref Range Status   Specimen Description BLOOD RAC  Final   Special Requests   Final    BOTTLES DRAWN AEROBIC AND ANAEROBIC Blood Culture adequate volume   Culture   Final    NO GROWTH 4 DAYS Performed at [facility], [address] [place], [place] [address]    Report Status PENDING  Incomplete  Culture, blood (Routine X 2) w Reflex to ID Panel     Status: None (Preliminary result)   Collection Time: 12/30/23  8:08 AM   Specimen: BLOOD  Result Value Ref Range Status   Specimen Description  BLOOD [facility]  Final   Special Requests   Final    BOTTLES DRAWN AEROBIC AND ANAEROBIC Blood Culture adequate volume   Culture   Final    NO GROWTH 4 DAYS Performed at [facility], [address] [place], [place] [address]    Report Status PENDING  Incomplete     Radiology  Studies: No results found.  Scheduled Meds:  folic acid   1 mg Oral Daily   linezolid   600 mg Oral Q12H   metroNIDAZOLE   500 mg Oral Q12H   multivitamin with minerals  1 tablet Oral Daily   polyethylene glycol  17 g Oral Daily   senna-docusate  1 tablet Oral QHS   sodium chloride  flush  3 mL Intravenous Q12H   thiamine   100 mg Oral Daily   Or   thiamine   100 mg Intravenous Daily   Continuous Infusions:  cefTRIAXone  (ROCEPHIN )  IV 2 g (01/02/24 1705)   magnesium  sulfate bolus IVPB 2 g (01/03/24 0943)     LOS: 7 days  MDM: Patient is high risk for one or more organ failure.  They necessitate ongoing hospitalization for continued IV therapies and subsequent lab monitoring. Total time spent interpreting labs and vitals, reviewing the medical record, coordinating care amongst consultants and care team members, directly assessing and discussing care with the patient and/or family: 55 min  Kazuko Clemence, DO [facility]  To contact the attending physician between 7A-7P please use Epic Chat. To contact the covering physician during after hours 7P-7A, please review Amion.  01/03/2024, 10:18 AM   *This document has been created with the assistance of dictation software. Please excuse typographical errors. *

## 2024-01-03 NOTE — Progress Notes (Addendum)
 Patient received from procedure via bed in stable condition.left leg elevated with pillows.

## 2024-01-03 NOTE — Anesthesia Preprocedure Evaluation (Signed)
 Anesthesia Evaluation  Patient identified by MRN, date of birth, ID band Patient awake    Reviewed: Allergy & Precautions, H&P , NPO status , Patient's Chart, lab work & pertinent test results  Airway Mallampati: III  TM Distance: >3 FB Neck ROM: full    Dental  (+) Chipped   Pulmonary Current Smoker and Patient abstained from smoking.   Pulmonary exam normal        Cardiovascular hypertension, (-) angina (-) DOE Normal cardiovascular exam+ Valvular Problems/Murmurs      Neuro/Psych  PSYCHIATRIC DISORDERS Anxiety Depression     Neuromuscular disease    GI/Hepatic ,GERD  ,,(+) Cirrhosis     substance abuse  alcohol use  Endo/Other  negative endocrine ROS    Renal/GU      Musculoskeletal   Abdominal   Peds  Hematology  (+) Blood dyscrasia, anemia   Anesthesia Other Findings Left foot Osteomyelitis Pt with alcohol abuse and withdrawal on 4/23.   PER ED NOTE: Derrick Blanchard states that approximately 2-3 days ago, he was walking his dog when he felt a pop sensation and noticed that the wound seemed to open up on the bottom of his left foot.  Since then, he has been having worsening redness, swelling, pain and drainage with a foul odor.  He endorses hot and cold chills, malaise but denies any nausea or vomiting.  He occasionally sees some blood in his stool.  Thrombocytopenia now s/p 2 units platelets 4/11  Past Medical History: No date: Anemia No date: Anxiety No date: Cirrhosis (HCC) No date: Depression No date: Heart murmur No date: Hepatitis No date: Hypertension No date: Substance abuse [facility])  Past Surgical History: No date: COLONOSCOPY 02/03/2015: ESOPHAGOGASTRODUODENOSCOPY (EGD) WITH PROPOFOL ; N/A     Comment:  Procedure: ESOPHAGOGASTRODUODENOSCOPY (EGD) WITH               PROPOFOL ;  Surgeon: Luella Sager, MD;  Location:               [facility] ENDOSCOPY;  Service: Endoscopy;  Laterality: N/A; 12/21/2017:  ESOPHAGOGASTRODUODENOSCOPY (EGD) WITH PROPOFOL ; N/A     Comment:  Procedure: ESOPHAGOGASTRODUODENOSCOPY (EGD) WITH               PROPOFOL ;  Surgeon: Luke Salaam, MD;  Location: [facility]               ENDOSCOPY;  Service: Gastroenterology;  Laterality: N/A; No date: FISSURECTOMY  BMI    Body Mass Index: 32.69 kg/m      Reproductive/Obstetrics negative OB ROS                             Anesthesia Physical Anesthesia Plan  ASA: 3  Anesthesia Plan: General   Post-op Pain Management: Regional block*   Induction: Intravenous  PONV Risk Score and Plan: 2 and Ondansetron , Dexamethasone and Midazolam   Airway Management Planned: LMA  Additional Equipment:   Intra-op Plan:   Post-operative Plan: Extubation in OR  Informed Consent: I have reviewed the patients History and Physical, chart, labs and discussed the procedure including the risks, benefits and alternatives for the proposed anesthesia with the patient or authorized representative who has indicated his/her understanding and acceptance.     Dental Advisory Given  Plan Discussed with: Anesthesiologist, CRNA and Surgeon  Anesthesia Plan Comments: (Patient consented for risks of anesthesia including but not limited to:  - adverse reactions to medications - damage to eyes, teeth, lips or  other oral mucosa - nerve damage due to positioning  - sore throat or hoarseness - Damage to heart, brain, nerves, lungs, other parts of body or loss of life  Patient voiced understanding and assent.)       Anesthesia Quick Evaluation

## 2024-01-03 NOTE — Anesthesia Procedure Notes (Signed)
 Procedure Name: LMA Insertion Date/Time: 01/03/2024 7:52 AM  Performed by: Ellwood Haber, CRNAPre-anesthesia Checklist: Patient identified, Patient being monitored, Timeout performed, Emergency Drugs available and Suction available Patient Re-evaluated:Patient Re-evaluated prior to induction Oxygen Delivery Method: Circle system utilized Preoxygenation: Pre-oxygenation with 100% oxygen Induction Type: IV induction Ventilation: Mask ventilation without difficulty LMA: LMA inserted LMA Size: 4.0 Tube type: Oral Number of attempts: 1 Placement Confirmation: positive ETCO2 and breath sounds checked- equal and bilateral Tube secured with: Tape Dental Injury: Teeth and Oropharynx as per pre-operative assessment  Comments: Smooth atraumatic placement of Igel LMA, no complications noted.

## 2024-01-03 NOTE — Consult Note (Signed)
 PHARMACY CONSULT NOTE  Pharmacy Consult for Electrolyte Monitoring and Replacement   Recent Labs: Potassium (mmol/L)  Date Value  01/03/2024 3.7  02/12/2013 4.7   Magnesium  (mg/dL)  Date Value  09/81/1914 1.7  02/11/2013 2.0   Calcium  (mg/dL)  Date Value  78/29/5621 7.9 (L)   Calcium , Total (mg/dL)  Date Value  30/86/5784 8.3 (L)   Albumin (g/dL)  Date Value  69/62/9528 2.4 (L)  11/24/2017 2.9 (L)  02/11/2013 2.3 (L)   Phosphorus (mg/dL)  Date Value  41/32/4401 3.2   Sodium (mmol/L)  Date Value  01/03/2024 135  11/24/2017 144  02/12/2013 138   Assessment: Derrick Blanchard is a 43 yo male who presented to the ED due to an open wound on their L foot. They have a history of recurrent hypomagnesemia. Patient also has extensive alcohol use disorder. They drink about half gallon of liquor per day. Pharmacy has been consulted to monitor this patient's electrolytes.   Goal of Therapy:  Electrolytes WNL  Plan:  Mag 1.7  Will order Magnesium  sulfate 2 gm IV x 1 Continue to monitor electrolytes daily   Thank you for letting pharmacy participate in this patient's care.  Thomasine Flick PharmD Clinical Pharmacist 01/03/2024

## 2024-01-03 NOTE — Op Note (Signed)
 Full Operative Report  Date of Operation: 8:32 AM, 01/03/2024   Patient: Derrick Blanchard - 43 y.o. male  Surgeon: Evertt Hoe, DPM   Assistant: None  Diagnosis: Necrosis / dehiscence of surgical site  Procedure:  1.  Transmetatarsal amputation, left foot    Anesthesia: Anesthesia type not filed in the log.  Enrique Harvest, MD  Anesthesiologist: Enrique Harvest, MD CRNA: Ellwood Haber, CRNA Student Nurse Anesthetist: Lanita Pitman, RN   Estimated Blood Loss: 10 mL  Hemostasis: 1) Anatomical dissection, mechanical compression, electrocautery 2) ankle tourniquet inflated 250 mmHg - 23 minutes  Implants: * No implants in log *  Materials: Prolene 2-0 and skin staples  Injectables: 1) Pre-operatively: 10 cc of 0.5% marcaine  plain 2) Post-operatively: 10 cc of 0.5% marcaine  plain  Specimens: Pathology: Left forefoot for pathology microbiology: None   Antibiotics: IV antibiotics given per schedule on the floor  Drains: None  Complications: Patient tolerated the procedure well without complication.   Operative findings: As below in detailed report  Indications for Procedure: Derrick Blanchard presents to [facility], [place] with a chief complaint of dehiscence and necrosis of the distal aspect of the left medial foot amputation site.  Patient has been ambulating constantly against recommendations which is led to increased necrosis swelling and dehiscence/maceration due to bleeding of the amputation line on the left foot.  This is status post prior partial 1st and 2nd ray amputations earlier this admission.  The patient has failed conservative treatments of various modalities. At this time the patient has elected to proceed with surgical correction. All alternatives, risks, and complications of the procedures were thoroughly explained to the patient. Patient exhibits appropriate understanding of all discussion points and informed consent was  signed and obtained in the chart with no guarantees to surgical outcome given or implied.  Description of Procedure: Patient was brought to the operating room. Patient remained on their hospital bed in the supine position. A surgical timeout was performed and all members of the operating room, the procedure, and the surgical site were identified. anesthesia occurred as per anesthesia record. Local anesthetic as previously described was then injected about the operative field in a local infiltrative block.  The operative lower extremity as noted above was then prepped and draped in the usual sterile manner. The following procedure then began.  Attention was directed to the LEFT lower extremity. A fish-mouth type incision was made proximal to the web spaces and encompassed the entire forefoot as well as the amp line medially medially. The full-thickness incision was made with a longer plantar flap to allow for wound closure. The incision was continued through the soft tissue down to the shafts of the metatarsal bones. A 15 blade was then used to free up the periosteum on all the metatarsal shafts 3-5. Using an oscillating saw, the metatarsals were cut in a dorsal distal to plantar proximal orientation. First met was disarticulated at the TMTJ. and the fifth metatarsal was beveled. The amputation was done so that a slight metatarsal parabola was maintained.  The distal portion including all the digits were freed from the metatarsals and soft tissue attachments. The specimen was passed off the field and sent for gross pathology. All remaining non-viable and necrotic tissues were sharply resected and removed. Extensor and flexors tendons were grasped with a hemostat and cut proximally. The surgical site was then flushed with 3000ml of saline under power pulse lavage. The plantar flap was brought in approximation with the dorsal flap  and the sutures material previously described was used for closure. Care was taken  not to place the flaps under tension in order not to jeopardize the vascular supply.  The surgical site was then dressed with Xeroform 4 x 4 gauze ABD pad x 2 and Kerlix and Ace wrap. The patient tolerated both the procedure and anesthesia well with vital signs stable throughout. The patient was transferred in good condition and all vital signs stable  from the OR to recovery under the discretion of anesthesia.  Condition: Vital signs stable, neurovascular status unchanged from preoperative   Surgical plan:  Expect clean surgical margin is imperative that the patient remain nonweightbearing on his left lower extremity.  He was given a cam boot postoperatively.  He is not to use the cam boot to ambulate in his boot for protection.  If he fails to follow instructions he will almost certainly end up with dehiscence of the surgical site and need more proximal amputation.  He has been advised of this.  Recommend 7 days p.o. antibiotics at this point no further IV antibiotics are required.  Patient stable for discharge in 1 to 2 days  The patient will be nonweightbearing in a cam boot to the operative limb until further instructed. The dressing is to remain clean, dry, and intact. Will continue to follow unless noted elsewhere.   Russ Course, DPM [facility]

## 2024-01-03 NOTE — Progress Notes (Signed)
 OT Cancellation Note  Patient Details Name: Derrick Blanchard MRN: [id number] DOB: Feb 07, 1981   Cancelled Treatment:    Reason Eval/Treat Not Completed: Medical issues which prohibited therapy. Pt in surgery today for Left foot transmetatarsal amputation. Will evaluate patient for OT services at a later date, as he is cleared for rehab participation from his MD.   Basil Boston 01/03/2024, 10:36 AM

## 2024-01-03 NOTE — Plan of Care (Addendum)
 Patient is alert and oriented X 4.he has been using a cam boot in his left leg. He has been complaining pain and gave pain medicine as order. Plan of care ongoing.   Problem: Education: Goal: Knowledge of General Education information will improve Description: Including pain rating scale, medication(s)/side effects and non-pharmacologic comfort measures Outcome: Progressing   Problem: Health Behavior/Discharge Planning: Goal: Ability to manage health-related needs will improve Outcome: Progressing   Problem: Clinical Measurements: Goal: Ability to maintain clinical measurements within normal limits will improve Outcome: Progressing Goal: Will remain free from infection Outcome: Progressing Goal: Diagnostic test results will improve Outcome: Progressing Goal: Respiratory complications will improve Outcome: Progressing Goal: Cardiovascular complication will be avoided Outcome: Progressing   Problem: Activity: Goal: Risk for activity intolerance will decrease Outcome: Progressing   Problem: Nutrition: Goal: Adequate nutrition will be maintained Outcome: Progressing   Problem: Coping: Goal: Level of anxiety will decrease Outcome: Progressing

## 2024-01-03 NOTE — Anesthesia Postprocedure Evaluation (Signed)
 Anesthesia Post Note  Patient: Derrick Blanchard  Procedure(s) Performed: AMPUTATION, FOOT, TRANSMETATARSAL (Left: Toe)  Patient location during evaluation: PACU Anesthesia Type: General Level of consciousness: awake and alert Pain management: pain level controlled Vital Signs Assessment: post-procedure vital signs reviewed and stable Respiratory status: spontaneous breathing, nonlabored ventilation, respiratory function stable and patient connected to nasal cannula oxygen Cardiovascular status: blood pressure returned to baseline and stable Postop Assessment: no apparent nausea or vomiting Anesthetic complications: no  No notable events documented.   Last Vitals:  Vitals:   01/03/24 0845 01/03/24 0852  BP: 129/76 129/76  Pulse: 88 81  Resp: 18 18  Temp:    SpO2: 97% 95%    Last Pain:  Vitals:   01/03/24 0852  TempSrc:   PainSc: 3                  Enrique Harvest

## 2024-01-04 ENCOUNTER — Other Ambulatory Visit: Payer: Self-pay

## 2024-01-04 ENCOUNTER — Encounter: Payer: Self-pay | Admitting: Podiatry

## 2024-01-04 DIAGNOSIS — K7030 Alcoholic cirrhosis of liver without ascites: Secondary | ICD-10-CM | POA: Diagnosis not present

## 2024-01-04 DIAGNOSIS — D61818 Other pancytopenia: Secondary | ICD-10-CM | POA: Diagnosis not present

## 2024-01-04 DIAGNOSIS — F1020 Alcohol dependence, uncomplicated: Secondary | ICD-10-CM | POA: Diagnosis not present

## 2024-01-04 DIAGNOSIS — T8740 Infection of amputation stump, unspecified extremity: Secondary | ICD-10-CM | POA: Diagnosis not present

## 2024-01-04 LAB — RENAL FUNCTION PANEL
Albumin: 3.1 g/dL — ABNORMAL LOW (ref 3.5–5.0)
Anion gap: 8 (ref 5–15)
BUN: 13 mg/dL (ref 6–20)
CO2: 21 mmol/L — ABNORMAL LOW (ref 22–32)
Calcium: 8.6 mg/dL — ABNORMAL LOW (ref 8.9–10.3)
Chloride: 108 mmol/L (ref 98–111)
Creatinine, Ser: 0.56 mg/dL — ABNORMAL LOW (ref 0.61–1.24)
GFR, Estimated: >60 mL/min (ref >60)
Glucose, Bld: 128 mg/dL — ABNORMAL HIGH (ref 70–99)
Phosphorus: 2.3 mg/dL — ABNORMAL LOW (ref 2.5–4.6)
Potassium: 4.2 mmol/L (ref 3.5–5.1)
Sodium: 137 mmol/L (ref 135–145)

## 2024-01-04 LAB — CULTURE, BLOOD (ROUTINE X 2)
Culture: NO GROWTH
Culture: NO GROWTH
Special Requests: ADEQUATE
Special Requests: ADEQUATE

## 2024-01-04 LAB — CBC
HCT: 29.3 % — ABNORMAL LOW (ref 39.0–52.0)
Hemoglobin: 9.1 g/dL — ABNORMAL LOW (ref 13.0–17.0)
MCH: 26.1 pg (ref 26.0–34.0)
MCHC: 31.1 g/dL (ref 30.0–36.0)
MCV: 84 fL (ref 80.0–100.0)
Platelets: 61 10*3/uL — ABNORMAL LOW (ref 150–400)
RBC: 3.49 MIL/uL — ABNORMAL LOW (ref 4.22–5.81)
RDW: 19.3 % — ABNORMAL HIGH (ref 11.5–15.5)
WBC: 3.6 10*3/uL — ABNORMAL LOW (ref 4.0–10.5)
nRBC: 0 % (ref 0.0–0.2)

## 2024-01-04 LAB — MAGNESIUM: Magnesium: 2 mg/dL (ref 1.7–2.4)

## 2024-01-04 MED ORDER — OXYCODONE HCL 5 MG PO TABS
5.0000 mg | ORAL_TABLET | Freq: Four times a day (QID) | ORAL | 0 refills | Status: DC | PRN
  Filled 2024-01-04: qty 15, 4d supply, fill #0

## 2024-01-04 MED ORDER — K PHOS MONO-SOD PHOS DI & MONO 155-852-130 MG PO TABS
500.0000 mg | ORAL_TABLET | Freq: Once | ORAL | Status: AC
  Administered 2024-01-04: 500 mg via ORAL
  Filled 2024-01-04: qty 2

## 2024-01-04 MED ORDER — POLYETHYLENE GLYCOL 3350 17 GM/SCOOP PO POWD
17.0000 g | Freq: Every day | ORAL | 0 refills | Status: DC
  Filled 2024-01-04: qty 238, 14d supply, fill #0

## 2024-01-04 MED ORDER — LINEZOLID 600 MG PO TABS
600.0000 mg | ORAL_TABLET | Freq: Two times a day (BID) | ORAL | 0 refills | Status: DC
  Filled 2024-01-04: qty 10, 5d supply, fill #0

## 2024-01-04 NOTE — Progress Notes (Signed)
 Pt DC and taken to the front of the Medical mall. After NT was waiting 45 min PT refused to come back inside. Pt was left on the front bench at the medical mall entrance.

## 2024-01-04 NOTE — Progress Notes (Signed)
 Occupational Therapy Treatment Patient Details Name: Derrick Blanchard MRN: [id number] DOB: July 14, 1981 Today's Date: 01/04/2024   History of present illness Derrick Blanchard is a 43 y.o. 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 Pt not following NWB instructions as given, AMB frequently ad lib in hallway. Required revision to TMA on 4/30.   OT comments  Pt is able to state WBing restrictions and can demonstrate performing them correctly, states understanding of use of CAM boot. Pt reports 8/10 pain in L foot, requests pain meds; nurse notified. Engaged in extensive discussion with pt re: engaging in a healthier lifestyle. Used motivational interviewing techniques to explore motivation and strategies for addressing SUD. Pt reports that, several years ago, he managed to go 1+ year without smoking or drinking and could describe methods he used to maintain sobriety. Pt tearful and thoughtful throughout session.       If plan is discharge home, recommend the following:  A little help with walking and/or transfers;A little help with bathing/dressing/bathroom;Supervision due to cognitive status   Equipment Recommendations       Recommendations for Other Services Other (comment) (list of AA meeting times/location in area, other area resources for addressing alcohol use disorder)    Precautions / Restrictions Precautions Precautions: Fall Recall of Precautions/Restrictions: Intact Required Braces or Orthoses: Other Brace Other Brace: CAM boot Restrictions Weight Bearing Restrictions Per Provider Order: Yes LLE Weight Bearing Per Provider Order: Non weight bearing       Mobility Bed Mobility Overal bed mobility: Modified Independent                   Transfers Overall transfer level: Needs assistance Equipment used: Rolling walker (2 wheels) Transfers: Sit to/from Stand Sit to Stand: Modified independent (Device/Increase time)                 Balance Overall balance assessment: Needs assistance Sitting-balance support: No upper extremity supported, Feet supported Sitting balance-Leahy Scale: Good     Standing balance support: Bilateral upper extremity supported Standing balance-Leahy Scale: Fair                             ADL either performed or assessed with clinical judgement   ADL Overall ADL's : Needs assistance/impaired                     Lower Body Dressing: Supervision/safety Lower Body Dressing Details (indicate cue type and reason): Pt able to don/doff CAM boot                    Extremity/Trunk Assessment Upper Extremity Assessment Upper Extremity Assessment: Overall WFL for tasks assessed   Lower Extremity Assessment Lower Extremity Assessment: Defer to PT evaluation        Vision       Perception     Praxis     Communication Communication Communication: No apparent difficulties   Cognition Arousal: Alert Behavior During Therapy: WFL for tasks assessed/performed, Lability                                 Following commands: Intact        Cueing  Exercises Other Exercises Other Exercises: Educ re: addressing SUD    Shoulder Instructions       General Comments      Pertinent Vitals/ Pain       Pain Assessment Pain Score: 8  Pain Location: L foot Pain Descriptors / Indicators: Aching, Grimacing Pain Intervention(s): Patient requesting pain meds-RN notified, Repositioned, Utilized relaxation techniques  Home Living Family/patient expects to be discharged to:: Shelter/Homeless                                        Prior Functioning/Environment              Frequency  Min 1X/week        Progress  Toward Goals  OT Goals(current goals can now be found in the care plan section)        Plan      Co-evaluation                 AM-PAC OT "6 Clicks" Daily Activity     Outcome Measure   Help from another person eating meals?: None Help from another person taking care of personal grooming?: None Help from another person toileting, which includes using toliet, bedpan, or urinal?: A Little Help from another person bathing (including washing, rinsing, drying)?: A Little Help from another person to put on and taking off regular upper body clothing?: None Help from another person to put on and taking off regular lower body clothing?: A Little 6 Click Score: 21    End of Session Equipment Utilized During Treatment: Rolling walker (2 wheels)  OT Visit Diagnosis: Other abnormalities of gait and mobility (R26.89);Muscle weakness (generalized) (M62.81);Unsteadiness on feet (R26.81);Pain Pain - Right/Left: Left Pain - part of body: Ankle and joints of foot   Activity Tolerance Patient tolerated treatment well   Patient Left in bed;with call bell/phone within reach   Nurse Communication          Time: 1610-9604 OT Time Calculation (min): 35 min  Charges: OT General Charges $OT Visit: 1 Visit OT Evaluation $OT Eval Moderate Complexity: 1 Mod OT Treatments $Self Care/Home Management : 23-37 mins  Basil Boston, PhD, MS, OTR/L 01/04/24, 12:44 PM

## 2024-01-04 NOTE — Progress Notes (Signed)
 Patient out of room multiple times requesting pain medication and to leave facility to go to "cash points ATM and DMV" with male visitor. Patient states visitor's name is Joesph Mussel and he has been living with her. Patient states "She is here to get money I owe her. My check just hit last night."   After receiving permission from MD, patient transported via w/c to ATM downstairs. Pt removed $400 in cash from ATM. Upon returning to the floor, visitor Joesph Mussel and another male took possession of money. Patient asked if they would transport him home today at discharge. Unsure if they will provide transportation or if he will even be able to return to their residence. TOC made aware.

## 2024-01-04 NOTE — Plan of Care (Signed)
  Problem: Education: Goal: Knowledge of General Education information will improve Description: Including pain rating scale, medication(s)/side effects and non-pharmacologic comfort measures Outcome: Not Progressing   Problem: Health Behavior/Discharge Planning: Goal: Ability to manage health-related needs will improve Outcome: Not Progressing   Problem: Pain Managment: Goal: General experience of comfort will improve and/or be controlled Outcome: Not Progressing   Problem: Safety: Goal: Ability to remain free from injury will improve Outcome: Not Progressing   Problem: Clinical Measurements: Goal: Ability to maintain clinical measurements within normal limits will improve Outcome: Progressing Goal: Will remain free from infection Outcome: Progressing Goal: Diagnostic test results will improve Outcome: Progressing Goal: Respiratory complications will improve Outcome: Progressing Goal: Cardiovascular complication will be avoided Outcome: Progressing   Problem: Activity: Goal: Risk for activity intolerance will decrease Outcome: Progressing   Problem: Nutrition: Goal: Adequate nutrition will be maintained Outcome: Progressing   Problem: Coping: Goal: Level of anxiety will decrease Outcome: Progressing   Problem: Elimination: Goal: Will not experience complications related to bowel motility Outcome: Progressing Goal: Will not experience complications related to urinary retention Outcome: Progressing   Problem: Skin Integrity: Goal: Risk for impaired skin integrity will decrease Outcome: Progressing

## 2024-01-04 NOTE — TOC Progression Note (Signed)
 Transition of Care [facility]) - Progression Note    Patient Details  Name: Derrick Blanchard MRN: [id number] Date of Birth: 16-Jan-1981  Transition [facility] Care [facility]) CM/SW Contact  Elsie Halo, RN Phone Number: 01/04/2024, 11:23 AM  Clinical Narrative:    Patient is medically clear to discharge. The patient is homeless, but has a friend that will allow him to stay with her for $400 a month. The friend did not agree to transport the patient. TOC will provide a Conservation officer, nature. A list of resources for housing, transportation, and food were added to the AVS.  TOC sent a referral to [employer] for a RW, Sam Creighton accepted the request.  11:55 AM: Autry Legions with [employer] advised that patient received a RW on 4/17. Insurance will not cover another one.        Barriers to Discharge: Continued Medical Work up  Expected Discharge Plan and Services   Discharge Planning Services: CM Consult                     DME Arranged: Otho Blitz rolling DME Agency: [employer]                   Social Determinants of Health (SDOH) Interventions SDOH Screenings   Food Insecurity: Food Insecurity Present (12/27/2023)  Housing: High Risk (12/27/2023)  Transportation Needs: Unmet Transportation Needs (12/27/2023)  Utilities: At Risk (12/27/2023)  Financial Resource Strain: High Risk (03/22/2022)   Received from [facility], [facility]  Social Connections: Patient Declined (12/27/2023)  Tobacco Use: High Risk (01/03/2024)    Readmission Risk Interventions     No data to display

## 2024-01-04 NOTE — Discharge Summary (Signed)
 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 *  Hospital Course: Derrick Blanchard is a 43 year old male with history of cirrhosis secondary to alcohol abuse, active alcohol use disorder, thrombocytopenia, hypertension, depression, presented to the ED with wound dehiscence.  Patient underwent I&D on 4/18 with bone biopsy.  Pathology results were negative for osteomyelitis at that time.  When he presented into the ED the wound had dehisced.  Podiatry was consulted.  On 4/27 patient went for partial first ray amputation of left foot and partial resection of second metatarsal left foot.  Despite extensive counseling patient continued to walk on the left foot and ultimately that wound dehisced as well.  On 4/30 he underwent left sided TMA.  Despite extensive counseling, patient then began ambulating with weightbearing on left foot immediately postop.  We reiterated to patient on multiple occasions that if he continues to ambulate on the left foot he has high risk of dehiscence and will require a BKA.  He endorses understanding.  Patient is adamant for discharge home today.  He has worked with physical therapy and demonstrates abilities with walker.  Outpatient PT has been ordered.  He will need to follow-up closely with podiatry in the clinic  in 1 week.  Will need to maintain strict nonweightbearing to left lower extremity.  He will continue antibiotics for 7 days postop.   Infection of toes complication of amputation Left foot osteomyelitis Wound dehiscence - Status post partial first ray amputation of left foot and partial resection of second metatarsal left foot on 4/25. - Patient continued to ambulate despite recommendations. - 4/30 patient underwent left TMA. - Continue to reiterate the importance of strict nonweightbearing to left lower extremity. He has been advised as to the risk of dehiscence and requiring a BKA if he continues to ambulate. - Wound culture growing Enterococcus resistant to amoxicillin  and vancomycin .  Has been on ceftriaxone , Flagyl , Zyvox .  Per podiatry we can expect clean surgical margins postoperatively.  Will proceed with linezolid  only now for 7 days. --Follow-up podiatry clinic in 1 week   Alcohol use disorder, severe, dependence - Alcohol use varies.  Previously half a gallon of liquor daily.  Now regularly 15 beers - Completed Librium  01/01/2024 - Continue with as needed Ativan  - Continue multivitamin  - Continue to follow closely   Alcoholic cirrhosis of the liver - Currently compensated - Continue to follow closely   Bruises, multiple skin lesions - Patient has bruises in multiple stages of healing all over his chest and abdomen.  He reports that this is from "roughhousing with friends" prior to arrival. - Have educated patient on the importance of caution and behavior modifications given his thrombocytopenia and high risk for bleeds.   Pancytopenia - Secondary to cirrhosis as above - Trend CBC   Hematochezia - Intermittent mild red blood in  stool.  Resolved now - Trend hemoglobin - No indication for transfusion or urgent GI evaluation at this time. - Can follow-up outpatient with GI   Hypokalemia Hypomagnesemia - Electrolyte replacements per protocol per pharmacy  BMI 33 --  Outpatient follow up for lifestyle modification and risk factor management   Consultants: Podiatry Procedures performed: 4/25 partial first ray amputation of left foot and partial resection of second metatarsal left foot  4/30 left TMA.     Disposition: Home Diet recommendation:  Discharge Diet Orders (From admission, onward)     Start     Ordered   01/04/24 0000  Diet general        01/04/24 1214           Regular diet DISCHARGE MEDICATION: Allergies as of 01/04/2024   No Known Allergies      Medication List     TAKE these medications    folic acid  1 MG tablet Commonly known as: FOLVITE  Take 1 tablet (1 mg total) by mouth daily.   linezolid  600 MG tablet Commonly known as: ZYVOX  Take 1 tablet (600 mg total) by mouth every 12 (twelve) hours for 5 days.   multivitamin with minerals Tabs tablet Take 1 tablet by mouth daily.   oxyCODONE  5 MG immediate release tablet Commonly known as: Oxy IR/ROXICODONE  Take 1 tablet (5 mg total) by mouth every 6 (six) hours as needed for up to 5 days for severe pain (pain score 7-10) (for pain not relieved by tylenol  alone.). What changed:  how much to take reasons to take this   pantoprazole  40 MG tablet Commonly known as: PROTONIX  Take 1 tablet (40 mg total) by mouth daily.   polyethylene glycol powder 17 GM/SCOOP powder Commonly known as: GLYCOLAX /MIRALAX  Take 17 g by mouth daily.   thiamine  100 MG tablet Commonly known as: VITAMIN B1 Take 1 tablet (100 mg total) by mouth daily.               Durable Medical Equipment  (From admission, onward)           Start     Ordered   01/04/24 0914  For home use only DME Walker rolling  Once       Question Answer Comment  Walker: With 5 Inch Wheels   Patient needs a walker to treat with the following condition S/P transmetatarsal amputation of foot, left (HCC)      01/04/24 0914            Follow-up Information     [facility]  Follow up.   Why: Agency will deliver Rolling Walker to the patient's room. Contact information: [address] [place] [address] [phone]                Discharge Exam: Filed Weights   12/27/23 1110 12/29/23 1146 01/03/24 0655  Weight: 99.8 kg 99 kg 99 kg   General exam: Restless, sitting on edge of bed, attempting to ambulate. Respiratory system: No work of breathing, symmetric chest wall expansion Gastrointestinal system: Abdomen is nondistended, soft and nontender.  Neuro: Alert and oriented. No focal neurological deficits. Extremities: Bruises in various stages of healing, large circular bruises around chest, abdomen, back, arms. Left foot in Cam boot. Psychiatry: Demonstrates poor judgement and insight    Condition at discharge: stable  The results of significant diagnostics from this hospitalization (including imaging, microbiology, ancillary and laboratory) are listed below for reference.   Imaging Studies: DG Foot  2 Views Left Result Date: 01/03/2024 CLINICAL DATA:  Status post amputation EXAM: LEFT FOOT - 2 VIEW COMPARISON:  12/29/23 FINDINGS: There are changes consistent with transmetatarsal amputation new from the prior exam. Air is noted in the soft tissue wound consistent with the postoperative state. No definitive erosive changes are noted. IMPRESSION: Interval transmetatarsal amputation. Electronically Signed   By: Violeta Grey M.D.   On: 01/03/2024 10:22   DG Foot 2 Views Left Result Date: 12/29/2023 CLINICAL DATA:  Postoperative. EXAM: LEFT FOOT - 2 VIEW COMPARISON:  Left foot x-ray 12/27/2023 FINDINGS: There is been amputation of the mid and proximal second and first metatarsals as well as first phalanx. Antibiotic seeds are seen overlying the surgical site. There is soft tissue swelling, soft tissue air and skin staples compatible with recent surgery. There is no acute fracture or dislocation. There is dorsal foot soft tissue swelling. IMPRESSION:  Status post amputation of the mid and proximal second and first metatarsals as well as first phalanx. Electronically Signed   By: Tyron Gallon M.D.   On: 12/29/2023 17:35   MR FOOT LEFT W WO CONTRAST Result Date: 12/27/2023 CLINICAL DATA:  Status post incision and drainage on 12/16/2023 with increased pain and swelling. EXAM: MRI OF THE LEFT FOREFOOT WITHOUT AND WITH CONTRAST TECHNIQUE: Multiplanar, multisequence MR imaging of the left forefoot was performed both before and after administration of intravenous contrast. CONTRAST:  10mL GADAVIST  GADOBUTROL  1 MMOL/ML IV SOLN COMPARISON:  Left foot radiographs dated 12/27/2023. MRI of the left foot dated 12/13/2023. FINDINGS: Bones/Joint/Cartilage Status post interval incision and drainage with new soft tissue defect at the medial plantar forefoot, overlying the proximal to mid great toe. This soft tissue defect/wound measures up to 4.2 cm AP x 4.1 cm TR x 1.8 cm CC and extends deep to the level of the plantar margin of the flexor hallucis longus tendon at the level of the first MTP joint as well as the plantar surface of the lateral hallux sesamoid which demonstrates marrow signal abnormality with corresponding enhancement, increased since the prior exam, concerning for osteomyelitis. There is marrow signal abnormality with associated enhancement of the first proximal phalanx and the distal 2/3 of the first metatarsal. Focal region of T2 hyperintense marrow signal abnormality within the lateral aspect of the first metatarsal head with peripheral enhancement (series 12, image 15 and series 5, image 23). These findings could relate to interval postsurgical change, however, osteomyelitis can not be excluded. The soft tissue defect/wound appears to extend deep to the medial plantar margin of the second metatarsal head which demonstrates marrow signal abnormality with mild enhancement, which could reflect interval postsurgical change, however, osteomyelitis can not be  excluded. Prior amputation of the second digit the level of second MTP joint. Mild degenerative changes of the first MTP joint. No significant joint effusion. Ligaments Lisfranc ligament is intact. Muscles and Tendons Flexor and extensor compartments are intact. Trace tenosynovitis of the flexor hallucis longus tendon. Diffuse atrophy of the intrinsic musculature of the foot, likely secondary to chronic denervation changes. Soft tissue Status post interval incision and drainage with large soft tissue defect at the medial plantar forefoot, as described above, with surrounding edema and enhancement. No loculated fluid collection. IMPRESSION: 1. Status post interval incision and drainage with new large soft tissue defect at the medial plantar forefoot, overlying the proximal to mid great toe, measuring up to 4.2 x 4.1 x 1.8 cm. This collection extends deep to the plantar surface of the lateral hallux sesamoid,  which demonstrates marrow signal abnormality with corresponding enhancement, increased since the prior exam, concerning for osteomyelitis. 2. Marrow signal abnormality with associated enhancement of the first proximal phalanx and the distal 2/3 of the first metatarsal. These findings could relate to interval postsurgical change, however, osteomyelitis can not be excluded. 3. The soft tissue defect/wound appears to extend deep to the medial plantar margin of the second metatarsal head which demonstrates marrow signal abnormality with mild enhancement, which could reflect interval postsurgical change, however, osteomyelitis can not be excluded. 4. Prior amputation of second ray at the level of the second MTP joint. Electronically Signed   By: Mannie Seek M.D.   On: 12/27/2023 17:23   DG Foot Complete Left Result Date: 12/27/2023 CLINICAL DATA:  Infection status post second toe resection EXAM: LEFT FOOT - COMPLETE 3+ VIEW COMPARISON:  December 13, 2023 FINDINGS: There is no evidence of fracture or dislocation.  There is no evidence of arthropathy or other focal bone abnormality. Soft tissues are unremarkable. IMPRESSION: CORTICAL IRREGULARITY IS NOTED WITHIN THE MEDIAL CORTEX OF THE PROXIMAL PHALANX OF THE FIRST TOE.  COULD CORRELATE WITH RECENT TRAUMA AND SMALL CORTICAL FRACTURE.: IMPRESSION: CORTICAL IRREGULARITY IS NOTED WITHIN THE MEDIAL CORTEX OF THE PROXIMAL PHALANX OF THE FIRST TOE. COULD CORRELATE WITH RECENT TRAUMA AND SMALL CORTICAL FRACTURE. Post prior second toe amputation.  No evidence of osteomyelitis Electronically Signed   By: Fredrich Jefferson M.D.   On: 12/27/2023 11:51   DG MINI C-ARM IMAGE ONLY Result Date: 12/16/2023 There is no interpretation for this exam.  This order is for images obtained during a surgical procedure.  Please See "Surgeries" Tab for more information regarding the procedure.   MR FOOT LEFT WO CONTRAST Result Date: 12/13/2023 CLINICAL DATA:  Left plantar foot wound.  Concern for osteomyelitis. EXAM: MRI OF THE LEFT FOOT WITHOUT CONTRAST TECHNIQUE: Multiplanar, multisequence MR imaging of the left foot was performed. No intravenous contrast was administered. COMPARISON:  left foot radiographs dated 12/13/2023. MR left foot dated 03/11/2023. FINDINGS: Bones/Joint/Cartilage No marrow signal abnormality identified to suggest osteomyelitis. 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 No acute fracture or dislocation. Mild edema of the lateral hallux sesamoid without associated T1 marrow signal abnormality. Small first MTP joint effusion. Mild degenerative changes of the first MTP joint. Ligaments Collateral ligaments are intact.  Lisfranc ligament is intact. Muscles and Tendons Flexor and extensor compartments are intact. No significant tenosynovitis. Diffuse atrophy of the intrinsic musculature of the foot, likely reflecting chronic denervation changes. Soft tissue Plantar soft tissue ulceration underlying the first MTP joint. There is surrounding soft tissue edema and  ill-defined fluid. No discrete loculated collection. IMPRESSION: 1. Plantar soft tissue ulceration underlying the first MTP joint. Surrounding soft tissue swelling with ill-defined fluid is concerning for cellulitis with phlegmonous changes. No loculated collection identified. 2. Mild marrow edema of the lateral hallux sesamoid, similar to the prior exam. This could reflect sesamoiditis or reactive marrow change. Early osteomyelitis can not be entirely excluded. 3. Small first MTP joint effusion may be reactive, however, septic arthritis can not be excluded. 4. 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 5. Mild degenerative changes of the midfoot and first MTP joint. Electronically Signed   By: Mannie Seek M.D.   On: 12/13/2023 12:27   DG Foot Complete Left Result Date: 12/13/2023 CLINICAL DATA:  Left foot ulcer to the plantar distal first metatarsal, evaluating for possible bone involvement EXAM: LEFT FOOT - COMPLETE 3+ VIEW  COMPARISON:  10/08/2023. FINDINGS: There is diffuse osteopenia of the visualized osseous structures. No acute fracture or dislocation. No aggressive osseous lesion. Note is again made of amputation of second toe at the level of metatarsophalangeal joint. Mild diffuse degenerative changes of imaged bones. There is an approximately 1.0 x 1.9 cm lucency overlying the plantar aspect of the head of second metatarsal region with associated surrounding soft tissue swelling. However, ulcer does not reach up to the bone surface. Underlying bone is intact. No focal bone erosions. No radiopaque foreign bodies. IMPRESSION: *There is an approximately 1.0 x 1.9 cm lucency overlying the plantar aspect of the head of second metatarsal region with associated surrounding soft tissue swelling. However, ulcer does not reach up to the bone surface. Underlying bone is intact. No focal bone erosions. No radiographic evidence of osteomyelitis. Electronically Signed   By: Beula Brunswick M.D.   On:  12/13/2023 09:53    Microbiology: Results for orders placed or performed during the hospital encounter of 12/27/23  Aerobic/Anaerobic Culture w Gram Stain (surgical/deep wound)     Status: None   Collection Time: 12/29/23 12:41 PM   Specimen: Wound; Tissue  Result Value Ref Range Status   Specimen Description   Final    TISSUE BONE LEFT FOOT Performed at [facility], [address], [place], [place] [address]    Special Requests   Final    NONE Performed at [facility], [address] [place], [place] [address]    Gram Stain   Final    NO WBC SEEN RARE GRAM POSITIVE COCCI IN PAIRS IN SINGLES    Culture   Final    RARE ENTEROCOCCUS FAECIUM VANCOMYCIN  RESISTANT ENTEROCOCCUS ISOLATED RARE OLIGELLA URETHRALIS BETA LACTAMASE NEGATIVE NO ANAEROBES ISOLATED Performed at [facility], [address], [place], [place] [address]    Report Status 01/03/2024 FINAL  Final   Organism ID, Bacteria ENTEROCOCCUS FAECIUM  Final      Susceptibility   Enterococcus faecium - MIC*    AMPICILLIN  >=32 RESISTANT Resistant     VANCOMYCIN  >=32 RESISTANT Resistant     GENTAMICIN SYNERGY SENSITIVE Sensitive     LINEZOLID  2 SENSITIVE Sensitive     * RARE ENTEROCOCCUS FAECIUM  Culture, blood (Routine X 2) w Reflex to ID Panel     Status: None   Collection Time: 12/30/23  8:08 AM   Specimen: BLOOD  Result Value Ref Range Status   Specimen Description BLOOD RAC  Final   Special Requests   Final    BOTTLES DRAWN AEROBIC AND ANAEROBIC Blood Culture adequate volume   Culture   Final    NO GROWTH 5 DAYS Performed at [facility], [address] [place], [place] [address]    Report Status 01/04/2024 FINAL  Final  Culture, blood (Routine X 2) w Reflex to ID Panel     Status: None   Collection Time: 12/30/23  8:08 AM   Specimen: BLOOD  Result Value Ref Range Status   Specimen Description BLOOD [facility]  Final   Special Requests   Final    BOTTLES DRAWN AEROBIC AND  ANAEROBIC Blood Culture adequate volume   Culture   Final    NO GROWTH 5 DAYS Performed at [facility], [address], [place], [place] [address]    Report Status 01/04/2024 FINAL  Final    Labs: CBC: Recent Labs  Lab 12/29/23 [phone] 12/30/23 0459 01/01/24 7425 01/02/24 0341 01/03/24 0720 01/04/24 9563  WBC 1.5* 4.1 3.0* 1.8* 1.7* 3.6*  NEUTROABS 0.9* 3.1  --   --   --   --   HGB 8.3* 8.4* 8.5* 7.9* 7.7* 9.1*  HCT 26.2* 25.9* 27.2* 25.0* 23.8* 29.3*  MCV 82.1 81.2 83.2 83.3 81.0 84.0  PLT 44* 49* 65* 57* 53* 61*   Basic Metabolic Panel: Recent Labs  Lab 12/31/23 0427 01/01/24 0523 01/02/24 0341 01/03/24 0346 01/04/24 0517  NA 132* 135 137 135 137  K 3.5 3.5 3.9 3.7 4.2  CL 103 105 108 106 108  CO2 24 24 24  20* 21*  GLUCOSE 117* 101* 103* 101* 128*  BUN 6 10 10 10 13   CREATININE 0.64 0.71 0.57* 0.67 0.56*  CALCIUM  7.8* 7.8* 8.3* 7.9* 8.6*  MG 1.6* 2.0 1.7 1.7 2.0  PHOS 2.3* 3.3 3.5 3.2 2.3*   Liver Function Tests: Recent Labs  Lab 01/02/24 0341 01/03/24 0346 01/04/24 0517  ALBUMIN 2.8* 2.4* 3.1*   CBG: Recent Labs  Lab 12/30/23 0846 12/30/23 1208  GLUCAP 112* 117*    Discharge time spent: 33 minutes.  Signed: [place], DO [facility] 01/04/2024

## 2024-01-04 NOTE — Progress Notes (Signed)
  Subjective:  Patient ID: Derrick Blanchard, male    DOB: 1981/02/12,  MRN: [id number]  Chief Complaint  Patient presents with   Wound Dehiscence    DOS: 01/03/2024 Procedure: Transmetatarsal amputation of left foot  43 y.o. male seen for post op check.  Patient seen in the hallway he was being wheeled back from going to the ATM with the nurse.  Unclear why he needed to go to the ATM.  Report is that he may be getting discharged today.  Discussed that we can leave the dressing intact until he comes in the office next week he should leave it intact until then.  Reinforced need for nonweightbearing left lower extremity.  Review of Systems: Negative except as noted in the HPI. Denies N/V/F/Ch.   Objective:   Vitals:   01/04/24 0443 01/04/24 0734  BP: 119/69 128/66  Pulse: 78 (!) 103  Resp: 18 16  Temp: 97.9 F (36.6 C) 97.8 F (36.6 C)  SpO2: 100% 98%   Body mass index is 33.19 kg/m. Constitutional Well developed. Well nourished.  Vascular Foot warm and well perfused. Capillary refill normal to all digits.   No calf pain with palpation  Neurologic Normal speech. Oriented to person, place, and time. Epicritic sensation intact left foot  Dermatologic Dressing clean dry and intact Cam boot intact  Orthopedic: Status post left TMA   Radiographs: Disarticulation of the first metatarsal and proximal transmet amp 2 through 5  Pathology: 4/30 pending  1. Toe(s), amputation, left partial first ray :       - CHRONIC ULCERATION OF SOFT TISSUE WITH NECROSIS.       - UNDERLYING BONE WITH REACTIVE CHANGES.       - PROXIMAL MARGIN WITH ARTICULAR SURFACE OF BONE; NEGATIVE FOR OSTEOMYELITIS.        2. Toe(s), amputation, left metatarsal proximal margin :       - BONE WITH FOCAL NON-SPECIFIC REACTIVE CHANGES.       - PROXIMAL MARGIN WITH CUT SURFACE OF BONE; NEGATIVE FOR OSTEOMYELITIS.   Micro: E. Faecium, VRE, O. Uretheralis  Assessment:   Left foot dehiscence of surgical site  status post left foot TMA  Plan:  Patient was evaluated and treated and all questions answered.  POD # 1 s/p left foot TMA -Progressing as expected postoperatively. -Maintain strict nonweightbearing left lower extremity.  Cam boot is only for protection -XR: Expected postoperative changes -WB Status: Nonweightbearing in cam boot -Sutures: Remain intact 2 to 3 weeks. -Medications/ABX: Recommend 7 days p.o. ABX on discharge, Augmentin  - Dressing remain clean dry and intact until follow-up in office in 1 week - Stable for discharge today, will have office call patient to arrange follow-up        Maridee Shoemaker, DPM [facility] / [facility]

## 2024-01-04 NOTE — Consult Note (Signed)
 PHARMACY CONSULT NOTE  Pharmacy Consult for Electrolyte Monitoring and Replacement   Recent Labs: Potassium (mmol/L)  Date Value  01/04/2024 4.2  02/12/2013 4.7   Magnesium  (mg/dL)  Date Value  21/30/8657 2.0  02/11/2013 2.0   Calcium  (mg/dL)  Date Value  84/69/6295 8.6 (L)   Calcium , Total (mg/dL)  Date Value  28/41/3244 8.3 (L)   Albumin (g/dL)  Date Value  09/07/7251 3.1 (L)  11/24/2017 2.9 (L)  02/11/2013 2.3 (L)   Phosphorus (mg/dL)  Date Value  66/44/0347 2.3 (L)   Sodium (mmol/L)  Date Value  01/04/2024 137  11/24/2017 144  02/12/2013 138   Assessment: Derrick Blanchard is a 43 yo male who presented to the ED due to an open wound on their L foot. They have a history of recurrent hypomagnesemia. Patient also has extensive alcohol use disorder. They drink about half gallon of liquor per day. Pharmacy has been consulted to monitor this patient's electrolytes.   Goal of Therapy:  Electrolytes WNL  Plan:  Phos 2.3, will replace with Kphos 500mg  po x 1 dose All other electrolytes WNL Continue to monitor electrolytes daily   Derrick Blanchard PharmD, BCPS 01/04/2024 8:10 AM

## 2024-01-04 NOTE — Hospital Course (Signed)
 Derrick Blanchard is a 43 year old male with history of cirrhosis secondary to alcohol abuse, active alcohol use disorder, thrombocytopenia, hypertension, depression, presented to the ED with wound dehiscence.  Patient underwent I&D on 4/18 with bone biopsy.  Pathology results were negative for osteomyelitis at that time.  When he presented into the ED the wound had dehisced.  Podiatry was consulted.  On 4/27 patient went for partial first ray amputation of left foot and partial resection of second metatarsal left foot.  Despite extensive counseling patient continued to walk on the left foot and ultimately that wound dehisced as well.  On 4/30 he underwent left sided TMA.  Despite extensive counseling, patient then began ambulating with weightbearing on left foot immediately postop.  We reiterated to patient on multiple occasions that if he continues to ambulate on the left foot he has high risk of dehiscence and will require a BKA.  He endorses understanding.  Patient is adamant for discharge home today.  He has worked with physical therapy and demonstrates abilities with walker.  Outpatient PT has been ordered.  He will need to follow-up closely with podiatry in the clinic in 1 week.  Will need to maintain strict nonweightbearing to left lower extremity.  He will continue antibiotics for 7 days postop.

## 2024-01-04 NOTE — Evaluation (Signed)
 Physical Therapy Evaluation Patient Details Name: Derrick Blanchard MRN: [id number] DOB: 10-17-1980 Today's Date: 01/04/2024  History of Present Illness  Derrick Blanchard is a 43 y.o. 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 Pt not following NWB instructions as given, AMB frequently ad lib in hallway. Required revision to TMA on 4/30.  Clinical Impression  Pt fully dressed, seated EOB, speaking feverishly regarding a great deal, but the nuts and bolts of it are that he believes he is leaving very soon, people here are withholding his personal belongings and medicine, and variable other details that indicate significant disorientation to situation and time. Pt is able to recite his NWB status when asked. Pt able to demonstrate transfers with RW and with countertop. Pt able to demonstrate physical capacity to hop with counter or RW, but he makes awful safety calculations while improvising some motor strategies. Pt somewhat open to recommendations from PT for NWB mobility, but lacks situational awareness, safety awareness, and problem solving required to be successful. I do not see him having the capacity to follow his care instructions as they are given, even with the best of intentions. Pt would benefit from a RW to maximize NWB potential. Pt has no clear DC location in mind, sounds like he will essentially be homeless at DC until he connects with a friend. Significant degree of relevant social determinates of health indicates a great deal of social work needs at DC. All education completed, most of which he relates to a remote contralateral TMA. PT will sign off at this time.       If plan is discharge home, recommend the following: Assist for transportation;Help with  stairs or ramp for entrance;Direct supervision/assist for medications management;Direct supervision/assist for financial management;Assistance with cooking/housework;A little help with bathing/dressing/bathroom;Supervision due to cognitive status   Can travel by private vehicle   Yes    Equipment Recommendations Rolling walker (2 wheels)  Recommendations for Other Services       Functional Status Assessment Patient has had a recent decline in their functional status and demonstrates the ability to make significant improvements in function in a reasonable and predictable amount of time.     Precautions / Restrictions Precautions Precautions: Fall Recall of Precautions/Restrictions: Impaired Required Braces or Orthoses: Other Brace Other Brace: CAM boot Restrictions LLE Weight Bearing Per Provider Order: Non weight bearing      Mobility  Bed Mobility                    Transfers Overall transfer level: Needs assistance Equipment used: Rolling walker (2 wheels), None Transfers: Sit to/from Stand Sit to Stand: Supervision           General transfer comment: Inconsistent adherence to NWB LLE, mostly PWB while standing    Ambulation/Gait Ambulation/Gait assistance: Supervision, Contact guard assist Gait Distance (Feet):  (unsafe to attempt any appreciable distances.)           General Gait Details: demonstrates capacity for hopping with RW and using 1 or 2 hand support on countertop; pt verbalizes his NWB precautions, but struggles to adhere to this in practice due to baseline moderate cogntiive impairment  Stairs            Wheelchair Mobility     Tilt Bed    Modified Rankin (  Stroke Patients Only)       Balance                                             Pertinent Vitals/Pain Pain Assessment Pain Assessment:  (yes, but answers no FU questioning, tangential, hyperverbal)    Home Living Family/patient expects to be  discharged to:: Shelter/Homeless                        Prior Function                       Extremity/Trunk Assessment                Communication        Cognition Arousal: Alert Behavior During Therapy: Anxious, Impulsive, Agitated   PT - Cognitive impairments: History of cognitive impairments, Memory, Awareness, Attention, Problem solving, Safety/Judgement                                 Cueing       General Comments      Exercises     Assessment/Plan    PT Assessment Patient does not need any further PT services  PT Problem List Decreased balance;Decreased mobility;Decreased coordination;Decreased cognition;Decreased safety awareness;Decreased knowledge of precautions       PT Treatment Interventions      PT Goals (Current goals can be found in the Care Plan section)  Acute Rehab PT Goals PT Goal Formulation: All assessment and education complete, DC therapy    Frequency       Co-evaluation               AM-PAC PT "6 Clicks" Mobility  Outcome Measure Help needed turning from your back to your side while in a flat bed without using bedrails?: None Help needed moving from lying on your back to sitting on the side of a flat bed without using bedrails?: None Help needed moving to and from a bed to a chair (including a wheelchair)?: A Little Help needed standing up from a chair using your arms (e.g., wheelchair or bedside chair)?: A Little Help needed to walk in hospital room?: A Little Help needed climbing 3-5 steps with a railing? : A Little 6 Click Score: 20    End of Session   Activity Tolerance: Patient tolerated treatment well;Treatment limited secondary to agitation Patient left: in bed;with nursing/sitter in room Nurse Communication: Mobility status PT Visit Diagnosis: Other abnormalities of gait and mobility (R26.89);Difficulty in walking, not elsewhere classified (R26.2);Muscle weakness (generalized)  (M62.81)    Time: 1914-7829 PT Time Calculation (min) (ACUTE ONLY): 11 min   Charges:   PT Evaluation $PT Eval Moderate Complexity: 1 Mod PT Treatments $Self Care/Home Management: 8-22 PT General Charges $$ ACUTE PT VISIT: 1 Visit       9:30 AM, 01/04/24 Dawn Eth, PT, DPT Physical Therapist - [facility]  [phone] (ASCOM)    Chancie Lampert C 01/04/2024, 9:22 AM

## 2024-01-05 LAB — SURGICAL PATHOLOGY

## 2024-01-06 ENCOUNTER — Encounter: Payer: Self-pay | Admitting: Family Medicine

## 2024-01-06 ENCOUNTER — Emergency Department

## 2024-01-06 ENCOUNTER — Inpatient Hospital Stay
Admission: EM | Admit: 2024-01-06 | Discharge: 2024-01-09 | DRG: 947 | Disposition: A | Discharge status: Home / Self Care | Attending: Student | Admitting: Student

## 2024-01-06 ENCOUNTER — Other Ambulatory Visit: Payer: Self-pay

## 2024-01-06 DIAGNOSIS — I10 Essential (primary) hypertension: Secondary | ICD-10-CM | POA: Diagnosis present

## 2024-01-06 DIAGNOSIS — B182 Chronic viral hepatitis C: Secondary | ICD-10-CM | POA: Diagnosis present

## 2024-01-06 DIAGNOSIS — K766 Portal hypertension: Secondary | ICD-10-CM | POA: Diagnosis present

## 2024-01-06 DIAGNOSIS — E876 Hypokalemia: Secondary | ICD-10-CM | POA: Diagnosis present

## 2024-01-06 DIAGNOSIS — Z79899 Other long term (current) drug therapy: Secondary | ICD-10-CM

## 2024-01-06 DIAGNOSIS — T879 Unspecified complications of amputation stump: Secondary | ICD-10-CM | POA: Diagnosis not present

## 2024-01-06 DIAGNOSIS — F1020 Alcohol dependence, uncomplicated: Secondary | ICD-10-CM | POA: Diagnosis present

## 2024-01-06 DIAGNOSIS — A419 Sepsis, unspecified organism: Secondary | ICD-10-CM

## 2024-01-06 DIAGNOSIS — K3189 Other diseases of stomach and duodenum: Secondary | ICD-10-CM | POA: Diagnosis present

## 2024-01-06 DIAGNOSIS — T8149XA Infection following a procedure, other surgical site, initial encounter: Secondary | ICD-10-CM

## 2024-01-06 DIAGNOSIS — T148XXA Other injury of unspecified body region, initial encounter: Principal | ICD-10-CM

## 2024-01-06 DIAGNOSIS — K7030 Alcoholic cirrhosis of liver without ascites: Secondary | ICD-10-CM | POA: Diagnosis present

## 2024-01-06 DIAGNOSIS — G8918 Other acute postprocedural pain: Principal | ICD-10-CM

## 2024-01-06 DIAGNOSIS — I8511 Secondary esophageal varices with bleeding: Secondary | ICD-10-CM | POA: Diagnosis present

## 2024-01-06 DIAGNOSIS — K2100 Gastro-esophageal reflux disease with esophagitis, without bleeding: Secondary | ICD-10-CM | POA: Diagnosis present

## 2024-01-06 DIAGNOSIS — D61818 Other pancytopenia: Secondary | ICD-10-CM | POA: Diagnosis present

## 2024-01-06 DIAGNOSIS — Z801 Family history of malignant neoplasm of trachea, bronchus and lung: Secondary | ICD-10-CM

## 2024-01-06 DIAGNOSIS — F17210 Nicotine dependence, cigarettes, uncomplicated: Secondary | ICD-10-CM | POA: Diagnosis present

## 2024-01-06 LAB — CBC WITH DIFFERENTIAL/PLATELET
Abs Immature Granulocytes: 0.02 10*3/uL (ref 0.00–0.07)
Basophils Absolute: 0 10*3/uL (ref 0.0–0.1)
Basophils Relative: 0 %
Eosinophils Absolute: 0 10*3/uL (ref 0.0–0.5)
Eosinophils Relative: 0 %
HCT: 27.7 % — ABNORMAL LOW (ref 39.0–52.0)
Hemoglobin: 8.8 g/dL — ABNORMAL LOW (ref 13.0–17.0)
Immature Granulocytes: 1 %
Lymphocytes Relative: 20 %
Lymphs Abs: 0.7 10*3/uL (ref 0.7–4.0)
MCH: 26.7 pg (ref 26.0–34.0)
MCHC: 31.8 g/dL (ref 30.0–36.0)
MCV: 84.2 fL (ref 80.0–100.0)
Monocytes Absolute: 0.3 10*3/uL (ref 0.1–1.0)
Monocytes Relative: 8 %
Neutro Abs: 2.6 10*3/uL (ref 1.7–7.7)
Neutrophils Relative %: 71 %
Platelets: 54 10*3/uL — ABNORMAL LOW (ref 150–400)
RBC: 3.29 MIL/uL — ABNORMAL LOW (ref 4.22–5.81)
RDW: 20.4 % — ABNORMAL HIGH (ref 11.5–15.5)
WBC: 3.6 10*3/uL — ABNORMAL LOW (ref 4.0–10.5)
nRBC: 0 % (ref 0.0–0.2)

## 2024-01-06 LAB — BASIC METABOLIC PANEL WITH GFR
Anion gap: 9 (ref 5–15)
BUN: 11 mg/dL (ref 6–20)
CO2: 20 mmol/L — ABNORMAL LOW (ref 22–32)
Calcium: 8.1 mg/dL — ABNORMAL LOW (ref 8.9–10.3)
Chloride: 109 mmol/L (ref 98–111)
Creatinine, Ser: 0.76 mg/dL (ref 0.61–1.24)
GFR, Estimated: >60 mL/min (ref >60)
Glucose, Bld: 104 mg/dL — ABNORMAL HIGH (ref 70–99)
Potassium: 3.2 mmol/L — ABNORMAL LOW (ref 3.5–5.1)
Sodium: 138 mmol/L (ref 135–145)

## 2024-01-06 LAB — LACTIC ACID, PLASMA
Lactic Acid, Venous: 2.5 mmol/L (ref 0.5–1.9)
Lactic Acid, Venous: 1.7 mmol/L (ref 0.5–1.9)

## 2024-01-06 LAB — MRSA NEXT GEN BY PCR, NASAL: MRSA by PCR Next Gen: NOT DETECTED

## 2024-01-06 MED ORDER — METRONIDAZOLE 500 MG/100ML IV SOLN
500.0000 mg | Freq: Once | INTRAVENOUS | Status: AC
  Administered 2024-01-06: 500 mg via INTRAVENOUS
  Filled 2024-01-06: qty 100

## 2024-01-06 MED ORDER — SODIUM CHLORIDE 0.9 % IV BOLUS (SEPSIS): 1000.0000 mL | Freq: Once | INTRAVENOUS | Status: DC

## 2024-01-06 MED ORDER — ADULT MULTIVITAMIN W/MINERALS CH
1.0000 | ORAL_TABLET | Freq: Every day | ORAL | Status: DC
  Administered 2024-01-07 – 2024-01-09 (×3): 1 via ORAL
  Filled 2024-01-06 (×3): qty 1

## 2024-01-06 MED ORDER — RISAQUAD PO CAPS
1.0000 | ORAL_CAPSULE | Freq: Two times a day (BID) | ORAL | Status: DC
  Administered 2024-01-06 – 2024-01-09 (×7): 1 via ORAL
  Filled 2024-01-06 (×7): qty 1

## 2024-01-06 MED ORDER — POLYETHYLENE GLYCOL 3350 17 GM/SCOOP PO POWD
17.0000 g | Freq: Every day | ORAL | Status: DC
  Filled 2024-01-06: qty 119

## 2024-01-06 MED ORDER — MORPHINE SULFATE (PF) 4 MG/ML IV SOLN
4.0000 mg | Freq: Once | INTRAVENOUS | Status: AC
  Administered 2024-01-06: 4 mg via INTRAVENOUS
  Filled 2024-01-06: qty 1

## 2024-01-06 MED ORDER — THIAMINE HCL 100 MG PO TABS
100.0000 mg | ORAL_TABLET | Freq: Every day | ORAL | Status: DC
  Administered 2024-01-06 – 2024-01-09 (×4): 100 mg via ORAL
  Filled 2024-01-06 (×8): qty 1

## 2024-01-06 MED ORDER — POLYETHYLENE GLYCOL 3350 17 G PO PACK: 17.0000 g | PACK | Freq: Every day | ORAL | Status: DC | PRN

## 2024-01-06 MED ORDER — LORAZEPAM 2 MG/ML IJ SOLN: 1.0000 mg | INTRAMUSCULAR | Status: AC | PRN

## 2024-01-06 MED ORDER — SODIUM CHLORIDE 0.9 % IV BOLUS (SEPSIS)
1000.0000 mL | Freq: Once | INTRAVENOUS | Status: AC
  Administered 2024-01-06: 1000 mL via INTRAVENOUS

## 2024-01-06 MED ORDER — VANCOMYCIN HCL IN DEXTROSE 1-5 GM/200ML-% IV SOLN
1000.0000 mg | Freq: Once | INTRAVENOUS | Status: AC
  Administered 2024-01-06: 1000 mg via INTRAVENOUS
  Filled 2024-01-06: qty 200

## 2024-01-06 MED ORDER — SODIUM CHLORIDE 0.9 % IV BOLUS
1000.0000 mL | Freq: Once | INTRAVENOUS | Status: AC
  Administered 2024-01-06: 1000 mL via INTRAVENOUS

## 2024-01-06 MED ORDER — FOLIC ACID 1 MG PO TABS: 1.0000 mg | ORAL_TABLET | Freq: Every day | ORAL | Status: DC

## 2024-01-06 MED ORDER — ONDANSETRON HCL 4 MG/2ML IJ SOLN
4.0000 mg | Freq: Once | INTRAMUSCULAR | Status: AC
  Administered 2024-01-06: 4 mg via INTRAVENOUS
  Filled 2024-01-06: qty 2

## 2024-01-06 MED ORDER — OXYCODONE HCL 5 MG PO TABS
5.0000 mg | ORAL_TABLET | Freq: Four times a day (QID) | ORAL | Status: DC | PRN
  Administered 2024-01-06 – 2024-01-09 (×6): 5 mg via ORAL
  Filled 2024-01-06 (×6): qty 1

## 2024-01-06 MED ORDER — LORAZEPAM 2 MG/ML IJ SOLN
0.0000 mg | Freq: Four times a day (QID) | INTRAMUSCULAR | Status: AC
  Administered 2024-01-06: 2 mg via INTRAVENOUS
  Filled 2024-01-06: qty 1

## 2024-01-06 MED ORDER — THIAMINE MONONITRATE 100 MG PO TABS: 100.0000 mg | ORAL_TABLET | Freq: Every day | ORAL | Status: DC

## 2024-01-06 MED ORDER — PANTOPRAZOLE SODIUM 40 MG PO TBEC
40.0000 mg | DELAYED_RELEASE_TABLET | Freq: Every day | ORAL | Status: DC
  Administered 2024-01-06 – 2024-01-08 (×3): 40 mg via ORAL
  Filled 2024-01-06 (×3): qty 1

## 2024-01-06 MED ORDER — HYDROMORPHONE HCL 1 MG/ML IJ SOLN
0.5000 mg | INTRAMUSCULAR | Status: DC | PRN
  Administered 2024-01-06 – 2024-01-09 (×7): 0.5 mg via INTRAVENOUS
  Filled 2024-01-06 (×7): qty 0.5

## 2024-01-06 MED ORDER — LOPERAMIDE HCL 2 MG PO CAPS: 2.0000 mg | ORAL_CAPSULE | ORAL | Status: DC | PRN

## 2024-01-06 MED ORDER — SODIUM CHLORIDE 0.9 % IV SOLN
2.0000 g | Freq: Once | INTRAVENOUS | Status: AC
  Administered 2024-01-06: 2 g via INTRAVENOUS
  Filled 2024-01-06: qty 12.5

## 2024-01-06 MED ORDER — ONDANSETRON HCL 4 MG PO TABS
4.0000 mg | ORAL_TABLET | Freq: Four times a day (QID) | ORAL | Status: DC | PRN
Start: 2024-01-06 — End: 2024-01-09

## 2024-01-06 MED ORDER — SODIUM CHLORIDE 0.9 % IV SOLN
100.0000 mg | Freq: Two times a day (BID) | INTRAVENOUS | Status: DC
  Administered 2024-01-06 – 2024-01-08 (×5): 100 mg via INTRAVENOUS
  Filled 2024-01-06 (×5): qty 100

## 2024-01-06 MED ORDER — LORAZEPAM 1 MG PO TABS
1.0000 mg | ORAL_TABLET | ORAL | Status: AC | PRN
  Administered 2024-01-06 – 2024-01-08 (×5): 1 mg via ORAL
  Filled 2024-01-06 (×6): qty 1

## 2024-01-06 MED ORDER — OXYCODONE HCL 5 MG PO TABS: 5.0000 mg | ORAL_TABLET | Freq: Four times a day (QID) | ORAL | Status: DC | PRN

## 2024-01-06 MED ORDER — ONDANSETRON HCL 4 MG/2ML IJ SOLN
4.0000 mg | Freq: Four times a day (QID) | INTRAMUSCULAR | Status: DC | PRN
Start: 2024-01-06 — End: 2024-01-09

## 2024-01-06 MED ORDER — FOLIC ACID 1 MG PO TABS
1.0000 mg | ORAL_TABLET | Freq: Every day | ORAL | Status: DC
  Administered 2024-01-06 – 2024-01-09 (×4): 1 mg via ORAL
  Filled 2024-01-06 (×4): qty 1

## 2024-01-06 NOTE — ED Provider Notes (Signed)
 [facility] Provider Note    Event Date/Time   First MD Initiated Contact with Patient 01/06/24 [phone]     (approximate)   History   Foot Injury   HPI  Derrick Blanchard is a 43 y.o. male brought to the ED via EMS from local parking lot with a chief complaint of postoperative complication.  Patient recently hospitalized from 4/23 - 01/04/2024 for wound dehiscence, ultimately requiring left TMA (manage metatarsal amputation) on 01/03/2024 due to bearing weight immediately in the postoperative period.  Treated with Rocephin , Flagyl , Zyvox  for Enterococcus from the wound.  History of alcohol use disorder, cirrhosis, hypertension, substance abuse.  Patient ambulating on affected postop site, arrives with bleeding soaked through his bandages.  Endorses chills and nausea.  Denies fever, chest pain, shortness of breath, abdominal pain, vomiting or dizziness.     Past Medical History   Past Medical History:  Diagnosis Date   Anemia    Anxiety    Cirrhosis (HCC)    Depression    Heart murmur    Hepatitis    Hypertension    Substance abuse [facility])      Active Problem List   Patient Active Problem List   Diagnosis Date Noted   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) 01/03/2024   Infection of toe as complication of amputation (HCC) 12/27/2023   Wound of left foot 12/13/2023   Open wound of left lower extremity 12/13/2023   ETOH abuse 12/13/2023   Pyogenic inflammation of bone (HCC) 12/13/2023   Hypomagnesemia 03/13/2023   Diarrhea 03/12/2023   Severe sepsis (HCC) 03/11/2023   Alcoholic cirrhosis of liver (HCC) 03/11/2023   Chronic hepatitis C (HCC) 03/11/2023   Cellulitis of left lower extremity 03/11/2023   Ulcer of left foot with muscle involvement without evidence of necrosis (HCC) 03/11/2023   Nausea and vomiting 03/11/2023   Portal hypertension with esophageal varices (HCC) 11/27/2019   Thrombocytopenia, acquired (HCC) 11/27/2019    GERD (gastroesophageal reflux disease) 11/24/2017   Tachycardia 11/24/2017   Hypokalemia 10/25/2017   Thrombocytopenia (HCC) 10/25/2017   Advance care planning 10/25/2017   Depression, major, single episode, moderate (HCC) 10/25/2017   Elevated liver enzymes 09/28/2017   Hyperbilirubinemia    Alcohol use disorder, severe, dependence (HCC) 02/03/2015   Alcoholic hepatitis 01/30/2015   Hematochezia 01/30/2015   Coagulopathy (HCC) 01/30/2015   Hyponatremia 01/30/2015   Pancytopenia (HCC) 01/30/2015     Past Surgical History   Past Surgical History:  Procedure Laterality Date   AMPUTATION Left 12/29/2023   Procedure: AMPUTATION, FOOT, RAY;  Surgeon: Evertt Hoe, DPM;  Location: [facility] ORS;  Service: Orthopedics/Podiatry;  Laterality: Left;  Partial first ray amp, abx beads, graft application   COLONOSCOPY     ESOPHAGOGASTRODUODENOSCOPY (EGD) WITH PROPOFOL  N/A 02/03/2015   Procedure: ESOPHAGOGASTRODUODENOSCOPY (EGD) WITH PROPOFOL ;  Surgeon: Luella Sager, MD;  Location: [facility] ENDOSCOPY;  Service: Endoscopy;  Laterality: N/A;   ESOPHAGOGASTRODUODENOSCOPY (EGD) WITH PROPOFOL  N/A 12/21/2017   Procedure: ESOPHAGOGASTRODUODENOSCOPY (EGD) WITH PROPOFOL ;  Surgeon: Luke Salaam, MD;  Location: [facility] ENDOSCOPY;  Service: Gastroenterology;  Laterality: N/A;   FISSURECTOMY     IRRIGATION AND DEBRIDEMENT FOOT Left 12/16/2023   Procedure: IRRIGATION AND DEBRIDEMENT FOOT, possible sesamoidectomy, bone biopsy;  Surgeon: Reina Cara, DPM;  Location: [facility] ORS;  Service: Podiatry;  Laterality: Left;  Pulse lavage, bone biopsy with jam shidi needle, mini c arm, sagittal saw available   TRANSMETATARSAL AMPUTATION Left 01/03/2024   Procedure: AMPUTATION,  FOOT, TRANSMETATARSAL;  Surgeon: Evertt Hoe, DPM;  Location: [facility] ORS;  Service: Orthopedics/Podiatry;  Laterality: Left;  Left foot Trans met amputation     Home Medications   Prior to Admission medications   Medication Sig  Start Date End Date Taking? Authorizing Provider  folic acid  (FOLVITE ) 1 MG tablet Take 1 tablet (1 mg total) by mouth daily. 12/21/23   Alexander, Natalie, DO  linezolid  (ZYVOX ) 600 MG tablet Take 1 tablet (600 mg total) by mouth every 12 (twelve) hours for 5 days. 01/04/24 01/09/24  Dezii, Alexandra, DO  Multiple Vitamin (MULTIVITAMIN WITH MINERALS) TABS tablet Take 1 tablet by mouth daily. 12/22/23   Alexander, Natalie, DO  oxyCODONE  (OXY IR/ROXICODONE ) 5 MG immediate release tablet Take 1 tablet (5 mg total) by mouth every 6 (six) hours as needed for up to 5 days for severe pain (pain score 7-10) (for pain not relieved by tylenol  alone.). 01/04/24 01/09/24  Dezii, Alexandra, DO  pantoprazole  (PROTONIX ) 40 MG tablet Take 1 tablet (40 mg total) by mouth daily. 12/21/23   Alexander, Natalie, DO  polyethylene glycol powder (GLYCOLAX /MIRALAX ) 17 GM/SCOOP powder Take 17 g by mouth daily. 01/04/24   Dezii, Alexandra, DO  thiamine  (VITAMIN B-1) 100 MG tablet Take 1 tablet (100 mg total) by mouth daily. 12/21/23   Alexander, Natalie, DO     Allergies  Patient has no known allergies.   Family History   Family History  Problem Relation Age of Onset   Lung cancer Mother    Cirrhosis Father    Lung cancer Paternal Grandfather      Physical Exam  Triage Vital Signs: ED Triage Vitals  Encounter Vitals Group     BP 01/06/24 0310 95/74     Systolic BP Percentile --      Diastolic BP Percentile --      Pulse Rate 01/06/24 0310 98     Resp 01/06/24 0310 18     Temp 01/06/24 0310 98.4 F (36.9 C)     Temp Source 01/06/24 0310 Oral     SpO2 01/06/24 0310 100 %     Weight 01/06/24 0312 218 lb 4.1 oz (99 kg)     Height 01/06/24 0312 5\' 8"  (1.727 m)     Head Circumference --      Peak Flow --      Pain Score 01/06/24 0311 9     Pain Loc --      Pain Education --      Exclude from Growth Chart --     Updated Vital Signs: BP 104/62   Pulse 98   Temp 98.4 F (36.9 C) (Oral)   Resp 18   Ht 5\' 8"   (1.727 m)   Wt 99 kg   SpO2 100%   BMI 33.19 kg/m    General: Awake, mild distress.  Disheveled. CV:  RRR.  Good peripheral perfusion.  Resp:  Normal effort.  CTAB. Abd:  Nontender.  No distention.  Other:  Left TMA operative site: Bleeding through multiple layers of Kerlix and Ace bandage which were removed.  No active bleeding from post amputation site.  Area is swollen and fluctuant, warm to the touch.  Palpable distal pulses.    ED Results / Procedures / Treatments  Labs (all labs ordered are listed, but only abnormal results are displayed) Labs Reviewed  LACTIC ACID, PLASMA - Abnormal; Notable for the following components:      Result Value   Lactic Acid, Venous 2.5 (*)  All other components within normal limits  CBC WITH DIFFERENTIAL/PLATELET - Abnormal; Notable for the following components:   WBC 3.6 (*)    RBC 3.29 (*)    Hemoglobin 8.8 (*)    HCT 27.7 (*)    RDW 20.4 (*)    Platelets 54 (*)    All other components within normal limits  BASIC METABOLIC PANEL WITH GFR - Abnormal; Notable for the following components:   Potassium 3.2 (*)    CO2 20 (*)    Glucose, Bld 104 (*)    Calcium  8.1 (*)    All other components within normal limits  CULTURE, BLOOD (ROUTINE X 2)  CULTURE, BLOOD (ROUTINE X 2)  LACTIC ACID, PLASMA     EKG  None   RADIOLOGY I have independently visualized interpreted patient's imaging study as well as noted the radiology interpretation:  Left foot: no acute process  Official radiology report(s): DG Foot Complete Left Result Date: 01/06/2024 CLINICAL DATA:  Status post amputation with bleeding wound, initial encounter EXAM: LEFT FOOT - COMPLETE 3+ VIEW COMPARISON:  01/03/2024 FINDINGS: Changes consistent with transmetatarsal amputation are seen. Surgical staples are noted in place. No erosive changes are noted. Previously seen subcutaneous air has resolved. IMPRESSION: Changes consistent with transmetatarsal amputation. No acute bony  abnormality is noted. Electronically Signed   By: Violeta Grey M.D.   On: 01/06/2024 03:54     PROCEDURES:  Critical Care performed: Yes, see critical care procedure note(s)  CRITICAL CARE Performed by: Norlene Beavers   Total critical care time: 30 minutes  Critical care time was exclusive of separately billable procedures and treating other patients.  Critical care was necessary to treat or prevent imminent or life-threatening deterioration.  Critical care was time spent personally by me on the following activities: development of treatment plan with patient and/or surrogate as well as nursing, discussions with consultants, evaluation of patient's response to treatment, examination of patient, obtaining history from patient or surrogate, ordering and performing treatments and interventions, ordering and review of laboratory studies, ordering and review of radiographic studies, pulse oximetry and re-evaluation of patient's condition.   Aaron Aas1-3 Lead EKG Interpretation  Performed by: Norlene Beavers, MD Authorized by: Norlene Beavers, MD     Interpretation: normal     ECG rate:  95   ECG rate assessment: normal     Rhythm: sinus rhythm     Ectopy: none     Conduction: normal   Comments:     Patient placed on cardiac monitor to evaluate for arrhythmias    MEDICATIONS ORDERED IN ED: Medications  thiamine  (VITAMIN B1) tablet 100 mg (has no administration in time range)  LORazepam  (ATIVAN ) injection 0-4 mg (has no administration in time range)  sodium chloride  0.9 % bolus 1,000 mL (has no administration in time range)    And  sodium chloride  0.9 % bolus 1,000 mL (has no administration in time range)    And  sodium chloride  0.9 % bolus 1,000 mL (has no administration in time range)  ceFEPIme  (MAXIPIME ) 2 g in sodium chloride  0.9 % 100 mL IVPB (has no administration in time range)  metroNIDAZOLE  (FLAGYL ) IVPB 500 mg (has no administration in time range)  vancomycin  (VANCOCIN ) IVPB 1000  mg/200 mL premix (has no administration in time range)  sodium chloride  0.9 % bolus 1,000 mL (1,000 mLs Intravenous New Bag/Given 01/06/24 0332)  ondansetron  (ZOFRAN ) injection 4 mg (4 mg Intravenous Given 01/06/24 0333)  morphine  (PF) 4 MG/ML injection 4  mg (4 mg Intravenous Given 01/06/24 0334)     IMPRESSION / MDM / ASSESSMENT AND PLAN / ED COURSE  I reviewed the triage vital signs and the nursing notes.                             43 year old male presenting with postoperative bleeding.  Differential diagnosis includes but is not limited to hematoma, seroma, wound infection, etc.  I personally reviewed patient's records and note his recent hospitalization and surgical note 4/23-01/04/2024.  Patient's presentation is most consistent with acute complicated illness / injury requiring diagnostic workup.  The patient is on the cardiac monitor to evaluate for evidence of arrhythmia and/or significant heart rate changes.  Will obtain lab work, x-ray.  Keep NPO, initiate IV fluids, IV Morphine  for pain.  Will discuss with podiatry once lab/imaging data are back.  Clinical Course as of 01/06/24 0430  Sat Jan 06, 2024  0422 Lactic acid 2.5.  X-ray unremarkable.  Baseline WBC and platelets.  Discussed with podiatry Dr. Rosemarie Conquest; likely hematoma. Patient ultimately likely to have BKA. Will administer IV antibiotics and consult hospitalist services for evaluation and admission. [JS]    Clinical Course User Index [JS] Norlene Beavers, MD     FINAL CLINICAL IMPRESSION(S) / ED DIAGNOSES   Final diagnoses:  Bleeding from wound  Post-operative pain  Sepsis, due to unspecified organism, unspecified whether acute organ dysfunction present [facility])  Wound infection after surgery     Rx / DC Orders   ED Discharge Orders     None        Note:  This document was prepared using Dragon voice recognition software and may include unintentional dictation errors.   Matilynn Dacey J, MD 01/06/24 [phone]

## 2024-01-06 NOTE — Progress Notes (Signed)
 CODE SEPSIS - PHARMACY COMMUNICATION  **Broad Spectrum Antibiotics should be administered within 1 hour of Sepsis diagnosis**  Time Code Sepsis Called/Page Received: 0425  Antibiotics Ordered: Cefepime , Flagyl , Vancomycin   Time of 1st antibiotic administration: 8119  Derrick Blanchard, PharmD, [facility] 01/06/2024 4:34 AM

## 2024-01-06 NOTE — Progress Notes (Signed)
 Pt being followed by [facility] for Sepsis protocol.

## 2024-01-06 NOTE — H&P (Signed)
 History and Physical    Derrick Blanchard ZOX:[id number] DOB: 09/25/1980 DOA: 01/06/2024  PCP: Pcp, No (Confirm with patient/family/NH records and if not entered, this has to be entered at [facility] point of entry) Patient coming from: Home  I have personally briefly reviewed patient's old medical records in [facility] Link  Chief Complaint: Left foot pain  HPI: Derrick Blanchard is a 43 y.o. male with medical history significant of alcoholic cirrhosis, chronic thrombocytopenia, hypertension, who was just recently hospitalized for left foot first ray amputation secondary to nonhealing wound and wound dehiscence, coming back for bleeding and pain from the surgical site.  Patient was recently hospitalized for dehiscence of a left foot surgical wound, underwent left-sided transmetatarsal foot ablation, bone biopsy during surgery showed no osteomyelitis and wound culture was negative.  Patient sent home with p.o. Zyvox .  Patient was sent home on [facility] boot to offload pressure from left foot surgical site however appears that patient has not been using the foot wet right and caused pressure-point to the surgical wound and started to have bleeding since yesterday became worse overnight, able to complaint severe pain 10/10 of the surgical stump site.  Patient also reported that he has been having loose diarrhea 2-3 times a day with abdominal cramping for the last 2-3 days, denies any fever or chills, no tenesmus. ED Course: Afebrile, nontachycardic blood work showed hemoglobin 8.8 WBC 7.6 platelet 54, K3.2 BUN 11 creatinine 0.7.  X-ray of the left foot showed no gas or foreign body.  Review of Systems: As per HPI otherwise 14 point review of systems negative.    Past Medical History:  Diagnosis Date   Anemia    Anxiety    Cirrhosis (HCC)    Depression    Heart murmur    Hepatitis    Hypertension    Substance abuse [facility])     Past Surgical History:  Procedure Laterality Date   AMPUTATION Left  12/29/2023   Procedure: AMPUTATION, FOOT, RAY;  Surgeon: Evertt Hoe, DPM;  Location: [facility] ORS;  Service: Orthopedics/Podiatry;  Laterality: Left;  Partial first ray amp, abx beads, graft application   COLONOSCOPY     ESOPHAGOGASTRODUODENOSCOPY (EGD) WITH PROPOFOL  N/A 02/03/2015   Procedure: ESOPHAGOGASTRODUODENOSCOPY (EGD) WITH PROPOFOL ;  Surgeon: Luella Sager, MD;  Location: [facility] ENDOSCOPY;  Service: Endoscopy;  Laterality: N/A;   ESOPHAGOGASTRODUODENOSCOPY (EGD) WITH PROPOFOL  N/A 12/21/2017   Procedure: ESOPHAGOGASTRODUODENOSCOPY (EGD) WITH PROPOFOL ;  Surgeon: Luke Salaam, MD;  Location: [facility] ENDOSCOPY;  Service: Gastroenterology;  Laterality: N/A;   FISSURECTOMY     IRRIGATION AND DEBRIDEMENT FOOT Left 12/16/2023   Procedure: IRRIGATION AND DEBRIDEMENT FOOT, possible sesamoidectomy, bone biopsy;  Surgeon: Reina Cara, DPM;  Location: [facility] ORS;  Service: Podiatry;  Laterality: Left;  Pulse lavage, bone biopsy with jam shidi needle, mini c arm, sagittal saw available   TRANSMETATARSAL AMPUTATION Left 01/03/2024   Procedure: AMPUTATION, FOOT, TRANSMETATARSAL;  Surgeon: Evertt Hoe, DPM;  Location: [facility] ORS;  Service: Orthopedics/Podiatry;  Laterality: Left;  Left foot Trans met amputation     reports that he has been smoking cigarettes. He has never used smokeless tobacco. He reports that he does not currently use alcohol. He reports current drug use. Drug: Marijuana.  No Known Allergies  Family History  Problem Relation Age of Onset   Lung cancer Mother    Cirrhosis Father    Lung cancer Paternal Grandfather      Prior to Admission medications   Medication Sig Start Date  End Date Taking? Authorizing Provider  folic acid  (FOLVITE ) 1 MG tablet Take 1 tablet (1 mg total) by mouth daily. 12/21/23   Alexander, Natalie, DO  linezolid  (ZYVOX ) 600 MG tablet Take 1 tablet (600 mg total) by mouth every 12 (twelve) hours for 5 days. 01/04/24 01/09/24  Dezii, Alexandra, DO   Multiple Vitamin (MULTIVITAMIN WITH MINERALS) TABS tablet Take 1 tablet by mouth daily. 12/22/23   Alexander, Natalie, DO  oxyCODONE  (OXY IR/ROXICODONE ) 5 MG immediate release tablet Take 1 tablet (5 mg total) by mouth every 6 (six) hours as needed for up to 5 days for severe pain (pain score 7-10) (for pain not relieved by tylenol  alone.). 01/04/24 01/09/24  Dezii, Alexandra, DO  pantoprazole  (PROTONIX ) 40 MG tablet Take 1 tablet (40 mg total) by mouth daily. 12/21/23   Alexander, Natalie, DO  polyethylene glycol powder (GLYCOLAX /MIRALAX ) 17 GM/SCOOP powder Take 17 g by mouth daily. 01/04/24   Dezii, Alexandra, DO  thiamine  (VITAMIN B-1) 100 MG tablet Take 1 tablet (100 mg total) by mouth daily. 12/21/23   Melodi Sprung, DO    Physical Exam: Vitals:   01/06/24 0708 01/06/24 0720 01/06/24 0724 01/06/24 0737  BP: 98/79 98/79    Pulse: 75 80    Resp: 18     Temp:    98.6 F (37 C)  TempSrc:    Oral  SpO2: 100%  100%   Weight:      Height:        Constitutional: NAD, calm, comfortable Vitals:   01/06/24 0708 01/06/24 0720 01/06/24 0724 01/06/24 0737  BP: 98/79 98/79    Pulse: 75 80    Resp: 18     Temp:    98.6 F (37 C)  TempSrc:    Oral  SpO2: 100%  100%   Weight:      Height:       Eyes: PERRL, lids and conjunctivae normal ENMT: Mucous membranes are moist. Posterior pharynx clear of any exudate or lesions.Normal dentition.  Neck: normal, supple, no masses, no thyromegaly Respiratory: clear to auscultation bilaterally, no wheezing, no crackles. Normal respiratory effort. No accessory muscle use.  Cardiovascular: Regular rate and rhythm, no murmurs / rubs / gallops. No extremity edema. 2+ pedal pulses. No carotid bruits.  Abdomen: no tenderness, no masses palpated. No hepatosplenomegaly. Bowel sounds positive.  Musculoskeletal: no clubbing / cyanosis. No joint deformity upper and lower extremities. Good ROM, no contractures. Normal muscle tone.  Skin: no rashes, lesions, ulcers.  No induration, rash across the left foot amputation stump along the surgical incision site but no significant bleeding or discharges. Neurologic: CN 2-12 grossly intact. Sensation intact, DTR normal. Strength 5/5 in all 4.  Psychiatric: Normal judgment and insight. Alert and oriented x 3. Normal mood.     Labs on Admission: I have personally reviewed following labs and imaging studies  CBC: Recent Labs  Lab 01/01/24 0523 01/02/24 0341 01/03/24 0720 01/04/24 0517 01/06/24 0318  WBC 3.0* 1.8* 1.7* 3.6* 3.6*  NEUTROABS  --   --   --   --  2.6  HGB 8.5* 7.9* 7.7* 9.1* 8.8*  HCT 27.2* 25.0* 23.8* 29.3* 27.7*  MCV 83.2 83.3 81.0 84.0 84.2  PLT 65* 57* 53* 61* 54*   Basic Metabolic Panel: Recent Labs  Lab 12/31/23 0427 01/01/24 0523 01/02/24 0341 01/03/24 0346 01/04/24 0517 01/06/24 0318  NA 132* 135 137 135 137 138  K 3.5 3.5 3.9 3.7 4.2 3.2*  CL 103 105 108 106 108  109  CO2 24 24 24  20* 21* 20*  GLUCOSE 117* 101* 103* 101* 128* 104*  BUN 6 10 10 10 13 11   CREATININE 0.64 0.71 0.57* 0.67 0.56* 0.76  CALCIUM  7.8* 7.8* 8.3* 7.9* 8.6* 8.1*  MG 1.6* 2.0 1.7 1.7 2.0  --   PHOS 2.3* 3.3 3.5 3.2 2.3*  --    GFR: Estimated Creatinine Clearance: 139.9 mL/min (by C-G formula based on SCr of 0.76 mg/dL). Liver Function Tests: Recent Labs  Lab 01/02/24 0341 01/03/24 0346 01/04/24 0517  ALBUMIN 2.8* 2.4* 3.1*   No results for input(s): "LIPASE", "AMYLASE" in the last 168 hours. No results for input(s): "AMMONIA" in the last 168 hours. Coagulation Profile: No results for input(s): "INR", "PROTIME" in the last 168 hours. Cardiac Enzymes: No results for input(s): "CKTOTAL", "CKMB", "CKMBINDEX", "TROPONINI" in the last 168 hours. BNP (last 3 results) No results for input(s): "PROBNP" in the last 8760 hours. HbA1C: No results for input(s): "HGBA1C" in the last 72 hours. CBG: Recent Labs  Lab 12/30/23 0846 12/30/23 1208  GLUCAP 112* 117*   Lipid Profile: No results for  input(s): "CHOL", "HDL", "LDLCALC", "TRIG", "CHOLHDL", "LDLDIRECT" in the last 72 hours. Thyroid Function Tests: No results for input(s): "TSH", "T4TOTAL", "FREET4", "T3FREE", "THYROIDAB" in the last 72 hours. Anemia Panel: No results for input(s): "VITAMINB12", "FOLATE", "FERRITIN", "TIBC", "IRON", "RETICCTPCT" in the last 72 hours. Urine analysis:    Component Value Date/Time   COLORURINE YELLOW (A) 03/11/2023 0003   APPEARANCEUR CLEAR (A) 03/11/2023 0003   APPEARANCEUR Clear 02/01/2013 1851   LABSPEC 1.006 03/11/2023 0003   LABSPEC 1.015 02/01/2013 1851   PHURINE 6.0 03/11/2023 0003   GLUCOSEU NEGATIVE 03/11/2023 0003   GLUCOSEU 50 mg/dL 16/06/9603 5409   HGBUR NEGATIVE 03/11/2023 0003   BILIRUBINUR NEGATIVE 03/11/2023 0003   BILIRUBINUR 2+ 02/01/2013 1851   KETONESUR NEGATIVE 03/11/2023 0003   PROTEINUR NEGATIVE 03/11/2023 0003   NITRITE NEGATIVE 03/11/2023 0003   LEUKOCYTESUR NEGATIVE 03/11/2023 0003   LEUKOCYTESUR Negative 02/01/2013 1851    Radiological Exams on Admission: DG Foot Complete Left Result Date: 01/06/2024 CLINICAL DATA:  Status post amputation with bleeding wound, initial encounter EXAM: LEFT FOOT - COMPLETE 3+ VIEW COMPARISON:  01/03/2024 FINDINGS: Changes consistent with transmetatarsal amputation are seen. Surgical staples are noted in place. No erosive changes are noted. Previously seen subcutaneous air has resolved. IMPRESSION: Changes consistent with transmetatarsal amputation. No acute bony abnormality is noted. Electronically Signed   By: Violeta Grey M.D.   On: 01/06/2024 03:54    EKG: None  Assessment/Plan Principal Problem:   Complication of foot amputation stump (HCC)  (please populate well all problems here in Problem List. (For example, if patient is on BP meds at home and you resume or decide to hold them, it is a problem that needs to be her. Same for CAD, COPD, HLD and so on)  Intractable left foot transmetatarsal stump site pain - Clinically  suspect some soft tissue injury from inappropriate footwear wearing and pressure injury.  Patient refused to go back to the [place].  Discussed with podiatry Dr. Feliciana Horn, who will get patient a post-op shoe.  Patient was educated about properly wearing a postop foot to avoid further foot injury.  Presents with altered examining the stump site and found there is no signs of infection.  Review of wound culture data from last admission and ablation showed no growth.  MRSA screen today negative.  Given that Zyvox  likely causing diarrhea, will switch antibiotic to  doxycycline .  Once patient get the proper postoperation boot, and surgical wound pain is controlled, likely can be discharged home tomorrow.  Diarrhea, acute - Likely secondary to Zyvox  - Given the nature of diarrhea, low suspicion for C. difficile colitis.  Hold off C. difficile study - Probiotics and Imodium as needed  Alcoholic cirrhosis - Stable  Alcohol abuse - No symptoms or signs of active withdrawal - CIWA protocol with as needed benzo  Pancytopenia - Secondary to cirrhosis, blood counts stable, no signs of bleeding    DVT prophylaxis: SCD Code Status: Full code Family Communication: None at present Disposition Plan: Expect less than 2 midnight hospital stay Consults called: Podiatry Admission status: MedSurg observation   [place] MD [facility] Pager [phone]  01/06/2024, 7:56 AM

## 2024-01-06 NOTE — Plan of Care (Signed)

## 2024-01-06 NOTE — ED Notes (Signed)
 Warm blanket and remote provided by this RN

## 2024-01-06 NOTE — Sepsis Progress Note (Signed)
 Notified bedside nurse of need to draw 2nd lactic acid.

## 2024-01-06 NOTE — Progress Notes (Signed)
 Subjective:  Patient ID: Derrick Blanchard, male    DOB: 02/06/1981,  MRN: [id number]  Chief Complaint  Patient presents with   Foot Injury    DOS: 01/03/2024 Procedure: Transmetatarsal amputation of left foot  43 y.o. male seen for post op check.   Patient seen in the emergency department this morning.  He was readmitted after calling EMS due to pain and bleeding into his dressing he was found walking around on the left lower extremity against recommendations.  He states he has significant pain in the left foot.  Review of Systems: Negative except as noted in the HPI. Denies N/V/F/Ch.   Objective:   Vitals:   01/06/24 0724 01/06/24 0737  BP:    Pulse:    Resp:    Temp:  98.6 F (37 C)  SpO2: 100%    Body mass index is 33.19 kg/m. Constitutional Well developed. Well nourished.  Vascular Foot warm and well perfused. Capillary refill normal to all digits.   No calf pain with palpation  Neurologic Normal speech. Oriented to person, place, and time. Epicritic sensation intact left foot  Dermatologic No dehiscence of the amputation site on the left foot.  Staples are intact.  There is very early mild necrosis of the incision laterally, however this is still appears viable at this time.  No active drainage.  No significant erythema of the amputation site      Orthopedic: Status post left TMA   Radiographs: Disarticulation of the first metatarsal and proximal transmet amp 2 through 5  Pathology: 4/30  1. Foot, amputation, left forefoot :       - SOFT TISSUE NECROSIS WITH UNDERLYING ACUTE OSTEOMYELITIS.       - PROXIMAL MARGIN OF THIRD TOE WITH CUT SURFACE OF BONE; NEGATIVE FOR       OSTEOMYELITIS.       - SEPARATE FRAGMENTS OF BONE WITH OSTEOMYELITIS.       - FIRST AND SECOND TOES SURGICALLY ABSENT.   1. Toe(s), amputation, left partial first ray :       - CHRONIC ULCERATION OF SOFT TISSUE WITH NECROSIS.       - UNDERLYING BONE WITH REACTIVE CHANGES.       -  PROXIMAL MARGIN WITH ARTICULAR SURFACE OF BONE; NEGATIVE FOR OSTEOMYELITIS.        2. Toe(s), amputation, left metatarsal proximal margin :       - BONE WITH FOCAL NON-SPECIFIC REACTIVE CHANGES.       - PROXIMAL MARGIN WITH CUT SURFACE OF BONE; NEGATIVE FOR OSTEOMYELITIS.   Micro: E. Faecium, VRE, O. Uretheralis  Assessment:   status post left foot TMA, readmitted due to concern for bleeding and pain from amputation site  Plan:  Patient was evaluated and treated and all questions answered.  POD # 3 s/p left foot TMA - No frank dehiscence of the surgical site on the left foot.  No evidence of discrete infection.  -His bleeding in the dressing was a result of weightbearing against recommendations in addition to his thrombocytopenia which predisposes him to drainage through the incision. -No evidence of frank infection in the left foot amputation site at this time -Maintain strict nonweightbearing left lower extremity.  Ordered postop shoe as he said he did not like the cam boot and does not have it with him -XR: Expected postoperative changes -WB Status: Nonweightbearing in postop shoe -reinforced that this is for protection only now for walking -Sutures: Remain intact 2 to 3 weeks. -Medications/ABX:  Antibiotics per primary recommend p.o. antibiotics from my standpoint x 10 days - Dressing remain clean dry and intact until follow-up in office in 1 week -Patient has been made aware several times that he is having pain due to edema in his left foot amputation site which is a result of him continuously walking on the left foot against my recommendations.  He is also been made aware that if he continues to walk on his left foot it will lead to necrosis/dehiscence of the surgical site which will result in need for below the knee amputation. -Possible psych eval would be beneficial in addition to social work consultation - Will sign off at this time, if patient remains admitted in 1 week we will  check in 01/12/2024        Maridee Shoemaker, DPM [facility] / [facility]

## 2024-01-06 NOTE — ED Triage Notes (Signed)
 Pt with recent amputation of toes, brought in for soiled dressing of amputation site. Pt endorses pain, diarrhea after discharge. Denies fever, chills, discomfort. Left leg and toe noted will swelling and mild discoloration.

## 2024-01-07 ENCOUNTER — Observation Stay

## 2024-01-07 DIAGNOSIS — K766 Portal hypertension: Secondary | ICD-10-CM | POA: Diagnosis present

## 2024-01-07 DIAGNOSIS — G8918 Other acute postprocedural pain: Secondary | ICD-10-CM | POA: Diagnosis present

## 2024-01-07 DIAGNOSIS — T879 Unspecified complications of amputation stump: Secondary | ICD-10-CM | POA: Diagnosis not present

## 2024-01-07 DIAGNOSIS — Z79899 Other long term (current) drug therapy: Secondary | ICD-10-CM | POA: Diagnosis not present

## 2024-01-07 DIAGNOSIS — F1020 Alcohol dependence, uncomplicated: Secondary | ICD-10-CM | POA: Diagnosis present

## 2024-01-07 DIAGNOSIS — K7030 Alcoholic cirrhosis of liver without ascites: Secondary | ICD-10-CM | POA: Diagnosis present

## 2024-01-07 DIAGNOSIS — I10 Essential (primary) hypertension: Secondary | ICD-10-CM | POA: Diagnosis present

## 2024-01-07 DIAGNOSIS — F17210 Nicotine dependence, cigarettes, uncomplicated: Secondary | ICD-10-CM | POA: Diagnosis present

## 2024-01-07 DIAGNOSIS — D61818 Other pancytopenia: Secondary | ICD-10-CM | POA: Diagnosis present

## 2024-01-07 DIAGNOSIS — I8511 Secondary esophageal varices with bleeding: Secondary | ICD-10-CM | POA: Diagnosis present

## 2024-01-07 DIAGNOSIS — Z801 Family history of malignant neoplasm of trachea, bronchus and lung: Secondary | ICD-10-CM | POA: Diagnosis not present

## 2024-01-07 DIAGNOSIS — K3189 Other diseases of stomach and duodenum: Secondary | ICD-10-CM | POA: Diagnosis present

## 2024-01-07 DIAGNOSIS — B182 Chronic viral hepatitis C: Secondary | ICD-10-CM | POA: Diagnosis present

## 2024-01-07 DIAGNOSIS — E876 Hypokalemia: Secondary | ICD-10-CM | POA: Diagnosis present

## 2024-01-07 DIAGNOSIS — K2100 Gastro-esophageal reflux disease with esophagitis, without bleeding: Secondary | ICD-10-CM | POA: Diagnosis present

## 2024-01-07 LAB — CBC WITH DIFFERENTIAL/PLATELET
Abs Immature Granulocytes: 0.01 10*3/uL (ref 0.00–0.07)
Abs Immature Granulocytes: 0.01 10*3/uL (ref 0.00–0.07)
Basophils Absolute: 0 10*3/uL (ref 0.0–0.1)
Basophils Absolute: 0 10*3/uL (ref 0.0–0.1)
Basophils Relative: 1 %
Basophils Relative: 1 %
Eosinophils Absolute: 0 10*3/uL (ref 0.0–0.5)
Eosinophils Absolute: 0 10*3/uL (ref 0.0–0.5)
Eosinophils Relative: 3 %
Eosinophils Relative: 2 %
HCT: 23.2 % — ABNORMAL LOW (ref 39.0–52.0)
HCT: 22.2 % — ABNORMAL LOW (ref 39.0–52.0)
Hemoglobin: 7.4 g/dL — ABNORMAL LOW (ref 13.0–17.0)
Hemoglobin: 7 g/dL — ABNORMAL LOW (ref 13.0–17.0)
Immature Granulocytes: 1 %
Immature Granulocytes: 1 %
Lymphocytes Relative: 28 %
Lymphocytes Relative: 25 %
Lymphs Abs: 0.3 10*3/uL — ABNORMAL LOW (ref 0.7–4.0)
Lymphs Abs: 0.3 10*3/uL — ABNORMAL LOW (ref 0.7–4.0)
MCH: 26.7 pg (ref 26.0–34.0)
MCH: 26.3 pg (ref 26.0–34.0)
MCHC: 31.9 g/dL (ref 30.0–36.0)
MCHC: 31.5 g/dL (ref 30.0–36.0)
MCV: 83.8 fL (ref 80.0–100.0)
MCV: 83.5 fL (ref 80.0–100.0)
Monocytes Absolute: 0.2 10*3/uL (ref 0.1–1.0)
Monocytes Absolute: 0.2 10*3/uL (ref 0.1–1.0)
Monocytes Relative: 21 %
Monocytes Relative: 18 %
Neutro Abs: 0.5 10*3/uL — ABNORMAL LOW (ref 1.7–7.7)
Neutro Abs: 0.7 10*3/uL — ABNORMAL LOW (ref 1.7–7.7)
Neutrophils Relative %: 46 %
Neutrophils Relative %: 53 %
Platelets: 32 10*3/uL — ABNORMAL LOW (ref 150–400)
Platelets: 32 10*3/uL — ABNORMAL LOW (ref 150–400)
RBC: 2.77 MIL/uL — ABNORMAL LOW (ref 4.22–5.81)
RBC: 2.66 MIL/uL — ABNORMAL LOW (ref 4.22–5.81)
RDW: 20.3 % — ABNORMAL HIGH (ref 11.5–15.5)
RDW: 20 % — ABNORMAL HIGH (ref 11.5–15.5)
Smear Review: DECREASED
Smear Review: DECREASED
WBC: 1.1 10*3/uL — CL (ref 4.0–10.5)
WBC: 1.3 10*3/uL — CL (ref 4.0–10.5)
nRBC: 0 % (ref 0.0–0.2)
nRBC: 0 % (ref 0.0–0.2)

## 2024-01-07 LAB — BASIC METABOLIC PANEL WITH GFR
Anion gap: 7 (ref 5–15)
BUN: 8 mg/dL (ref 6–20)
CO2: 22 mmol/L (ref 22–32)
Calcium: 7.6 mg/dL — ABNORMAL LOW (ref 8.9–10.3)
Chloride: 111 mmol/L (ref 98–111)
Creatinine, Ser: 0.52 mg/dL — ABNORMAL LOW (ref 0.61–1.24)
GFR, Estimated: >60 mL/min (ref >60)
Glucose, Bld: 106 mg/dL — ABNORMAL HIGH (ref 70–99)
Potassium: 3.2 mmol/L — ABNORMAL LOW (ref 3.5–5.1)
Sodium: 140 mmol/L (ref 135–145)

## 2024-01-07 LAB — CBC
HCT: 23.2 % — ABNORMAL LOW (ref 39.0–52.0)
Hemoglobin: 7.4 g/dL — ABNORMAL LOW (ref 13.0–17.0)
MCH: 26.7 pg (ref 26.0–34.0)
MCHC: 31.9 g/dL (ref 30.0–36.0)
MCV: 83.8 fL (ref 80.0–100.0)
Platelets: 31 10*3/uL — ABNORMAL LOW (ref 150–400)
RBC: 2.77 MIL/uL — ABNORMAL LOW (ref 4.22–5.81)
RDW: 20.1 % — ABNORMAL HIGH (ref 11.5–15.5)
WBC: 1.1 10*3/uL — CL (ref 4.0–10.5)
nRBC: 0 % (ref 0.0–0.2)

## 2024-01-07 LAB — IRON AND TIBC
Iron: 212 ug/dL — ABNORMAL HIGH (ref 45–182)
Saturation Ratios: 82 % — ABNORMAL HIGH (ref 17.9–39.5)
TIBC: 258 ug/dL (ref 250–450)
UIBC: 46 ug/dL

## 2024-01-07 LAB — FOLATE: Folate: 17.1 ng/mL (ref >5.9)

## 2024-01-07 LAB — MAGNESIUM: Magnesium: 1.4 mg/dL — ABNORMAL LOW (ref 1.7–2.4)

## 2024-01-07 LAB — VITAMIN B12: Vitamin B-12: 555 pg/mL (ref 180–914)

## 2024-01-07 LAB — PHOSPHORUS: Phosphorus: 3.2 mg/dL (ref 2.5–4.6)

## 2024-01-07 LAB — VITAMIN D 25 HYDROXY (VIT D DEFICIENCY, FRACTURES): Vit D, 25-Hydroxy: 40.32 ng/mL (ref 30–100)

## 2024-01-07 MED ORDER — SODIUM CHLORIDE 0.9% IV SOLUTION: Freq: Once | INTRAVENOUS | Status: AC

## 2024-01-07 MED ORDER — POTASSIUM CHLORIDE CRYS ER 20 MEQ PO TBCR
40.0000 meq | EXTENDED_RELEASE_TABLET | ORAL | Status: AC
Start: 2024-01-07 — End: 2024-01-07
  Administered 2024-01-07 (×2): 40 meq via ORAL
  Filled 2024-01-07 (×2): qty 2

## 2024-01-07 MED ORDER — MAGNESIUM SULFATE 4 GM/100ML IV SOLN
4.0000 g | Freq: Once | INTRAVENOUS | Status: AC
  Administered 2024-01-07: 4 g via INTRAVENOUS
  Filled 2024-01-07: qty 100

## 2024-01-07 NOTE — Plan of Care (Signed)

## 2024-01-07 NOTE — Progress Notes (Signed)
 [facility] Progress Note  Patient: Derrick Blanchard    ZOX:[id number]  DOA: 01/06/2024     Date of Service: the patient was seen and examined on 01/07/2024  Chief Complaint  Patient presents with   Foot Injury   Brief hospital course: Derrick Blanchard is a 43 y.o. male with medical history significant of alcoholic cirrhosis, chronic thrombocytopenia, hypertension, who was just recently hospitalized for left foot first ray amputation secondary to nonhealing wound and wound dehiscence, coming back for bleeding and pain from the surgical site.   Patient was recently hospitalized for dehiscence of a left foot surgical wound, underwent left-sided transmetatarsal foot ablation, bone biopsy during surgery showed no osteomyelitis and wound culture was negative.  Patient sent home with p.o. Zyvox .  Patient was sent home on [facility] boot to offload pressure from left foot surgical site however appears that patient has not been using the foot wet right and caused pressure-point to the surgical wound and started to have bleeding since yesterday became worse overnight, able to complaint severe pain 10/10 of the surgical stump site.  Patient also reported that he has been having loose diarrhea 2-3 times a day with abdominal cramping for the last 2-3 days, denies any fever or chills, no tenesmus. ED Course: Afebrile, nontachycardic blood work showed hemoglobin 8.8 WBC 7.6 platelet 54, K3.2 BUN 11 creatinine 0.7.  X-ray of the left foot showed no gas or foreign body.    Assessment and Plan:  Intractable left foot transmetatarsal stump site pain - Clinically suspect some soft tissue injury from inappropriate footwear wearing and pressure injury.  Patient refused to go back to the [place].   D/w podiatry Dr. Feliciana Horn, who will get patient a post-op shoe.  Patient was educated about properly wearing a postop foot to avoid further foot injury.  Presents with altered examining the stump site and found there  is no signs of infection.  Review of wound culture data from last admission and ablation showed no growth.  MRSA screen today negative.  Given that Zyvox  likely causing diarrhea,  switched antibiotic to doxycycline .  Once patient get the proper postoperation boot, and surgical wound pain is controlled, likely can be discharged home tomorrow.   Diarrhea, acute - Likely secondary to Zyvox  - Given the nature of diarrhea, low suspicion for C. difficile colitis.  Hold off C. difficile study - Probiotics and Imodium as needed   Hypokalemia, potassium repleted Hypomagnesemia, mag repleted Monitor electrolytes and replete as needed  Alcoholic cirrhosis - Stable   Alcohol abuse - No symptoms or signs of active withdrawal - CIWA protocol with as needed benzo   Pancytopenia - Secondary to cirrhosis, blood counts stable, no signs of bleeding 5/4 all cell count dropped today, will repeat CBC in the evening and added differential to rule out neutropenia Follow anemia workup  Hypocalcemia, check vitamin D level   Body mass index is 33 kg/m.  Interventions:  Diet: Regular diet DVT Prophylaxis: SCD, pharmacological prophylaxis contraindicated due to traumatic bleeding postop stump.    Advance goals of care discussion: Full code  Family Communication: family was not present at bedside, at the time of interview.  The pt provided permission to discuss medical plan with the family. Opportunity was given to ask question and all questions were answered satisfactorily.   Disposition:  Pt is from Home, admitted with Left TMA stump intractable pain and hematoma, diarrhea and electrolyte imbalance still has pain and diarrhea, electrolyte imbalance, which precludes a safe discharge.  Discharge to home, when stable, most likely tomorrow a.m.  Subjective: No significant events overnight, patient had diarrhea 3-4 episodes yesterday and 1 episode today, pain in the left foot is 7/10, no any other  complaints.  Physical Exam: General: NAD, lying comfortably Appear in no distress, affect appropriate Eyes: PERRLA ENT: Oral Mucosa Clear, moist  Neck: no JVD,  Cardiovascular: S1 and S2 Present, no Murmur,  Respiratory: good respiratory effort, Bilateral Air entry equal and Decreased, no Crackles, no wheezes Abdomen: Bowel Sound present, Soft and no tenderness,  Skin: no rashes Extremities: s/p Left foot TMA, dressing CDI, mild edema Neurologic: without any new focal findings Gait not checked due to patient safety concerns  Vitals:   01/06/24 2255 01/07/24 0218 01/07/24 0523 01/07/24 0739  BP: (!) 118/51 119/61 96/72 105/63  Pulse: 96 79 89 75  Resp: 17 18 18 16   Temp: 98.5 F (36.9 C) 98.5 F (36.9 C) 98.1 F (36.7 C) 98.2 F (36.8 C)  TempSrc:  Oral    SpO2: 99% 98% 100% 97%  Weight:      Height:        Intake/Output Summary (Last 24 hours) at 01/07/2024 1302 Last data filed at 01/07/2024 1022 Gross per 24 hour  Intake 371.35 ml  Output 1775 ml  Net -1403.65 ml   Filed Weights   01/06/24 0312 01/06/24 1111  Weight: 99 kg 97 kg    Data Reviewed: I have personally reviewed and interpreted daily labs, tele strips, imagings as discussed above. I reviewed all nursing notes, pharmacy notes, vitals, pertinent old records I have discussed plan of care as described above with RN and patient/family.  CBC: Recent Labs  Lab 01/02/24 0341 01/03/24 0720 01/04/24 0517 01/06/24 0318 01/07/24 0827  WBC 1.8* 1.7* 3.6* 3.6* 1.1*  NEUTROABS  --   --   --  2.6  --   HGB 7.9* 7.7* 9.1* 8.8* 7.4*  HCT 25.0* 23.8* 29.3* 27.7* 23.2*  MCV 83.3 81.0 84.0 84.2 83.8  PLT 57* 53* 61* 54* 31*   Basic Metabolic Panel: Recent Labs  Lab 01/01/24 0523 01/02/24 0341 01/03/24 0346 01/04/24 0517 01/06/24 0318 01/07/24 0827  NA 135 137 135 137 138 140  K 3.5 3.9 3.7 4.2 3.2* 3.2*  CL 105 108 106 108 109 111  CO2 24 24 20* 21* 20* 22  GLUCOSE 101* 103* 101* 128* 104* 106*  BUN 10  10 10 13 11 8   CREATININE 0.71 0.57* 0.67 0.56* 0.76 0.52*  CALCIUM  7.8* 8.3* 7.9* 8.6* 8.1* 7.6*  MG 2.0 1.7 1.7 2.0  --  1.4*  PHOS 3.3 3.5 3.2 2.3*  --  3.2    Studies: CT HEAD WO CONTRAST ( ) Result Date: 01/07/2024 CLINICAL DATA:  43 year old male status post recent lower extremity surgery. Wound dehiscence, pain. Coagulopathy, head trauma. EXAM: CT HEAD WITHOUT CONTRAST TECHNIQUE: Contiguous axial images were obtained from the base of the skull through the vertex without intravenous contrast. RADIATION DOSE REDUCTION: This exam was performed according to the departmental dose-optimization program which includes automated exposure control, adjustment of the mA and/or kV according to patient size and/or use of iterative reconstruction technique. COMPARISON:  Head CT 03/10/2023. FINDINGS: Brain: No midline shift, ventriculomegaly, mass effect, evidence of mass lesion, intracranial hemorrhage or evidence of cortically based acute infarction. Chronic cerebral volume loss for age appears generalized, not significantly changed from last year. Gray-white differentiation is stable and within limits. Vascular: No suspicious intracranial vascular hyperdensity. Skull: Stable, intact.  No  acute osseous abnormality identified. Sinuses/Orbits: Visualized paranasal sinuses and mastoids are stable and well aerated. Other: Leftward gaze. No acute orbit or scalp soft tissue injury identified. IMPRESSION: 1. No acute intracranial abnormality or acute traumatic injury identified. 2. Cerebral volume loss seems chronically advanced for age. Electronically Signed   By: Marlise Simpers M.D.   On: 01/07/2024 03:54    Scheduled Meds:  acidophilus  1 capsule Oral BID   folic acid   1 mg Oral Daily   LORazepam   0-4 mg Intravenous Q6H   multivitamin with minerals  1 tablet Oral Daily   pantoprazole   40 mg Oral Daily   potassium chloride   40 mEq Oral Q4H   thiamine   100 mg Oral Daily   Continuous Infusions:  doxycycline   (VIBRAMYCIN ) IV 100 mg (01/07/24 1022)   magnesium  sulfate bolus IVPB     PRN Meds: HYDROmorphone  (DILAUDID ) injection, loperamide, LORazepam  **OR** LORazepam , ondansetron  **OR** ondansetron  (ZOFRAN ) IV, oxyCODONE , polyethylene glycol  Time spent: 55 minutes  Author: Althia Atlas. MD [facility] 01/07/2024 1:02 PM  To reach On-call, see care teams to locate the attending and reach out to them via [email or web address]. If 7PM-7AM, please contact night-coverage If you still have difficulty reaching the attending provider, please page the [facility] (Director on Call) for [facility] on amion for assistance.

## 2024-01-07 NOTE — Progress Notes (Signed)
   01/07/24 0218  Vitals  Temp 98.5 F (36.9 C)  Temp Source Oral  BP 119/61  MAP (mmHg) 79  BP Location Right Arm  BP Method Automatic  Patient Position (if appropriate) Sitting  Pulse Rate 79  Pulse Rate Source Monitor  Resp 18  Level of Consciousness  Level of Consciousness Alert  MEWS COLOR  MEWS Score Color Green  Oxygen Therapy  SpO2 98 %  O2 Device Room Air  MEWS Score  MEWS Temp 0  MEWS Systolic 0  MEWS Pulse 0  MEWS RR 0  MEWS LOC 0  MEWS Score 0   At 0210a, staff entered this patient's room where he was found on the commode.  Patient stated that he fell forward, able to hold himself up with hands and pushed himself up, back onto commode. Pt stated he did not hit his head. Vitals stable, primary nurse assessed with no injuries, and provider and [facility] notified. Post fall huddle completed.

## 2024-01-07 NOTE — Significant Event (Signed)
       CROSS COVER NOTE  NAME: Derrick Blanchard MRN: [id number] DOB : February 19, 1981 ATTENDING PHYSICIAN: [place], MD    Date of Service   01/07/2024   HPI/Events of Note   Message receive by nuriPt admitted for complication of foot amputation stump with bilateral trans-metatarsal amputations. Has history of alcoholic cirrhosis, thrombocytopenia, and hypertension. Patient had a fall in bathroom, he's alert and says he didn't hit his head, and vitals are stable. Would you like any interventions?   Interventions   Assessment/Plan: Reports pain in foot, no edema. MAES no apparent areas of injury  CT head negative for acute findings Telesitter          Kip Peon NP [facility] Cross Cover 7pm-7am - check amion for availability Pager [phone]

## 2024-01-08 DIAGNOSIS — T879 Unspecified complications of amputation stump: Secondary | ICD-10-CM | POA: Diagnosis not present

## 2024-01-08 LAB — CBC WITH DIFFERENTIAL/PLATELET
Abs Immature Granulocytes: 0.01 10*3/uL (ref 0.00–0.07)
Basophils Absolute: 0 10*3/uL (ref 0.0–0.1)
Basophils Relative: 1 %
Eosinophils Absolute: 0 10*3/uL (ref 0.0–0.5)
Eosinophils Relative: 2 %
HCT: 25 % — ABNORMAL LOW (ref 39.0–52.0)
Hemoglobin: 8 g/dL — ABNORMAL LOW (ref 13.0–17.0)
Immature Granulocytes: 1 %
Lymphocytes Relative: 25 %
Lymphs Abs: 0.3 10*3/uL — ABNORMAL LOW (ref 0.7–4.0)
MCH: 26.7 pg (ref 26.0–34.0)
MCHC: 32 g/dL (ref 30.0–36.0)
MCV: 83.3 fL (ref 80.0–100.0)
Monocytes Absolute: 0.3 10*3/uL (ref 0.1–1.0)
Monocytes Relative: 22 %
Neutro Abs: 0.7 10*3/uL — ABNORMAL LOW (ref 1.7–7.7)
Neutrophils Relative %: 49 %
Platelets: 30 10*3/uL — ABNORMAL LOW (ref 150–400)
RBC: 3 MIL/uL — ABNORMAL LOW (ref 4.22–5.81)
RDW: 19.5 % — ABNORMAL HIGH (ref 11.5–15.5)
WBC: 1.4 10*3/uL — CL (ref 4.0–10.5)
nRBC: 1.5 % — ABNORMAL HIGH (ref 0.0–0.2)

## 2024-01-08 LAB — BASIC METABOLIC PANEL WITH GFR
Anion gap: 7 (ref 5–15)
BUN: 5 mg/dL — ABNORMAL LOW (ref 6–20)
CO2: 22 mmol/L (ref 22–32)
Calcium: 7.7 mg/dL — ABNORMAL LOW (ref 8.9–10.3)
Chloride: 110 mmol/L (ref 98–111)
Creatinine, Ser: 0.48 mg/dL — ABNORMAL LOW (ref 0.61–1.24)
GFR, Estimated: >60 mL/min (ref >60)
Glucose, Bld: 130 mg/dL — ABNORMAL HIGH (ref 70–99)
Potassium: 3.6 mmol/L (ref 3.5–5.1)
Sodium: 139 mmol/L (ref 135–145)

## 2024-01-08 LAB — PHOSPHORUS: Phosphorus: 2.3 mg/dL — ABNORMAL LOW (ref 2.5–4.6)

## 2024-01-08 LAB — CBC
HCT: 24.3 % — ABNORMAL LOW (ref 39.0–52.0)
Hemoglobin: 7.9 g/dL — ABNORMAL LOW (ref 13.0–17.0)
MCH: 27.1 pg (ref 26.0–34.0)
MCHC: 32.5 g/dL (ref 30.0–36.0)
MCV: 83.2 fL (ref 80.0–100.0)
Platelets: 29 10*3/uL — CL (ref 150–400)
RBC: 2.92 MIL/uL — ABNORMAL LOW (ref 4.22–5.81)
RDW: 19.6 % — ABNORMAL HIGH (ref 11.5–15.5)
WBC: 1.3 10*3/uL — CL (ref 4.0–10.5)
nRBC: 1.5 % — ABNORMAL HIGH (ref 0.0–0.2)

## 2024-01-08 LAB — PREPARE RBC (CROSSMATCH)

## 2024-01-08 LAB — MAGNESIUM: Magnesium: 1.8 mg/dL (ref 1.7–2.4)

## 2024-01-08 LAB — PATHOLOGIST SMEAR REVIEW

## 2024-01-08 MED ORDER — DOXYCYCLINE HYCLATE 100 MG PO TABS
100.0000 mg | ORAL_TABLET | Freq: Two times a day (BID) | ORAL | Status: DC
  Administered 2024-01-08 – 2024-01-09 (×2): 100 mg via ORAL
  Filled 2024-01-08 (×2): qty 1

## 2024-01-08 MED ORDER — K PHOS MONO-SOD PHOS DI & MONO 155-852-130 MG PO TABS
500.0000 mg | ORAL_TABLET | Freq: Three times a day (TID) | ORAL | Status: AC
  Administered 2024-01-08 (×2): 500 mg via ORAL
  Filled 2024-01-08 (×2): qty 2

## 2024-01-08 MED ORDER — PANTOPRAZOLE SODIUM 40 MG PO TBEC
40.0000 mg | DELAYED_RELEASE_TABLET | Freq: Two times a day (BID) | ORAL | Status: DC
  Administered 2024-01-08 – 2024-01-09 (×2): 40 mg via ORAL
  Filled 2024-01-08 (×2): qty 1

## 2024-01-08 NOTE — Progress Notes (Signed)
 [facility] Progress Note  Patient: Derrick Blanchard    BJY:[id number]  DOA: 01/06/2024     Date of Service: the patient was seen and examined on 01/08/2024  Chief Complaint  Patient presents with   Foot Injury   Brief hospital course: MCCORMICK RACHLIN is a 43 y.o. male with medical history significant of alcoholic cirrhosis, chronic thrombocytopenia, hypertension, who was just recently hospitalized for left foot first ray amputation secondary to nonhealing wound and wound dehiscence, coming back for bleeding and pain from the surgical site.   Patient was recently hospitalized for dehiscence of a left foot surgical wound, underwent left-sided transmetatarsal foot ablation, bone biopsy during surgery showed no osteomyelitis and wound culture was negative.  Patient sent home with p.o. Zyvox .  Patient was sent home on [facility] boot to offload pressure from left foot surgical site however appears that patient has not been using the foot wet right and caused pressure-point to the surgical wound and started to have bleeding since yesterday became worse overnight, able to complaint severe pain 10/10 of the surgical stump site.  Patient also reported that he has been having loose diarrhea 2-3 times a day with abdominal cramping for the last 2-3 days, denies any fever or chills, no tenesmus. ED Course: Afebrile, nontachycardic blood work showed hemoglobin 8.8 WBC 7.6 platelet 54, K3.2 BUN 11 creatinine 0.7.  X-ray of the left foot showed no gas or foreign body.    Assessment and Plan:  Intractable left foot transmetatarsal stump site pain - Clinically suspect some soft tissue injury from inappropriate footwear wearing and pressure injury.  Patient refused to go back to the [place].   D/w podiatry Dr. Feliciana Horn, who will get patient a post-op shoe.  Patient was educated about properly wearing a postop foot to avoid further foot injury.  Presents with altered examining the stump site and found there  is no signs of infection.  Review of wound culture data from last admission and ablation showed no growth.  MRSA screen today negative.  Given that Zyvox  likely causing diarrhea,  switched antibiotic to doxycycline .  Once patient get the proper postoperation boot, and surgical wound pain is controlled, likely can be discharged home tomorrow.  Anemia most likely secondary to acute blood loss secondary to esophageal varices, history of variceal banding which was done at [facility] as per patient Patient noticed little amount of blood during diarrhea before admission. Continue PPI 5/4 Hb 7.0, patient received 1 unit of PRBC transfusion, eight 7.9 today Monitor H&H GI consulted for possible EGD tomorrow a.m.?    Pancytopenia - Secondary to cirrhosis, blood counts stable, no signs of bleeding 5/4 all cell count dropped today, will repeat CBC in the evening and added differential to rule out neutropenia Anemia workup, iron profile, folate and B12 within normal range  Diarrhea, acute - Likely secondary to Zyvox  - Given the nature of diarrhea, low suspicion for C. difficile colitis.  Hold off C. difficile study - Probiotics and Imodium as needed   Hypokalemia, potassium repleted Hypomagnesemia, mag repleted Hypophosphatemia, Phos repleted. Monitor electrolytes and replete as needed  Alcoholic cirrhosis - Stable   Alcohol abuse - No symptoms or signs of active withdrawal - CIWA protocol with as needed benzo     Hypocalcemia, vitamin D level within normal range   Body mass index is 33 kg/m.  Interventions:  Diet: Regular diet DVT Prophylaxis: SCD, pharmacological prophylaxis contraindicated due to traumatic bleeding postop stump.    Advance goals of care  discussion: Full code  Family Communication: family was not present at bedside, at the time of interview.  The pt provided permission to discuss medical plan with the family. Opportunity was given to ask question and all questions were  answered satisfactorily.   Disposition:  Pt is from Home, admitted with Left TMA stump intractable pain and hematoma, diarrhea and electrolyte imbalance still has pain and diarrhea, electrolyte imbalance, which precludes a safe discharge. Discharge to home, when stable, most likely tomorrow a.m.  Subjective: No significant events overnight, patient still complaining of pain in the left foot 6/10, no active bleeding but he did notice some blood during diarrheal episodes before admission. Patient mentioned that he had EGD done at [facility] and variceal banding was done over there.   Physical Exam: General: NAD, lying comfortably Appear in no distress, affect appropriate Eyes: PERRLA ENT: Oral Mucosa Clear, moist  Neck: no JVD,  Cardiovascular: S1 and S2 Present, no Murmur,  Respiratory: good respiratory effort, Bilateral Air entry equal and Decreased, no Crackles, no wheezes Abdomen: Bowel Sound present, Soft and no tenderness,  Skin: no rashes Extremities: s/p Left foot TMA, dressing CDI, mild edema Neurologic: without any new focal findings Gait not checked due to patient safety concerns  Vitals:   01/08/24 0000 01/08/24 0037 01/08/24 0330 01/08/24 0834  BP: (!) 103/59 112/63 107/62 115/70  Pulse: 88 87 73 84  Resp: 18 18 18 17   Temp: 98.8 F (37.1 C) 98.8 F (37.1 C) 98.4 F (36.9 C) 98.2 F (36.8 C)  TempSrc: Oral  Oral   SpO2: 100% 99% 100% 100%  Weight:      Height:        Intake/Output Summary (Last 24 hours) at 01/08/2024 1450 Last data filed at 01/08/2024 1300 Gross per 24 hour  Intake 2968.15 ml  Output 6000 ml  Net -3031.85 ml   Filed Weights   01/06/24 0312 01/06/24 1111  Weight: 99 kg 97 kg    Data Reviewed: I have personally reviewed and interpreted daily labs, tele strips, imagings as discussed above. I reviewed all nursing notes, pharmacy notes, vitals, pertinent old records I have discussed plan of care as described above with RN and  patient/family.  CBC: Recent Labs  Lab 01/04/24 0517 01/06/24 0318 01/07/24 0827 01/07/24 1852 01/08/24 0445  WBC 3.6* 3.6* 1.1*  1.1* 1.3* 1.4*  1.3*  NEUTROABS  --  2.6 0.5* 0.7* 0.7*  HGB 9.1* 8.8* 7.4*  7.4* 7.0* 8.0*  7.9*  HCT 29.3* 27.7* 23.2*  23.2* 22.2* 25.0*  24.3*  MCV 84.0 84.2 83.8  83.8 83.5 83.3  83.2  PLT 61* 54* 32*  31* 32* 30*  29*   Basic Metabolic Panel: Recent Labs  Lab 01/02/24 0341 01/03/24 0346 01/04/24 0517 01/06/24 0318 01/07/24 0827 01/08/24 0445  NA 137 135 137 138 140 139  K 3.9 3.7 4.2 3.2* 3.2* 3.6  CL 108 106 108 109 111 110  CO2 24 20* 21* 20* 22 22  GLUCOSE 103* 101* 128* 104* 106* 130*  BUN 10 10 13 11 8  5*  CREATININE 0.57* 0.67 0.56* 0.76 0.52* 0.48*  CALCIUM  8.3* 7.9* 8.6* 8.1* 7.6* 7.7*  MG 1.7 1.7 2.0  --  1.4* 1.8  PHOS 3.5 3.2 2.3*  --  3.2 2.3*    Studies: No results found.   Scheduled Meds:  acidophilus  1 capsule Oral BID   doxycycline   100 mg Oral Q12H   folic acid   1 mg Oral Daily  multivitamin with minerals  1 tablet Oral Daily   pantoprazole   40 mg Oral Daily   phosphorus  500 mg Oral TID   thiamine   100 mg Oral Daily   Continuous Infusions:   PRN Meds: HYDROmorphone  (DILAUDID ) injection, loperamide, LORazepam  **OR** LORazepam , ondansetron  **OR** ondansetron  (ZOFRAN ) IV, oxyCODONE , polyethylene glycol  Time spent: 55 minutes  Author: Althia Atlas. MD [facility] 01/08/2024 2:50 PM  To reach On-call, see care teams to locate the attending and reach out to them via [email or web address]. If 7PM-7AM, please contact night-coverage If you still have difficulty reaching the attending provider, please page the [facility] (Director on Call) for [facility] on amion for assistance.

## 2024-01-08 NOTE — Consult Note (Addendum)
 Inpatient Consultation   Patient ID: Derrick Blanchard is a 43 y.o. male.  Requesting Provider: Althia Atlas, MD  Date of Admission: 01/06/2024  Date of Consult: 01/08/24   Reason for Consultation: anemia   Patient's Chief Complaint:   Chief Complaint  Patient presents with   Foot Injury    43 year old Caucasian male with history of alcoholic related cirrhosis with sequelae of portal hypertension including grade 1 esophageal varices (never been banded), portal hypertensive gastropathy, thrombocytopenia, hypertension who presents to the hospital after recent partial foot amputation with nonhealing and bleeding wound.  Gastroenterology was consulted for anemia  While on antibiotics, the patient has developed loose bowel movements.  A few days ago he noted some bright red blood per rectum, but no further bleeding prior to her since then.  He denies any melena.  He does not feel that there is any abdominal distention.  No hematemesis or coffee-ground emesis.  Hemoglobin has trended down postop reaching a nadir of 7 for which she received 1 unit of PRBCs with appropriate improvement to 8.  Platelets are around 30,000.  He had a global drop in his cell lines with this as well.  Mild right upper quadrant discomfort at baseline.  He has some bruising at hospital medication related injection sites as well.  During my evaluation the patient was able to have a bowel movement which was well-formed and brown with no signs of GI bleeding.  His BUN/creatinine ratio remains low.  Vital signs remain stable with no signs of hypotension or tachycardia beyond his baseline.  Tolerating p.o. without issue.  Denies dysphagia and odynophagia.  Actively drinking 12-15 twelve ounce beers per day. Rare shot of liquor. + THC, rare tobacco use  Last saw [facility] hepatology in 2022 when he was still living in [place]. Has had no GI follow up since then.  Denies NSAIDs, Anti-plt agents, and anticoagulants Denies  family history of gastrointestinal disease and malignancy Previous Endoscopies: Multiple egds- most recent in 2022- h/o grade d esophagitis, grade 1 esophageal varices, PHG. Has had duodenal ulcer as well in the past  Multiple egds in 2022. One in 2019 and 2016 per chart review. Per chart review- patient has never undergone EVL/banding.   Flex sig 2020- rectal fissure    Past Medical History:  Diagnosis Date   Anemia    Anxiety    Cirrhosis (HCC)    Depression    Heart murmur    Hepatitis    Hypertension    Substance abuse [facility])     Past Surgical History:  Procedure Laterality Date   AMPUTATION Left 12/29/2023   Procedure: AMPUTATION, FOOT, RAY;  Surgeon: Evertt Hoe, DPM;  Location: [facility] ORS;  Service: Orthopedics/Podiatry;  Laterality: Left;  Partial first ray amp, abx beads, graft application   COLONOSCOPY     ESOPHAGOGASTRODUODENOSCOPY (EGD) WITH PROPOFOL  N/A 02/03/2015   Procedure: ESOPHAGOGASTRODUODENOSCOPY (EGD) WITH PROPOFOL ;  Surgeon: Luella Sager, MD;  Location: [facility] ENDOSCOPY;  Service: Endoscopy;  Laterality: N/A;   ESOPHAGOGASTRODUODENOSCOPY (EGD) WITH PROPOFOL  N/A 12/21/2017   Procedure: ESOPHAGOGASTRODUODENOSCOPY (EGD) WITH PROPOFOL ;  Surgeon: Luke Salaam, MD;  Location: [facility] ENDOSCOPY;  Service: Gastroenterology;  Laterality: N/A;   FISSURECTOMY     IRRIGATION AND DEBRIDEMENT FOOT Left 12/16/2023   Procedure: IRRIGATION AND DEBRIDEMENT FOOT, possible sesamoidectomy, bone biopsy;  Surgeon: Reina Cara, DPM;  Location: [facility] ORS;  Service: Podiatry;  Laterality: Left;  Pulse lavage, bone biopsy with jam shidi needle, mini c arm, sagittal  saw available   TRANSMETATARSAL AMPUTATION Left 01/03/2024   Procedure: AMPUTATION, FOOT, TRANSMETATARSAL;  Surgeon: Evertt Hoe, DPM;  Location: [facility] ORS;  Service: Orthopedics/Podiatry;  Laterality: Left;  Left foot Trans met amputation    No Known Allergies  Family History  Problem Relation Age of  Onset   Lung cancer Mother    Cirrhosis Father    Lung cancer Paternal Grandfather     Social History   Tobacco Use   Smoking status: Some Days    Current packs/day: 0.25    Types: Cigarettes   Smokeless tobacco: Never  Vaping Use   Vaping status: Never Used  Substance Use Topics   Alcohol use: Not Currently    Comment: since 10/03/17   Drug use: Yes    Types: Marijuana    Comment: 12/21/17     Pertinent GI related history and allergies were reviewed with the patient  Review of Systems  Constitutional:  Positive for appetite change (with recent operation). Negative for activity change, chills, diaphoresis, fatigue, fever and unexpected weight change.  HENT:  Negative for trouble swallowing and voice change.   Respiratory:  Negative for shortness of breath and wheezing.   Cardiovascular:  Negative for chest pain, palpitations and leg swelling.  Gastrointestinal:  Positive for diarrhea. Negative for abdominal distention, abdominal pain, anal bleeding, blood in stool, constipation, nausea and vomiting.  Musculoskeletal:  Positive for arthralgias (foot pain at amputation). Negative for myalgias.  Skin:  Negative for color change and pallor.  Neurological:  Negative for dizziness, syncope and weakness.  Psychiatric/Behavioral:  Negative for confusion. The patient is not nervous/anxious.   All other systems reviewed and are negative.    Medications Home Medications No current facility-administered medications on file prior to encounter.   Current Outpatient Medications on File Prior to Encounter  Medication Sig Dispense Refill   folic acid  (FOLVITE ) 1 MG tablet Take 1 tablet (1 mg total) by mouth daily. 30 tablet 0   linezolid  (ZYVOX ) 600 MG tablet Take 1 tablet (600 mg total) by mouth every 12 (twelve) hours for 5 days. 10 tablet 0   Multiple Vitamin (MULTIVITAMIN WITH MINERALS) TABS tablet Take 1 tablet by mouth daily.     oxyCODONE  (OXY IR/ROXICODONE ) 5 MG immediate release  tablet Take 1 tablet (5 mg total) by mouth every 6 (six) hours as needed for up to 5 days for severe pain (pain score 7-10) (for pain not relieved by tylenol  alone.). 15 tablet 0   pantoprazole  (PROTONIX ) 40 MG tablet Take 1 tablet (40 mg total) by mouth daily. 30 tablet 0   polyethylene glycol powder (GLYCOLAX /MIRALAX ) 17 GM/SCOOP powder Take 17 g by mouth daily. 238 g 0   thiamine  (VITAMIN B-1) 100 MG tablet Take 1 tablet (100 mg total) by mouth daily. 30 tablet 0   Pertinent GI related medications were reviewed with the patient  Inpatient Medications  Current Facility-Administered Medications:    acidophilus (RISAQUAD) capsule 1 capsule, 1 capsule, Oral, BID, Antoniette Batty T, MD, 1 capsule at 01/08/24 0800   doxycycline  (VIBRA -TABS) tablet 100 mg, 100 mg, Oral, Q12H, Althia Atlas, MD   folic acid  (FOLVITE ) tablet 1 mg, 1 mg, Oral, Daily, Zhang, Ping T, MD, 1 mg at 01/08/24 0800   HYDROmorphone  (DILAUDID ) injection 0.5 mg, 0.5 mg, Intravenous, Q4H PRN, Antoniette Batty T, MD, 0.5 mg at 01/08/24 0754   loperamide (IMODIUM) capsule 2 mg, 2 mg, Oral, PRN, Zhang, Ping T, MD   LORazepam  (ATIVAN ) tablet 1-4 mg, 1-4  mg, Oral, Q1H PRN, 1 mg at 01/08/24 0039 **OR** LORazepam  (ATIVAN ) injection 1-4 mg, 1-4 mg, Intravenous, Q1H PRN, [place], MD   multivitamin with minerals tablet 1 tablet, 1 tablet, Oral, Daily, Antoniette Batty T, MD, 1 tablet at 01/08/24 0800   ondansetron  (ZOFRAN ) tablet 4 mg, 4 mg, Oral, Q6H PRN **OR** ondansetron  (ZOFRAN ) injection 4 mg, 4 mg, Intravenous, Q6H PRN, [place], MD   oxyCODONE  (Oxy IR/ROXICODONE ) immediate release tablet 5 mg, 5 mg, Oral, Q6H PRN, Antoniette Batty T, MD, 5 mg at 01/08/24 0038   pantoprazole  (PROTONIX ) EC tablet 40 mg, 40 mg, Oral, BID, Althia Atlas, MD   phosphorus (K PHOS  NEUTRAL) tablet 500 mg, 500 mg, Oral, TID, Althia Atlas, MD, 500 mg at 01/08/24 1045   polyethylene glycol (MIRALAX  / GLYCOLAX ) packet 17 g, 17 g, Oral, Daily PRN, Ramonita Burow,  RPH   thiamine  (VITAMIN B1) tablet 100 mg, 100 mg, Oral, Daily, Sung, Jade J, MD, 100 mg at 01/08/24 0800   HYDROmorphone  (DILAUDID ) injection, loperamide, LORazepam  **OR** LORazepam , ondansetron  **OR** ondansetron  (ZOFRAN ) IV, oxyCODONE , polyethylene glycol   Objective   Vitals:   01/08/24 0000 01/08/24 0037 01/08/24 0330 01/08/24 0834  BP: (!) 103/59 112/63 107/62 115/70  Pulse: 88 87 73 84  Resp: 18 18 18 17   Temp: 98.8 F (37.1 C) 98.8 F (37.1 C) 98.4 F (36.9 C) 98.2 F (36.8 C)  TempSrc: Oral  Oral   SpO2: 100% 99% 100% 100%  Weight:      Height:        Physical Exam Vitals and nursing note reviewed.  Constitutional:      General: He is not in acute distress.    Appearance: He is ill-appearing. He is not toxic-appearing or diaphoretic.  HENT:     Head: Normocephalic and atraumatic.     Nose: Nose normal.     Mouth/Throat:     Mouth: Mucous membranes are moist.     Pharynx: Oropharynx is clear.  Eyes:     General: No scleral icterus.    Extraocular Movements: Extraocular movements intact.  Cardiovascular:     Rate and Rhythm: Normal rate and regular rhythm.  Pulmonary:     Effort: Pulmonary effort is normal. No respiratory distress.     Breath sounds: Normal breath sounds. No wheezing, rhonchi or rales.  Abdominal:     General: Bowel sounds are normal.     Palpations: Abdomen is soft.     Tenderness: There is no abdominal tenderness. There is no guarding or rebound.     Comments: Protuberant. No rigidity, non peritoneal  Musculoskeletal:     Cervical back: Neck supple.     Right lower leg: No edema.     Left lower leg: No edema.     Comments: Both feet with partial amputation. Currently wrapped  Skin:    General: Skin is warm and dry.     Coloration: Skin is not jaundiced or pale.  Neurological:     General: No focal deficit present.     Mental Status: He is alert and oriented to person, place, and time. Mental status is at baseline.  Psychiatric:         Mood and Affect: Mood normal.        Behavior: Behavior normal.        Thought Content: Thought content normal.        Judgment: Judgment normal.     Laboratory Data Recent Labs  Lab 01/07/24  0827 01/07/24 1852 01/08/24 0445  WBC 1.1*  1.1* 1.3* 1.4*  1.3*  HGB 7.4*  7.4* 7.0* 8.0*  7.9*  HCT 23.2*  23.2* 22.2* 25.0*  24.3*  PLT 32*  31* 32* 30*  29*   Recent Labs  Lab 01/06/24 0318 01/07/24 0827 01/08/24 0445  NA 138 140 139  K 3.2* 3.2* 3.6  CL 109 111 110  CO2 20* 22 22  BUN 11 8 5*  CALCIUM  8.1* 7.6* 7.7*  GLUCOSE 104* 106* 130*   No results for input(s): "INR" in the last 168 hours.  No results for input(s): "LIPASE" in the last 72 hours.      Imaging Studies: CT HEAD WO CONTRAST ( ) Result Date: 01/07/2024 CLINICAL DATA:  43 year old male status post recent lower extremity surgery. Wound dehiscence, pain. Coagulopathy, head trauma. EXAM: CT HEAD WITHOUT CONTRAST TECHNIQUE: Contiguous axial images were obtained from the base of the skull through the vertex without intravenous contrast. RADIATION DOSE REDUCTION: This exam was performed according to the departmental dose-optimization program which includes automated exposure control, adjustment of the mA and/or kV according to patient size and/or use of iterative reconstruction technique. COMPARISON:  Head CT 03/10/2023. FINDINGS: Brain: No midline shift, ventriculomegaly, mass effect, evidence of mass lesion, intracranial hemorrhage or evidence of cortically based acute infarction. Chronic cerebral volume loss for age appears generalized, not significantly changed from last year. Gray-white differentiation is stable and within limits. Vascular: No suspicious intracranial vascular hyperdensity. Skull: Stable, intact.  No acute osseous abnormality identified. Sinuses/Orbits: Visualized paranasal sinuses and mastoids are stable and well aerated. Other: Leftward gaze. No acute orbit or scalp soft tissue injury  identified. IMPRESSION: 1. No acute intracranial abnormality or acute traumatic injury identified. 2. Cerebral volume loss seems chronically advanced for age. Electronically Signed   By: Marlise Simpers M.D.   On: 01/07/2024 03:54    Assessment:   # Pancytopenia - plt 30K  # acute on chronic anemia - now s/p 1 u prbc 7 --> 8 - no signs of gib at this time  # etoh cirrhosis with portal htn - g1 varices- never been banded in chart review - phg - no recent INR For MELD scoring  # etoh dependence - h/o withdrawal sx  # gerd with esophagitis  # chronic HCV- unknown treatment status  # acute diarrhea  # complication of foot amputation - p/w bleeding form recent stump  Plan:   No current signs of overt GIB Global cell line drop along with hgb. P/w bleeding from recent surgical site. Responded appropriately to 1 u prbc No elevation in bun/cr ratio. Vital signs have remained stable and consistent Well formed brown BM during evaluation Correct thrombocytopenia and coagulopathy if bleeding persists Continue ppi  Monitor H&H.  Transfusion and resuscitation as per primary team Avoid frequent lab draws to prevent lab induced anemia Supportive care and antiemetics as per primary team Maintain two sites IV access Avoid nsaids Monitor for GIB.  Diarrhea has seemingly resolved as he reported no BM yesterday or day prior. Suspect relating to abx  No indication for endoscopy at this time CIWA protocol. Counseled on alcohol cessatoin  Follow up with outpatient GI for continued liver care  Discussed case with nursing and primary team for coordination of care Management of other medical comorbidities as per primary team  GI to sign off. Available as needed. Please do not hesitate to call regarding questions or concerns.  I personally performed the service.  Thank you for  allowing us  to participate in this patient's care. Please don't hesitate to call if any questions or concerns arise.    Derrick Buckle, DO [facility]  Portions of the record may have been created with voice recognition software. Occasional wrong-word or 'sound-a-like' substitutions may have occurred due to the inherent limitations of voice recognition software.  Read the chart carefully and recognize, using context, where substitutions may have occurred.

## 2024-01-09 ENCOUNTER — Other Ambulatory Visit: Payer: Self-pay

## 2024-01-09 DIAGNOSIS — T879 Unspecified complications of amputation stump: Secondary | ICD-10-CM | POA: Diagnosis not present

## 2024-01-09 LAB — CBC WITH DIFFERENTIAL/PLATELET
Abs Immature Granulocytes: 0.11 10*3/uL — ABNORMAL HIGH (ref 0.00–0.07)
Basophils Absolute: 0 10*3/uL (ref 0.0–0.1)
Basophils Relative: 1 %
Eosinophils Absolute: 0.1 10*3/uL (ref 0.0–0.5)
Eosinophils Relative: 2 %
HCT: 24.9 % — ABNORMAL LOW (ref 39.0–52.0)
Hemoglobin: 8 g/dL — ABNORMAL LOW (ref 13.0–17.0)
Immature Granulocytes: 3 %
Lymphocytes Relative: 14 %
Lymphs Abs: 0.5 10*3/uL — ABNORMAL LOW (ref 0.7–4.0)
MCH: 26.4 pg (ref 26.0–34.0)
MCHC: 32.1 g/dL (ref 30.0–36.0)
MCV: 82.2 fL (ref 80.0–100.0)
Monocytes Absolute: 0.5 10*3/uL (ref 0.1–1.0)
Monocytes Relative: 15 %
Neutro Abs: 2.2 10*3/uL (ref 1.7–7.7)
Neutrophils Relative %: 65 %
Platelets: 35 10*3/uL — ABNORMAL LOW (ref 150–400)
RBC: 3.03 MIL/uL — ABNORMAL LOW (ref 4.22–5.81)
RDW: 19.6 % — ABNORMAL HIGH (ref 11.5–15.5)
WBC: 3.4 10*3/uL — ABNORMAL LOW (ref 4.0–10.5)
nRBC: 0.9 % — ABNORMAL HIGH (ref 0.0–0.2)

## 2024-01-09 LAB — BASIC METABOLIC PANEL WITH GFR
Anion gap: 4 — ABNORMAL LOW (ref 5–15)
BUN: 5 mg/dL — ABNORMAL LOW (ref 6–20)
CO2: 24 mmol/L (ref 22–32)
Calcium: 8.2 mg/dL — ABNORMAL LOW (ref 8.9–10.3)
Chloride: 109 mmol/L (ref 98–111)
Creatinine, Ser: 0.64 mg/dL (ref 0.61–1.24)
GFR, Estimated: >60 mL/min (ref >60)
Glucose, Bld: 130 mg/dL — ABNORMAL HIGH (ref 70–99)
Potassium: 3.6 mmol/L (ref 3.5–5.1)
Sodium: 137 mmol/L (ref 135–145)

## 2024-01-09 LAB — PHOSPHORUS: Phosphorus: 2.5 mg/dL (ref 2.5–4.6)

## 2024-01-09 LAB — MAGNESIUM: Magnesium: 1.5 mg/dL — ABNORMAL LOW (ref 1.7–2.4)

## 2024-01-09 MED ORDER — DOXYCYCLINE HYCLATE 100 MG PO TABS
100.0000 mg | ORAL_TABLET | Freq: Two times a day (BID) | ORAL | 0 refills | Status: AC
Start: 2024-01-09 — End: 2024-01-14
  Filled 2024-01-09: qty 10, 5d supply, fill #0

## 2024-01-09 MED ORDER — OXYCODONE HCL 5 MG PO TABS
5.0000 mg | ORAL_TABLET | Freq: Three times a day (TID) | ORAL | 0 refills | Status: AC | PRN
  Filled 2024-01-09: qty 15, 5d supply, fill #0

## 2024-01-09 MED ORDER — MAGNESIUM SULFATE 2 GM/50ML IV SOLN
2.0000 g | Freq: Once | INTRAVENOUS | Status: AC
  Administered 2024-01-09: 2 g via INTRAVENOUS
  Filled 2024-01-09: qty 50

## 2024-01-09 NOTE — Evaluation (Signed)
 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 Pt returns to ED after extensive bleeding at surgical dressingt, pt not adherent to NWB as instructed.  Clinical Impression  Pt alert, calm, coherent this date, cognitively intact compared to prior admission. Pt aware of weightbearing status when asked, able to manage his own Left postop shoe with effort and time. Pt demonstrates transfers and AMB with RW, NWB 95% of the time, unable to remain NWB while performing standing post BM pericare/LBD. Pt denies any DME needs at this time, has RW at home. No additional PT services needed at this time. Recommend HHPT at DC to mediate LLE disuse atrophy and reinforce educational interventions. PT signing off.       If plan is discharge home, recommend the following: Assist for transportation;Help with stairs or ramp for entrance;Direct supervision/assist for medications management;Direct supervision/assist for financial management;Assistance with cooking/housework;Supervision due to cognitive status   Can travel by private vehicle   Yes    Equipment Recommendations None recommended by PT  Recommendations for Other Services       Functional Status Assessment Patient has had a recent decline in their functional status and demonstrates the ability to make significant improvements in function in a reasonable and predictable amount of time.     Precautions / Restrictions        Mobility  Bed Mobility Overal bed mobility: Independent                  Transfers Overall transfer level: Needs assistance Equipment used: Rolling walker (2 wheels) Transfers: Sit to/from Stand Sit to Stand: Modified  independent (Device/Increase time)           General transfer comment: Inconsistent adherence to NWB LLE, mostly PWB while standing, aware of status    Ambulation/Gait   Gait Distance (Feet): 100 Feet (pt cued to try 15ft but he is motivated and asks to go farther) Assistive device: Rolling walker (2 wheels)         General Gait Details: demonstrates capacity for hopping with RW  pt verbalizes his NWB precautions, more cognitively appropraite this date compared to prior admission.  Stairs            Wheelchair Mobility     Tilt Bed    Modified Rankin (Stroke Patients Only)       Balance Overall balance assessment: Modified Independent, History of Falls                                           Pertinent Vitals/Pain Pain Assessment Pain Assessment: 0-10 Pain Score: 7     Home Living Family/patient expects to be discharged to:: Private residence (friends apartment) Living Arrangements: Other (Comment) Available Help at Discharge: Friend(s) Type of Home: Apartment Home Access: Stairs to enter Entrance Stairs-Rails: None Entrance Stairs-Number of Steps: 1   Home Layout: One level Home Equipment: Agricultural consultant (2 wheels)      Prior Function Prior Level of Function : Independent/Modified Independent             Mobility Comments: awareness of NWB, better adhereance, stil mixed adherance durign standing rest and transfers ADLs Comments:  independent     Extremity/Trunk Assessment                Communication        Cognition                                         Cueing       General Comments      Exercises     Assessment/Plan    PT Assessment Patient does not need any further PT services  PT Problem List Decreased balance;Decreased mobility;Decreased coordination;Decreased cognition;Decreased safety awareness;Decreased knowledge of precautions       PT Treatment Interventions  Neuromuscular re-education;Gait training;Stair training;Functional mobility training;Therapeutic activities;Therapeutic exercise;Balance training;Patient/family education;DME instruction    PT Goals (Current goals can be found in the Care Plan section)  Acute Rehab PT Goals PT Goal Formulation: All assessment and education complete, DC therapy    Frequency       Co-evaluation               AM-PAC PT "6 Clicks" Mobility  Outcome Measure Help needed turning from your back to your side while in a flat bed without using bedrails?: None Help needed moving from lying on your back to sitting on the side of a flat bed without using bedrails?: None Help needed moving to and from a bed to a chair (including a wheelchair)?: A Little Help needed standing up from a chair using your arms (e.g., wheelchair or bedside chair)?: A Little Help needed to walk in hospital room?: A Little Help needed climbing 3-5 steps with a railing? : A Little 6 Click Score: 20    End of Session   Activity Tolerance: Patient tolerated treatment well;No increased pain;Patient limited by fatigue Patient left: in bed Nurse Communication: Mobility status PT Visit Diagnosis: Other abnormalities of gait and mobility (R26.89);Difficulty in walking, not elsewhere classified (R26.2);Muscle weakness (generalized) (M62.81) Pain - Right/Left: Left Pain - part of body: Ankle and joints of foot    Time: 9604-5409 PT Time Calculation (min) (ACUTE ONLY): 22 min   Charges:   PT Evaluation $PT Eval Moderate Complexity: 1 Mod PT Treatments $Gait Training: 8-22 mins PT General Charges $$ ACUTE PT VISIT: 1 Visit    11:33 AM, 01/09/24 Dawn Eth, PT, DPT Physical Therapist - [facility]  [phone] (ASCOM)    Derrick Blanchard C 01/09/2024, 11:31 AM

## 2024-01-09 NOTE — Discharge Summary (Signed)
 [facility] Discharge Summary   Patient: Derrick Blanchard ZOX:[id number]  PCP: Pcp, No  Date of admission: 01/06/2024   Date of discharge:  01/09/2024     Discharge Diagnoses:  Principal Problem:   Complication of foot amputation stump (HCC)   Admitted From: Home Disposition:  Home   Recommendations for Outpatient Follow-up:  Follow-up with PCP, repeat CBC, BMP and Mag in 1 week Follow-up with podiatry for postop check, Follow-up with GI for liver cirrhosis management as an outpatient Follow up LABS/TEST:  repeat CBC, BMP and Mag in 1 week   Follow-up Information     PCP Follow up in 1 week(s).          Quintin Buckle, DO Follow up in 1 week(s).   Specialty: Gastroenterology Why: Per office - will call Patient directly w/ Appt. Contact information: [address] [facility] [place] [address] [phone]         Evertt Hoe, DPM. Go on 01/11/2024.   Specialty: Podiatry Why: Appt @ 2:00 pm Contact information: [address] [place] [address] [phone]                Diet recommendation: Cardiac diet  Activity: The patient is advised to gradually reintroduce usual activities, as tolerated  Discharge Condition: stable  Code Status: Full code   History of present illness: As per the H and P dictated on admission Hospital Course:  Derrick Blanchard is a 43 y.o. male with medical history significant of alcoholic cirrhosis, chronic thrombocytopenia, hypertension, who was just recently hospitalized for left foot first ray amputation secondary to nonhealing wound and wound dehiscence, coming back for bleeding and pain from the surgical site.   Patient was recently hospitalized for dehiscence of a left foot surgical wound, underwent left-sided transmetatarsal foot ablation, bone biopsy during surgery showed no osteomyelitis and wound culture was negative.  Patient sent home with p.o. Zyvox .   Patient was sent home on [facility] boot to offload pressure from left foot surgical site however appears that patient has not been using the foot wet right and caused pressure-point to the surgical wound and started to have bleeding since yesterday became worse overnight, able to complaint severe pain 10/10 of the surgical stump site.  Patient also reported that he has been having loose diarrhea 2-3 times a day with abdominal cramping for the last 2-3 days, denies any fever or chills, no tenesmus. ED Course: Afebrile, nontachycardic blood work showed hemoglobin 8.8 WBC 7.6 platelet 54, K3.2 BUN 11 creatinine 0.7.  X-ray of the left foot showed no gas or foreign body.     Assessment and Plan:   # Intractable left foot transmetatarsal stump site pain - Clinically suspect some soft tissue injury from inappropriate footwear wearing and pressure injury.  Patient refused to go back to the [place].   D/w podiatry Dr. Feliciana Horn, pt got post-op shoe.  Patient was educated about properly wearing a postop foot to avoid further foot injury.  Presents with altered examining the stump site and found there is no signs of infection.  Review of wound culture data from last admission and ablation showed no growth.  MRSA screen negative.  Given that Zyvox  likely causing diarrhea,  switched antibiotic to doxycycline .  Pain is well discharged on po oxycodone . Pt agreed with dc plan today.    # Anemia, possible GI bleeding due to portal hypertensive gastropathy.  Less likely esophageal variceal bleeding. Continue pantoprazole . On 5/4 Hb  7.0, patient received 1 unit of PRBC transfusion..  Hb 8.0 today.  No active bleeding.  Discussed with GI, recommended no intervention and follow-up as an outpatient   Pancytopenia - Secondary to cirrhosis, blood counts stable, no signs of bleeding 5/4 all cell count dropped today, repeated CBC in the evening showed neutropenia, but pt was afebrile.  Anemia workup, iron profile, folate and B12  within normal range 5/6 CBC remained stable    # Diarrhea, acute, Likely secondary to Zyvox . Resolved  Given the nature of diarrhea, low suspicion for C. difficile colitis.  Held off C. difficile study. S/p Probiotics and Imodium as needed.  Diarrhea almost resolved. Hypokalemia, potassium repleted.  Resolved Hypomagnesemia, mag repleted before discharge. Hypophosphatemia, Phos repleted.  Resolved  Alcoholic cirrhosis: Stable Alcohol abuse: No symptoms or signs of active withdrawal S/p CIWA protocol with as needed benzo. No withdrawal symptoms during the stay   Hypocalcemia, vitamin D level within normal range   Body mass index is 33 kg/m.  Nutrition Interventions:  Pain control  - [place]  Controlled Substance Reporting System database could not be reviewed as website was not working. - Oxycodone  15 tabs prescribed. - Patient was instructed, not to drive, operate heavy machinery, perform activities at heights, swimming or participation in water activities or provide baby sitting services while on Pain, Sleep and Anxiety Medications; until his outpatient Physician has advised to do so again.  - Also recommended to not to take more than prescribed Pain, Sleep and Anxiety Medications.  Patient was ambulatory without any assistance. Seen by PT/OT, s/p post op shoes On the day of the discharge the patient's vitals were stable, and no other acute medical condition were reported by patient. the patient was felt safe to be discharge at Home.  Consultants: Podiatry and GI Procedures: none  Discharge Exam: General: Appear in no distress, no Rash; Oral Mucosa Clear, moist. Cardiovascular: S1 and S2 Present, no Murmur, Respiratory: normal respiratory effort, Bilateral Air entry present and no Crackles, no wheezes Abdomen: Bowel Sound present, Soft and no tenderness, no hernia Extremities: Left foot s/p TMA, dressing CDI, no Pedal edema, no calf tenderness Neurology: alert and  oriented to time, place, and person affect appropriate.  Filed Weights   01/06/24 0312 01/06/24 1111  Weight: 99 kg 97 kg   Vitals:   01/09/24 0402 01/09/24 0822  BP: 114/71 119/69  Pulse: 86 81  Resp: 16 16  Temp: 98 F (36.7 C) 98.2 F (36.8 C)  SpO2: 98% 98%    DISCHARGE MEDICATION: Allergies as of 01/09/2024   No Known Allergies      Medication List     STOP taking these medications    linezolid  600 MG tablet Commonly known as: ZYVOX        TAKE these medications    doxycycline  100 MG tablet Commonly known as: VIBRA -TABS Take 1 tablet (100 mg total) by mouth every 12 (twelve) hours for 5 days.   folic acid  1 MG tablet Commonly known as: FOLVITE  Take 1 tablet (1 mg total) by mouth daily.   multivitamin with minerals Tabs tablet Take 1 tablet by mouth daily.   oxyCODONE  5 MG immediate release tablet Commonly known as: Oxy IR/ROXICODONE  Take 1 tablet (5 mg total) by mouth 3 (three) times daily as needed for up to 5 days for severe pain (pain score 7-10) (for pain not relieved by tylenol  alone.). What changed: when to take this   pantoprazole  40 MG tablet Commonly known as: PROTONIX  Take  1 tablet (40 mg total) by mouth daily.   polyethylene glycol powder 17 GM/SCOOP powder Commonly known as: GLYCOLAX /MIRALAX  Take 17 g by mouth daily.   thiamine  100 MG tablet Commonly known as: VITAMIN B1 Take 1 tablet (100 mg total) by mouth daily.               Discharge Care Instructions  (From admission, onward)           Start     Ordered   01/09/24 0000  Discharge wound care:       Comments: As above   01/09/24 1047           No Known Allergies Discharge Instructions     Call MD for:  difficulty breathing, headache or visual disturbances   Complete by: As directed    Call MD for:  extreme fatigue   Complete by: As directed    Call MD for:  persistant dizziness or light-headedness   Complete by: As directed    Call MD for:  persistant  nausea and vomiting   Complete by: As directed    Call MD for:  severe uncontrolled pain   Complete by: As directed    Call MD for:  temperature >100.4   Complete by: As directed    Diet - low sodium heart healthy   Complete by: As directed    Discharge instructions   Complete by: As directed    Follow-up with PCP, repeat CBC, BMP and Mag in 1 week Follow-up with podiatry for postop check, Follow-up with GI for liver cirrhosis management as an outpatient   Discharge wound care:   Complete by: As directed    As above   Increase activity slowly   Complete by: As directed        The results of significant diagnostics from this hospitalization (including imaging, microbiology, ancillary and laboratory) are listed below for reference.    Significant Diagnostic Studies: CT HEAD WO CONTRAST ( ) Result Date: 01/07/2024 CLINICAL DATA:  43 year old male status post recent lower extremity surgery. Wound dehiscence, pain. Coagulopathy, head trauma. EXAM: CT HEAD WITHOUT CONTRAST TECHNIQUE: Contiguous axial images were obtained from the base of the skull through the vertex without intravenous contrast. RADIATION DOSE REDUCTION: This exam was performed according to the departmental dose-optimization program which includes automated exposure control, adjustment of the mA and/or kV according to patient size and/or use of iterative reconstruction technique. COMPARISON:  Head CT 03/10/2023. FINDINGS: Brain: No midline shift, ventriculomegaly, mass effect, evidence of mass lesion, intracranial hemorrhage or evidence of cortically based acute infarction. Chronic cerebral volume loss for age appears generalized, not significantly changed from last year. Gray-white differentiation is stable and within limits. Vascular: No suspicious intracranial vascular hyperdensity. Skull: Stable, intact.  No acute osseous abnormality identified. Sinuses/Orbits: Visualized paranasal sinuses and mastoids are stable and well  aerated. Other: Leftward gaze. No acute orbit or scalp soft tissue injury identified. IMPRESSION: 1. No acute intracranial abnormality or acute traumatic injury identified. 2. Cerebral volume loss seems chronically advanced for age. Electronically Signed   By: Marlise Simpers M.D.   On: 01/07/2024 03:54   DG Foot Complete Left Result Date: 01/06/2024 CLINICAL DATA:  Status post amputation with bleeding wound, initial encounter EXAM: LEFT FOOT - COMPLETE 3+ VIEW COMPARISON:  01/03/2024 FINDINGS: Changes consistent with transmetatarsal amputation are seen. Surgical staples are noted in place. No erosive changes are noted. Previously seen subcutaneous air has resolved. IMPRESSION: Changes consistent with transmetatarsal amputation. No acute  bony abnormality is noted. Electronically Signed   By: Violeta Grey M.D.   On: 01/06/2024 03:54   DG Foot 2 Views Left Result Date: 01/03/2024 CLINICAL DATA:  Status post amputation EXAM: LEFT FOOT - 2 VIEW COMPARISON:  12/29/23 FINDINGS: There are changes consistent with transmetatarsal amputation new from the prior exam. Air is noted in the soft tissue wound consistent with the postoperative state. No definitive erosive changes are noted. IMPRESSION: Interval transmetatarsal amputation. Electronically Signed   By: Violeta Grey M.D.   On: 01/03/2024 10:22   DG Foot 2 Views Left Result Date: 12/29/2023 CLINICAL DATA:  Postoperative. EXAM: LEFT FOOT - 2 VIEW COMPARISON:  Left foot x-ray 12/27/2023 FINDINGS: There is been amputation of the mid and proximal second and first metatarsals as well as first phalanx. Antibiotic seeds are seen overlying the surgical site. There is soft tissue swelling, soft tissue air and skin staples compatible with recent surgery. There is no acute fracture or dislocation. There is dorsal foot soft tissue swelling. IMPRESSION: Status post amputation of the mid and proximal second and first metatarsals as well as first phalanx. Electronically Signed   By: Tyron Gallon M.D.   On: 12/29/2023 17:35   MR FOOT LEFT W WO CONTRAST Result Date: 12/27/2023 CLINICAL DATA:  Status post incision and drainage on 12/16/2023 with increased pain and swelling. EXAM: MRI OF THE LEFT FOREFOOT WITHOUT AND WITH CONTRAST TECHNIQUE: Multiplanar, multisequence MR imaging of the left forefoot was performed both before and after administration of intravenous contrast. CONTRAST:  10mL GADAVIST  GADOBUTROL  1 MMOL/ML IV SOLN COMPARISON:  Left foot radiographs dated 12/27/2023. MRI of the left foot dated 12/13/2023. FINDINGS: Bones/Joint/Cartilage Status post interval incision and drainage with new soft tissue defect at the medial plantar forefoot, overlying the proximal to mid great toe. This soft tissue defect/wound measures up to 4.2 cm AP x 4.1 cm TR x 1.8 cm CC and extends deep to the level of the plantar margin of the flexor hallucis longus tendon at the level of the first MTP joint as well as the plantar surface of the lateral hallux sesamoid which demonstrates marrow signal abnormality with corresponding enhancement, increased since the prior exam, concerning for osteomyelitis. There is marrow signal abnormality with associated enhancement of the first proximal phalanx and the distal 2/3 of the first metatarsal. Focal region of T2 hyperintense marrow signal abnormality within the lateral aspect of the first metatarsal head with peripheral enhancement (series 12, image 15 and series 5, image 23). These findings could relate to interval postsurgical change, however, osteomyelitis can not be excluded. The soft tissue defect/wound appears to extend deep to the medial plantar margin of the second metatarsal head which demonstrates marrow signal abnormality with mild enhancement, which could reflect interval postsurgical change, however, osteomyelitis can not be excluded. Prior amputation of the second digit the level of second MTP joint. Mild degenerative changes of the first MTP joint. No  significant joint effusion. Ligaments Lisfranc ligament is intact. Muscles and Tendons Flexor and extensor compartments are intact. Trace tenosynovitis of the flexor hallucis longus tendon. Diffuse atrophy of the intrinsic musculature of the foot, likely secondary to chronic denervation changes. Soft tissue Status post interval incision and drainage with large soft tissue defect at the medial plantar forefoot, as described above, with surrounding edema and enhancement. No loculated fluid collection. IMPRESSION: 1. Status post interval incision and drainage with new large soft tissue defect at the medial plantar forefoot, overlying the proximal to mid great toe,  measuring up to 4.2 x 4.1 x 1.8 cm. This collection extends deep to the plantar surface of the lateral hallux sesamoid, which demonstrates marrow signal abnormality with corresponding enhancement, increased since the prior exam, concerning for osteomyelitis. 2. Marrow signal abnormality with associated enhancement of the first proximal phalanx and the distal 2/3 of the first metatarsal. These findings could relate to interval postsurgical change, however, osteomyelitis can not be excluded. 3. The soft tissue defect/wound appears to extend deep to the medial plantar margin of the second metatarsal head which demonstrates marrow signal abnormality with mild enhancement, which could reflect interval postsurgical change, however, osteomyelitis can not be excluded. 4. Prior amputation of second ray at the level of the second MTP joint. Electronically Signed   By: Mannie Seek M.D.   On: 12/27/2023 17:23   DG Foot Complete Left Result Date: 12/27/2023 CLINICAL DATA:  Infection status post second toe resection EXAM: LEFT FOOT - COMPLETE 3+ VIEW COMPARISON:  December 13, 2023 FINDINGS: There is no evidence of fracture or dislocation. There is no evidence of arthropathy or other focal bone abnormality. Soft tissues are unremarkable. IMPRESSION: CORTICAL  IRREGULARITY IS NOTED WITHIN THE MEDIAL CORTEX OF THE PROXIMAL PHALANX OF THE FIRST TOE.  COULD CORRELATE WITH RECENT TRAUMA AND SMALL CORTICAL FRACTURE.: IMPRESSION: CORTICAL IRREGULARITY IS NOTED WITHIN THE MEDIAL CORTEX OF THE PROXIMAL PHALANX OF THE FIRST TOE. COULD CORRELATE WITH RECENT TRAUMA AND SMALL CORTICAL FRACTURE. Post prior second toe amputation.  No evidence of osteomyelitis Electronically Signed   By: Fredrich Jefferson M.D.   On: 12/27/2023 11:51   DG MINI C-ARM IMAGE ONLY Result Date: 12/16/2023 There is no interpretation for this exam.  This order is for images obtained during a surgical procedure.  Please See "Surgeries" Tab for more information regarding the procedure.   MR FOOT LEFT WO CONTRAST Result Date: 12/13/2023 CLINICAL DATA:  Left plantar foot wound.  Concern for osteomyelitis. EXAM: MRI OF THE LEFT FOOT WITHOUT CONTRAST TECHNIQUE: Multiplanar, multisequence MR imaging of the left foot was performed. No intravenous contrast was administered. COMPARISON:  left foot radiographs dated 12/13/2023. MR left foot dated 03/11/2023. FINDINGS: Bones/Joint/Cartilage No marrow signal abnormality identified to suggest osteomyelitis. 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 No acute fracture or dislocation. Mild edema of the lateral hallux sesamoid without associated T1 marrow signal abnormality. Small first MTP joint effusion. Mild degenerative changes of the first MTP joint. Ligaments Collateral ligaments are intact.  Lisfranc ligament is intact. Muscles and Tendons Flexor and extensor compartments are intact. No significant tenosynovitis. Diffuse atrophy of the intrinsic musculature of the foot, likely reflecting chronic denervation changes. Soft tissue Plantar soft tissue ulceration underlying the first MTP joint. There is surrounding soft tissue edema and ill-defined fluid. No discrete loculated collection. IMPRESSION: 1. Plantar soft tissue ulceration underlying the first MTP joint.  Surrounding soft tissue swelling with ill-defined fluid is concerning for cellulitis with phlegmonous changes. No loculated collection identified. 2. Mild marrow edema of the lateral hallux sesamoid, similar to the prior exam. This could reflect sesamoiditis or reactive marrow change. Early osteomyelitis can not be entirely excluded. 3. Small first MTP joint effusion may be reactive, however, septic arthritis can not be excluded. 4. 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 5. Mild degenerative changes of the midfoot and first MTP joint. Electronically Signed   By: Mannie Seek M.D.   On: 12/13/2023 12:27   DG Foot Complete Left Result Date: 12/13/2023 CLINICAL DATA:  Left foot ulcer to the plantar distal first metatarsal, evaluating for possible bone involvement EXAM: LEFT FOOT - COMPLETE 3+ VIEW COMPARISON:  10/08/2023. FINDINGS: There is diffuse osteopenia of the visualized osseous structures. No acute fracture or dislocation. No aggressive osseous lesion. Note is again made of amputation of second toe at the level of metatarsophalangeal joint. Mild diffuse degenerative changes of imaged bones. There is an approximately 1.0 x 1.9 cm lucency overlying the plantar aspect of the head of second metatarsal region with associated surrounding soft tissue swelling. However, ulcer does not reach up to the bone surface. Underlying bone is intact. No focal bone erosions. No radiopaque foreign bodies. IMPRESSION: *There is an approximately 1.0 x 1.9 cm lucency overlying the plantar aspect of the head of second metatarsal region with associated surrounding soft tissue swelling. However, ulcer does not reach up to the bone surface. Underlying bone is intact. No focal bone erosions. No radiographic evidence of osteomyelitis. Electronically Signed   By: Beula Brunswick M.D.   On: 12/13/2023 09:53    Microbiology: Recent Results (from the past 240 hours)  Culture, blood (routine x 2)     Status: None  (Preliminary result)   Collection Time: 01/06/24  3:19 AM   Specimen: BLOOD LEFT ARM  Result Value Ref Range Status   Specimen Description BLOOD LEFT ARM  Final   Special Requests   Final    BOTTLES DRAWN AEROBIC AND ANAEROBIC Blood Culture adequate volume   Culture   Final    NO GROWTH 3 DAYS Performed at [facility], [address] [place], [place] [address]    Report Status PENDING  Incomplete  Culture, blood (routine x 2)     Status: None (Preliminary result)   Collection Time: 01/06/24  3:19 AM   Specimen: BLOOD RIGHT ARM  Result Value Ref Range Status   Specimen Description BLOOD RIGHT ARM  Final   Special Requests   Final    BOTTLES DRAWN AEROBIC AND ANAEROBIC Blood Culture adequate volume   Culture   Final    NO GROWTH 3 DAYS Performed at [facility], [address], [place], [place] [address]    Report Status PENDING  Incomplete  MRSA Next Gen by PCR, Nasal     Status: None   Collection Time: 01/06/24  8:20 AM   Specimen: Nasal Mucosa; Nasal Swab  Result Value Ref Range Status   MRSA by PCR Next Gen NOT DETECTED NOT DETECTED Final    Comment: (NOTE) The GeneXpert MRSA Assay (FDA approved for NASAL specimens only), is one component of a comprehensive MRSA colonization surveillance program. It is not intended to diagnose MRSA infection nor to guide or monitor treatment for MRSA infections. Test performance is not FDA approved in patients less than 51 years old. Performed at [facility], [address] [place], [place] [address]      Labs: CBC: Recent Labs  Lab 01/06/24 [phone] 01/07/24 0827 01/07/24 1852 01/08/24 0445 01/09/24 0534  WBC 3.6* 1.1*  1.1* 1.3* 1.4*  1.3* 3.4*  NEUTROABS 2.6 0.5* 0.7* 0.7* 2.2  HGB 8.8* 7.4*  7.4* 7.0* 8.0*  7.9* 8.0*  HCT 27.7* 23.2*  23.2* 22.2* 25.0*  24.3* 24.9*  MCV 84.2 83.8  83.8 83.5 83.3  83.2 82.2  PLT 54* 32*  31* 32* 30*  29* 35*   Basic Metabolic Panel: Recent Labs   Lab 01/03/24 0346 01/04/24 0517 01/06/24 0318 01/07/24 0827 01/08/24 0445 01/09/24 0534  NA 135  137 138 140 139 137  K 3.7 4.2 3.2* 3.2* 3.6 3.6  CL 106 108 109 111 110 109  CO2 20* 21* 20* 22 22 24   GLUCOSE 101* 128* 104* 106* 130* 130*  BUN 10 13 11 8  5* 5*  CREATININE 0.67 0.56* 0.76 0.52* 0.48* 0.64  CALCIUM  7.9* 8.6* 8.1* 7.6* 7.7* 8.2*  MG 1.7 2.0  --  1.4* 1.8 1.5*  PHOS 3.2 2.3*  --  3.2 2.3* 2.5   Liver Function Tests: Recent Labs  Lab 01/03/24 0346 01/04/24 0517  ALBUMIN 2.4* 3.1*   No results for input(s): "LIPASE", "AMYLASE" in the last 168 hours. No results for input(s): "AMMONIA" in the last 168 hours. Cardiac Enzymes: No results for input(s): "CKTOTAL", "CKMB", "CKMBINDEX", "TROPONINI" in the last 168 hours. BNP (last 3 results) No results for input(s): "BNP" in the last 8760 hours. CBG: No results for input(s): "GLUCAP" in the last 168 hours.  Time spent: 35 minutes  Signed:  Althia Atlas  [facility] 01/09/2024 12:00 PM

## 2024-01-09 NOTE — Consult Note (Signed)
 WOC Nurse Consult Note: patient underwent L TMA per Dr. Rosemarie Conquest, last seen by podiatry 01/06/2024 with instructions to leave dressing in place until seen in his office in 1 week  Patient had had issues with bleeding and minimal dehiscence due to walking on L foot when he was told not to  Reason for Consult: L foot post TMA  Wound type: full thickness surgical  Pressure Injury POA: NA  Measurement: see podiatry note  Wound bed: intact staples and sutures with purple discoloration minimal dehiscence  Drainage (amount, consistency, odor)  Periwound:  Dressing procedure/placement/frequency: Leave current dressing in place as per Dr. Rosemarie Conquest unless bleeding through.  If incision is bleeding can remove current dressing (SOAK WITH NS IF STUCK TO WOUND BED) cleanse with saline and apply Xeroform gauze to wound bed, cover with dry gauze and Kerlix roll gauze.  Patient should remain non-weight bearing as instructed by podiatry.   WOC team will not follow.  Patient should keep follow-up with podiatry.    Thank you,    Ronni Colace MSN, RN-BC, [employer] [phone]

## 2024-01-09 NOTE — Evaluation (Signed)
 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 Pt returns to ED after extensive bleeding at surgical dressingt, pt not adherent to NWB as instructed.   Clinical Impressions Pt was seen for OT evaluation this date. Pt lives with a friend in an apartment with 1 STE and access to tub/shower. Pt notes he has been taking sink baths and generally mod indep with ADL and mobility. Pt presents to acute OT demonstrating impaired ADL performance and functional mobility 2/2 decreased activity tolerance, balance, safety awareness, and L residual foot pain (See OT problem list for additional functional deficits). Pt currently requires increased time/effort to complete seated ADL tasks but no direct assist required. CGA for ADL transfers with RW, VC for NWBing with ADL mobility. Pt educated in NWBing and compensatory strategies for standing vs sitting ADL to maximize safety and minimize falls risk. Pt verbalized understanding. Pt would benefit from skilled OT services to address noted impairments and functional limitations (see below for any additional details) in order to maximize safety and independence while minimizing falls risk and caregiver burden. Anticipate the need for follow up OT services upon acute hospital DC.    If plan is discharge home, recommend the following:   A little help with walking and/or transfers;A little help with bathing/dressing/bathroom;Assist for transportation;Help with stairs or ramp for entrance;Assistance with cooking/housework     Functional Status Assessment   Patient has had a recent decline in their functional status and demonstrates the ability to make significant  improvements in function in a reasonable and predictable amount of time.     Equipment Recommendations   None recommended by OT      Precautions/Restrictions   Precautions Precautions: Fall Restrictions Weight Bearing Restrictions Per Provider Order: Yes LLE Weight Bearing Per Provider Order: Non weight bearing Other Position/Activity Restrictions: post op shoe     Mobility Bed Mobility Overal bed mobility: Independent          ADL either performed or assessed with clinical judgement   ADL Overall ADL's : Modified independent          General ADL Comments: Pt indep with post op shoe, Mod indep with LB dressing, supv-CGA for ADL mobility wiht RW requiring VC for NWBign, mod indep for seated sponge bath      Pertinent Vitals/Pain Pain Assessment Pain Assessment: 0-10 Pain Score: 6  Pain Location: L foot Pain Descriptors / Indicators: Aching, Grimacing Pain Intervention(s): Limited activity within patient's tolerance, Monitored during session, Premedicated before session, Repositioned     Extremity/Trunk Assessment Upper Extremity Assessment Upper Extremity Assessment: Overall WFL for tasks assessed   Lower Extremity Assessment Lower Extremity Assessment: Defer to PT evaluation;LLE deficits/detail LLE Deficits / Details: post op shoe, pain       Communication Communication Communication: No apparent difficulties   Cognition Arousal: Alert Behavior During Therapy: WFL for tasks assessed/performed Cognition: No apparent impairments      OT - Cognition Comments: grossly WFL to tasks assessed, decr adherence to NWBing        Following commands: Intact       Cueing  General Comments   Cueing Techniques: Verbal cues              Home Living Family/patient expects to be discharged  to:: Private residence (friends apartment) Living Arrangements: Other (Comment) Available Help at Discharge: Friend(s) Type of Home: Apartment Home Access:  Stairs to enter Secretary/administrator of Steps: 1 Entrance Stairs-Rails: None Home Layout: One level     Bathroom Shower/Tub: Chief Strategy Officer: Handicapped height     Home Equipment: Agricultural consultant (2 wheels)          Prior Functioning/Environment Prior Level of Function : Independent/Modified Independent    Mobility Comments: awareness of NWB, better adhereance, stil mixed adherance durign standing rest and transfers ADLs Comments: independent    OT Problem List: Decreased strength;Decreased activity tolerance;Impaired balance (sitting and/or standing);Decreased safety awareness;Pain;Decreased knowledge of precautions;Decreased knowledge of use of DME or AE        OT Goals(Current goals can be found in the care plan section)   Acute Rehab OT Goals Patient Stated Goal: go home OT Goal Formulation: All assessment and education complete, DC therapy   AM-PAC OT "6 Clicks" Daily Activity     Outcome Measure Help from another person eating meals?: None Help from another person taking care of personal grooming?: None Help from another person toileting, which includes using toliet, bedpan, or urinal?: A Little Help from another person bathing (including washing, rinsing, drying)?: None Help from another person to put on and taking off regular upper body clothing?: None Help from another person to put on and taking off regular lower body clothing?: None 6 Click Score: 23   End of Session Nurse Communication: Other (comment) (NT - set up for sponge bath seated, needs shower cap)  Activity Tolerance: Patient tolerated treatment well Patient left: in bed;with call bell/phone within reach  OT Visit Diagnosis: Other abnormalities of gait and mobility (R26.89);Muscle weakness (generalized) (M62.81);Unsteadiness on feet (R26.81);Pain Pain - Right/Left: Left Pain - part of body: Ankle and joints of foot                Time: 4098-1191 OT Time Calculation (min):  19 min Charges:  OT General Charges $OT Visit: 1 Visit OT Evaluation $OT Eval Low Complexity: 1 Low  Berenda Breaker., MPH, MS, OTR/L ascom [phone] 01/09/24, 1:34 PM

## 2024-01-09 NOTE — TOC Transition Note (Signed)
 Transition of Care [facility]) - Discharge Note   Patient Details  Name: Derrick Blanchard MRN: [id number] Date of Birth: Nov 19, 1980  Transition of Care [facility]) CM/SW Contact:  Alexandra Ice, RN Phone Number: 01/09/2024, 11:11 AM   Clinical Narrative:     Patient has discharge order in place. Resources added to AVS. No additional TOC needs identified, will continue to available if additional discharge needs arise.  Final next level of care: Other (comment) (To friend's house) Barriers to Discharge: Barriers Resolved   Patient Goals and CMS Choice            Discharge Placement                       Discharge Plan and Services Additional resources added to the After Visit Summary for       Post Acute Care Choice: NA            DME Agency: NA       HH Arranged: NA          Social Drivers of Health (SDOH) Interventions SDOH Screenings   Food Insecurity: Food Insecurity Present (01/06/2024)  Housing: High Risk (01/06/2024)  Transportation Needs: Unmet Transportation Needs (01/06/2024)  Utilities: Patient Declined (01/06/2024)  Recent Concern: Utilities - At Risk (12/27/2023)  Financial Resource Strain: High Risk (03/22/2022)   [facility], [facility]  Social Connections: Patient Declined (12/27/2023)  Tobacco Use: High Risk (01/06/2024)     Readmission Risk Interventions     No data to display

## 2024-01-09 NOTE — Discharge Instructions (Addendum)
 Some PCP options in [place] area- not a comprehensive list  [facility]- [phone] [facility]- [phone] [facility]- [phone] [facility]- [phone] [facility]- [phone] Kerney Pee- [phone]  or [facility] Physician Referral Line [phone]  Intensive Outpatient Programs   [facility] [address] [place],  [place], [place] [phone]  [facility] (Inpatient and outpatient)[phone] (Suboxone and Methadone) [address] [phone]  ADS: Alcohol & Drug [facility] Programs - Intensive Outpatient [address] [place], [place] [address], [place]  [phone]  [facility] (Outpatient, Inpatient, [facility] (Groups and Residental) (insurance only) [phone] [place], [place] [phone]   [facility] (for caregivers and family) [address] Amy Kansky [address] [place], [place] [phone]  Residential Treatment Programs  [facility](2 years) Residential: 69 days)[facility] [address] [place], [place], [place] [phone] or [phone]  [facility] [address] [place], [place], [place] [phone]  [facility] ([facility]) [address] [place], [place], [place] [phone] Admissions: 8am-3pm M-F  [facility] Program: Residential Program [phone] Days)             [facility]: [facility]  [place], [place], [place]  [phone] or [phone] in  Hours over the weekend or by referral)  [facility] [address] [place], [place] [address] [phone] (Do virtual or phone assessment, offer transportation within 25 miles, have in patient and Outpatient options)   Mobil Crisis: [facility]:[phone] (for crisis response 24 hours a day)    Rent/Utility/Housing  Agency Name: [facility] Agency Address: [address] [place], [place] [address] Phone: [phone] Email: [email or web address] Website: www.[email or web address].org Service(s) Offered: Housing services, self-sufficiency, congregate meal program, weatherization program, Field seismologist program, emergency food assistance,  housing counseling, home ownership program, wheels -towork program.  Agency Name: [facility] Address: [address], [place], [place] [address] Phone: [phone] (8a-4p) [phone] (8p- 10p) Email: [email or web address] Website: [email or web address].[email or web address] Service(s) Offered: A program for homeless and/or needy men that includes one-on-one counseling, life skills training and job rehabilitation.  Agency Name: [employer] [facility] [place] Address: [address], [place], [place] [address] Phone: [phone] Website: www.alliedchurches.org Service(s) Offered: Assistance to needy in emergency with utility bills, heating fuel, and prescriptions. Shelter for homeless 7pm-7am. December 29, 2016 15  Agency Name: [facility] [place] (Developmentally Disabled) Address: [address], [place], [place] [address] Phone: [phone] Contact Person: Genie Key Email: [email or web address] Website: LinkWedding.ca Service(s) Offered: Helps individuals with developmental disabilities move from housing that is more restrictive to homes where they  can achieve greater independence and have more  opportunities.  Agency Name: [employer] Address: [address], [place], [place] [address] Phone: [phone] Email: [email or web address] Website: www.burlingtonhousingauthority.org Service(s) Offered: Provides affordable housing for low-income families, elderly, and disabled individuals. Offer a wide range of  programs and services, from financial planning to afterschool and summer programs.  Agency Name: [facility] of Social Services Address: [address] [place], [place] [address] Phone: [phone] Service(s) Offered: Child support services; child welfare services; food stamps; Medicaid; work first family assistance; and aid with fuel,  rent, food and medicine.  Agency Name: [facility] [place] [employer]. Address: [facility]  [address], [place], [place]  [address] Phone: [phone] Website: www.familyabuseservices.org Service(s) Offered: 24 hour Crisis Line: [phone]; 24 hour Emergency Shelter; Transitional Housing; Support Groups; Scientist, physiological; [employer]; Hispanic Outreach: [phone];  Visitation Center: [phone].  Agency Name: [facility] [place]. Address: [address] [place], [place] [address] Phone: [phone] Service(s) Offered: CAP Services; Home and [employer]; Individual or Group Supports; Respite Care Non-Institutional Nursing;  Residential Supports; Respite Care and Personal Care Services; Transportation; Family and Friends Night; Recreational Activities; Three Nutritious Meals/Snacks; Consultation with Registered Dietician; Twenty-four hour Registered Nurse Access; Daily and [employer]; Camp Green Leaves; [place] for the [employer] (During Summer Months) Bingo Night (Every  Wednesday Night); Special Populations Dance Night  (Every Tuesday Night); Professional Hair Care Services.  Agency Name: [facility] Address: [address], [place], [place] [address] Phone: [phone] Contact Person:  Richardo Chandler Website: [email or web address].Physicist, medical) Offered: Residential treatment facility for women; food and  clothing, educational & employment development and  transportation to work; Counsellor of financial skills;  parenting and family reunification; emotional and spiritual  support; transitional housing for program graduates.  Agency Name: [facility] Address: [address] [place], [place] [address] Phone: [phone] Email: [email or web address] Website: TaskTown.es Service(s) Offered: Public housing units for elderly, disabled, and low income people; housing choice vouchers for income eligible  applicants; shelter plus care vouchers; and Psychologist, clinical.  Agency Name: [facility] [facility] [facility] [employer] Address: [address], [place], [place] [address] Phone: [phone] Email: habitat1@netzero .net Website: www.habitatalamance.org Service(s) Offered: Build houses for families in need of decent housing. Each adult in the family must invest 200 hours of labor on  someone else's house, work with volunteers to build their own house, attend classes on budgeting, home maintenance, yard care, and attend homeowner association meetings.  Agency Name: [facility] Address: [address] [place], [place] [address] Phone: [phone] Website: www.rsli.org Service(s) Offered: Intermediate care facilities for intellectually delayed, Supervised Living in group homes for adults with developmental disabilities, Supervised Living for people who have dual diagnoses (MRMI), Independent Living, Supported Living, respite and a variety of CAP services, pre-vocational services, day supports, and [employer].  Agency Name: [place] [employer] Phone: [phone] Website: www.NCForeclosurePrevention.gov Service(s) Offered: Zero-interest, deferred loans to homeowners struggling to  pay their mortgage. Call for more information.

## 2024-01-10 LAB — TYPE AND SCREEN
ABO/RH(D): O POS
Antibody Screen: NEGATIVE
Unit division: 0

## 2024-01-10 LAB — BPAM RBC
Blood Product Expiration Date: 202505202359
ISSUE DATE / TIME: 202505050019
Unit Type and Rh: 202505202359
Unit Type and Rh: 5100

## 2024-01-11 ENCOUNTER — Encounter: Admitting: Podiatry

## 2024-01-11 LAB — CULTURE, BLOOD (ROUTINE X 2)
Culture: NO GROWTH
Culture: NO GROWTH
Special Requests: ADEQUATE
Special Requests: ADEQUATE

## 2024-01-12 ENCOUNTER — Encounter: Admitting: Podiatry

## 2024-01-15 ENCOUNTER — Inpatient Hospital Stay

## 2024-01-15 ENCOUNTER — Encounter: Payer: Self-pay | Admitting: Emergency Medicine

## 2024-01-15 ENCOUNTER — Inpatient Hospital Stay
Admission: AD | Admit: 2024-01-15 | Discharge: 2024-01-23 | DRG: 565 | Disposition: A | Discharge status: Skilled Nursing Facility | Attending: Internal Medicine | Admitting: Internal Medicine

## 2024-01-15 ENCOUNTER — Other Ambulatory Visit: Payer: Self-pay

## 2024-01-15 DIAGNOSIS — R531 Weakness: Secondary | ICD-10-CM | POA: Diagnosis present

## 2024-01-15 DIAGNOSIS — D61818 Other pancytopenia: Secondary | ICD-10-CM | POA: Diagnosis present

## 2024-01-15 DIAGNOSIS — Z91199 Patient's noncompliance with other medical treatment and regimen due to unspecified reason: Secondary | ICD-10-CM | POA: Diagnosis not present

## 2024-01-15 DIAGNOSIS — M7989 Other specified soft tissue disorders: Secondary | ICD-10-CM | POA: Diagnosis present

## 2024-01-15 DIAGNOSIS — D649 Anemia, unspecified: Secondary | ICD-10-CM | POA: Diagnosis present

## 2024-01-15 DIAGNOSIS — F10239 Alcohol dependence with withdrawal, unspecified: Secondary | ICD-10-CM | POA: Diagnosis not present

## 2024-01-15 DIAGNOSIS — M79605 Pain in left leg: Secondary | ICD-10-CM | POA: Diagnosis present

## 2024-01-15 DIAGNOSIS — T8781 Dehiscence of amputation stump: Principal | ICD-10-CM | POA: Diagnosis present

## 2024-01-15 DIAGNOSIS — F1020 Alcohol dependence, uncomplicated: Secondary | ICD-10-CM | POA: Diagnosis present

## 2024-01-15 DIAGNOSIS — Z801 Family history of malignant neoplasm of trachea, bronchus and lung: Secondary | ICD-10-CM | POA: Diagnosis not present

## 2024-01-15 DIAGNOSIS — T8130XA Disruption of wound, unspecified, initial encounter: Secondary | ICD-10-CM | POA: Diagnosis present

## 2024-01-15 DIAGNOSIS — F419 Anxiety disorder, unspecified: Secondary | ICD-10-CM | POA: Diagnosis present

## 2024-01-15 DIAGNOSIS — K7030 Alcoholic cirrhosis of liver without ascites: Secondary | ICD-10-CM | POA: Diagnosis present

## 2024-01-15 DIAGNOSIS — F32A Depression, unspecified: Secondary | ICD-10-CM | POA: Diagnosis present

## 2024-01-15 DIAGNOSIS — Y835 Amputation of limb(s) as the cause of abnormal reaction of the patient, or of later complication, without mention of misadventure at the time of the procedure: Secondary | ICD-10-CM | POA: Diagnosis present

## 2024-01-15 DIAGNOSIS — I10 Essential (primary) hypertension: Secondary | ICD-10-CM | POA: Diagnosis present

## 2024-01-15 DIAGNOSIS — D689 Coagulation defect, unspecified: Secondary | ICD-10-CM | POA: Diagnosis present

## 2024-01-15 DIAGNOSIS — F1090 Alcohol use, unspecified, uncomplicated: Secondary | ICD-10-CM | POA: Diagnosis present

## 2024-01-15 DIAGNOSIS — F17210 Nicotine dependence, cigarettes, uncomplicated: Secondary | ICD-10-CM | POA: Diagnosis present

## 2024-01-15 DIAGNOSIS — K766 Portal hypertension: Secondary | ICD-10-CM | POA: Diagnosis present

## 2024-01-15 LAB — COMPREHENSIVE METABOLIC PANEL WITH GFR
ALT: 17 U/L (ref 0–44)
AST: 31 U/L (ref 15–41)
Albumin: 2.9 g/dL — ABNORMAL LOW (ref 3.5–5.0)
Alkaline Phosphatase: 79 U/L (ref 38–126)
Anion gap: 5 (ref 5–15)
BUN: 14 mg/dL (ref 6–20)
CO2: 24 mmol/L (ref 22–32)
Calcium: 8.5 mg/dL — ABNORMAL LOW (ref 8.9–10.3)
Chloride: 116 mmol/L — ABNORMAL HIGH (ref 98–111)
Creatinine, Ser: 0.78 mg/dL (ref 0.61–1.24)
GFR, Estimated: >60 mL/min (ref >60)
Glucose, Bld: 97 mg/dL (ref 70–99)
Potassium: 4.2 mmol/L (ref 3.5–5.1)
Sodium: 145 mmol/L (ref 135–145)
Total Bilirubin: 1.3 mg/dL — ABNORMAL HIGH (ref 0.0–1.2)
Total Protein: 6.1 g/dL — ABNORMAL LOW (ref 6.5–8.1)

## 2024-01-15 LAB — C-REACTIVE PROTEIN: CRP: <0.5 mg/dL (ref <1.0)

## 2024-01-15 LAB — CBC
HCT: 28.1 % — ABNORMAL LOW (ref 39.0–52.0)
Hemoglobin: 8.5 g/dL — ABNORMAL LOW (ref 13.0–17.0)
MCH: 26 pg (ref 26.0–34.0)
MCHC: 30.2 g/dL (ref 30.0–36.0)
MCV: 85.9 fL (ref 80.0–100.0)
Platelets: 68 10*3/uL — ABNORMAL LOW (ref 150–400)
RBC: 3.27 MIL/uL — ABNORMAL LOW (ref 4.22–5.81)
RDW: 20.6 % — ABNORMAL HIGH (ref 11.5–15.5)
WBC: 3 10*3/uL — ABNORMAL LOW (ref 4.0–10.5)
nRBC: 0 % (ref 0.0–0.2)

## 2024-01-15 LAB — SEDIMENTATION RATE: Sed Rate: 21 mm/h — ABNORMAL HIGH (ref 0–15)

## 2024-01-15 MED ORDER — HYDROMORPHONE HCL 1 MG/ML IJ SOLN
0.5000 mg | INTRAMUSCULAR | Status: DC | PRN
  Administered 2024-01-15 – 2024-01-17 (×4): 0.5 mg via INTRAVENOUS
  Filled 2024-01-15 (×4): qty 0.5

## 2024-01-15 MED ORDER — LORAZEPAM 1 MG PO TABS
1.0000 mg | ORAL_TABLET | ORAL | Status: DC | PRN
  Administered 2024-01-15: 1 mg via ORAL
  Filled 2024-01-15: qty 1

## 2024-01-15 MED ORDER — ADULT MULTIVITAMIN W/MINERALS CH
1.0000 | ORAL_TABLET | Freq: Every day | ORAL | Status: DC
  Administered 2024-01-15 – 2024-01-23 (×8): 1 via ORAL
  Filled 2024-01-15 (×9): qty 1

## 2024-01-15 MED ORDER — FOLIC ACID 1 MG PO TABS
1.0000 mg | ORAL_TABLET | Freq: Every day | ORAL | Status: DC
Start: 2024-01-15 — End: 2024-01-23
  Administered 2024-01-15 – 2024-01-23 (×9): 1 mg via ORAL
  Filled 2024-01-15 (×9): qty 1

## 2024-01-15 MED ORDER — MORPHINE SULFATE (PF) 4 MG/ML IV SOLN
4.0000 mg | Freq: Once | INTRAVENOUS | Status: AC
  Administered 2024-01-15: 4 mg via INTRAVENOUS
  Filled 2024-01-15: qty 1

## 2024-01-15 MED ORDER — DOXYCYCLINE HYCLATE 100 MG PO TABS
100.0000 mg | ORAL_TABLET | Freq: Two times a day (BID) | ORAL | Status: DC
  Administered 2024-01-15 – 2024-01-23 (×17): 100 mg via ORAL
  Filled 2024-01-15 (×17): qty 1

## 2024-01-15 MED ORDER — ENOXAPARIN SODIUM 60 MG/0.6ML IJ SOSY
50.0000 mg | PREFILLED_SYRINGE | INTRAMUSCULAR | Status: DC
  Administered 2024-01-15 – 2024-01-22 (×8): 50 mg via SUBCUTANEOUS
  Filled 2024-01-15 (×8): qty 0.6

## 2024-01-15 MED ORDER — OXYCODONE HCL 5 MG PO TABS
10.0000 mg | ORAL_TABLET | ORAL | Status: DC | PRN
  Administered 2024-01-15 – 2024-01-17 (×7): 10 mg via ORAL
  Filled 2024-01-15 (×7): qty 2

## 2024-01-15 MED ORDER — PANTOPRAZOLE SODIUM 40 MG PO TBEC
40.0000 mg | DELAYED_RELEASE_TABLET | Freq: Every day | ORAL | Status: DC
  Administered 2024-01-16 – 2024-01-23 (×8): 40 mg via ORAL
  Filled 2024-01-15 (×8): qty 1

## 2024-01-15 MED ORDER — LORAZEPAM 2 MG/ML IJ SOLN: 1.0000 mg | INTRAMUSCULAR | Status: DC | PRN

## 2024-01-15 MED ORDER — THIAMINE MONONITRATE 100 MG PO TABS
100.0000 mg | ORAL_TABLET | Freq: Every day | ORAL | Status: DC
  Administered 2024-01-15 – 2024-01-23 (×9): 100 mg via ORAL
  Filled 2024-01-15 (×9): qty 1

## 2024-01-15 MED ORDER — ENOXAPARIN SODIUM 60 MG/0.6ML IJ SOSY: 45.0000 mg | PREFILLED_SYRINGE | INTRAMUSCULAR | Status: DC

## 2024-01-15 MED ORDER — POLYETHYLENE GLYCOL 3350 17 G PO PACK
17.0000 g | PACK | Freq: Every day | ORAL | Status: DC
  Administered 2024-01-15 – 2024-01-16 (×2): 17 g via ORAL
  Filled 2024-01-15 (×2): qty 1

## 2024-01-15 MED ORDER — ONDANSETRON HCL 4 MG/2ML IJ SOLN: 4.0000 mg | Freq: Four times a day (QID) | INTRAMUSCULAR | Status: DC | PRN

## 2024-01-15 MED ORDER — THIAMINE HCL 100 MG/ML IJ SOLN: 100.0000 mg | Freq: Every day | INTRAMUSCULAR | Status: DC

## 2024-01-15 MED ORDER — ONDANSETRON HCL 4 MG/2ML IJ SOLN
4.0000 mg | Freq: Once | INTRAMUSCULAR | Status: AC
  Administered 2024-01-15: 4 mg via INTRAVENOUS
  Filled 2024-01-15: qty 2

## 2024-01-15 MED ORDER — ACETAMINOPHEN 650 MG RE SUPP: 650.0000 mg | Freq: Four times a day (QID) | RECTAL | Status: DC | PRN

## 2024-01-15 MED ORDER — ONDANSETRON HCL 4 MG PO TABS
4.0000 mg | ORAL_TABLET | Freq: Four times a day (QID) | ORAL | Status: DC | PRN
Start: 2024-01-15 — End: 2024-01-23
  Administered 2024-01-19: 4 mg via ORAL
  Filled 2024-01-15: qty 1

## 2024-01-15 MED ORDER — OXYCODONE HCL 5 MG PO TABS
5.0000 mg | ORAL_TABLET | ORAL | Status: DC | PRN
  Administered 2024-01-15: 5 mg via ORAL
  Filled 2024-01-15: qty 1

## 2024-01-15 MED ORDER — ACETAMINOPHEN 325 MG PO TABS
650.0000 mg | ORAL_TABLET | Freq: Three times a day (TID) | ORAL | Status: DC | PRN
  Administered 2024-01-16 – 2024-01-22 (×3): 650 mg via ORAL
  Filled 2024-01-15 (×3): qty 2

## 2024-01-15 NOTE — Assessment & Plan Note (Addendum)
 Will get an ultrasound to rule out DVT.  Pain control with IV and oral medication.

## 2024-01-15 NOTE — Assessment & Plan Note (Signed)
 Alcohol withdrawal protocol.

## 2024-01-15 NOTE — H&P (Signed)
 History and Physical    Patient: Derrick Blanchard:[id number] DOB: [birth year] DOA: 01/15/2024 DOS: the patient was seen and examined on 01/15/2024 PCP: Pcp, No  Patient coming from: Home  Chief Complaint:  Chief Complaint  Patient presents with   Wound Dehiscence   HPI: Derrick Blanchard is a 43 y.o. male with medical history significant of recent left transmet amputation.  Patient states that he has been having pain and he has left foot and leg since yesterday.  He states the morphine  is not helping him.  He ran out of his pain pills.  He has been taking doxycycline .  He stated that his foot wound opened up yesterday.  Came to the ER for further evaluation.  Case discussed with podiatry and he will need to be off his foot for a good period of time.  Will try conservative management with wound care at this point. Review of Systems: Review of Systems  Constitutional:  Negative for chills, fever, malaise/fatigue and weight loss.  HENT:  Negative for hearing loss.   Eyes:  Negative for blurred vision.  Respiratory:  Negative for cough.   Cardiovascular:  Negative for chest pain.  Gastrointestinal:  Positive for abdominal pain, diarrhea and nausea.  Genitourinary:  Negative for dysuria.  Musculoskeletal:  Positive for joint pain and myalgias.  Skin:  Negative for rash.  Neurological:  Negative for dizziness.  Endo/Heme/Allergies:  Bruises/bleeds easily.  Psychiatric/Behavioral:  Negative for depression.     Past Medical History:  Diagnosis Date   Anemia    Anxiety    Cirrhosis (HCC)    Depression    Heart murmur    Hepatitis    Hypertension    Substance abuse [facility])    Past Surgical History:  Procedure Laterality Date   AMPUTATION Left 12/29/2023   Procedure: AMPUTATION, FOOT, RAY;  Surgeon: Evertt Hoe, DPM;  Location: [facility] ORS;  Service: Orthopedics/Podiatry;  Laterality: Left;  Partial first ray amp, abx beads, graft application   COLONOSCOPY      ESOPHAGOGASTRODUODENOSCOPY (EGD) WITH PROPOFOL  N/A 02/03/2015   Procedure: ESOPHAGOGASTRODUODENOSCOPY (EGD) WITH PROPOFOL ;  Surgeon: Luella Sager, MD;  [facility];  Service: Endoscopy;  Laterality: N/A;   ESOPHAGOGASTRODUODENOSCOPY (EGD) WITH PROPOFOL  N/A 12/21/2017   Procedure: ESOPHAGOGASTRODUODENOSCOPY (EGD) WITH PROPOFOL ;  Surgeon: Luke Salaam, MD;  Location: [facility] ENDOSCOPY;  Service: Gastroenterology;  Laterality: N/A;   FISSURECTOMY     IRRIGATION AND DEBRIDEMENT FOOT Left 12/16/2023   Procedure: IRRIGATION AND DEBRIDEMENT FOOT, possible sesamoidectomy, bone biopsy;  Surgeon: Reina Cara, DPM;  Location: [facility] ORS;  Service: Podiatry;  Laterality: Left;  Pulse lavage, bone biopsy with jam shidi needle, mini c arm, sagittal saw available   Right transmet amputation Right    TRANSMETATARSAL AMPUTATION Left 01/03/2024   Procedure: AMPUTATION, FOOT, TRANSMETATARSAL;  Surgeon: Evertt Hoe, DPM;  Location: [facility] ORS;  Service: Orthopedics/Podiatry;  Laterality: Left;  Left foot Trans met amputation   Social History:  reports that he has been smoking cigarettes. He has never used smokeless tobacco. He reports current alcohol use. He reports current drug use. Drug: Marijuana.  No Known Allergies  Family History  Problem Relation Age of Onset   Lung cancer Mother    Cirrhosis Father    Lung cancer Paternal Grandfather     Prior to Admission medications   Medication Sig Start Date End Date Taking? Authorizing Provider  folic acid  (FOLVITE ) 1 MG tablet Take 1 tablet (1 mg total) by mouth daily. 12/21/23  Yes Alexander, Natalie, DO  Multiple Vitamin (MULTIVITAMIN WITH MINERALS) TABS tablet Take 1 tablet by mouth daily. 12/22/23  Yes Alexander, Natalie, DO  pantoprazole  (PROTONIX ) 40 MG tablet Take 1 tablet (40 mg total) by mouth daily. 12/21/23  Yes Alexander, Natalie, DO  polyethylene glycol powder (GLYCOLAX /MIRALAX ) 17 GM/SCOOP powder Take 17 g by mouth daily.  01/04/24  Yes Dezii, Alexandra, DO  thiamine  (VITAMIN B-1) 100 MG tablet Take 1 tablet (100 mg total) by mouth daily. 12/21/23  Yes Melodi Sprung, DO    Physical Exam: Vitals:   01/15/24 1006 01/15/24 1017 01/15/24 1153 01/15/24 1240  BP:  104/64 108/68 108/71  Pulse:  78 76 85  Resp:  16 16 18   Temp:  98 F (36.7 C) 98 F (36.7 C) 97.8 F (36.6 C)  TempSrc:   Oral Oral  SpO2:  98% 98% 100%  Weight: 98 kg     Height: 5' 7.5" (1.715 m)      Physical Exam HENT:     Head: Normocephalic.     Mouth/Throat:     Pharynx: No oropharyngeal exudate.  Eyes:     General: Lids are normal.     Conjunctiva/sclera: Conjunctivae normal.  Cardiovascular:     Rate and Rhythm: Normal rate and regular rhythm.     Heart sounds: Normal heart sounds, S1 normal and S2 normal.  Pulmonary:     Breath sounds: No decreased breath sounds, wheezing, rhonchi or rales.  Abdominal:     Palpations: Abdomen is soft.     Tenderness: There is no abdominal tenderness.  Musculoskeletal:     Right lower leg: Swelling present.     Left lower leg: Swelling present.  Skin:    General: Skin is warm.     Comments: See picture below for foot Chronic lower extremity skin discoloration.  Neurological:     Mental Status: He is alert and oriented to person, place, and time.     Data Reviewed: Sodium 145, potassium 4.2, chloride 116, CO2 24 calcium  8.5, BUN 14, creatinine 0.78, albumin 2.9, total bilirubin 1.3, AST and ALT 31 and 17, white blood count 3.0, hemoglobin 8.5, platelet count 68  Assessment and Plan: * Wound dehiscence Case discussed with podiatry.  Hold off on imaging at this point.  Hold off on IV antibiotics.  Check a sedimentation rate and CRP.  Started off with wound care.  Will get wound care consultation.  Wound will have to close from the inside out.  Pain control with IV and oral medication.  Will continue doxycycline .  Left leg pain Will get an ultrasound to rule out DVT.  Pain control with  IV and oral medication.  ALC (alcoholic liver cirrhosis) (HCC) Patient also has pancytopenia, coagulopathy, portal hypertension  Alcohol use disorder Alcohol withdrawal protocol.      Advance Care Planning: Full code  Consults: Podiatry    Severity of Illness: The appropriate patient status for this patient is INPATIENT. Inpatient status is judged to be reasonable and necessary in order to provide the required intensity of service to ensure the patient's safety. The patient's presenting symptoms, physical exam findings, and initial radiographic and laboratory data in the context of their chronic comorbidities is felt to place them at high risk for further clinical deterioration. Furthermore, it is not anticipated that the patient will be medically stable for discharge from the hospital within 2 midnights of admission.   * I certify that at the point of admission it is my clinical judgment  that the patient will require inpatient hospital care spanning beyond 2 midnights from the point of admission due to high intensity of service, high risk for further deterioration and high frequency of surveillance required.*  Author: Verla Glaze, MD 01/15/2024 12:49 PM  For on call review [email or web address].

## 2024-01-15 NOTE — ED Notes (Signed)
 Foot redressed with non stick dressing and conform Tolerated well

## 2024-01-15 NOTE — Consult Note (Addendum)
 WOC Nurse Consult Note: Reason for Consult: Requested to assess a dehiscence. Wound type: Amputation and dehiscence on left foot. Pressure Injury POA: NA Measurement: 5 cm x 2 cm x 2 cm Wound bed: 100% dark red Drainage (amount, consistency, odor) Minimum amount, serous drainage. Periwound: surgical staples, necrosis on the left side of the suture. Swelling. Dressing procedure/placement/frequency: Cleanse with Vashe, applied Alginate on the wound bed. Covered with gauze and wrap with Kerlix or Stretch bandage. Change in 3 days or PRN. Next change: Cleanse with Vashe (#[id number]), apply Intrasite (#[id number]) and non adherent pad. Covered with gauze and wrap with Kerlix or Stretch bandage. Change daily or PRN.  Conservative sharp wound debridement (CSWD performed at the bedside): Removed 2 loose sutures on the wound bed.  WOC team will not plan to follow further.  Please reconsult if further assistance is needed. Thank-you,  Rachel Budds BSN, RN, [employer], WOC  (Pager: [phone])

## 2024-01-15 NOTE — Assessment & Plan Note (Addendum)
 Case discussed with podiatry.  Hold off on imaging at this point.  Hold off on IV antibiotics.  Check a sedimentation rate and CRP.  Started off with wound care.  Will get wound care consultation.  Wound will have to close from the inside out.  Pain control with IV and oral medication.  Will continue doxycycline .

## 2024-01-15 NOTE — ED Notes (Signed)
 IV pain meds given

## 2024-01-15 NOTE — ED Triage Notes (Signed)
 Presents via EMS He had toes amputated recently for left foot  States he noticed this am that wound was "open" denies any trauma to foot but states he has been walking on foot for short distances

## 2024-01-15 NOTE — ED Provider Notes (Signed)
 [facility] Provider Note    Event Date/Time   First MD Initiated Contact with Patient 01/15/24 1008     (approximate)   History   Wound Dehiscence   HPI  Derrick Blanchard is a 43 y.o. male with history of alcohol cirrhosis, substance abuse, hypertension, osteomyelitis who presents with wound dehiscence.  Patient had transmetatarsal amputation on the left, reports last night wound opened up.  No fevers reported.  Reports he has not been walking on it     Physical Exam   Triage Vital Signs: ED Triage Vitals  Encounter Vitals Group     BP 01/15/24 1017 104/64     Systolic BP Percentile --      Diastolic BP Percentile --      Pulse Rate 01/15/24 1017 78     Resp 01/15/24 1017 16     Temp 01/15/24 1017 98 F (36.7 C)     Temp Source 01/15/24 1153 Oral     SpO2 01/15/24 1017 98 %     Weight 01/15/24 1006 98 kg (216 lb 0.8 oz)     Height 01/15/24 1006 1.715 m (5' 7.5")     Head Circumference --      Peak Flow --      Pain Score 01/15/24 1017 6     Pain Loc --      Pain Education --      Exclude from Growth Chart --     Most recent vital signs: Vitals:   01/15/24 1153 01/15/24 1240  BP: 108/68 108/71  Pulse: 76 85  Resp: 16 18  Temp: 98 F (36.7 C) 97.8 F (36.6 C)  SpO2: 98% 100%     General: Awake, no distress. CV:  Good peripheral perfusion.  Resp:  Normal effort.  Abd:  No distention.  Other:     ED Results / Procedures / Treatments   Labs (all labs ordered are listed, but only abnormal results are displayed) Labs Reviewed  CBC - Abnormal; Notable for the following components:      Result Value   WBC 3.0 (*)    RBC 3.27 (*)    Hemoglobin 8.5 (*)    HCT 28.1 (*)    RDW 20.6 (*)    Platelets 68 (*)    All other components within normal limits  COMPREHENSIVE METABOLIC PANEL WITH GFR - Abnormal; Notable for the following components:   Chloride 116 (*)    Calcium  8.5 (*)    Total Protein 6.1 (*)    Albumin 2.9 (*)     Total Bilirubin 1.3 (*)    All other components within normal limits  SEDIMENTATION RATE - Abnormal; Notable for the following components:   Sed Rate 21 (*)    All other components within normal limits  C-REACTIVE PROTEIN     EKG     RADIOLOGY     PROCEDURES:  Critical Care performed:   Procedures   MEDICATIONS ORDERED IN ED: Medications  HYDROmorphone  (DILAUDID ) injection 0.5 mg (0.5 mg Intravenous Given 01/15/24 1310)  oxyCODONE  (Oxy IR/ROXICODONE ) immediate release tablet 5 mg (has no administration in time range)  LORazepam  (ATIVAN ) tablet 1-4 mg (has no administration in time range)    Or  LORazepam  (ATIVAN ) injection 1-4 mg (has no administration in time range)  thiamine  (VITAMIN B1) tablet 100 mg (100 mg Oral Given 01/15/24 1302)    Or  thiamine  (VITAMIN B1) injection 100 mg ( Intravenous See Alternative 01/15/24 1302)  folic acid  (FOLVITE ) tablet 1 mg (1 mg Oral Given 01/15/24 1302)  multivitamin with minerals tablet 1 tablet (1 tablet Oral Given 01/15/24 1301)  pantoprazole  (PROTONIX ) EC tablet 40 mg (has no administration in time range)  polyethylene glycol (MIRALAX  / GLYCOLAX ) packet 17 g (17 g Oral Given 01/15/24 1302)  doxycycline  (VIBRA -TABS) tablet 100 mg (100 mg Oral Given 01/15/24 1301)  enoxaparin (LOVENOX) injection 45 mg (has no administration in time range)  acetaminophen  (TYLENOL ) tablet 650 mg (has no administration in time range)    Or  acetaminophen  (TYLENOL ) suppository 650 mg (has no administration in time range)  ondansetron  (ZOFRAN ) tablet 4 mg (has no administration in time range)    Or  ondansetron  (ZOFRAN ) injection 4 mg (has no administration in time range)  morphine  (PF) 4 MG/ML injection 4 mg (4 mg Intravenous Given 01/15/24 1150)  ondansetron  (ZOFRAN ) injection 4 mg (4 mg Intravenous Given 01/15/24 1150)     IMPRESSION / MDM / ASSESSMENT AND PLAN / ED COURSE  I reviewed the triage vital signs and the nursing notes. Patient's  presentation is most consistent with exacerbation of chronic illness.  Patient with very clear wound dehiscence, does not appear infected, vital signs labs reassuring, discussed with podiatry who recommends admission for further evaluation possibly with vascular surgery for BKA versus revision  Have discussed with the hospitalist team for admission        FINAL CLINICAL IMPRESSION(S) / ED DIAGNOSES   Final diagnoses:  Wound dehiscence     Rx / DC Orders   ED Discharge Orders     None        Note:  This document was prepared using Dragon voice recognition software and may include unintentional dictation errors.   Bryson Carbine, MD 01/15/24 [phone]

## 2024-01-15 NOTE — Assessment & Plan Note (Signed)
 Patient also has pancytopenia, coagulopathy, portal hypertension. Hemoglobin drifted down to 7.8.  Will get a type and cross tomorrow morning and recheck CBC.  Running a little slightly high at 54 we will change MiraLAX  over to lactulose  for tomorrow.

## 2024-01-16 DIAGNOSIS — R531 Weakness: Secondary | ICD-10-CM

## 2024-01-16 DIAGNOSIS — T8130XA Disruption of wound, unspecified, initial encounter: Secondary | ICD-10-CM | POA: Diagnosis not present

## 2024-01-16 DIAGNOSIS — K7030 Alcoholic cirrhosis of liver without ascites: Secondary | ICD-10-CM | POA: Diagnosis not present

## 2024-01-16 DIAGNOSIS — F1090 Alcohol use, unspecified, uncomplicated: Secondary | ICD-10-CM | POA: Diagnosis not present

## 2024-01-16 DIAGNOSIS — M79605 Pain in left leg: Secondary | ICD-10-CM | POA: Diagnosis not present

## 2024-01-16 LAB — CBC
HCT: 25.2 % — ABNORMAL LOW (ref 39.0–52.0)
Hemoglobin: 7.8 g/dL — ABNORMAL LOW (ref 13.0–17.0)
MCH: 26 pg (ref 26.0–34.0)
MCHC: 31 g/dL (ref 30.0–36.0)
MCV: 84 fL (ref 80.0–100.0)
Platelets: 49 10*3/uL — ABNORMAL LOW (ref 150–400)
RBC: 3 MIL/uL — ABNORMAL LOW (ref 4.22–5.81)
RDW: 20.1 % — ABNORMAL HIGH (ref 11.5–15.5)
WBC: 1.9 10*3/uL — ABNORMAL LOW (ref 4.0–10.5)
nRBC: 0 % (ref 0.0–0.2)

## 2024-01-16 LAB — BASIC METABOLIC PANEL WITH GFR
Anion gap: 5 (ref 5–15)
BUN: 12 mg/dL (ref 6–20)
CO2: 23 mmol/L (ref 22–32)
Calcium: 8.1 mg/dL — ABNORMAL LOW (ref 8.9–10.3)
Chloride: 112 mmol/L — ABNORMAL HIGH (ref 98–111)
Creatinine, Ser: 0.54 mg/dL — ABNORMAL LOW (ref 0.61–1.24)
GFR, Estimated: >60 mL/min (ref >60)
Glucose, Bld: 81 mg/dL (ref 70–99)
Potassium: 4.1 mmol/L (ref 3.5–5.1)
Sodium: 140 mmol/L (ref 135–145)

## 2024-01-16 LAB — AMMONIA: Ammonia: 54 umol/L — ABNORMAL HIGH (ref 9–35)

## 2024-01-16 MED ORDER — LACTULOSE 10 GM/15ML PO SOLN
20.0000 g | Freq: Every day | ORAL | Status: DC
  Administered 2024-01-17 – 2024-01-21 (×4): 20 g via ORAL
  Filled 2024-01-16 (×7): qty 30

## 2024-01-16 NOTE — NC FL2 (Signed)
 [place]  MEDICAID [id number] LEVEL OF CARE FORM     IDENTIFICATION  Patient Name: Derrick Blanchard Birthdate: [birth year] Sex: male Admission Date (Current Location): 01/15/2024  [facility] and [place] Number:  Chiropodist and Address:  [facility], [address] [place], [place] [address]      Provider Number: [id number]  Attending Physician Name and Address:  Verla Glaze, MD  Relative Name and Phone Number:  Alpheus Arvin [phone]    Current Level of Care: Hospital Recommended Level of Care: Skilled Nursing Facility Prior Approval Number:    Date Approved/Denied:   PASRR Number: [id number]  Discharge Plan: SNF    Current Diagnoses: Patient Active Problem List   Diagnosis Date Noted   Generalized weakness 01/16/2024   Wound dehiscence 01/15/2024   Left leg pain 01/15/2024   Complication of foot amputation stump (HCC) 01/06/2024   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) 01/03/2024   Infection of toe as complication of amputation (HCC) 12/27/2023   Wound of left foot 12/13/2023   Open wound of left lower extremity 12/13/2023   Alcohol use disorder 12/13/2023   Pyogenic inflammation of bone (HCC) 12/13/2023   Hypomagnesemia 03/13/2023   Diarrhea 03/12/2023   Severe sepsis (HCC) 03/11/2023   ALC (alcoholic liver cirrhosis) (HCC) 03/11/2023   Chronic hepatitis C (HCC) 03/11/2023   Cellulitis of left lower extremity 03/11/2023   Ulcer of left foot with muscle involvement without evidence of necrosis (HCC) 03/11/2023   Nausea and vomiting 03/11/2023   Portal hypertension with esophageal varices (HCC) 11/27/2019   Thrombocytopenia, acquired (HCC) 11/27/2019   GERD (gastroesophageal reflux disease) 11/24/2017   Tachycardia 11/24/2017   Hypokalemia 10/25/2017   Thrombocytopenia (HCC) 10/25/2017   Advance care planning 10/25/2017   Depression, major, single episode, moderate (HCC) 10/25/2017   Elevated  liver enzymes 09/28/2017   Hyperbilirubinemia    Alcohol use disorder, severe, dependence (HCC) 02/03/2015   Alcoholic hepatitis 01/30/2015   Hematochezia 01/30/2015   Coagulopathy (HCC) 01/30/2015   Hyponatremia 01/30/2015   Pancytopenia (HCC) 01/30/2015    Orientation RESPIRATION BLADDER Height & Weight     Self, Time, Place, Situation  Normal Continent Weight: 98 kg Height:  5' 7.5" (171.5 cm)  BEHAVIORAL SYMPTOMS/MOOD NEUROLOGICAL BOWEL NUTRITION STATUS      Continent Diet (Regular diet, thin liquid)  AMBULATORY STATUS COMMUNICATION OF NEEDS Skin   Limited Assist Verbally Surgical wounds                       Personal Care Assistance Level of Assistance  Bathing, Dressing, Feeding Bathing Assistance: Limited assistance Feeding assistance: Independent Dressing Assistance: Limited assistance     Functional Limitations Info             SPECIAL CARE FACTORS FREQUENCY  PT (By licensed PT), OT (By licensed OT)     PT Frequency: 5 times per week OT Frequency: 5 times per week            Contractures Contractures Info: Not present    Additional Factors Info  Code Status, Isolation Precautions, Allergies Code Status Info: Full code Allergies Info: NKDA     Isolation Precautions Info: VRE     Current Medications (01/16/2024):  This is the current hospital active medication list Current Facility-Administered Medications  Medication Dose Route Frequency Provider Last Rate Last Admin   acetaminophen  (TYLENOL ) tablet 650 mg  650 mg Oral Q8H PRN Verla Glaze, MD   650 mg  at 01/16/24 0956   Or   acetaminophen  (TYLENOL ) suppository 650 mg  650 mg Rectal Q6H PRN Verla Glaze, MD       doxycycline  (VIBRA -TABS) tablet 100 mg  100 mg Oral Q12H Wieting, Richard, MD   100 mg at 01/16/24 0956   enoxaparin (LOVENOX) injection 50 mg  50 mg Subcutaneous Q24H Merrill, Kristin A, RPH   50 mg at 01/15/24 1738   folic acid  (FOLVITE ) tablet 1 mg  1 mg Oral Daily  Verla Glaze, MD   1 mg at 01/16/24 4540   HYDROmorphone  (DILAUDID ) injection 0.5 mg  0.5 mg Intravenous Q3H PRN Verla Glaze, MD   0.5 mg at 01/16/24 0015   [START ON 01/17/2024] lactulose  (CHRONULAC ) 10 GM/15ML solution 20 g  20 g Oral Daily Wieting, Richard, MD       LORazepam  (ATIVAN ) tablet 1-4 mg  1-4 mg Oral Q1H PRN Verla Glaze, MD   1 mg at 01/15/24 9811   Or   LORazepam  (ATIVAN ) injection 1-4 mg  1-4 mg Intravenous Q1H PRN Verla Glaze, MD       multivitamin with minerals tablet 1 tablet  1 tablet Oral Daily Verla Glaze, MD   1 tablet at 01/16/24 9147   ondansetron  (ZOFRAN ) tablet 4 mg  4 mg Oral Q6H PRN Verla Glaze, MD       Or   ondansetron  (ZOFRAN ) injection 4 mg  4 mg Intravenous Q6H PRN Wieting, Richard, MD       oxyCODONE  (Oxy IR/ROXICODONE ) immediate release tablet 10 mg  10 mg Oral Q4H PRN Verla Glaze, MD   10 mg at 01/16/24 8295   pantoprazole  (PROTONIX ) EC tablet 40 mg  40 mg Oral Daily Verla Glaze, MD   40 mg at 01/16/24 6213   thiamine  (VITAMIN B1) tablet 100 mg  100 mg Oral Daily Verla Glaze, MD   100 mg at 01/16/24 0865   Or   thiamine  (VITAMIN B1) injection 100 mg  100 mg Intravenous Daily Verla Glaze, MD         Discharge Medications: Please see discharge summary for a list of discharge medications.  Relevant Imaging Results:  Relevant Lab Results:   Additional Information [id number]  Alexandra Ice, RN

## 2024-01-16 NOTE — Hospital Course (Signed)
 43 y.o. male with medical history significant of recent left transmet amputation.  Patient states that he has been having pain and he has left foot and leg since yesterday.  He states the morphine  is not helping him.  He ran out of his pain pills.  He has been taking doxycycline .  He stated that his foot wound opened up yesterday.  Came to the ER for further evaluation.  Case discussed with podiatry and he will need to be off his foot for a good period of time.  Will try conservative management with wound care at this point.   5/13.  Patient still having a lot of pain in his foot.  Hemoglobin drifted down to 7.8.  CRP less than 0.5 and ESR 21.  Continue oral antibiotics.  Seen by podiatry and recommended wound care and we will have to close by secondary intention.  Patient will be nonweightbearing on the left foot.  PT and OT evaluations.

## 2024-01-16 NOTE — Evaluation (Signed)
 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 Pt returns to ED after extensive bleeding at surgical dressing, pt not adherent to NWB as instructed.  Clinical Impression  PT/OT co evaluation performed this date due to severe pain and poor endurance. Pt is a pleasant 43 year old male who was admitted for wound dehiscence on L LE with recent TMA on 01/03/24. Pt performs bed mobility with independence and transfers/ambulation with min assist and RW. Pt demonstrates deficits with safety awareness and history of poor compliance of WBing restrictions. Anticipate pt could be WC level, however when discussed with him, he feels his home is not conducive to [facility] management. Would benefit from skilled PT to address above deficits and promote optimal return to PLOF. Pt will continue to receive skilled PT services while admitted and will defer to TOC/care team for updates regarding disposition planning.       If plan is discharge home, recommend the following: A little help with walking and/or transfers;A little help with bathing/dressing/bathroom;Help with stairs or ramp for entrance;Assist for transportation   Can travel by private vehicle   Yes    Equipment Recommendations Wheelchair (measurements PT);BSC/3in1  Recommendations for Other Services       Functional Status Assessment Patient has had a recent decline in their functional status and demonstrates the ability to make significant improvements in function in a reasonable and predictable amount of time.     Precautions / Restrictions Precautions Precautions: Fall Recall of Precautions/Restrictions: Intact Restrictions Weight Bearing Restrictions Per  Provider Order: Yes LLE Weight Bearing Per Provider Order: Non weight bearing      Mobility  Bed Mobility Overal bed mobility: Independent             General bed mobility comments: safe technique. Once seated, good ROM to perform figure 4 and done R shoe    Transfers Overall transfer level: Needs assistance Equipment used: Rolling walker (2 wheels) Transfers: Sit to/from Stand Sit to Stand: Min assist           General transfer comment: cues for safety and once standing, able to maintain correct WBing status    Ambulation/Gait Ambulation/Gait assistance: Min assist Gait Distance (Feet): 40 Feet Assistive device: Rolling walker (2 wheels) Gait Pattern/deviations: Step-to pattern       General Gait Details: hop to gait pattern with ability to maintain correct Wbing status. RW used with heavy cues for safety/sequencing especially during turns. Pt fatigues quickly  Stairs            Wheelchair Mobility     Tilt Bed    Modified Rankin (Stroke Patients Only)       Balance Overall balance assessment: Needs assistance Sitting-balance support: Single extremity supported Sitting balance-Leahy Scale: Good     Standing balance support: Bilateral upper extremity supported, Reliant on assistive device for balance Standing balance-Leahy Scale: Fair                               Pertinent Vitals/Pain Pain Assessment Pain Assessment: 0-10 Pain Score: 8  Pain Location: L foot Pain Descriptors / Indicators: Aching, Grimacing Pain Intervention(s): Limited activity within patient's tolerance, Repositioned, Premedicated before session, Patient requesting pain meds-RN notified, RN  gave pain meds during session    Home Living Family/patient expects to be discharged to:: Private residence Living Arrangements: Non-relatives/Friends Available Help at Discharge: Friend(s);Available PRN/intermittently Type of Home: Apartment Home Access: Stairs to  enter Entrance Stairs-Rails: None Entrance Stairs-Number of Steps: 1   Home Layout: One level Home Equipment: Agricultural consultant (2 wheels)      Prior Function Prior Level of Function : Independent/Modified Independent             Mobility Comments: poor adherence to NWBing, reports walking on it ADLs Comments: reports MOD I-I in ADL/IADL     Extremity/Trunk Assessment   Upper Extremity Assessment Upper Extremity Assessment: Overall WFL for tasks assessed    Lower Extremity Assessment Lower Extremity Assessment: Generalized weakness (B LE grossly 3+/5) LLE Deficits / Details: LLE wrapped in ace bandage/dressing       Communication   Communication Communication: No apparent difficulties    Cognition Arousal: Alert Behavior During Therapy: WFL for tasks assessed/performed   PT - Cognitive impairments: History of cognitive impairments, Memory, Awareness, Attention, Problem solving, Safety/Judgement                         Following commands: Intact       Cueing Cueing Techniques: Verbal cues     General Comments General comments (skin integrity, edema, etc.): pt adheres to NWBing RLE throughout    Exercises     Assessment/Plan    PT Assessment Patient needs continued PT services  PT Problem List Decreased strength;Decreased activity tolerance;Decreased balance;Decreased mobility;Pain;Decreased safety awareness       PT Treatment Interventions DME instruction;Gait training;Therapeutic exercise    PT Goals (Current goals can be found in the Care Plan section)  Acute Rehab PT Goals Patient Stated Goal: to go to SNF PT Goal Formulation: With patient Time For Goal Achievement: 01/30/24 Potential to Achieve Goals: Good    Frequency Min 2X/week     Co-evaluation PT/OT/SLP Co-Evaluation/Treatment: Yes Reason for Co-Treatment: Complexity of the patient's impairments (multi-system involvement);Necessary to address cognition/behavior during  functional activity PT goals addressed during session: Mobility/safety with mobility;Proper use of DME OT goals addressed during session: ADL's and self-care       AM-PAC PT "6 Clicks" Mobility  Outcome Measure Help needed turning from your back to your side while in a flat bed without using bedrails?: None Help needed moving from lying on your back to sitting on the side of a flat bed without using bedrails?: None Help needed moving to and from a bed to a chair (including a wheelchair)?: A Little Help needed standing up from a chair using your arms (e.g., wheelchair or bedside chair)?: A Little Help needed to walk in hospital room?: A Little Help needed climbing 3-5 steps with a railing? : A Lot 6 Click Score: 19    End of Session Equipment Utilized During Treatment: Gait belt Activity Tolerance: Patient tolerated treatment well Patient left: in chair;with chair alarm set;with call bell/phone within reach Nurse Communication: Mobility status PT Visit Diagnosis: Muscle weakness (generalized) (M62.81);Difficulty in walking, not elsewhere classified (R26.2);Pain Pain - Right/Left: Left Pain - part of body: Ankle and joints of foot    Time: 1315-1334 PT Time Calculation (min) (ACUTE ONLY): 19 min   Charges:   PT Evaluation $PT Eval Low Complexity: 1 Low   PT General Charges $$ ACUTE PT VISIT: 1 Visit         Amparo Balk, PT, DPT, GCS [phone]  Virgle Arth 01/16/2024, 3:26 PM

## 2024-01-16 NOTE — Consult Note (Addendum)
 PODIATRY CONSULTATION  NAME KELECHI HOEHN MRN [id number] DOB Jul 10, 1981 DOA 01/15/2024   Reason for consult:  Chief Complaint  Patient presents with   Wound Dehiscence    Attending/Consulting physician: Vernelle Goldstein MD  History of present illness: 43 y.o. male with history alcohol cirrhosis substance abuse hypertension osteomyelitis status post prior transmetatarsal amputation that was performed 01/03/2024.  Patient has been walking on the amputation site against recommendation since the procedure.  He has been warned numerous times this will lead to dehiscence and ultimately leading to proximal limb amputation.  He continues to ignore these warnings and walk on the foot as well as he admitted that he did get the amputation site wet against recommendations as well when he took a shower.  He reports significant pain in the left foot though he does not display active signs of being in pain.  Past Medical History:  Diagnosis Date   Anemia    Anxiety    Cirrhosis (HCC)    Depression    Heart murmur    Hepatitis    Hypertension    Substance abuse (HCC)        Latest Ref Rng & Units 01/16/2024    4:33 AM 01/15/2024   10:49 AM 01/09/2024    5:34 AM  CBC  WBC 4.0 - 10.5 K/uL 1.9  3.0  3.4   Hemoglobin 13.0 - 17.0 g/dL 7.8  8.5  8.0   Hematocrit 39.0 - 52.0 % 25.2  28.1  24.9   Platelets 150 - 400 K/uL 49  68  35        Latest Ref Rng & Units 01/16/2024    4:33 AM 01/15/2024   10:49 AM 01/09/2024    5:34 AM  BMP  Glucose 70 - 99 mg/dL 81  97  409   BUN 6 - 20 mg/dL 12  14  5    Creatinine 0.61 - 1.24 mg/dL 8.11  9.14  7.82   Sodium 135 - 145 mmol/L 140  145  137   Potassium 3.5 - 5.1 mmol/L 4.1  4.2  3.6   Chloride 98 - 111 mmol/L 112  116  109   CO2 22 - 32 mmol/L 23  24  24    Calcium  8.9 - 10.3 mg/dL 8.1  8.5  8.2       Physical Exam: Lower Extremity Exam Dehiscence and maceration of the medial aspect of the transmetatarsal amputation site approximately 5 to 7 cm  No  purulent drainage, mild malodor mild erythema surrounding. Fibrotic and granular tissue mix present wound bed DP and PT pulses palpable sensation is intact to the amputation site     ASSESSMENT/PLAN OF CARE 43 y.o. male with PMHx significant for  lcohol cirrhosis, substance abuse, hypertension, osteomyelitis  with dehiscence of the right foot transmet amputation site medially approximately 5 to 7 cm with fibrotic and granular tissue present in the wound bed    - No surgical plans.  Patient will now need to heal his TMA amputation site via secondary intention with ongoing local wound care.  Unfortunately this is likely to be unsuccessful given his noncompliance with his weightbearing status as well as keeping the surgical site dry.  Should he become further infected or there is further dehiscence and/or bleeding I recommend proximal limb amputation.  He says he would want to do this at [facility] and he is welcome to follow-up there as he pleases for evaluation for revision versus proximal limb amputation. -Removed loose staples  from the medial aspect of the incision left the lateral and staples intact as it is still coapted there - Recommend short course of p.o. antibiotics - Anticoagulation: Per primary okay to continue - Wound care: Daily dressing change recommended with Aquacel Ag contact layer for drainage followed by 4 x 4 gauze Kerlix roll to wrap and Ace wrap to secure -order entered - WB status: Nonweightbearing to the left lower extremity this has been discussed numerous times with the patient he is noncompliant - Will sign off patient to follow-up with wound care center for with [facility] for proximal limb amputation per his preference   Thank you for the consult.  Please contact me directly with any questions or concerns.           Maridee Shoemaker, DPM [facility]    [address] [place], [place] [address]                 Office [phone]  Fax [phone]

## 2024-01-16 NOTE — Evaluation (Signed)
 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 Pt returns to ED after extensive bleeding at surgical dressing, pt not adherent to NWB as instructed.     Clinical Impressions Chart reviewed to date, pt greeted in bed, agreeable to OT evaluation. Pt is alert and oriented x4. Pt reports he is concerned regarding performing ADLs/functional mobility at home. He reports he was walking on his LLE after previous discharge. Pt reports limited support after discharge even if he were to discharge home at a wheelchair level. Pt requires MIN A for STS, amb with RW household distances with MIN A, Supervision for LB dressing (RLE shoe), SET UP for grooming tasks. Pt is performing below PLOF, will benefit from acute OT address functional deficits and to facilitate optimal ADL performance. Pt is left in bedside chair, all needs met. OT will follow acutely.      If plan is discharge home, recommend the following:   A little help with walking and/or transfers;A little help with bathing/dressing/bathroom;Assist for transportation;Help with stairs or ramp for entrance;Assistance with cooking/housework     Functional Status Assessment   Patient has had a recent decline in their functional status and demonstrates the ability to make significant improvements in function in a reasonable and predictable amount of time.     Equipment Recommendations   Wheelchair (measurements OT);BSC/3in1     Recommendations for Other Services         Precautions/Restrictions   Precautions Precautions: Fall Recall of Precautions/Restrictions: Intact Restrictions Weight Bearing Restrictions Per Provider Order:  Yes LLE Weight Bearing Per Provider Order: Non weight bearing     Mobility Bed Mobility Overal bed mobility: Independent                  Transfers Overall transfer level: Needs assistance Equipment used: Rolling walker (2 wheels) Transfers: Sit to/from Stand Sit to Stand: Min assist                  Balance Overall balance assessment: Needs assistance Sitting-balance support: Single extremity supported Sitting balance-Leahy Scale: Good     Standing balance support: Bilateral upper extremity supported, Reliant on assistive device for balance Standing balance-Leahy Scale: Fair                             ADL either performed or assessed with clinical judgement   ADL Overall ADL's : Needs assistance/impaired Eating/Feeding: Set up;Sitting   Grooming: Wash/dry face;Sitting;Set up           Upper Body Dressing : Sitting;Supervision/safety   Lower Body Dressing: Supervision/safety;Sitting/lateral leans Lower Body Dressing Details (indicate cue type and reason): donn R shoe Toilet Transfer: Minimal assistance;Rolling walker (2 wheels) Toilet Transfer Details (indicate cue type and reason): simulated         Functional mobility during ADLs: Minimal assistance;Cueing for safety;Cueing for sequencing;Rollator (4 wheels)       Vision Patient Visual Report: No change from baseline       Perception         Praxis         Pertinent Vitals/Pain Pain Assessment Pain Assessment: 0-10 Pain Score: 8  Pain Location: L foot Pain Descriptors / Indicators: Aching, Grimacing Pain  Intervention(s): Monitored during session, Premedicated before session, Limited activity within patient's tolerance, RN gave pain meds during session     Extremity/Trunk Assessment Upper Extremity Assessment Upper Extremity Assessment: Overall WFL for tasks assessed   Lower Extremity Assessment Lower Extremity Assessment: LLE deficits/detail LLE Deficits /  Details: LLE wrapped in ace bandage/dressing       Communication Communication Communication: No apparent difficulties   Cognition Arousal: Alert Behavior During Therapy: WFL for tasks assessed/performed Cognition: No apparent impairments                               Following commands: Intact       Cueing  General Comments   Cueing Techniques: Verbal cues  pt adheres to NWBing RLE throughout   Exercises Other Exercises Other Exercises: edu pt re: role of OT, role of rehab, discharge recommendations, safe ADL completion with NWBing status; pt reports he is concerned about ADL/mobility at home   Shoulder Instructions      Home Living Family/patient expects to be discharged to:: Private residence Living Arrangements: Non-relatives/Friends Available Help at Discharge: Friend(s);Available PRN/intermittently Type of Home: Apartment Home Access: Stairs to enter Entrance Stairs-Number of Steps: 1 Entrance Stairs-Rails: None Home Layout: One level     Bathroom Shower/Tub: Chief Strategy Officer: Handicapped height Bathroom Accessibility: No   Home Equipment: Agricultural consultant (2 wheels)          Prior Functioning/Environment Prior Level of Function : Independent/Modified Independent             Mobility Comments: poor adherence to NWBing, reports walking on it ADLs Comments: reports MOD I-I in ADL/IADL    OT Problem List: Decreased strength;Decreased activity tolerance;Impaired balance (sitting and/or standing);Decreased safety awareness;Pain;Decreased knowledge of precautions;Decreased knowledge of use of DME or AE   OT Treatment/Interventions: Self-care/ADL training;Therapeutic exercise;Energy conservation;DME and/or AE instruction;Therapeutic activities;Patient/family education;Balance training      OT Goals(Current goals can be found in the care plan section)   Acute Rehab OT Goals Patient Stated Goal: rehab OT Goal Formulation:  With patient Time For Goal Achievement: 01/30/24 Potential to Achieve Goals: Fair ADL Goals Pt Will Perform Grooming: with modified independence;sitting Pt Will Perform Lower Body Dressing: with modified independence;sitting/lateral leans Pt Will Transfer to Toilet: with modified independence Pt Will Perform Toileting - Clothing Manipulation and hygiene: with modified independence;sitting/lateral leans   OT Frequency:  Min 2X/week    Co-evaluation PT/OT/SLP Co-Evaluation/Treatment: Yes Reason for Co-Treatment: Complexity of the patient's impairments (multi-system involvement);Necessary to address cognition/behavior during functional activity   OT goals addressed during session: ADL's and self-care      AM-PAC OT "6 Clicks" Daily Activity     Outcome Measure Help from another person eating meals?: None Help from another person taking care of personal grooming?: None Help from another person toileting, which includes using toliet, bedpan, or urinal?: A Little Help from another person bathing (including washing, rinsing, drying)?: None Help from another person to put on and taking off regular upper body clothing?: None Help from another person to put on and taking off regular lower body clothing?: None 6 Click Score: 23   End of Session Equipment Utilized During Treatment: Rolling walker (2 wheels) Nurse Communication: Mobility status  Activity Tolerance: Patient tolerated treatment well Patient left: with call bell/phone within reach;in chair;with chair alarm set  OT Visit Diagnosis: Other abnormalities of gait and mobility (R26.89);Muscle weakness (generalized) (M62.81);Unsteadiness on feet (R26.81);Pain Pain -  Right/Left: Left Pain - part of body: Ankle and joints of foot                Time: 1315-1334 OT Time Calculation (min): 19 min Charges:  OT General Charges $OT Visit: 1 Visit OT Evaluation $OT Eval Low Complexity: 1 Low  Gerre Kraft, OTD OTR/L  01/16/24, 2:22 PM

## 2024-01-16 NOTE — TOC Progression Note (Signed)
 Transition of Care [facility]) - Progression Note    Patient Details  Name: Derrick Blanchard MRN: [id number] Date of Birth: [birth year]  Transition of Care [facility] CM/SW Contact  Alexandra Ice, RN Phone Number: 01/16/2024, 12:44 PM  Clinical Narrative:     Benjaman Branch pending MD signature. PASRR, FL2 and bedsearch completed. Pending bed offers.       Expected Discharge Plan and Services                                               Social Determinants of Health (SDOH) Interventions SDOH Screenings   Food Insecurity: Food Insecurity Present (01/15/2024)  Housing: High Risk (01/15/2024)  Transportation Needs: Unmet Transportation Needs (01/15/2024)  Utilities: Not At Risk (01/15/2024)  Recent Concern: Utilities - At Risk (12/27/2023)  Financial Resource Strain: High Risk (03/22/2022)   Received from [facility], [facility]  Social Connections: Unknown (01/15/2024)  Tobacco Use: High Risk (01/15/2024)    Readmission Risk Interventions     No data to display

## 2024-01-16 NOTE — Assessment & Plan Note (Signed)
 PT and OT evaluations.  Patient is interested in going to rehab

## 2024-01-16 NOTE — Progress Notes (Signed)
 Progress Note   Patient: Derrick Blanchard UJW:[id number] DOB: Nov 13, 1980 DOA: 01/15/2024     1 DOS: the patient was seen and examined on 01/16/2024   Brief hospital course: 43 y.o. male with medical history significant of recent left transmet amputation.  Patient states that he has been having pain and he has left foot and leg since yesterday.  He states the morphine  is not helping him.  He ran out of his pain pills.  He has been taking doxycycline .  He stated that his foot wound opened up yesterday.  Came to the ER for further evaluation.  Case discussed with podiatry and he will need to be off his foot for a good period of time.  Will try conservative management with wound care at this point.   5/13.  Patient still having a lot of pain in his foot.  Hemoglobin drifted down to 7.8.  CRP less than 0.5 and ESR 21.  Continue oral antibiotics.  Seen by podiatry and recommended wound care and we will have to close by secondary intention.  Patient will be nonweightbearing on the left foot.  PT and OT evaluations.  Assessment and Plan: * Wound dehiscence CRP less than 0.5 and ESR 21.  Continue oral antibiotics.  Seen by podiatry and some staples were removed.  Wound will have to close by secondary intention.  Pain control with IV and oral medication.  Will continue doxycycline .  PT and OT evaluations with patient nonweightbearing left leg.  Left leg pain Ultrasound negative for DVT.  Pain control with IV and oral medication.  ALC (alcoholic liver cirrhosis) (HCC) Patient also has pancytopenia, coagulopathy, portal hypertension. Hemoglobin drifted down to 7.8.  Will get a type and cross tomorrow morning and recheck CBC.  Running a little slightly high at 54 we will change MiraLAX  over to lactulose  for tomorrow.  Alcohol use disorder Alcohol withdrawal protocol.  Generalized weakness PT and OT evaluations.  Patient is interested in going to rehab        Subjective: Patient still having  quite a bit of pain in his wound especially after staples were removed.  The patient was thinking that he may need rehab since he is nonweightbearing on the left leg.  Admitted with wound dehiscence.  Physical Exam: Vitals:   01/15/24 1842 01/15/24 1956 01/16/24 0238 01/16/24 0755  BP: 136/76 111/68 116/69 109/80  Pulse: 98 95 75 96  Resp:  18 19 16   Temp:  98.1 F (36.7 C) 97.6 F (36.4 C) 98.4 F (36.9 C)  TempSrc:  Oral  Oral  SpO2:  98% 100% 99%  Weight:      Height:       Physical Exam HENT:     Head: Normocephalic.     Mouth/Throat:     Pharynx: No oropharyngeal exudate.  Eyes:     General: Lids are normal.     Conjunctiva/sclera: Conjunctivae normal.  Cardiovascular:     Rate and Rhythm: Normal rate and regular rhythm.     Heart sounds: Normal heart sounds, S1 normal and S2 normal.  Pulmonary:     Breath sounds: No decreased breath sounds, wheezing, rhonchi or rales.  Abdominal:     Palpations: Abdomen is soft.     Tenderness: There is no abdominal tenderness.  Musculoskeletal:     Right lower leg: Swelling present.     Left lower leg: Swelling present.  Skin:    General: Skin is warm.     Comments: Chronic lower  extremity skin discoloration.  Neurological:     Mental Status: He is alert and oriented to person, place, and time.     Data Reviewed: Creatinine 0.54, ammonia level 54, white blood cell count 1.9, hemoglobin 7.8, platelet count 49 Disposition: Status is: Inpatient Remains inpatient appropriate because: PT and OT evaluations.  Patient may end up needing rehab since he is nonweightbearing on his left leg.  Planned Discharge Destination: Possibly needing rehab    Time spent: 28 minutes  Author: Verla Glaze, MD 01/16/2024 11:23 AM  For on call review [email or web address].

## 2024-01-17 DIAGNOSIS — T8130XA Disruption of wound, unspecified, initial encounter: Secondary | ICD-10-CM | POA: Diagnosis not present

## 2024-01-17 LAB — CBC
HCT: 24.4 % — ABNORMAL LOW (ref 39.0–52.0)
Hemoglobin: 7.7 g/dL — ABNORMAL LOW (ref 13.0–17.0)
MCH: 26.2 pg (ref 26.0–34.0)
MCHC: 31.6 g/dL (ref 30.0–36.0)
MCV: 83 fL (ref 80.0–100.0)
Platelets: 47 10*3/uL — ABNORMAL LOW (ref 150–400)
RBC: 2.94 MIL/uL — ABNORMAL LOW (ref 4.22–5.81)
RDW: 19.7 % — ABNORMAL HIGH (ref 11.5–15.5)
WBC: 1.7 10*3/uL — ABNORMAL LOW (ref 4.0–10.5)
nRBC: 0 % (ref 0.0–0.2)

## 2024-01-17 LAB — TYPE AND SCREEN
ABO/RH(D): O POS
Antibody Screen: NEGATIVE

## 2024-01-17 LAB — GLUCOSE, CAPILLARY: Glucose-Capillary: 124 mg/dL — ABNORMAL HIGH (ref 70–99)

## 2024-01-17 MED ORDER — KETOROLAC TROMETHAMINE 15 MG/ML IJ SOLN
15.0000 mg | Freq: Four times a day (QID) | INTRAMUSCULAR | Status: DC
  Administered 2024-01-17 – 2024-01-18 (×4): 15 mg via INTRAVENOUS
  Filled 2024-01-17 (×4): qty 1

## 2024-01-17 MED ORDER — OXYCODONE HCL 5 MG PO TABS
10.0000 mg | ORAL_TABLET | ORAL | Status: DC | PRN
  Administered 2024-01-17 – 2024-01-23 (×27): 15 mg via ORAL
  Administered 2024-01-23: 10 mg via ORAL
  Filled 2024-01-17 (×13): qty 3
  Filled 2024-01-17: qty 2
  Filled 2024-01-17 (×17): qty 3

## 2024-01-17 MED ORDER — ACETAMINOPHEN 500 MG PO TABS
1000.0000 mg | ORAL_TABLET | Freq: Three times a day (TID) | ORAL | Status: DC
  Administered 2024-01-17 – 2024-01-18 (×3): 1000 mg via ORAL
  Filled 2024-01-17 (×3): qty 2

## 2024-01-17 NOTE — Progress Notes (Signed)
 PROGRESS NOTE    Derrick Blanchard  WUJ:[id number] DOB: 02-Jul-1981 DOA: 01/15/2024 PCP: Pcp, No    Brief Narrative:  43 y.o. male with medical history significant of recent left transmet amputation.  Patient states that he has been having pain and he has left foot and leg since yesterday.  He states the morphine  is not helping him.  He ran out of his pain pills.  He has been taking doxycycline .  He stated that his foot wound opened up yesterday.  Came to the ER for further evaluation.  Case discussed with podiatry and he will need to be off his foot for a good period of time.  Will try conservative management with wound care at this point.    5/13.  Patient still having a lot of pain in his foot.  Hemoglobin drifted down to 7.8.  CRP less than 0.5 and ESR 21.  Continue oral antibiotics.  Seen by podiatry and recommended wound care and we will have to close by secondary intention.  Patient will be nonweightbearing on the left foot.  PT and OT evaluations.  Assessment & Plan:   Principal Problem:   Wound dehiscence Active Problems:   Left leg pain   ALC (alcoholic liver cirrhosis) (HCC)   Pancytopenia (HCC)   Alcohol use disorder   Generalized weakness  * Wound dehiscence CRP less than 0.5 and ESR 21.  Continue oral antibiotics.  Seen by podiatry and some staples were removed.  Wound will have to close by secondary intention.  Multimodal pain control.  Discontinue IV Dilaudid .  Attempted control pain with use of oral medications only.  Continue p.o. doxycycline .  Continue therapy evaluations.  Patient will benefit from placement in skilled nursing facility as next level of care.     Left leg pain Ultrasound negative for DVT.  Multimodal pain control   ALC (alcoholic liver cirrhosis) (HCC) Patient also has pancytopenia, coagulopathy, portal hypertension. Hemoglobin drifted down to 7. 7.  Currently no indication for transfusion.  Monitor hemoglobin.  Transfuse less than 7   Alcohol use  disorder No evidence of acute withdrawal.  Discontinue CIWA protocol   Generalized weakness PT and OT evaluations.  Patient is interested in going to rehab.  Patient will benefit from placement in skilled nursing facility as next level of care.      DVT prophylaxis: SCD Code Status: Full Family Communication: None Disposition Plan: Status is: Inpatient Remains inpatient appropriate because: Wound dehiscence, inadequate pain control   Level of care: Med-Surg  Consultants:  Podiatry  Procedures:  None  Antimicrobials: Doxycycline    Subjective: Seen and examined.  Resting in bed.  Endorses pain in left foot status post TMA  Objective: Vitals:   01/16/24 2046 01/17/24 0157 01/17/24 0410 01/17/24 0800  BP: 131/72 128/73 113/63 111/70  Pulse: 77 81 79 75  Resp: 18 18 18 18   Temp: 98.3 F (36.8 C) 98.1 F (36.7 C) 98.1 F (36.7 C) 98.3 F (36.8 C)  TempSrc:      SpO2: 100% 100% 99% 99%  Weight:      Height:        Intake/Output Summary (Last 24 hours) at 01/17/2024 1242 Last data filed at 01/17/2024 0801 Gross per 24 hour  Intake --  Output 3025 ml  Net -3025 ml   Filed Weights   01/15/24 1006  Weight: 98 kg    Examination:  General exam: Appears calm and comfortable  Respiratory system: Clear to auscultation. Respiratory effort normal. Cardiovascular system:  S1-S2, RRR, no murmurs, no pedal edema Gastrointestinal system: Soft NT/ND, normal bowel sounds Central nervous system: Alert and oriented. No focal neurological deficits. Extremities: Status post bilateral TMA Skin: No rashes, lesions or ulcers Psychiatry: Judgement and insight appear normal. Mood & affect appropriate.     Data Reviewed: I have personally reviewed following labs and imaging studies  CBC: Recent Labs  Lab 01/15/24 1049 01/16/24 0433 01/17/24 0536  WBC 3.0* 1.9* 1.7*  HGB 8.5* 7.8* 7.7*  HCT 28.1* 25.2* 24.4*  MCV 85.9 84.0 83.0  PLT 68* 49* 47*   Basic Metabolic  Panel: Recent Labs  Lab 01/15/24 1049 01/16/24 0433  NA 145 140  K 4.2 4.1  CL 116* 112*  CO2 24 23  GLUCOSE 97 81  BUN 14 12  CREATININE 0.78 0.54*  CALCIUM  8.5* 8.1*   GFR: Estimated Creatinine Clearance: 138.2 mL/min (A) (by C-G formula based on SCr of 0.54 mg/dL (L)). Liver Function Tests: Recent Labs  Lab 01/15/24 1049  AST 31  ALT 17  ALKPHOS 79  BILITOT 1.3*  PROT 6.1*  ALBUMIN 2.9*   No results for input(s): "LIPASE", "AMYLASE" in the last 168 hours. Recent Labs  Lab 01/16/24 0433  AMMONIA 54*   Coagulation Profile: No results for input(s): "INR", "PROTIME" in the last 168 hours. Cardiac Enzymes: No results for input(s): "CKTOTAL", "CKMB", "CKMBINDEX", "TROPONINI" in the last 168 hours. BNP (last 3 results) No results for input(s): "PROBNP" in the last 8760 hours. HbA1C: No results for input(s): "HGBA1C" in the last 72 hours. CBG: No results for input(s): "GLUCAP" in the last 168 hours. Lipid Profile: No results for input(s): "CHOL", "HDL", "LDLCALC", "TRIG", "CHOLHDL", "LDLDIRECT" in the last 72 hours. Thyroid Function Tests: No results for input(s): "TSH", "T4TOTAL", "FREET4", "T3FREE", "THYROIDAB" in the last 72 hours. Anemia Panel: No results for input(s): "VITAMINB12", "FOLATE", "FERRITIN", "TIBC", "IRON", "RETICCTPCT" in the last 72 hours. Sepsis Labs: No results for input(s): "PROCALCITON", "LATICACIDVEN" in the last 168 hours.  No results found for this or any previous visit (from the past 240 hours).       Radiology Studies: US  Venous Img Lower Bilateral (DVT) Result Date: 01/15/2024 CLINICAL DATA:  Swelling. EXAM: BILATERAL LOWER EXTREMITY VENOUS DOPPLER ULTRASOUND TECHNIQUE: Gray-scale sonography with compression, as well as color and duplex ultrasound, were performed to evaluate the deep venous system(s) from the level of the common femoral vein through the popliteal and proximal calf veins. COMPARISON:  Left lower extremity duplex  11/07/2018 FINDINGS: VENOUS Normal compressibility of the common femoral, superficial femoral, and popliteal veins, as well as the visualized calf veins. Visualized portions of profunda femoral vein and great saphenous vein unremarkable. No filling defects to suggest DVT on grayscale or color Doppler imaging. Doppler waveforms show normal direction of venous flow, normal respiratory plasticity and response to augmentation. OTHER None. Limitations: none IMPRESSION: No sonographic evidence of bilateral lower extremity DVT. Electronically Signed   By: Chadwick Colonel M.D.   On: 01/15/2024 15:48        Scheduled Meds:  acetaminophen   1,000 mg Oral TID   doxycycline   100 mg Oral Q12H   enoxaparin (LOVENOX) injection  50 mg Subcutaneous Q24H   folic acid   1 mg Oral Daily   ketorolac   15 mg Intravenous Q6H   lactulose   20 g Oral Daily   multivitamin with minerals  1 tablet Oral Daily   pantoprazole   40 mg Oral Daily   thiamine   100 mg Oral Daily   Or  thiamine   100 mg Intravenous Daily   Continuous Infusions:   LOS: 2 days     Tiajuana Fluke, MD [facility]   If 7PM-7AM, please contact night-coverage  01/17/2024, 12:42 PM

## 2024-01-17 NOTE — Plan of Care (Signed)

## 2024-01-17 NOTE — Progress Notes (Signed)
 Physical Therapy Treatment Patient Details Name: Derrick Blanchard MRN: [id number] DOB: 1981/05/14 Today's Date: 01/17/2024   History of Present Illness Derrick Blanchard, Derrick Blanchard, Derrick Blanchard, Derrick Blanchard, Derrick Blanchard, Derrick Blanchard, Derrick Blanchard, Derrick Blanchard.    Derrick Comments  Derrick is making gradual progress towards goals with ability to get OOB safely and then ambulate short distance to recliner. Reports he is still having severe pain, notified RN of pain meds request. Encouraged Derrick to sit in recliner with leg elevated. Will continue to progress as able.    If plan is discharge home, recommend the following: A little help with walking and/or transfers;A little help with bathing/Blanchard/bathroom;Help with stairs or ramp for entrance;Assist for transportation   Can travel by private vehicle     Yes  Equipment Recommendations  Wheelchair (measurements Derrick);BSC/3in1    Recommendations for Other Services       Precautions / Restrictions Precautions Precautions: Fall Recall of Precautions/Restrictions: Intact Restrictions Weight Bearing Restrictions Per Provider Order: Yes LLE Weight Bearing Per Provider Order: Non weight bearing Other Position/Activity Restrictions: shoe donned on R foot     Mobility  Bed Mobility Overal bed mobility: Independent             General bed mobility comments: safe technique. Once seated, good ROM to perform figure 4 and done R shoe    Transfers Overall transfer level: Needs assistance Equipment used: Rolling walker (2 wheels) Transfers: Sit to/from Stand Sit to Stand: Min assist           General transfer comment: still requires assist for lift off and cues for ant weight shift prior to standing. RW used    Ambulation/Gait Ambulation/Gait assistance:  Editor, commissioning (Feet): 3 Feet Assistive device: Rolling walker (2 wheels) Gait Pattern/deviations: Step-to pattern       General Gait Details: hop to gait pattern, deferred further distance due to pain. Derrick able to maintain correct WBing status   Stairs             Wheelchair Mobility     Tilt Bed    Modified Rankin (Stroke Patients Only)       Balance Overall balance assessment: Needs assistance Sitting-balance support: Single extremity supported Sitting balance-Leahy Scale: Good     Standing balance support: Bilateral upper extremity supported, Reliant on assistive device for balance Standing balance-Leahy Scale: Fair                              Hotel manager: No apparent difficulties  Cognition Arousal: Alert Behavior During Therapy: WFL for tasks assessed/performed   Derrick - Cognitive impairments: History of cognitive impairments, Memory, Awareness, Attention, Problem solving, Safety/Judgement                       Derrick - Cognition Comments: agreeable to session, less anxiety this date Following commands: Intact      Cueing Cueing Techniques: Verbal cues  Exercises      General Comments        Pertinent Vitals/Pain Pain Assessment Pain Assessment: Faces Faces Pain Scale: Hurts little more Pain Location: L foot Pain Descriptors / Indicators: Aching, Grimacing Pain Intervention(s): Limited activity within patient's tolerance, Repositioned, Patient requesting pain meds-RN notified    Home Living  Prior Function            Derrick Goals (current goals can now be found in the care plan section) Acute Rehab Derrick Goals Patient Stated Goal: to go to SNF Derrick Goal Formulation: With patient Time For Goal Achievement: 01/30/24 Potential to Achieve Goals: Good Progress towards Derrick goals: Progressing toward goals    Frequency    Min 2X/week      Derrick Plan       Co-evaluation              AM-PAC Derrick "6 Clicks" Mobility   Outcome Measure  Help needed turning from your back to your side while in a flat bed without using bedrails?: None Help needed moving from lying on your back to sitting on the side of a flat bed without using bedrails?: None Help needed moving to and from a bed to a chair (including a wheelchair)?: A Little Help needed standing up from a chair using your arms (e.g., wheelchair or bedside chair)?: A Little Help needed to walk in hospital room?: A Little Help needed climbing 3-5 steps with a railing? : A Lot 6 Click Score: 19    End of Session Equipment Utilized During Treatment: Gait belt Activity Tolerance: Patient limited by pain Patient left: in chair;with chair alarm set;with call bell/phone within reach Nurse Communication: Mobility status Derrick Visit Diagnosis: Muscle weakness (generalized) (M62.81);Difficulty in walking, not elsewhere classified (R26.2);Pain Pain - Right/Left: Left Pain - part of body: Ankle and joints of foot     Time: 5784-6962 Derrick Time Calculation (min) (ACUTE ONLY): 10 min  Charges:    $Therapeutic Activity: 8-22 mins Derrick General Charges $$ ACUTE Derrick VISIT: 1 Visit                     Amparo Balk, Derrick, DPT, GCS [phone]    Haston Casebolt 01/17/2024, 5:01 PM

## 2024-01-17 NOTE — TOC Initial Note (Addendum)
 Transition of Care [facility]) - Initial/Assessment Note    Patient Details  Name: Derrick Blanchard MRN: [id number] Date of Birth: Apr 26, 1981  [facility] CM/SW Contact:    Alexandra Ice, RN Phone Number: 01/17/2024, 3:29 PM  Clinical Narrative:                  Patient needs SNF placement for additional rehab. Currently has no accepting SNFs, TOC sent additional referrals. Pending bed offers. Expected Discharge Plan: Skilled Nursing Facility Barriers to Discharge: Continued Medical Work up   Patient Goals and CMS Choice            Expected Discharge Plan and Services   Discharge Planning Services: CM Consult Post Acute Care Choice: Skilled Nursing Facility Living arrangements for the past 2 months: Single Family Home                   DME Agency: NA       HH Arranged: NA          Prior Living Arrangements/Services Living arrangements for the past 2 months: Single Family Home Lives with:: Friends Patient language and need for interpreter reviewed:: Yes Do you feel safe going back to the place where you live?: Yes      Need for Family Participation in Patient Care: No (Comment) Care giver support system in place?: No (comment) Current home services: DME Criminal Activity/Legal Involvement Pertinent to Current Situation/Hospitalization: No - Comment as needed  Activities of Daily Living   ADL Screening (condition at time of admission) Independently performs ADLs?: No Does the patient have a NEW difficulty with bathing/dressing/toileting/self-feeding that is expected to last >3 days?: No Does the patient have a NEW difficulty with getting in/out of bed, walking, or climbing stairs that is expected to last >3 days?: No Does the patient have a NEW difficulty with communication that is expected to last >3 days?: No Is the patient deaf or have difficulty hearing?: Yes Does the patient have difficulty seeing, even when wearing glasses/contacts?:  No Does the patient have difficulty concentrating, remembering, or making decisions?: No  Permission Sought/Granted                  Emotional Assessment Appearance:: Appears stated age     Orientation: : Oriented to Self, Oriented to Place, Oriented to  Time, Oriented to Situation   Psych Involvement: No (comment)  Admission diagnosis:  Wound dehiscence [T81.30XA] Patient Active Problem List   Diagnosis Date Noted   Generalized weakness 01/16/2024   Wound dehiscence 01/15/2024   Left leg pain 01/15/2024   Complication of foot amputation stump (HCC) 01/06/2024   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) 01/03/2024   Infection of toe as complication of amputation (HCC) 12/27/2023   Wound of left foot 12/13/2023   Open wound of left lower extremity 12/13/2023   Alcohol use disorder 12/13/2023   Pyogenic inflammation of bone (HCC) 12/13/2023   Hypomagnesemia 03/13/2023   Diarrhea 03/12/2023   Severe sepsis (HCC) 03/11/2023   ALC (alcoholic liver cirrhosis) (HCC) 03/11/2023   Chronic hepatitis C (HCC) 03/11/2023   Cellulitis of left lower extremity 03/11/2023   Ulcer of left foot with muscle involvement without evidence of necrosis (HCC) 03/11/2023   Nausea and vomiting 03/11/2023   Portal hypertension with esophageal varices (HCC) 11/27/2019   Thrombocytopenia, acquired (HCC) 11/27/2019   GERD (gastroesophageal reflux disease) 11/24/2017   Tachycardia 11/24/2017   Hypokalemia 10/25/2017   Thrombocytopenia (HCC)  10/25/2017   Advance care planning 10/25/2017   Depression, major, single episode, moderate (HCC) 10/25/2017   Elevated liver enzymes 09/28/2017   Hyperbilirubinemia    Alcohol use disorder, severe, dependence (HCC) 02/03/2015   Alcoholic hepatitis 01/30/2015   Hematochezia 01/30/2015   Coagulopathy (HCC) 01/30/2015   Hyponatremia 01/30/2015   Pancytopenia (HCC) 01/30/2015   PCP:  Vicente Graham, No Pharmacy:   [facility] [address] [place] [address] Phone: [phone] Fax: [phone]  [facility] [address] ([place]), [place] - [address] [place] ([place]) [place] [address] Phone: [phone] Fax: [phone]  [facility] [place], [place] - [address] [place] [address] Phone: [phone] Fax: [phone]     Social Drivers of Health (SDOH) Social History: SDOH Screenings   Food Insecurity: Food Insecurity Present (01/15/2024)  Housing: High Risk (01/15/2024)  Transportation Needs: Unmet Transportation Needs (01/15/2024)  Utilities: Not At Risk (01/15/2024)  Recent Concern: Utilities - At Risk (12/27/2023)  Financial Resource Strain: High Risk (03/22/2022)   Received from [facility], [facility]  Social Connections: Unknown (01/15/2024)  Tobacco Use: High Risk (01/15/2024)   SDOH Interventions:     Readmission Risk Interventions     No data to display

## 2024-01-17 NOTE — Progress Notes (Signed)
   01/17/24 1330  Spiritual Encounters  Type of Visit Initial  Care provided to: Patient  Conversation partners present during encounter Other (comment) Tax adviser)  Referral source Other (comment) (Unit Secretary)  OnCall Visit No   Chaplain visited with patient per staff suggestion while rounding.  Chaplain visited patient and offered a compassionate presence and reflective listening as patient shared emotions about his partial amputations.  Patient also shared regarding his family and his deceased parents, his support system and his faith tradition.  He believes in God but hasn't attended [employer] since his youth.  Rev. Rana M. Nolon Baxter, M.Div. Chaplain Resident [facility]

## 2024-01-18 DIAGNOSIS — T8130XA Disruption of wound, unspecified, initial encounter: Secondary | ICD-10-CM | POA: Diagnosis not present

## 2024-01-18 MED ORDER — LORAZEPAM 1 MG PO TABS
1.0000 mg | ORAL_TABLET | ORAL | Status: AC | PRN
  Administered 2024-01-18: 1 mg via ORAL
  Filled 2024-01-18: qty 1

## 2024-01-18 MED ORDER — ACETAMINOPHEN 500 MG PO TABS
500.0000 mg | ORAL_TABLET | Freq: Four times a day (QID) | ORAL | Status: DC
  Administered 2024-01-18 – 2024-01-23 (×14): 500 mg via ORAL
  Filled 2024-01-18 (×14): qty 1

## 2024-01-18 MED ORDER — LORAZEPAM 2 MG/ML IJ SOLN: 1.0000 mg | INTRAMUSCULAR | Status: AC | PRN

## 2024-01-18 NOTE — Progress Notes (Signed)
 PROGRESS NOTE    Derrick Blanchard  YQM:[id number] DOB: March 28, 1981 DOA: 01/15/2024 PCP: Pcp, No    Brief Narrative:  43 y.o. male with medical history significant of recent left transmet amputation.  Patient states that he has been having pain and he has left foot and leg since yesterday.  He states the morphine  is not helping him.  He ran out of his pain pills.  He has been taking doxycycline .  He stated that his foot wound opened up yesterday.  Came to the ER for further evaluation.  Case discussed with podiatry and he will need to be off his foot for a good period of time.  Will try conservative management with wound care at this point.    5/13.  Patient still having a lot of pain in his foot.  Hemoglobin drifted down to 7.8.  CRP less than 0.5 and ESR 21.  Continue oral antibiotics.  Seen by podiatry and recommended wound care and we will have to close by secondary intention.  Patient will be nonweightbearing on the left foot.  PT and OT evaluations.  Assessment & Plan:   Principal Problem:   Wound dehiscence Active Problems:   Left leg pain   ALC (alcoholic liver cirrhosis) (HCC)   Pancytopenia (HCC)   Alcohol use disorder   Generalized weakness  * Wound dehiscence CRP less than 0.5 and ESR 21.   Podiatry consulted No surgical indications.  Wound will need to close by secondary intention Per podiatry if wound does not close or patient is nonadherent to nonweightbearing restrictions he will likely need a proximal amputation Plan: Continue with multimodal pain control No indication for IV narcotic use Continue p.o. doxycycline  Continue therapy evaluations as tolerated Patient may benefit from placement in skilled nursing facility as next level of care   Left leg pain Ultrasound negative for DVT.  Continue with multimodal pain control   ALC (alcoholic liver cirrhosis) (HCC) Patient also has pancytopenia, coagulopathy, portal hypertension. Hemoglobin drifted down to 7. 7.   Currently indication for transfusion.  Patient saturating well on room air.  Continue to monitor hemoglobin.  Transfuse as needed less than 7   Alcohol use disorder No indication of acute withdrawal.  Educate patient.  CIWA protocol discontinued.   Generalized weakness Continue evaluations as tolerated.  Patient is interested in going to rehab.  Patient will benefit from skilled nursing facility placement as next level of care.      DVT prophylaxis: SCD Code Status: Full Family Communication: None Disposition Plan: Status is: Inpatient Remains inpatient appropriate because: Wound dehiscence, inadequate pain control   Level of care: Med-Surg  Consultants:  Podiatry  Procedures:  None  Antimicrobials: Doxycycline    Subjective: Seen and examined.  Resting in bed.  Continues to endorse pain in left foot.  Objective: Vitals:   01/17/24 2012 01/18/24 0211 01/18/24 0759 01/18/24 1020  BP: 109/65 116/87 104/61 117/80  Pulse: 76 76 84 79  Resp:   18   Temp: 98.1 F (36.7 C) 98.3 F (36.8 C) 98 F (36.7 C)   TempSrc: Oral     SpO2: 100% 99% 100%   Weight:      Height:        Intake/Output Summary (Last 24 hours) at 01/18/2024 1026 Last data filed at 01/18/2024 0700 Gross per 24 hour  Intake --  Output 550 ml  Net -550 ml   Filed Weights   01/15/24 1006  Weight: 98 kg    Examination:  General  exam: NAD Respiratory system: Lungs clear normal work of breathing.  Room air Cardiovascular system: S1-S2, RRR, no murmurs, no pedal edema Gastrointestinal system: Soft NT/ND, normal bowel sounds Central nervous system: Alert and oriented. No focal neurological deficits. Extremities: Status post bilateral TMA.  Left lower extremity in surgical dressing.  Dressing CDI. Skin: No rashes, lesions or ulcers Psychiatry: Judgement and insight appear normal. Mood & affect appropriate.     Data Reviewed: I have personally reviewed following labs and imaging  studies  CBC: Recent Labs  Lab 01/15/24 1049 01/16/24 0433 01/17/24 0536  WBC 3.0* 1.9* 1.7*  HGB 8.5* 7.8* 7.7*  HCT 28.1* 25.2* 24.4*  MCV 85.9 84.0 83.0  PLT 68* 49* 47*   Basic Metabolic Panel: Recent Labs  Lab 01/15/24 1049 01/16/24 0433  NA 145 140  K 4.2 4.1  CL 116* 112*  CO2 24 23  GLUCOSE 97 81  BUN 14 12  CREATININE 0.78 0.54*  CALCIUM  8.5* 8.1*   GFR: Estimated Creatinine Clearance: 138.2 mL/min (A) (by C-G formula based on SCr of 0.54 mg/dL (L)). Liver Function Tests: Recent Labs  Lab 01/15/24 1049  AST 31  ALT 17  ALKPHOS 79  BILITOT 1.3*  PROT 6.1*  ALBUMIN 2.9*   No results for input(s): "LIPASE", "AMYLASE" in the last 168 hours. Recent Labs  Lab 01/16/24 0433  AMMONIA 54*   Coagulation Profile: No results for input(s): "INR", "PROTIME" in the last 168 hours. Cardiac Enzymes: No results for input(s): "CKTOTAL", "CKMB", "CKMBINDEX", "TROPONINI" in the last 168 hours. BNP (last 3 results) No results for input(s): "PROBNP" in the last 8760 hours. HbA1C: No results for input(s): "HGBA1C" in the last 72 hours. CBG: Recent Labs  Lab 01/17/24 1649  GLUCAP 124*   Lipid Profile: No results for input(s): "CHOL", "HDL", "LDLCALC", "TRIG", "CHOLHDL", "LDLDIRECT" in the last 72 hours. Thyroid Function Tests: No results for input(s): "TSH", "T4TOTAL", "FREET4", "T3FREE", "THYROIDAB" in the last 72 hours. Anemia Panel: No results for input(s): "VITAMINB12", "FOLATE", "FERRITIN", "TIBC", "IRON", "RETICCTPCT" in the last 72 hours. Sepsis Labs: No results for input(s): "PROCALCITON", "LATICACIDVEN" in the last 168 hours.  No results found for this or any previous visit (from the past 240 hours).       Radiology Studies: No results found.       Scheduled Meds:  acetaminophen   1,000 mg Oral TID   doxycycline   100 mg Oral Q12H   enoxaparin (LOVENOX) injection  50 mg Subcutaneous Q24H   folic acid   1 mg Oral Daily   ketorolac   15 mg  Intravenous Q6H   lactulose   20 g Oral Daily   multivitamin with minerals  1 tablet Oral Daily   pantoprazole   40 mg Oral Daily   thiamine   100 mg Oral Daily   Or   thiamine   100 mg Intravenous Daily   Continuous Infusions:   LOS: 3 days     Tiajuana Fluke, MD [facility]   If 7PM-7AM, please contact night-coverage  01/18/2024, 10:26 AM

## 2024-01-18 NOTE — Plan of Care (Signed)

## 2024-01-18 NOTE — Plan of Care (Signed)

## 2024-01-18 NOTE — Progress Notes (Signed)
 Occupational Therapy Treatment Patient Details Name: Derrick Blanchard MRN: [id number] DOB: 06-Feb-1981 Today's Date: 01/18/2024   History of present illness COPE BENZON is a 42yoM PMH: Left TMA, cirrhosis 2/2 alcohol, active alcohol use disorder, thrombocytopenia, hypertension, depression, presented to the hospital with wound dehiscence. Pt returns to ED after extensive bleeding at surgical dressing, pt not adherent to NWB as instructed.   OT comments  Chart reviewed to date, pt greeted in room, reports feeling "shaky" on the inside but agreeable to OT tx session targeting improving functional activity in order to facilitate improved ADL performance. Pt continues to require MIN A fro STS, and amb in room approx 20' with RW with MIN A. Good adherence to NWBing throughout. HR 98 bpm after mobility, spo2 >95% on RA. RLE LB dressing completed with SET UP. Dicussed discharge recommendations and DME for safe ADL completion. Pt is left in bedside chair, all needs met. OT will continue to follow.      If plan is discharge home, recommend the following:  A little help with walking and/or transfers;A little help with bathing/dressing/bathroom;Assist for transportation;Help with stairs or ramp for entrance;Assistance with Charity fundraiser (measurements OT);BSC/3in1    Recommendations for Other Services      Precautions / Restrictions Precautions Precautions: Fall Recall of Precautions/Restrictions: Intact Restrictions Weight Bearing Restrictions Per Provider Order: Yes LLE Weight Bearing Per Provider Order: Non weight bearing       Mobility Bed Mobility Overal bed mobility: Modified Independent                  Transfers Overall transfer level: Needs assistance Equipment used: Rolling walker (2 wheels) Transfers: Sit to/from Stand Sit to Stand: Min assist (from various surfaces- chair, bed)                 Balance Overall  balance assessment: Needs assistance Sitting-balance support: Single extremity supported Sitting balance-Leahy Scale: Good     Standing balance support: Bilateral upper extremity supported, Reliant on assistive device for balance Standing balance-Leahy Scale: Fair                             ADL either performed or assessed with clinical judgement   ADL Overall ADL's : Needs assistance/impaired Eating/Feeding: Set up;Sitting                   Lower Body Dressing: Supervision/safety;Sitting/lateral leans Lower Body Dressing Details (indicate cue type and reason): donn R shoe Toilet Transfer: Minimal assistance;Rolling walker (2 wheels);Ambulation Toilet Transfer Details (indicate cue type and reason): simulated, good adherence to LLE NWBing         Functional mobility during ADLs: Minimal assistance;Cueing for safety;Rolling walker (2 wheels) (approx 20' in room with RW, good adherence to [employer])      Extremity/Trunk Assessment              Vision       Perception     Praxis     Communication Communication Communication: No apparent difficulties   Cognition Arousal: Alert Behavior During Therapy: WFL for tasks assessed/performed Cognition: No apparent impairments                               Following commands: Intact        Cueing   Cueing Techniques: Verbal cues  Exercises Other Exercises Other Exercises:  edu re: role of OT, role of rehab, discussed discharge recommendations    Shoulder Instructions       General Comments      Pertinent Vitals/ Pain       Pain Assessment Pain Assessment: 0-10 Pain Score: 6  Pain Location: L foot Pain Descriptors / Indicators: Discomfort, Grimacing Pain Intervention(s): Premedicated before session, Monitored during session, Repositioned  Home Living                                          Prior Functioning/Environment              Frequency  Min  2X/week        Progress Toward Goals  OT Goals(current goals can now be found in the care plan section)  Progress towards OT goals: Progressing toward goals  Acute Rehab OT Goals Time For Goal Achievement: 01/30/24  Plan      Co-evaluation                 AM-PAC OT "6 Clicks" Daily Activity     Outcome Measure   Help from another person eating meals?: None Help from another person taking care of personal grooming?: None Help from another person toileting, which includes using toliet, bedpan, or urinal?: A Little Help from another person bathing (including washing, rinsing, drying)?: None Help from another person to put on and taking off regular upper body clothing?: None Help from another person to put on and taking off regular lower body clothing?: None 6 Click Score: 23    End of Session Equipment Utilized During Treatment: Rolling walker (2 wheels)  OT Visit Diagnosis: Other abnormalities of gait and mobility (R26.89);Muscle weakness (generalized) (M62.81);Unsteadiness on feet (R26.81);Pain Pain - Right/Left: Left Pain - part of body: Ankle and joints of foot   Activity Tolerance Patient tolerated treatment well   Patient Left with call bell/phone within reach;in chair;with chair alarm set   Nurse Communication Mobility status        Time: 4098-1191 OT Time Calculation (min): 16 min  Charges: OT General Charges $OT Visit: 1 Visit OT Treatments $Therapeutic Activity: 8-22 mins  Gerre Kraft, OTD OTR/L  01/18/24, 11:10 AM

## 2024-01-19 DIAGNOSIS — T8130XA Disruption of wound, unspecified, initial encounter: Secondary | ICD-10-CM | POA: Diagnosis not present

## 2024-01-19 NOTE — Progress Notes (Signed)
 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 Pt returns to ED after extensive bleeding at surgical dressing, pt not adherent to NWB as instructed.   OT comment  Chart reviewed  to date, pt up in chair, agreeable to OT tx session targeting improving functional activity tolerance to improve ADL performance. Slight progress made, pt amb with RW in room with CGA-MIN A, seated grooming tasks with SET UP, toileting in sitting with set up. Dicussed discharge recommendations with pt. Pt is left as received, all needs met. Pt reports he is concerned about further injury to his foot if he discharges home alone. OT will continue to follow.       If plan is discharge home, recommend the following:  A little help with walking and/or transfers;A little help with bathing/dressing/bathroom;Assist for transportation;Help with stairs or ramp for entrance;Assistance with Charity fundraiser (measurements OT);BSC/3in1    Recommendations for Other Services      Precautions / Restrictions Precautions Precautions: Fall Recall of Precautions/Restrictions: Intact Restrictions Weight Bearing Restrictions Per Provider Order: Yes LLE Weight Bearing Per Provider Order: Non weight bearing       Mobility Bed Mobility               General bed mobility comments: NT in recliner pre/post session    Transfers Overall transfer level: Needs assistance Equipment used: Rolling walker (2 wheels) Transfers: Sit to/from Stand Sit to Stand: Contact guard assist, Min assist                 Balance Overall balance assessment: Needs assistance Sitting-balance support:  Single extremity supported Sitting balance-Leahy Scale: Normal     Standing balance support: Bilateral upper extremity supported, Reliant on assistive device for balance Standing balance-Leahy Scale: Fair                             ADL either performed or assessed with clinical judgement   ADL Overall ADL's : Needs assistance/impaired Eating/Feeding: Set up;Sitting   Grooming: Wash/dry face;Oral care;Sitting;Set up Grooming Details (indicate cue type and reason): on chair at sink level             Lower Body Dressing: Supervision/safety;Sitting/lateral leans Lower Body Dressing Details (indicate cue type and reason): donn R shoe Toilet Transfer: Contact guard assist;Minimal assistance;Rolling walker (2 wheels);Ambulation   Toileting- Clothing Manipulation and Hygiene: Supervision/safety;Sitting/lateral lean Toileting - Clothing Manipulation Details (indicate cue type and reason): use of urinal in sitting     Functional mobility during ADLs: Contact guard assist;Minimal assistance;Rolling walker (2 wheels) (approx 10' two attempts in room)      Extremity/Trunk Assessment              Vision       Perception     Praxis     Communication Communication Communication: No apparent difficulties   Cognition Arousal: Alert Behavior During Therapy: WFL for tasks assessed/performed Cognition: No apparent impairments                               Following commands: Intact        Cueing   Cueing Techniques: Verbal  cues  Exercises Other Exercises Other Exercises: edu re: role of OT, role of rehab, discussed discharge recommendations    Shoulder Instructions       General Comments good adherence to NWBing RLE    Pertinent Vitals/ Pain       Pain Assessment Pain Assessment: 0-10 Pain Score: 6  Pain Intervention(s): Monitored during session, Premedicated before session, Repositioned  Home Living                                           Prior Functioning/Environment              Frequency  Min 2X/week        Progress Toward Goals  OT Goals(current goals can now be found in the care plan section)  Progress towards OT goals: Progressing toward goals  Acute Rehab OT Goals Time For Goal Achievement: 01/30/24  Plan      Co-evaluation                 AM-PAC OT "6 Clicks" Daily Activity     Outcome Measure   Help from another person eating meals?: None Help from another person taking care of personal grooming?: None Help from another person toileting, which includes using toliet, bedpan, or urinal?: A Little Help from another person bathing (including washing, rinsing, drying)?: None Help from another person to put on and taking off regular upper body clothing?: None Help from another person to put on and taking off regular lower body clothing?: None 6 Click Score: 23    End of Session Equipment Utilized During Treatment: Rolling walker (2 wheels)  OT Visit Diagnosis: Other abnormalities of gait and mobility (R26.89);Muscle weakness (generalized) (M62.81);Unsteadiness on feet (R26.81);Pain   Activity Tolerance Patient tolerated treatment well   Patient Left with call bell/phone within reach;in chair;with chair alarm set   Nurse Communication          Time: 1610-9604 OT Time Calculation (min): 13 min  Charges: OT General Charges $OT Visit: 1 Visit OT Treatments $Self Care/Home Management : 8-22 mins  Gerre Kraft, OTD OTR/L  01/19/24, 12:49 PM

## 2024-01-19 NOTE — Plan of Care (Signed)
  Problem: Education: Goal: Knowledge of General Education information will improve Description: Including pain rating scale, medication(s)/side effects and non-pharmacologic comfort measures Outcome: Progressing   Problem: Clinical Measurements: Goal: Diagnostic test results will improve Outcome: Progressing   Problem: Activity: Goal: Risk for activity intolerance will decrease Outcome: Progressing   Problem: Nutrition: Goal: Adequate nutrition will be maintained Outcome: Progressing   Problem: Coping: Goal: Level of anxiety will decrease Outcome: Progressing   Problem: Elimination: Goal: Will not experience complications related to bowel motility Outcome: Progressing

## 2024-01-19 NOTE — Plan of Care (Signed)

## 2024-01-19 NOTE — Progress Notes (Signed)
 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 Pt returns to ED after extensive bleeding at surgical dressing, pt not adherent to NWB as instructed.    PT Comments  Pt sleeping on arrival but with some loud calling of his name woke and was eager to get up and work with PT.  Pt overall showing increased confidence and mobility and with "prolonged" in-room ambulation was able to maintain L NWBing and relatively safe effort with walker and multiple turns/obstacle avoidance.  Pt did have fatigue with the effort, but overall showed reports feeling well and that he is making gains.  Will benefit from ongoing PT, continue with POC.      If plan is discharge home, recommend the following: A little help with walking and/or transfers;A little help with bathing/dressing/bathroom;Help with stairs or ramp for entrance;Assist for transportation   Can travel by private vehicle     Yes  Equipment Recommendations  Wheelchair (measurements PT);BSC/3in1    Recommendations for Other Services       Precautions / Restrictions Precautions Precautions: Fall Recall of Precautions/Restrictions: Intact Restrictions LLE Weight Bearing Per Provider Order: Non weight bearing     Mobility  Bed Mobility Overal bed mobility: Modified Independent                  Transfers       Sit to Stand: Contact guard assist           General transfer comment: Definite need for UEs and weight shift, but able to rise w/o direct assist from bed and recliner this date    Ambulation/Gait Ambulation/Gait assistance: Contact guard assist, Min assist Gait Distance (Feet): 90 Feet Assistive device: Rolling walker (2  wheels) Gait Pattern/deviations: Step-to pattern       General Gait Details: hop to gait pattern, able to maintain NWBing status t/o, some fatigue, minimal unsteadiness able to manage tight turns in room relatively well   Stairs             Wheelchair Mobility     Tilt Bed    Modified Rankin (Stroke Patients Only)       Balance                                            Communication Communication Communication: No apparent difficulties  Cognition Arousal: Alert Behavior During Therapy: WFL for tasks assessed/performed                             Following commands: Intact      Cueing Cueing Techniques: Verbal cues  Exercises      General Comments General comments (skin integrity, edema, etc.): good adherence to NWBing RLE      Pertinent Vitals/Pain Pain Assessment Pain Score: 2  Pain Location: L foot, incision    Home Living                          Prior Function            PT Goals (current goals can now be found in  the care plan section) Progress towards PT goals: Progressing toward goals    Frequency    Min 2X/week      PT Plan      Co-evaluation              AM-PAC PT "6 Clicks" Mobility   Outcome Measure  Help needed turning from your back to your side while in a flat bed without using bedrails?: None Help needed moving from lying on your back to sitting on the side of a flat bed without using bedrails?: None Help needed moving to and from a bed to a chair (including a wheelchair)?: A Little Help needed standing up from a chair using your arms (e.g., wheelchair or bedside chair)?: A Little Help needed to walk in hospital room?: A Little Help needed climbing 3-5 steps with a railing? : A Lot 6 Click Score: 19    End of Session Equipment Utilized During Treatment: Gait belt Activity Tolerance: Patient limited by fatigue;Patient tolerated treatment well Patient left: in  chair;with chair alarm set;with call bell/phone within reach Nurse Communication: Mobility status PT Visit Diagnosis: Muscle weakness (generalized) (M62.81);Difficulty in walking, not elsewhere classified (R26.2);Pain Pain - Right/Left: Left Pain - part of body: Ankle and joints of foot     Time: 1123-1140 PT Time Calculation (min) (ACUTE ONLY): 17 min  Charges:    $Gait Training: 8-22 mins PT General Charges $$ ACUTE PT VISIT: 1 Visit                     Darice Edelman, DPT 01/19/2024, 1:00 PM

## 2024-01-19 NOTE — Progress Notes (Signed)
 PROGRESS NOTE    Derrick Blanchard  JYN:[id number] DOB: 04-Jul-1981 DOA: 01/15/2024 PCP: Pcp, No    Brief Narrative:   43 y.o. male with medical history significant of recent left transmet amputation.  Patient states that he has been having pain and he has left foot and leg since yesterday.  He states the morphine  is not helping him.  He ran out of his pain pills.  He has been taking doxycycline .  He stated that his foot wound opened up yesterday.  Came to the ER for further evaluation.  Case discussed with podiatry and he will need to be off his foot for a good period of time.  Will try conservative management with wound care at this point.    5/13.  Patient still having a lot of pain in his foot.  Hemoglobin drifted down to 7.8.  CRP less than 0.5 and ESR 21.  Continue oral antibiotics.  Seen by podiatry and recommended wound care and we will have to close by secondary intention.  Patient will be nonweightbearing on the left foot.  PT and OT evaluations.  Assessment & Plan:   Principal Problem:   Wound dehiscence Active Problems:   Left leg pain   ALC (alcoholic liver cirrhosis) (HCC)   Pancytopenia (HCC)   Alcohol use disorder   Generalized weakness  * Wound dehiscence CRP less than 0.5 and ESR 21.   Podiatry consulted No surgical indications.  Wound will need to close by secondary intention Per podiatry if wound does not close or patient is nonadherent to nonweightbearing restrictions he will likely need a proximal amputation Plan: Continue with multimodal pain control No IV narcotics Continue p.o. doxycycline  Continue therapy evaluations as tolerated Recommendation for skilled nursing facility as next level of care   Left leg pain Ultrasound negative for DVT.   Continue with multimodal pain control as above   ALC (alcoholic liver cirrhosis) (HCC) Patient also has pancytopenia, coagulopathy, portal hypertension. Hemoglobin drifted down to 7. 7.  Currently indication for  transfusion.  Patient saturating well on room air.  Continue to monitor hemoglobin.  Transfuse as needed hemoglobin less than 7   Alcohol use disorder Mild withdrawal noted on 5/15.  CIWA protocol reinitiated.   Generalized weakness Continue evaluations as tolerated.  Patient is interested in going to rehab.  Patient will benefit from skilled nursing facility placement as next level of care.  TOC aware and looking into options      DVT prophylaxis: SCD Code Status: Full Family Communication: None Disposition Plan: Status is: Inpatient Remains inpatient appropriate because: Wound dehiscence.  Inadequate pain control   Level of care: Med-Surg  Consultants:  Podiatry  Procedures:  None  Antimicrobials: Doxycycline    Subjective: Seen and examined.  Resting in bed.  Pain control appears to be stabilized.  Objective: Vitals:   01/18/24 1703 01/18/24 2044 01/19/24 0201 01/19/24 0822  BP: 104/71 117/61 115/74 111/69  Pulse: 85 83  94  Resp:  16 16 16   Temp:  97.6 F (36.4 C) 98.9 F (37.2 C) 98.4 F (36.9 C)  TempSrc:  Oral Oral   SpO2:  100% 94% 97%  Weight:      Height:        Intake/Output Summary (Last 24 hours) at 01/19/2024 1209 Last data filed at 01/19/2024 0900 Gross per 24 hour  Intake 0 ml  Output 2225 ml  Net -2225 ml   Filed Weights   01/15/24 1006  Weight: 98 kg  Examination:  General exam: No acute distress Respiratory system: Lungs clear normal work of breathing.  Room air Cardiovascular system: S1-S2, RRR, no murmurs, no pedal edema Gastrointestinal system: Soft NT/ND, normal bowel sounds Central nervous system: Alert and oriented. No focal neurological deficits. Extremities: Status post bilateral TMA.  Left lower extremity in surgical dressing.  Dressing CDI.  No pain on palpation Skin: No rashes, lesions or ulcers Psychiatry: Judgement and insight appear normal. Mood & affect appropriate.     Data Reviewed: I have personally reviewed  following labs and imaging studies  CBC: Recent Labs  Lab 01/15/24 1049 01/16/24 0433 01/17/24 0536  WBC 3.0* 1.9* 1.7*  HGB 8.5* 7.8* 7.7*  HCT 28.1* 25.2* 24.4*  MCV 85.9 84.0 83.0  PLT 68* 49* 47*   Basic Metabolic Panel: Recent Labs  Lab 01/15/24 1049 01/16/24 0433  NA 145 140  K 4.2 4.1  CL 116* 112*  CO2 24 23  GLUCOSE 97 81  BUN 14 12  CREATININE 0.78 0.54*  CALCIUM  8.5* 8.1*   GFR: Estimated Creatinine Clearance: 138.2 mL/min (A) (by C-G formula based on SCr of 0.54 mg/dL (L)). Liver Function Tests: Recent Labs  Lab 01/15/24 1049  AST 31  ALT 17  ALKPHOS 79  BILITOT 1.3*  PROT 6.1*  ALBUMIN 2.9*   No results for input(s): "LIPASE", "AMYLASE" in the last 168 hours. Recent Labs  Lab 01/16/24 0433  AMMONIA 54*   Coagulation Profile: No results for input(s): "INR", "PROTIME" in the last 168 hours. Cardiac Enzymes: No results for input(s): "CKTOTAL", "CKMB", "CKMBINDEX", "TROPONINI" in the last 168 hours. BNP (last 3 results) No results for input(s): "PROBNP" in the last 8760 hours. HbA1C: No results for input(s): "HGBA1C" in the last 72 hours. CBG: Recent Labs  Lab 01/17/24 1649  GLUCAP 124*   Lipid Profile: No results for input(s): "CHOL", "HDL", "LDLCALC", "TRIG", "CHOLHDL", "LDLDIRECT" in the last 72 hours. Thyroid Function Tests: No results for input(s): "TSH", "T4TOTAL", "FREET4", "T3FREE", "THYROIDAB" in the last 72 hours. Anemia Panel: No results for input(s): "VITAMINB12", "FOLATE", "FERRITIN", "TIBC", "IRON", "RETICCTPCT" in the last 72 hours. Sepsis Labs: No results for input(s): "PROCALCITON", "LATICACIDVEN" in the last 168 hours.  No results found for this or any previous visit (from the past 240 hours).       Radiology Studies: No results found.       Scheduled Meds:  acetaminophen   500 mg Oral Q6H   doxycycline   100 mg Oral Q12H   enoxaparin (LOVENOX) injection  50 mg Subcutaneous Q24H   folic acid   1 mg Oral  Daily   lactulose   20 g Oral Daily   multivitamin with minerals  1 tablet Oral Daily   pantoprazole   40 mg Oral Daily   thiamine   100 mg Oral Daily   Or   thiamine   100 mg Intravenous Daily   Continuous Infusions:   LOS: 4 days     Tiajuana Fluke, MD [facility]   If 7PM-7AM, please contact night-coverage  01/19/2024, 12:09 PM

## 2024-01-19 NOTE — TOC Progression Note (Signed)
 Transition of Care [facility]) - Progression Note    Patient Details  Name: Derrick Blanchard MRN: [id number] Date of Birth: [birth year]  [facility] CM/SW Contact  Alexandra Ice, RN Phone Number: 01/19/2024, 3:39 PM  Clinical Narrative:    Contacted patient discussed bed offers, he stated he needs a little more time. TOC will need to follow up with patient tomorrow on patient's choice.    Expected Discharge Plan: Skilled Nursing Facility Barriers to Discharge: Continued Medical Work up  Expected Discharge Plan and Services   Discharge Planning Services: CM Consult Post Acute Care Choice: Skilled Nursing Facility Living arrangements for the past 2 months: Single Family Home                   DME Agency: NA       HH Arranged: NA           Social Determinants of Health (SDOH) Interventions SDOH Screenings   Food Insecurity: Food Insecurity Present (01/15/2024)  Housing: High Risk (01/15/2024)  Transportation Needs: Unmet Transportation Needs (01/15/2024)  Utilities: Not At Risk (01/15/2024)  Recent Concern: Utilities - At Risk (12/27/2023)  Financial Resource Strain: High Risk (03/22/2022)   Received [facility], [facility]  Social Connections: Unknown (01/15/2024)  Tobacco Use: High Risk (01/15/2024)    Readmission Risk Interventions     No data to display

## 2024-01-20 DIAGNOSIS — T8130XA Disruption of wound, unspecified, initial encounter: Secondary | ICD-10-CM | POA: Diagnosis not present

## 2024-01-20 LAB — CBC WITH DIFFERENTIAL/PLATELET
Abs Immature Granulocytes: 0.01 10*3/uL (ref 0.00–0.07)
Basophils Absolute: 0 10*3/uL (ref 0.0–0.1)
Basophils Relative: 0 %
Eosinophils Absolute: 0.1 10*3/uL (ref 0.0–0.5)
Eosinophils Relative: 2 %
HCT: 24.1 % — ABNORMAL LOW (ref 39.0–52.0)
Hemoglobin: 7.5 g/dL — ABNORMAL LOW (ref 13.0–17.0)
Immature Granulocytes: 0 %
Lymphocytes Relative: 17 %
Lymphs Abs: 0.4 10*3/uL — ABNORMAL LOW (ref 0.7–4.0)
MCH: 25.7 pg — ABNORMAL LOW (ref 26.0–34.0)
MCHC: 31.1 g/dL (ref 30.0–36.0)
MCV: 82.5 fL (ref 80.0–100.0)
Monocytes Absolute: 0.4 10*3/uL (ref 0.1–1.0)
Monocytes Relative: 16 %
Neutro Abs: 1.5 10*3/uL — ABNORMAL LOW (ref 1.7–7.7)
Neutrophils Relative %: 65 %
Platelets: 60 10*3/uL — ABNORMAL LOW (ref 150–400)
RBC: 2.92 MIL/uL — ABNORMAL LOW (ref 4.22–5.81)
RDW: 20.4 % — ABNORMAL HIGH (ref 11.5–15.5)
WBC: 2.3 10*3/uL — ABNORMAL LOW (ref 4.0–10.5)
nRBC: 0 % (ref 0.0–0.2)

## 2024-01-20 LAB — BASIC METABOLIC PANEL WITH GFR
Anion gap: 7 (ref 5–15)
BUN: 6 mg/dL (ref 6–20)
CO2: 22 mmol/L (ref 22–32)
Calcium: 8 mg/dL — ABNORMAL LOW (ref 8.9–10.3)
Chloride: 106 mmol/L (ref 98–111)
Creatinine, Ser: 0.55 mg/dL — ABNORMAL LOW (ref 0.61–1.24)
GFR, Estimated: >60 mL/min (ref >60)
Glucose, Bld: 102 mg/dL — ABNORMAL HIGH (ref 70–99)
Potassium: 3.8 mmol/L (ref 3.5–5.1)
Sodium: 135 mmol/L (ref 135–145)

## 2024-01-20 NOTE — Progress Notes (Signed)
 Photos taken today of left foot wound.   Dressing changed per wound care orders.  Pt tolerated fair.  Pt was premedicated prior to dressing change.

## 2024-01-20 NOTE — Progress Notes (Signed)
 PROGRESS NOTE    Derrick Blanchard  GNF:[id number] DOB: 03-Nov-1980 DOA: 01/15/2024 PCP: Pcp, No    Brief Narrative:   43 y.o. male with medical history significant of recent left transmet amputation.  Patient states that he has been having pain and he has left foot and leg since yesterday.  He states the morphine  is not helping him.  He ran out of his pain pills.  He has been taking doxycycline .  He stated that his foot wound opened up yesterday.  Came to the ER for further evaluation.  Case discussed with podiatry and he will need to be off his foot for a good period of time.  Will try conservative management with wound care at this point.    5/13.  Patient still having a lot of pain in his foot.  Hemoglobin drifted down to 7.8.  CRP less than 0.5 and ESR 21.  Continue oral antibiotics.  Seen by podiatry and recommended wound care and we will have to close by secondary intention.  Patient will be nonweightbearing on the left foot.  PT and OT evaluations.  Assessment & Plan:   Principal Problem:   Wound dehiscence Active Problems:   Left leg pain   ALC (alcoholic liver cirrhosis) (HCC)   Pancytopenia (HCC)   Alcohol use disorder   Generalized weakness  * Wound dehiscence CRP less than 0.5 and ESR 21.   Podiatry consulted No surgical indications.  Wound will need to close by secondary intention Per podiatry if wound does not close or patient is nonadherent to nonweightbearing restrictions he will likely need a proximal amputation Plan: Continue multimodal pain control.  No IV narcotics.  Continue p.o. doxycycline .  Continue therapy evaluations as tolerated.  Recommendation for skilled nursing facility as next all of care.  TOC to follow-up with patient to obtain choices.   Left leg pain Ultrasound negative for DVT.   Continue with multimodal pain control as above   ALC (alcoholic liver cirrhosis) (HCC) Patient also has pancytopenia, coagulopathy, portal hypertension. Hemoglobin  drifted down to 7.5.  Currently no indication for transfusion.  Saturating well on room air.  Monitor hemoglobin.  Transfuse as needed less than 7.  Iron indices significant for anemia of chronic inflammation.   Alcohol use disorder Mild withdrawal noted on 5/15.  CIWA protocol reinitiated.  Has been improving   Generalized weakness Continue evaluations as tolerated.  Patient is interested in going to rehab.  Patient will benefit from skilled nursing facility placement as next level of care.  TOC aware and looking into options      DVT prophylaxis: SCD Code Status: Full Family Communication: None Disposition Plan: Status is: Inpatient Remains inpatient appropriate because: Wound dehiscence.  Inadequate pain control   Level of care: Med-Surg  Consultants:  Podiatry  Procedures:  None  Antimicrobials: Doxycycline    Subjective: Seen and examined.  Resting in bed.  Pain control adequate  Objective: Vitals:   01/19/24 1953 01/19/24 2313 01/20/24 0331 01/20/24 0738  BP: 132/70 113/78 122/83 115/66  Pulse: 85 96 78 82  Resp: 18 17 18 16   Temp: 98.4 F (36.9 C) 98.2 F (36.8 C) 98.2 F (36.8 C) 97.9 F (36.6 C)  TempSrc:    Oral  SpO2: 100% 100% 100% 100%  Weight:      Height:        Intake/Output Summary (Last 24 hours) at 01/20/2024 1358 Last data filed at 01/20/2024 0000 Gross per 24 hour  Intake 360 ml  Output --  Net 360 ml   Filed Weights   01/15/24 1006  Weight: 98 kg    Examination:  General exam: NAD Respiratory system: Lungs clear.  Normal work of breathing.  Room air Cardiovascular system: S1-S2, RRR, no murmurs, no pedal edema Gastrointestinal system: Soft NT/ND, normal bowel sounds Central nervous system: Alert and oriented. No focal neurological deficits. Extremities: Status post bilateral TMA.  Left lower extremity in surgical dressing.  Dressing CDI.  No pain on palpation Skin: No rashes, lesions or ulcers Psychiatry: Judgement and insight  appear normal. Mood & affect appropriate.     Data Reviewed: I have personally reviewed following labs and imaging studies  CBC: Recent Labs  Lab 01/15/24 1049 01/16/24 0433 01/17/24 0536 01/20/24 0832  WBC 3.0* 1.9* 1.7* 2.3*  NEUTROABS  --   --   --  1.5*  HGB 8.5* 7.8* 7.7* 7.5*  HCT 28.1* 25.2* 24.4* 24.1*  MCV 85.9 84.0 83.0 82.5  PLT 68* 49* 47* 60*   Basic Metabolic Panel: Recent Labs  Lab 01/15/24 1049 01/16/24 0433 01/20/24 0832  NA 145 140 135  K 4.2 4.1 3.8  CL 116* 112* 106  CO2 24 23 22   GLUCOSE 97 81 102*  BUN 14 12 6   CREATININE 0.78 0.54* 0.55*  CALCIUM  8.5* 8.1* 8.0*   GFR: Estimated Creatinine Clearance: 138.2 mL/min (A) (by C-G formula based on SCr of 0.55 mg/dL (L)). Liver Function Tests: Recent Labs  Lab 01/15/24 1049  AST 31  ALT 17  ALKPHOS 79  BILITOT 1.3*  PROT 6.1*  ALBUMIN 2.9*   No results for input(s): "LIPASE", "AMYLASE" in the last 168 hours. Recent Labs  Lab 01/16/24 0433  AMMONIA 54*   Coagulation Profile: No results for input(s): "INR", "PROTIME" in the last 168 hours. Cardiac Enzymes: No results for input(s): "CKTOTAL", "CKMB", "CKMBINDEX", "TROPONINI" in the last 168 hours. BNP (last 3 results) No results for input(s): "PROBNP" in the last 8760 hours. HbA1C: No results for input(s): "HGBA1C" in the last 72 hours. CBG: Recent Labs  Lab 01/17/24 1649  GLUCAP 124*   Lipid Profile: No results for input(s): "CHOL", "HDL", "LDLCALC", "TRIG", "CHOLHDL", "LDLDIRECT" in the last 72 hours. Thyroid Function Tests: No results for input(s): "TSH", "T4TOTAL", "FREET4", "T3FREE", "THYROIDAB" in the last 72 hours. Anemia Panel: No results for input(s): "VITAMINB12", "FOLATE", "FERRITIN", "TIBC", "IRON", "RETICCTPCT" in the last 72 hours. Sepsis Labs: No results for input(s): "PROCALCITON", "LATICACIDVEN" in the last 168 hours.  No results found for this or any previous visit (from the past 240 hours).        Radiology Studies: No results found.       Scheduled Meds:  acetaminophen   500 mg Oral Q6H   doxycycline   100 mg Oral Q12H   enoxaparin  (LOVENOX ) injection  50 mg Subcutaneous Q24H   folic acid   1 mg Oral Daily   lactulose   20 g Oral Daily   multivitamin with minerals  1 tablet Oral Daily   pantoprazole   40 mg Oral Daily   thiamine   100 mg Oral Daily   Or   thiamine   100 mg Intravenous Daily   Continuous Infusions:   LOS: 5 days     Tiajuana Fluke, MD [facility]   If 7PM-7AM, please contact night-coverage  01/20/2024, 1:58 PM

## 2024-01-20 NOTE — Plan of Care (Signed)
  Problem: Education: Goal: Knowledge of General Education information will improve Description: Including pain rating scale, medication(s)/side effects and non-pharmacologic comfort measures Outcome: Progressing   Problem: Health Behavior/Discharge Planning: Goal: Ability to manage health-related needs will improve Outcome: Adequate for Discharge   Problem: Clinical Measurements: Goal: Will remain free from infection Outcome: Not Progressing   Problem: Skin Integrity: Goal: Risk for impaired skin integrity will decrease Outcome: Not Progressing

## 2024-01-21 DIAGNOSIS — T8130XA Disruption of wound, unspecified, initial encounter: Secondary | ICD-10-CM | POA: Diagnosis not present

## 2024-01-21 NOTE — TOC Progression Note (Signed)
 Transition of Care [facility]) - Progression Note    Patient Details  Name: Derrick Blanchard MRN: [id number] Date of Birth: 1980/09/24  Transition of Care [facility]) CM/SW Contact  Crayton Docker, RN Phone Number: 01/21/2024, 2:45 PM  Clinical Narrative:     Noted, 2 accepting SNF orders from [facility], [place] and [facility], [place], [place]. CM follow up call placed to patient regarding SNF preferences. Patient prefers [facility], [place], [place]. CM follow up call placed to Admissions, [facility], phone: [phone] regarding possible coordination of transfer to SNF. No answer, CM left message for  Admissions to return CM call.   Expected Discharge Plan: Skilled Nursing Facility Barriers to Discharge: Continued Medical Work up  Expected Discharge Plan and Services   Discharge Planning Services: CM Consult Post Acute Care Choice: Skilled Nursing Facility Living arrangements for the past 2 months: Single Family Home                   DME Agency: NA    HH Arranged: NA  Social Determinants of Health (SDOH) Interventions SDOH Screenings   Food Insecurity: Food Insecurity Present (01/15/2024)  Housing: High Risk (01/15/2024)  Transportation Needs: Unmet Transportation Needs (01/15/2024)  Utilities: Not At Risk (01/15/2024)  Recent Concern: Utilities - At Risk (12/27/2023)  Financial Resource Strain: High Risk (03/22/2022)   Received from [facility], [facility]  Social Connections: Unknown (01/15/2024)  Tobacco Use: High Risk (01/15/2024)    Readmission Risk Interventions     No data to display

## 2024-01-21 NOTE — Progress Notes (Signed)
 PROGRESS NOTE    Derrick Blanchard  GEX:[id number] DOB: 22-Sep-1980 DOA: 01/15/2024 PCP: Pcp, No    Brief Narrative:   43 y.o. male with medical history significant of recent left transmet amputation.  Patient states that he has been having pain and he has left foot and leg since yesterday.  He states the morphine  is not helping him.  He ran out of his pain pills.  He has been taking doxycycline .  He stated that his foot wound opened up yesterday.  Came to the ER for further evaluation.  Case discussed with podiatry and he will need to be off his foot for a good period of time.  Will try conservative management with wound care at this point.    5/13.  Patient still having a lot of pain in his foot.  Hemoglobin drifted down to 7.8.  CRP less than 0.5 and ESR 21.  Continue oral antibiotics.  Seen by podiatry and recommended wound care and we will have to close by secondary intention.  Patient will be nonweightbearing on the left foot.  PT and OT evaluations.  Assessment & Plan:   Principal Problem:   Wound dehiscence Active Problems:   Left leg pain   ALC (alcoholic liver cirrhosis) (HCC)   Pancytopenia (HCC)   Alcohol use disorder   Generalized weakness  * Wound dehiscence CRP less than 0.5 and ESR 21.   Podiatry consulted No surgical indications.  Wound will need to close by secondary intention Per podiatry if wound does not close or patient is nonadherent to nonweightbearing restrictions he will likely need a proximal amputation Plan: Continue multimodal pain control.  No IV narcotics.  Continue p.o. doxycycline .  Continue therapy evaluations as tolerated.  Recommendation for skilled nursing facility as next all of care.  TOC to follow-up with patient to obtain choices.  Dressing changes per nursing   Left leg pain Ultrasound negative for DVT.   Continue with multimodal pain control as above   ALC (alcoholic liver cirrhosis) (HCC) Patient also has pancytopenia, coagulopathy,  portal hypertension. Hemoglobin drifted down to 7.5.  Currently no indication for transfusion.  Saturating well on room air.  Monitor hemoglobin.  Transfuse as needed less than 7.  Iron indices significant for anemia of chronic inflammation.   Alcohol use disorder Mild withdrawal noted on 5/15.  No evidence of withdrawal since.  Can likely discontinue CIWA protocol in 24 hours   Generalized weakness Continue evaluations as tolerated.  Patient is interested in going to rehab.  Patient will benefit from skilled nursing facility placement as next level of care.  Needs SNF choices.  TOC contacted.      DVT prophylaxis: SCD Code Status: Full Family Communication: None Disposition Plan: Status is: Inpatient Remains inpatient appropriate because: Wound dehiscence.  Inadequate pain control   Level of care: Med-Surg  Consultants:  Podiatry  Procedures:  None  Antimicrobials: Doxycycline    Subjective: Seen and examined.  Resting in bed.  Endorses some pain in left foot stump  Objective: Vitals:   01/20/24 1536 01/20/24 2035 01/21/24 0535 01/21/24 0740  BP: 101/61 109/66 115/68 102/60  Pulse: 76 77 91 91  Resp:  18 18 16   Temp: 98 F (36.7 C) 98.2 F (36.8 C) 98.4 F (36.9 C) 98.5 F (36.9 C)  TempSrc: Oral  Oral Oral  SpO2: 100% 98% 98% 99%  Weight:      Height:        Intake/Output Summary (Last 24 hours) at 01/21/2024 1028  Last data filed at 01/21/2024 0611 Gross per 24 hour  Intake 340 ml  Output 2700 ml  Net -2360 ml   Filed Weights   01/15/24 1006  Weight: 98 kg    Examination:  General exam: No acute distress Respiratory system: Lungs clear.  Normal work of breathing.  Room air Cardiovascular system: S1-S2, RRR, no murmurs, no pedal edema Gastrointestinal system: Soft NT/ND, normal bowel sounds Central nervous system: Alert and oriented. No focal neurological deficits. Extremities: Status post bilateral TMA.  Left lower extremity in surgical dressing.   Dressing CDI.  No pain on palpation Skin: No rashes, lesions or ulcers Psychiatry: Judgement and insight appear normal. Mood & affect appropriate.     Data Reviewed: I have personally reviewed following labs and imaging studies  CBC: Recent Labs  Lab 01/15/24 1049 01/16/24 0433 01/17/24 0536 01/20/24 0832  WBC 3.0* 1.9* 1.7* 2.3*  NEUTROABS  --   --   --  1.5*  HGB 8.5* 7.8* 7.7* 7.5*  HCT 28.1* 25.2* 24.4* 24.1*  MCV 85.9 84.0 83.0 82.5  PLT 68* 49* 47* 60*   Basic Metabolic Panel: Recent Labs  Lab 01/15/24 1049 01/16/24 0433 01/20/24 0832  NA 145 140 135  K 4.2 4.1 3.8  CL 116* 112* 106  CO2 24 23 22   GLUCOSE 97 81 102*  BUN 14 12 6   CREATININE 0.78 0.54* 0.55*  CALCIUM  8.5* 8.1* 8.0*   GFR: Estimated Creatinine Clearance: 138.2 mL/min (A) (by C-G formula based on SCr of 0.55 mg/dL (L)). Liver Function Tests: Recent Labs  Lab 01/15/24 1049  AST 31  ALT 17  ALKPHOS 79  BILITOT 1.3*  PROT 6.1*  ALBUMIN 2.9*   No results for input(s): "LIPASE", "AMYLASE" in the last 168 hours. Recent Labs  Lab 01/16/24 0433  AMMONIA 54*   Coagulation Profile: No results for input(s): "INR", "PROTIME" in the last 168 hours. Cardiac Enzymes: No results for input(s): "CKTOTAL", "CKMB", "CKMBINDEX", "TROPONINI" in the last 168 hours. BNP (last 3 results) No results for input(s): "PROBNP" in the last 8760 hours. HbA1C: No results for input(s): "HGBA1C" in the last 72 hours. CBG: Recent Labs  Lab 01/17/24 1649  GLUCAP 124*   Lipid Profile: No results for input(s): "CHOL", "HDL", "LDLCALC", "TRIG", "CHOLHDL", "LDLDIRECT" in the last 72 hours. Thyroid Function Tests: No results for input(s): "TSH", "T4TOTAL", "FREET4", "T3FREE", "THYROIDAB" in the last 72 hours. Anemia Panel: No results for input(s): "VITAMINB12", "FOLATE", "FERRITIN", "TIBC", "IRON", "RETICCTPCT" in the last 72 hours. Sepsis Labs: No results for input(s): "PROCALCITON", "LATICACIDVEN" in the last  168 hours.  No results found for this or any previous visit (from the past 240 hours).       Radiology Studies: No results found.       Scheduled Meds:  acetaminophen   500 mg Oral Q6H   doxycycline   100 mg Oral Q12H   enoxaparin  (LOVENOX ) injection  50 mg Subcutaneous Q24H   folic acid   1 mg Oral Daily   lactulose   20 g Oral Daily   multivitamin with minerals  1 tablet Oral Daily   pantoprazole   40 mg Oral Daily   thiamine   100 mg Oral Daily   Or   thiamine   100 mg Intravenous Daily   Continuous Infusions:   LOS: 6 days     Tiajuana Fluke, MD [facility]   If 7PM-7AM, please contact night-coverage  01/21/2024, 10:28 AM

## 2024-01-22 DIAGNOSIS — T8130XA Disruption of wound, unspecified, initial encounter: Secondary | ICD-10-CM | POA: Diagnosis not present

## 2024-01-22 LAB — CREATININE, SERUM
Creatinine, Ser: 0.57 mg/dL — ABNORMAL LOW (ref 0.61–1.24)
GFR, Estimated: >60 mL/min (ref >60)

## 2024-01-22 NOTE — Plan of Care (Signed)

## 2024-01-22 NOTE — Progress Notes (Signed)
 Per Dr. Mosetta Areola, RN to take some pictures when next dressing change is done tomorrow and to notify him.

## 2024-01-22 NOTE — Progress Notes (Signed)
 PROGRESS NOTE    Derrick Blanchard  ZOX:[id number] DOB: January 02, 1981 DOA: 01/15/2024 PCP: Pcp, No    Brief Narrative:   43 y.o. male with medical history significant of recent left transmet amputation.  Patient states that he has been having pain and he has left foot and leg since yesterday.  He states the morphine  is not helping him.  He ran out of his pain pills.  He has been taking doxycycline .  He stated that his foot wound opened up yesterday.  Came to the ER for further evaluation.  Case discussed with podiatry and he will need to be off his foot for a good period of time.  Will try conservative management with wound care at this point.    5/13.  Patient still having a lot of pain in his foot.  Hemoglobin drifted down to 7.8.  CRP less than 0.5 and ESR 21.  Continue oral antibiotics.  Seen by podiatry and recommended wound care and we will have to close by secondary intention.  Patient will be nonweightbearing on the left foot.  PT and OT evaluations.  Assessment & Plan:   Principal Problem:   Wound dehiscence Active Problems:   Left leg pain   ALC (alcoholic liver cirrhosis) (HCC)   Pancytopenia (HCC)   Alcohol use disorder   Generalized weakness  * Wound dehiscence CRP less than 0.5 and ESR 21.   Podiatry consulted No surgical indications.  Wound will need to close by secondary intention Per podiatry if wound does not close or patient is nonadherent to nonweightbearing restrictions he will likely need a proximal amputation Plan: Continue multimodal pain control.  No IV narcotics.  Continue p.o. doxycycline .  Continue therapy evaluations as tolerated.  Recommendation for skilled nursing facility as next all of care.  TOC to follow-up with patient to obtain choices.  Dressing changes per nursing.  MD will evaluate wound on 5/20   Left leg pain Ultrasound negative for DVT.   Continue with multimodal pain control as above   ALC (alcoholic liver cirrhosis) (HCC) Patient also  has pancytopenia, coagulopathy, portal hypertension. Hemoglobin drifted down to 7.5.  Currently no indication for transfusion.  Saturating well on room air.  Monitor hemoglobin.  Transfuse as needed less than 7.  Iron indices significant for anemia of chronic inflammation.  No indication for blood or iron transfusion   Alcohol use disorder Mild withdrawal noted on 5/15.  No evidence of withdrawal since.  Discontinue CIWA   Generalized weakness Continue evaluations as tolerated.  Patient is interested in going to rehab.  Patient will benefit from skilled nursing facility placement as next level of care.  Needs SNF choices.  TOC contacted.      DVT prophylaxis: SCD Code Status: Full Family Communication: None Disposition Plan: Status is: Inpatient Remains inpatient appropriate because: Wound dehiscence.  Inadequate pain control   Level of care: Med-Surg  Consultants:  Podiatry  Procedures:  None  Antimicrobials: Doxycycline    Subjective: Seen and examined.  Sitting up in bed.  Continues to endorse pain in left foot stump  Objective: Vitals:   01/21/24 1617 01/21/24 2102 01/22/24 0440 01/22/24 0918  BP: 115/72 109/66 116/66 116/69  Pulse: 87 82 91 88  Resp: 16 18 16 16   Temp: 98.2 F (36.8 C) 98 F (36.7 C) 98.3 F (36.8 C) 99.1 F (37.3 C)  TempSrc: Oral Oral  Oral  SpO2: 98% 100% 98% 99%  Weight:      Height:  Intake/Output Summary (Last 24 hours) at 01/22/2024 1034 Last data filed at 01/22/2024 0900 Gross per 24 hour  Intake 340 ml  Output 3600 ml  Net -3260 ml   Filed Weights   01/15/24 1006  Weight: 98 kg    Examination:  General exam: NAD Respiratory system: Lungs clear.  Normal work of breathing.  Room air Cardiovascular system: S1-S2, RRR, no murmurs, no pedal edema Gastrointestinal system: Soft NT/ND, normal bowel sounds Central nervous system: Alert and oriented. No focal neurological deficits. Extremities: Status post bilateral TMA.   Left lower extremity in surgical dressing.  Dressing CDI.  No pain on palpation Skin: No rashes, lesions or ulcers Psychiatry: Judgement and insight appear normal. Mood & affect appropriate.     Data Reviewed: I have personally reviewed following labs and imaging studies  CBC: Recent Labs  Lab 01/15/24 1049 01/16/24 0433 01/17/24 0536 01/20/24 0832  WBC 3.0* 1.9* 1.7* 2.3*  NEUTROABS  --   --   --  1.5*  HGB 8.5* 7.8* 7.7* 7.5*  HCT 28.1* 25.2* 24.4* 24.1*  MCV 85.9 84.0 83.0 82.5  PLT 68* 49* 47* 60*   Basic Metabolic Panel: Recent Labs  Lab 01/15/24 1049 01/16/24 0433 01/20/24 0832 01/22/24 0534  NA 145 140 135  --   K 4.2 4.1 3.8  --   CL 116* 112* 106  --   CO2 24 23 22   --   GLUCOSE 97 81 102*  --   BUN 14 12 6   --   CREATININE 0.78 0.54* 0.55* 0.57*  CALCIUM  8.5* 8.1* 8.0*  --    GFR: Estimated Creatinine Clearance: 138.2 mL/min (A) (by C-G formula based on SCr of 0.57 mg/dL (L)). Liver Function Tests: Recent Labs  Lab 01/15/24 1049  AST 31  ALT 17  ALKPHOS 79  BILITOT 1.3*  PROT 6.1*  ALBUMIN 2.9*   No results for input(s): "LIPASE", "AMYLASE" in the last 168 hours. Recent Labs  Lab 01/16/24 0433  AMMONIA 54*   Coagulation Profile: No results for input(s): "INR", "PROTIME" in the last 168 hours. Cardiac Enzymes: No results for input(s): "CKTOTAL", "CKMB", "CKMBINDEX", "TROPONINI" in the last 168 hours. BNP (last 3 results) No results for input(s): "PROBNP" in the last 8760 hours. HbA1C: No results for input(s): "HGBA1C" in the last 72 hours. CBG: Recent Labs  Lab 01/17/24 1649  GLUCAP 124*   Lipid Profile: No results for input(s): "CHOL", "HDL", "LDLCALC", "TRIG", "CHOLHDL", "LDLDIRECT" in the last 72 hours. Thyroid Function Tests: No results for input(s): "TSH", "T4TOTAL", "FREET4", "T3FREE", "THYROIDAB" in the last 72 hours. Anemia Panel: No results for input(s): "VITAMINB12", "FOLATE", "FERRITIN", "TIBC", "IRON", "RETICCTPCT" in  the last 72 hours. Sepsis Labs: No results for input(s): "PROCALCITON", "LATICACIDVEN" in the last 168 hours.  No results found for this or any previous visit (from the past 240 hours).       Radiology Studies: No results found.       Scheduled Meds:  acetaminophen   500 mg Oral Q6H   doxycycline   100 mg Oral Q12H   enoxaparin  (LOVENOX ) injection  50 mg Subcutaneous Q24H   folic acid   1 mg Oral Daily   lactulose   20 g Oral Daily   multivitamin with minerals  1 tablet Oral Daily   pantoprazole   40 mg Oral Daily   thiamine   100 mg Oral Daily   Or   thiamine   100 mg Intravenous Daily   Continuous Infusions:   LOS: 7 days  Tiajuana Fluke, MD [facility]   If 7PM-7AM, please contact night-coverage  01/22/2024, 10:34 AM

## 2024-01-22 NOTE — TOC Progression Note (Addendum)
 Transition of Care [facility]) - Progression Note    Patient Details  Name: Derrick Blanchard MRN: [id number] Date of Birth: 1981-08-19  Transition [facility] Care [facility]) CM/SW Contact  Crayton Docker, RN Phone Number: 01/22/2024, 1:43 PM  Clinical Narrative:     CM follow up call to Admissions, [facility], [place], [place]. No answer, CM left message for Melissa Talvert to call CM back.   CM follow up call to Admissions, [facility],phone: [phone], no answer, CM left message for Amber to call CM back regarding accepted bed offer.   CM follow up call to Admissions, [facility], [place]. No answer, CM left message for Admissions to call CM back.    Expected Discharge Plan: Skilled Nursing Facility Barriers to Discharge: Continued Medical Work up  Expected Discharge Plan and Services   Discharge Planning Services: CM Consult Post Acute Care Choice: Skilled Nursing Facility Living arrangements for the past 2 months: Single Family Home                   DME Agency: NA    HH Arranged: NA   Social Determinants of Health (SDOH) Interventions SDOH Screenings   Food Insecurity: Food Insecurity Present (01/15/2024)  Housing: High Risk (01/15/2024)  Transportation Needs: Unmet Transportation Needs (01/15/2024)  Utilities: Not At Risk (01/15/2024)  Recent Concern: Utilities - At Risk (12/27/2023)  Financial Resource Strain: High Risk (03/22/2022)   Received from [facility], [facility]  Social Connections: Unknown (01/15/2024)  Tobacco Use: High Risk (01/15/2024)    Readmission Risk Interventions     No data to display

## 2024-01-22 NOTE — Progress Notes (Signed)
 PT Cancellation Note  Patient Details Name: Derrick Blanchard MRN: [id number] DOB: 1981-06-12   Cancelled Treatment:    Reason Eval/Treat Not Completed: Fatigue/lethargy limiting ability to participate. Pt received sitting upright in bed. Pleasantly declining PT due to pain from earlier dressing change and fatigue. Pt agreeable for session tomorrow. PT to re-attempt as able.    Marc Senior. Fairly IV, PT, DPT Physical Therapist- [facility]  01/22/2024, 3:18 PM

## 2024-01-23 ENCOUNTER — Other Ambulatory Visit: Payer: Self-pay

## 2024-01-23 DIAGNOSIS — T8130XA Disruption of wound, unspecified, initial encounter: Secondary | ICD-10-CM | POA: Diagnosis not present

## 2024-01-23 LAB — CBC WITH DIFFERENTIAL/PLATELET
Abs Immature Granulocytes: 0 10*3/uL (ref 0.00–0.07)
Basophils Absolute: 0 10*3/uL (ref 0.0–0.1)
Basophils Relative: 1 %
Eosinophils Absolute: 0.1 10*3/uL (ref 0.0–0.5)
Eosinophils Relative: 3 %
HCT: 25.3 % — ABNORMAL LOW (ref 39.0–52.0)
Hemoglobin: 8 g/dL — ABNORMAL LOW (ref 13.0–17.0)
Immature Granulocytes: 0 %
Lymphocytes Relative: 21 %
Lymphs Abs: 0.4 10*3/uL — ABNORMAL LOW (ref 0.7–4.0)
MCH: 26.2 pg (ref 26.0–34.0)
MCHC: 31.6 g/dL (ref 30.0–36.0)
MCV: 83 fL (ref 80.0–100.0)
Monocytes Absolute: 0.3 10*3/uL (ref 0.1–1.0)
Monocytes Relative: 18 %
Neutro Abs: 1.1 10*3/uL — ABNORMAL LOW (ref 1.7–7.7)
Neutrophils Relative %: 57 %
Platelets: 56 10*3/uL — ABNORMAL LOW (ref 150–400)
RBC: 3.05 MIL/uL — ABNORMAL LOW (ref 4.22–5.81)
RDW: 20.5 % — ABNORMAL HIGH (ref 11.5–15.5)
WBC: 1.9 10*3/uL — ABNORMAL LOW (ref 4.0–10.5)
nRBC: 0 % (ref 0.0–0.2)

## 2024-01-23 LAB — BASIC METABOLIC PANEL WITH GFR
Anion gap: 7 (ref 5–15)
BUN: 7 mg/dL (ref 6–20)
CO2: 21 mmol/L — ABNORMAL LOW (ref 22–32)
Calcium: 8.5 mg/dL — ABNORMAL LOW (ref 8.9–10.3)
Chloride: 108 mmol/L (ref 98–111)
Creatinine, Ser: 0.51 mg/dL — ABNORMAL LOW (ref 0.61–1.24)
GFR, Estimated: >60 mL/min (ref >60)
Glucose, Bld: 127 mg/dL — ABNORMAL HIGH (ref 70–99)
Potassium: 3.4 mmol/L — ABNORMAL LOW (ref 3.5–5.1)
Sodium: 136 mmol/L (ref 135–145)

## 2024-01-23 MED ORDER — DOXYCYCLINE HYCLATE 100 MG PO TABS
100.0000 mg | ORAL_TABLET | Freq: Two times a day (BID) | ORAL | 0 refills | Status: AC
  Filled 2024-01-23: qty 14, 7d supply, fill #0

## 2024-01-23 MED ORDER — THIAMINE HCL 100 MG PO TABS
100.0000 mg | ORAL_TABLET | Freq: Every day | ORAL | 0 refills | Status: AC
  Filled 2024-01-23: qty 30, 30d supply, fill #0

## 2024-01-23 MED ORDER — OXYCODONE HCL 10 MG PO TABS
10.0000 mg | ORAL_TABLET | ORAL | 0 refills | Status: DC | PRN
  Filled 2024-01-23: qty 20, 3d supply, fill #0

## 2024-01-23 MED ORDER — PANTOPRAZOLE SODIUM 40 MG PO TBEC
40.0000 mg | DELAYED_RELEASE_TABLET | Freq: Every day | ORAL | 0 refills | Status: DC
  Filled 2024-01-23: qty 30, 30d supply, fill #0

## 2024-01-23 MED ORDER — OXYCODONE HCL 5 MG PO TABS
10.0000 mg | ORAL_TABLET | ORAL | Status: DC | PRN
  Administered 2024-01-23: 15 mg via ORAL
  Filled 2024-01-23: qty 3

## 2024-01-23 MED ORDER — LACTULOSE 10 GM/15ML PO SOLN
20.0000 g | Freq: Every day | ORAL | 0 refills | Status: AC
  Filled 2024-01-23: qty 236, 7d supply, fill #0

## 2024-01-23 MED ORDER — ADULT MULTIVITAMIN W/MINERALS CH: 1.0000 | ORAL_TABLET | Freq: Every day | ORAL | 0 refills | Status: AC

## 2024-01-23 MED ORDER — FOLIC ACID 1 MG PO TABS
1.0000 mg | ORAL_TABLET | Freq: Every day | ORAL | 0 refills | Status: AC
  Filled 2024-01-23: qty 30, 30d supply, fill #0

## 2024-01-23 NOTE — Plan of Care (Signed)

## 2024-01-23 NOTE — TOC Transition Note (Signed)
 Transition of Care [facility]) - Discharge Note   Patient Details  Name: Derrick Blanchard MRN: [id number] Date of Birth: [birth year]  Transition of Care [facility]) CM/SW Contact:  Crayton Docker, RN 01/23/2024, 3:33 PM   Clinical Narrative:     Patient discharge against medical advice.   Final next level of care: Home/Self Care Barriers to Discharge:  (Against Medical Advice)   Patient Goals and CMS Choice    Patient discharged against medical advice  Discharge Placement     Discharged home/self care             Discharge Plan and Services Additional resources added to the After Visit Summary for     Discharge Planning Services: CM Consult Post Acute Care Choice: Skilled Nursing Facility            DME Agency: NA     HH Arranged: NA    Social Drivers of Health (SDOH) Interventions SDOH Screenings   Food Insecurity: Food Insecurity Present (01/15/2024)  Housing: High Risk (01/15/2024)  Transportation Needs: Unmet Transportation Needs (01/15/2024)  Utilities: Not At Risk (01/15/2024)  Recent Concern: Utilities - At Risk (12/27/2023)  Financial Resource Strain: High Risk (03/22/2022)   Received from [facility], [facility]  Social Connections: Unknown (01/15/2024)  Tobacco Use: High Risk (01/15/2024)     Readmission Risk Interventions     No data to display

## 2024-01-23 NOTE — Progress Notes (Signed)
 PROGRESS NOTE    Derrick Blanchard  ZOX:[id number] DOB: 1981-01-09 DOA: 01/15/2024 PCP: Pcp, No    Brief Narrative:   43 y.o. male with medical history significant of recent left transmet amputation.  Patient states that he has been having pain and he has left foot and leg since yesterday.  He states the morphine  is not helping him.  He ran out of his pain pills.  He has been taking doxycycline .  He stated that his foot wound opened up yesterday.  Came to the ER for further evaluation.  Case discussed with podiatry and he will need to be off his foot for a good period of time.  Will try conservative management with wound care at this point.    5/13.  Patient still having a lot of pain in his foot.  Hemoglobin drifted down to 7.8.  CRP less than 0.5 and ESR 21.  Continue oral antibiotics.  Seen by podiatry and recommended wound care and we will have to close by secondary intention.  Patient will be nonweightbearing on the left foot.  PT and OT evaluations.  Assessment & Plan:   Principal Problem:   Wound dehiscence Active Problems:   Left leg pain   ALC (alcoholic liver cirrhosis) (HCC)   Pancytopenia (HCC)   Alcohol use disorder   Generalized weakness  * Wound dehiscence CRP less than 0.5 and ESR 21.   Podiatry consulted No surgical indications.  Wound will need to close by secondary intention Per podiatry if wound does not close or patient is nonadherent to nonweightbearing restrictions he will likely need a proximal amputation 5/20: Pictures in chart placed by RN.  Demonstrate persistent wound dehiscence.  Discussed case with podiatry.  Recommend vascular consult for BKA.  Vascular consulted Plan: Continue pain control.  No IV narcotics.  Continue p.o. doxycycline .  Continue therapy efforts.  Vascular surgery engaged for consideration for BKA.  Left leg pain Ultrasound negative for DVT.   Continue with multimodal pain control as above   ALC (alcoholic liver cirrhosis)  (HCC) Patient also has pancytopenia, coagulopathy, portal hypertension.  Hemoglobin stable in the low 8.  No indication for transfusion.  Saturating well on room air.    Alcohol use disorder Mild withdrawal noted on 5/15.  No evidence of withdrawal since.  Discontinue CIWA   Generalized weakness Continue evaluations as tolerated.  Patient is interested in going to rehab.  Patient will benefit from skilled nursing facility placement as next level of care.  Disposition plan currently on hold pending vascular surgery consultation      DVT prophylaxis: SCD Code Status: Full Family Communication: None Disposition Plan: Status is: Inpatient Remains inpatient appropriate because: Wound dehiscence.  Inadequate pain control   Level of care: Med-Surg  Consultants:  Podiatry  Procedures:  None  Antimicrobials: Doxycycline    Subjective: Seen and examined.  Sitting up in chair.  Continues to endorse pain in left foot stump  Objective: Vitals:   01/22/24 1607 01/22/24 1955 01/23/24 0411 01/23/24 0826  BP: 111/71 112/67 (!) 104/57 (!) 107/57  Pulse: 87 78 71 80  Resp: 16 19 19 15   Temp: 97.7 F (36.5 C) 97.8 F (36.6 C) 98.3 F (36.8 C) 97.9 F (36.6 C)  TempSrc: Oral Oral  Oral  SpO2: 100% 100% 100% 97%  Weight:      Height:        Intake/Output Summary (Last 24 hours) at 01/23/2024 1235 Last data filed at 01/23/2024 1048 Gross per 24 hour  Intake 290 ml  Output 3800 ml  Net -3510 ml   Filed Weights   01/15/24 1006  Weight: 98 kg    Examination:  General exam: No acute distress.  Appears fatigued Respiratory system: Lungs clear.  Normal work of breathing.  Room air Cardiovascular system: S1-S2, RRR, no murmurs, no pedal edema Gastrointestinal system: Soft NT/ND, normal bowel sounds Central nervous system: Alert and oriented. No focal neurological deficits. Extremities: Status post bilateral TMA.  Left lower extremity in surgical dressing.  Dressing CDI.  No pain  on palpation Skin: No rashes, lesions or ulcers Psychiatry: Judgement and insight appear normal. Mood & affect appropriate.     Data Reviewed: I have personally reviewed following labs and imaging studies  CBC: Recent Labs  Lab 01/17/24 0536 01/20/24 0832 01/23/24 0830  WBC 1.7* 2.3* 1.9*  NEUTROABS  --  1.5* 1.1*  HGB 7.7* 7.5* 8.0*  HCT 24.4* 24.1* 25.3*  MCV 83.0 82.5 83.0  PLT 47* 60* 56*   Basic Metabolic Panel: Recent Labs  Lab 01/20/24 0832 01/22/24 0534 01/23/24 0830  NA 135  --  136  K 3.8  --  3.4*  CL 106  --  108  CO2 22  --  21*  GLUCOSE 102*  --  127*  BUN 6  --  7  CREATININE 0.55* 0.57* 0.51*  CALCIUM  8.0*  --  8.5*   GFR: Estimated Creatinine Clearance: 138.2 mL/min (A) (by C-G formula based on SCr of 0.51 mg/dL (L)). Liver Function Tests: No results for input(s): "AST", "ALT", "ALKPHOS", "BILITOT", "PROT", "ALBUMIN" in the last 168 hours.  No results for input(s): "LIPASE", "AMYLASE" in the last 168 hours. No results for input(s): "AMMONIA" in the last 168 hours.  Coagulation Profile: No results for input(s): "INR", "PROTIME" in the last 168 hours. Cardiac Enzymes: No results for input(s): "CKTOTAL", "CKMB", "CKMBINDEX", "TROPONINI" in the last 168 hours. BNP (last 3 results) No results for input(s): "PROBNP" in the last 8760 hours. HbA1C: No results for input(s): "HGBA1C" in the last 72 hours. CBG: Recent Labs  Lab 01/17/24 1649  GLUCAP 124*   Lipid Profile: No results for input(s): "CHOL", "HDL", "LDLCALC", "TRIG", "CHOLHDL", "LDLDIRECT" in the last 72 hours. Thyroid Function Tests: No results for input(s): "TSH", "T4TOTAL", "FREET4", "T3FREE", "THYROIDAB" in the last 72 hours. Anemia Panel: No results for input(s): "VITAMINB12", "FOLATE", "FERRITIN", "TIBC", "IRON", "RETICCTPCT" in the last 72 hours. Sepsis Labs: No results for input(s): "PROCALCITON", "LATICACIDVEN" in the last 168 hours.  No results found for this or any  previous visit (from the past 240 hours).       Radiology Studies: No results found.       Scheduled Meds:  acetaminophen   500 mg Oral Q6H   doxycycline   100 mg Oral Q12H   enoxaparin  (LOVENOX ) injection  50 mg Subcutaneous Q24H   folic acid   1 mg Oral Daily   lactulose   20 g Oral Daily   multivitamin with minerals  1 tablet Oral Daily   pantoprazole   40 mg Oral Daily   thiamine   100 mg Oral Daily   Or   thiamine   100 mg Intravenous Daily   Continuous Infusions:   LOS: 8 days     Tiajuana Fluke, MD [facility]   If 7PM-7AM, please contact night-coverage  01/23/2024, 12:35 PM

## 2024-01-23 NOTE — Discharge Summary (Signed)
 Physician Discharge Summary  Derrick Blanchard:[id number] DOB: May 24, 1981 DOA: 01/15/2024  PCP: Pcp, No  Admit date: 01/15/2024 Discharge date: 01/23/2024  Admitted From: Home Disposition:  Home  Recommendations for Outpatient Follow-up:  Follow up with PCP in 1-2 weeks   Home Health:No  Equipment/Devices:None  Discharge Condition:Stable  CODE STATUS:Full Diet recommendation: Reg  Brief/Interim Summary:  Brief Narrative:    43 y.o. male with medical history significant of recent left transmet amputation.  Patient states that he has been having pain and he has left foot and leg since yesterday.  He states the morphine  is not helping him.  He ran out of his pain pills.  He has been taking doxycycline .  He stated that his foot wound opened up yesterday.  Came to the ER for further evaluation.  Case discussed with podiatry and he will need to be off his foot for a good period of time.  Will try conservative management with wound care at this point.    5/13.  Patient still having a lot of pain in his foot.  Hemoglobin drifted down to 7.8.  CRP less than 0.5 and ESR 21.  Continue oral antibiotics.  Seen by podiatry and recommended wound care and we will have to close by secondary intention.  Patient will be nonweightbearing on the left foot.  PT and OT evaluations.   5/20: On RN wound  eval, worsening dehiscence was noted.  Lengthy conversation with patient.  Reiterated need for BKA.  Vascular surgery engaged for consultation.  Patient continued to refuse BKA at our facility and wished to present to [facility].    Per patient, he was "allowed to stay at [facility] for 36 days" after his right TMA to allow for wound closure.  I explained to him that the left TMA wound was dehisced to the point that we did not feel the wound would close.  Thus the recommendation for BKA to give the greatest chance of wound healing and potential for prosthesis.  After vascular surgery consultation, the patient stated  that he would return to [facility] for second opinion and that they would "let him stay there"  The patient is of sound mind.  He has decision making capability.  At this time we do not have an accepting SNF.  He is not septic and the wound currently does not show signs of infection.  He will be allowed to discharge home without the need to [facility].  Pain control and antibiotics have been sent to [facility] pharmacy.  Patient is at high risk for clinical deterioration and subsequent hospital readmission.  He had previously been non adherent to weight bearing restrictions and this resulted in left TMA wound dehiscence.      Discharge Diagnoses:  Principal Problem:   Wound dehiscence Active Problems:   Left leg pain   ALC (alcoholic liver cirrhosis) (HCC)   Pancytopenia (HCC)   Alcohol use disorder   Generalized weakness    * Wound dehiscence CRP less than 0.5 and ESR 21.   Podiatry consulted No surgical indications.  Wound will need to close by secondary intention Per podiatry if wound does not close or patient is nonadherent to nonweightbearing restrictions he will likely need a proximal amputation 5/20: Pictures in chart placed by RN.  Demonstrate persistent wound dehiscence.  Discussed case with podiatry.  Recommend vascular consult for BKA.  Vascular consulted Plan: Patient declined vascular surgery intervention at [facility].  Will discharge home as he does not wish to wait for  SNF placement.  At time of dc we do not have an accepting facility.  Patient states he will present to [facility] for second opinion.  Pain control and abx have been prescribed.  Discharge Instructions  Discharge Instructions     Diet - low sodium heart healthy   Complete by: As directed    Discharge wound care:   Complete by: As directed    Wound care  Daily      Comments: Cleanse with Vashe (#[id number]), Daily dressing change recommended with Aquacel Ag contact layer for drainage followed by 4 x 4 gauze Kerlix roll to wrap and  Ace wrap to secure   Increase activity slowly   Complete by: As directed       Allergies as of 01/23/2024   No Known Allergies      Medication List     STOP taking these medications    polyethylene glycol powder 17 GM/SCOOP powder Commonly known as: GLYCOLAX /MIRALAX        TAKE these medications    doxycycline  100 MG tablet Commonly known as: VIBRA -TABS Take 1 tablet (100 mg total) by mouth every 12 (twelve) hours for 7 days.   folic acid  1 MG tablet Commonly known as: FOLVITE  Take 1 tablet (1 mg total) by mouth daily.   lactulose  10 GM/15ML solution Commonly known as: CHRONULAC  Take 30 mLs (20 g total) by mouth daily. Start taking on: Jan 24, 2024   multivitamin with minerals Tabs tablet Take 1 tablet by mouth daily.   Oxycodone  HCl 10 MG Tabs Take 1-2 tablets (10-20 mg total) by mouth every 3 (three) hours as needed for moderate pain (pain score 4-6) or severe pain (pain score 7-10).   pantoprazole  40 MG tablet Commonly known as: PROTONIX  Take 1 tablet (40 mg total) by mouth daily.   thiamine  100 MG tablet Commonly known as: Vitamin B-1 Take 1 tablet (100 mg total) by mouth daily.               Discharge Care Instructions  (From admission, onward)           Start     Ordered   01/23/24 0000  Discharge wound care:       Comments: Wound care  Daily      Comments: Cleanse with Vashe (#[id number]), Daily dressing change recommended with Aquacel Ag contact layer for drainage followed by 4 x 4 gauze Kerlix roll to wrap and Ace wrap to secure   01/23/24 1322            No Known Allergies  Consultations: Podiatry Vascular surgery   Procedures/Studies: US  Venous Img Lower Bilateral (DVT) Result Date: 01/15/2024 CLINICAL DATA:  Swelling. EXAM: BILATERAL LOWER EXTREMITY VENOUS DOPPLER ULTRASOUND TECHNIQUE: Gray-scale sonography with compression, as well as color and duplex ultrasound, were performed to evaluate the deep venous system(s) from the  level of the common femoral vein through the popliteal and proximal calf veins. COMPARISON:  Left lower extremity duplex 11/07/2018 FINDINGS: VENOUS Normal compressibility of the common femoral, superficial femoral, and popliteal veins, as well as the visualized calf veins. Visualized portions of profunda femoral vein and great saphenous vein unremarkable. No filling defects to suggest DVT on grayscale or color Doppler imaging. Doppler waveforms show normal direction of venous flow, normal respiratory plasticity and response to augmentation. OTHER None. Limitations: none IMPRESSION: No sonographic evidence of bilateral lower extremity DVT. Electronically Signed   By: Chadwick Colonel M.D.   On: 01/15/2024 15:48   CT HEAD  WO CONTRAST ( ) Result Date: 01/07/2024 CLINICAL DATA:  43 year old male status post recent lower extremity surgery. Wound dehiscence, pain. Coagulopathy, head trauma. EXAM: CT HEAD WITHOUT CONTRAST TECHNIQUE: Contiguous axial images were obtained from the base of the skull through the vertex without intravenous contrast. RADIATION DOSE REDUCTION: This exam was performed according to the departmental dose-optimization program which includes automated exposure control, adjustment of the mA and/or kV according to patient size and/or use of iterative reconstruction technique. COMPARISON:  Head CT 03/10/2023. FINDINGS: Brain: No midline shift, ventriculomegaly, mass effect, evidence of mass lesion, intracranial hemorrhage or evidence of cortically based acute infarction. Chronic cerebral volume loss for age appears generalized, not significantly changed from last year. Gray-white differentiation is stable and within limits. Vascular: No suspicious intracranial vascular hyperdensity. Skull: Stable, intact.  No acute osseous abnormality identified. Sinuses/Orbits: Visualized paranasal sinuses and mastoids are stable and well aerated. Other: Leftward gaze. No acute orbit or scalp soft tissue injury  identified. IMPRESSION: 1. No acute intracranial abnormality or acute traumatic injury identified. 2. Cerebral volume loss seems chronically advanced for age. Electronically Signed   By: Marlise Simpers M.D.   On: 01/07/2024 03:54   DG Foot Complete Left Result Date: 01/06/2024 CLINICAL DATA:  Status post amputation with bleeding wound, initial encounter EXAM: LEFT FOOT - COMPLETE 3+ VIEW COMPARISON:  01/03/2024 FINDINGS: Changes consistent with transmetatarsal amputation are seen. Surgical staples are noted in place. No erosive changes are noted. Previously seen subcutaneous air has resolved. IMPRESSION: Changes consistent with transmetatarsal amputation. No acute bony abnormality is noted. Electronically Signed   By: Violeta Grey M.D.   On: 01/06/2024 03:54   DG Foot 2 Views Left Result Date: 01/03/2024 CLINICAL DATA:  Status post amputation EXAM: LEFT FOOT - 2 VIEW COMPARISON:  12/29/23 FINDINGS: There are changes consistent with transmetatarsal amputation new from the prior exam. Air is noted in the soft tissue wound consistent with the postoperative state. No definitive erosive changes are noted. IMPRESSION: Interval transmetatarsal amputation. Electronically Signed   By: Violeta Grey M.D.   On: 01/03/2024 10:22   DG Foot 2 Views Left Result Date: 12/29/2023 CLINICAL DATA:  Postoperative. EXAM: LEFT FOOT - 2 VIEW COMPARISON:  Left foot x-ray 12/27/2023 FINDINGS: There is been amputation of the mid and proximal second and first metatarsals as well as first phalanx. Antibiotic seeds are seen overlying the surgical site. There is soft tissue swelling, soft tissue air and skin staples compatible with recent surgery. There is no acute fracture or dislocation. There is dorsal foot soft tissue swelling. IMPRESSION: Status post amputation of the mid and proximal second and first metatarsals as well as first phalanx. Electronically Signed   By: Tyron Gallon M.D.   On: 12/29/2023 17:35   MR FOOT LEFT W WO  CONTRAST Result Date: 12/27/2023 CLINICAL DATA:  Status post incision and drainage on 12/16/2023 with increased pain and swelling. EXAM: MRI OF THE LEFT FOREFOOT WITHOUT AND WITH CONTRAST TECHNIQUE: Multiplanar, multisequence MR imaging of the left forefoot was performed both before and after administration of intravenous contrast. CONTRAST:  10mL GADAVIST  GADOBUTROL  1 MMOL/ML IV SOLN COMPARISON:  Left foot radiographs dated 12/27/2023. MRI of the left foot dated 12/13/2023. FINDINGS: Bones/Joint/Cartilage Status post interval incision and drainage with new soft tissue defect at the medial plantar forefoot, overlying the proximal to mid great toe. This soft tissue defect/wound measures up to 4.2 cm AP x 4.1 cm TR x 1.8 cm CC and extends deep to the level of the  plantar margin of the flexor hallucis longus tendon at the level of the first MTP joint as well as the plantar surface of the lateral hallux sesamoid which demonstrates marrow signal abnormality with corresponding enhancement, increased since the prior exam, concerning for osteomyelitis. There is marrow signal abnormality with associated enhancement of the first proximal phalanx and the distal 2/3 of the first metatarsal. Focal region of T2 hyperintense marrow signal abnormality within the lateral aspect of the first metatarsal head with peripheral enhancement (series 12, image 15 and series 5, image 23). These findings could relate to interval postsurgical change, however, osteomyelitis can not be excluded. The soft tissue defect/wound appears to extend deep to the medial plantar margin of the second metatarsal head which demonstrates marrow signal abnormality with mild enhancement, which could reflect interval postsurgical change, however, osteomyelitis can not be excluded. Prior amputation of the second digit the level of second MTP joint. Mild degenerative changes of the first MTP joint. No significant joint effusion. Ligaments Lisfranc ligament is  intact. Muscles and Tendons Flexor and extensor compartments are intact. Trace tenosynovitis of the flexor hallucis longus tendon. Diffuse atrophy of the intrinsic musculature of the foot, likely secondary to chronic denervation changes. Soft tissue Status post interval incision and drainage with large soft tissue defect at the medial plantar forefoot, as described above, with surrounding edema and enhancement. No loculated fluid collection. IMPRESSION: 1. Status post interval incision and drainage with new large soft tissue defect at the medial plantar forefoot, overlying the proximal to mid great toe, measuring up to 4.2 x 4.1 x 1.8 cm. This collection extends deep to the plantar surface of the lateral hallux sesamoid, which demonstrates marrow signal abnormality with corresponding enhancement, increased since the prior exam, concerning for osteomyelitis. 2. Marrow signal abnormality with associated enhancement of the first proximal phalanx and the distal 2/3 of the first metatarsal. These findings could relate to interval postsurgical change, however, osteomyelitis can not be excluded. 3. The soft tissue defect/wound appears to extend deep to the medial plantar margin of the second metatarsal head which demonstrates marrow signal abnormality with mild enhancement, which could reflect interval postsurgical change, however, osteomyelitis can not be excluded. 4. Prior amputation of second ray at the level of the second MTP joint. Electronically Signed   By: Mannie Seek M.D.   On: 12/27/2023 17:23   DG Foot Complete Left Result Date: 12/27/2023 CLINICAL DATA:  Infection status post second toe resection EXAM: LEFT FOOT - COMPLETE 3+ VIEW COMPARISON:  December 13, 2023 FINDINGS: There is no evidence of fracture or dislocation. There is no evidence of arthropathy or other focal bone abnormality. Soft tissues are unremarkable. IMPRESSION: CORTICAL IRREGULARITY IS NOTED WITHIN THE MEDIAL CORTEX OF THE PROXIMAL PHALANX  OF THE FIRST TOE.  COULD CORRELATE WITH RECENT TRAUMA AND SMALL CORTICAL FRACTURE.: IMPRESSION: CORTICAL IRREGULARITY IS NOTED WITHIN THE MEDIAL CORTEX OF THE PROXIMAL PHALANX OF THE FIRST TOE. COULD CORRELATE WITH RECENT TRAUMA AND SMALL CORTICAL FRACTURE. Post prior second toe amputation.  No evidence of osteomyelitis Electronically Signed   By: Fredrich Jefferson M.D.   On: 12/27/2023 11:51      Subjective: Seen and examined on day of discharge.  Stable, pain controlled.  Discharge Exam: Vitals:   01/23/24 0411 01/23/24 0826  BP: (!) 104/57 (!) 107/57  Pulse: 71 80  Resp: 19 15  Temp: 98.3 F (36.8 C) 97.9 F (36.6 C)  SpO2: 100% 97%   Vitals:   01/22/24 1607 01/22/24 1955 01/23/24 0411  01/23/24 0826  BP: 111/71 112/67 (!) 104/57 (!) 107/57  Pulse: 87 78 71 80  Resp: 16 19 19 15   Temp: 97.7 F (36.5 C) 97.8 F (36.6 C) 98.3 F (36.8 C) 97.9 F (36.6 C)  TempSrc: Oral Oral  Oral  SpO2: 100% 100% 100% 97%  Weight:      Height:        General: Pt is alert, awake, not in acute distress Cardiovascular: RRR, S1/S2 +, no rubs, no gallops Respiratory: CTA bilaterally, no wheezing, no rhonchi Abdominal: Soft, NT, ND, bowel sounds + Extremities: BL TMA    The results of significant diagnostics from this hospitalization (including imaging, microbiology, ancillary and laboratory) are listed below for reference.     Microbiology: No results found for this or any previous visit (from the past 240 hours).   Labs: BNP (last 3 results) No results for input(s): "BNP" in the last 8760 hours. Basic Metabolic Panel: Recent Labs  Lab 01/20/24 0832 01/22/24 0534 01/23/24 0830  NA 135  --  136  K 3.8  --  3.4*  CL 106  --  108  CO2 22  --  21*  GLUCOSE 102*  --  127*  BUN 6  --  7  CREATININE 0.55* 0.57* 0.51*  CALCIUM  8.0*  --  8.5*   Liver Function Tests: No results for input(s): "AST", "ALT", "ALKPHOS", "BILITOT", "PROT", "ALBUMIN" in the last 168 hours. No results for  input(s): "LIPASE", "AMYLASE" in the last 168 hours. No results for input(s): "AMMONIA" in the last 168 hours. CBC: Recent Labs  Lab 01/17/24 0536 01/20/24 0832 01/23/24 0830  WBC 1.7* 2.3* 1.9*  NEUTROABS  --  1.5* 1.1*  HGB 7.7* 7.5* 8.0*  HCT 24.4* 24.1* 25.3*  MCV 83.0 82.5 83.0  PLT 47* 60* 56*   Cardiac Enzymes: No results for input(s): "CKTOTAL", "CKMB", "CKMBINDEX", "TROPONINI" in the last 168 hours. BNP: Invalid input(s): "POCBNP" CBG: Recent Labs  Lab 01/17/24 1649  GLUCAP 124*   D-Dimer No results for input(s): "DDIMER" in the last 72 hours. Hgb A1c No results for input(s): "HGBA1C" in the last 72 hours. Lipid Profile No results for input(s): "CHOL", "HDL", "LDLCALC", "TRIG", "CHOLHDL", "LDLDIRECT" in the last 72 hours. Thyroid function studies No results for input(s): "TSH", "T4TOTAL", "T3FREE", "THYROIDAB" in the last 72 hours.  Invalid input(s): "FREET3" Anemia work up No results for input(s): "VITAMINB12", "FOLATE", "FERRITIN", "TIBC", "IRON", "RETICCTPCT" in the last 72 hours. Urinalysis    Component Value Date/Time   COLORURINE YELLOW (A) 03/11/2023 0003   APPEARANCEUR CLEAR (A) 03/11/2023 0003   APPEARANCEUR Clear 02/01/2013 1851   LABSPEC 1.006 03/11/2023 0003   LABSPEC 1.015 02/01/2013 1851   PHURINE 6.0 03/11/2023 0003   GLUCOSEU NEGATIVE 03/11/2023 0003   GLUCOSEU 50 mg/dL 60/45/4098 1191   HGBUR NEGATIVE 03/11/2023 0003   BILIRUBINUR NEGATIVE 03/11/2023 0003   BILIRUBINUR 2+ 02/01/2013 1851   KETONESUR NEGATIVE 03/11/2023 0003   PROTEINUR NEGATIVE 03/11/2023 0003   NITRITE NEGATIVE 03/11/2023 0003   LEUKOCYTESUR NEGATIVE 03/11/2023 0003   LEUKOCYTESUR Negative 02/01/2013 1851   Sepsis Labs Recent Labs  Lab 01/17/24 0536 01/20/24 0832 01/23/24 0830  WBC 1.7* 2.3* 1.9*   Microbiology No results found for this or any previous visit (from the past 240 hours).   Time coordinating discharge: Over 30 minutes  SIGNED:   Tiajuana Fluke, MD  [facility] 01/23/2024, 1:22 PM Pager   If 7PM-7AM, please contact night-coverage

## 2024-01-23 NOTE — Progress Notes (Signed)
 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 Pt returns to ED after extensive bleeding at surgical dressing, pt not adherent to NWB as instructed.    PT Comments  Pt received upright in bed agreeable to PT. Pain present but no inhibiting participation. Pt known to author from prior admission and pt appears more cognitively intact, aware of NWB status and is able to maintain entirety of session. Pt is mod-I with bed mobility, supervision for STS to RW maintaining NWB but does need bed railing for leverage to successfully perform. Varying from supervision to CGA for hop to pattern on RLE using RW. Able to Complete ~64' but does require brief standing rest breaks. Immediate needs for CGA with turning in hallway and to recliner as pt keeps RW far outside of BOS taking large hops leading to lateral balance loss needing CGA and hallway wall to prevent balance loss. Once again upon transferring to recliner pt has unsafe use of RW and reaches for recliner far away leaving RW and leaning over into recliner to turn and sit impulsively. Time spent via PT demo on education for safe turning practices using RW to reduce falls risk and maximize safety with sitting transfers. Pt verbalized understanding and left in care of RN. All needs in reach with d/c recs remaining appropriate.    If plan is discharge home, recommend the following: A little help with walking and/or transfers;A little help with bathing/dressing/bathroom;Help with stairs or ramp for entrance;Assist for transportation   Can travel by private vehicle     Yes  Equipment Recommendations  Wheelchair (measurements PT);BSC/3in1    Recommendations for Other  Services       Precautions / Restrictions Precautions Precautions: Fall Recall of Precautions/Restrictions: Intact Restrictions Weight Bearing Restrictions Per Provider Order: Yes LLE Weight Bearing Per Provider Order: Non weight bearing     Mobility  Bed Mobility Overal bed mobility: Modified Independent               Patient Response: Cooperative  Transfers Overall transfer level: Needs assistance Equipment used: Rolling walker (2 wheels) Transfers: Sit to/from Stand Sit to Stand: Supervision           General transfer comment: use of hand rails to stand at EOB.    Ambulation/Gait Ambulation/Gait assistance: Contact guard assist, Supervision Gait Distance (Feet): 64 Feet Assistive device: Rolling walker (2 wheels)         General Gait Details: hop to gait. Grossly supervision for straight line hopping. Need immediate CGA and multi modal cuing to safely turn with RW due to poor safety awareness and RW use reliant on wall in hallway to assist.   Stairs             Wheelchair Mobility     Tilt Bed Tilt Bed Patient Response: Cooperative  Modified Rankin (Stroke Patients Only)       Balance Overall balance assessment: Needs assistance Sitting-balance support: Single extremity supported Sitting balance-Leahy Scale: Normal     Standing balance support: Bilateral upper extremity supported, Reliant on assistive device for balance Standing balance-Leahy Scale: Fair                              Communication  Communication Communication: No apparent difficulties  Cognition Arousal: Alert Behavior During Therapy: WFL for tasks assessed/performed   PT - Cognitive impairments: History of cognitive impairments, Safety/Judgement                         Following commands: Intact      Cueing Cueing Techniques: Verbal cues  Exercises Other Exercises Other Exercises: PT demo and education on how to safely turn to reduce  falls risk    General Comments General comments (skin integrity, edema, etc.): maintains NWB throughout entirety of session      Pertinent Vitals/Pain Pain Assessment Faces Pain Scale: Hurts little more Pain Location: L foot, incision Pain Descriptors / Indicators: Discomfort, Grimacing Pain Intervention(s): Limited activity within patient's tolerance, Monitored during session, Repositioned, Patient requesting pain meds-RN notified    Home Living                          Prior Function            PT Goals (current goals can now be found in the care plan section) Acute Rehab PT Goals Patient Stated Goal: to go to SNF PT Goal Formulation: With patient Time For Goal Achievement: 01/30/24 Potential to Achieve Goals: Good Progress towards PT goals: Progressing toward goals    Frequency    Min 2X/week      PT Plan      Co-evaluation              AM-PAC PT "6 Clicks" Mobility   Outcome Measure  Help needed turning from your back to your side while in a flat bed without using bedrails?: None Help needed moving from lying on your back to sitting on the side of a flat bed without using bedrails?: None Help needed moving to and from a bed to a chair (including a wheelchair)?: A Little Help needed standing up from a chair using your arms (e.g., wheelchair or bedside chair)?: A Little Help needed to walk in hospital room?: A Little Help needed climbing 3-5 steps with a railing? : A Lot 6 Click Score: 19    End of Session Equipment Utilized During Treatment: Gait belt Activity Tolerance: Patient tolerated treatment well Patient left: in chair;with chair alarm set;with call bell/phone within reach;with nursing/sitter in room Nurse Communication: Mobility status PT Visit Diagnosis: Muscle weakness (generalized) (M62.81);Difficulty in walking, not elsewhere classified (R26.2);Pain Pain - Right/Left: Left Pain - part of body: Ankle and joints of foot      Time: 9147-8295 PT Time Calculation (min) (ACUTE ONLY): 18 min  Charges:    $Gait Training: 8-22 mins PT General Charges $$ ACUTE PT VISIT: 1 Visit                     Marc Senior. Fairly IV, PT, DPT Physical Therapist- [place]  [facility]  01/23/2024, 10:42 AM

## 2024-02-11 ENCOUNTER — Emergency Department

## 2024-02-11 ENCOUNTER — Other Ambulatory Visit: Payer: Self-pay

## 2024-02-11 ENCOUNTER — Inpatient Hospital Stay
Admission: EM | Admit: 2024-02-11 | Discharge: 2024-02-23 | DRG: 475 | Disposition: A | Discharge status: Skilled Nursing Facility | Attending: Internal Medicine | Admitting: Internal Medicine

## 2024-02-11 DIAGNOSIS — B182 Chronic viral hepatitis C: Secondary | ICD-10-CM | POA: Diagnosis present

## 2024-02-11 DIAGNOSIS — D62 Acute posthemorrhagic anemia: Secondary | ICD-10-CM | POA: Diagnosis not present

## 2024-02-11 DIAGNOSIS — B964 Proteus (mirabilis) (morganii) as the cause of diseases classified elsewhere: Secondary | ICD-10-CM | POA: Diagnosis present

## 2024-02-11 DIAGNOSIS — E872 Acidosis, unspecified: Secondary | ICD-10-CM

## 2024-02-11 DIAGNOSIS — Z79899 Other long term (current) drug therapy: Secondary | ICD-10-CM | POA: Diagnosis not present

## 2024-02-11 DIAGNOSIS — E669 Obesity, unspecified: Secondary | ICD-10-CM | POA: Diagnosis not present

## 2024-02-11 DIAGNOSIS — I8500 Esophageal varices without bleeding: Secondary | ICD-10-CM | POA: Diagnosis not present

## 2024-02-11 DIAGNOSIS — F1020 Alcohol dependence, uncomplicated: Secondary | ICD-10-CM | POA: Diagnosis not present

## 2024-02-11 DIAGNOSIS — T8744 Infection of amputation stump, left lower extremity: Principal | ICD-10-CM | POA: Diagnosis present

## 2024-02-11 DIAGNOSIS — L089 Local infection of the skin and subcutaneous tissue, unspecified: Secondary | ICD-10-CM | POA: Diagnosis not present

## 2024-02-11 DIAGNOSIS — K219 Gastro-esophageal reflux disease without esophagitis: Secondary | ICD-10-CM | POA: Diagnosis present

## 2024-02-11 DIAGNOSIS — Z5982 Transportation insecurity: Secondary | ICD-10-CM

## 2024-02-11 DIAGNOSIS — Z5900 Homelessness unspecified: Secondary | ICD-10-CM | POA: Diagnosis not present

## 2024-02-11 DIAGNOSIS — Y835 Amputation of limb(s) as the cause of abnormal reaction of the patient, or of later complication, without mention of misadventure at the time of the procedure: Secondary | ICD-10-CM | POA: Diagnosis present

## 2024-02-11 DIAGNOSIS — R7402 Elevation of levels of lactic acid dehydrogenase (LDH): Secondary | ICD-10-CM | POA: Diagnosis not present

## 2024-02-11 DIAGNOSIS — I8510 Secondary esophageal varices without bleeding: Secondary | ICD-10-CM | POA: Diagnosis present

## 2024-02-11 DIAGNOSIS — M79605 Pain in left leg: Secondary | ICD-10-CM | POA: Diagnosis not present

## 2024-02-11 DIAGNOSIS — F419 Anxiety disorder, unspecified: Secondary | ICD-10-CM | POA: Diagnosis present

## 2024-02-11 DIAGNOSIS — M869 Osteomyelitis, unspecified: Principal | ICD-10-CM | POA: Diagnosis present

## 2024-02-11 DIAGNOSIS — K7030 Alcoholic cirrhosis of liver without ascites: Secondary | ICD-10-CM | POA: Diagnosis present

## 2024-02-11 DIAGNOSIS — E66811 Obesity, class 1: Secondary | ICD-10-CM | POA: Diagnosis present

## 2024-02-11 DIAGNOSIS — K766 Portal hypertension: Secondary | ICD-10-CM | POA: Diagnosis present

## 2024-02-11 DIAGNOSIS — F1090 Alcohol use, unspecified, uncomplicated: Secondary | ICD-10-CM | POA: Diagnosis present

## 2024-02-11 DIAGNOSIS — Z5986 Financial insecurity: Secondary | ICD-10-CM | POA: Diagnosis not present

## 2024-02-11 DIAGNOSIS — T148XXA Other injury of unspecified body region, initial encounter: Secondary | ICD-10-CM | POA: Diagnosis not present

## 2024-02-11 DIAGNOSIS — D61818 Other pancytopenia: Secondary | ICD-10-CM | POA: Diagnosis not present

## 2024-02-11 DIAGNOSIS — E1169 Type 2 diabetes mellitus with other specified complication: Secondary | ICD-10-CM | POA: Diagnosis present

## 2024-02-11 DIAGNOSIS — D696 Thrombocytopenia, unspecified: Secondary | ICD-10-CM | POA: Diagnosis not present

## 2024-02-11 DIAGNOSIS — R7881 Bacteremia: Secondary | ICD-10-CM | POA: Diagnosis not present

## 2024-02-11 DIAGNOSIS — D509 Iron deficiency anemia, unspecified: Secondary | ICD-10-CM | POA: Diagnosis present

## 2024-02-11 DIAGNOSIS — M86072 Acute hematogenous osteomyelitis, left ankle and foot: Secondary | ICD-10-CM | POA: Diagnosis not present

## 2024-02-11 DIAGNOSIS — E876 Hypokalemia: Secondary | ICD-10-CM | POA: Diagnosis present

## 2024-02-11 DIAGNOSIS — Z89432 Acquired absence of left foot: Secondary | ICD-10-CM | POA: Diagnosis not present

## 2024-02-11 DIAGNOSIS — M8610 Other acute osteomyelitis, unspecified site: Secondary | ICD-10-CM | POA: Diagnosis not present

## 2024-02-11 DIAGNOSIS — F17210 Nicotine dependence, cigarettes, uncomplicated: Secondary | ICD-10-CM | POA: Diagnosis present

## 2024-02-11 DIAGNOSIS — Z801 Family history of malignant neoplasm of trachea, bronchus and lung: Secondary | ICD-10-CM | POA: Diagnosis not present

## 2024-02-11 DIAGNOSIS — I10 Essential (primary) hypertension: Secondary | ICD-10-CM | POA: Diagnosis present

## 2024-02-11 DIAGNOSIS — Z5941 Food insecurity: Secondary | ICD-10-CM | POA: Diagnosis not present

## 2024-02-11 DIAGNOSIS — E870 Hyperosmolality and hypernatremia: Secondary | ICD-10-CM | POA: Insufficient documentation

## 2024-02-11 DIAGNOSIS — M86172 Other acute osteomyelitis, left ankle and foot: Secondary | ICD-10-CM | POA: Diagnosis not present

## 2024-02-11 DIAGNOSIS — R531 Weakness: Secondary | ICD-10-CM | POA: Diagnosis present

## 2024-02-11 DIAGNOSIS — Z792 Long term (current) use of antibiotics: Secondary | ICD-10-CM | POA: Diagnosis not present

## 2024-02-11 DIAGNOSIS — Z6833 Body mass index (BMI) 33.0-33.9, adult: Secondary | ICD-10-CM | POA: Diagnosis not present

## 2024-02-11 DIAGNOSIS — G546 Phantom limb syndrome with pain: Secondary | ICD-10-CM | POA: Diagnosis present

## 2024-02-11 DIAGNOSIS — T8781 Dehiscence of amputation stump: Secondary | ICD-10-CM | POA: Diagnosis present

## 2024-02-11 DIAGNOSIS — D649 Anemia, unspecified: Secondary | ICD-10-CM | POA: Diagnosis present

## 2024-02-11 DIAGNOSIS — B1920 Unspecified viral hepatitis C without hepatic coma: Secondary | ICD-10-CM | POA: Diagnosis present

## 2024-02-11 LAB — CBC WITH DIFFERENTIAL/PLATELET
Abs Immature Granulocytes: 0.03 10*3/uL (ref 0.00–0.07)
Basophils Absolute: 0 10*3/uL (ref 0.0–0.1)
Basophils Relative: 1 %
Eosinophils Absolute: 0 10*3/uL (ref 0.0–0.5)
Eosinophils Relative: 0 %
HCT: 26.2 % — ABNORMAL LOW (ref 39.0–52.0)
Hemoglobin: 8.3 g/dL — ABNORMAL LOW (ref 13.0–17.0)
Immature Granulocytes: 1 %
Lymphocytes Relative: 13 %
Lymphs Abs: 0.8 10*3/uL (ref 0.7–4.0)
MCH: 26.2 pg (ref 26.0–34.0)
MCHC: 31.7 g/dL (ref 30.0–36.0)
MCV: 82.6 fL (ref 80.0–100.0)
Monocytes Absolute: 0.6 10*3/uL (ref 0.1–1.0)
Monocytes Relative: 10 %
Neutro Abs: 4.5 10*3/uL (ref 1.7–7.7)
Neutrophils Relative %: 75 %
Platelets: 60 10*3/uL — ABNORMAL LOW (ref 150–400)
RBC: 3.17 MIL/uL — ABNORMAL LOW (ref 4.22–5.81)
RDW: 20.7 % — ABNORMAL HIGH (ref 11.5–15.5)
WBC: 5.9 10*3/uL (ref 4.0–10.5)
nRBC: 0 % (ref 0.0–0.2)

## 2024-02-11 LAB — COMPREHENSIVE METABOLIC PANEL WITH GFR
ALT: 19 U/L (ref 0–44)
AST: 43 U/L — ABNORMAL HIGH (ref 15–41)
Albumin: 3.2 g/dL — ABNORMAL LOW (ref 3.5–5.0)
Alkaline Phosphatase: 73 U/L (ref 38–126)
Anion gap: 10 (ref 5–15)
BUN: 7 mg/dL (ref 6–20)
CO2: 20 mmol/L — ABNORMAL LOW (ref 22–32)
Calcium: 8.6 mg/dL — ABNORMAL LOW (ref 8.9–10.3)
Chloride: 114 mmol/L — ABNORMAL HIGH (ref 98–111)
Creatinine, Ser: 0.57 mg/dL — ABNORMAL LOW (ref 0.61–1.24)
GFR, Estimated: >60 mL/min (ref >60)
Glucose, Bld: 119 mg/dL — ABNORMAL HIGH (ref 70–99)
Potassium: 3.6 mmol/L (ref 3.5–5.1)
Sodium: 144 mmol/L (ref 135–145)
Total Bilirubin: 1.6 mg/dL — ABNORMAL HIGH (ref 0.0–1.2)
Total Protein: 6.6 g/dL (ref 6.5–8.1)

## 2024-02-11 LAB — LACTIC ACID, PLASMA: Lactic Acid, Venous: 2.2 mmol/L (ref 0.5–1.9)

## 2024-02-11 MED ORDER — ACETAMINOPHEN 650 MG RE SUPP: 650.0000 mg | Freq: Four times a day (QID) | RECTAL | Status: DC | PRN

## 2024-02-11 MED ORDER — METRONIDAZOLE 500 MG/100ML IV SOLN: 500.0000 mg | Freq: Once | INTRAVENOUS | Status: DC

## 2024-02-11 MED ORDER — THIAMINE MONONITRATE 100 MG PO TABS
100.0000 mg | ORAL_TABLET | Freq: Every day | ORAL | Status: DC
  Administered 2024-02-13 – 2024-02-23 (×11): 100 mg via ORAL
  Filled 2024-02-11 (×11): qty 1

## 2024-02-11 MED ORDER — HYDROCODONE-ACETAMINOPHEN 5-325 MG PO TABS
1.0000 | ORAL_TABLET | Freq: Once | ORAL | Status: AC
  Administered 2024-02-11: 1 via ORAL
  Filled 2024-02-11: qty 1

## 2024-02-11 MED ORDER — SODIUM CHLORIDE 0.9 % IV BOLUS
500.0000 mL | Freq: Once | INTRAVENOUS | Status: AC
Start: 2024-02-11 — End: 2024-02-12
  Administered 2024-02-11: 500 mL via INTRAVENOUS

## 2024-02-11 MED ORDER — PIPERACILLIN-TAZOBACTAM 3.375 G IVPB
3.3750 g | Freq: Once | INTRAVENOUS | Status: AC
  Administered 2024-02-11: 3.375 g via INTRAVENOUS
  Filled 2024-02-11: qty 50

## 2024-02-11 MED ORDER — SODIUM CHLORIDE 0.9 % IV SOLN
10.0000 mg/kg | Freq: Once | INTRAVENOUS | Status: AC
  Administered 2024-02-11: 800 mg via INTRAVENOUS
  Filled 2024-02-11: qty 16

## 2024-02-11 MED ORDER — LORAZEPAM 2 MG/ML IJ SOLN
1.0000 mg | INTRAMUSCULAR | Status: AC | PRN
  Administered 2024-02-12: 1 mg via INTRAVENOUS
  Filled 2024-02-11: qty 1

## 2024-02-11 MED ORDER — ADULT MULTIVITAMIN W/MINERALS CH: 1.0000 | ORAL_TABLET | Freq: Every day | ORAL | Status: DC

## 2024-02-11 MED ORDER — ONDANSETRON HCL 4 MG PO TABS
4.0000 mg | ORAL_TABLET | Freq: Four times a day (QID) | ORAL | Status: DC | PRN
Start: 2024-02-11 — End: 2024-02-23

## 2024-02-11 MED ORDER — FOLIC ACID 1 MG PO TABS: 1.0000 mg | ORAL_TABLET | Freq: Every day | ORAL | Status: DC

## 2024-02-11 MED ORDER — LACTULOSE 10 GM/15ML PO SOLN
20.0000 g | Freq: Every day | ORAL | Status: DC
  Filled 2024-02-11 (×8): qty 30

## 2024-02-11 MED ORDER — LORAZEPAM 1 MG PO TABS
1.0000 mg | ORAL_TABLET | ORAL | Status: AC | PRN
  Administered 2024-02-13 – 2024-02-14 (×3): 1 mg via ORAL
  Filled 2024-02-11 (×3): qty 1

## 2024-02-11 MED ORDER — PANTOPRAZOLE SODIUM 40 MG PO TBEC
40.0000 mg | DELAYED_RELEASE_TABLET | Freq: Every day | ORAL | Status: DC
  Administered 2024-02-13 – 2024-02-23 (×11): 40 mg via ORAL
  Filled 2024-02-11 (×11): qty 1

## 2024-02-11 MED ORDER — ONDANSETRON HCL 4 MG/2ML IJ SOLN
4.0000 mg | Freq: Four times a day (QID) | INTRAMUSCULAR | Status: DC | PRN
  Administered 2024-02-12 – 2024-02-19 (×2): 4 mg via INTRAVENOUS
  Filled 2024-02-11: qty 2

## 2024-02-11 MED ORDER — THIAMINE MONONITRATE 100 MG PO TABS: 100.0000 mg | ORAL_TABLET | Freq: Every day | ORAL | Status: DC

## 2024-02-11 MED ORDER — ACETAMINOPHEN 325 MG PO TABS
650.0000 mg | ORAL_TABLET | Freq: Four times a day (QID) | ORAL | Status: DC | PRN
Start: 2024-02-11 — End: 2024-02-23
  Administered 2024-02-19: 650 mg via ORAL
  Filled 2024-02-11: qty 2

## 2024-02-11 MED ORDER — SODIUM CHLORIDE 0.9 % IV SOLN: 2.0000 g | Freq: Once | INTRAVENOUS | Status: DC

## 2024-02-11 MED ORDER — FOLIC ACID 1 MG PO TABS
1.0000 mg | ORAL_TABLET | Freq: Every day | ORAL | Status: DC
  Administered 2024-02-13 – 2024-02-23 (×11): 1 mg via ORAL
  Filled 2024-02-11 (×11): qty 1

## 2024-02-11 MED ORDER — KETOROLAC TROMETHAMINE 30 MG/ML IJ SOLN
30.0000 mg | Freq: Four times a day (QID) | INTRAMUSCULAR | Status: DC | PRN
  Administered 2024-02-12 (×3): 30 mg via INTRAVENOUS
  Filled 2024-02-11 (×2): qty 1

## 2024-02-11 MED ORDER — THIAMINE HCL 100 MG/ML IJ SOLN
100.0000 mg | Freq: Every day | INTRAMUSCULAR | Status: DC
  Filled 2024-02-11 (×2): qty 2

## 2024-02-11 MED ORDER — OXYCODONE HCL 5 MG PO TABS
10.0000 mg | ORAL_TABLET | ORAL | Status: DC | PRN
  Administered 2024-02-12: 20 mg via ORAL
  Filled 2024-02-11: qty 4

## 2024-02-11 MED ORDER — ENOXAPARIN SODIUM 60 MG/0.6ML IJ SOSY: 0.5000 mg/kg | PREFILLED_SYRINGE | INTRAMUSCULAR | Status: DC

## 2024-02-11 MED ORDER — VANCOMYCIN HCL IN DEXTROSE 1-5 GM/200ML-% IV SOLN: 1000.0000 mg | Freq: Once | INTRAVENOUS | Status: DC

## 2024-02-11 MED ORDER — ADULT MULTIVITAMIN W/MINERALS CH
1.0000 | ORAL_TABLET | Freq: Every day | ORAL | Status: DC
  Administered 2024-02-13 – 2024-02-23 (×11): 1 via ORAL
  Filled 2024-02-11 (×11): qty 1

## 2024-02-11 NOTE — ED Triage Notes (Addendum)
 BIB [facility] from side of the road. Pt C of left foot and they are bleeding. HX of cirrhosis. Pt also defecated and urinated on himself.   102/62 97 HR  98%  ETOH on board  Pt is homeless as well. Pt admits to drinking ETOH today. Pt had his surgery at [facility]. Pt has fowl smell coming from his foot.

## 2024-02-11 NOTE — ED Notes (Signed)
 Pt given a sandwich before going up stairs.

## 2024-02-11 NOTE — ED Notes (Signed)
 Date and time results received: 02/11/24 2107 (use smartphrase ".now" to insert current time)  Test: lactic acid Critical Value: 2.2  Name of Provider Notified: Laine Piggs, MD

## 2024-02-11 NOTE — Progress Notes (Signed)
 Anticoagulation monitoring(Lovenox ):  43 yo male ordered Lovenox  40 mg Q24h    Filed Weights   02/11/24 2117  Weight: 101.3 kg (223 lb 5.2 oz)   BMI 35   Lab Results  Component Value Date   CREATININE 0.57 (L) 02/11/2024   CREATININE 0.51 (L) 01/23/2024   CREATININE 0.57 (L) 01/22/2024   Estimated Creatinine Clearance: 139.2 mL/min (A) (by C-G formula based on SCr of 0.57 mg/dL (L)). Hemoglobin & Hematocrit     Component Value Date/Time   HGB 8.3 (L) 02/11/2024 1822   HGB 12.1 (L) 09/20/2013 1624   HCT 26.2 (L) 02/11/2024 1822   HCT 36.8 (L) 09/20/2013 1624     Per Protocol for Patient with estCrcl > 30 ml/min and BMI > 30, will transition to Lovenox  50 mg Q24h.

## 2024-02-11 NOTE — ED Provider Notes (Signed)
 [facility] Provider Note    Event Date/Time   First MD Initiated Contact with Patient 02/11/24 1806     (approximate)   History   Wound Check and Alcohol Intoxication   HPI  CORWYN VORA is a 43 year old male with history of alcoholic cirrhosis, hypertension, osteomyelitis DM, osteomyelitis s/p recent transmetatarsal amputation and revision presenting to the emergency department for evaluation of ongoing foot pain.  Patient reports that his splint got wet the day after he was discharged and it came off.  That he says he has had ongoing bleeding and pain in his foot leading him to present to the ER.  No fevers.  He reports that when he was last seen in our hospital is recommended that he have a BKA, was not amenable at that time, but reports that he is now open to any further interventions to help with his pain.  I reviewed his podiatry consultation during admission from 5/13.  At that time patient was found to have dehiscence of his right foot transmetatarsal amputation.  At that time, podiatry recommended healing via secondary intention and noted that proximal limb amputation may be necessary if there is further dehiscence or bleeding.  During admission, patient was noted to have worsening dehiscence with recommendation for BKA and consultation to vascular surgery.  Patient reported that he wished to present to [facility] for further evaluation.    On review of his outside records it does appear that he underwent a TMA revision on 01/26/2024 in the [facility] system.  He was placed in a splint and discharged on linezolid  and Augmentin  for 14 days with planned end date today.     Physical Exam   Triage Vital Signs: ED Triage Vitals  Encounter Vitals Group     BP 02/11/24 1808 112/62     Systolic BP Percentile --      Diastolic BP Percentile --      Pulse Rate 02/11/24 1808 98     Resp 02/11/24 1808 16     Temp 02/11/24 1808 99 F (37.2 C)     Temp Source  02/11/24 1808 Oral     SpO2 02/11/24 1808 95 %     Weight --      Height 02/11/24 1809 5\' 7"  (1.702 m)     Head Circumference --      Peak Flow --      Pain Score 02/11/24 1808 0     Pain Loc --      Pain Education --      Exclude from Growth Chart --     Most recent vital signs: Vitals:   02/11/24 2100 02/11/24 2231  BP: 111/68   Pulse: 68   Resp: (!) 22   Temp:  98.6 F (37 C)  SpO2: 100%      General: Awake, interactive  CV:  Regular rate, good peripheral perfusion.  Resp:  Unlabored respirations.  Abd:  Nondistended.  Neuro:  Symmetric facial movement, fluid speech MSK:  There is a large dressing in place over the left lower extremity.  There is what I suspect is a combination of Betadine and blood saturating aspects of the gauze.  When it is taken down, there is erythema over the foot.  There are some sutures in place.  No necrotic tissue noted.  2+ DP pulse.  See image below.     ED Results / Procedures / Treatments   Labs (all labs ordered are listed, but only  abnormal results are displayed) Labs Reviewed  LACTIC ACID, PLASMA - Abnormal; Notable for the following components:      Result Value   Lactic Acid, Venous 2.2 (*)    All other components within normal limits  COMPREHENSIVE METABOLIC PANEL WITH GFR - Abnormal; Notable for the following components:   Chloride 114 (*)    CO2 20 (*)    Glucose, Bld 119 (*)    Creatinine, Ser 0.57 (*)    Calcium  8.6 (*)    Albumin 3.2 (*)    AST 43 (*)    Total Bilirubin 1.6 (*)    All other components within normal limits  CBC WITH DIFFERENTIAL/PLATELET - Abnormal; Notable for the following components:   RBC 3.17 (*)    Hemoglobin 8.3 (*)    HCT 26.2 (*)    RDW 20.7 (*)    Platelets 60 (*)    All other components within normal limits  CULTURE, BLOOD (ROUTINE X 2)  CULTURE, BLOOD (ROUTINE X 2)  LACTIC ACID, PLASMA     EKG EKG independently reviewed and interpreted by myself demonstrates:     RADIOLOGY Imaging independently reviewed and interpreted by myself demonstrates:  Foot x-Emi Lymon with interventional surgery, radiology notes possible findings suggestive of osteomyelitis  Formal Radiology Read:  DG Foot Complete Left Result Date: 02/11/2024 CLINICAL DATA:  Persistent pain following transmetatarsal amputation EXAM: LEFT FOOT - COMPLETE 3+ VIEW COMPARISON:  01/06/2024 FINDINGS: There has been interval progressive removal of the metatarsal bases with small bony fragments identified. The margins appear irregular suspicious for underlying osteomyelitis. Soft tissue swelling the stump is noted. IMPRESSION: Interval progressive resection of the metatarsal bases. The margins appear mildly irregular suspicious for osteomyelitis. Electronically Signed   By: Violeta Grey M.D.   On: 02/11/2024 19:57    PROCEDURES:  Critical Care performed: No  Procedures   MEDICATIONS ORDERED IN ED: Medications  DAPTOmycin (CUBICIN) 800 mg in sodium chloride  0.9 % IVPB (800 mg Intravenous New Bag/Given 02/11/24 2227)  piperacillin-tazobactam (ZOSYN) IVPB 3.375 g (0 g Intravenous Stopped 02/11/24 2118)  HYDROcodone -acetaminophen  (NORCO/VICODIN) 5-325 MG per tablet 1 tablet (1 tablet Oral Given 02/11/24 2040)  sodium chloride  0.9 % bolus 500 mL (500 mLs Intravenous New Bag/Given 02/11/24 2223)     IMPRESSION / MDM / ASSESSMENT AND PLAN / ED COURSE  I reviewed the triage vital signs and the nursing notes.  Differential diagnosis includes, but is not limited to, progressive osteomyelitis of his foot, postoperative infection, no evidence of acute arterial occlusion, postoperative pain without complication  Patient's presentation is most consistent with acute presentation with potential threat to life or bodily function.  43 year old male presenting with ongoing pain of his left foot with recent history of osteomyelitis.  On exam, patient has erythema of his left foot.  Sutures in place not obviously  dehisced.  X-rays concerning for possible ongoing osteomyelitis.  Case reviewed with Dr. Luster Salters with podiatry.  He reviewed image of patient's foot.  He does note that with his clinical history he would recommend admission for vascular surgery consultation and below the knee amputation.  If patient is adamantly opposed to this, he recommends discharge with planned follow-up with his podiatrist at [facility] later this month.  I discussed these options with the patient.  He reports he is now agreeable to any further intervention including a BKA.  With this, labs were obtained with normal white blood cell count, stable anemia.  CMP without critical derangements.  Lactate mildly elevated.  With  history of cirrhosis will provide small fluid bolus.  I reviewed notes from [facility], was on Zosyn and daptomycin based on prior culture data.  Discussed with our pharmacy team, they do recommend resuming this.  Will reach out to hospitalist team to discuss admission.  2238 Case discussed with Dr. Vallarie Gauze.  She will evaluate for anticipated admission.      FINAL CLINICAL IMPRESSION(S) / ED DIAGNOSES   Final diagnoses:  Osteomyelitis of left foot, unspecified type (HCC)     Rx / DC Orders   ED Discharge Orders     None        Note:  This document was prepared using Dragon voice recognition software and may include unintentional dictation errors.   Claria Crofts, MD 02/11/24 2239

## 2024-02-11 NOTE — ED Notes (Signed)
 Pt was actively smoking a cigarette in ED 9. Cigarettes were voluntarily given to security. Security placed cigarettes in the locker.

## 2024-02-11 NOTE — Hospital Course (Signed)
 Derrick Blanchard

## 2024-02-11 NOTE — ED Notes (Signed)
 MD at bedside.

## 2024-02-12 ENCOUNTER — Encounter: Payer: Self-pay | Admitting: Internal Medicine

## 2024-02-12 ENCOUNTER — Inpatient Hospital Stay: Admitting: Certified Registered Nurse Anesthetist

## 2024-02-12 ENCOUNTER — Encounter
Admission: EM | Disposition: A | Discharge status: Skilled Nursing Facility | Payer: Self-pay | Source: Home / Self Care | Attending: Internal Medicine

## 2024-02-12 ENCOUNTER — Other Ambulatory Visit: Payer: Self-pay

## 2024-02-12 DIAGNOSIS — E870 Hyperosmolality and hypernatremia: Secondary | ICD-10-CM | POA: Insufficient documentation

## 2024-02-12 DIAGNOSIS — D649 Anemia, unspecified: Secondary | ICD-10-CM

## 2024-02-12 DIAGNOSIS — R7881 Bacteremia: Secondary | ICD-10-CM | POA: Diagnosis not present

## 2024-02-12 DIAGNOSIS — K7030 Alcoholic cirrhosis of liver without ascites: Secondary | ICD-10-CM

## 2024-02-12 DIAGNOSIS — D696 Thrombocytopenia, unspecified: Secondary | ICD-10-CM

## 2024-02-12 DIAGNOSIS — T148XXA Other injury of unspecified body region, initial encounter: Secondary | ICD-10-CM

## 2024-02-12 DIAGNOSIS — Z89432 Acquired absence of left foot: Secondary | ICD-10-CM

## 2024-02-12 DIAGNOSIS — Z79899 Other long term (current) drug therapy: Secondary | ICD-10-CM

## 2024-02-12 DIAGNOSIS — M86072 Acute hematogenous osteomyelitis, left ankle and foot: Secondary | ICD-10-CM

## 2024-02-12 DIAGNOSIS — R7402 Elevation of levels of lactic acid dehydrogenase (LDH): Secondary | ICD-10-CM

## 2024-02-12 DIAGNOSIS — F17210 Nicotine dependence, cigarettes, uncomplicated: Secondary | ICD-10-CM | POA: Diagnosis not present

## 2024-02-12 DIAGNOSIS — E876 Hypokalemia: Secondary | ICD-10-CM

## 2024-02-12 DIAGNOSIS — L089 Local infection of the skin and subcutaneous tissue, unspecified: Secondary | ICD-10-CM | POA: Diagnosis not present

## 2024-02-12 DIAGNOSIS — E872 Acidosis, unspecified: Secondary | ICD-10-CM

## 2024-02-12 DIAGNOSIS — B182 Chronic viral hepatitis C: Secondary | ICD-10-CM

## 2024-02-12 DIAGNOSIS — I8500 Esophageal varices without bleeding: Secondary | ICD-10-CM

## 2024-02-12 DIAGNOSIS — M79605 Pain in left leg: Secondary | ICD-10-CM

## 2024-02-12 DIAGNOSIS — T8744 Infection of amputation stump, left lower extremity: Principal | ICD-10-CM

## 2024-02-12 DIAGNOSIS — Z792 Long term (current) use of antibiotics: Secondary | ICD-10-CM

## 2024-02-12 DIAGNOSIS — K766 Portal hypertension: Secondary | ICD-10-CM

## 2024-02-12 HISTORY — PX: AMPUTATION: SHX166

## 2024-02-12 LAB — CBC
HCT: 25.7 % — ABNORMAL LOW (ref 39.0–52.0)
HCT: 29.2 % — ABNORMAL LOW (ref 39.0–52.0)
Hemoglobin: 8 g/dL — ABNORMAL LOW (ref 13.0–17.0)
Hemoglobin: 9 g/dL — ABNORMAL LOW (ref 13.0–17.0)
MCH: 26.1 pg (ref 26.0–34.0)
MCH: 25.9 pg — ABNORMAL LOW (ref 26.0–34.0)
MCHC: 31.1 g/dL (ref 30.0–36.0)
MCHC: 30.8 g/dL (ref 30.0–36.0)
MCV: 83.7 fL (ref 80.0–100.0)
MCV: 83.9 fL (ref 80.0–100.0)
Platelets: 60 10*3/uL — ABNORMAL LOW (ref 150–400)
Platelets: 114 10*3/uL — ABNORMAL LOW (ref 150–400)
RBC: 3.07 MIL/uL — ABNORMAL LOW (ref 4.22–5.81)
RBC: 3.48 MIL/uL — ABNORMAL LOW (ref 4.22–5.81)
RDW: 20.6 % — ABNORMAL HIGH (ref 11.5–15.5)
RDW: 19.1 % — ABNORMAL HIGH (ref 11.5–15.5)
WBC: 5.1 10*3/uL (ref 4.0–10.5)
WBC: 9.5 10*3/uL (ref 4.0–10.5)
nRBC: 0 % (ref 0.0–0.2)
nRBC: 0 % (ref 0.0–0.2)

## 2024-02-12 LAB — LACTIC ACID, PLASMA: Lactic Acid, Venous: 3.2 mmol/L (ref 0.5–1.9)

## 2024-02-12 LAB — BASIC METABOLIC PANEL WITH GFR
Anion gap: 7 (ref 5–15)
BUN: 7 mg/dL (ref 6–20)
CO2: 22 mmol/L (ref 22–32)
Calcium: 8.5 mg/dL — ABNORMAL LOW (ref 8.9–10.3)
Chloride: 117 mmol/L — ABNORMAL HIGH (ref 98–111)
Creatinine, Ser: 0.62 mg/dL (ref 0.61–1.24)
GFR, Estimated: >60 mL/min (ref >60)
Glucose, Bld: 122 mg/dL — ABNORMAL HIGH (ref 70–99)
Potassium: 3.3 mmol/L — ABNORMAL LOW (ref 3.5–5.1)
Sodium: 146 mmol/L — ABNORMAL HIGH (ref 135–145)

## 2024-02-12 LAB — PREPARE RBC (CROSSMATCH)

## 2024-02-12 SURGERY — AMPUTATION BELOW KNEE
Anesthesia: General | Site: Knee | Laterality: Left

## 2024-02-12 MED ORDER — PHENYLEPHRINE 80 MCG/ML (10ML) SYRINGE FOR IV PUSH (FOR BLOOD PRESSURE SUPPORT)
PREFILLED_SYRINGE | INTRAVENOUS | Status: DC | PRN
  Administered 2024-02-12 (×2): 160 ug via INTRAVENOUS

## 2024-02-12 MED ORDER — CHLORHEXIDINE GLUCONATE 4 % EX SOLN: 60.0000 mL | Freq: Once | CUTANEOUS | Status: DC

## 2024-02-12 MED ORDER — SODIUM CHLORIDE 0.9 % IV SOLN: INTRAVENOUS | Status: DC

## 2024-02-12 MED ORDER — PREGABALIN 75 MG PO CAPS
75.0000 mg | ORAL_CAPSULE | Freq: Two times a day (BID) | ORAL | Status: DC
  Administered 2024-02-12 – 2024-02-15 (×6): 75 mg via ORAL
  Filled 2024-02-12 (×6): qty 1

## 2024-02-12 MED ORDER — TRANEXAMIC ACID-NACL 1000-0.7 MG/100ML-% IV SOLN
INTRAVENOUS | Status: DC | PRN
  Administered 2024-02-12: 1000 mg via INTRAVENOUS

## 2024-02-12 MED ORDER — KETOROLAC TROMETHAMINE 30 MG/ML IJ SOLN
INTRAMUSCULAR | Status: AC
  Filled 2024-02-12: qty 1

## 2024-02-12 MED ORDER — TRANEXAMIC ACID-NACL 1000-0.7 MG/100ML-% IV SOLN
INTRAVENOUS | Status: AC
Start: 2024-02-12 — End: ?
  Filled 2024-02-12: qty 100

## 2024-02-12 MED ORDER — OXYCODONE HCL 5 MG PO TABS: 10.0000 mg | ORAL_TABLET | ORAL | Status: DC | PRN

## 2024-02-12 MED ORDER — FENTANYL CITRATE (PF) 100 MCG/2ML IJ SOLN
INTRAMUSCULAR | Status: DC | PRN
  Administered 2024-02-12 (×4): 50 ug via INTRAVENOUS

## 2024-02-12 MED ORDER — HYDROMORPHONE HCL 1 MG/ML IJ SOLN
INTRAMUSCULAR | Status: AC
  Filled 2024-02-12: qty 1

## 2024-02-12 MED ORDER — ONDANSETRON HCL 4 MG/2ML IJ SOLN
INTRAMUSCULAR | Status: DC | PRN
  Administered 2024-02-12: 4 mg via INTRAVENOUS

## 2024-02-12 MED ORDER — MIDAZOLAM HCL 2 MG/2ML IJ SOLN
INTRAMUSCULAR | Status: AC
  Filled 2024-02-12: qty 2

## 2024-02-12 MED ORDER — KETAMINE HCL 50 MG/5ML IJ SOSY
PREFILLED_SYRINGE | INTRAMUSCULAR | Status: DC | PRN
  Administered 2024-02-12 (×2): 10 mg via INTRAVENOUS
  Administered 2024-02-12: 20 mg via INTRAVENOUS
  Administered 2024-02-12: 10 mg via INTRAVENOUS

## 2024-02-12 MED ORDER — DEXMEDETOMIDINE HCL IN NACL 80 MCG/20ML IV SOLN
INTRAVENOUS | Status: DC | PRN
  Administered 2024-02-12: 8 ug via INTRAVENOUS

## 2024-02-12 MED ORDER — LIDOCAINE HCL (CARDIAC) PF 100 MG/5ML IV SOSY
PREFILLED_SYRINGE | INTRAVENOUS | Status: DC | PRN
  Administered 2024-02-12: 100 mg via INTRAVENOUS

## 2024-02-12 MED ORDER — HYDROMORPHONE HCL 1 MG/ML IJ SOLN
0.2500 mg | INTRAMUSCULAR | Status: DC | PRN
  Administered 2024-02-12 (×4): 0.5 mg via INTRAVENOUS

## 2024-02-12 MED ORDER — PIPERACILLIN-TAZOBACTAM 3.375 G IVPB
3.3750 g | Freq: Three times a day (TID) | INTRAVENOUS | Status: DC
  Administered 2024-02-12 – 2024-02-15 (×10): 3.375 g via INTRAVENOUS
  Filled 2024-02-12 (×11): qty 50

## 2024-02-12 MED ORDER — ROCURONIUM BROMIDE 100 MG/10ML IV SOLN
INTRAVENOUS | Status: DC | PRN
  Administered 2024-02-12: 60 mg via INTRAVENOUS
  Administered 2024-02-12 (×2): 20 mg via INTRAVENOUS

## 2024-02-12 MED ORDER — POTASSIUM CHLORIDE CRYS ER 20 MEQ PO TBCR: 40.0000 meq | EXTENDED_RELEASE_TABLET | Freq: Once | ORAL | Status: DC

## 2024-02-12 MED ORDER — DEXAMETHASONE SODIUM PHOSPHATE 10 MG/ML IJ SOLN
INTRAMUSCULAR | Status: DC | PRN
  Administered 2024-02-12: 10 mg via INTRAVENOUS

## 2024-02-12 MED ORDER — SODIUM CHLORIDE 0.9 % IV BOLUS
1000.0000 mL | Freq: Once | INTRAVENOUS | Status: AC
  Administered 2024-02-12: 1000 mL via INTRAVENOUS

## 2024-02-12 MED ORDER — PROPOFOL 10 MG/ML IV BOLUS
INTRAVENOUS | Status: DC | PRN
  Administered 2024-02-12: 200 mg via INTRAVENOUS

## 2024-02-12 MED ORDER — OXYCODONE HCL 5 MG/5ML PO SOLN: 5.0000 mg | Freq: Once | ORAL | Status: AC | PRN

## 2024-02-12 MED ORDER — OXYCODONE HCL 5 MG PO TABS
10.0000 mg | ORAL_TABLET | ORAL | Status: DC | PRN
  Administered 2024-02-12 – 2024-02-13 (×4): 20 mg via ORAL
  Filled 2024-02-12 (×4): qty 4

## 2024-02-12 MED ORDER — VASHE WOUND IRRIGATION OPTIME
TOPICAL | Status: DC | PRN
  Administered 2024-02-12: 34 [oz_av]

## 2024-02-12 MED ORDER — SUGAMMADEX SODIUM 200 MG/2ML IV SOLN
INTRAVENOUS | Status: DC | PRN
  Administered 2024-02-12: 200 mg via INTRAVENOUS

## 2024-02-12 MED ORDER — OXYCODONE HCL 5 MG PO TABS
ORAL_TABLET | ORAL | Status: AC
  Filled 2024-02-12: qty 1

## 2024-02-12 MED ORDER — ONDANSETRON HCL 4 MG/2ML IJ SOLN
INTRAMUSCULAR | Status: AC
  Filled 2024-02-12: qty 2

## 2024-02-12 MED ORDER — MIDAZOLAM HCL 2 MG/2ML IJ SOLN
INTRAMUSCULAR | Status: DC | PRN
  Administered 2024-02-12: 2 mg via INTRAVENOUS

## 2024-02-12 MED ORDER — CEFAZOLIN SODIUM 1 G IJ SOLR
INTRAMUSCULAR | Status: AC
  Filled 2024-02-12: qty 20

## 2024-02-12 MED ORDER — SODIUM CHLORIDE 0.9% IV SOLUTION: Freq: Once | INTRAVENOUS | Status: DC

## 2024-02-12 MED ORDER — FENTANYL CITRATE (PF) 100 MCG/2ML IJ SOLN
INTRAMUSCULAR | Status: AC
  Filled 2024-02-12: qty 2

## 2024-02-12 MED ORDER — CEFAZOLIN SODIUM-DEXTROSE 2-4 GM/100ML-% IV SOLN
2.0000 g | INTRAVENOUS | Status: AC
  Administered 2024-02-12: 2 g via INTRAVENOUS

## 2024-02-12 MED ORDER — FENTANYL CITRATE (PF) 100 MCG/2ML IJ SOLN
INTRAMUSCULAR | Status: AC
Start: 2024-02-12 — End: ?
  Filled 2024-02-12: qty 2

## 2024-02-12 MED ORDER — CHLORHEXIDINE GLUCONATE 4 % EX SOLN
60.0000 mL | Freq: Once | CUTANEOUS | Status: DC
Start: 2024-02-13 — End: 2024-02-12

## 2024-02-12 MED ORDER — KETAMINE HCL 50 MG/5ML IJ SOSY
PREFILLED_SYRINGE | INTRAMUSCULAR | Status: AC
Start: 2024-02-12 — End: ?
  Filled 2024-02-12: qty 5

## 2024-02-12 MED ORDER — EPHEDRINE SULFATE-NACL 50-0.9 MG/10ML-% IV SOSY
PREFILLED_SYRINGE | INTRAVENOUS | Status: DC | PRN
  Administered 2024-02-12: 5 mg via INTRAVENOUS

## 2024-02-12 MED ORDER — OXYCODONE HCL 5 MG PO TABS
5.0000 mg | ORAL_TABLET | Freq: Once | ORAL | Status: AC | PRN
  Administered 2024-02-12: 5 mg via ORAL

## 2024-02-12 SURGICAL SUPPLY — 35 items
BLADE SAGITTAL WIDE XTHICK NO (BLADE) IMPLANT
BLADE SAW SAG 25.4X90 (BLADE) ×1 IMPLANT
BNDG COHESIVE 4X5 TAN STRL LF (GAUZE/BANDAGES/DRESSINGS) ×1 IMPLANT
BNDG ELASTIC 6X5.8 VLCR NS LF (GAUZE/BANDAGES/DRESSINGS) ×1 IMPLANT
BNDG GAUZE DERMACEA FLUFF 4 (GAUZE/BANDAGES/DRESSINGS) ×2 IMPLANT
BRUSH SCRUB EZ 4% CHG (MISCELLANEOUS) ×1 IMPLANT
CHLORAPREP W/TINT 26 (MISCELLANEOUS) ×1 IMPLANT
DRAPE INCISE IOBAN 66X45 STRL (DRAPES) IMPLANT
ELECT CAUTERY BLADE 6.4 (BLADE) ×1 IMPLANT
ELECTRODE REM PT RTRN 9FT ADLT (ELECTROSURGICAL) ×1 IMPLANT
GAUZE XEROFORM 1X8 LF (GAUZE/BANDAGES/DRESSINGS) ×2 IMPLANT
GLOVE BIO SURGEON STRL SZ7 (GLOVE) ×2 IMPLANT
GOWN STRL REUS W/ TWL LRG LVL3 (GOWN DISPOSABLE) ×2 IMPLANT
GOWN STRL REUS W/TWL 2XL LVL3 (GOWN DISPOSABLE) ×1 IMPLANT
HANDLE YANKAUER SUCT BULB TIP (MISCELLANEOUS) ×1 IMPLANT
KIT TURNOVER KIT A (KITS) ×1 IMPLANT
LABEL OR SOLS (LABEL) ×1 IMPLANT
MANIFOLD NEPTUNE II (INSTRUMENTS) ×1 IMPLANT
MAT ABSORB FLUID 56X50 GRAY (MISCELLANEOUS) ×1 IMPLANT
NS IRRIG 1000ML POUR BTL (IV SOLUTION) ×1 IMPLANT
PACK EXTREMITY ARMC (MISCELLANEOUS) ×1 IMPLANT
PAD ABD DERMACEA PRESS 5X9 (GAUZE/BANDAGES/DRESSINGS) ×2 IMPLANT
PAD PREP OB/GYN DISP 24X41 (PERSONAL CARE ITEMS) ×1 IMPLANT
PENCIL SMOKE EVACUATOR (MISCELLANEOUS) ×1 IMPLANT
SPONGE T-LAP 18X18 ~~LOC~~+RFID (SPONGE) ×1 IMPLANT
STAPLER SKIN PROX 35W (STAPLE) ×1 IMPLANT
STOCKINETTE M/LG 89821 (MISCELLANEOUS) ×1 IMPLANT
SUT ETHILON 2 0 FSLX (SUTURE) IMPLANT
SUT SILK 2 0 SH (SUTURE) ×2 IMPLANT
SUT SILK 2-0 18XBRD TIE 12 (SUTURE) ×1 IMPLANT
SUT SILK 3-0 18XBRD TIE 12 (SUTURE) ×1 IMPLANT
SUT VIC AB 0 CT1 36 (SUTURE) ×2 IMPLANT
SUT VIC AB 2-0 CT1 (SUTURE) ×2 IMPLANT
TRAP FLUID SMOKE EVACUATOR (MISCELLANEOUS) ×1 IMPLANT
WATER STERILE IRR 500ML POUR (IV SOLUTION) ×1 IMPLANT

## 2024-02-12 NOTE — Progress Notes (Signed)
 Progress Note   Patient: Derrick Blanchard QMV:[id number] DOB: May 19, 1981 DOA: 02/11/2024     1 DOS: the patient was seen and examined on 02/12/2024   Brief hospital course: 43 y.o. male with medical history significant for Being admitted with pain and bleeding from a left TMA for which he recently underwent revision at [facility] on 5/23, discharged on 02/08/2024.  Triage note states that he was brought in by EMS from the side of the road with a complaint that the wound was bleeding.  Patient has a history of alcohol use disorder and housing insecurity, depression, metALD with cirrhosis, portal hypertension and esophageal varices, chronic hepatitis C,  prior right TMA.  He had left TMA 12/2023 which was complicated by dehiscence for which she was hospitalized at [facility] from 5/12 to 01/23/2024.  BKA was recommended however he declined opting to go to [facility] for an opinion.  He was admitted to [facility] the following day on 5/21 where he remained until 6/5.  Wound cultures grew Proteus and Corynebacterium and he completed linezolid  and Augmentin  today 02/11/2024. Patient states he is in a lot of pain and he is now willing to undergo the previously recommended BKA. In the ED, vitals were within normal limits.  WBC 5.9 with lactic acid 2.2.  Hemoglobin at baseline at 8.3 with baseline thrombocytopenia of 60.  LFTs also at baseline.  X-rays of the left foot suspicious for osteomyelitis. Patient started on Zosyn.  Admission requested   6/9.  Patient seen by vascular surgery and will go to operating room for amputation.   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  Recent OR culture with Proteus and corynebacterium.  Completed linezolid  and Augmentin .  Patient given Zosyn and daptomycin in the emergency room.  For operating room today with vascular surgery.  Lactic acidosis Continue to monitor.  Could be secondary to infection.  Received a fluid bolus.  Left leg pain Pain  control.  Hypokalemia Replace potassium  ALC (alcoholic liver cirrhosis) (HCC) History of portal hypertension and esophageal varices.  Alcohol use disorder Continue folate thiamine  and multivitamin.  Anemia, unspecified Continue to monitor blood counts especially postoperative.  Chronic hepatitis C (HCC) Follow-up as outpatient.  Hypernatremia Will put back on diet once surgery done.  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  Agreeable to amputation.  For operating room today.  Physical Exam: Vitals:   02/12/24 0400 02/12/24 0404 02/12/24 0801 02/12/24 1157  BP: 110/70 110/70 118/65 118/70  Pulse: 76 76 67 84  Resp: 20 18 16 16   Temp: 98.5 F (36.9 C) 98.5 F (36.9 C) 98.4 F (36.9 C) 98 F (36.7 C)  TempSrc: Oral  Oral Temporal  SpO2: 100% 100% 100% 99%  Weight:    96.2 kg  Height:    5\' 7"  (1.702 m)   Physical Exam HENT:     Head: Normocephalic.     Mouth/Throat:     Pharynx: No oropharyngeal exudate.  Eyes:     General: Lids are normal.     Conjunctiva/sclera: Conjunctivae normal.  Cardiovascular:     Rate and Rhythm: Normal rate and regular rhythm.     Heart sounds: Normal heart sounds, S1 normal and S2 normal.  Pulmonary:     Breath sounds: No decreased breath sounds, wheezing, rhonchi or rales.  Abdominal:     Palpations: Abdomen is soft.     Tenderness: There is  no abdominal tenderness.  Musculoskeletal:     Right lower leg: Swelling present.     Left lower leg: Swelling present.  Skin:    General: Skin is warm.     Comments: Chronic lower extremity skin discoloration.  Neurological:     Mental Status: He is alert and oriented to person, place, and time.     Data Reviewed: White blood count 5.1, hemoglobin 8.0, platelet count 60, creatinine 0.62, sodium 146, potassium 3.3   Disposition: Status is: Inpatient Remains inpatient appropriate because: Patient will have BKA  today  Planned Discharge Destination: Rehab    Time spent: 28 minutes  Author: Verla Glaze, MD 02/12/2024 1:49 PM  For on call review [email or web address].

## 2024-02-12 NOTE — Consult Note (Incomplete)
 Infectious Disease     Reason for Consult:Foot infection    Referring Physician: Dr Tyler Gallant Date of Admission:  02/11/2024   Principal Problem:   Osteomyelitis (HCC) Active Problems:   Anemia, unspecified   Alcohol use disorder, severe, dependence (HCC)   Hypokalemia   Thrombocytopenia (HCC)   Portal hypertension with esophageal varices (HCC)   ALC (alcoholic liver cirrhosis) (HCC)   Chronic hepatitis C (HCC)   Alcohol use disorder   Left leg pain   Wound infection   Lactic acidosis   Hypernatremia   HPI: Derrick Blanchard is a 43 y.o. male history of alcohol use disorder and housing insecurity, depression, metALD with cirrhosis, portal hypertension and esophageal varices, chronic hepatitis C, prior right TMA.  He is admitted with worsening L foot infection after TMA 12/2023 complicated by dehiscence and poor healing. He was at [facility] from 5/21-6/5 during which he had revision of TMA 5/23. Cxs grew proteus, corynebacterium, anaerobes and VRE. Treated with dapto zosyn then he was dced on augmentin  and linezolid  which was to be on until 6/8. On admit wbc was 5.9, no fevers, started on dapto and zosyn. Xray with some concern for distal MT osteo.   Past Medical History:  Diagnosis Date   Anemia    Anxiety    Cirrhosis (HCC)    Depression    Heart murmur    Hepatitis    Hypertension    Substance abuse [facility])    Past Surgical History:  Procedure Laterality Date   AMPUTATION Left 12/29/2023   Procedure: AMPUTATION, FOOT, RAY;  Surgeon: Evertt Hoe, DPM;  Location: [facility] ORS;  Service: Orthopedics/Podiatry;  Laterality: Left;  Partial first ray amp, abx beads, graft application   COLONOSCOPY     ESOPHAGOGASTRODUODENOSCOPY (EGD) WITH PROPOFOL  N/A 02/03/2015   Procedure: ESOPHAGOGASTRODUODENOSCOPY (EGD) WITH PROPOFOL ;  Surgeon: Luella Sager, MD;  Location[facility] ENDOSCOPY;  Service: Endoscopy;  Laterality: N/A;   ESOPHAGOGASTRODUODENOSCOPY (EGD) WITH PROPOFOL  N/A  12/21/2017   Procedure: ESOPHAGOGASTRODUODENOSCOPY (EGD) WITH PROPOFOL ;  Surgeon: Luke Salaam, MD;  Location: [facility] ENDOSCOPY;  Service: Gastroenterology;  Laterality: N/A;   FISSURECTOMY     IRRIGATION AND DEBRIDEMENT FOOT Left 12/16/2023   Procedure: IRRIGATION AND DEBRIDEMENT FOOT, possible sesamoidectomy, bone biopsy;  Surgeon: Reina Cara, DPM;  Location: [facility] ORS;  Service: Podiatry;  Laterality: Left;  Pulse lavage, bone biopsy with jam shidi needle, mini c arm, sagittal saw available   Right transmet amputation Right    TRANSMETATARSAL AMPUTATION Left 01/03/2024   Procedure: AMPUTATION, FOOT, TRANSMETATARSAL;  Surgeon: Evertt Hoe, DPM;  Location: [facility] ORS;  Service: Orthopedics/Podiatry;  Laterality: Left;  Left foot Trans met amputation   Social History   Tobacco Use   Smoking status: Some Days    Current packs/day: 0.25    Types: Cigarettes   Smokeless tobacco: Never  Vaping Use   Vaping status: Never Used  Substance Use Topics   Alcohol use: Yes    Comment: since 10/03/17   Drug use: Yes    Types: Marijuana    Comment: 12/21/17   Family History  Problem Relation Age of Onset   Lung cancer Mother    Cirrhosis Father    Lung cancer Paternal Grandfather     Allergies: No Known Allergies  Current antibiotics: Antibiotics Given (last 72 hours)     Date/Time Action Medication Dose Rate   02/11/24 2040 New Bag/Given   piperacillin-tazobactam (ZOSYN) IVPB 3.375 g 3.375 g 100 mL/hr   02/11/24 2227 New  Bag/Given  [waited on pharm to deliver]   DAPTOmycin (CUBICIN) 800 mg in sodium chloride  0.9 % IVPB 800 mg 132 mL/hr   02/12/24 0341 New Bag/Given   [MAR Hold] piperacillin-tazobactam (ZOSYN) IVPB 3.375 g MAR Hold since Mon 02/12/2024 at 1135.Hold Reason: Transfer to a Procedural area 3.375 g 12.5 mL/hr   02/12/24 1031 New Bag/Given   [MAR Hold] piperacillin-tazobactam (ZOSYN) IVPB 3.375 g MAR Hold since Mon 02/12/2024 at 1135.Hold Reason: Transfer to a Procedural  area 3.375 g 12.5 mL/hr   02/12/24 1400 Given   ceFAZolin (ANCEF) IVPB 2g/100 mL premix 2 g        MEDICATIONS:  sodium chloride    Intravenous Once   chlorhexidine   60 mL Topical Once   And   [START ON 02/13/2024] chlorhexidine   60 mL Topical Once   [MAR Hold] folic acid   1 mg Oral Daily   [MAR Hold] lactulose   20 g Oral Daily   [MAR Hold] multivitamin with minerals  1 tablet Oral Daily   [MAR Hold] pantoprazole   40 mg Oral Daily   [MAR Hold] potassium chloride   40 mEq Oral Once   [MAR Hold] thiamine   100 mg Oral Daily   Or   [MAR Hold] thiamine   100 mg Intravenous Daily    Review of Systems - 11 systems reviewed and negative per HPI   OBJECTIVE: Temp:  [98 F (36.7 C)-99 F (37.2 C)] 98 F (36.7 C) (06/09 1157) Pulse Rate:  [61-98] 84 (06/09 1157) Resp:  [16-22] 16 (06/09 1157) BP: (110-136)/(62-110) 118/70 (06/09 1157) SpO2:  [85 %-100 %] 99 % (06/09 1157) Weight:  [96.2 kg-101.3 kg] 96.2 kg (06/09 1157)   LABS: Results for orders placed or performed during the hospital encounter of 02/11/24 (from the past 48 hours)  Lactic acid, plasma     Status: Abnormal   Collection Time: 02/11/24  6:22 PM  Result Value Ref Range   Lactic Acid, Venous 2.2 (HH) 0.5 - 1.9 mmol/L    Comment: CRITICAL RESULT CALLED TO, READ BACK BY AND VERIFIED WITH ELIZABETH TEASLEY @ 02/11/24 2107 AB Performed at [facility], [address] [place], [place] [address]   Comprehensive metabolic panel     Status: Abnormal   Collection Time: 02/11/24  6:22 PM  Result Value Ref Range   Sodium 144 135 - 145 mmol/L   Potassium 3.6 3.5 - 5.1 mmol/L   Chloride 114 (H) 98 - 111 mmol/L   CO2 20 (L) 22 - 32 mmol/L   Glucose, Bld 119 (H) 70 - 99 mg/dL    Comment: Glucose reference range applies only to samples taken after fasting for at least 8 hours.   BUN 7 6 - 20 mg/dL   Creatinine, Ser 6.04 (L) 0.61 - 1.24 mg/dL   Calcium  8.6 (L) 8.9 - 10.3 mg/dL   Total Protein 6.6 6.5 - 8.1 g/dL    Albumin 3.2 (L) 3.5 - 5.0 g/dL   AST 43 (H) 15 - 41 U/L   ALT 19 0 - 44 U/L   Alkaline Phosphatase 73 38 - 126 U/L   Total Bilirubin 1.6 (H) 0.0 - 1.2 mg/dL   GFR, Estimated >54 >09 mL/min    Comment: (NOTE) Calculated using the CKD-EPI Creatinine Equation (2021)    Anion gap 10 5 - 15    Comment: Performed at [facility], [address], [place], [place] [address]  CBC with Differential     Status: Abnormal   Collection Time: 02/11/24  6:22 PM  Result Value Ref Range   WBC 5.9 4.0 - 10.5 K/uL   RBC 3.17 (L) 4.22 - 5.81 MIL/uL   Hemoglobin 8.3 (L) 13.0 - 17.0 g/dL   HCT 82.9 (L) 56.2 - 13.0 %   MCV 82.6 80.0 - 100.0 fL   MCH 26.2 26.0 - 34.0 pg   MCHC 31.7 30.0 - 36.0 g/dL   RDW 86.5 (H) 78.4 - 69.6 %   Platelets 60 (L) 150 - 400 K/uL    Comment: Immature Platelet Fraction may be clinically indicated, consider ordering this additional test [id number] PLATELET COUNT CONFIRMED BY SMEAR    nRBC 0.0 0.0 - 0.2 %   Neutrophils Relative % 75 %   Neutro Abs 4.5 1.7 - 7.7 K/uL   Lymphocytes Relative 13 %   Lymphs Abs 0.8 0.7 - 4.0 K/uL   Monocytes Relative 10 %   Monocytes Absolute 0.6 0.1 - 1.0 K/uL   Eosinophils Relative 0 %   Eosinophils Absolute 0.0 0.0 - 0.5 K/uL   Basophils Relative 1 %   Basophils Absolute 0.0 0.0 - 0.1 K/uL   Immature Granulocytes 1 %   Abs Immature Granulocytes 0.03 0.00 - 0.07 K/uL    Comment: Performed at [facility], [address] [place], [place] [address]  Blood Culture (routine x 2)     Status: None (Preliminary result)   Collection Time: 02/11/24  6:22 PM   Specimen: BLOOD  Result Value Ref Range   Specimen Description BLOOD BLOOD RIGHT FOREARM    Special Requests      BOTTLES DRAWN AEROBIC AND ANAEROBIC Blood Culture adequate volume   Culture      NO GROWTH < 12 HOURS Performed at [facility], [address] [place], [place] [address]    Report Status PENDING   Blood Culture (routine x 2)     Status:  None (Preliminary result)   Collection Time: 02/11/24  6:22 PM   Specimen: BLOOD  Result Value Ref Range   Specimen Description BLOOD RIGHT ANTECUBITAL    Special Requests      BOTTLES DRAWN AEROBIC AND ANAEROBIC Blood Culture adequate volume   Culture      NO GROWTH < 12 HOURS Performed at [facility], [address] [place], [place] [address]    Report Status PENDING   Lactic acid, plasma     Status: Abnormal   Collection Time: 02/12/24  1:27 AM  Result Value Ref Range   Lactic Acid, Venous 3.2 (HH) 0.5 - 1.9 mmol/L    Comment: CRITICAL VALUE NOTED. VALUE IS CONSISTENT WITH PREVIOUSLY REPORTED/CALLED VALUE JL Performed at [facility], [address] [place], [place] [address]   Basic metabolic panel     Status: Abnormal   Collection Time: 02/12/24  1:27 AM  Result Value Ref Range   Sodium 146 (H) 135 - 145 mmol/L   Potassium 3.3 (L) 3.5 - 5.1 mmol/L   Chloride 117 (H) 98 - 111 mmol/L   CO2 22 22 - 32 mmol/L   Glucose, Bld 122 (H) 70 - 99 mg/dL    Comment: Glucose reference range applies only to samples taken after fasting for at least 8 hours.   BUN 7 6 - 20 mg/dL   Creatinine, Ser 2.59 0.61 - 1.24 mg/dL   Calcium  8.5 (L) 8.9 - 10.3 mg/dL    Comment: REPEATED TO VERIFY JL   GFR, Estimated >60 >60 mL/min    Comment: (NOTE) Calculated using  the CKD-EPI Creatinine Equation (2021)    Anion gap 7 5 - 15    Comment: Performed at [facility], [address] [place], [place] [address]  CBC     Status: Abnormal   Collection Time: 02/12/24  1:27 AM  Result Value Ref Range   WBC 5.1 4.0 - 10.5 K/uL   RBC 3.07 (L) 4.22 - 5.81 MIL/uL   Hemoglobin 8.0 (L) 13.0 - 17.0 g/dL   HCT 96.2 (L) 95.2 - 84.1 %   MCV 83.7 80.0 - 100.0 fL   MCH 26.1 26.0 - 34.0 pg   MCHC 31.1 30.0 - 36.0 g/dL   RDW 32.4 (H) 40.1 - 02.7 %   Platelets 60 (L) 150 - 400 K/uL    Comment: Immature Platelet Fraction may be clinically indicated, consider ordering this  additional test [id number] REPEATED TO VERIFY    nRBC 0.0 0.0 - 0.2 %    Comment: Performed at [facility], [address] [place], [place] [address]  Type and screen [facility]     Status: None (Preliminary result)   Collection Time: 02/12/24  9:52 AM  Result Value Ref Range   ABO/RH(D) O POS    Antibody Screen NEG    Sample Expiration      02/15/2024,2359 Performed at [facility], [address] [place], [place] [address]    Unit Number [id number]    Blood Component Type RED CELLS,LR    Unit division 00    Status of Unit ALLOCATED    Transfusion Status OK TO TRANSFUSE    Crossmatch Result Compatible    Unit Number [id number]    Blood Component Type RED CELLS,LR    Unit division 00    Status of Unit ALLOCATED    Transfusion Status OK TO TRANSFUSE    Crossmatch Result Compatible   Prepare RBC (crossmatch)     Status: None   Collection Time: 02/12/24  2:15 PM  Result Value Ref Range   Order Confirmation      ORDER PROCESSED BY BLOOD BANK Performed at [facility], [address], [place], [place] [address]   Prepare platelet pheresis     Status: None (Preliminary result)   Collection Time: 02/12/24  2:24 PM  Result Value Ref Range   Unit Number [id number]    Blood Component Type PLTP2 PSORALEN TREATED    Unit division 00    Status of Unit ALLOCATED    Transfusion Status OK TO TRANSFUSE    No components found for: "ESR", "C REACTIVE PROTEIN" MICRO: Recent Results (from the past 720 hours)  Blood Culture (routine x 2)     Status: None (Preliminary result)   Collection Time: 02/11/24  6:22 PM   Specimen: BLOOD  Result Value Ref Range Status   Specimen Description BLOOD BLOOD RIGHT FOREARM  Final   Special Requests   Final    BOTTLES DRAWN AEROBIC AND ANAEROBIC Blood Culture adequate volume   Culture   Final    NO GROWTH < 12 HOURS Performed at [facility], [address] [place], [place]  [address]    Report Status PENDING  Incomplete  Blood Culture (routine x 2)     Status: None (Preliminary result)   Collection Time: 02/11/24  6:22 PM   Specimen: BLOOD  Result Value Ref Range Status   Specimen Description BLOOD RIGHT ANTECUBITAL  Final   Special Requests   Final    BOTTLES DRAWN AEROBIC AND  ANAEROBIC Blood Culture adequate volume   Culture   Final    NO GROWTH < 12 HOURS Performed at [facility], [address] [place], [place] [address]    Report Status PENDING  Incomplete    IMAGING: DG Foot Complete Left Result Date: 02/11/2024 CLINICAL DATA:  Persistent pain following transmetatarsal amputation EXAM: LEFT FOOT - COMPLETE 3+ VIEW COMPARISON:  01/06/2024 FINDINGS: There has been interval progressive removal of the metatarsal bases with small bony fragments identified. The margins appear irregular suspicious for underlying osteomyelitis. Soft tissue swelling the stump is noted. IMPRESSION: Interval progressive resection of the metatarsal bases. The margins appear mildly irregular suspicious for osteomyelitis. Electronically Signed   By: Violeta Grey M.D.   On: 02/11/2024 19:57   US  Venous Img Lower Bilateral (DVT) Result Date: 01/15/2024 CLINICAL DATA:  Swelling. EXAM: BILATERAL LOWER EXTREMITY VENOUS DOPPLER ULTRASOUND TECHNIQUE: Gray-scale sonography with compression, as well as color and duplex ultrasound, were performed to evaluate the deep venous system(s) from the level of the common femoral vein through the popliteal and proximal calf veins. COMPARISON:  Left lower extremity duplex 11/07/2018 FINDINGS: VENOUS Normal compressibility of the common femoral, superficial femoral, and popliteal veins, as well as the visualized calf veins. Visualized portions of profunda femoral vein and great saphenous vein unremarkable. No filling defects to suggest DVT on grayscale or color Doppler imaging. Doppler waveforms show normal direction of venous flow, normal respiratory  plasticity and response to augmentation. OTHER None. Limitations: none IMPRESSION: No sonographic evidence of bilateral lower extremity DVT. Electronically Signed   By: Chadwick Colonel M.D.   On: 01/15/2024 15:48    Assessment:   Derrick Blanchard is a 43 y.o. male ***  Recommendations  Thank you very much for allowing me to participate in the care of this patient. Please call with questions.   Merri Abbe. Harwood Lingo, MD

## 2024-02-12 NOTE — Assessment & Plan Note (Addendum)
 Of left transmet amputation site.  Recent OR culture with Proteus and corynebacterium.  Completed linezolid  and Augmentin .  Patient given Zosyn and daptomycin in the emergency room.  Postoperative day 1 for left BKA by Dr. Vonna Guardian.  Continue Zosyn today.  May be able to get rid of antibiotics tomorrow.

## 2024-02-12 NOTE — Progress Notes (Signed)
 Pharmacy Antibiotic Note  Derrick Blanchard is a 43 y.o. male admitted on 02/11/2024 with wound infection.  Pharmacy has been consulted for Zosyn dosing.  Plan: Zosyn 3.375g IV q8h (4 hour infusion).  Height: 5\' 7"  (170.2 cm) Weight: 101.3 kg (223 lb 5.2 oz) IBW/kg (Calculated) : 66.1  Temp (24hrs), Avg:98.8 F (37.1 C), Min:98.6 F (37 C), Max:99 F (37.2 C)  Recent Labs  Lab 02/11/24 1822  WBC 5.9  CREATININE 0.57*  LATICACIDVEN 2.2*    Estimated Creatinine Clearance: 139.2 mL/min (A) (by C-G formula based on SCr of 0.57 mg/dL (L)).    No Known Allergies  Antimicrobials this admission:   >>    >>   Dose adjustments this admission:   Microbiology results:  BCx:   UCx:    Sputum:    MRSA PCR:   Thank you for allowing pharmacy to be a part of this patient's care.  Doss Cybulski D 02/12/2024 12:07 AM

## 2024-02-12 NOTE — Consult Note (Signed)
 Hospital Consult    Reason for Consult:  Left Lower Extremity Post Surgical Infection.  Requesting Physician:  Dr Verla Glaze MD MRN #:  [id number]  History of Present Illness: This is a 43 y.o. male  with medical history significant for Being admitted with pain and bleeding from a left TMA for which he recently underwent revision at [facility] on 5/23, discharged on 02/08/2024.  Triage note states that he was brought in by EMS from the side of the road with a complaint that the wound was bleeding.  Patient has a history of alcohol use disorder and housing insecurity, depression, metALD with cirrhosis, portal hypertension and esophageal varices, chronic hepatitis C,  prior right TMA.  He had left TMA 12/2023 which was complicated by dehiscence for which she was hospitalized at [facility] from 5/12 to 01/23/2024.  BKA was recommended however he declined opting to go to [facility] for an opinion.  He was admitted to [facility] the following day on 5/21 where he remained until 6/5.  Wound cultures grew Proteus and Corynebacterium and he completed linezolid  and Augmentin  today 02/11/2024. Patient states he is in a lot of pain and he is now willing to undergo the previously recommended BKA. Vascular Surgery consulted to provide left BKA.   Past Medical History:  Diagnosis Date   Anemia    Anxiety    Cirrhosis (HCC)    Depression    Heart murmur    Hepatitis    Hypertension    Substance abuse [facility])     Past Surgical History:  Procedure Laterality Date   AMPUTATION Left 12/29/2023   Procedure: AMPUTATION, FOOT, RAY;  Surgeon: Evertt Hoe, DPM;  Location: [facility] ORS;  Service: Orthopedics/Podiatry;  Laterality: Left;  Partial first ray amp, abx beads, graft application   COLONOSCOPY     ESOPHAGOGASTRODUODENOSCOPY (EGD) WITH PROPOFOL  N/A 02/03/2015   Procedure: ESOPHAGOGASTRODUODENOSCOPY (EGD) WITH PROPOFOL ;  Surgeon: Luella Sager, MD;  Location: [facility] ENDOSCOPY;  Service: Endoscopy;  Laterality: N/A;    ESOPHAGOGASTRODUODENOSCOPY (EGD) WITH PROPOFOL  N/A 12/21/2017   Procedure: ESOPHAGOGASTRODUODENOSCOPY (EGD) WITH PROPOFOL ;  Surgeon: Luke Salaam, MD;  Location: [facility] ENDOSCOPY;  Service: Gastroenterology;  Laterality: N/A;   FISSURECTOMY     IRRIGATION AND DEBRIDEMENT FOOT Left 12/16/2023   Procedure: IRRIGATION AND DEBRIDEMENT FOOT, possible sesamoidectomy, bone biopsy;  Surgeon: Reina Cara, DPM;  Location: [facility] ORS;  Service: Podiatry;  Laterality: Left;  Pulse lavage, bone biopsy with jam shidi needle, mini c arm, sagittal saw available   Right transmet amputation Right    TRANSMETATARSAL AMPUTATION Left 01/03/2024   Procedure: AMPUTATION, FOOT, TRANSMETATARSAL;  Surgeon: Evertt Hoe, DPM;  Location: [facility] ORS;  Service: Orthopedics/Podiatry;  Laterality: Left;  Left foot Trans met amputation    No Known Allergies  Prior to Admission medications   Medication Sig Start Date End Date Taking? Authorizing Provider  folic acid  (FOLVITE ) 1 MG tablet Take 1 tablet (1 mg total) by mouth daily. 01/23/24   Tiajuana Fluke, MD  lactulose  (CHRONULAC ) 10 GM/15ML solution Take 30 mLs (20 g total) by mouth daily. 01/24/24   Tiajuana Fluke, MD  Multiple Vitamin (MULTIVITAMIN WITH MINERALS) TABS tablet Take 1 tablet by mouth daily. 01/23/24   Tiajuana Fluke, MD  Oxycodone  HCl 10 MG TABS Take 1-2 tablets (10-20 mg total) by mouth every 3 (three) hours as needed for moderate pain (pain score 4-6) or severe pain (pain score 7-10). 01/23/24   Tiajuana Fluke, MD  pantoprazole  (PROTONIX ) 40 MG  tablet Take 1 tablet (40 mg total) by mouth daily. 01/23/24   Tiajuana Fluke, MD  thiamine  (VITAMIN B1) 100 MG tablet Take 1 tablet (100 mg total) by mouth daily. 01/23/24   Tiajuana Fluke, MD    Social History   Socioeconomic History   Marital status: Single    Spouse name: Not on file   Number of children: Not on file   Years of education: Not on file   Highest education level:  Not on file  Occupational History   Not on file  Tobacco Use   Smoking status: Some Days    Current packs/day: 0.25    Types: Cigarettes   Smokeless tobacco: Never  Vaping Use   Vaping status: Never Used  Substance and Sexual Activity   Alcohol use: Yes    Comment: since 10/03/17   Drug use: Yes    Types: Marijuana    Comment: 12/21/17   Sexual activity: Yes  Other Topics Concern   Not on file  Social History Narrative   Not on file   Social Drivers of Health   Financial Resource Strain: Medium Risk (01/25/2024)   Received from [facility]   Overall Financial Resource Strain (CARDIA)    Difficulty of Paying Living Expenses: Somewhat hard  Food Insecurity: Food Insecurity Present (02/12/2024)   Hunger Vital Sign    Worried About Running Out of Food in the Last Year: Often true    Ran Out of Food in the Last Year: Often true  Transportation Needs: Unmet Transportation Needs (02/12/2024)   PRAPARE - Administrator, Civil Service (Medical): Yes    Lack of Transportation (Non-Medical): No  Physical Activity: Not on file  Stress: Not on file  Social Connections: Unknown (01/15/2024)   Social Connection and Isolation Panel [NHANES]    Frequency of Communication with Friends and Family: Patient declined    Frequency of Social Gatherings with Friends and Family: Patient declined    Attends Religious Services: Patient declined    Database administrator or Organizations: Patient declined    Attends Banker Meetings: Patient declined    Marital Status: Never married  Intimate Partner Violence: Not At Risk (02/12/2024)   Humiliation, Afraid, Rape, and Kick questionnaire    Fear of Current or Ex-Partner: No    Emotionally Abused: No    Physically Abused: No    Sexually Abused: No     Family History  Problem Relation Age of Onset   Lung cancer Mother    Cirrhosis Father    Lung cancer Paternal Grandfather     ROS: Otherwise negative  unless mentioned in HPI  Physical Examination  Vitals:   02/12/24 0404 02/12/24 0801  BP: 110/70 118/65  Pulse: 76 67  Resp: 18 16  Temp: 98.5 F (36.9 C) 98.4 F (36.9 C)  SpO2: 100% 100%   Body mass index is 34.98 kg/m.  General:  WDWN in NAD Gait: Not observed HENT: WNL, normocephalic Pulmonary: normal non-labored breathing, without Rales, rhonchi,  wheezing Cardiac: regular, without  Murmurs, rubs or gallops; without carotid bruits Abdomen: Positive bowel sounds,  soft, NT/ND, no masses Skin: without rashes Vascular Exam/Pulses: Palpable Pulses throughout, All extremities are warm to touch.  Extremities: with ischemic changes, without Gangrene , with cellulitis; with open wounds; Left post operative TMA non healing surgical wound.  Musculoskeletal: no muscle wasting or atrophy  Neurologic: A&O X 3;  No focal weakness or paresthesias are detected;  speech is fluent/normal Psychiatric:  The pt has Normal affect. Lymph:  Unremarkable  CBC    Component Value Date/Time   WBC 5.1 02/12/2024 0127   RBC 3.07 (L) 02/12/2024 0127   HGB 8.0 (L) 02/12/2024 0127   HGB 12.1 (L) 09/20/2013 1624   HCT 25.7 (L) 02/12/2024 0127   HCT 36.8 (L) 09/20/2013 1624   PLT 60 (L) 02/12/2024 0127   PLT 97 (L) 09/20/2013 1624   MCV 83.7 02/12/2024 0127   MCV 92 09/20/2013 1624   MCH 26.1 02/12/2024 0127   MCHC 31.1 02/12/2024 0127   RDW 20.6 (H) 02/12/2024 0127   RDW 14.0 09/20/2013 1624   LYMPHSABS 0.8 02/11/2024 1822   LYMPHSABS 1.2 09/20/2013 1624   MONOABS 0.6 02/11/2024 1822   MONOABS 0.5 09/20/2013 1624   EOSABS 0.0 02/11/2024 1822   EOSABS 0.1 09/20/2013 1624   BASOSABS 0.0 02/11/2024 1822   BASOSABS 0.0 09/20/2013 1624    BMET    Component Value Date/Time   NA 146 (H) 02/12/2024 0127   NA 144 11/24/2017 1025   NA 138 02/12/2013 0508   K 3.3 (L) 02/12/2024 0127   K 4.7 02/12/2013 0508   CL 117 (H) 02/12/2024 0127   CL 105 02/12/2013 0508   CO2 22 02/12/2024 0127    CO2 27 02/12/2013 0508   GLUCOSE 122 (H) 02/12/2024 0127   GLUCOSE 107 (H) 02/12/2013 0508   BUN 7 02/12/2024 0127   BUN 5 (L) 11/24/2017 1025   BUN 16 02/12/2013 0508   CREATININE 0.62 02/12/2024 0127   CREATININE 0.51 (L) 02/12/2013 0508   CALCIUM  8.5 (L) 02/12/2024 0127   CALCIUM  8.3 (L) 02/12/2013 0508   GFRNONAA >60 02/12/2024 0127   GFRNONAA >60 02/12/2013 0508   GFRAA >60 11/07/2018 0153   GFRAA >60 02/12/2013 0508    COAGS: Lab Results  Component Value Date   INR 1.4 (H) 12/13/2023   INR 1.5 (H) 03/10/2023   INR 1.63 10/24/2017     Non-Invasive Vascular Imaging:   EXAM: LEFT FOOT - COMPLETE 3+ VIEW   COMPARISON:  01/06/2024   FINDINGS: There has been interval progressive removal of the metatarsal bases with small bony fragments identified. The margins appear irregular suspicious for underlying osteomyelitis. Soft tissue swelling the stump is noted.   IMPRESSION: Interval progressive resection of the metatarsal bases. The margins appear mildly irregular suspicious for osteomyelitis.  Statin:  No. Beta Blocker:  No. Aspirin:  No. ACEI:  No. ARB:  No. CCB use:  No Other antiplatelets/anticoagulants:  No.    ASSESSMENT/PLAN: This is a 43 y.o. male presents to [facility] emergency department with an infected left postoperative TMA he had at [facility].  Wound cultures grew Proteus and Cornia bacterium even while being on linezolid .  Patient's lactic acid levels are up to 3.2 today.  Plan is amputation of left lower extremity below the knee.  I discussed in detail this morning at the bedside with the patient the procedure, benefits, risk, and complications.  Patient verbalizes understanding and wishes to proceed.  Answered all his questions this morning.  Patient endorses he has been n.p.o. since midnight last night.  Plan is for left lower extremity below the knee amputation later today.  I discussed the case with Dr. Mikki Alexander MD and he agrees with plan.   Annamaria Barrette [facility] 02/12/2024 8:19 AM

## 2024-02-12 NOTE — Op Note (Signed)
   OPERATIVE NOTE   PROCEDURE: Left below-the-knee amputation  PRE-OPERATIVE DIAGNOSIS: Left foot infection s/p left TMA  POST-OPERATIVE DIAGNOSIS: same as above  SURGEON: Mikki Alexander, MD  ASSISTANT(S): none  ANESTHESIA: general  ESTIMATED BLOOD LOSS: 500 cc  FINDING(S): none  SPECIMEN(S):  Left below-the-knee amputation  INDICATIONS:   Derrick Blanchard is a 43 y.o. male who presents with left foot infection after left TMA previously.  The patient is scheduled for a left below-the-knee amputation.  I discussed in depth with the patient the risks, benefits, and alternatives to this procedure.  The patient is aware that the risk of this operation included but are not limited to:  bleeding, infection, myocardial infarction, stroke, death, failure to heal amputation wound, and possible need for more proximal amputation.  The patient is aware of the risks and agrees proceed forward with the procedure.  DESCRIPTION:  After full informed written consent was obtained from the patient, the patient was brought back to the operating room, and placed supine upon the operating table.  Prior to induction, the patient received IV antibiotics.  The patient was then prepped and draped in the standard fashion for a below-the-knee amputation.  After obtaining adequate anesthesia, the patient was prepped and draped in the standard fashion for a left below-the-knee amputation.  I marked out the anterior incision two finger breadths below the tibial tuberosity and then the marked out a posterior flap that was one third of the circumference of the calf in length.   I made the incisions for these flaps, and then dissected through the subcutaneous tissue, fascia, and muscle anteriorly.  I elevated  the periosteal tissue superiorly so that the tibia was about 3-4 cm shorter than the anterior skin flap.  I then transected the tibia with a power saw and then took a wedge off the tibia anteriorly with the power saw.   Then I smoothed out the rough edges.  In a similar fashion, I cut back the fibula about two centimeters higher than the level of the tibia with a bone cutter.  I put a bone hook into the distal tibia and then used a large amputation knife to sharply develop a tissue plane through the muscle along the fibula.  In such fashion, the posterior flap was developed.  At this point, the specimen was passed off the field as the below-the-knee amputation.  At this point, I clamped all visibly bleeding arteries and veins using a combination of suture ligation with Silk suture and electrocautery.  Bleeding continued to be controlled with electrocautery and suture ligature.  The stump was washed off with sterile normal saline and no further active bleeding was noted.  I reapproximated the anterior and posterior fascia  with interrupted stitches of 0 Vicryl.  This was completed along the entire length of anterior and posterior fascia until there were no more loose space in the fascial line. I then placed a layer of 2-0 Vicryl sutures in the subcutaneous tissue. The skin was then  reapproximated with staples.  The stump was washed off and dried.  The incision was dressed with Xeroform and  then fluffs were applied.  Kerlix was wrapped around the leg and then gently an ACE wrap was applied.    COMPLICATIONS: none  CONDITION: stable   Mikki Alexander  02/12/2024, 3:53 PM    This note was created with Dragon Medical transcription system. Any errors in dictation are purely unintentional.

## 2024-02-12 NOTE — H&P (Addendum)
 History and Physical    Patient: Derrick Blanchard GNF:[id number] DOB: 11-22-1980 DOA: 02/11/2024 DOS: the patient was seen and examined on 02/12/2024 PCP: Patient, No Pcp Per  Patient coming from: Homeless  Chief Complaint:  Chief Complaint  Patient presents with   Wound Check   Alcohol Intoxication    HPI: Derrick Blanchard is a 43 y.o. male with medical history significant for Being admitted with pain and bleeding from a left TMA for which he recently underwent revision at [facility] on 5/23, discharged on 02/08/2024.  Triage note states that he was brought in by EMS from the side of the road with a complaint that the wound was bleeding.  Patient has a history of alcohol use disorder and housing insecurity, depression, metALD with cirrhosis, portal hypertension and esophageal varices, chronic hepatitis C,  prior right TMA.  He had left TMA 12/2023 which was complicated by dehiscence for which she was hospitalized at [facility] from 5/12 to 01/23/2024.  BKA was recommended however he declined opting to go to [facility] for an opinion.  He was admitted to [facility] the following day on 5/21 where he remained until 6/5.  Wound cultures grew Proteus and Corynebacterium and he completed linezolid  and Augmentin  today 02/11/2024. Patient states he is in a lot of pain and he is now willing to undergo the previously recommended BKA. In the ED, vitals were within normal limits.  WBC 5.9 with lactic acid 2.2.  Hemoglobin at baseline at 8.3 with baseline thrombocytopenia of 60.  LFTs also at baseline.  X-rays of the left foot suspicious for osteomyelitis. Patient started on Zosyn.  Admission requested     Review of Systems: As mentioned in the history of present illness. All other systems reviewed and are negative.  Past Medical History:  Diagnosis Date   Anemia    Anxiety    Cirrhosis (HCC)    Depression    Heart murmur    Hepatitis    Hypertension    Substance abuse [facility])    Past Surgical History:  Procedure  Laterality Date   AMPUTATION Left 12/29/2023   Procedure: AMPUTATION, FOOT, RAY;  Surgeon: Evertt Hoe, DPM;  Location: [facility] ORS;  Service: Orthopedics/Podiatry;  Laterality: Left;  Partial first ray amp, abx beads, graft application   COLONOSCOPY     ESOPHAGOGASTRODUODENOSCOPY (EGD) WITH PROPOFOL  N/A 02/03/2015   Procedure: ESOPHAGOGASTRODUODENOSCOPY (EGD) WITH PROPOFOL ;  Surgeon: Luella Sager, MD;  Location: [facility] ENDOSCOPY;  Service: Endoscopy;  Laterality: N/A;   ESOPHAGOGASTRODUODENOSCOPY (EGD) WITH PROPOFOL  N/A 12/21/2017   Procedure: ESOPHAGOGASTRODUODENOSCOPY (EGD) WITH PROPOFOL ;  Surgeon: Luke Salaam, MD;  Location: [facility] ENDOSCOPY;  Service: Gastroenterology;  Laterality: N/A;   FISSURECTOMY     IRRIGATION AND DEBRIDEMENT FOOT Left 12/16/2023   Procedure: IRRIGATION AND DEBRIDEMENT FOOT, possible sesamoidectomy, bone biopsy;  Surgeon: Reina Cara, DPM;  Location: [facility] ORS;  Service: Podiatry;  Laterality: Left;  Pulse lavage, bone biopsy with jam shidi needle, mini c arm, sagittal saw available   Right transmet amputation Right    TRANSMETATARSAL AMPUTATION Left 01/03/2024   Procedure: AMPUTATION, FOOT, TRANSMETATARSAL;  Surgeon: Evertt Hoe, DPM;  Location: [facility] ORS;  Service: Orthopedics/Podiatry;  Laterality: Left;  Left foot Trans met amputation   Social History:  reports that he has been smoking cigarettes. He has never used smokeless tobacco. He reports current alcohol use. He reports current drug use. Drug: Marijuana.  No Known Allergies  Family History  Problem Relation Age of Onset   Lung cancer  Mother    Cirrhosis Father    Lung cancer Paternal Grandfather     Prior to Admission medications   Medication Sig Start Date End Date Taking? Authorizing Provider  folic acid  (FOLVITE ) 1 MG tablet Take 1 tablet (1 mg total) by mouth daily. 01/23/24   Tiajuana Fluke, MD  lactulose  (CHRONULAC ) 10 GM/15ML solution Take 30 mLs (20 g total) by  mouth daily. 01/24/24   Tiajuana Fluke, MD  Multiple Vitamin (MULTIVITAMIN WITH MINERALS) TABS tablet Take 1 tablet by mouth daily. 01/23/24   Tiajuana Fluke, MD  Oxycodone  HCl 10 MG TABS Take 1-2 tablets (10-20 mg total) by mouth every 3 (three) hours as needed for moderate pain (pain score 4-6) or severe pain (pain score 7-10). 01/23/24   Tiajuana Fluke, MD  pantoprazole  (PROTONIX ) 40 MG tablet Take 1 tablet (40 mg total) by mouth daily. 01/23/24   Tiajuana Fluke, MD  thiamine  (VITAMIN B1) 100 MG tablet Take 1 tablet (100 mg total) by mouth daily. 01/23/24   Tiajuana Fluke, MD    Physical Exam: Vitals:   02/11/24 1809 02/11/24 2100 02/11/24 2117 02/11/24 2231  BP:  111/68    Pulse:  68    Resp:  (!) 22    Temp:    98.6 F (37 C)  TempSrc:    Oral  SpO2:  100%    Weight:   101.3 kg   Height: 5\' 7"  (1.702 m)      Physical Exam Vitals and nursing note reviewed.  Constitutional:      General: He is not in acute distress.    Comments: Disheveled appearance  HENT:     Head: Normocephalic and atraumatic.  Cardiovascular:     Rate and Rhythm: Normal rate and regular rhythm.     Heart sounds: Normal heart sounds.  Pulmonary:     Effort: Pulmonary effort is normal.     Breath sounds: Normal breath sounds.  Abdominal:     Palpations: Abdomen is soft.     Tenderness: There is no abdominal tenderness.  Musculoskeletal:     Comments: See picture below  Neurological:     Mental Status: Mental status is at baseline.        Labs on Admission: I have personally reviewed following labs and imaging studies  CBC: Recent Labs  Lab 02/11/24 1822  WBC 5.9  NEUTROABS 4.5  HGB 8.3*  HCT 26.2*  MCV 82.6  PLT 60*   Basic Metabolic Panel: Recent Labs  Lab 02/11/24 1822  NA 144  K 3.6  CL 114*  CO2 20*  GLUCOSE 119*  BUN 7  CREATININE 0.57*  CALCIUM  8.6*   GFR: Estimated Creatinine Clearance: 139.2 mL/min (A) (by C-G formula based on SCr of 0.57 mg/dL  (L)). Liver Function Tests: Recent Labs  Lab 02/11/24 1822  AST 43*  ALT 19  ALKPHOS 73  BILITOT 1.6*  PROT 6.6  ALBUMIN 3.2*   No results for input(s): "LIPASE", "AMYLASE" in the last 168 hours. No results for input(s): "AMMONIA" in the last 168 hours. Coagulation Profile: No results for input(s): "INR", "PROTIME" in the last 168 hours. Cardiac Enzymes: No results for input(s): "CKTOTAL", "CKMB", "CKMBINDEX", "TROPONINI" in the last 168 hours. BNP (last 3 results) No results for input(s): "PROBNP" in the last 8760 hours. HbA1C: No results for input(s): "HGBA1C" in the last 72 hours. CBG: No results for input(s): "GLUCAP" in the last 168 hours. Lipid Profile: No results for input(s): "  CHOL", "HDL", "LDLCALC", "TRIG", "CHOLHDL", "LDLDIRECT" in the last 72 hours. Thyroid Function Tests: No results for input(s): "TSH", "T4TOTAL", "FREET4", "T3FREE", "THYROIDAB" in the last 72 hours. Anemia Panel: No results for input(s): "VITAMINB12", "FOLATE", "FERRITIN", "TIBC", "IRON", "RETICCTPCT" in the last 72 hours. Urine analysis:    Component Value Date/Time   COLORURINE YELLOW (A) 03/11/2023 0003   APPEARANCEUR CLEAR (A) 03/11/2023 0003   APPEARANCEUR Clear 02/01/2013 1851   LABSPEC 1.006 03/11/2023 0003   LABSPEC 1.015 02/01/2013 1851   PHURINE 6.0 03/11/2023 0003   GLUCOSEU NEGATIVE 03/11/2023 0003   GLUCOSEU 50 mg/dL 16/06/9603 5409   HGBUR NEGATIVE 03/11/2023 0003   BILIRUBINUR NEGATIVE 03/11/2023 0003   BILIRUBINUR 2+ 02/01/2013 1851   KETONESUR NEGATIVE 03/11/2023 0003   PROTEINUR NEGATIVE 03/11/2023 0003   NITRITE NEGATIVE 03/11/2023 0003   LEUKOCYTESUR NEGATIVE 03/11/2023 0003   LEUKOCYTESUR Negative 02/01/2013 1851    Radiological Exams on Admission: DG Foot Complete Left Result Date: 02/11/2024 CLINICAL DATA:  Persistent pain following transmetatarsal amputation EXAM: LEFT FOOT - COMPLETE 3+ VIEW COMPARISON:  01/06/2024 FINDINGS: There has been interval progressive  removal of the metatarsal bases with small bony fragments identified. The margins appear irregular suspicious for underlying osteomyelitis. Soft tissue swelling the stump is noted. IMPRESSION: Interval progressive resection of the metatarsal bases. The margins appear mildly irregular suspicious for osteomyelitis. Electronically Signed   By: Violeta Grey M.D.   On: 02/11/2024 19:57   Data Reviewed for HPI: Relevant notes from primary care and specialist visits, past discharge summaries as available in EHR, including Care Everywhere. Prior diagnostic testing as pertinent to current admission diagnoses Updated medications and problem lists for reconciliation ED course, including vitals, labs, imaging, treatment and response to treatment Triage notes, nursing and pharmacy notes and ED provider's notes Notable results as noted above in HPI      Assessment and Plan:   L TMA dehiscence with underlying osteomyelitis s/p revision 01/26/24 at [facility] Recent OR tissue cultures with Proteus and Corynebacterium  Completed linezolid  and Augmentin  on 6/8  Started on Zosyn and daptomycin in the ED Continue antibiotics Splint was ordered however patient was not wearing it-supposed to be NWB  Vascular consulted, as patient now open to BKA N.p.o. from midnight in case of procedure  Lactic acidosis, possible sepsis Suspect related to tissue hypoxia as patient not otherwise meeting sepsis criteria, and simply completed a 2-week course of antibiotics Will give a fluid bolus and continue to trend lactic Continue current antibiotics, Zosyn and daptomycin  Thrombocytopenia and anemia, chronic At baseline-to underlying liver disease and alcohol abuse SCD to nonaffected leg  Alcohol use disorder Complicates all aspects of his care.  At no withdrawal this during recent admission to [facility] CIWA withdrawal protocol Folate thiamine  and multivitamin  Cirrhosis, alcoholic Portal hypertension and esophageal  varices Not currently on related meds  Chronic HCV HCV PCR at [facility] positive for low level viremia.  Per ID at [facility], will need fibroscan outpatient before initiation of HCV therapy         DVT prophylaxis: SCD to nonaffected leg  Consults: Vascular, Dr. Charlotte Cookey  Advance Care Planning:   Code Status: Full Code   Family Communication: none  Disposition Plan: Back to previous home environment  Severity of Illness: The appropriate patient status for this patient is INPATIENT. Inpatient status is judged to be reasonable and necessary in order to provide the required intensity of service to ensure the patient's safety. The patient's presenting symptoms, physical exam findings, and  initial radiographic and laboratory data in the context of their chronic comorbidities is felt to place them at high risk for further clinical deterioration. Furthermore, it is not anticipated that the patient will be medically stable for discharge from the hospital within 2 midnights of admission.   * I certify that at the point of admission it is my clinical judgment that the patient will require inpatient hospital care spanning beyond 2 midnights from the point of admission due to high intensity of service, high risk for further deterioration and high frequency of surveillance required.*  Author: Lanetta Pion, MD 02/12/2024 12:00 AM  For on call review [email or web address].

## 2024-02-12 NOTE — Transfer of Care (Signed)
 Immediate Anesthesia Transfer of Care Note  Patient: Derrick Blanchard  Procedure(s) Performed: AMPUTATION BELOW KNEE (Left: Knee)  Patient Location: PACU  Anesthesia Type:General  Level of Consciousness: drowsy  Airway & Oxygen Therapy: Patient Spontanous Breathing and Patient connected to face mask oxygen  Post-op Assessment: Report given to RN and Post -op Vital signs reviewed and stable  Post vital signs: Reviewed and stable  Last Vitals:  Vitals Value Taken Time  BP 105/64 02/12/24 1545  Temp    Pulse 91 02/12/24 1547  Resp 18 02/12/24 1547  SpO2 100 % 02/12/24 1547  Vitals shown include unfiled device data.  Last Pain:  Vitals:   02/12/24 1157  TempSrc: Temporal  PainSc: 6       Patients Stated Pain Goal: 0 (02/12/24 1157)  Complications: No notable events documented.

## 2024-02-12 NOTE — Hospital Course (Signed)
 43 y.o. male with medical history significant for Being admitted with pain and bleeding from a left TMA for which he recently underwent revision at [facility] on 5/23, discharged on 02/08/2024.  Triage note states that he was brought in by EMS from the side of the road with a complaint that the wound was bleeding.  Patient has a history of alcohol use disorder and housing insecurity, depression, metALD with cirrhosis, portal hypertension and esophageal varices, chronic hepatitis C,  prior right TMA.  He had left TMA 12/2023 which was complicated by dehiscence for which she was hospitalized at [facility] from 5/12 to 01/23/2024.  BKA was recommended however he declined opting to go to [facility] for an opinion.  He was admitted to [facility] the following day on 5/21 where he remained until 6/5.  Wound cultures grew Proteus and Corynebacterium and he completed linezolid  and Augmentin  today 02/11/2024. Patient states he is in a lot of pain and he is now willing to undergo the previously recommended BKA. In the ED, vitals were within normal limits.  WBC 5.9 with lactic acid 2.2.  Hemoglobin at baseline at 8.3 with baseline thrombocytopenia of 60.  LFTs also at baseline.  X-rays of the left foot suspicious for osteomyelitis. Patient started on Zosyn.  Admission requested   6/9.  Patient seen by vascular surgery and will go to operating room for amputation. 6/10.  Hemoglobin 6.8.  Transfuse 1 unit of packed red blood cells.  Benefits and risk of transfusion explained.

## 2024-02-12 NOTE — H&P (View-Only) (Signed)
 Hospital Consult    Reason for Consult:  Left Lower Extremity Post Surgical Infection.  Requesting Physician:  Dr Verla Glaze MD MRN #:  [id number]  History of Present Illness: This is a 43 y.o. male  with medical history significant for Being admitted with pain and bleeding from a left TMA for which he recently underwent revision at [facility] on 5/23, discharged on 02/08/2024.  Triage note states that he was brought in by EMS from the side of the road with a complaint that the wound was bleeding.  Patient has a history of alcohol use disorder and housing insecurity, depression, metALD with cirrhosis, portal hypertension and esophageal varices, chronic hepatitis C,  prior right TMA.  He had left TMA 12/2023 which was complicated by dehiscence for which she was hospitalized at [facility] from 5/12 to 01/23/2024.  BKA was recommended however he declined opting to go to [facility] for an opinion.  He was admitted to [facility] the following day on 5/21 where he remained until 6/5.  Wound cultures grew Proteus and Corynebacterium and he completed linezolid  and Augmentin  today 02/11/2024. Patient states he is in a lot of pain and he is now willing to undergo the previously recommended BKA. Vascular Surgery consulted to provide left BKA.   Past Medical History:  Diagnosis Date   Anemia    Anxiety    Cirrhosis (HCC)    Depression    Heart murmur    Hepatitis    Hypertension    Substance abuse [facility])     Past Surgical History:  Procedure Laterality Date   AMPUTATION Left 12/29/2023   Procedure: AMPUTATION, FOOT, RAY;  Surgeon: Evertt Hoe, DPM;  Location: [facility] ORS;  Service: Orthopedics/Podiatry;  Laterality: Left;  Partial first ray amp, abx beads, graft application   COLONOSCOPY     ESOPHAGOGASTRODUODENOSCOPY (EGD) WITH PROPOFOL  N/A 02/03/2015   Procedure: ESOPHAGOGASTRODUODENOSCOPY (EGD) WITH PROPOFOL ;  Surgeon: Luella Sager, MD;  Location: [facility] ENDOSCOPY;  Service: Endoscopy;  Laterality: N/A;    ESOPHAGOGASTRODUODENOSCOPY (EGD) WITH PROPOFOL  N/A 12/21/2017   Procedure: ESOPHAGOGASTRODUODENOSCOPY (EGD) WITH PROPOFOL ;  Surgeon: Luke Salaam, MD;  Location: [facility] ENDOSCOPY;  Service: Gastroenterology;  Laterality: N/A;   FISSURECTOMY     IRRIGATION AND DEBRIDEMENT FOOT Left 12/16/2023   Procedure: IRRIGATION AND DEBRIDEMENT FOOT, possible sesamoidectomy, bone biopsy;  Surgeon: Reina Cara, DPM;  Location: [facility] ORS;  Service: Podiatry;  Laterality: Left;  Pulse lavage, bone biopsy with jam shidi needle, mini c arm, sagittal saw available   Right transmet amputation Right    TRANSMETATARSAL AMPUTATION Left 01/03/2024   Procedure: AMPUTATION, FOOT, TRANSMETATARSAL;  Surgeon: Evertt Hoe, DPM;  Location: [facility] ORS;  Service: Orthopedics/Podiatry;  Laterality: Left;  Left foot Trans met amputation    No Known Allergies  Prior to Admission medications   Medication Sig Start Date End Date Taking? Authorizing Provider  folic acid  (FOLVITE ) 1 MG tablet Take 1 tablet (1 mg total) by mouth daily. 01/23/24   Tiajuana Fluke, MD  lactulose  (CHRONULAC ) 10 GM/15ML solution Take 30 mLs (20 g total) by mouth daily. 01/24/24   Tiajuana Fluke, MD  Multiple Vitamin (MULTIVITAMIN WITH MINERALS) TABS tablet Take 1 tablet by mouth daily. 01/23/24   Tiajuana Fluke, MD  Oxycodone  HCl 10 MG TABS Take 1-2 tablets (10-20 mg total) by mouth every 3 (three) hours as needed for moderate pain (pain score 4-6) or severe pain (pain score 7-10). 01/23/24   Tiajuana Fluke, MD  pantoprazole  (PROTONIX ) 40 MG  tablet Take 1 tablet (40 mg total) by mouth daily. 01/23/24   Tiajuana Fluke, MD  thiamine  (VITAMIN B1) 100 MG tablet Take 1 tablet (100 mg total) by mouth daily. 01/23/24   Tiajuana Fluke, MD    Social History   Socioeconomic History   Marital status: Single    Spouse name: Not on file   Number of children: Not on file   Years of education: Not on file   Highest education level:  Not on file  Occupational History   Not on file  Tobacco Use   Smoking status: Some Days    Current packs/day: 0.25    Types: Cigarettes   Smokeless tobacco: Never  Vaping Use   Vaping status: Never Used  Substance and Sexual Activity   Alcohol use: Yes    Comment: since 10/03/17   Drug use: Yes    Types: Marijuana    Comment: 12/21/17   Sexual activity: Yes  Other Topics Concern   Not on file  Social History Narrative   Not on file   Social Drivers of Health   Financial Resource Strain: Medium Risk (01/25/2024)   Received from [facility]   Overall Financial Resource Strain (CARDIA)    Difficulty of Paying Living Expenses: Somewhat hard  Food Insecurity: Food Insecurity Present (02/12/2024)   Hunger Vital Sign    Worried About Running Out of Food in the Last Year: Often true    Ran Out of Food in the Last Year: Often true  Transportation Needs: Unmet Transportation Needs (02/12/2024)   PRAPARE - Administrator, Civil Service (Medical): Yes    Lack of Transportation (Non-Medical): No  Physical Activity: Not on file  Stress: Not on file  Social Connections: Unknown (01/15/2024)   Social Connection and Isolation Panel [NHANES]    Frequency of Communication with Friends and Family: Patient declined    Frequency of Social Gatherings with Friends and Family: Patient declined    Attends Religious Services: Patient declined    Database administrator or Organizations: Patient declined    Attends Banker Meetings: Patient declined    Marital Status: Never married  Intimate Partner Violence: Not At Risk (02/12/2024)   Humiliation, Afraid, Rape, and Kick questionnaire    Fear of Current or Ex-Partner: No    Emotionally Abused: No    Physically Abused: No    Sexually Abused: No     Family History  Problem Relation Age of Onset   Lung cancer Mother    Cirrhosis Father    Lung cancer Paternal Grandfather     ROS: Otherwise negative  unless mentioned in HPI  Physical Examination  Vitals:   02/12/24 0404 02/12/24 0801  BP: 110/70 118/65  Pulse: 76 67  Resp: 18 16  Temp: 98.5 F (36.9 C) 98.4 F (36.9 C)  SpO2: 100% 100%   Body mass index is 34.98 kg/m.  General:  WDWN in NAD Gait: Not observed HENT: WNL, normocephalic Pulmonary: normal non-labored breathing, without Rales, rhonchi,  wheezing Cardiac: regular, without  Murmurs, rubs or gallops; without carotid bruits Abdomen: Positive bowel sounds,  soft, NT/ND, no masses Skin: without rashes Vascular Exam/Pulses: Palpable Pulses throughout, All extremities are warm to touch.  Extremities: with ischemic changes, without Gangrene , with cellulitis; with open wounds; Left post operative TMA non healing surgical wound.  Musculoskeletal: no muscle wasting or atrophy  Neurologic: A&O X 3;  No focal weakness or paresthesias are detected;  speech is fluent/normal Psychiatric:  The pt has Normal affect. Lymph:  Unremarkable  CBC    Component Value Date/Time   WBC 5.1 02/12/2024 0127   RBC 3.07 (L) 02/12/2024 0127   HGB 8.0 (L) 02/12/2024 0127   HGB 12.1 (L) 09/20/2013 1624   HCT 25.7 (L) 02/12/2024 0127   HCT 36.8 (L) 09/20/2013 1624   PLT 60 (L) 02/12/2024 0127   PLT 97 (L) 09/20/2013 1624   MCV 83.7 02/12/2024 0127   MCV 92 09/20/2013 1624   MCH 26.1 02/12/2024 0127   MCHC 31.1 02/12/2024 0127   RDW 20.6 (H) 02/12/2024 0127   RDW 14.0 09/20/2013 1624   LYMPHSABS 0.8 02/11/2024 1822   LYMPHSABS 1.2 09/20/2013 1624   MONOABS 0.6 02/11/2024 1822   MONOABS 0.5 09/20/2013 1624   EOSABS 0.0 02/11/2024 1822   EOSABS 0.1 09/20/2013 1624   BASOSABS 0.0 02/11/2024 1822   BASOSABS 0.0 09/20/2013 1624    BMET    Component Value Date/Time   NA 146 (H) 02/12/2024 0127   NA 144 11/24/2017 1025   NA 138 02/12/2013 0508   K 3.3 (L) 02/12/2024 0127   K 4.7 02/12/2013 0508   CL 117 (H) 02/12/2024 0127   CL 105 02/12/2013 0508   CO2 22 02/12/2024 0127    CO2 27 02/12/2013 0508   GLUCOSE 122 (H) 02/12/2024 0127   GLUCOSE 107 (H) 02/12/2013 0508   BUN 7 02/12/2024 0127   BUN 5 (L) 11/24/2017 1025   BUN 16 02/12/2013 0508   CREATININE 0.62 02/12/2024 0127   CREATININE 0.51 (L) 02/12/2013 0508   CALCIUM  8.5 (L) 02/12/2024 0127   CALCIUM  8.3 (L) 02/12/2013 0508   GFRNONAA >60 02/12/2024 0127   GFRNONAA >60 02/12/2013 0508   GFRAA >60 11/07/2018 0153   GFRAA >60 02/12/2013 0508    COAGS: Lab Results  Component Value Date   INR 1.4 (H) 12/13/2023   INR 1.5 (H) 03/10/2023   INR 1.63 10/24/2017     Non-Invasive Vascular Imaging:   EXAM: LEFT FOOT - COMPLETE 3+ VIEW   COMPARISON:  01/06/2024   FINDINGS: There has been interval progressive removal of the metatarsal bases with small bony fragments identified. The margins appear irregular suspicious for underlying osteomyelitis. Soft tissue swelling the stump is noted.   IMPRESSION: Interval progressive resection of the metatarsal bases. The margins appear mildly irregular suspicious for osteomyelitis.  Statin:  No. Beta Blocker:  No. Aspirin:  No. ACEI:  No. ARB:  No. CCB use:  No Other antiplatelets/anticoagulants:  No.    ASSESSMENT/PLAN: This is a 43 y.o. male presents to [facility] emergency department with an infected left postoperative TMA he had at [facility].  Wound cultures grew Proteus and Cornia bacterium even while being on linezolid .  Patient's lactic acid levels are up to 3.2 today.  Plan is amputation of left lower extremity below the knee.  I discussed in detail this morning at the bedside with the patient the procedure, benefits, risk, and complications.  Patient verbalizes understanding and wishes to proceed.  Answered all his questions this morning.  Patient endorses he has been n.p.o. since midnight last night.  Plan is for left lower extremity below the knee amputation later today.  I discussed the case with Dr. Mikki Alexander MD and he agrees with plan.   Annamaria Barrette [facility] 02/12/2024 8:19 AM

## 2024-02-12 NOTE — H&P (Incomplete)
 History and Physical    Patient: Derrick Blanchard WUJ:[id number] DOB: 10-Apr-1981 DOA: 02/11/2024 DOS: the patient was seen and examined on 02/12/2024 PCP: Patient, No Pcp Per  Patient coming from: Homeless  Chief Complaint:  Chief Complaint  Patient presents with  . Wound Check  . Alcohol Intoxication    HPI: Derrick Blanchard is a 43 y.o. male with medical history significant for Being admitted with pain and bleeding from a left TMA for which he recently underwent revision at [facility] on 5/23, discharged on 02/08/2024.  Triage note states that he was brought in by EMS from the side of the road with a complaint that the wound was bleeding.  Patient has a history of alcohol use disorder and housing insecurity, depression, metALD with cirrhosis, portal hypertension and esophageal varices, chronic hepatitis C,  prior right TMA.  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  BKA was recommended however he declined opting to go to [facility] for an opinion.  He was admitted to [facility] the following day on 5/21 where he remained until 6/5.  Wound cultures grew Proteus and Corynebacterium and he completed linezolid  and Augmentin  today 02/11/2024. Patient states he is in a lot of pain and he is now willing to undergo the previously recommended BKA. In the ED, vitals were within normal limits.  WBC 5.9 with lactic acid 2.2.  Hemoglobin at baseline at 8.3 with baseline thrombocytopenia of 60.  LFTs also at baseline.  X-rays of the left foot suspicious for osteomyelitis. Patient started on Zosyn.  Admission requested     Review of Systems: As mentioned in the history of present illness. All other systems reviewed and are negative.  Past Medical History:  Diagnosis Date  . Anemia   . Anxiety   . Cirrhosis (HCC)   . Depression   . Heart murmur   . Hepatitis   . Hypertension   . Substance abuse [facility])    Past Surgical History:   Procedure Laterality Date  . AMPUTATION Left 12/29/2023   Procedure: AMPUTATION, FOOT, RAY;  Surgeon: Evertt Hoe, DPM;  Location: [facility] ORS;  Service: Orthopedics/Podiatry;  Laterality: Left;  Partial first ray amp, abx beads, graft application  . COLONOSCOPY    . ESOPHAGOGASTRODUODENOSCOPY (EGD) WITH PROPOFOL  N/A 02/03/2015   Procedure: ESOPHAGOGASTRODUODENOSCOPY (EGD) WITH PROPOFOL ;  Surgeon: Luella Sager, MD;  Location: [facility] ENDOSCOPY;  Service: Endoscopy;  Laterality: N/A;  . ESOPHAGOGASTRODUODENOSCOPY (EGD) WITH PROPOFOL  N/A 12/21/2017   Procedure: ESOPHAGOGASTRODUODENOSCOPY (EGD) WITH PROPOFOL ;  Surgeon: Luke Salaam, MD;  Location: [facility] ENDOSCOPY;  Service: Gastroenterology;  Laterality: N/A;  . FISSURECTOMY    . IRRIGATION AND DEBRIDEMENT FOOT Left 12/16/2023   Procedure: IRRIGATION AND DEBRIDEMENT FOOT, possible sesamoidectomy, bone biopsy;  Surgeon: Reina Cara, DPM;  Location: [facility] ORS;  Service: Podiatry;  Laterality: Left;  Pulse lavage, bone biopsy with jam shidi needle, mini c arm, sagittal saw available  . Right transmet amputation Right   . TRANSMETATARSAL AMPUTATION Left 01/03/2024   Procedure: AMPUTATION, FOOT, TRANSMETATARSAL;  Surgeon: Evertt Hoe, DPM;  Location: [facility] ORS;  Service: Orthopedics/Podiatry;  Laterality: Left;  Left foot Trans met amputation   Social History:  reports that he has been smoking cigarettes. He has never used smokeless tobacco. He reports current alcohol use. He reports current drug use. Drug: Marijuana.  No Known Allergies  Family History  Problem Relation Age of Onset  . Lung cancer  Mother   . Cirrhosis Father   . Lung cancer Paternal Grandfather     Prior to Admission medications   Medication Sig Start Date End Date Taking? Authorizing Provider  folic acid  (FOLVITE ) 1 MG tablet Take 1 tablet (1 mg total) by mouth daily. 01/23/24   Tiajuana Fluke, MD  lactulose  (CHRONULAC ) 10 GM/15ML solution Take 30  mLs (20 g total) by mouth daily. 01/24/24   Tiajuana Fluke, MD  Multiple Vitamin (MULTIVITAMIN WITH MINERALS) TABS tablet Take 1 tablet by mouth daily. 01/23/24   Tiajuana Fluke, MD  Oxycodone  HCl 10 MG TABS Take 1-2 tablets (10-20 mg total) by mouth every 3 (three) hours as needed for moderate pain (pain score 4-6) or severe pain (pain score 7-10). 01/23/24   Tiajuana Fluke, MD  pantoprazole  (PROTONIX ) 40 MG tablet Take 1 tablet (40 mg total) by mouth daily. 01/23/24   Tiajuana Fluke, MD  thiamine  (VITAMIN B1) 100 MG tablet Take 1 tablet (100 mg total) by mouth daily. 01/23/24   Tiajuana Fluke, MD    Physical Exam: Vitals:   02/11/24 1809 02/11/24 2100 02/11/24 2117 02/11/24 2231  BP:  111/68    Pulse:  68    Resp:  (!) 22    Temp:    98.6 F (37 C)  TempSrc:    Oral  SpO2:  100%    Weight:   101.3 kg   Height: 5\' 7"  (1.702 m)      Physical Exam  Labs on Admission: I have personally reviewed following labs and imaging studies  CBC: Recent Labs  Lab 02/11/24 1822  WBC 5.9  NEUTROABS 4.5  HGB 8.3*  HCT 26.2*  MCV 82.6  PLT 60*   Basic Metabolic Panel: Recent Labs  Lab 02/11/24 1822  NA 144  K 3.6  CL 114*  CO2 20*  GLUCOSE 119*  BUN 7  CREATININE 0.57*  CALCIUM  8.6*   GFR: Estimated Creatinine Clearance: 139.2 mL/min (A) (by C-G formula based on SCr of 0.57 mg/dL (L)). Liver Function Tests: Recent Labs  Lab 02/11/24 1822  AST 43*  ALT 19  ALKPHOS 73  BILITOT 1.6*  PROT 6.6  ALBUMIN 3.2*   No results for input(s): "LIPASE", "AMYLASE" in the last 168 hours. No results for input(s): "AMMONIA" in the last 168 hours. Coagulation Profile: No results for input(s): "INR", "PROTIME" in the last 168 hours. Cardiac Enzymes: No results for input(s): "CKTOTAL", "CKMB", "CKMBINDEX", "TROPONINI" in the last 168 hours. BNP (last 3 results) No results for input(s): "PROBNP" in the last 8760 hours. HbA1C: No results for input(s): "HGBA1C" in the  last 72 hours. CBG: No results for input(s): "GLUCAP" in the last 168 hours. Lipid Profile: No results for input(s): "CHOL", "HDL", "LDLCALC", "TRIG", "CHOLHDL", "LDLDIRECT" in the last 72 hours. Thyroid Function Tests: No results for input(s): "TSH", "T4TOTAL", "FREET4", "T3FREE", "THYROIDAB" in the last 72 hours. Anemia Panel: No results for input(s): "VITAMINB12", "FOLATE", "FERRITIN", "TIBC", "IRON", "RETICCTPCT" in the last 72 hours. Urine analysis:    Component Value Date/Time   COLORURINE YELLOW (A) 03/11/2023 0003   APPEARANCEUR CLEAR (A) 03/11/2023 0003   APPEARANCEUR Clear 02/01/2013 1851   LABSPEC 1.006 03/11/2023 0003   LABSPEC 1.015 02/01/2013 1851   PHURINE 6.0 03/11/2023 0003   GLUCOSEU NEGATIVE 03/11/2023 0003   GLUCOSEU 50 mg/dL 16/06/9603 5409   HGBUR NEGATIVE 03/11/2023 0003   BILIRUBINUR NEGATIVE 03/11/2023 0003   BILIRUBINUR 2+ 02/01/2013 1851   KETONESUR NEGATIVE 03/11/2023  0003   PROTEINUR NEGATIVE 03/11/2023 0003   NITRITE NEGATIVE 03/11/2023 0003   LEUKOCYTESUR NEGATIVE 03/11/2023 0003   LEUKOCYTESUR Negative 02/01/2013 1851    Radiological Exams on Admission: DG Foot Complete Left Result Date: 02/11/2024 CLINICAL DATA:  Persistent pain following transmetatarsal amputation EXAM: LEFT FOOT - COMPLETE 3+ VIEW COMPARISON:  01/06/2024 FINDINGS: There has been interval progressive removal of the metatarsal bases with small bony fragments identified. The margins appear irregular suspicious for underlying osteomyelitis. Soft tissue swelling the stump is noted. IMPRESSION: Interval progressive resection of the metatarsal bases. The margins appear mildly irregular suspicious for osteomyelitis. Electronically Signed   By: Violeta Grey M.D.   On: 02/11/2024 19:57   Data Reviewed for HPI: Relevant notes from primary care and specialist visits, past discharge summaries as available in EHR, including Care Everywhere. Prior diagnostic testing as pertinent to current  admission diagnoses Updated medications and problem lists for reconciliation ED course, including vitals, labs, imaging, treatment and response to treatment Triage notes, nursing and pharmacy notes and ED provider's notes Notable results as noted above in HPI      Assessment and Plan: No notes have been filed under this hospital service. Service: Hospitalist L TMA dehiscence with underlying osteomyelitis s/p revision 01/26/24 at [facility] Recent OR tissue cultures with Proteus and Corynebacterium  Completed linezolid  and Augmentin  on 6/8 rare growth proteus mirabilis and rare growth corynebacterium striatum and anaerobes. Proteus is pansensitive. Also had VRE in OSH OR culture which is sensitive to linezolid . Followed by ID during hospital course. Initially on dapto/zosyn then switched to linezolid  + augmentin  x 14 days (end date 6/08).  NWB except pivots and transfers to heel with assitsive devices. Splint to remain in place. Wound care as per podiatry. Follow up with podiatry arranged for 6/18. Provided with a short oxycodone  rx for acute post operative pain.  # Pancytopenia Likely due to underlying liver disease and alcohol abuse. Monitored throughout hospital course.   # Severe alcohol use disorder Complicates all aspects of his care. Counseled regarding need for cessation. No signs of withdrawal this hospital admission.  # Cirrhosis Complicated by edema, varices, pancytopenia. Counseled regarding alcohol cessation. Initially started lasix  and spironolactone on admission - however minimal edema currently and patient has been having ~3L of UOP daily so will hold for now and closely monitor exam. He has not developed any significant edema, so diuretics were not restarted. No issues with encephalopathy this admission.  # Chronic HCV HCV PCR positive for low level viremia. Per ID, will need fibroscan outpatient before initiation of HCV therapy. ID follow up arranged to discuss.    Alcohol  use disorder, severe, dependence (HCC) Patient has an extensive history of alcohol use disorder complicated by cirrhosis.  He notes that he used to drink half a gallon of liquor per day, but now with drinks several 40 ounces or up to 15 beers.  He is in active withdrawal at this time, with high risk for DT.   - Telemetry monitoring - Start CIWA with Ativan  as needed - Start Librium  taper - Daily folic acid , thiamine  and multivitamin   Alcoholic cirrhosis of liver (HCC) Long-term history of alcohol induced cirrhosis complicated by pancytopenia, portal hypertension gastropathy.  No history of ascites, not currently on diuretics.   Pancytopenia (HCC) Cell lines are currently at baseline.    - Daily CBC       DVT prophylaxis: Lovenox ***  Consults: none***  Advance Care Planning:   Code Status: Full Code ***  Family Communication: none***  Disposition Plan: Back to previous home environment  Severity of Illness: {Observation/Inpatient:21159}  Author: Lanetta Pion, MD 02/12/2024 12:00 AM  For on call review [email or web address].

## 2024-02-12 NOTE — Assessment & Plan Note (Signed)
 Continue to monitor.  Could be secondary to infection.  Received a fluid bolus.

## 2024-02-12 NOTE — Assessment & Plan Note (Signed)
 Follow up as outpatient.

## 2024-02-12 NOTE — Assessment & Plan Note (Signed)
 History of portal hypertension and esophageal varices.

## 2024-02-12 NOTE — Assessment & Plan Note (Signed)
 Secondary to liver disease.  Continue to monitor closely may end up needing another unit of platelets.  Received platelets prior to amputation.

## 2024-02-12 NOTE — Assessment & Plan Note (Signed)
 Resolved

## 2024-02-12 NOTE — Anesthesia Preprocedure Evaluation (Signed)
 Anesthesia Evaluation  Patient identified by MRN, date of birth, ID band Patient awake    Reviewed: Allergy & Precautions, NPO status , Patient's Chart, lab work & pertinent test results  History of Anesthesia Complications Negative for: history of anesthetic complications  Airway Mallampati: III  TM Distance: >3 FB Neck ROM: full    Dental  (+) Chipped, Poor Dentition   Pulmonary neg pulmonary ROS, Current Smoker and Patient abstained from smoking.   Pulmonary exam normal        Cardiovascular hypertension, On Medications negative cardio ROS Normal cardiovascular exam     Neuro/Psych  PSYCHIATRIC DISORDERS Anxiety Depression    negative neurological ROS     GI/Hepatic ,GERD  Medicated,,(+) Cirrhosis   Esophageal Varices and ascites  substance abuse  alcohol use, Hepatitis -, C  Endo/Other  negative endocrine ROS    Renal/GU negative Renal ROS     Musculoskeletal   Abdominal   Peds  Hematology  (+) Blood dyscrasia, anemia   Anesthesia Other Findings Past Medical History: No date: Anemia No date: Anxiety No date: Cirrhosis (HCC) No date: Depression No date: Heart murmur No date: Hepatitis No date: Hypertension No date: Substance abuse [facility])  Past Surgical History: 12/29/2023: AMPUTATION; Left     Comment:  Procedure: AMPUTATION, FOOT, RAY;  Surgeon: Evertt Hoe, DPM;  Location: [facility] ORS;  Service:               Orthopedics/Podiatry;  Laterality: Left;  Partial first               ray amp, abx beads, graft application No date: COLONOSCOPY 02/03/2015: ESOPHAGOGASTRODUODENOSCOPY (EGD) WITH PROPOFOL ; N/A     Comment:  Procedure: ESOPHAGOGASTRODUODENOSCOPY (EGD) WITH               PROPOFOL ;  Surgeon: Luella Sager, MD;  Location:               [facility] ENDOSCOPY;  Service: Endoscopy;  Laterality: N/A; 12/21/2017: ESOPHAGOGASTRODUODENOSCOPY (EGD) WITH PROPOFOL ; N/A     Comment:   Procedure: ESOPHAGOGASTRODUODENOSCOPY (EGD) WITH               PROPOFOL ;  Surgeon: Luke Salaam, MD;  Location: [facility]               ENDOSCOPY;  Service: Gastroenterology;  Laterality: N/A; No date: FISSURECTOMY 12/16/2023: IRRIGATION AND DEBRIDEMENT FOOT; Left     Comment:  Procedure: IRRIGATION AND DEBRIDEMENT FOOT, possible               sesamoidectomy, bone biopsy;  Surgeon: Reina Cara,               DPM;  Location: [facility] ORS;  Service: Podiatry;                Laterality: Left;  Pulse lavage, bone biopsy with jam               shidi needle, mini c arm, sagittal saw available No date: Right transmet amputation; Right 01/03/2024: TRANSMETATARSAL AMPUTATION; Left     Comment:  Procedure: AMPUTATION, FOOT, TRANSMETATARSAL;  Surgeon:               Evertt Hoe, DPM;  Location: [facility] ORS;                Service: Orthopedics/Podiatry;  Laterality: Left;  Left  foot Trans met amputation  BMI    Body Mass Index: 34.98 kg/m      Reproductive/Obstetrics negative OB ROS                             Anesthesia Physical Anesthesia Plan  ASA: 3  Anesthesia Plan: General LMA   Post-op Pain Management: Ofirmev  IV (intra-op)*, Toradol  IV (intra-op)* and Dilaudid  IV   Induction: Intravenous  PONV Risk Score and Plan: 2 and Dexamethasone , Ondansetron , Midazolam  and Treatment may vary due to age or medical condition  Airway Management Planned: LMA  Additional Equipment:   Intra-op Plan:   Post-operative Plan: Extubation in OR  Informed Consent: I have reviewed the patients History and Physical, chart, labs and discussed the procedure including the risks, benefits and alternatives for the proposed anesthesia with the patient or authorized representative who has indicated his/her understanding and acceptance.     Dental Advisory Given  Plan Discussed with: Anesthesiologist, CRNA and Surgeon  Anesthesia Plan Comments: (Patient consented  for risks of anesthesia including but not limited to:  - adverse reactions to medications - damage to eyes, teeth, lips or other oral mucosa - nerve damage due to positioning  - sore throat or hoarseness - Damage to heart, brain, nerves, lungs, other parts of body or loss of life  Patient voiced understanding and assent.)       Anesthesia Quick Evaluation

## 2024-02-12 NOTE — Assessment & Plan Note (Signed)
 Continue to monitor blood counts especially postoperative.

## 2024-02-12 NOTE — Assessment & Plan Note (Signed)
 Continue folate thiamine  and multivitamin.

## 2024-02-12 NOTE — Assessment & Plan Note (Signed)
 Replaced

## 2024-02-12 NOTE — Assessment & Plan Note (Signed)
 Pain control.

## 2024-02-12 NOTE — Interval H&P Note (Signed)
 History and Physical Interval Note:  02/12/2024 10:49 AM  Derrick Blanchard  has presented today for surgery, with the diagnosis of Infection with TMA infection.  The various methods of treatment have been discussed with the patient and family. After consideration of risks, benefits and other options for treatment, the patient has consented to  Procedure(s): AMPUTATION BELOW KNEE (Left) as a surgical intervention.  The patient's history has been reviewed, patient examined, no change in status, stable for surgery.  I have reviewed the patient's chart and labs.  Questions were answered to the patient's satisfaction.     [name]

## 2024-02-12 NOTE — Anesthesia Procedure Notes (Signed)
 Procedure Name: Intubation Date/Time: 02/12/2024 1:58 PM  Performed by: Darlen Eglin, Gelisa Tieken, CRNAPre-anesthesia Checklist: Patient identified, Emergency Drugs available, Suction available and Patient being monitored Patient Re-evaluated:Patient Re-evaluated prior to induction Oxygen Delivery Method: Circle system utilized Preoxygenation: Pre-oxygenation with 100% oxygen Induction Type: IV induction Ventilation: Mask ventilation without difficulty Laryngoscope Size: McGrath and 4 Grade View: Grade I Tube type: Oral Tube size: 7.0 mm Number of attempts: 1 Airway Equipment and Method: Stylet and Oral airway Placement Confirmation: ETT inserted through vocal cords under direct vision, positive ETCO2 and breath sounds checked- equal and bilateral Secured at: 22 cm Tube secured with: Tape Dental Injury: Teeth and Oropharynx as per pre-operative assessment

## 2024-02-13 ENCOUNTER — Encounter: Payer: Self-pay | Admitting: Vascular Surgery

## 2024-02-13 DIAGNOSIS — M86172 Other acute osteomyelitis, left ankle and foot: Secondary | ICD-10-CM

## 2024-02-13 DIAGNOSIS — M79605 Pain in left leg: Secondary | ICD-10-CM | POA: Diagnosis not present

## 2024-02-13 DIAGNOSIS — D62 Acute posthemorrhagic anemia: Secondary | ICD-10-CM | POA: Diagnosis not present

## 2024-02-13 DIAGNOSIS — E669 Obesity, unspecified: Secondary | ICD-10-CM | POA: Insufficient documentation

## 2024-02-13 DIAGNOSIS — E872 Acidosis, unspecified: Secondary | ICD-10-CM | POA: Diagnosis not present

## 2024-02-13 LAB — BPAM PLATELET PHERESIS
Blood Product Expiration Date: 202506112359
ISSUE DATE / TIME: 202506091434
Unit Type and Rh: 6200

## 2024-02-13 LAB — PREPARE PLATELET PHERESIS: Unit division: 0

## 2024-02-13 LAB — CBC
HCT: 21.5 % — ABNORMAL LOW (ref 39.0–52.0)
HCT: 20.8 % — ABNORMAL LOW (ref 39.0–52.0)
Hemoglobin: 6.9 g/dL — ABNORMAL LOW (ref 13.0–17.0)
Hemoglobin: 6.8 g/dL — ABNORMAL LOW (ref 13.0–17.0)
MCH: 25.8 pg — ABNORMAL LOW (ref 26.0–34.0)
MCH: 26.2 pg (ref 26.0–34.0)
MCHC: 32.1 g/dL (ref 30.0–36.0)
MCHC: 32.7 g/dL (ref 30.0–36.0)
MCV: 80.5 fL (ref 80.0–100.0)
MCV: 80 fL (ref 80.0–100.0)
Platelets: 50 10*3/uL — ABNORMAL LOW (ref 150–400)
Platelets: 46 10*3/uL — ABNORMAL LOW (ref 150–400)
RBC: 2.67 MIL/uL — ABNORMAL LOW (ref 4.22–5.81)
RBC: 2.6 MIL/uL — ABNORMAL LOW (ref 4.22–5.81)
RDW: 18.3 % — ABNORMAL HIGH (ref 11.5–15.5)
RDW: 18.1 % — ABNORMAL HIGH (ref 11.5–15.5)
WBC: 4.6 10*3/uL (ref 4.0–10.5)
WBC: 4.5 10*3/uL (ref 4.0–10.5)
nRBC: 0 % (ref 0.0–0.2)
nRBC: 0 % (ref 0.0–0.2)

## 2024-02-13 LAB — BASIC METABOLIC PANEL WITH GFR
Anion gap: 8 (ref 5–15)
BUN: 14 mg/dL (ref 6–20)
CO2: 20 mmol/L — ABNORMAL LOW (ref 22–32)
Calcium: 8.2 mg/dL — ABNORMAL LOW (ref 8.9–10.3)
Chloride: 109 mmol/L (ref 98–111)
Creatinine, Ser: 0.79 mg/dL (ref 0.61–1.24)
GFR, Estimated: >60 mL/min (ref >60)
Glucose, Bld: 129 mg/dL — ABNORMAL HIGH (ref 70–99)
Potassium: 4.4 mmol/L (ref 3.5–5.1)
Sodium: 137 mmol/L (ref 135–145)

## 2024-02-13 LAB — PREPARE RBC (CROSSMATCH)

## 2024-02-13 MED ORDER — CALCIUM CARBONATE ANTACID 500 MG PO CHEW
1.0000 | CHEWABLE_TABLET | Freq: Three times a day (TID) | ORAL | Status: DC | PRN
  Administered 2024-02-17: 200 mg via ORAL
  Filled 2024-02-13: qty 1

## 2024-02-13 MED ORDER — SODIUM CHLORIDE 0.9% IV SOLUTION
Freq: Once | INTRAVENOUS | Status: DC
Start: 2024-02-13 — End: 2024-02-23

## 2024-02-13 MED ORDER — OXYCODONE HCL 5 MG PO TABS
10.0000 mg | ORAL_TABLET | ORAL | Status: DC | PRN
  Administered 2024-02-13 – 2024-02-21 (×35): 20 mg via ORAL
  Administered 2024-02-21: 10 mg via ORAL
  Administered 2024-02-21 – 2024-02-22 (×3): 20 mg via ORAL
  Administered 2024-02-22: 15 mg via ORAL
  Administered 2024-02-22 – 2024-02-23 (×6): 20 mg via ORAL
  Filled 2024-02-13 (×20): qty 4
  Filled 2024-02-13: qty 2
  Filled 2024-02-13 (×25): qty 4

## 2024-02-13 MED ORDER — HYDROMORPHONE HCL 1 MG/ML IJ SOLN
0.5000 mg | INTRAMUSCULAR | Status: DC | PRN
  Administered 2024-02-13 – 2024-02-16 (×7): 0.5 mg via INTRAVENOUS
  Filled 2024-02-13 (×8): qty 0.5

## 2024-02-13 NOTE — Anesthesia Postprocedure Evaluation (Signed)
 Anesthesia Post Note  Patient: Derrick Blanchard  Procedure(s) Performed: AMPUTATION BELOW KNEE (Left: Knee)  Patient location during evaluation: PACU Anesthesia Type: General Level of consciousness: awake and alert Pain management: pain level controlled Vital Signs Assessment: post-procedure vital signs reviewed and stable Respiratory status: spontaneous breathing, nonlabored ventilation, respiratory function stable and patient connected to nasal cannula oxygen Cardiovascular status: blood pressure returned to baseline and stable Postop Assessment: no apparent nausea or vomiting Anesthetic complications: no   No notable events documented.   Last Vitals:  Vitals:   02/13/24 0322 02/13/24 0735  BP: 118/66 116/68  Pulse: 81 83  Resp: 18 16  Temp: 36.9 C (!) 36.4 C  SpO2: 99% 98%    Last Pain:  Vitals:   02/13/24 0735  TempSrc: Oral  PainSc:                  Nancey Awkward

## 2024-02-13 NOTE — Progress Notes (Signed)
 PT Cancellation Note  Patient Details Name: Derrick Blanchard MRN: [id number] DOB: [birth year]   Cancelled Treatment:    Reason Eval/Treat Not Completed: Patient not medically ready: Pt's most recent Hgb 6.8.  Per MD pt receiving transfusion this AM and ok to work with pt in the afternoon this date after transfusion completed.  Will attempt to see pt at a future date/time as medically appropriate.    Lavenia Post PT, DPT 02/13/24, 9:31 AM

## 2024-02-13 NOTE — Progress Notes (Signed)
 Progress Note    02/13/2024 8:24 AM 1 Day Post-Op  Subjective:  Derrick Blanchard is a 43 yo male now POD #1 from left BKA.  Patient complains of pain this morning at the site.  Dressing is clean dry and intact.  No complaints overnight vitals are remained stable.   Vitals:   02/13/24 0322 02/13/24 0735  BP: 118/66 116/68  Pulse: 81 83  Resp: 18 16  Temp: 98.4 F (36.9 C) (!) 97.5 F (36.4 C)  SpO2: 99% 98%   Physical Exam: Cardiac:  RRR, normal S1 and S2.  No rubs clicks gallops or murmurs Lungs: Clear throughout on auscultation.  No rales rhonchi or wheezing. Incisions: Left BKA incision clean dry and intact. Extremities: Right lower extremity warm with palpable pulses.  Left lower extremity now with BKA. Abdomen: Positive bowel sounds throughout, soft, nontender nondistended. Neurologic: Alert and oriented x 4, answers all questions and follows commands appropriately  CBC    Component Value Date/Time   WBC 4.5 02/13/2024 0620   RBC 2.60 (L) 02/13/2024 0620   HGB 6.8 (L) 02/13/2024 0620   HGB 12.1 (L) 09/20/2013 1624   HCT 20.8 (L) 02/13/2024 0620   HCT 36.8 (L) 09/20/2013 1624   PLT 46 (L) 02/13/2024 0620   PLT 97 (L) 09/20/2013 1624   MCV 80.0 02/13/2024 0620   MCV 92 09/20/2013 1624   MCH 26.2 02/13/2024 0620   MCHC 32.7 02/13/2024 0620   RDW 18.1 (H) 02/13/2024 0620   RDW 14.0 09/20/2013 1624   LYMPHSABS 0.8 02/11/2024 1822   LYMPHSABS 1.2 09/20/2013 1624   MONOABS 0.6 02/11/2024 1822   MONOABS 0.5 09/20/2013 1624   EOSABS 0.0 02/11/2024 1822   EOSABS 0.1 09/20/2013 1624   BASOSABS 0.0 02/11/2024 1822   BASOSABS 0.0 09/20/2013 1624    BMET    Component Value Date/Time   NA 137 02/13/2024 0415   NA 144 11/24/2017 1025   NA 138 02/12/2013 0508   K 4.4 02/13/2024 0415   K 4.7 02/12/2013 0508   CL 109 02/13/2024 0415   CL 105 02/12/2013 0508   CO2 20 (L) 02/13/2024 0415   CO2 27 02/12/2013 0508   GLUCOSE 129 (H) 02/13/2024 0415   GLUCOSE 107 (H)  02/12/2013 0508   BUN 14 02/13/2024 0415   BUN 5 (L) 11/24/2017 1025   BUN 16 02/12/2013 0508   CREATININE 0.79 02/13/2024 0415   CREATININE 0.51 (L) 02/12/2013 0508   CALCIUM  8.2 (L) 02/13/2024 0415   CALCIUM  8.3 (L) 02/12/2013 0508   GFRNONAA >60 02/13/2024 0415   GFRNONAA >60 02/12/2013 0508   GFRAA >60 11/07/2018 0153   GFRAA >60 02/12/2013 0508    INR    Component Value Date/Time   INR 1.4 (H) 12/13/2023 1329   INR 1.4 02/07/2013 0137     Intake/Output Summary (Last 24 hours) at 02/13/2024 0824 Last data filed at 02/13/2024 0430 Gross per 24 hour  Intake 3484.77 ml  Output 500 ml  Net 2984.77 ml     Assessment/Plan:  43 y.o. male is s/p left BKA 1 Day Post-Op   PLAN Pain medication as needed Darolyn Elks diet as tolerated PT OT consults. First dressing change to be done by vascular surgery on Thursday, 02/15/2024.  Patient to be discharged to rehab after first dressing change. Patient's hemoglobin was noted to be 6.9 this morning.  Repeat CBC is being done.  If patient's hemoglobin is less than 7 we will transfuse with 1 unit packed red  blood cells.  DVT prophylaxis: Lovenox  on hold this morning due to low hemoglobin   Annamaria Barrette [facility] 02/13/2024 8:24 AM

## 2024-02-13 NOTE — Progress Notes (Signed)
 Patient was admitted on 6/8 for left foot osteomyelitis. Patient is now post op day 1 from a Left BKA. CBC this morning showed a drop in his Hemoglobin from 9.0 to 6.9. CBC is currently being repeated. However, nurse reached to on-call covering provider and will await repeat CBC results before placing any orders. Will pass along to oncoming nurse for follow-up results.   Charge nurse also aware.

## 2024-02-13 NOTE — Progress Notes (Signed)
 PT Cancellation Note  Patient Details Name: Derrick Blanchard MRN: [id number] DOB: October 06, 1980   Cancelled Treatment:    Reason Eval/Treat Not Completed: Patient not medically ready: Per nursing pt continues to await blood transfusion this date.  Will attempt to see pt at a future date/time as medically appropriate.    Lavenia Post PT, DPT 02/13/24, 2:23 PM

## 2024-02-13 NOTE — Assessment & Plan Note (Signed)
 Hemoglobin went from 9.0 down to 6.8.  Likely postoperative in nature with removal of left lower extremity.  Will give 1 unit of packed red blood cells and continue to monitor.

## 2024-02-13 NOTE — Assessment & Plan Note (Signed)
 Will need another weight after amputation.  Last BMI 33.20 which is class I obesity.

## 2024-02-13 NOTE — Plan of Care (Signed)
  Problem: Education: Goal: Knowledge of General Education information will improve Description: Including pain rating scale, medication(s)/side effects and non-pharmacologic comfort measures Outcome: Progressing   Problem: Coping: Goal: Level of anxiety will decrease Outcome: Progressing   Problem: Skin Integrity: Goal: Risk for impaired skin integrity will decrease Outcome: Progressing   

## 2024-02-13 NOTE — Progress Notes (Signed)
 Progress Note   Patient: Derrick Blanchard WUJ:[id number] DOB: [birth year] DOA: 02/11/2024     2 DOS: the patient was seen and examined on 02/13/2024   Brief hospital course: 43 y.o. male with medical history significant for Being admitted with pain and bleeding from a left TMA for which he recently underwent revision at [facility] on 5/23, discharged on 02/08/2024.  Triage note states that he was brought in by EMS from the side of the road with a complaint that the wound was bleeding.  Patient has a history of alcohol use disorder and housing insecurity, depression, metALD with cirrhosis, portal hypertension and esophageal varices, chronic hepatitis C,  prior right TMA.  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  BKA was recommended however he declined opting to go to [facility] for an opinion.  He was admitted to [facility] the following day on 5/21 where he remained until 6/5.  Wound cultures grew Proteus and Corynebacterium and he completed linezolid  and Augmentin  today 02/11/2024. Patient states he is in a lot of pain and he is now willing to undergo the previously recommended BKA. In the ED, vitals were within normal limits.  WBC 5.9 with lactic acid 2.2.  Hemoglobin at baseline at 8.3 with baseline thrombocytopenia of 60.  LFTs also at baseline.  X-rays of the left foot suspicious for osteomyelitis. Patient started on Zosyn.  Admission requested   6/9.  Patient seen by vascular surgery and will go to operating room for amputation. 6/10.  Hemoglobin 6.8.  Transfuse 1 unit of packed red blood cells.  Benefits and risk of transfusion explained.  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  Recent OR culture with Proteus and corynebacterium.  Completed linezolid  and Augmentin .  Patient given Zosyn and daptomycin in the emergency room.  Postoperative day 1 for left BKA by Dr. Vonna Guardian.  Continue Zosyn today.  May  be able to get rid of antibiotics tomorrow.  Acute blood loss anemia Hemoglobin went from 9.0 down to 6.8.  Likely postoperative in nature with removal of left lower extremity.  Will give 1 unit of packed red blood cells and continue to monitor.  Lactic acidosis Continue to monitor.  Could be secondary to infection.  Received a fluid bolus.  Left leg pain Pain control with oral oxycodone  and as needed IV Dilaudid .  ALC (alcoholic liver cirrhosis) (HCC) History of portal hypertension and esophageal varices.  Hypokalemia Replaced  Alcohol use disorder Continue folate thiamine  and multivitamin.  Chronic hepatitis C (HCC) Follow-up as outpatient.  Obesity (BMI 30-39.9) Will need another weight after amputation.  Last BMI 33.20 which is class I obesity.  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  Continue to monitor closely may end up needing another unit of platelets.  Received platelets prior to amputation.        Subjective: Patient having a lot of pain with his leg.  Asking for more pain medication.  Patient already on 10 to 20 mg of oxycodone  every 4 hours.  Added as needed IV Dilaudid  loading dose.  Came in with osteomyelitis and wound opening up.  BKA done on 6/9.  Physical Exam: Vitals:   02/13/24 0112 02/13/24 0322 02/13/24 0735 02/13/24 1155  BP: 121/71 118/66 116/68 124/65  Pulse: 92 81 83 94  Resp: 18 18 16 17   Temp: 98.8 F (37.1 C) 98.4 F (36.9 C) (!) 97.5 F (  36.4 C) 98 F (36.7 C)  TempSrc:  Oral Oral Oral  SpO2: 100% 99% 98% 97%  Weight:      Height:       Physical Exam HENT:     Head: Normocephalic.     Mouth/Throat:     Pharynx: No oropharyngeal exudate.  Eyes:     General: Lids are normal.     Conjunctiva/sclera: Conjunctivae normal.  Cardiovascular:     Rate and Rhythm: Normal rate and regular rhythm.     Heart sounds: Normal heart sounds, S1 normal and S2 normal.  Pulmonary:     Breath sounds: No  decreased breath sounds, wheezing, rhonchi or rales.  Abdominal:     Palpations: Abdomen is soft.     Tenderness: There is no abdominal tenderness.  Musculoskeletal:     Right lower leg: Swelling present.     Right Lower Extremity: Right leg is amputated below ankle.     Left Lower Extremity: Left leg is amputated below knee.  Skin:    General: Skin is warm.     Comments: Chronic lower extremity skin discoloration.  Neurological:     Mental Status: He is alert and oriented to person, place, and time.     Data Reviewed: Hemoglobin 6.8, platelet count 46, white blood count 4.5, creatinine 0.79   Disposition: Status is: Inpatient Remains inpatient appropriate because: Postoperative day 1 left BKA  Planned Discharge Destination: Rehab    Time spent: 28 minutes  Author: Verla Glaze, MD 02/13/2024 1:48 PM  For on call review [email or web address].

## 2024-02-14 DIAGNOSIS — D696 Thrombocytopenia, unspecified: Secondary | ICD-10-CM | POA: Diagnosis not present

## 2024-02-14 DIAGNOSIS — F1020 Alcohol dependence, uncomplicated: Secondary | ICD-10-CM | POA: Diagnosis not present

## 2024-02-14 DIAGNOSIS — B182 Chronic viral hepatitis C: Secondary | ICD-10-CM | POA: Diagnosis not present

## 2024-02-14 DIAGNOSIS — D62 Acute posthemorrhagic anemia: Secondary | ICD-10-CM | POA: Diagnosis not present

## 2024-02-14 LAB — TYPE AND SCREEN
ABO/RH(D): O POS
Antibody Screen: NEGATIVE
Unit division: 0
Unit division: 0
Unit division: 0

## 2024-02-14 LAB — BPAM RBC
Blood Product Expiration Date: 202506302359
Blood Product Expiration Date: 202507062359
Blood Product Expiration Date: 202507092359
ISSUE DATE / TIME: 202506091431
ISSUE DATE / TIME: 202506091431
ISSUE DATE / TIME: 202506101733
Unit Type and Rh: 5100
Unit Type and Rh: 5100
Unit Type and Rh: 5100

## 2024-02-14 LAB — CBC
HCT: 22.1 % — ABNORMAL LOW (ref 39.0–52.0)
Hemoglobin: 7 g/dL — ABNORMAL LOW (ref 13.0–17.0)
MCH: 25.8 pg — ABNORMAL LOW (ref 26.0–34.0)
MCHC: 31.7 g/dL (ref 30.0–36.0)
MCV: 81.5 fL (ref 80.0–100.0)
Platelets: 41 10*3/uL — ABNORMAL LOW (ref 150–400)
RBC: 2.71 MIL/uL — ABNORMAL LOW (ref 4.22–5.81)
RDW: 17.7 % — ABNORMAL HIGH (ref 11.5–15.5)
WBC: 4.3 10*3/uL (ref 4.0–10.5)
nRBC: 0 % (ref 0.0–0.2)

## 2024-02-14 LAB — HEMOGLOBIN AND HEMATOCRIT, BLOOD
HCT: 23.4 % — ABNORMAL LOW (ref 39.0–52.0)
Hemoglobin: 7.6 g/dL — ABNORMAL LOW (ref 13.0–17.0)

## 2024-02-14 LAB — IRON AND TIBC
Iron: 19 ug/dL — ABNORMAL LOW (ref 45–182)
Saturation Ratios: 5 % — ABNORMAL LOW (ref 17.9–39.5)
TIBC: 357 ug/dL (ref 250–450)
UIBC: 338 ug/dL

## 2024-02-14 LAB — SURGICAL PATHOLOGY

## 2024-02-14 NOTE — Progress Notes (Signed)
 [facility]  Daily Progress Note   Subjective  -   Complains of appropriate postoperative pain.  Did work with physical therapy today.  No major events overnight.  Objective Vitals:   02/14/24 0425 02/14/24 0500 02/14/24 0734 02/14/24 1258  BP: 108/65  124/65 127/73  Pulse: 70  74 83  Resp: 17  18 18   Temp: 99 F (37.2 C)  98.7 F (37.1 C) 98.1 F (36.7 C)  TempSrc:      SpO2: 100%  99% 100%  Weight:  99 kg    Height:        Intake/Output Summary (Last 24 hours) at 02/14/2024 1435 Last data filed at 02/14/2024 0736 Gross per 24 hour  Intake 642.38 ml  Output 2300 ml  Net -1657.62 ml    PULM  CTAB CV  RRR VASC  Stump dressing is C/D/I  Laboratory CBC    Component Value Date/Time   WBC 4.3 02/14/2024 0324   HGB 7.0 (L) 02/14/2024 0324   HGB 12.1 (L) 09/20/2013 1624   HCT 22.1 (L) 02/14/2024 0324   HCT 36.8 (L) 09/20/2013 1624   PLT 41 (L) 02/14/2024 0324   PLT 97 (L) 09/20/2013 1624    BMET    Component Value Date/Time   NA 137 02/13/2024 0415   NA 144 11/24/2017 1025   NA 138 02/12/2013 0508   K 4.4 02/13/2024 0415   K 4.7 02/12/2013 0508   CL 109 02/13/2024 0415   CL 105 02/12/2013 0508   CO2 20 (L) 02/13/2024 0415   CO2 27 02/12/2013 0508   GLUCOSE 129 (H) 02/13/2024 0415   GLUCOSE 107 (H) 02/12/2013 0508   BUN 14 02/13/2024 0415   BUN 5 (L) 11/24/2017 1025   BUN 16 02/12/2013 0508   CREATININE 0.79 02/13/2024 0415   CREATININE 0.51 (L) 02/12/2013 0508   CALCIUM  8.2 (L) 02/13/2024 0415   CALCIUM  8.3 (L) 02/12/2013 0508   GFRNONAA >60 02/13/2024 0415   GFRNONAA >60 02/12/2013 0508   GFRAA >60 11/07/2018 0153   GFRAA >60 02/12/2013 0508    Assessment/Planning: POD #2 s/p left BKA  Overall doing reasonably well Continue to work with PT/OT Hemoglobin stable Can remove the surgical dressing tomorrow or Friday   Mikki Alexander  02/14/2024, 2:35 PM

## 2024-02-14 NOTE — TOC Initial Note (Signed)
 Transition of Care [facility]) - Initial/Assessment Note    Patient Details  Name: Derrick Blanchard MRN: [id number] Date of Birth: [birth year]  Transition of Care [facility]) CM/SW Contact:    Alexandra Ice, RN Phone Number: 02/14/2024, 1:03 PM  Clinical Narrative:                 Patient S/P left BKA. PT/OT eval pending. Patient is pending possible blood transfusion. TOC will continue to monitor for potential discharge needs.  Expected Discharge Plan: Skilled Nursing Facility Barriers to Discharge: Continued Medical Work up   Patient Goals and CMS Choice            Expected Discharge Plan and Services     Post Acute Care Choice: Skilled Nursing Facility, Durable Medical Equipment Living arrangements for the past 2 months: Single Family Home                                      Prior Living Arrangements/Services Living arrangements for the past 2 months: Single Family Home Lives with:: Friends Patient language and need for interpreter reviewed:: Yes Do you feel safe going back to the place where you live?: Yes      Need for Family Participation in Patient Care: No (Comment) Care giver support system in place?: No (comment) Current home services: DME Criminal Activity/Legal Involvement Pertinent to Current Situation/Hospitalization: No - Comment as needed  Activities of Daily Living   ADL Screening (condition at time of admission) Independently performs ADLs?: Yes (appropriate for developmental age) Does the patient have a NEW difficulty with bathing/dressing/toileting/self-feeding that is expected to last >3 days?: No Does the patient have a NEW difficulty with getting in/out of bed, walking, or climbing stairs that is expected to last >3 days?: No Does the patient have a NEW difficulty with communication that is expected to last >3 days?: No Is the patient deaf or have difficulty hearing?: No Does the patient have difficulty seeing, even when wearing  glasses/contacts?: No Does the patient have difficulty concentrating, remembering, or making decisions?: No  Permission Sought/Granted                  Emotional Assessment Appearance:: Appears stated age     Orientation: : Oriented to Self, Oriented to Place, Oriented to  Time, Oriented to Situation Alcohol / Substance Use: Not Applicable Psych Involvement: No (comment)  Admission diagnosis:  Wound infection [T14.8XXA, L08.9] Osteomyelitis of left foot, unspecified type [facility]) [M86.9] Patient Active Problem List   Diagnosis Date Noted   Acute blood loss anemia 02/13/2024   Obesity (BMI 30-39.9) 02/13/2024   Lactic acidosis 02/12/2024   Hypernatremia 02/12/2024   Wound infection 02/11/2024   Generalized weakness 01/16/2024   Wound dehiscence 01/15/2024   Left leg pain 01/15/2024   Complication of foot amputation stump (HCC) 01/06/2024   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) 01/03/2024   Infection of toe as complication of amputation (HCC) 12/27/2023   Wound of left foot 12/13/2023   Open wound of left lower extremity 12/13/2023   Alcohol use disorder 12/13/2023   Osteomyelitis (HCC) 12/13/2023   Hypomagnesemia 03/13/2023   Diarrhea 03/12/2023   Severe sepsis (HCC) 03/11/2023   ALC (alcoholic liver cirrhosis) (HCC) 03/11/2023   Chronic hepatitis C (HCC) 03/11/2023   Cellulitis of left lower extremity 03/11/2023   Ulcer of left foot with muscle involvement without evidence of necrosis (  HCC) 03/11/2023   Nausea and vomiting 03/11/2023   Portal hypertension with esophageal varices (HCC) 11/27/2019   Thrombocytopenia, acquired (HCC) 11/27/2019   GERD (gastroesophageal reflux disease) 11/24/2017   Tachycardia 11/24/2017   Hypokalemia 10/25/2017   Thrombocytopenia (HCC) 10/25/2017   Advance care planning 10/25/2017   Depression, major, single episode, moderate (HCC) 10/25/2017   Elevated liver enzymes 09/28/2017   Hyperbilirubinemia    Alcohol  use disorder, severe, dependence (HCC) 02/03/2015   Alcoholic hepatitis 01/30/2015   Hematochezia 01/30/2015   Coagulopathy (HCC) 01/30/2015   Hyponatremia 01/30/2015   PCP:  Patient, No Pcp Per Pharmacy:   [facility] [address] [place] [address] Phone: [phone] Fax: [phone]  [facility] [address]), [place] - [address] [place] ([place]) [place] [address] Phone: [phone] Fax: [phone]  [facility] [place], [place] - [address] [place] [address] Phone: [phone] Fax: [phone]     Social Drivers of Health (SDOH) Social History: SDOH Screenings   Food Insecurity: Food Insecurity Present (02/12/2024)  Housing: High Risk (02/12/2024)  Transportation Needs: Unmet Transportation Needs (02/12/2024)  Utilities: Not At Risk (02/12/2024)  Recent Concern: Utilities - At Risk (12/27/2023)  Financial Resource Strain: Medium Risk (01/25/2024)   Received from [facility]  Social Connections: Unknown (01/15/2024)  Tobacco Use: High Risk (02/12/2024)   SDOH Interventions:     Readmission Risk Interventions     No data to display

## 2024-02-14 NOTE — Progress Notes (Signed)
 Inpatient Rehab Admissions Coordinator:   Per therapy recommendations pt was screened for CIR by Loye Rumble, PT, DPT.  Note CGA for mobility on POD 2, able to mobilize short distances with RW.  Expect he will likely progress quickly and not require CIR admission.  We will not pursue at this time.   Loye Rumble, PT, DPT Admissions Coordinator [phone] 02/14/24  4:12 PM

## 2024-02-14 NOTE — Evaluation (Signed)
 Physical Therapy Evaluation Patient Details Name: Derrick Blanchard MRN: [id number] DOB: 1980/09/19 Today's Date: 02/14/2024  History of Present Illness  Pt is a 43 year old male with history of alcoholic cirrhosis, hypertension, osteomyelitis DM, osteomyelitis s/p recent transmetatarsal amputation and revision who presented to the emergency department for evaluation of ongoing foot pain. MD Diagnoses includes osteomyelitis of left foot (unspecified type, HCC), acute blood loss anemia, lactic acidosis, ALC, and hypokalemia. Pt is S/P L BKA.  Clinical Impression  Pt was pleasant and motivated to participate during the session and put forth good effort throughout. Pt POD #2 L BKA. Pt mod indep with bed mobility using bedrails to come from supine to sitting EOB. Pt required CGA completing STS with RW. Pt was educated on proper hop to pattern sequencing with RW and was able to ambulate 3 feet x 5 forwards and backwards. Pt was educated on completing quad sets daily with pillow under distal end of residual limb to prevent knee flexion contracture. Pt completed 10 quad sets with LLE. Pt will benefit from continued PT services upon discharge to safely address deficits listed in patient problem list for decreased caregiver assistance and eventual return to PLOF.        If plan is discharge home, recommend the following: A little help with walking and/or transfers;A little help with bathing/dressing/bathroom;Help with stairs or ramp for entrance;Assist for transportation;Assistance with cooking/housework   Can travel by private vehicle   Yes    Equipment Recommendations Other (comment) (TBD at next venue of care)  Recommendations for Other Services       Functional Status Assessment Patient has had a recent decline in their functional status and demonstrates the ability to make significant improvements in function in a reasonable and predictable amount of time.     Precautions / Restrictions  Precautions Precautions: Fall Recall of Precautions/Restrictions: Intact Restrictions Weight Bearing Restrictions Per Provider Order: Yes LLE Weight Bearing Per Provider Order: Non weight bearing      Mobility  Bed Mobility Overal bed mobility: Modified Independent             General bed mobility comments: Used bedrails to come to sitting EOB from supine    Transfers Overall transfer level: Needs assistance Equipment used: Rolling walker (2 wheels) Transfers: Sit to/from Stand Sit to Stand: Contact guard assist           General transfer comment: VC's to push off of bed to come to standing    Ambulation/Gait Ambulation/Gait assistance: Contact guard assist Gait Distance (Feet): 3 Feet x 5 forwards and backwards Assistive device: Rolling walker (2 wheels) Gait Pattern/deviations: Trunk flexed       General Gait Details: able to use BUE on walker to hop forwards and backwards with CGA and multimodal cuing for proper sequencing  Stairs            Wheelchair Mobility     Tilt Bed    Modified Rankin (Stroke Patients Only)       Balance Overall balance assessment: Needs assistance Sitting-balance support: No upper extremity supported, Feet supported Sitting balance-Leahy Scale: Normal     Standing balance support: Bilateral upper extremity supported, Reliant on assistive device for balance Standing balance-Leahy Scale: Fair                               Pertinent Vitals/Pain Pain Assessment Pain Assessment: 0-10 Pain Score: 9  Pain Descriptors / Indicators: Constant,  Burning, Other (Comment) (phantom pain and residual pain) Pain Intervention(s): Monitored during session    Home Living Family/patient expects to be discharged to:: Private residence Living Arrangements: Alone Available Help at Discharge: Other (Comment) (pt stated that he did not have family close by or friends available) Type of Home: Apartment Home Access:  Stairs to enter Entrance Stairs-Rails:  (handrail in the middle of stairs) Entrance Stairs-Number of Steps: 7   Home Layout: One level Home Equipment: Agricultural consultant (2 wheels) (Pt reported that RW is not in good condition)      Prior Function Prior Level of Function : Independent/Modified Independent             Mobility Comments: Pt stated he was a limited community ambulator using RW when going to the grocery store ADLs Comments: Pt stated that he utilized RW in home to complete ADLs     Extremity/Trunk Assessment   Upper Extremity Assessment Upper Extremity Assessment: Overall WFL for tasks assessed    Lower Extremity Assessment Lower Extremity Assessment: Generalized weakness LLE Deficits / Details: Pain and weakness in LLE due to being S/P L BKA       Communication   Communication Communication: No apparent difficulties    Cognition Arousal: Alert Behavior During Therapy: WFL for tasks assessed/performed   PT - Cognitive impairments: No apparent impairments                         Following commands: Intact       Cueing Cueing Techniques: Verbal cues     General Comments      Exercises Total Joint Exercises Quad Sets: AROM, Left, 10 reps Other Exercises Other Exercises: Pt educated on doing quad sets throughout the day with pillow placed under distal end of residual limb to prevent knee flexion contracture Positioning education provided to promote L knee ext PROM while at rest    Assessment/Plan    PT Assessment Patient needs continued PT services  PT Problem List Decreased strength;Decreased activity tolerance;Decreased balance;Decreased mobility;Pain       PT Treatment Interventions DME instruction;Gait training;Therapeutic exercise;Balance training;Stair training;Functional mobility training;Therapeutic activities    PT Goals (Current goals can be found in the Care Plan section)  Acute Rehab PT Goals Patient Stated Goal: to be  able to walk to the grocery store with no pain PT Goal Formulation: With patient Time For Goal Achievement: 02/27/24 Potential to Achieve Goals: Good    Frequency 7X/week     Co-evaluation               AM-PAC PT 6 Clicks Mobility  Outcome Measure Help needed turning from your back to your side while in a flat bed without using bedrails?: A Little Help needed moving from lying on your back to sitting on the side of a flat bed without using bedrails?: A Little Help needed moving to and from a bed to a chair (including a wheelchair)?: A Little Help needed standing up from a chair using your arms (e.g., wheelchair or bedside chair)?: A Little Help needed to walk in hospital room?: A Lot Help needed climbing 3-5 steps with a railing? : Total 6 Click Score: 15    End of Session Equipment Utilized During Treatment: Gait belt Activity Tolerance: Patient tolerated treatment well Patient left: in bed;with call bell/phone within reach;with bed alarm set Nurse Communication: Mobility status PT Visit Diagnosis: Muscle weakness (generalized) (M62.81);Difficulty in walking, not elsewhere classified (R26.2);Pain;Unsteadiness on feet (R26.81);Other  abnormalities of gait and mobility (R26.89) Pain - Right/Left: Left Pain - part of body: Knee;Leg    Time: 1020-1055 PT Time Calculation (min) (ACUTE ONLY): 35 min   Charges:                 Rhoda Centers, SPT 02/14/24, 2:22 PM

## 2024-02-14 NOTE — Progress Notes (Signed)
 Progress Note    02/14/2024 8:30 AM 2 Days Post-Op  Subjective:  Derrick Blanchard is a 43 yo male now POD #1 from left BKA.  Patient complains of pain this morning at the site.  Dressing is clean dry and intact.  No complaints overnight vitals are remained stable.    Vitals:   02/14/24 0425 02/14/24 0734  BP: 108/65 124/65  Pulse: 70 74  Resp: 17 18  Temp: 99 F (37.2 C) 98.7 F (37.1 C)  SpO2: 100% 99%   Physical Exam: Cardiac:  RRR, normal S1 and S2.  No rubs clicks gallops or murmurs Lungs: Clear throughout on auscultation.  No rales rhonchi or wheezing. Incisions: Left BKA incision clean dry and intact. Extremities: Right lower extremity warm with palpable pulses.  Left lower extremity now with BKA. Abdomen: Positive bowel sounds throughout, soft, nontender nondistended. Neurologic: Alert and oriented x 4, answers all questions and follows commands appropriately  CBC    Component Value Date/Time   WBC 4.3 02/14/2024 0324   RBC 2.71 (L) 02/14/2024 0324   HGB 7.0 (L) 02/14/2024 0324   HGB 12.1 (L) 09/20/2013 1624   HCT 22.1 (L) 02/14/2024 0324   HCT 36.8 (L) 09/20/2013 1624   PLT 41 (L) 02/14/2024 0324   PLT 97 (L) 09/20/2013 1624   MCV 81.5 02/14/2024 0324   MCV 92 09/20/2013 1624   MCH 25.8 (L) 02/14/2024 0324   MCHC 31.7 02/14/2024 0324   RDW 17.7 (H) 02/14/2024 0324   RDW 14.0 09/20/2013 1624   LYMPHSABS 0.8 02/11/2024 1822   LYMPHSABS 1.2 09/20/2013 1624   MONOABS 0.6 02/11/2024 1822   MONOABS 0.5 09/20/2013 1624   EOSABS 0.0 02/11/2024 1822   EOSABS 0.1 09/20/2013 1624   BASOSABS 0.0 02/11/2024 1822   BASOSABS 0.0 09/20/2013 1624    BMET    Component Value Date/Time   NA 137 02/13/2024 0415   NA 144 11/24/2017 1025   NA 138 02/12/2013 0508   K 4.4 02/13/2024 0415   K 4.7 02/12/2013 0508   CL 109 02/13/2024 0415   CL 105 02/12/2013 0508   CO2 20 (L) 02/13/2024 0415   CO2 27 02/12/2013 0508   GLUCOSE 129 (H) 02/13/2024 0415   GLUCOSE 107  (H) 02/12/2013 0508   BUN 14 02/13/2024 0415   BUN 5 (L) 11/24/2017 1025   BUN 16 02/12/2013 0508   CREATININE 0.79 02/13/2024 0415   CREATININE 0.51 (L) 02/12/2013 0508   CALCIUM  8.2 (L) 02/13/2024 0415   CALCIUM  8.3 (L) 02/12/2013 0508   GFRNONAA >60 02/13/2024 0415   GFRNONAA >60 02/12/2013 0508   GFRAA >60 11/07/2018 0153   GFRAA >60 02/12/2013 0508    INR    Component Value Date/Time   INR 1.4 (H) 12/13/2023 1329   INR 1.4 02/07/2013 0137     Intake/Output Summary (Last 24 hours) at 02/14/2024 0830 Last data filed at 02/14/2024 0736 Gross per 24 hour  Intake 642.38 ml  Output 2300 ml  Net -1657.62 ml     Assessment/Plan:  43 y.o. male is s/p left BKA 2 Days Post-Op   PLAN Pain medication as needed Darolyn Elks diet as tolerated PT OT consults. First dressing change to be done by vascular surgery on Thursday, 02/15/2024.  Patient to be discharged to rehab after first dressing change. Patient's hemoglobin was noted to be 6.9 this morning.  Repeat CBC is being done.  If patient's hemoglobin is less than 7 we will transfuse with 1 unit packed  red blood cells.   DVT prophylaxis: Lovenox  on hold this morning due to low hemoglobin    Annamaria Barrette [facility] 02/14/2024 8:30 AM

## 2024-02-14 NOTE — Progress Notes (Signed)
 Progress Note   Patient: Derrick Blanchard YQM:[id number] DOB: 02-10-1981 DOA: 02/11/2024     3 DOS: the patient was seen and examined on 02/14/2024   Brief hospital course: 43 y.o. male with medical history significant for Being admitted with pain and bleeding from a left TMA for which he recently underwent revision at [facility] on 5/23, discharged on 02/08/2024.  Triage note states that he was brought in by EMS from the side of the road with a complaint that the wound was bleeding.  Patient has a history of alcohol use disorder and housing insecurity, depression, metALD with cirrhosis, portal hypertension and esophageal varices, chronic hepatitis C,  prior right TMA.  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  BKA was recommended however he declined opting to go to [facility] for an opinion.  He was admitted to [facility] the following day on 5/21 where he remained until 6/5.  Wound cultures grew Proteus and Corynebacterium and he completed linezolid  and Augmentin  today 02/11/2024. Patient states he is in a lot of pain and he is now willing to undergo the previously recommended BKA. In the ED, vitals were within normal limits.  WBC 5.9 with lactic acid 2.2.  Hemoglobin at baseline at 8.3 with baseline thrombocytopenia of 60.  LFTs also at baseline.  X-rays of the left foot suspicious for osteomyelitis. Patient started on Zosyn.  Admission requested   6/9.  Patient seen by vascular surgery and will go to operating room for amputation. 6/10.  Hemoglobin 6.8.  Transfuse 1 unit of packed red blood cells.  Benefits and risk of transfusion explained.  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   Recent OR culture with Proteus and corynebacterium.  Completed linezolid  and Augmentin .   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  Likely postoperative in nature with removal of left lower extremity.  S/p 1 unit RBC transfusion --Monitor Hbg, transfuse for < 7  Lactic acidosis Continue to monitor.  Could be secondary to infection.  Received a fluid bolus.  Left leg pain Pain control with oral oxycodone  and as needed IV Dilaudid .  ALC (alcoholic liver cirrhosis) (HCC) History of portal hypertension and esophageal varices.  Hypokalemia Replaced  Alcohol use disorder Continue folate thiamine  and multivitamin.  Chronic hepatitis C (HCC) Follow-up as outpatient.  Obesity (BMI 30-39.9) Will need another weight after amputation.  Last BMI 33.20 which is class I obesity.  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  Continue to monitor closely may end up needing another unit of platelets.  Received platelets prior to amputation.      Subjective: Patient sleeping soundly, wakes briefly to voice this AM on rounds.  Pain little better controlled. No acute events or complaints.  Physical Exam: Vitals:   02/14/24 0425 02/14/24 0500 02/14/24 0734 02/14/24 1258  BP: 108/65  124/65 127/73  Pulse: 70  74 83  Resp: 17  18 18   Temp: 99 F (37.2 C)  98.7 F (37.1 C) 98.1 F (36.7 C)  TempSrc:      SpO2: 100%  99% 100%  Weight:  99 kg    Height:       Physical Exam HENT:     Head: Normocephalic.  Mouth/Throat:     Pharynx: No oropharyngeal exudate.  Eyes:     General: Lids are normal.     Conjunctiva/sclera: Conjunctivae normal.  Cardiovascular:     Rate and Rhythm: Normal rate and regular rhythm.     Heart sounds: Normal heart sounds, S1 normal and S2 normal.  Pulmonary:     Breath sounds: No decreased breath sounds, wheezing, rhonchi or rales.  Abdominal:     Palpations: Abdomen is soft.     Tenderness: There is no abdominal tenderness.  Musculoskeletal:     Right lower leg: Swelling  present.     Right Lower Extremity: Right leg is amputated below ankle.     Left Lower Extremity: Left leg is amputated below knee.  Skin:    General: Skin is warm.     Comments: Chronic lower extremity skin discoloration.  Neurological:     Mental Status: He is alert and oriented to person, place, and time.     Data Reviewed: Hemoglobin 6.8, platelet count 46, white blood count 4.5, creatinine 0.79   Disposition: Status is: Inpatient Remains inpatient appropriate because: Postoperative day 1 left BKA  Planned Discharge Destination: Rehab    Time spent: 28 minutes  Author: Montey Apa, DO 02/14/2024 5:06 PM  For on call review [email or web address].

## 2024-02-15 DIAGNOSIS — M79605 Pain in left leg: Secondary | ICD-10-CM | POA: Diagnosis not present

## 2024-02-15 DIAGNOSIS — D62 Acute posthemorrhagic anemia: Secondary | ICD-10-CM | POA: Diagnosis not present

## 2024-02-15 DIAGNOSIS — D696 Thrombocytopenia, unspecified: Secondary | ICD-10-CM | POA: Diagnosis not present

## 2024-02-15 LAB — CBC
HCT: 22.6 % — ABNORMAL LOW (ref 39.0–52.0)
Hemoglobin: 7 g/dL — ABNORMAL LOW (ref 13.0–17.0)
MCH: 25.5 pg — ABNORMAL LOW (ref 26.0–34.0)
MCHC: 31 g/dL (ref 30.0–36.0)
MCV: 82.5 fL (ref 80.0–100.0)
Platelets: 44 10*3/uL — ABNORMAL LOW (ref 150–400)
RBC: 2.74 MIL/uL — ABNORMAL LOW (ref 4.22–5.81)
RDW: 18 % — ABNORMAL HIGH (ref 11.5–15.5)
WBC: 4.4 10*3/uL (ref 4.0–10.5)
nRBC: 0 % (ref 0.0–0.2)

## 2024-02-15 LAB — CULTURE, BLOOD (ROUTINE X 2)
Culture: NO GROWTH
Culture: NO GROWTH
Special Requests: ADEQUATE
Special Requests: ADEQUATE

## 2024-02-15 MED ORDER — IRON SUCROSE 200 MG IVPB - SIMPLE MED
200.0000 mg | Freq: Once | Status: AC
  Administered 2024-02-15: 200 mg via INTRAVENOUS
  Filled 2024-02-15: qty 200

## 2024-02-15 MED ORDER — PREGABALIN 50 MG PO CAPS
100.0000 mg | ORAL_CAPSULE | Freq: Once | ORAL | Status: AC
  Administered 2024-02-15: 100 mg via ORAL
  Filled 2024-02-15: qty 2

## 2024-02-15 MED ORDER — HYDROXYZINE HCL 25 MG PO TABS
25.0000 mg | ORAL_TABLET | Freq: Three times a day (TID) | ORAL | Status: DC | PRN
  Administered 2024-02-16 – 2024-02-19 (×5): 25 mg via ORAL
  Filled 2024-02-15 (×7): qty 1

## 2024-02-15 MED ORDER — PREGABALIN 75 MG PO CAPS
200.0000 mg | ORAL_CAPSULE | Freq: Two times a day (BID) | ORAL | Status: DC
  Administered 2024-02-15 – 2024-02-23 (×16): 200 mg via ORAL
  Filled 2024-02-15 (×16): qty 1

## 2024-02-15 NOTE — TOC Progression Note (Signed)
 Transition of Care [facility]) - Progression Note    Patient Details  Name: BINH DOTEN MRN: [id number] Date of Birth: 1980/12/21  [facility] CM/SW Contact  Alexandra Ice, RN Phone Number: 02/15/2024, 8:40 AM  Clinical Narrative:    Per notes, CIR declined patient. TOC will continue monitor for discharge needs.   Expected Discharge Plan: Skilled Nursing Facility Barriers to Discharge: Continued Medical Work up  Expected Discharge Plan and Services     Post Acute Care Choice: Skilled Nursing Facility, Durable Medical Equipment Living arrangements for the past 2 months: Single Family Home                                       Social Determinants of Health (SDOH) Interventions SDOH Screenings   Food Insecurity: Food Insecurity Present (02/12/2024)  Housing: High Risk (02/12/2024)  Transportation Needs: Unmet Transportation Needs (02/12/2024)  Utilities: Not At Risk (02/12/2024)  Recent Concern: Utilities - At Risk (12/27/2023)  Financial Resource Strain: Medium Risk (01/25/2024)   Received from [facility]  Social Connections: Unknown (01/15/2024)  Tobacco Use: High Risk (02/12/2024)    Readmission Risk Interventions     No data to display

## 2024-02-15 NOTE — Progress Notes (Signed)
 PT Cancellation Note  Patient Details Name: COOLIDGE GOSSARD MRN: [id number] DOB: 1980/10/08   Cancelled Treatment:    Reason Eval/Treat Not Completed: Other (comment): Chart reviewed. Patient received in bed. Endorses recent dressing change, and significant pain in residual limb after this. Patient received pain medication prior to dressing change per patient reports. Requesting PT to come back due to pain levels. Will re-attempt at later date/time as medically appropriate.    Hussam Muniz M Fairly, PT, DPT 02/15/24 1:37 PM

## 2024-02-15 NOTE — Progress Notes (Addendum)
 Progress Note   Patient: Derrick Blanchard ZOX:[id number] DOB: December 07, 1980 DOA: 02/11/2024     4 DOS: the patient was seen and examined on 02/15/2024   Brief hospital course: 43 y.o. male with medical history significant for Being admitted with pain and bleeding from a left TMA for which he recently underwent revision at [facility] on 5/23, discharged on 02/08/2024.  Triage note states that he was brought in by EMS from the side of the road with a complaint that the wound was bleeding.  Patient has a history of alcohol use disorder and housing insecurity, depression, metALD with cirrhosis, portal hypertension and esophageal varices, chronic hepatitis C,  prior right TMA.  He had left TMA 12/2023 which was complicated by dehiscence for which she was hospitalized at [facility] from 5/12 to 01/23/2024.  BKA was recommended however he declined opting to go to [facility] for an opinion.  He was admitted to [facility] the following day on 5/21 where he remained until 6/5.  Wound cultures grew Proteus and Corynebacterium and he completed linezolid  and Augmentin  today 02/11/2024. Patient states he is in a lot of pain and he is now willing to undergo the previously recommended BKA. In the ED, vitals were within normal limits.  WBC 5.9 with lactic acid 2.2.  Hemoglobin at baseline at 8.3 with baseline thrombocytopenia of 60.  LFTs also at baseline.  X-rays of the left foot suspicious for osteomyelitis. Patient started on Zosyn.  Admission requested   6/9.  Patient seen by vascular surgery and will go to operating room for amputation. 6/10.  Hemoglobin 6.8.  Transfuse 1 unit of packed red blood cells.  Benefits and risk of transfusion explained.  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   Recent OR culture with Proteus and corynebacterium.  Completed linezolid  and Augmentin .   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   Likely postoperative in nature with removal of left lower extremity.  S/p 1 unit RBC transfusion.   Hbg 7.0 this AM --Monitor Hbg, transfuse for < 7 --IV iron infusion today  Lactic acidosis Continue to monitor.  Could be secondary to infection.  Received a fluid bolus.  Left leg pain Phantom Limb Pain Pain control with oral oxycodone  and as needed IV Dilaudid . Increase Lyrica to 200 mg BID  ALC (alcoholic liver cirrhosis) (HCC) History of portal hypertension and esophageal varices.  Hypokalemia Replaced  Alcohol use disorder Continue folate thiamine  and multivitamin.  Anxiety - unclear if chronic vs situational here in hospital & having amputation Vistaril PRN.  Increased Lyrica likely to help also. Avoiding benzo's since needing high doses opioids for pain  Chronic hepatitis C (HCC) Follow-up as outpatient.  Obesity (BMI 30-39.9) Will need another weight after amputation.  Last BMI 33.20 which is class I obesity.  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  Continue to monitor closely may end up needing another unit of platelets.  Received platelets prior to amputation.      Subjective: Patient up in recliner, just worked with PT.  He reports severe left limb pain and buring sensation that extends past his residual limb.  Besides pain and anxiety, pt has no other medical complaints at this time.    Physical Exam: Vitals:   02/14/24 2059 02/15/24  0454 02/15/24 0459 02/15/24 0740  BP: 115/80 115/80  120/67  Pulse: 89 (!) 106  100  Resp: 16 18  18   Temp: 98.3 F (36.8 C) 98 F (36.7 C)  98.3 F (36.8 C)  TempSrc:      SpO2: 100% 98%  100%  Weight:   99 kg   Height:       General exam: awake, alert, no acute distress HEENT: moist mucus membranes, hearing grossly normal  Respiratory system: CTAB, no wheezes, rales or  rhonchi, normal respiratory effort. Cardiovascular system: normal S1/S2, RRR   Gastrointestinal system: soft, NT, ND, no HSM felt, +bowel sounds. Central nervous system: A&O x3. no gross focal neurologic deficits, normal speech Extremities: L BKA dressing in placee, trace RLE edema Skin: dry, intact, normal temperature Psychiatry: normal mood, congruent affect, judgement and insight appear normal    Data Reviewed: Hemoglobin 7.0 pLatelets 44 Iron 19    Disposition: Status is: Inpatient Remains inpatient appropriate because: Postoperative day 1 left BKA  Planned Discharge Destination: Rehab    Time spent: 28 minutes  Author: Montey Apa, DO 02/15/2024 3:02 PM  For on call review [email or web address].

## 2024-02-15 NOTE — Progress Notes (Addendum)
 Progress Note    02/15/2024 1:08 PM 3 Days Post-Op  Subjective:  Derrick Blanchard is a 43 yo male now POD #3 from left BKA. Patient complains of pain this morning at the site. Dressing is clean dry and intact. No complaints overnight vitals are remained stable.    Vitals:   02/15/24 0454 02/15/24 0740  BP: 115/80 120/67  Pulse: (!) 106 100  Resp: 18 18  Temp: 98 F (36.7 C) 98.3 F (36.8 C)  SpO2: 98% 100%   Physical Exam: Cardiac:  RRR, normal S1 and S2.  No rubs clicks gallops or murmurs Lungs: Clear throughout on auscultation.  No rales rhonchi or wheezing. Incisions: Left BKA incision clean dry and intact. Extremities: Right lower extremity warm with palpable pulses.  Left lower extremity now with BKA. Abdomen: Positive bowel sounds throughout, soft, nontender nondistended. Neurologic: Alert and oriented x 4, answers all questions and follows commands appropriately  CBC    Component Value Date/Time   WBC 4.4 02/15/2024 0358   RBC 2.74 (L) 02/15/2024 0358   HGB 7.0 (L) 02/15/2024 0358   HGB 12.1 (L) 09/20/2013 1624   HCT 22.6 (L) 02/15/2024 0358   HCT 36.8 (L) 09/20/2013 1624   PLT 44 (L) 02/15/2024 0358   PLT 97 (L) 09/20/2013 1624   MCV 82.5 02/15/2024 0358   MCV 92 09/20/2013 1624   MCH 25.5 (L) 02/15/2024 0358   MCHC 31.0 02/15/2024 0358   RDW 18.0 (H) 02/15/2024 0358   RDW 14.0 09/20/2013 1624   LYMPHSABS 0.8 02/11/2024 1822   LYMPHSABS 1.2 09/20/2013 1624   MONOABS 0.6 02/11/2024 1822   MONOABS 0.5 09/20/2013 1624   EOSABS 0.0 02/11/2024 1822   EOSABS 0.1 09/20/2013 1624   BASOSABS 0.0 02/11/2024 1822   BASOSABS 0.0 09/20/2013 1624    BMET    Component Value Date/Time   NA 137 02/13/2024 0415   NA 144 11/24/2017 1025   NA 138 02/12/2013 0508   K 4.4 02/13/2024 0415   K 4.7 02/12/2013 0508   CL 109 02/13/2024 0415   CL 105 02/12/2013 0508   CO2 20 (L) 02/13/2024 0415   CO2 27 02/12/2013 0508   GLUCOSE 129 (H) 02/13/2024 0415   GLUCOSE 107  (H) 02/12/2013 0508   BUN 14 02/13/2024 0415   BUN 5 (L) 11/24/2017 1025   BUN 16 02/12/2013 0508   CREATININE 0.79 02/13/2024 0415   CREATININE 0.51 (L) 02/12/2013 0508   CALCIUM  8.2 (L) 02/13/2024 0415   CALCIUM  8.3 (L) 02/12/2013 0508   GFRNONAA >60 02/13/2024 0415   GFRNONAA >60 02/12/2013 0508   GFRAA >60 11/07/2018 0153   GFRAA >60 02/12/2013 0508    INR    Component Value Date/Time   INR 1.4 (H) 12/13/2023 1329   INR 1.4 02/07/2013 0137     Intake/Output Summary (Last 24 hours) at 02/15/2024 1308 Last data filed at 02/15/2024 0740 Gross per 24 hour  Intake --  Output 1425 ml  Net -1425 ml     Assessment/Plan:  43 y.o. male is s/p Left BKA 3 Days Post-Op   PLAN Pain medication as needed Advance diet as tolerated Continue to work with PT/OT First dressing change has been completed by vascular surgery on today Thursday, 02/15/2024.  Patient to be discharged to rehab today.  Patient receiving Iron  Infusions for Chronic Anemia   DVT prophylaxis: Lovenox  on hold this morning due to low hemoglobin   Annamaria Barrette [facility] 02/15/2024 1:08 PM

## 2024-02-15 NOTE — Evaluation (Signed)
 Occupational Therapy Evaluation Patient Details Name: Derrick Blanchard MRN: [id number] DOB: 12-13-1980 Today's Date: 02/15/2024   History of Present Illness   Pt is a 43 year old male with history of alcoholic cirrhosis, hypertension, osteomyelitis DM, osteomyelitis s/p recent transmetatarsal amputation and revision who presented to the emergency department for evaluation of ongoing foot pain. MD Diagnoses includes osteomyelitis of left foot (unspecified type, HCC), acute blood loss anemia, lactic acidosis, ALC, and hypokalemia. Pt is S/P L BKA.     Clinical Impressions Chart reviewed to date, pt greeted in room with nurse present to provide pain meds prior to mobility. Pt is alert and oriented x4, agreeable to OT evaluation. Pt is known to this author from previous admission. He reports difficulties caring for previous wound on LLE, now resulting in a BKA. PTA pt reports he was performing ADL with MOD I, increased difficulties due to ongoing medical issues. Pt presents with deficits in strength, endurance, activity tolerance, balance, affecting safe and optimal Adl completion. Bed mobility completed with supervision, STS with CGA, amb in room with RW approx 10' with MIN A. LB dressing completed with supervision. Pt is performing ADL below PLOF, will benefit from acute OT to address functional deficits and to facilitate optimal ADL performance. Pt is left in chair, all needs met. OT will continue to follow.      If plan is discharge home, recommend the following:   A little help with walking and/or transfers;A little help with bathing/dressing/bathroom;Assist for transportation;Help with stairs or ramp for entrance;Assistance with cooking/housework     Functional Status Assessment   Patient has had a recent decline in their functional status and demonstrates the ability to make significant improvements in function in a reasonable and predictable amount of time.     Equipment  Recommendations   Wheelchair (measurements OT);BSC/3in1     Recommendations for Other Services         Precautions/Restrictions   Precautions Precautions: Fall Recall of Precautions/Restrictions: Intact Precaution/Restrictions Comments: L BKA     Mobility Bed Mobility Overal bed mobility: Modified Independent                  Transfers Overall transfer level: Needs assistance Equipment used: Rolling walker (2 wheels) Transfers: Sit to/from Stand Sit to Stand: Contact guard assist, Min assist                  Balance Overall balance assessment: Needs assistance Sitting-balance support: No upper extremity supported, Feet supported Sitting balance-Leahy Scale: Good     Standing balance support: Bilateral upper extremity supported, Reliant on assistive device for balance Standing balance-Leahy Scale: Fair                             ADL either performed or assessed with clinical judgement   ADL Overall ADL's : Needs assistance/impaired Eating/Feeding: Set up;Sitting   Grooming: Sitting;Set up               Lower Body Dressing: Supervision/safety;Sitting/lateral leans Lower Body Dressing Details (indicate cue type and reason): donn R shoe Toilet Transfer: Minimal assistance;Rolling walker (2 wheels);Ambulation Toilet Transfer Details (indicate cue type and reason): simulated to bedside chair via hopping with RW         Functional mobility during ADLs: Minimal assistance;Rolling walker (2 wheels) (approx 10' in room, intermittent vcs for technique)       Vision Patient Visual Report: No change from baseline  Perception         Praxis         Pertinent Vitals/Pain Pain Assessment Pain Assessment: 0-10 Pain Score: 8  Pain Location: residual limb Pain Descriptors / Indicators: Discomfort, Burning Pain Intervention(s): Monitored during session, Repositioned, Premedicated before session     Extremity/Trunk  Assessment Upper Extremity Assessment Upper Extremity Assessment: Overall WFL for tasks assessed   Lower Extremity Assessment Lower Extremity Assessment: Overall WFL for tasks assessed LLE Deficits / Details: s/p L BKA       Communication Communication Communication: No apparent difficulties   Cognition Arousal: Alert Behavior During Therapy: WFL for tasks assessed/performed (reports feeling anxious) Cognition: No apparent impairments                               Following commands: Intact       Cueing  General Comments   Cueing Techniques: Verbal cues  vss throughout; BKA wrapped throughout;   Exercises Other Exercises Other Exercises: edu pt re: role of OT, role of rehab, discharge recommendations, LLE positioning to prevent contracture, residual limb care   Shoulder Instructions      Home Living Family/patient expects to be discharged to:: Private residence Living Arrangements: Alone Available Help at Discharge:  (pt reports no assist available to him) Type of Home: Apartment Home Access: Stairs to enter Entrance Stairs-Number of Steps: 7   Home Layout: One level     Bathroom Shower/Tub: Chief Strategy Officer: Handicapped height     Home Equipment: Agricultural consultant (2 wheels)   Additional Comments: pt reports he is unsure if he can/will go back to apartment; per chart pt has stated he is homeless at times as well      Prior Functioning/Environment               Mobility Comments: limited community amb with RW ADLs Comments: generally MOD I with ADL/IADL but increased difficulites the last approx 1 month due to ongoing medical issues    OT Problem List: Decreased strength;Decreased activity tolerance;Impaired balance (sitting and/or standing);Decreased safety awareness;Pain;Decreased knowledge of precautions;Decreased knowledge of use of DME or AE   OT Treatment/Interventions: Self-care/ADL training;Therapeutic  exercise;Energy conservation;DME and/or AE instruction;Therapeutic activities;Patient/family education;Balance training      OT Goals(Current goals can be found in the care plan section)   Acute Rehab OT Goals Patient Stated Goal: rehab OT Goal Formulation: With patient Time For Goal Achievement: 02/29/24 Potential to Achieve Goals: Good ADL Goals Pt Will Perform Grooming: with modified independence;sitting;standing Pt Will Perform Lower Body Dressing: with modified independence;sitting/lateral leans Pt Will Transfer to Toilet: with modified independence Pt Will Perform Toileting - Clothing Manipulation and hygiene: with modified independence;sit to/from stand;sitting/lateral leans   OT Frequency:  Min 3X/week    Co-evaluation              AM-PAC OT 6 Clicks Daily Activity     Outcome Measure Help from another person eating meals?: None Help from another person taking care of personal grooming?: None Help from another person toileting, which includes using toliet, bedpan, or urinal?: A Little Help from another person bathing (including washing, rinsing, drying)?: None Help from another person to put on and taking off regular upper body clothing?: None Help from another person to put on and taking off regular lower body clothing?: A Little 6 Click Score: 22   End of Session Equipment Utilized During Treatment: Rolling walker (2  wheels);Gait belt Nurse Communication: Mobility status  Activity Tolerance: Patient tolerated treatment well Patient left: in chair;with call bell/phone within reach;with chair alarm set  OT Visit Diagnosis: Other abnormalities of gait and mobility (R26.89);Muscle weakness (generalized) (M62.81);Unsteadiness on feet (R26.81)                Time: 8657-8469 OT Time Calculation (min): 23 min Charges:  OT General Charges $OT Visit: 1 Visit OT Evaluation $OT Eval Moderate Complexity: 1 Mod  Gerre Kraft, OTD OTR/L  02/15/24, 1:23 PM

## 2024-02-16 DIAGNOSIS — D62 Acute posthemorrhagic anemia: Secondary | ICD-10-CM | POA: Diagnosis not present

## 2024-02-16 DIAGNOSIS — M79605 Pain in left leg: Secondary | ICD-10-CM | POA: Diagnosis not present

## 2024-02-16 LAB — CBC
HCT: 21.9 % — ABNORMAL LOW (ref 39.0–52.0)
Hemoglobin: 6.8 g/dL — ABNORMAL LOW (ref 13.0–17.0)
MCH: 25.9 pg — ABNORMAL LOW (ref 26.0–34.0)
MCHC: 31.1 g/dL (ref 30.0–36.0)
MCV: 83.3 fL (ref 80.0–100.0)
Platelets: 53 10*3/uL — ABNORMAL LOW (ref 150–400)
RBC: 2.63 MIL/uL — ABNORMAL LOW (ref 4.22–5.81)
RDW: 18.5 % — ABNORMAL HIGH (ref 11.5–15.5)
WBC: 5.2 10*3/uL (ref 4.0–10.5)
nRBC: 0.6 % — ABNORMAL HIGH (ref 0.0–0.2)

## 2024-02-16 LAB — PREPARE RBC (CROSSMATCH)

## 2024-02-16 MED ORDER — SODIUM CHLORIDE 0.9% IV SOLUTION: Freq: Once | INTRAVENOUS | Status: AC

## 2024-02-16 NOTE — Progress Notes (Signed)
 Progress Note   Patient: Derrick Blanchard ZOX:[id number] DOB: May 01, 1981 DOA: 02/11/2024     5 DOS: the patient was seen and examined on 02/16/2024   Brief hospital course: 43 y.o. male with medical history significant for Being admitted with pain and bleeding from a left TMA for which he recently underwent revision at [facility] on 5/23, discharged on 02/08/2024.  Triage note states that he was brought in by EMS from the side of the road with a complaint that the wound was bleeding.  Patient has a history of alcohol use disorder and housing insecurity, depression, metALD with cirrhosis, portal hypertension and esophageal varices, chronic hepatitis C,  prior right TMA.  He had left TMA 12/2023 which was complicated by dehiscence for which she was hospitalized at [facility] from 5/12 to 01/23/2024.  BKA was recommended however he declined opting to go to [facility] for an opinion.  He was admitted to [facility] the following day on 5/21 where he remained until 6/5.  Wound cultures grew Proteus and Corynebacterium and he completed linezolid  and Augmentin  today 02/11/2024. Patient states he is in a lot of pain and he is now willing to undergo the previously recommended BKA. In the ED, vitals were within normal limits.  WBC 5.9 with lactic acid 2.2.  Hemoglobin at baseline at 8.3 with baseline thrombocytopenia of 60.  LFTs also at baseline.  X-rays of the left foot suspicious for osteomyelitis. Patient started on Zosyn .  Admission requested   6/9.  Patient seen by vascular surgery and will go to operating room for amputation. 6/10.  Hemoglobin 6.8.  Transfuse 1 unit of packed red blood cells.  Benefits and risk of transfusion explained.  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   Recent OR culture with Proteus and corynebacterium.  Completed linezolid  and Augmentin .   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   Likely postoperative in nature with removal of left lower extremity.  S/p 1 unit RBC transfusion.   Hbg 6.8 this AM --1 unit pRBC's for transfusion, pt consented --Monitor Hbg, transfuse for < 7 --IV iron  infusion given 6/12  Lactic acidosis Continue to monitor.  Could be secondary to infection.  Received a fluid bolus.  Left leg pain Phantom Limb Pain Pain control with oral oxycodone   D/C IV Dilaudid  to assess adequate pain control on PO meds Increase Lyrica  to 200 mg BID - continue  ALC (alcoholic liver cirrhosis) (HCC) History of portal hypertension and esophageal varices.  Hypokalemia Replaced  Alcohol use disorder Continue folate thiamine  and multivitamin.  Anxiety - unclear if chronic vs situational here in hospital & having amputation Vistaril  PRN.  Increased Lyrica  likely to help also. Avoiding benzo's since needing high doses opioids for pain  Chronic hepatitis C (HCC) Follow-up as outpatient.  Obesity (BMI 30-39.9) Will need another weight after amputation.  Last BMI 33.20 which is class I obesity.  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  Continue to monitor closely may end up needing another unit of platelets.  Received platelets prior to amputation. --Monitor CBC's      Subjective: Patient awake sitting up in bed this AM.  Reports burning pain better with increased Lyrica  dose.  Other than pain, pt denies complaints.   Physical Exam: Vitals:  02/16/24 1013 02/16/24 1153 02/16/24 1208 02/16/24 1449  BP: 120/62 116/60 110/61 110/73  Pulse: (!) 106 (!) 104 99 99  Resp: 16 17 18 19   Temp: 98.1 F (36.7 C) 98.3 F (36.8 C) 98.5 F (36.9 C) 98.5 F (36.9 C)  TempSrc: Oral  Oral   SpO2: 100% 100%  100%  Weight:      Height:       General exam: awake, alert, no acute distress HEENT: moist mucus  membranes, hearing grossly normal  Respiratory system: CTAB, no wheezes, rales or rhonchi, normal respiratory effort. Cardiovascular system: normal S1/S2, RRR   Gastrointestinal system: soft, NT, ND, no HSM felt, +bowel sounds. Central nervous system: A&O x3. no gross focal neurologic deficits, normal speech Extremities: L BKA dressing in placee, trace RLE edema Skin: dry, intact, normal temperature Psychiatry: normal mood, congruent affect, judgement and insight appear normal    Data Reviewed: Hemoglobin 6.8 Platelets 44 >> 53     Disposition: Status is: Inpatient Remains inpatient appropriate because: Postoperative day 2 left BKA, acute anemia requiring blood transfusion. Needs SNF placement.    Planned Discharge Destination: Rehab    Time spent: 38 minutes  Author: Montey Apa, DO 02/16/2024 3:44 PM  For on call review [email or web address].

## 2024-02-16 NOTE — Plan of Care (Signed)
   Problem: Clinical Measurements: Goal: Will remain free from infection Outcome: Progressing Goal: Diagnostic test results will improve Outcome: Progressing   Problem: Activity: Goal: Risk for activity intolerance will decrease Outcome: Progressing

## 2024-02-16 NOTE — Progress Notes (Addendum)
 Physical Therapy Treatment Patient Details Name: Derrick Blanchard MRN: [id number] DOB: 08-26-1981 Today's Date: 02/16/2024   History of Present Illness Pt is a 43 year old male with history of alcoholic cirrhosis, hypertension, osteomyelitis DM, osteomyelitis s/p recent transmetatarsal amputation and revision who presented to the emergency department for evaluation of ongoing foot pain. MD Diagnoses includes osteomyelitis of left foot (unspecified type, HCC), acute blood loss anemia, lactic acidosis, ALC, and hypokalemia. Pt is S/P L BKA.    PT Comments  Pt was pleasant and motivated to participate during the session and put forth good effort throughout.  Pt was quite impulsive at time and required frequent cuing to slow down and follow commands to ensure safety.  Pt required min to mod A and cues for sequencing during sit to/from stand transfer training from various height surfaces along with cues for general sequencing.  Pt was able to perform two bouts of gait in the room per below with fair stability with cues needed for general sequencing for safe use of the RW.   Pt demonstrated poor carryover of HEP and positioning education provided during the prior session with review provided.  Pt reported no adverse symptoms during the session other than distal LLE pain.  Pt will benefit from continued PT services upon discharge to safely address deficits listed in patient problem list for decreased caregiver assistance and eventual return to PLOF.  Addendum: Of note, pt's Hgb 6.8 this date.  Per conversation with MD pt with no active bleeding and ok for patient to participate with PT per his tolerance.        If plan is discharge home, recommend the following: A little help with walking and/or transfers;A little help with bathing/dressing/bathroom;Help with stairs or ramp for entrance;Assist for transportation;Assistance with cooking/housework   Can travel by private vehicle     Yes  Equipment  Recommendations  Other (comment) (TBD at next venue of care)    Recommendations for Other Services       Precautions / Restrictions Precautions Precautions: Fall Recall of Precautions/Restrictions: Intact Precaution/Restrictions Comments: L BKA Restrictions Weight Bearing Restrictions Per Provider Order: Yes LLE Weight Bearing Per Provider Order: Non weight bearing Other Position/Activity Restrictions: Very impulsive     Mobility  Bed Mobility Overal bed mobility: Modified Independent             General bed mobility comments: Min extra time, effort, and use of bedrails    Transfers Overall transfer level: Needs assistance Equipment used: Rolling walker (2 wheels) Transfers: Sit to/from Stand Sit to Stand: Mod assist, Min assist           General transfer comment: Mod multi-modal cues for general sequencing and to prevent attempting to stand prior to being safe to do so such as ensuring walker was in place, shoe donned to R foot, pathway cleared to ambulate, etc...    Ambulation/Gait Ambulation/Gait assistance: Contact guard assist Gait Distance (Feet): 8 Feet x 1, 15 Feet x 1 Assistive device: Rolling walker (2 wheels) Gait Pattern/deviations: Trunk flexed Gait velocity: decreased     General Gait Details: Mod multi-modal cues for hop-to sequencing and to stay within the RW during 180 deg turns   Stairs             Wheelchair Mobility     Tilt Bed    Modified Rankin (Stroke Patients Only)       Balance Overall balance assessment: Needs assistance Sitting-balance support: No upper extremity supported, Feet supported Sitting balance-Leahy Scale:  Normal     Standing balance support: Bilateral upper extremity supported, Reliant on assistive device for balance, During functional activity Standing balance-Leahy Scale: Fair                              Hotel manager: No apparent difficulties   Cognition Arousal: Alert Behavior During Therapy: Impulsive   PT - Cognitive impairments: Safety/Judgement                       PT - Cognition Comments: Very impulsive, attempts to mobilize before it is safe to do so Following commands: Impaired Following commands impaired: Follows one step commands with increased time    Cueing Cueing Techniques: Verbal cues, Tactile cues, Visual cues  Exercises Total Joint Exercises Quad Sets: AROM, 10 reps, Strengthening, Both, 5 reps Other Exercises Other Exercises: Positioning education/review to promote L knee ext PROM while at rest Other Exercises: HEP education and review for BLE QS x 10 every 1-2 hours daily    General Comments        Pertinent Vitals/Pain Pain Assessment Pain Assessment: 0-10 Pain Score: 8  Pain Location: residual limb Pain Descriptors / Indicators: Aching, Sore Pain Intervention(s): Repositioned, Premedicated before session, Monitored during session    Home Living                          Prior Function            PT Goals (current goals can now be found in the care plan section) Progress towards PT goals: Progressing toward goals    Frequency    7X/week      PT Plan      Co-evaluation              AM-PAC PT 6 Clicks Mobility   Outcome Measure  Help needed turning from your back to your side while in a flat bed without using bedrails?: A Little Help needed moving from lying on your back to sitting on the side of a flat bed without using bedrails?: A Little Help needed moving to and from a bed to a chair (including a wheelchair)?: A Little Help needed standing up from a chair using your arms (e.g., wheelchair or bedside chair)?: A Little Help needed to walk in hospital room?: A Little Help needed climbing 3-5 steps with a railing? : Total 6 Click Score: 16    End of Session Equipment Utilized During Treatment: Gait belt Activity Tolerance: Patient tolerated  treatment well Patient left: in chair;with call bell/phone within reach;with nursing/sitter in room Nurse Communication: Mobility status (CNA in room for vitals at end of session and stated she would turn on chair alarm) PT Visit Diagnosis: Muscle weakness (generalized) (M62.81);Difficulty in walking, not elsewhere classified (R26.2);Pain;Unsteadiness on feet (R26.81);Other abnormalities of gait and mobility (R26.89) Pain - Right/Left: Left Pain - part of body: Knee;Leg     Time: 1610-9604 PT Time Calculation (min) (ACUTE ONLY): 28 min  Charges:    $Gait Training: 8-22 mins $Therapeutic Activity: 8-22 mins PT General Charges $$ ACUTE PT VISIT: 1 Visit                     D. Scott Nusaybah Ivie PT, DPT 02/16/24, 4:58 PM

## 2024-02-16 NOTE — Progress Notes (Signed)
 Progress Note    02/16/2024 9:45 AM 4 Days Post-Op  Subjective:  Derrick Blanchard is a 43 yo male now POD #3 from left BKA. Patient complains of pain this morning at the site. Dressing is clean dry and intact. No complaints overnight vitals are remained stable.    Vitals:   02/16/24 0449 02/16/24 0755  BP: 123/85 117/61  Pulse: 96 96  Resp: 18 18  Temp: (!) 97.5 F (36.4 C) 98.5 F (36.9 C)  SpO2: 98% 100%   Physical Exam Cardiac:  RRR, normal S1 and S2.  No rubs clicks gallops or murmurs Lungs: Clear throughout on auscultation.  No rales rhonchi or wheezing. Incisions: Left BKA incision clean dry and intact. Extremities: Right lower extremity warm with palpable pulses.  Left lower extremity now with BKA. Abdomen: Positive bowel sounds throughout, soft, nontender nondistended. Neurologic: Alert and oriented x 4, answers all questions and follows commands appropriately   CBC    Component Value Date/Time   WBC 5.2 02/16/2024 0401   RBC 2.63 (L) 02/16/2024 0401   HGB 6.8 (L) 02/16/2024 0401   HGB 12.1 (L) 09/20/2013 1624   HCT 21.9 (L) 02/16/2024 0401   HCT 36.8 (L) 09/20/2013 1624   PLT 53 (L) 02/16/2024 0401   PLT 97 (L) 09/20/2013 1624   MCV 83.3 02/16/2024 0401   MCV 92 09/20/2013 1624   MCH 25.9 (L) 02/16/2024 0401   MCHC 31.1 02/16/2024 0401   RDW 18.5 (H) 02/16/2024 0401   RDW 14.0 09/20/2013 1624   LYMPHSABS 0.8 02/11/2024 1822   LYMPHSABS 1.2 09/20/2013 1624   MONOABS 0.6 02/11/2024 1822   MONOABS 0.5 09/20/2013 1624   EOSABS 0.0 02/11/2024 1822   EOSABS 0.1 09/20/2013 1624   BASOSABS 0.0 02/11/2024 1822   BASOSABS 0.0 09/20/2013 1624    BMET    Component Value Date/Time   NA 137 02/13/2024 0415   NA 144 11/24/2017 1025   NA 138 02/12/2013 0508   K 4.4 02/13/2024 0415   K 4.7 02/12/2013 0508   CL 109 02/13/2024 0415   CL 105 02/12/2013 0508   CO2 20 (L) 02/13/2024 0415   CO2 27 02/12/2013 0508   GLUCOSE 129 (H) 02/13/2024 0415   GLUCOSE  107 (H) 02/12/2013 0508   BUN 14 02/13/2024 0415   BUN 5 (L) 11/24/2017 1025   BUN 16 02/12/2013 0508   CREATININE 0.79 02/13/2024 0415   CREATININE 0.51 (L) 02/12/2013 0508   CALCIUM  8.2 (L) 02/13/2024 0415   CALCIUM  8.3 (L) 02/12/2013 0508   GFRNONAA >60 02/13/2024 0415   GFRNONAA >60 02/12/2013 0508   GFRAA >60 11/07/2018 0153   GFRAA >60 02/12/2013 0508    INR    Component Value Date/Time   INR 1.4 (H) 12/13/2023 1329   INR 1.4 02/07/2013 0137     Intake/Output Summary (Last 24 hours) at 02/16/2024 0945 Last data filed at 02/16/2024 0937 Gross per 24 hour  Intake 225.57 ml  Output 1450 ml  Net -1224.43 ml     Assessment/Plan:  43 y.o. male is s/p Left BKA  4 Days Post-Op   PLAN Pain medication as needed Advance diet as tolerated Continue to work with PT/OT First dressing change has been completed by vascular surgery on today Thursday, 02/15/2024.  Vascular Surgery okay for Patient to be discharged to rehab when medically ready.    DVT prophylaxis:  On hold due to low hemoglobin    Annamaria Barrette [facility] 02/16/2024 9:45  AM

## 2024-02-16 NOTE — NC FL2 (Signed)
 [place]  MEDICAID FL2 LEVEL OF CARE FORM     IDENTIFICATION  Patient Name: Derrick Blanchard Birthdate: Aug 24, 1981 Sex: male Admission Date (Current Location): 02/11/2024  [facility] and [place] Number:  Chiropodist and Address:  [facility], [address], [place], [place] [address]      Provider Number: [id number]  Attending Physician Name and Address:  Montey Apa, DO  Relative Name and Phone Number:  Alpheus Arvin [phone]    Current Level of Care: Hospital Recommended Level of Care: Skilled Nursing Facility Prior Approval Number:    Date Approved/Denied:   PASRR Number: [id number]  Discharge Plan: SNF    Current Diagnoses: Patient Active Problem List   Diagnosis Date Noted   Acute blood loss anemia 02/13/2024   Obesity (BMI 30-39.9) 02/13/2024   Lactic acidosis 02/12/2024   Hypernatremia 02/12/2024   Wound infection 02/11/2024   Generalized weakness 01/16/2024   Wound dehiscence 01/15/2024   Left leg pain 01/15/2024   Complication of foot amputation stump (HCC) 01/06/2024   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) 01/03/2024   Infection of toe as complication of amputation (HCC) 12/27/2023   Wound of left foot 12/13/2023   Open wound of left lower extremity 12/13/2023   Alcohol use disorder 12/13/2023   Osteomyelitis (HCC) 12/13/2023   Hypomagnesemia 03/13/2023   Diarrhea 03/12/2023   Severe sepsis (HCC) 03/11/2023   ALC (alcoholic liver cirrhosis) (HCC) 03/11/2023   Chronic hepatitis C (HCC) 03/11/2023   Cellulitis of left lower extremity 03/11/2023   Ulcer of left foot with muscle involvement without evidence of necrosis (HCC) 03/11/2023   Nausea and vomiting 03/11/2023   Portal hypertension with esophageal varices (HCC) 11/27/2019   Thrombocytopenia, acquired (HCC) 11/27/2019   GERD (gastroesophageal reflux disease) 11/24/2017   Tachycardia 11/24/2017   Hypokalemia 10/25/2017    Thrombocytopenia (HCC) 10/25/2017   Advance care planning 10/25/2017   Depression, major, single episode, moderate (HCC) 10/25/2017   Elevated liver enzymes 09/28/2017   Hyperbilirubinemia    Alcohol use disorder, severe, dependence (HCC) 02/03/2015   Alcoholic hepatitis 01/30/2015   Hematochezia 01/30/2015   Coagulopathy (HCC) 01/30/2015   Hyponatremia 01/30/2015    Orientation RESPIRATION BLADDER Height & Weight     Self, Time, Situation, Place  Normal Continent Weight: 102.3 kg Height:  5' 7 (170.2 cm)  BEHAVIORAL SYMPTOMS/MOOD NEUROLOGICAL BOWEL NUTRITION STATUS      Continent Diet (Regular)  AMBULATORY STATUS COMMUNICATION OF NEEDS Skin   Supervision Verbally Surgical wounds (LLE surgical wound dehiscence)                       Personal Care Assistance Level of Assistance  Bathing, Feeding, Dressing Bathing Assistance: Limited assistance Feeding assistance: Limited assistance Dressing Assistance: Limited assistance     Functional Limitations Info  Hearing, Sight, Speech Sight Info: Adequate Hearing Info: Adequate Speech Info: Adequate    SPECIAL CARE FACTORS FREQUENCY  PT (By licensed PT), OT (By licensed OT)     PT Frequency: 5 times per week OT Frequency: 5 times per week            Contractures      Additional Factors Info  Code Status Code Status Info: FULL Allergies Info: NKDA     Isolation Precautions Info: VRE     Current Medications (02/16/2024):  This is the current hospital active medication list Current Facility-Administered Medications  Medication Dose Route Frequency Provider Last Rate Last Admin  0.9 %  sodium chloride  infusion (Manually program via Guardrails IV Fluids)   Intravenous Once Verla Glaze, MD       0.9 %  sodium chloride  infusion (Manually program via Guardrails IV Fluids)   Intravenous Once Darus Engels A, DO       acetaminophen  (TYLENOL ) tablet 650 mg  650 mg Oral Q6H PRN Dew, Jason S, MD       Or    acetaminophen  (TYLENOL ) suppository 650 mg  650 mg Rectal Q6H PRN Dew, Jason S, MD       calcium  carbonate (TUMS - dosed in mg elemental calcium ) chewable tablet 200 mg of elemental calcium   1 tablet Oral TID PRN Verla Glaze, MD       folic acid  (FOLVITE ) tablet 1 mg  1 mg Oral Daily Dew, Jason S, MD   1 mg at 02/16/24 1610   HYDROmorphone  (DILAUDID ) injection 0.5 mg  0.5 mg Intravenous Q4H PRN Verla Glaze, MD   0.5 mg at 02/15/24 9604   hydrOXYzine  (ATARAX ) tablet 25 mg  25 mg Oral TID PRN Darus Engels A, DO       lactulose  (CHRONULAC ) 10 GM/15ML solution 20 g  20 g Oral Daily Dew, Jason S, MD       multivitamin with minerals tablet 1 tablet  1 tablet Oral Daily Dew, Jason S, MD   1 tablet at 02/16/24 5409   ondansetron  (ZOFRAN ) tablet 4 mg  4 mg Oral Q6H PRN Dew, Jason S, MD       Or   ondansetron  (ZOFRAN ) injection 4 mg  4 mg Intravenous Q6H PRN Dew, Jason S, MD   4 mg at 02/12/24 1615   oxyCODONE  (Oxy IR/ROXICODONE ) immediate release tablet 10-20 mg  10-20 mg Oral Q4H PRN Verla Glaze, MD   20 mg at 02/16/24 8119   pantoprazole  (PROTONIX ) EC tablet 40 mg  40 mg Oral Daily Dew, Jason S, MD   40 mg at 02/16/24 1478   pregabalin  (LYRICA ) capsule 200 mg  200 mg Oral BID Darus Engels A, DO   200 mg at 02/16/24 2956   thiamine  (VITAMIN B1) tablet 100 mg  100 mg Oral Daily Dew, Jason S, MD   100 mg at 02/16/24 2130   Or   thiamine  (VITAMIN B1) injection 100 mg  100 mg Intravenous Daily Dew, Jason S, MD         Discharge Medications: Please see discharge summary for a list of discharge medications.  Relevant Imaging Results:  Relevant Lab Results:   Additional Information [id number]  Elsie Halo, RN

## 2024-02-16 NOTE — TOC Progression Note (Signed)
 Transition of Care [facility]) - Progression Note    Patient Details  Name: RHIAN FUNARI MRN: [id number] Date of Birth: 10/11/1980  Transition of [facility]) CM/SW Contact  Elsie Halo, RN Phone Number: 02/16/2024, 9:07 AM  Clinical Narrative:     CIR declined. TOC initiated SNF bed search. TOC will continue to follow.    Expected Discharge Plan: Skilled Nursing Facility Barriers to Discharge: Continued Medical Work up  Expected Discharge Plan and Services     Post Acute Care Choice: Skilled Nursing Facility, Durable Medical Equipment Living arrangements for the past 2 months: Single Family Home                                       Social Determinants of Health (SDOH) Interventions SDOH Screenings   Food Insecurity: Food Insecurity Present (02/12/2024)  Housing: High Risk (02/12/2024)  Transportation Needs: Unmet Transportation Needs (02/12/2024)  Utilities: Not At Risk (02/12/2024)  Recent Concern: Utilities - At Risk (12/27/2023)  Financial Resource Strain: Medium Risk (01/25/2024)   Received from [facility]  Social Connections: Unknown (01/15/2024)  Tobacco Use: High Risk (02/12/2024)    Readmission Risk Interventions     No data to display

## 2024-02-17 DIAGNOSIS — D696 Thrombocytopenia, unspecified: Secondary | ICD-10-CM | POA: Diagnosis not present

## 2024-02-17 DIAGNOSIS — M79605 Pain in left leg: Secondary | ICD-10-CM | POA: Diagnosis not present

## 2024-02-17 DIAGNOSIS — D62 Acute posthemorrhagic anemia: Secondary | ICD-10-CM | POA: Diagnosis not present

## 2024-02-17 LAB — BPAM RBC
Blood Product Expiration Date: 202507142359
ISSUE DATE / TIME: 202506131147
Unit Type and Rh: 5100

## 2024-02-17 LAB — CBC
HCT: 23.3 % — ABNORMAL LOW (ref 39.0–52.0)
Hemoglobin: 7 g/dL — ABNORMAL LOW (ref 13.0–17.0)
MCH: 25.2 pg — ABNORMAL LOW (ref 26.0–34.0)
MCHC: 30 g/dL (ref 30.0–36.0)
MCV: 83.8 fL (ref 80.0–100.0)
Platelets: 56 10*3/uL — ABNORMAL LOW (ref 150–400)
RBC: 2.78 MIL/uL — ABNORMAL LOW (ref 4.22–5.81)
RDW: 18.9 % — ABNORMAL HIGH (ref 11.5–15.5)
WBC: 4.8 10*3/uL (ref 4.0–10.5)
nRBC: 0 % (ref 0.0–0.2)

## 2024-02-17 LAB — TYPE AND SCREEN
ABO/RH(D): O POS
Antibody Screen: NEGATIVE
Unit division: 0

## 2024-02-17 MED ORDER — IRON SUCROSE 200 MG IVPB - SIMPLE MED
200.0000 mg | Freq: Once | Status: AC
  Administered 2024-02-17: 200 mg via INTRAVENOUS
  Filled 2024-02-17: qty 200

## 2024-02-17 NOTE — Plan of Care (Signed)

## 2024-02-17 NOTE — Progress Notes (Signed)
 Physical Therapy Treatment Patient Details Name: Derrick Blanchard MRN: [id number] DOB: 1980-10-31 Today's Date: 02/17/2024   History of Present Illness Pt is a 43 year old male with history of alcoholic cirrhosis, hypertension, osteomyelitis DM, osteomyelitis s/p recent transmetatarsal amputation and revision who presented to the emergency department for evaluation of ongoing foot pain. MD Diagnoses includes osteomyelitis of left foot (unspecified type, HCC), acute blood loss anemia, lactic acidosis, ALC, and hypokalemia. Pt is S/P L BKA.    PT Comments  Today's tx was a continuation of therex and ambulation to improve overall function. Pt able to perform supine therex with minimal pain, requiring some verbal cueing for proper placement of residual limb. Pt continues to be impulsive during transfers and ambulation, requiring multiple verbal cues for safety and use of RW.  Ambulation with RW x50' successful with no LOB experienced but pt remains unsteady on his R foot with close CGA. Pt will continue to benefit from skilled PT interventions to improve QoL. PT to follow acutely.    If plan is discharge home, recommend the following: A little help with walking and/or transfers;A little help with bathing/dressing/bathroom;Help with stairs or ramp for entrance;Assist for transportation;Assistance with cooking/housework   Can travel by private vehicle     Yes  Equipment Recommendations  Other (comment)    Recommendations for Other Services       Precautions / Restrictions Precautions Precautions: Fall Recall of Precautions/Restrictions: Intact Precaution/Restrictions Comments: L BKA Restrictions Weight Bearing Restrictions Per Provider Order: Yes LLE Weight Bearing Per Provider Order: Non weight bearing Other Position/Activity Restrictions: Very impulsive     Mobility  Bed Mobility Overal bed mobility: Modified Independent             General bed mobility comments: Min extra  time, effort, and use of bedrails    Transfers Overall transfer level: Needs assistance Equipment used: Rolling walker (2 wheels) Transfers: Sit to/from Stand Sit to Stand: Min assist           General transfer comment: verbal and tactile cues for use of RW and for safety, no LOB experienced but increased time required to perform intital STS    Ambulation/Gait Ambulation/Gait assistance: Min assist, Contact guard assist Gait Distance (Feet): 40 Feet Assistive device: Rolling walker (2 wheels) Gait Pattern/deviations: Trunk flexed       General Gait Details: mutltimodal cues throughout for safety and manipulation of RW, verbal cueing for steppage of R foot, no LOB experienced   Stairs             Wheelchair Mobility     Tilt Bed    Modified Rankin (Stroke Patients Only)       Balance Overall balance assessment: Needs assistance Sitting-balance support: No upper extremity supported, Feet supported Sitting balance-Leahy Scale: Normal Sitting balance - Comments: sitting EOB   Standing balance support: Bilateral upper extremity supported, Reliant on assistive device for balance, During functional activity Standing balance-Leahy Scale: Fair Standing balance comment: verbal cues for safety and manipulation of RW, pt does not like to use gait belt                            Communication Communication Communication: No apparent difficulties  Cognition Arousal: Alert Behavior During Therapy: Impulsive   PT - Cognitive impairments: Safety/Judgement                       PT - Cognition Comments: Very impulsive,  attempts to mobilize before it is safe to do so Following commands: Impaired Following commands impaired: Follows one step commands with increased time    Cueing Cueing Techniques: Verbal cues, Tactile cues, Visual cues  Exercises Total Joint Exercises Quad Sets: AROM, Both, 10 reps, Supine General Exercises - Lower  Extremity Short Arc Quad: AROM, Both, 10 reps, Supine Other Exercises Other Exercises: Assisted pt to go to bathroom and gave new underwear Other Exercises: Gave pt paper scrubs for comfort, pt ended up with gown at the end    General Comments        Pertinent Vitals/Pain Pain Assessment Pain Assessment: 0-10 Pain Score: 8  Pain Location: residual limb Pain Descriptors / Indicators: Aching, Sore Pain Intervention(s): RN gave pain meds during session    Home Living                          Prior Function            PT Goals (current goals can now be found in the care plan section) Acute Rehab PT Goals Patient Stated Goal: to be able to walk to the grocery store with no pain PT Goal Formulation: With patient Time For Goal Achievement: 02/27/24 Potential to Achieve Goals: Good Progress towards PT goals: Progressing toward goals    Frequency    7X/week      PT Plan      Co-evaluation     PT goals addressed during session: Mobility/safety with mobility;Proper use of DME        AM-PAC PT 6 Clicks Mobility   Outcome Measure  Help needed turning from your back to your side while in a flat bed without using bedrails?: A Little Help needed moving from lying on your back to sitting on the side of a flat bed without using bedrails?: A Little Help needed moving to and from a bed to a chair (including a wheelchair)?: A Little Help needed standing up from a chair using your arms (e.g., wheelchair or bedside chair)?: A Little Help needed to walk in hospital room?: A Lot Help needed climbing 3-5 steps with a railing? : Total 6 Click Score: 15    End of Session Equipment Utilized During Treatment: Gait belt Activity Tolerance: Patient tolerated treatment well Patient left: in bed;with call bell/phone within reach;with bed alarm set Nurse Communication: Mobility status PT Visit Diagnosis: Muscle weakness (generalized) (M62.81);Difficulty in walking, not  elsewhere classified (R26.2);Pain;Unsteadiness on feet (R26.81);Other abnormalities of gait and mobility (R26.89) Pain - Right/Left: Left Pain - part of body: Knee;Leg     Time: 1040-1120 PT Time Calculation (min) (ACUTE ONLY): 40 min  Charges:    $Gait Training: 8-22 mins $Therapeutic Exercise: 8-22 mins $Therapeutic Activity: 8-22 mins PT General Charges $$ ACUTE PT VISIT: 1 Visit                       Raphel Stickles Romero-Perozo, SPT  02/17/2024, 1:55 PM

## 2024-02-17 NOTE — Plan of Care (Signed)
  Problem: Clinical Measurements: Goal: Ability to maintain clinical measurements within normal limits will improve Outcome: Progressing Goal: Will remain free from infection Outcome: Not Progressing Goal: Diagnostic test results will improve Outcome: Not Progressing

## 2024-02-17 NOTE — Progress Notes (Signed)
 Progress Note   Patient: Derrick Blanchard ZOX:[id number] DOB: February 05, 1981 DOA: 02/11/2024     6 DOS: the patient was seen and examined on 02/17/2024   Brief hospital course: 43 y.o. male with medical history significant for Being admitted with pain and bleeding from a left TMA for which he recently underwent revision at [facility] on 5/23, discharged on 02/08/2024.  Triage note states that he was brought in by EMS from the side of the road with a complaint that the wound was bleeding.  Patient has a history of alcohol use disorder and housing insecurity, depression, metALD with cirrhosis, portal hypertension and esophageal varices, chronic hepatitis C,  prior right TMA.  He had left TMA 12/2023 which was complicated by dehiscence for which she was hospitalized at [facility] from 5/12 to 01/23/2024.  BKA was recommended however he declined opting to go to [facility] for an opinion.  He was admitted to [facility] the following day on 5/21 where he remained until 6/5.  Wound cultures grew Proteus and Corynebacterium and he completed linezolid  and Augmentin  today 02/11/2024. Patient states he is in a lot of pain and he is now willing to undergo the previously recommended BKA. In the ED, vitals were within normal limits.  WBC 5.9 with lactic acid 2.2.  Hemoglobin at baseline at 8.3 with baseline thrombocytopenia of 60.  LFTs also at baseline.  X-rays of the left foot suspicious for osteomyelitis. Patient started on Zosyn .  Admission requested   6/9.  Patient seen by vascular surgery and will go to operating room for amputation. 6/10.  Hemoglobin 6.8.  Transfuse 1 unit of packed red blood cells.  Benefits and risk of transfusion explained.  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   Recent OR culture with Proteus and corynebacterium.  Completed linezolid  and Augmentin .   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  Likely postoperative in nature with removal of left lower extremity.   S/p 2 units RBC transfusion to date, last on 6/13 for Hbg 6.8. Hbg 7.0 this AM --Monitor Hbg, transfuse for < 7 --IV iron  infusion given 6/12, repeat today 6/14  Lactic acidosis Continue to monitor.  Could be secondary to infection.  Received a fluid bolus.  Left leg pain Phantom Limb Pain Pain control with oral oxycodone   D/C IV Dilaudid  to assess adequate pain control on PO meds Increased Lyrica  to 200 mg BID - continue  ALC (alcoholic liver cirrhosis) (HCC) History of portal hypertension and esophageal varices.  Hypokalemia Replaced  Alcohol use disorder Continue folate thiamine  and multivitamin.  Anxiety - unclear if chronic vs situational here in hospital & having amputation Vistaril  PRN.  Increased Lyrica  likely to help also. Avoiding benzo's since needing high doses opioids for pain  Chronic hepatitis C (HCC) Follow-up as outpatient.  Obesity (BMI 30-39.9) Will need another weight after amputation.  Last BMI 33.20 which is class I obesity.  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  Continue to monitor closely may end up needing another unit of platelets.  Received platelets prior to amputation. --Monitor CBC's      Subjective: Patient awake sitting up in bed this AM.  Reports pain better since increased Lyrica .  Had some bleeding from the amputation site overnight.  Dressing changed this  AM by vascular per pt.  He denies acute complaints.     Physical Exam: Vitals:   02/17/24 0425 02/17/24 0500 02/17/24 0827 02/17/24 1438  BP: 115/68  125/70 120/70  Pulse: (!) 105  (!) 104 (!) 106  Resp: 18  16 16   Temp: 98 F (36.7 C)  99.4 F (37.4 C) 98 F (36.7 C)  TempSrc:      SpO2: 99%  98% 100%  Weight:  103.2 kg    Height:       General exam:  awake, alert, no acute distress HEENT: moist mucus membranes, hearing grossly normal  Respiratory system: on room air, normal respiratory effort. Cardiovascular system: RRR, trace RLE edema stable Gastrointestinal system: soft, NT, ND, no HSM felt, +bowel sounds. Central nervous system: A&O x3. no gross focal neurologic deficits, normal speech Extremities: L BKA dressing in placee, trace RLE edema and venous stasis hyperpigmentation Skin: dry, intact, normal temperature Psychiatry: normal mood, congruent affect, judgement and insight appear normal    Data Reviewed: Hemoglobin 6.8 (1 unit RBC's) >> 7.0 Platelets 44 >> 53 >> 56     Disposition: Status is: Inpatient Remains inpatient appropriate because: acute anemia requiring blood transfusions. Needs SNF placement.    Planned Discharge Destination: Rehab    Time spent: 35 minutes  Author: Montey Apa, DO 02/17/2024 2:48 PM  For on call review [email or web address].

## 2024-02-18 DIAGNOSIS — D62 Acute posthemorrhagic anemia: Secondary | ICD-10-CM | POA: Diagnosis not present

## 2024-02-18 DIAGNOSIS — M79605 Pain in left leg: Secondary | ICD-10-CM | POA: Diagnosis not present

## 2024-02-18 DIAGNOSIS — D696 Thrombocytopenia, unspecified: Secondary | ICD-10-CM | POA: Diagnosis not present

## 2024-02-18 LAB — CBC
HCT: 24.8 % — ABNORMAL LOW (ref 39.0–52.0)
Hemoglobin: 7.5 g/dL — ABNORMAL LOW (ref 13.0–17.0)
MCH: 25.2 pg — ABNORMAL LOW (ref 26.0–34.0)
MCHC: 30.2 g/dL (ref 30.0–36.0)
MCV: 83.2 fL (ref 80.0–100.0)
Platelets: 64 10*3/uL — ABNORMAL LOW (ref 150–400)
RBC: 2.98 MIL/uL — ABNORMAL LOW (ref 4.22–5.81)
RDW: 19.1 % — ABNORMAL HIGH (ref 11.5–15.5)
WBC: 4.9 10*3/uL (ref 4.0–10.5)
nRBC: 0 % (ref 0.0–0.2)

## 2024-02-18 NOTE — Progress Notes (Signed)
      Daily Progress Note   Assessment/Planning:   POD #6 s/p L BKA  Serous drainage on BKA dsg Pt will likely need daily dsg   Subjective  - 6 Days Post-Op   Pain moderately controlled   Objective   Vitals:   02/17/24 1525 02/17/24 2029 02/18/24 0300 02/18/24 0722  BP: 118/68 110/65 116/77 112/68  Pulse: 93 (!) 101 100 (!) 102  Resp: 16 18  17   Temp: 98.5 F (36.9 C) 98.6 F (37 C) 98.8 F (37.1 C) 98.8 F (37.1 C)  TempSrc:   Oral   SpO2: 99% 100% 100% 100%  Weight:      Height:         Intake/Output Summary (Last 24 hours) at 02/18/2024 0803 Last data filed at 02/18/2024 0300 Gross per 24 hour  Intake 400 ml  Output 2700 ml  Net -2300 ml    VASC L BKA: edematous, inc c/d/I, serous drainage on dsg    Laboratory   CBC    Latest Ref Rng & Units 02/18/2024    3:43 AM 02/17/2024    1:53 AM 02/16/2024    4:01 AM  CBC  WBC 4.0 - 10.5 K/uL 4.9  4.8  5.2   Hemoglobin 13.0 - 17.0 g/dL 7.5  7.0  6.8   Hematocrit 39.0 - 52.0 % 24.8  23.3  21.9   Platelets 150 - 400 K/uL 64  56  53     BMET    Component Value Date/Time   NA 137 02/13/2024 0415   NA 144 11/24/2017 1025   NA 138 02/12/2013 0508   K 4.4 02/13/2024 0415   K 4.7 02/12/2013 0508   CL 109 02/13/2024 0415   CL 105 02/12/2013 0508   CO2 20 (L) 02/13/2024 0415   CO2 27 02/12/2013 0508   GLUCOSE 129 (H) 02/13/2024 0415   GLUCOSE 107 (H) 02/12/2013 0508   BUN 14 02/13/2024 0415   BUN 5 (L) 11/24/2017 1025   BUN 16 02/12/2013 0508   CREATININE 0.79 02/13/2024 0415   CREATININE 0.51 (L) 02/12/2013 0508   CALCIUM  8.2 (L) 02/13/2024 0415   CALCIUM  8.3 (L) 02/12/2013 0508   GFRNONAA >60 02/13/2024 0415   GFRNONAA >60 02/12/2013 0508   GFRAA >60 11/07/2018 0153   GFRAA >60 02/12/2013 4782     Roxy Cordial, MD, FACS, FSVS Covering for [facility]: [phone]  02/18/2024, 8:03 AM

## 2024-02-18 NOTE — Plan of Care (Signed)

## 2024-02-18 NOTE — Progress Notes (Signed)
 Progress Note   Patient: Derrick Blanchard [id number]:[id number] DOB: [birth year] DOA: 02/11/2024     7 DOS: the patient was seen and examined on 02/18/2024   Brief hospital course: 43 y.o. male with medical history significant for Being admitted with pain and bleeding from a left TMA for which he recently underwent revision at [facility] on 5/23, discharged on 02/08/2024.  Triage note states that he was brought in by EMS from the side of the road with a complaint that the wound was bleeding.  Patient has a history of alcohol use disorder and housing insecurity, depression, metALD with cirrhosis, portal hypertension and esophageal varices, chronic hepatitis C,  prior right TMA.  He had left TMA 12/2023 which was complicated by dehiscence for which she was hospitalized at [facility] from 5/12 to 01/23/2024.  BKA was recommended however he declined opting to go to [facility] for an opinion.  He was admitted to [facility] the following day on 5/21 where he remained until 6/5.  Wound cultures grew Proteus and Corynebacterium and he completed linezolid  and Augmentin  today 02/11/2024. Patient states he is in a lot of pain and he is now willing to undergo the previously recommended BKA. In the ED, vitals were within normal limits.  WBC 5.9 with lactic acid 2.2.  Hemoglobin at baseline at 8.3 with baseline thrombocytopenia of 60.  LFTs also at baseline.  X-rays of the left foot suspicious for osteomyelitis. Patient started on Zosyn .  Admission requested   6/9.  Patient seen by vascular surgery and will go to operating room for amputation. 6/10.  Hemoglobin 6.8.  Transfuse 1 unit of packed red blood cells.  Benefits and risk of transfusion explained.  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   Recent OR culture with Proteus and corynebacterium.  Completed linezolid  and Augmentin .   Patient given Zosyn  and daptomycin  in ED Left BKA by Dr. Vonna Guardian on 02/12/24   --Stop Zosyn  - no longer needing antibiotics with  infection source removed  Acute blood loss anemia Iron  deficiency Likely postoperative in nature with removal of left lower extremity.   S/p 2 units RBC transfusion to date, last on 6/13 for Hbg 6.8. S/p 2 IV iron  infusions Hbg 7.0 >> 7.5 this AM --Monitor Hbg, transfuse for < 7  Lactic acidosis Continue to monitor.  Could be secondary to infection.  Received a fluid bolus.  Left leg pain Phantom Limb Pain Pain control with oral oxycodone   D/C IV Dilaudid  to assess adequate pain control on PO meds Increased Lyrica  to 200 mg BID - continue  ALC (alcoholic liver cirrhosis) (HCC) History of portal hypertension and esophageal varices.  Hypokalemia Replaced  Alcohol use disorder Continue folate thiamine  and multivitamin.  Anxiety - unclear if chronic vs situational here in hospital & having amputation Vistaril  PRN.  Increased Lyrica  likely to help also. Avoiding benzo's since needing high doses opioids for pain  Chronic hepatitis C (HCC) Follow-up as outpatient.  Obesity (BMI 30-39.9) Will need another weight after amputation.  Last BMI 33.20 which is class I obesity.  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  Continue to monitor closely may end up needing another unit of platelets.  Received platelets prior to amputation. --Monitor CBC's      Subjective: Patient awake sitting up in bed this AM.  Denies acute complaints.  Slept well overnight after getting Atarax .     Physical Exam: Vitals:   02/17/24 2029 02/18/24 0300 02/18/24 0722 02/18/24 1408  BP: 110/65 116/77 112/68 115/69  Pulse: (!) 101 100 (!) 102 89  Resp: 18  17 17   Temp: 98.6 F (37 C) 98.8 F (37.1 C) 98.8 F (37.1 C) 98.5 F (36.9 C)  TempSrc:  Oral  Oral  SpO2: 100% 100% 100% 100%  Weight:      Height:       General exam: awake, alert, no acute distress Respiratory system: on room air, normal respiratory effort. Cardiovascular system: RRR, trace RLE  edema stable Gastrointestinal system: soft, NT, ND, no HSM felt, +bowel sounds. Central nervous system: A&O x3. no gross focal neurologic deficits, normal speech Extremities: L BKA dressing in placee, trace RLE edema and venous stasis hyperpigmentation Skin: dry, intact, normal temperature Psychiatry: normal mood, congruent affect, judgement and insight appear normal    Data Reviewed: Hemoglobin 6.8 (1 unit RBC's) >> 7.0 >> 7.5 Platelets 44 >> 53 >> 56 >> 64     Disposition: Status is: Inpatient Remains inpatient appropriate because: acute anemia requiring blood transfusions. Needs SNF placement.    Planned Discharge Destination: Rehab    Time spent: 35 minutes  Author: Montey Apa, DO 02/18/2024 3:35 PM  For on call review [email or web address].

## 2024-02-18 NOTE — Progress Notes (Signed)
 Physical Therapy Treatment Patient Details Name: Derrick Blanchard MRN: [id number] DOB: 1981/08/04 Today's Date: 02/18/2024   History of Present Illness Pt is a 43 year old male with history of alcoholic cirrhosis, hypertension, osteomyelitis DM, osteomyelitis s/p recent transmetatarsal amputation and revision who presented to the emergency department for evaluation of ongoing foot pain. MD Diagnoses includes osteomyelitis of left foot (unspecified type, HCC), acute blood loss anemia, lactic acidosis, ALC, and hypokalemia. Pt is S/P L BKA.    PT Comments  Pt in bed.  C/o inc pain today but agrees to session.  He does supine AROM with encouragement to inc knee flexion as he is hesitant to bend L knee but does so after education.  Pt takes off ace wrap during session and it is re-wrapped before mobility.  Mod I bed mobility and generally steady in sitting.  He stands to RW with min a x 1 and keeps R foot outside of walker box while trying to pull up pants.  Cues and education given but generally unsteady and unsafe in standing and asked to sit down.  On next 2 stands he does better and is able to hop left/right along bed with heavy lean on walker.  General decreased safety so gait is not progressed at this time given fall risk and +1 assist.     If plan is discharge home, recommend the following: A little help with walking and/or transfers;A little help with bathing/dressing/bathroom;Help with stairs or ramp for entrance;Assist for transportation;Assistance with cooking/housework   Can travel by private vehicle     Yes  Equipment Recommendations  Other (comment)    Recommendations for Other Services       Precautions / Restrictions Precautions Precautions: Fall Recall of Precautions/Restrictions: Intact Precaution/Restrictions Comments: L BKA Restrictions Weight Bearing Restrictions Per Provider Order: Yes LLE Weight Bearing Per Provider Order: Non weight bearing Other Position/Activity  Restrictions: Very impulsive     Mobility  Bed Mobility Overal bed mobility: Modified Independent               Patient Response: Cooperative, Impulsive  Transfers Overall transfer level: Needs assistance Equipment used: Rolling walker (2 wheels) Transfers: Sit to/from Stand Sit to Stand: Min assist           General transfer comment: cues for safety    Ambulation/Gait Ambulation/Gait assistance: Min assist, Contact guard assist Gait Distance (Feet): 10 Feet Assistive device: Rolling walker (2 wheels) Gait Pattern/deviations: Step-to pattern Gait velocity: decreased     General Gait Details: generally impuslive and poor safety with standing and hopping left/right at edge of bed   Stairs             Wheelchair Mobility     Tilt Bed Tilt Bed Patient Response: Cooperative, Impulsive  Modified Rankin (Stroke Patients Only)       Balance Overall balance assessment: Needs assistance Sitting-balance support: No upper extremity supported, Feet supported Sitting balance-Leahy Scale: Normal Sitting balance - Comments: sitting EOB   Standing balance support: Bilateral upper extremity supported, Reliant on assistive device for balance, During functional activity Standing balance-Leahy Scale: Fair Standing balance comment: generally unsafe at times.  did not resist gait belt today                            Communication Communication Communication: No apparent difficulties  Cognition Arousal: Alert Behavior During Therapy: Impulsive   PT - Cognitive impairments: Safety/Judgement  Following commands: Impaired Following commands impaired: Follows one step commands inconsistently    Cueing Cueing Techniques: Verbal cues, Tactile cues, Visual cues  Exercises Other Exercises Other Exercises: re-wrapped stump as pt removed ace wrap during session    General Comments        Pertinent Vitals/Pain Pain  Assessment Pain Assessment: Faces Faces Pain Scale: Hurts even more Pain Location: residual limb Pain Descriptors / Indicators: Aching, Sore Pain Intervention(s): Monitored during session, Premedicated before session, Repositioned, Limited activity within patient's tolerance    Home Living                          Prior Function            PT Goals (current goals can now be found in the care plan section) Progress towards PT goals: Progressing toward goals    Frequency    7X/week      PT Plan      Co-evaluation              AM-PAC PT 6 Clicks Mobility   Outcome Measure  Help needed turning from your back to your side while in a flat bed without using bedrails?: A Little Help needed moving from lying on your back to sitting on the side of a flat bed without using bedrails?: A Little Help needed moving to and from a bed to a chair (including a wheelchair)?: A Little Help needed standing up from a chair using your arms (e.g., wheelchair or bedside chair)?: A Little Help needed to walk in hospital room?: A Lot Help needed climbing 3-5 steps with a railing? : Total 6 Click Score: 15    End of Session Equipment Utilized During Treatment: Gait belt Activity Tolerance: Patient tolerated treatment well Patient left: in bed;with call bell/phone within reach;with bed alarm set Nurse Communication: Mobility status PT Visit Diagnosis: Muscle weakness (generalized) (M62.81);Difficulty in walking, not elsewhere classified (R26.2);Pain;Unsteadiness on feet (R26.81);Other abnormalities of gait and mobility (R26.89) Pain - Right/Left: Left Pain - part of body: Knee;Leg     Time: 1610-9604 PT Time Calculation (min) (ACUTE ONLY): 25 min  Charges:    $Therapeutic Activity: 23-37 mins PT General Charges $$ ACUTE PT VISIT: 1 Visit                   Charlanne Cong, PTA 02/18/24, 12:53 PM

## 2024-02-19 DIAGNOSIS — M79605 Pain in left leg: Secondary | ICD-10-CM | POA: Diagnosis not present

## 2024-02-19 DIAGNOSIS — D696 Thrombocytopenia, unspecified: Secondary | ICD-10-CM | POA: Diagnosis not present

## 2024-02-19 DIAGNOSIS — D62 Acute posthemorrhagic anemia: Secondary | ICD-10-CM | POA: Diagnosis not present

## 2024-02-19 DIAGNOSIS — T148XXA Other injury of unspecified body region, initial encounter: Secondary | ICD-10-CM | POA: Diagnosis not present

## 2024-02-19 LAB — CBC
HCT: 23.8 % — ABNORMAL LOW (ref 39.0–52.0)
Hemoglobin: 7.2 g/dL — ABNORMAL LOW (ref 13.0–17.0)
MCH: 25.4 pg — ABNORMAL LOW (ref 26.0–34.0)
MCHC: 30.3 g/dL (ref 30.0–36.0)
MCV: 84.1 fL (ref 80.0–100.0)
Platelets: 69 10*3/uL — ABNORMAL LOW (ref 150–400)
RBC: 2.83 MIL/uL — ABNORMAL LOW (ref 4.22–5.81)
RDW: 19.9 % — ABNORMAL HIGH (ref 11.5–15.5)
WBC: 4.4 10*3/uL (ref 4.0–10.5)
nRBC: 0 % (ref 0.0–0.2)

## 2024-02-19 LAB — BASIC METABOLIC PANEL WITH GFR
Anion gap: 6 (ref 5–15)
BUN: 11 mg/dL (ref 6–20)
CO2: 26 mmol/L (ref 22–32)
Calcium: 8.6 mg/dL — ABNORMAL LOW (ref 8.9–10.3)
Chloride: 108 mmol/L (ref 98–111)
Creatinine, Ser: 0.53 mg/dL — ABNORMAL LOW (ref 0.61–1.24)
GFR, Estimated: >60 mL/min (ref >60)
Glucose, Bld: 109 mg/dL — ABNORMAL HIGH (ref 70–99)
Potassium: 3.9 mmol/L (ref 3.5–5.1)
Sodium: 140 mmol/L (ref 135–145)

## 2024-02-19 MED ORDER — NYSTATIN 100000 UNIT/GM EX POWD
Freq: Two times a day (BID) | CUTANEOUS | Status: DC
  Filled 2024-02-19: qty 15

## 2024-02-19 NOTE — TOC Progression Note (Signed)
 Transition of Care [facility]) - Progression Note    Patient Details  Name: Derrick Blanchard MRN: [id number] Date of Birth: June 20, 1981  Transition of Care [facility]) CM/SW Contact  Odilia Bennett, LCSW Phone Number: 02/19/2024, 9:33 AM  Clinical Narrative:  CSW started SNF search.   Expected Discharge Plan: Skilled Nursing Facility Barriers to Discharge: Continued Medical Work up  Expected Discharge Plan and Services     Post Acute Care Choice: Skilled Nursing Facility, Durable Medical Equipment Living arrangements for the past 2 months: Single Family Home                                       Social Determinants of Health (SDOH) Interventions SDOH Screenings   Food Insecurity: Food Insecurity Present (02/12/2024)  Housing: High Risk (02/12/2024)  Transportation Needs: Unmet Transportation Needs (02/12/2024)  Utilities: Not At Risk (02/12/2024)  Recent Concern: Utilities - At Risk (12/27/2023)  Financial Resource Strain: Medium Risk (01/25/2024)   Received from [facility]  Social Connections: Unknown (01/15/2024)  Tobacco Use: High Risk (02/12/2024)    Readmission Risk Interventions     No data to display

## 2024-02-19 NOTE — Progress Notes (Signed)
 Occupational Therapy Treatment Patient Details Name: Derrick Blanchard MRN: [id number] DOB: [birth year] Today's Date: 02/19/2024   History of present illness Pt is a 43 year old male with history of alcoholic cirrhosis, hypertension, osteomyelitis DM, osteomyelitis s/p recent transmetatarsal amputation and revision who presented to the emergency department for evaluation of ongoing foot pain. MD Diagnoses includes osteomyelitis of left foot (unspecified type, HCC), acute blood loss anemia, lactic acidosis, ALC, and hypokalemia. Pt is S/P L BKA.   OT comments  Pt received sitting EOB with PTA, benefits from +2 for safety due to impulsivity and questionable insight into deficits. LB dressing completed with supervision, reclines in bed and bridges at hips to don pants and dons R shoe seated EOB. Pt able to complete bed mobility mod independent with ease, CGA +2 for functional STS transfers and mobility using hopping pattern and UE support on RW. Pt often tangential, requiring redirection to task with poor sustained attention. When he does focus, mobility and safety improves. Edu given to pt regarding positioning for residual limb. Discharge recommendation updated, patient will benefit from continued inpatient follow up therapy, <3 hours/day.       If plan is discharge home, recommend the following:  A little help with walking and/or transfers;A little help with bathing/dressing/bathroom;Assist for transportation;Help with stairs or ramp for entrance;Assistance with cooking/housework   Equipment Recommendations  Wheelchair (measurements OT);BSC/3in1       Precautions / Restrictions Precautions Precautions: Fall Recall of Precautions/Restrictions: Intact Precaution/Restrictions Comments: L BKA Restrictions Weight Bearing Restrictions Per Provider Order: Yes LLE Weight Bearing Per Provider Order: Non weight bearing Other Position/Activity Restrictions: Very impulsive, poor insight        Mobility Bed Mobility Overal bed mobility: Modified Independent             General bed mobility comments: exits from both sides of bed, decreased safety awareness as pt leans over bedside face first to attempt to pick up his ice bucket from floor    Transfers Overall transfer level: Needs assistance Equipment used: Rolling walker (2 wheels), None Transfers: Sit to/from Stand, Bed to chair/wheelchair/BSC Sit to Stand: Contact guard assist   Squat pivot transfers: Supervision       General transfer comment: cues for safety     Balance Overall balance assessment: Needs assistance Sitting-balance support: No upper extremity supported, Feet supported Sitting balance-Leahy Scale: Normal     Standing balance support: Bilateral upper extremity supported, Reliant on assistive device for balance, During functional activity Standing balance-Leahy Scale: Fair                             ADL either performed or assessed with clinical judgement   ADL Overall ADL's : Needs assistance/impaired                     Lower Body Dressing: Supervision/safety;Sitting/lateral leans Lower Body Dressing Details (indicate cue type and reason): donn R shoe, dons pants, bridges at hips to pull pants over             Functional mobility during ADLs: +2 for safety/equipment;Contact guard assist General ADL Comments: impulsive, requires redirection for task attn     Communication Communication Communication: No apparent difficulties   Cognition Arousal: Alert Behavior During Therapy: Impulsive Cognition: No apparent impairments  Following commands: Impaired Following commands impaired: Follows one step commands inconsistently      Cueing   Cueing Techniques: Verbal cues, Tactile cues, Visual cues        General Comments BKA wrapped with ACE bandage, edu on positioning for future prosthesis    Pertinent Vitals/  Pain       Pain Assessment Pain Assessment: Faces Faces Pain Scale: Hurts little more Pain Location: residual limb Pain Descriptors / Indicators: Aching, Sore Pain Intervention(s): Repositioned, Monitored during session, Limited activity within patient's tolerance         Frequency  Min 3X/week        Progress Toward Goals  OT Goals(current goals can now be found in the care plan section)  Progress towards OT goals: Progressing toward goals  Acute Rehab OT Goals OT Goal Formulation: With patient Time For Goal Achievement: 02/29/24 Potential to Achieve Goals: Good  Plan      Co-evaluation    PT/OT/SLP Co-Evaluation/Treatment: Yes Reason for Co-Treatment: For patient/therapist safety PT goals addressed during session: Mobility/safety with mobility;Proper use of DME OT goals addressed during session: ADL's and self-care      AM-PAC OT 6 Clicks Daily Activity     Outcome Measure   Help from another person eating meals?: None Help from another person taking care of personal grooming?: None Help from another person toileting, which includes using toliet, bedpan, or urinal?: A Little Help from another person bathing (including washing, rinsing, drying)?: None Help from another person to put on and taking off regular upper body clothing?: None Help from another person to put on and taking off regular lower body clothing?: A Little 6 Click Score: 22    End of Session Equipment Utilized During Treatment: Rolling walker (2 wheels);Gait belt  OT Visit Diagnosis: Other abnormalities of gait and mobility (R26.89);Muscle weakness (generalized) (M62.81);Unsteadiness on feet (R26.81) Pain - Right/Left: Left Pain - part of body: Ankle and joints of foot;Leg;Knee   Activity Tolerance Patient tolerated treatment well   Patient Left in chair;with call bell/phone within reach;with chair alarm set   Nurse Communication Mobility status        Time: 1610-9604 OT Time  Calculation (min): 29 min  Charges: OT General Charges $OT Visit: 1 Visit OT Treatments $Self Care/Home Management : 8-22 mins  Filiberto Wamble L. Hallel Denherder, OTR/L  02/19/24, 1:16 PM

## 2024-02-19 NOTE — Plan of Care (Signed)

## 2024-02-19 NOTE — Progress Notes (Addendum)
 Physical Therapy Treatment Patient Details Name: Derrick Blanchard MRN: [id number] DOB: 1980/11/26 Today's Date: 02/19/2024   History of Present Illness Pt is a 43 year old male with history of alcoholic cirrhosis, hypertension, osteomyelitis DM, osteomyelitis s/p recent transmetatarsal amputation and revision who presented to the emergency department for evaluation of ongoing foot pain. MD Diagnoses includes osteomyelitis of left foot (unspecified type, HCC), acute blood loss anemia, lactic acidosis, ALC, and hypokalemia. Pt is S/P L BKA.    PT Comments  Pt in bed, ready for session. Mod I bed mobility through out session on left and right side of bed with ease.  Generally steady in sitting static and dynamic.  A second ace wrap was found over one I applied yesterday.  He wrapped it on his own in a circular pattern around L stump.  Educated on proper wrapper pattern and technique to allow for edema control and stump formation in preparation for prothesis.  Pt had wrapped it in a way to encouarage dog ears and poor edema control.  Pt voiced understanding and encouraged to ask staff for help and guidance with wrapping if needed.  He stands to RW with cga x 2 and is able to progress hop-to gait with RW and cga x 2 for general safety.  Pt remains impulsive and generally inattentive to tasks at times.  When he focuses on gait he does do fairly well but often is talking about unrelated topics that take his focus off task.  Some general fatigue with activity but does squat pivot to recliner without AD and supervision at end of session and remains up in chair with needs met.  Supine and seated AROM and stretching along with education for positioning.    Co-tx with OT for pt and staff safety with mobility.   If plan is discharge home, recommend the following: A little help with walking and/or transfers;A little help with bathing/dressing/bathroom;Help with stairs or ramp for entrance;Assist for  transportation;Assistance with cooking/housework   Can travel by private vehicle     Yes  Equipment Recommendations  Other (comment)    Recommendations for Other Services       Precautions / Restrictions Precautions Precautions: Fall Recall of Precautions/Restrictions: Intact Precaution/Restrictions Comments: L BKA Restrictions Weight Bearing Restrictions Per Provider Order: Yes LLE Weight Bearing Per Provider Order: Non weight bearing Other Position/Activity Restrictions: Very impulsive     Mobility  Bed Mobility Overal bed mobility: Modified Independent               Patient Response: Cooperative, Impulsive  Transfers Overall transfer level: Needs assistance Equipment used: Rolling walker (2 wheels), None Transfers: Sit to/from Stand, Bed to chair/wheelchair/BSC Sit to Stand: Contact guard assist, Min assist     Squat pivot transfers: Supervision          Ambulation/Gait Ambulation/Gait assistance: Contact guard assist, +2 safety/equipment Gait Distance (Feet): 30 Feet Assistive device: Rolling walker (2 wheels) Gait Pattern/deviations: Step-to pattern Gait velocity: decreased     General Gait Details: able to walk a short distance in hallway today   Stairs             Wheelchair Mobility     Tilt Bed Tilt Bed Patient Response: Cooperative, Impulsive  Modified Rankin (Stroke Patients Only)       Balance  Communication Communication Communication: No apparent difficulties  Cognition Arousal: Alert Behavior During Therapy: Impulsive   PT - Cognitive impairments: Safety/Judgement                       PT - Cognition Comments: Very impulsive, when he stops talking and focuses he does fair but when attention is elsewhere safety deficits are more evident Following commands: Impaired Following commands impaired: Follows one step commands inconsistently     Cueing Cueing Techniques: Verbal cues, Tactile cues, Visual cues  Exercises Other Exercises Other Exercises: re-wrapped stump as pt had wrapped another ace over dressing in a circular pattern Other Exercises: seated and supine education for HEP and stretching    General Comments        Pertinent Vitals/Pain Pain Assessment Pain Assessment: Faces Faces Pain Scale: Hurts little more Pain Location: residual limb Pain Descriptors / Indicators: Aching, Sore Pain Intervention(s): Monitored during session, Limited activity within patient's tolerance, Repositioned    Home Living                          Prior Function            PT Goals (current goals can now be found in the care plan section) Progress towards PT goals: Progressing toward goals    Frequency    7X/week      PT Plan      Co-evaluation PT/OT/SLP Co-Evaluation/Treatment: Yes Reason for Co-Treatment: For patient/therapist safety PT goals addressed during session: Mobility/safety with mobility;Proper use of DME OT goals addressed during session: ADL's and self-care      AM-PAC PT 6 Clicks Mobility   Outcome Measure  Help needed turning from your back to your side while in a flat bed without using bedrails?: A Little Help needed moving from lying on your back to sitting on the side of a flat bed without using bedrails?: A Little Help needed moving to and from a bed to a chair (including a wheelchair)?: A Little Help needed standing up from a chair using your arms (e.g., wheelchair or bedside chair)?: A Little Help needed to walk in hospital room?: A Little Help needed climbing 3-5 steps with a railing? : Total 6 Click Score: 16    End of Session Equipment Utilized During Treatment: Gait belt Activity Tolerance: Patient tolerated treatment well Patient left: with call bell/phone within reach;in chair;with chair alarm set Nurse Communication: Mobility status PT Visit Diagnosis: Muscle  weakness (generalized) (M62.81);Difficulty in walking, not elsewhere classified (R26.2);Pain;Unsteadiness on feet (R26.81);Other abnormalities of gait and mobility (R26.89) Pain - Right/Left: Left Pain - part of body: Knee;Leg     Time: 7829-5621 PT Time Calculation (min) (ACUTE ONLY): 41 min  Charges:    $Gait Training: 8-22 mins $Therapeutic Exercise: 8-22 mins PT General Charges $$ ACUTE PT VISIT: 1 Visit                   Charlanne Cong, PTA 02/19/24, 11:44 AM

## 2024-02-19 NOTE — Progress Notes (Signed)
 Progress Note   Patient: Derrick Blanchard UJW:[id number] DOB: 1981-03-04 DOA: 02/11/2024     8 DOS: the patient was seen and examined on 02/19/2024   Brief hospital course: 43 y.o. male with medical history significant for Being admitted with pain and bleeding from a left TMA for which he recently underwent revision at [facility] on 5/23, discharged on 02/08/2024.  Triage note states that he was brought in by EMS from the side of the road with a complaint that the wound was bleeding.  Patient has a history of alcohol use disorder and housing insecurity, depression, metALD with cirrhosis, portal hypertension and esophageal varices, chronic hepatitis C,  prior right TMA.  He had left TMA 12/2023 which was complicated by dehiscence for which she was hospitalized at [facility] from 5/12 to 01/23/2024.  BKA was recommended however he declined opting to go to [facility] for an opinion.  He was admitted to [facility] the following day on 5/21 where he remained until 6/5.  Wound cultures grew Proteus and Corynebacterium and he completed linezolid  and Augmentin  today 02/11/2024. Patient states he is in a lot of pain and he is now willing to undergo the previously recommended BKA. In the ED, vitals were within normal limits.  WBC 5.9 with lactic acid 2.2.  Hemoglobin at baseline at 8.3 with baseline thrombocytopenia of 60.  LFTs also at baseline.  X-rays of the left foot suspicious for osteomyelitis. Patient started on Zosyn .  Admission requested   6/9.  Patient seen by vascular surgery and will go to operating room for amputation. 6/10.  Hemoglobin 6.8.  Transfuse 1 unit of packed red blood cells.  Benefits and risk of transfusion explained.  Assessment and Plan: * Osteomyelitis left lower extremity Status post Left BKA Of left transmet amputation site.   Recent OR culture with Proteus and corynebacterium.   Completed linezolid  and Augmentin .   Patient given Zosyn  and daptomycin  in ED Left BKA by Dr. Vonna Guardian on 02/12/24   Completed  Zosyn  - off antibiotics Monitor Pain  Acute blood loss anemia Iron  deficiency Likely postoperative in nature with removal of left lower extremity.   S/p 2 units RBC transfusion to date, last on 6/13 for Hbg 6.8. S/p 2 IV iron  infusions Hbg 7.0 >> 7.5 >> 7.2 this AM --Monitor Hbg, transfuse for < 7  Lactic acidosis Continue to monitor.  Could be secondary to infection.  Received a fluid bolus.  Left leg pain Phantom Limb Pain Pain control with oral oxycodone   D/C IV Dilaudid  to assess adequate pain control on PO meds Increased Lyrica  to 200 mg BID - continue  ALC (alcoholic liver cirrhosis) (HCC) History of portal hypertension and esophageal varices.  Hypokalemia Replaced  Alcohol use disorder Continue folate thiamine  and multivitamin.  Anxiety - unclear if chronic vs situational here in hospital & having amputation Vistaril  PRN.  Increased Lyrica  likely to help also. Avoiding benzo's since needing high doses opioids for pain  Chronic hepatitis C (HCC) Follow-up as outpatient.  Obesity (BMI 30-39.9) Will need another weight after amputation.  Last BMI 33.20 which is class I obesity.  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  Continue to monitor closely may end up needing another unit of platelets.  Received platelets prior to amputation. --Monitor CBC's      Subjective: Patient awake sitting up in bed this AM.  Reports pain at BKA site overnight, had to remove dressings for a while.  Reports some fluid drainage and bleeding but not excessive.  No other acute complaints aside from pain.  Wants to do more with PT.   Physical Exam: Vitals:   02/18/24 2044 02/19/24 0344 02/19/24 0600 02/19/24 0956  BP: 111/71 115/67  (!) 114/101  Pulse: (!) 103 97  98  Resp: 20 20  20   Temp: 98.2 F (36.8 C) 98.8 F (37.1 C)  98.7 F (37.1 C)  TempSrc:      SpO2: 100% 98%  98%  Weight:   103 kg   Height:       General exam: awake, alert,  no acute distress Respiratory system: on room air, normal respiratory effort. Cardiovascular system: RRR, trace RLE edema stable Gastrointestinal system: soft, NT, ND Central nervous system: A&O x3. no gross focal neurologic deficits, normal speech Extremities: L BKA dressing in placee, trace RLE edema and venous stasis hyperpigmentation Skin: dry, intact, normal temperature Psychiatry: normal mood, congruent affect, judgement and insight appear normal    Data Reviewed: Hemoglobin 6.8 (1 unit RBC's) >> 7.0 >> 7.5 >> 7.2 Platelets 44 >> 53 >> 56 >> 64 >> 69  BMP with glucose 109, Cr 0.53, Ca 8.6     Disposition: Status is: Inpatient Remains inpatient appropriate because: acute anemia requiring blood transfusions. Needs SNF placement.    Planned Discharge Destination: Rehab    Time spent: 35 minutes  Author: Montey Apa, DO 02/19/2024 12:45 PM  For on call review [email or web address].

## 2024-02-20 DIAGNOSIS — D62 Acute posthemorrhagic anemia: Secondary | ICD-10-CM | POA: Diagnosis not present

## 2024-02-20 DIAGNOSIS — D696 Thrombocytopenia, unspecified: Secondary | ICD-10-CM | POA: Diagnosis not present

## 2024-02-20 DIAGNOSIS — T148XXA Other injury of unspecified body region, initial encounter: Secondary | ICD-10-CM | POA: Diagnosis not present

## 2024-02-20 DIAGNOSIS — M79605 Pain in left leg: Secondary | ICD-10-CM | POA: Diagnosis not present

## 2024-02-20 LAB — CBC
HCT: 26 % — ABNORMAL LOW (ref 39.0–52.0)
Hemoglobin: 7.9 g/dL — ABNORMAL LOW (ref 13.0–17.0)
MCH: 25.6 pg — ABNORMAL LOW (ref 26.0–34.0)
MCHC: 30.4 g/dL (ref 30.0–36.0)
MCV: 84.4 fL (ref 80.0–100.0)
Platelets: 70 10*3/uL — ABNORMAL LOW (ref 150–400)
RBC: 3.08 MIL/uL — ABNORMAL LOW (ref 4.22–5.81)
RDW: 20.5 % — ABNORMAL HIGH (ref 11.5–15.5)
WBC: 6.2 10*3/uL (ref 4.0–10.5)
nRBC: 0 % (ref 0.0–0.2)

## 2024-02-20 NOTE — Plan of Care (Signed)

## 2024-02-20 NOTE — Progress Notes (Signed)
 Progress Note   Patient: Derrick Blanchard [name]:[id number] DOB: [birth year] DOA: 02/11/2024     9 DOS: the patient was seen and examined on 02/20/2024   Brief hospital course: 43 y.o. male with medical history significant for Being admitted with pain and bleeding from a left TMA for which he recently underwent revision at [facility] on 5/23, discharged on 02/08/2024.  Triage note states that he was brought in by EMS from the side of the road with a complaint that the wound was bleeding.  Patient has a history of alcohol use disorder and housing insecurity, depression, metALD with cirrhosis, portal hypertension and esophageal varices, chronic hepatitis C,  prior right TMA.  He had left TMA 12/2023 which was complicated by dehiscence for which she was hospitalized at [facility] from 5/12 to 01/23/2024.  BKA was recommended however he declined opting to go to [facility] for an opinion.  He was admitted to [facility] the following day on 5/21 where he remained until 6/5.  Wound cultures grew Proteus and Corynebacterium and he completed linezolid  and Augmentin  today 02/11/2024. Patient states he is in a lot of pain and he is now willing to undergo the previously recommended BKA. In the ED, vitals were within normal limits.  WBC 5.9 with lactic acid 2.2.  Hemoglobin at baseline at 8.3 with baseline thrombocytopenia of 60.  LFTs also at baseline.  X-rays of the left foot suspicious for osteomyelitis. Patient started on Zosyn .  Admission requested   6/9.  Patient seen by vascular surgery and will go to operating room for amputation. 6/10.  Hemoglobin 6.8.  Transfuse 1 unit of packed red blood cells.  Benefits and risk of transfusion explained.  ----- I assumed care on 02/14/24. 6/11--17: pt stable, adjust pain regimen, increased Lyrica  for phantom pain, two IV iron  infusions given, 1 unit blood transfusion on 6/13.    Assessment and Plan: * Osteomyelitis left lower extremity Status post Left BKA Of left transmet amputation site.    Recent OR culture with Proteus and corynebacterium.   Completed linezolid  and Augmentin .   Patient given Zosyn  and daptomycin  in ED Left BKA by Dr. Vonna Guardian on 02/12/24   Completed Zosyn  - off antibiotics Monitor Pain  Acute blood loss anemia Iron  deficiency Likely postoperative in nature with removal of left lower extremity.   S/p 2 units RBC transfusion to date, last on 6/13 for Hbg 6.8. S/p 2 IV iron  infusions Hbg 7.0 >> 7.5 >> 7.2  >> 7.9 this AM --Monitor Hbg, transfuse for < 7  Left leg pain - initially due to above Phantom Limb Pain Pain control with oral oxycodone   D/C IV Dilaudid  to assess adequate pain control on PO meds Increased Lyrica  to 200 mg BID - continue & titrate dose  Lactic acidosis - s/p IV fluids  ALC (alcoholic liver cirrhosis) (HCC) History of portal hypertension and esophageal varices.  Hypokalemia Replaced  Alcohol use disorder Continue folate thiamine  and multivitamin.  Anxiety - unclear if chronic vs situational here in hospital & having amputation Vistaril  PRN.  Increased Lyrica  likely to help also. Avoiding benzo's since needing high doses opioids for pain  Chronic hepatitis C (HCC) Follow-up as outpatient.  Obesity (BMI 30-39.9) Will need another weight after amputation.  Last BMI 33.20 which is class I obesity.  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  Continue to monitor closely may end up needing another unit of platelets.  Received platelets prior to amputation. --Monitor CBC's      Subjective:  Patient awake sitting up in bed this AM.  Reports overall pain controlled but asks for IV med to be re-ordered.  Denies other complaints or issues.     Physical Exam: Vitals:   02/19/24 1933 02/20/24 0500 02/20/24 0526 02/20/24 0829  BP: 117/61  119/70 120/65  Pulse: 91  98 87  Resp: 18  18 18   Temp: 98.6 F (37 C)  98.3 F (36.8 C) 97.8 F (36.6 C)  TempSrc:    Oral  SpO2: 100%  96% 100%   Weight:  99.2 kg    Height:       General exam: awake, alert, no acute distress Respiratory system: on room air, normal respiratory effort. Cardiovascular system: RRR, trace RLE edema stable Gastrointestinal system: soft, NT, ND Central nervous system: A&O x3. no gross focal neurologic deficits, normal speech Extremities: L BKA dressing in placee, trace RLE edema and venous stasis hyperpigmentation Skin: dry, intact, normal temperature Psychiatry: normal mood, congruent affect, judgement and insight appear normal    Data Reviewed:  Hemoglobin 6.8 (1 unit RBC's) >> 7.0 >> 7.5 >> 7.2 >> 7.9 today Platelets 44 >> 53 >> 56 >> 64 >> 69 >> 70   Last BMP 6/16 with glucose 109, Cr 0.53, Ca 8.6     Disposition: Status is: Inpatient Remains inpatient appropriate because: closely monitoring anemia requiring blood transfusions.  Needs SNF placement.    Planned Discharge Destination: Rehab    Time spent: 35 minutes  Author: Montey Apa, DO 02/20/2024 1:54 PM  For on call review [email or web address].

## 2024-02-20 NOTE — Progress Notes (Signed)
 Physical Therapy Treatment Patient Details Name: Derrick Blanchard MRN: [id number] DOB: [birth year] Today's Date: 02/20/2024   History of Present Illness Pt is a 43 year old male with history of alcoholic cirrhosis, hypertension, osteomyelitis DM, osteomyelitis s/p recent transmetatarsal amputation and revision who presented to the emergency department for evaluation of ongoing foot pain. MD Diagnoses includes osteomyelitis of left foot (unspecified type, HCC), acute blood loss anemia, lactic acidosis, ALC, and hypokalemia. Pt is S/P L BKA.    PT Comments  Pt ready for session  dons pants and shoes independently.  He stands to RW with cga/min a x 2 and is able to walk 45' in a small circle out of room to hallway.  Pt does better today focusing on tasks which improves safety and balance.  He does keep his L hip quite flexed during gait often hitting thigh on front of walker bar.  Cues to relax hip and for stretching.  He is progressing with L knee flexion and is able to achieve full range but he often sits in bed in a crossed leg type position and education is given for knee and hip ROM preservation.  He does state he nearly fell yesterday while sitting EOB when he went to reach forward for something that was too far away and :forgot his leg is removed.  Discussion on awareness had.  Pt will benefit from < 3 hrs of therapy at discharge given safety and balance deficits.     If plan is discharge home, recommend the following: A little help with walking and/or transfers;A little help with bathing/dressing/bathroom;Help with stairs or ramp for entrance;Assist for transportation;Assistance with cooking/housework   Can travel by private vehicle     Yes  Equipment Recommendations  Other (comment)    Recommendations for Other Services       Precautions / Restrictions Precautions Precautions: Fall Recall of Precautions/Restrictions: Intact Precaution/Restrictions Comments: L  BKA Restrictions Weight Bearing Restrictions Per Provider Order: Yes LLE Weight Bearing Per Provider Order: Non weight bearing Other Position/Activity Restrictions: Very impulsive, poor insight     Mobility  Bed Mobility Overal bed mobility: Modified Independent             General bed mobility comments: exits from both sides of bed, decreased safety awareness as pt leans over bedside face first to attempt to pick up his ice bucket from floor Patient Response: Cooperative, Impulsive  Transfers Overall transfer level: Needs assistance Equipment used: Rolling walker (2 wheels), None Transfers: Sit to/from Stand, Bed to chair/wheelchair/BSC Sit to Stand: Contact guard assist     Squat pivot transfers: Supervision     General transfer comment: cues for safety    Ambulation/Gait Ambulation/Gait assistance: Contact guard assist, +2 safety/equipment Gait Distance (Feet): 45 Feet Assistive device: Rolling walker (2 wheels) Gait Pattern/deviations: Step-to pattern Gait velocity: decreased     General Gait Details: small lap in hallway with +2 assist for safety.   Stairs             Wheelchair Mobility     Tilt Bed Tilt Bed Patient Response: Cooperative, Impulsive  Modified Rankin (Stroke Patients Only)       Balance Overall balance assessment: Needs assistance Sitting-balance support: No upper extremity supported, Feet supported Sitting balance-Leahy Scale: Normal     Standing balance support: Bilateral upper extremity supported, Reliant on assistive device for balance, During functional activity Standing balance-Leahy Scale: Fair Standing balance comment: generally unsafe at times.  did not resist gait belt today  Communication Communication Communication: No apparent difficulties  Cognition Arousal: Alert Behavior During Therapy: Impulsive                           PT - Cognition Comments: did  better today with cues to focus on task but still +2 for safety Following commands: Impaired Following commands impaired: Follows one step commands inconsistently    Cueing Cueing Techniques: Verbal cues, Tactile cues, Visual cues  Exercises Other Exercises Other Exercises: re-wrapped stump Other Exercises: seated and supine education for HEP and stretching    General Comments        Pertinent Vitals/Pain Pain Assessment Pain Assessment: Faces Faces Pain Scale: Hurts little more Pain Location: residual limb Pain Descriptors / Indicators: Aching, Sore Pain Intervention(s): Limited activity within patient's tolerance, Monitored during session, Repositioned    Home Living                          Prior Function            PT Goals (current goals can now be found in the care plan section) Progress towards PT goals: Progressing toward goals    Frequency    7X/week      PT Plan      Co-evaluation              AM-PAC PT 6 Clicks Mobility   Outcome Measure  Help needed turning from your back to your side while in a flat bed without using bedrails?: A Little Help needed moving from lying on your back to sitting on the side of a flat bed without using bedrails?: A Little Help needed moving to and from a bed to a chair (including a wheelchair)?: A Little Help needed standing up from a chair using your arms (e.g., wheelchair or bedside chair)?: A Little Help needed to walk in hospital room?: A Little Help needed climbing 3-5 steps with a railing? : Total 6 Click Score: 16    End of Session Equipment Utilized During Treatment: Gait belt Activity Tolerance: Patient tolerated treatment well Patient left: with call bell/phone within reach;in chair;with chair alarm set Nurse Communication: Mobility status PT Visit Diagnosis: Muscle weakness (generalized) (M62.81);Difficulty in walking, not elsewhere classified (R26.2);Pain;Unsteadiness on feet  (R26.81);Other abnormalities of gait and mobility (R26.89) Pain - Right/Left: Left Pain - part of body: Knee;Leg     Time: 1610-9604 PT Time Calculation (min) (ACUTE ONLY): 29 min  Charges:    $Gait Training: 8-22 mins $Therapeutic Exercise: 8-22 mins PT General Charges $$ ACUTE PT VISIT: 1 Visit                   Charlanne Cong, PTA 02/20/24, 11:34 AM

## 2024-02-20 NOTE — TOC Progression Note (Signed)
 Transition of Care [facility]) - Progression Note    Patient Details  Name: Derrick Blanchard MRN: [id number] Date of Birth: 07/17/1981  [facility] CM/SW Contact  Alexandra Ice, RN Phone Number: 02/20/2024, 4:00 PM  Clinical Narrative:    Met with patient at bedside, provided patient with list of bed offers. List printed of medicare.gov website. Will follow up with patient in the morning with his choice.   Expected Discharge Plan: Skilled Nursing Facility Barriers to Discharge: Continued Medical Work up  Expected Discharge Plan and Services     Post Acute Care Choice: Skilled Nursing Facility, Durable Medical Equipment Living arrangements for the past 2 months: Single Family Home                                       Social Determinants of Health (SDOH) Interventions SDOH Screenings   Food Insecurity: Food Insecurity Present (02/12/2024)  Housing: High Risk (02/12/2024)  Transportation Needs: Unmet Transportation Needs (02/12/2024)  Utilities: Not At Risk (02/12/2024)  Recent Concern: Utilities - At Risk (12/27/2023)  Financial Resource Strain: Medium Risk (01/25/2024)   Received from [facility]  Social Connections: Unknown (01/15/2024)  Tobacco Use: High Risk (02/12/2024)    Readmission Risk Interventions     No data to display

## 2024-02-21 DIAGNOSIS — M86072 Acute hematogenous osteomyelitis, left ankle and foot: Secondary | ICD-10-CM | POA: Diagnosis not present

## 2024-02-21 LAB — CBC
HCT: 26.6 % — ABNORMAL LOW (ref 39.0–52.0)
Hemoglobin: 8.2 g/dL — ABNORMAL LOW (ref 13.0–17.0)
MCH: 26.5 pg (ref 26.0–34.0)
MCHC: 30.8 g/dL (ref 30.0–36.0)
MCV: 85.8 fL (ref 80.0–100.0)
Platelets: 67 10*3/uL — ABNORMAL LOW (ref 150–400)
RBC: 3.1 MIL/uL — ABNORMAL LOW (ref 4.22–5.81)
RDW: 20.5 % — ABNORMAL HIGH (ref 11.5–15.5)
WBC: 4.1 10*3/uL (ref 4.0–10.5)
nRBC: 0 % (ref 0.0–0.2)

## 2024-02-21 MED ORDER — POLYETHYLENE GLYCOL 3350 17 G PO PACK
17.0000 g | PACK | Freq: Every day | ORAL | Status: DC
  Administered 2024-02-21 – 2024-02-22 (×2): 17 g via ORAL
  Filled 2024-02-21 (×2): qty 1

## 2024-02-21 NOTE — TOC Progression Note (Signed)
 Transition of Care [facility]) - Progression Note    Patient Details  Name: Derrick Blanchard MRN: [id number] Date of Birth: [birth year]  Transition of [facility]) CM/SW Contact  Alexandra Ice, RN Phone Number: 02/21/2024, 9:59 AM  Clinical Narrative:    Met with patient at bedside to see if he had made a choice. He asked for TOC to comeback later today.   Expected Discharge Plan: Skilled Nursing Facility Barriers to Discharge: Continued Medical Work up  Expected Discharge Plan and Services     Post Acute Care Choice: Skilled Nursing Facility, Durable Medical Equipment Living arrangements for the past 2 months: Single Family Home                                       Social Determinants of Health (SDOH) Interventions SDOH Screenings   Food Insecurity: Food Insecurity Present (02/12/2024)  Housing: High Risk (02/12/2024)  Transportation Needs: Unmet Transportation Needs (02/12/2024)  Utilities: Not At Risk (02/12/2024)  Recent Concern: Utilities - At Risk (12/27/2023)  Financial Resource Strain: Medium Risk (01/25/2024)   Received from [facility]  Social Connections: Unknown (01/15/2024)  Tobacco Use: High Risk (02/12/2024)    Readmission Risk Interventions     No data to display

## 2024-02-21 NOTE — Plan of Care (Signed)

## 2024-02-21 NOTE — Progress Notes (Signed)
 Occupational Therapy Treatment Patient Details Name: Derrick Blanchard MRN: [id number] DOB: 1981-01-26 Today's Date: 02/21/2024   History of present illness Pt is a 43 year old male with history of alcoholic cirrhosis, hypertension, osteomyelitis DM, osteomyelitis s/p recent transmetatarsal amputation and revision who presented to the emergency department for evaluation of ongoing foot pain. MD Diagnoses includes osteomyelitis of left foot (unspecified type, HCC), acute blood loss anemia, lactic acidosis, ALC, and hypokalemia. Pt is S/P L BKA.   OT comments  Chart reviewed to date, pt had just finished with PT, but is agreeable to OT tx session targeting improving safe, independent ADL performance. Pt performs bathing via sink bath with MIN A for UB, MOD A for LB esp for peri area due to noted redness/skin irritation. Educated pt on wrapping/LLE positioning/ residual limb care/management in prep for shrinker. Pt is making progress towards goals, discharge recommendation remains appropriate. OT will continue to follow.       If plan is discharge home, recommend the following:  A little help with walking and/or transfers;A little help with bathing/dressing/bathroom;Assist for transportation;Help with stairs or ramp for entrance;Assistance with Charity fundraiser (measurements OT);BSC/3in1    Recommendations for Other Services      Precautions / Restrictions Precautions Precautions: Fall Recall of Precautions/Restrictions: Intact Precaution/Restrictions Comments: L BKA       Mobility Bed Mobility Overal bed mobility: Modified Independent                  Transfers                         Balance Overall balance assessment: Needs assistance Sitting-balance support: No upper extremity supported, Feet supported Sitting balance-Leahy Scale: Normal Sitting balance - Comments: lateral scoots up the bed with supervision-CGA                                    ADL either performed or assessed with clinical judgement   ADL Overall ADL's : Needs assistance/impaired Eating/Feeding: Set up;Sitting   Grooming: Wash/dry hands;Wash/dry face;Sitting;Set up;Brushing hair   Upper Body Bathing: Minimal assistance;Sitting   Lower Body Bathing: Moderate assistance;Sitting/lateral leans Lower Body Bathing Details (indicate cue type and reason): for thoroughness due to breakdown/irritation under pannus and in peri area Upper Body Dressing : Sitting;Supervision/safety                          Extremity/Trunk Assessment              Vision       Perception     Praxis     Communication Communication Communication: No apparent difficulties   Cognition Arousal: Alert Behavior During Therapy: Impulsive Cognition: No apparent impairments                               Following commands: Impaired Following commands impaired: Follows one step commands with increased time, Follows multi-step commands inconsistently      Cueing   Cueing Techniques: Verbal cues, Tactile cues, Visual cues  Exercises Other Exercises Other Exercises: edu re: role of OT, discussed discharge recommendations    Shoulder Instructions       General Comments ACE bandage re-wrapped via figure 8 wrapping with noted saturation on dressing- nurse notified    Pertinent Vitals/ Pain  Pain Assessment Pain Assessment: Faces Faces Pain Scale: Hurts little more  Home Living                                          Prior Functioning/Environment              Frequency           Progress Toward Goals  OT Goals(current goals can now be found in the care plan section)  Progress towards OT goals: Progressing toward goals  Acute Rehab OT Goals Time For Goal Achievement: 02/29/24  Plan      Co-evaluation                 AM-PAC OT 6 Clicks Daily Activity      Outcome Measure   Help from another person eating meals?: None Help from another person taking care of personal grooming?: None Help from another person toileting, which includes using toliet, bedpan, or urinal?: A Little Help from another person bathing (including washing, rinsing, drying)?: A Little Help from another person to put on and taking off regular upper body clothing?: None Help from another person to put on and taking off regular lower body clothing?: A Little 6 Click Score: 21    End of Session    OT Visit Diagnosis: Other abnormalities of gait and mobility (R26.89);Muscle weakness (generalized) (M62.81);Unsteadiness on feet (R26.81)   Activity Tolerance Patient tolerated treatment well   Patient Left in bed;with call bell/phone within reach   Nurse Communication Mobility status        Time: 1610-9604 OT Time Calculation (min): 24 min  Charges: OT General Charges $OT Visit: 1 Visit OT Treatments $Self Care/Home Management : 23-37 mins  Gerre Kraft, OTD OTR/L  02/21/24, 3:33 PM

## 2024-02-21 NOTE — Progress Notes (Signed)
 Progress Note   Patient: Derrick Blanchard ZOX:[id number] DOB: Mar 03, 1981 DOA: 02/11/2024     10 DOS: the patient was seen and examined on 02/21/2024      Brief hospital course: 43 y.o. male with medical history significant for Being admitted with pain and bleeding from a left TMA for which he recently underwent revision at [facility] on 5/23, discharged on 02/08/2024.  Triage note states that he was [place] in by EMS [place] the side of the road with a complaint that the wound was bleeding.  Patient has a history of alcohol use disorder and housing insecurity, depression, metALD with cirrhosis, portal hypertension and esophageal varices, chronic hepatitis C,  prior right TMA.  He had left TMA 12/2023 which was complicated by dehiscence for which she was hospitalized at [facility] from 5/12 to 01/23/2024.  BKA was recommended however he declined opting to go to [facility] for an opinion.  He was admitted to [facility] the following day on 5/21 where he remained until 6/5.  Wound cultures grew Proteus and Corynebacterium and he completed linezolid  and Augmentin  today 02/11/2024. Patient states he is in a lot of pain and he is now willing to undergo the previously recommended BKA. In the ED, vitals were within normal limits.  WBC 5.9 with lactic acid 2.2.  Hemoglobin at baseline at 8.3 with baseline thrombocytopenia of 60.  LFTs also at baseline.  X-rays of the left foot suspicious for osteomyelitis. Patient started on Zosyn .  Admission requested    6/9.  Patient seen by vascular surgery and will go to operating room for amputation. 6/10.  Hemoglobin 6.8.  Transfuse 1 unit of packed red blood cells.  Benefits and risk of transfusion explained.   ----- I assumed care on 02/14/24. 6/11--17: pt stable, adjust pain regimen, increased Lyrica  for phantom pain, two IV iron  infusions given, 1 unit blood transfusion on 6/13.       Assessment and Plan: * Osteomyelitis left lower extremity Status post Left BKA Of left transmet  amputation site.   Recent OR culture with Proteus and corynebacterium.   Completed linezolid  and Augmentin .   Patient given Zosyn  and daptomycin  in ED Left BKA by Dr. Vonna Guardian on 02/12/24   Completed Zosyn  - off antibiotics Currently awaiting skilled nursing facility placement   Acute blood loss anemia Iron  deficiency Likely postoperative in nature with removal of left lower extremity.   S/p 2 units RBC transfusion to date, last on 6/13 for Hbg 6.8. S/p 2 IV iron  infusions Continue to monitor hemoglobin closely and transfuse as needed to maintain hemoglobin greater than 7   Left leg pain - initially due to above Phantom Limb Pain Pain control with oral oxycodone   D/C IV Dilaudid  to assess adequate pain control on PO meds Increased Lyrica  to 200 mg BID - continue & titrate dose   Lactic acidosis - s/p IV fluids   ALC (alcoholic liver cirrhosis) (HCC) History of portal hypertension and esophageal varices.   Hypokalemia Replaced   Alcohol use disorder Continue folate thiamine  and multivitamin.   Anxiety - unclear if chronic vs situational here in hospital & having amputation Vistaril  PRN.  Increased Lyrica  likely to help also. Avoiding benzo's since needing high doses opioids for pain   Chronic hepatitis C (HCC) Follow-up as outpatient.   Obesity (BMI 30-39.9) Will need another weight after amputation.  Last BMI 33.20 which is class I obesity.   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  Continue to monitor closely may  end up needing another unit of platelets.  Received platelets prior to amputation. --Monitor CBC's         Subjective:  Patient seen and examined at bedside this morning He admits to pain involving the amputated limb Denies nausea vomiting abdominal pain or chest pain   Physical Exam: General exam: awake, alert, no acute distress Respiratory system: on room air, normal respiratory effort. Cardiovascular system: RRR,  trace RLE edema stable Gastrointestinal system: soft, NT, ND Central nervous system: A&O x3. no gross focal neurologic deficits, normal speech Extremities: L BKA dressing in placee, trace RLE edema and venous stasis hyperpigmentation Skin: dry, intact, normal temperature Psychiatry: normal mood, congruent affect, judgement and insight appear normal     Data Reviewed:    Latest Ref Rng & Units 02/21/2024    4:02 AM 02/20/2024    3:47 AM 02/19/2024    1:26 AM  CBC  WBC 4.0 - 10.5 K/uL 4.1  6.2  4.4   Hemoglobin 13.0 - 17.0 g/dL 8.2  7.9  7.2   Hematocrit 39.0 - 52.0 % 26.6  26.0  23.8   Platelets 150 - 400 K/uL 67  70  69        Latest Ref Rng & Units 02/19/2024    1:26 AM 02/13/2024    4:15 AM 02/12/2024    1:27 AM  BMP  Glucose 70 - 99 mg/dL 161  096  045   BUN 6 - 20 mg/dL 11  14  7    Creatinine 0.61 - 1.24 mg/dL 4.09  8.11  9.14   Sodium 135 - 145 mmol/L 140  137  146   Potassium 3.5 - 5.1 mmol/L 3.9  4.4  3.3   Chloride 98 - 111 mmol/L 108  109  117   CO2 22 - 32 mmol/L 26  20  22    Calcium  8.9 - 10.3 mg/dL 8.6  8.2  8.5       Disposition: Status is: Inpatient Remains inpatient appropriate because: closely monitoring anemia requiring blood transfusions.  Needs SNF placement.      Planned Discharge Destination: Rehab     Vitals:   02/21/24 0444 02/21/24 0500 02/21/24 0839 02/21/24 1549  BP: 106/69  119/71 113/71  Pulse: 95  89 98  Resp: 18  19 17   Temp: (!) 97.5 F (36.4 C)  98.5 F (36.9 C) 97.8 F (36.6 C)  TempSrc:   Oral Oral  SpO2: 100%  100% 99%  Weight:  102.1 kg    Height:         Author: Ezzard Holms, MD 02/21/2024 5:00 PM  For on call review [email or web address].

## 2024-02-21 NOTE — Progress Notes (Signed)
 Physical Therapy Treatment Patient Details Name: Derrick Blanchard MRN: [id number] DOB: [birth year] Today's Date: 02/21/2024   History of Present Illness Pt is a 43 year old male with history of alcoholic cirrhosis, hypertension, osteomyelitis DM, osteomyelitis s/p recent transmetatarsal amputation and revision who presented to the emergency department for evaluation of ongoing foot pain. MD Diagnoses includes osteomyelitis of left foot (unspecified type, HCC), acute blood loss anemia, lactic acidosis, ALC, and hypokalemia. Pt is S/P L BKA.    PT Comments  Patient alert, agreeable to PT, some questionable safety awareness noted (use of towel rack instead of grab bar despite education, hand washing, steadying choices, don/doffing lower body dressing a bit impulsively). ModI for bed mobility, CGA to hop out into hallway ~16ft with RW, and after a seated rest break hopped ~67ft with RW to bathroom. Pt reported to bed with needs in reach. The patient would benefit from further skilled PT intervention to continue to progress towards goals.     If plan is discharge home, recommend the following: A little help with walking and/or transfers;A little help with bathing/dressing/bathroom;Help with stairs or ramp for entrance;Assist for transportation;Assistance with cooking/housework   Can travel by private vehicle     Yes  Equipment Recommendations  Other (comment)    Recommendations for Other Services       Precautions / Restrictions Precautions Precautions: Fall Recall of Precautions/Restrictions: Intact Precaution/Restrictions Comments: L BKA Restrictions Other Position/Activity Restrictions: Very impulsive, poor insight     Mobility  Bed Mobility Overal bed mobility: Modified Independent                  Transfers Overall transfer level: Needs assistance Equipment used: Rolling walker (2 wheels) Transfers: Sit to/from Stand Sit to Stand: Contact guard assist            General transfer comment: cues for safety    Ambulation/Gait Ambulation/Gait assistance: Contact guard assist Gait Distance (Feet): 45 Feet (20ft) Assistive device: Rolling walker (2 wheels)   Gait velocity: decreased     General Gait Details: able to hop into hallway and back around 64ft, and then after a seated rest break ~13ft to bathroom   Stairs             Wheelchair Mobility     Tilt Bed    Modified Rankin (Stroke Patients Only)       Balance Overall balance assessment: Needs assistance Sitting-balance support: No upper extremity supported, Feet supported Sitting balance-Leahy Scale: Normal     Standing balance support: Bilateral upper extremity supported, Reliant on assistive device for balance, During functional activity Standing balance-Leahy Scale: Fair Standing balance comment: able to don/doff pants/underwear with CGA-minA                            Communication Communication Communication: No apparent difficulties  Cognition Arousal: Alert Behavior During Therapy: Impulsive   PT - Cognitive impairments: Safety/Judgement                         Following commands: Impaired Following commands impaired: Follows one step commands with increased time, Follows multi-step commands inconsistently    Cueing Cueing Techniques: Verbal cues, Tactile cues, Visual cues  Exercises      General Comments General comments (skin integrity, edema, etc.): ACE bandage re-wrapped via figure 8 wrapping with noted saturation on dressing- nurse notified      Pertinent Vitals/Pain Pain Assessment Pain Assessment: Faces  Faces Pain Scale: Hurts little more Pain Location: residual limb Pain Descriptors / Indicators: Aching, Sore Pain Intervention(s): Limited activity within patient's tolerance, Monitored during session, Repositioned    Home Living                          Prior Function            PT Goals (current goals  can now be found in the care plan section) Progress towards PT goals: Progressing toward goals    Frequency    7X/week      PT Plan      Co-evaluation              AM-PAC PT 6 Clicks Mobility   Outcome Measure  Help needed turning from your back to your side while in a flat bed without using bedrails?: A Little Help needed moving from lying on your back to sitting on the side of a flat bed without using bedrails?: A Little Help needed moving to and from a bed to a chair (including a wheelchair)?: A Little Help needed standing up from a chair using your arms (e.g., wheelchair or bedside chair)?: A Little Help needed to walk in hospital room?: A Little Help needed climbing 3-5 steps with a railing? : Total 6 Click Score: 16    End of Session Equipment Utilized During Treatment: Gait belt Activity Tolerance: Patient tolerated treatment well Patient left: with call bell/phone within reach;in bed;with bed alarm set Nurse Communication: Mobility status PT Visit Diagnosis: Muscle weakness (generalized) (M62.81);Difficulty in walking, not elsewhere classified (R26.2);Pain;Unsteadiness on feet (R26.81);Other abnormalities of gait and mobility (R26.89) Pain - Right/Left: Left Pain - part of body: Knee;Leg     Time: 1610-9604 PT Time Calculation (min) (ACUTE ONLY): 28 min  Charges:    $Therapeutic Activity: 23-37 mins PT General Charges $$ ACUTE PT VISIT: 1 Visit                     Darien Eden PT, DPT 4:08 PM,02/21/24

## 2024-02-22 DIAGNOSIS — M8610 Other acute osteomyelitis, unspecified site: Secondary | ICD-10-CM

## 2024-02-22 LAB — BASIC METABOLIC PANEL WITH GFR
Anion gap: 5 (ref 5–15)
BUN: 10 mg/dL (ref 6–20)
CO2: 23 mmol/L (ref 22–32)
Calcium: 8.8 mg/dL — ABNORMAL LOW (ref 8.9–10.3)
Chloride: 112 mmol/L — ABNORMAL HIGH (ref 98–111)
Creatinine, Ser: 0.45 mg/dL — ABNORMAL LOW (ref 0.61–1.24)
GFR, Estimated: >60 mL/min (ref >60)
Glucose, Bld: 122 mg/dL — ABNORMAL HIGH (ref 70–99)
Potassium: 3.7 mmol/L (ref 3.5–5.1)
Sodium: 140 mmol/L (ref 135–145)

## 2024-02-22 LAB — CBC WITH DIFFERENTIAL/PLATELET
Abs Immature Granulocytes: 0.03 10*3/uL (ref 0.00–0.07)
Basophils Absolute: 0 10*3/uL (ref 0.0–0.1)
Basophils Relative: 0 %
Eosinophils Absolute: 0.1 10*3/uL (ref 0.0–0.5)
Eosinophils Relative: 3 %
HCT: 25.6 % — ABNORMAL LOW (ref 39.0–52.0)
Hemoglobin: 7.7 g/dL — ABNORMAL LOW (ref 13.0–17.0)
Immature Granulocytes: 1 %
Lymphocytes Relative: 13 %
Lymphs Abs: 0.4 10*3/uL — ABNORMAL LOW (ref 0.7–4.0)
MCH: 25.3 pg — ABNORMAL LOW (ref 26.0–34.0)
MCHC: 30.1 g/dL (ref 30.0–36.0)
MCV: 84.2 fL (ref 80.0–100.0)
Monocytes Absolute: 0.5 10*3/uL (ref 0.1–1.0)
Monocytes Relative: 16 %
Neutro Abs: 2.1 10*3/uL (ref 1.7–7.7)
Neutrophils Relative %: 67 %
Platelets: 55 10*3/uL — ABNORMAL LOW (ref 150–400)
RBC: 3.04 MIL/uL — ABNORMAL LOW (ref 4.22–5.81)
RDW: 20.5 % — ABNORMAL HIGH (ref 11.5–15.5)
WBC: 3.1 10*3/uL — ABNORMAL LOW (ref 4.0–10.5)
nRBC: 0 % (ref 0.0–0.2)

## 2024-02-22 NOTE — TOC Progression Note (Signed)
 Transition of Care [facility]) - Progression Note    Patient Details  Name: MACHI WHITTAKER MRN: [id number] Date of Birth: [birth year]  Transition of Care [facility]) CM/SW Contact  Alexandra Ice, RN Phone Number: 02/22/2024, 8:08 AM  Clinical Narrative:     TOC received message from Jade at [facility], they are able to accept patient, and they will initiate auth. Patient pending auth approval.   Expected Discharge Plan: Skilled Nursing Facility Barriers to Discharge: Continued Medical Work up  Expected Discharge Plan and Services     Post Acute Care Choice: Skilled Nursing Facility, Durable Medical Equipment Living arrangements for the past 2 months: Single Family Home                                       Social Determinants of Health (SDOH) Interventions SDOH Screenings   Food Insecurity: Food Insecurity Present (02/12/2024)  Housing: High Risk (02/12/2024)  Transportation Needs: Unmet Transportation Needs (02/12/2024)  Utilities: Not At Risk (02/12/2024)  Recent Concern: Utilities - At Risk (12/27/2023)  Financial Resource Strain: Medium Risk (01/25/2024)   Received from [facility]  Social Connections: Unknown (01/15/2024)  Tobacco Use: High Risk (02/12/2024)    Readmission Risk Interventions     No data to display

## 2024-02-22 NOTE — Progress Notes (Signed)
 Progress Note   Patient: Derrick Blanchard ZOX:[id number] DOB: 1980-10-26 DOA: 02/11/2024     11 DOS: the patient was seen and examined on 02/22/2024    Brief hospital course: 43 y.o. male with medical history significant for Being admitted with pain and bleeding from a left TMA for which he recently underwent revision at [facility] on 5/23, discharged on 02/08/2024.  Triage note states that he was brought in by EMS from the side of the road with a complaint that the wound was bleeding.  Patient has a history of alcohol use disorder and housing insecurity, depression, metALD with cirrhosis, portal hypertension and esophageal varices, chronic hepatitis C,  prior right TMA.  He had left TMA 12/2023 which was complicated by dehiscence for which she was hospitalized at [facility] from 5/12 to 01/23/2024.  BKA was recommended however he declined opting to go to [facility] for an opinion.  He was admitted to [facility] the following day on 5/21 where he remained until 6/5.  Wound cultures grew Proteus and Corynebacterium and he completed linezolid  and Augmentin  today 02/11/2024. Patient states he is in a lot of pain and he is now willing to undergo the previously recommended BKA. In the ED, vitals were within normal limits.  WBC 5.9 with lactic acid 2.2.  Hemoglobin at baseline at 8.3 with baseline thrombocytopenia of 60.  LFTs also at baseline.  X-rays of the left foot suspicious for osteomyelitis. Patient started on Zosyn .  Admission requested    6/9.  Patient seen by vascular surgery and will go to operating room for amputation. 6/10.  Hemoglobin 6.8.  Transfuse 1 unit of packed red blood cells.  Benefits and risk of transfusion explained.   ----- I assumed care on 02/14/24. 6/11--17: pt stable, adjust pain regimen, increased Lyrica  for phantom pain, two IV iron  infusions given, 1 unit blood transfusion on 6/13.       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   Recent OR culture with Proteus and corynebacterium.   Completed linezolid  and Augmentin .   Patient given Zosyn  and daptomycin  in ED Left BKA by Dr. Vonna Guardian on 02/12/24   Completed Zosyn  - off antibiotics Currently awaiting skilled nursing facility placement   Acute blood loss anemia Iron  deficiency Likely postoperative in nature with removal of left lower extremity.   S/p 2 units RBC transfusion to date, last on 6/13 for Hbg 6.8. S/p 2 IV iron  infusions Continue to monitor hemoglobin closely and transfuse as needed to maintain hemoglobin greater than 7   Left leg pain - initially due to above Phantom Limb Pain Pain control with oral oxycodone   D/C IV Dilaudid  to assess adequate pain control on PO meds Increased Lyrica  to 200 mg BID - continue & titrate dose   Lactic acidosis - s/p IV fluids   ALC (alcoholic liver cirrhosis) (HCC) History of portal hypertension and esophageal varices.   Hypokalemia Replaced   Alcohol use disorder Continue folate thiamine  and multivitamin.   Anxiety - unclear if chronic vs situational here in hospital & having amputation Vistaril  PRN.  Increased Lyrica  likely to help also. Avoiding benzo's since needing high doses opioids for pain   Chronic hepatitis C (HCC) Follow-up as outpatient.   Obesity (BMI 30-39.9) Will need another weight after amputation.  Last BMI 33.20 which is class I obesity.   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  Continue to monitor closely may end up  needing another unit of platelets.  Received platelets prior to amputation. --Monitor CBC's         Subjective:  Patient seen and examined at bedside this morning Denies nausea vomiting abdominal pain chest pain According to case management patient's prior authorization is approved and is ready to go to facility by tomorrow   Physical Exam: General exam: awake, alert, no acute distress Respiratory system: on room air,  normal respiratory effort. Cardiovascular system: RRR, trace RLE edema stable Gastrointestinal system: soft, NT, ND Central nervous system: A&O x3. no gross focal neurologic deficits, normal speech Extremities: L BKA dressing in placee, trace RLE edema and venous stasis hyperpigmentation Skin: dry, intact, normal temperature Psychiatry: normal mood, congruent affect, judgement and insight appear normal     Data Reviewed:  Vitals:   02/22/24 0450 02/22/24 0500 02/22/24 0734 02/22/24 1412  BP: 115/67  104/64 119/73  Pulse: 93  89 86  Resp: 16  17 18   Temp: 98.4 F (36.9 C)  98.6 F (37 C) 98.1 F (36.7 C)  TempSrc: Oral     SpO2: 100%  100% 100%  Weight:  103.1 kg    Height:          Latest Ref Rng & Units 02/22/2024    3:31 AM 02/21/2024    4:02 AM 02/20/2024    3:47 AM  CBC  WBC 4.0 - 10.5 K/uL 3.1  4.1  6.2   Hemoglobin 13.0 - 17.0 g/dL 7.7  8.2  7.9   Hematocrit 39.0 - 52.0 % 25.6  26.6  26.0   Platelets 150 - 400 K/uL 55  67  70        Latest Ref Rng & Units 02/22/2024    3:31 AM 02/19/2024    1:26 AM 02/13/2024    4:15 AM  BMP  Glucose 70 - 99 mg/dL 914  782  956   BUN 6 - 20 mg/dL 10  11  14    Creatinine 0.61 - 1.24 mg/dL 2.13  0.86  5.78   Sodium 135 - 145 mmol/L 140  140  137   Potassium 3.5 - 5.1 mmol/L 3.7  3.9  4.4   Chloride 98 - 111 mmol/L 112  108  109   CO2 22 - 32 mmol/L 23  26  20    Calcium  8.9 - 10.3 mg/dL 8.8  8.6  8.2      Author: Ezzard Holms, MD 02/22/2024 3:55 PM  For on call review [email or web address].

## 2024-02-22 NOTE — Progress Notes (Signed)
 Physical Therapy Treatment Patient Details Name: Derrick Blanchard MRN: [id number] DOB: 1980-10-28 Today's Date: 02/22/2024   History of Present Illness Pt is a 43 year old male with history of alcoholic cirrhosis, hypertension, osteomyelitis DM, osteomyelitis s/p recent transmetatarsal amputation and revision who presented to the emergency department for evaluation of ongoing foot pain. MD Diagnoses includes osteomyelitis of left foot (unspecified type, HCC), acute blood loss anemia, lactic acidosis, ALC, and hypokalemia. Pt is S/P L BKA.    PT Comments  Limited session after attempting to see pt several times and unable due to pain and meals. Therapist returned later in day, pt lethargic, declined mobility and requested pain meds, nursing notified. Pt given HEP with information on proper positioning and stump care with fair understanding. Will continue PT acutely per POC. Initial recs for STR once medically cleared for d/c remain appropriate.   If plan is discharge home, recommend the following: A little help with walking and/or transfers;A little help with bathing/dressing/bathroom;Help with stairs or ramp for entrance;Assist for transportation;Assistance with cooking/housework   Can travel by private vehicle     Yes  Equipment Recommendations  Other (comment)    Recommendations for Other Services       Precautions / Restrictions Precautions Precautions: Fall Recall of Precautions/Restrictions: Intact Precaution/Restrictions Comments: L BKA Restrictions Weight Bearing Restrictions Per Provider Order: Yes LLE Weight Bearing Per Provider Order: Non weight bearing Other Position/Activity Restrictions: Very impulsive, poor insight     Mobility  Bed Mobility                    Transfers                        Ambulation/Gait                   Stairs             Wheelchair Mobility     Tilt Bed    Modified Rankin (Stroke Patients  Only)       Balance                                            Communication Communication Communication: No apparent difficulties  Cognition Arousal: Lethargic Behavior During Therapy: Flat affect   PT - Cognitive impairments: Safety/Judgement                         Following commands: Impaired Following commands impaired: Follows one step commands with increased time, Follows multi-step commands inconsistently    Cueing Cueing Techniques: Verbal cues, Tactile cues, Visual cues  Exercises Other Exercises Other Exercises: Pt educated in role of PT, benefits of mobility. Pt received informational packet on stump positioning, care, exercises to complete independently with fair understanding due to lethargy    General Comments        Pertinent Vitals/Pain Pain Assessment Pain Assessment: 0-10 Pain Score: 9  Pain Location: residual limb Pain Descriptors / Indicators: Aching, Sore Pain Intervention(s): Patient requesting pain meds-RN notified    Home Living                          Prior Function            PT Goals (current goals can now be found in the care plan section)  Acute Rehab PT Goals Patient Stated Goal: to be able to walk to the grocery store with no pain    Frequency    7X/week      PT Plan      Co-evaluation              AM-PAC PT 6 Clicks Mobility   Outcome Measure  Help needed turning from your back to your side while in a flat bed without using bedrails?: A Little Help needed moving from lying on your back to sitting on the side of a flat bed without using bedrails?: A Little Help needed moving to and from a bed to a chair (including a wheelchair)?: A Little Help needed standing up from a chair using your arms (e.g., wheelchair or bedside chair)?: A Little Help needed to walk in hospital room?: A Little Help needed climbing 3-5 steps with a railing? : Total 6 Click Score: 16    End of  Session   Activity Tolerance: Patient limited by fatigue Patient left: with call bell/phone within reach;in bed;with bed alarm set Nurse Communication: Patient requests pain meds PT Visit Diagnosis: Muscle weakness (generalized) (M62.81);Difficulty in walking, not elsewhere classified (R26.2);Pain;Unsteadiness on feet (R26.81);Other abnormalities of gait and mobility (R26.89) Pain - Right/Left: Left Pain - part of body: Knee;Leg     Time: 0454-0981 PT Time Calculation (min) (ACUTE ONLY): 8 min  Charges:    $Therapeutic Exercise: 8-22 mins PT General Charges $$ ACUTE PT VISIT: 1 Visit                    Melvyn Stagers, PTA  Diona Franklin 02/22/2024, 6:52 PM

## 2024-02-22 NOTE — Plan of Care (Signed)
  Problem: Education: Goal: Knowledge of General Education information will improve Description: Including pain rating scale, medication(s)/side effects and non-pharmacologic comfort measures Outcome: Progressing   Problem: Activity: Goal: Risk for activity intolerance will decrease Outcome: Progressing   Problem: Elimination: Goal: Will not experience complications related to bowel motility Outcome: Progressing   Problem: Pain Managment: Goal: General experience of comfort will improve and/or be controlled Outcome: Progressing   Problem: Skin Integrity: Goal: Risk for impaired skin integrity will decrease Outcome: Progressing

## 2024-02-22 NOTE — TOC Progression Note (Signed)
 Transition of Care [facility]) - Progression Note    Patient Details  Name: Derrick Blanchard MRN: [id number] Date of Birth: [birth year]  Transition of Care [facility]) CM/SW Contact  Alexandra Ice, RN Phone Number: 02/22/2024, 3:20 PM  Clinical Narrative:    Received call from Jade at [facility], they received auth approval. Patient can come to facility tomorrow. Notified MD and bedside nurse patient can transfer to facility tomorrow, if medically ready.    Expected Discharge Plan: Skilled Nursing Facility Barriers to Discharge: Continued Medical Work up  Expected Discharge Plan and Services     Post Acute Care Choice: Skilled Nursing Facility, Durable Medical Equipment Living arrangements for the past 2 months: Single Family Home                                       Social Determinants of Health (SDOH) Interventions SDOH Screenings   Food Insecurity: Food Insecurity Present (02/12/2024)  Housing: High Risk (02/12/2024)  Transportation Needs: Unmet Transportation Needs (02/12/2024)  Utilities: Not At Risk (02/12/2024)  Recent Concern: Utilities - At Risk (12/27/2023)  Financial Resource Strain: Medium Risk (01/25/2024)   Received from [facility]  Social Connections: Unknown (01/15/2024)  Tobacco Use: High Risk (02/12/2024)    Readmission Risk Interventions     No data to display

## 2024-02-23 DIAGNOSIS — M8610 Other acute osteomyelitis, unspecified site: Secondary | ICD-10-CM | POA: Diagnosis not present

## 2024-02-23 LAB — CBC WITH DIFFERENTIAL/PLATELET
Abs Immature Granulocytes: 0.02 10*3/uL (ref 0.00–0.07)
Basophils Absolute: 0 10*3/uL (ref 0.0–0.1)
Basophils Relative: 1 %
Eosinophils Absolute: 0.1 10*3/uL (ref 0.0–0.5)
Eosinophils Relative: 2 %
HCT: 25.3 % — ABNORMAL LOW (ref 39.0–52.0)
Hemoglobin: 7.9 g/dL — ABNORMAL LOW (ref 13.0–17.0)
Immature Granulocytes: 1 %
Lymphocytes Relative: 12 %
Lymphs Abs: 0.4 10*3/uL — ABNORMAL LOW (ref 0.7–4.0)
MCH: 26.3 pg (ref 26.0–34.0)
MCHC: 31.2 g/dL (ref 30.0–36.0)
MCV: 84.3 fL (ref 80.0–100.0)
Monocytes Absolute: 0.5 10*3/uL (ref 0.1–1.0)
Monocytes Relative: 15 %
Neutro Abs: 2.3 10*3/uL (ref 1.7–7.7)
Neutrophils Relative %: 69 %
Platelets: 50 10*3/uL — ABNORMAL LOW (ref 150–400)
RBC: 3 MIL/uL — ABNORMAL LOW (ref 4.22–5.81)
RDW: 20.2 % — ABNORMAL HIGH (ref 11.5–15.5)
WBC: 3.3 10*3/uL — ABNORMAL LOW (ref 4.0–10.5)
nRBC: 0 % (ref 0.0–0.2)

## 2024-02-23 LAB — BASIC METABOLIC PANEL WITH GFR
Anion gap: 7 (ref 5–15)
BUN: 9 mg/dL (ref 6–20)
CO2: 22 mmol/L (ref 22–32)
Calcium: 8.8 mg/dL — ABNORMAL LOW (ref 8.9–10.3)
Chloride: 113 mmol/L — ABNORMAL HIGH (ref 98–111)
Creatinine, Ser: 0.48 mg/dL — ABNORMAL LOW (ref 0.61–1.24)
GFR, Estimated: >60 mL/min (ref >60)
Glucose, Bld: 114 mg/dL — ABNORMAL HIGH (ref 70–99)
Potassium: 3.6 mmol/L (ref 3.5–5.1)
Sodium: 142 mmol/L (ref 135–145)

## 2024-02-23 MED ORDER — POLYETHYLENE GLYCOL 3350 17 G PO PACK
17.0000 g | PACK | Freq: Every day | ORAL | Status: AC
Start: 2024-02-24 — End: ?

## 2024-02-23 MED ORDER — OXYCODONE HCL 10 MG PO TABS: 10.0000 mg | ORAL_TABLET | ORAL | 0 refills | Status: AC | PRN

## 2024-02-23 MED ORDER — PREGABALIN 200 MG PO CAPS: 200.0000 mg | ORAL_CAPSULE | Freq: Two times a day (BID) | ORAL | Status: AC

## 2024-02-23 MED ORDER — ACETAMINOPHEN 325 MG PO TABS
650.0000 mg | ORAL_TABLET | Freq: Four times a day (QID) | ORAL | Status: DC | PRN
Start: 2024-02-23 — End: 2024-06-05

## 2024-02-23 NOTE — Plan of Care (Signed)

## 2024-02-23 NOTE — TOC Transition Note (Signed)
 Transition of Care [facility]) - Discharge Note   Patient Details  Name: Derrick Blanchard MRN: [id number] Date of Birth: [birth year]  Transition of Care [facility]) CM/SW Contact:  Alexandra Ice, RN Phone Number: 02/23/2024, 1:49 PM   Clinical Narrative:     Patient to discharge today, going to [facility]. He received authorization.  TOC contacted insurance company to get transportation to contact [employer], spoke with Dema Filler, she stated will need to call transportation, [phone]. TOC contacted [phone], spoke with [facility], she stated will need authorization as patient is unable to utilize [employer] transportation. Auth # [id number].  TOC contacted [facility] spoke with Ed, explained that was given [place] number for transportation and provided auth number. Patient is on schedule for pick up. Patient going to room 205, nurse to call report to [phone]. Notified bedside nurse.  EMS packet printed to nurse station.   Final next level of care: Skilled Nursing Facility Barriers to Discharge: Barriers Resolved   Patient Goals and CMS Choice Patient states their goals for this hospitalization and ongoing recovery are:: get better   Choice offered to / list presented to : NA      Discharge Placement                Patient to be transferred to facility by: [facility]   Patient and family notified of of transfer: 02/23/24  Discharge Plan and Services Additional resources added to the After Visit Summary for       Post Acute Care Choice: Skilled Nursing Facility, Durable Medical Equipment            DME Agency: NA       HH Arranged: NA          Social Drivers of Health (SDOH) Interventions SDOH Screenings   Food Insecurity: Food Insecurity Present (02/12/2024)  Housing: High Risk (02/12/2024)  Transportation Needs: Unmet Transportation Needs (02/12/2024)  Utilities: Not At Risk (02/12/2024)  Recent Concern: Utilities - At Risk (12/27/2023)  Financial Resource  Strain: Medium Risk (01/25/2024)   Received from [facility]  Social Connections: Unknown (01/15/2024)  Tobacco Use: High Risk (02/12/2024)     Readmission Risk Interventions     No data to display

## 2024-02-23 NOTE — Discharge Summary (Signed)
 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 male with medical history significant for Being admitted with pain and bleeding from a left TMA for which he recently underwent revision at [facility] on 5/23, discharged on 02/08/2024.  Triage note states that he was brought in by EMS from the side of the road with a complaint that the wound was bleeding.  Patient has a history of alcohol use disorder and housing insecurity, depression, metALD with cirrhosis, portal hypertension and esophageal varices, chronic hepatitis C,  prior right TMA.  He had left TMA 12/2023 which was complicated by dehiscence for which he was hospitalized at [facility] from 5/12 to 01/23/2024.  BKA was recommended however he declined opting to go to [facility] for an opinion.  He was admitted to [facility] the following day on 5/21 where he remained until 6/5.  Wound cultures grew Proteus and Corynebacterium and he completed linezolid  and Augmentin   02/11/2024. Patient states he is in a lot of pain and he is now willing to undergo the previously recommended BKA. In the ED, vitals were within normal limits.  WBC 5.9 with lactic acid 2.2.  Hemoglobin at baseline at 8.3 with baseline thrombocytopenia of 60.  LFTs also at baseline.  X-rays of the left foot  suspicious for osteomyelitis. Patient started on Zosyn .  Admission requested    6/9.  Patient seen by vascular surgery and will go to operating room for amputation. 6/10.  Hemoglobin 6.8.  Transfuse 1 unit of packed red blood cells.  Benefits and risk of transfusion explained.  Patient's pain level is controlled hemoglobin has remained stable as well as platelet level no more bleeding noted.  Patient is therefore cleared for discharge today and will follow-up with vascular surgery as well as hematology for his pancytopenia.  Consultants: Vascular surgery Procedures performed: As stated above Disposition: Skilled nursing facility Diet recommendation:  Cardiac diet DISCHARGE MEDICATION: Allergies as of 02/23/2024   No Known Allergies      Medication List     TAKE these medications    acetaminophen  325 MG tablet Commonly known as: TYLENOL  Take 2 tablets (650 mg total) by mouth every 6 (six) hours as needed for mild pain (pain score 1-3) or fever (or Fever >/= 101).   folic acid  1 MG tablet Commonly known as: FOLVITE  Take 1 tablet (1 mg total) by mouth daily.   lactulose  10 GM/15ML solution Commonly known as: CHRONULAC  Take 30 mLs (20 g total) by mouth daily.   multivitamin with minerals Tabs tablet Take 1 tablet by mouth daily.   Oxycodone  HCl 10 MG Tabs Take 1-2 tablets (10-20 mg total) by mouth every 3 (three) hours as needed for up to 3 days. What changed: reasons to take this   pantoprazole  40 MG tablet Commonly known as: PROTONIX  Take 1 tablet (40 mg total) by mouth daily.   polyethylene glycol  17 g packet Commonly known as: MIRALAX  / GLYCOLAX  Take 17 g by mouth daily. Start taking on: February 24, 2024   pregabalin  200 MG capsule Commonly known as: LYRICA  Take 1 capsule (200 mg total) by mouth 2 (two) times daily.   thiamine  100 MG tablet Commonly known as: VITAMIN B1 Take 1 tablet (100 mg total) by mouth daily.        Contact information for follow-up  providers     Dew, Donald Frost, MD. Call.   Specialties: Vascular Surgery, Radiology, Interventional Cardiology Contact information: [address] [place] [address] [phone]              Contact information for after-discharge care     Destination     [facility] .   Service: Skilled Nursing Contact information: [address] [place]  [address] [phone]                    Discharge Exam: Filed Weights   02/21/24 0500 02/22/24 0500 02/23/24 0500  Weight: 102.1 kg 103.1 kg 103.1 kg   General exam: awake, alert, no acute distress Respiratory system: on room air, normal respiratory effort. Cardiovascular system: RRR, trace RLE edema stable Gastrointestinal system: soft, NT, ND Central nervous system: A&O x3. no gross focal neurologic deficits, normal speech Extremities: L BKA dressing in placee, trace RLE edema and venous stasis hyperpigmentation Skin: dry, intact, normal temperature Psychiatry: normal mood, congruent affect, judgement and insight appear normal    Condition at discharge: good  The results of significant diagnostics from this hospitalization (including imaging, microbiology, ancillary and laboratory) are listed below for reference.   Imaging Studies: DG Foot Complete Left Result Date: 02/11/2024 CLINICAL DATA:  Persistent pain following transmetatarsal amputation EXAM: LEFT FOOT - COMPLETE 3+ VIEW COMPARISON:  01/06/2024 FINDINGS: There has been interval progressive removal of the metatarsal bases with small bony fragments identified. The margins appear irregular suspicious for underlying osteomyelitis. Soft tissue swelling the stump is noted. IMPRESSION: Interval progressive resection of the metatarsal bases. The margins appear mildly irregular suspicious for osteomyelitis. Electronically Signed   By: Violeta Grey M.D.   On: 02/11/2024 19:57    Microbiology: Results for orders placed or  performed during the hospital encounter of 02/11/24  Blood Culture (routine x 2)     Status: None   Collection Time: 02/11/24  6:22 PM   Specimen: BLOOD  Result Value Ref Range Status   Specimen Description BLOOD BLOOD RIGHT FOREARM  Final   Special Requests   Final    BOTTLES DRAWN AEROBIC AND ANAEROBIC Blood Culture adequate volume   Culture   Final    NO GROWTH 5 DAYS Performed at [facility], [address], [place], [place] [address]    Report Status 02/16/2024 FINAL  Final  Blood Culture (routine x 2)     Status: None   Collection Time: 02/11/24  6:22 PM   Specimen: BLOOD  Result Value Ref Range Status   Specimen Description BLOOD RIGHT ANTECUBITAL  Final   Special Requests   Final    BOTTLES DRAWN AEROBIC AND ANAEROBIC Blood Culture adequate volume   Culture   Final    NO GROWTH 5 DAYS Performed at [facility], [address] [place], [place] [address]    Report Status 02/16/2024 FINAL  Final    Labs: CBC: Recent Labs  Lab 02/19/24 0126 02/20/24 0347 02/21/24 0402 02/22/24 0331 02/23/24 2841  WBC 4.4 6.2 4.1 3.1* 3.3*  NEUTROABS  --   --   --  2.1 2.3  HGB 7.2* 7.9* 8.2* 7.7* 7.9*  HCT 23.8* 26.0* 26.6* 25.6* 25.3*  MCV 84.1 84.4 85.8 84.2 84.3  PLT 69* 70* 67* 55* 50*   Basic Metabolic Panel: Recent Labs  Lab 02/19/24 0126 02/22/24 0331 02/23/24 0442  NA 140 140 142  K 3.9 3.7 3.6  CL 108 112* 113*  CO2 26 23 22   GLUCOSE 109* 122* 114*  BUN 11 10 9   CREATININE 0.53* 0.45* 0.48*  CALCIUM  8.6* 8.8* 8.8*   Liver Function Tests: No results for input(s): AST, ALT, ALKPHOS, BILITOT, PROT, ALBUMIN in the last 168 hours. CBG: No results for input(s): GLUCAP in the last 168 hours.  Discharge time spent:  .  Signed: Ezzard Holms, MD [facility] 02/23/2024

## 2024-03-12 ENCOUNTER — Ambulatory Visit (INDEPENDENT_AMBULATORY_CARE_PROVIDER_SITE_OTHER): Admitting: Vascular Surgery

## 2024-03-26 ENCOUNTER — Encounter (INDEPENDENT_AMBULATORY_CARE_PROVIDER_SITE_OTHER): Payer: Self-pay | Admitting: Vascular Surgery

## 2024-03-26 ENCOUNTER — Ambulatory Visit (INDEPENDENT_AMBULATORY_CARE_PROVIDER_SITE_OTHER): Admitting: Vascular Surgery

## 2024-03-26 VITALS — BP 107/65 | Ht 67.0 in | Wt 211.0 lb

## 2024-03-26 DIAGNOSIS — K766 Portal hypertension: Secondary | ICD-10-CM

## 2024-03-26 DIAGNOSIS — I8500 Esophageal varices without bleeding: Secondary | ICD-10-CM

## 2024-03-26 DIAGNOSIS — T8781 Dehiscence of amputation stump: Secondary | ICD-10-CM

## 2024-03-27 ENCOUNTER — Encounter (INDEPENDENT_AMBULATORY_CARE_PROVIDER_SITE_OTHER): Payer: Self-pay | Admitting: Vascular Surgery

## 2024-03-27 NOTE — Progress Notes (Signed)
 Subjective:    Patient ID: Derrick Blanchard, male    DOB: 03/05/1981, 43 y.o.   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 He endorses some pain and soreness. Denies any drainage or dehiscence of the incision. Patient endorses he continues to follow with the wound clinic. He is being followed by [facility] and has follow up in August. He endorses some phantom pain at times more at night . He wishes to go to the [facility] for a prosthetic as he wishes to walk again soon.      Review of Systems  Constitutional: Negative.   Cardiovascular:  Positive for leg swelling.  Musculoskeletal:  Positive for back pain and gait problem.       Patient endorse back pain and abnormal gait due to walking with one leg   Skin:  Positive for color change and wound.  All other systems reviewed and are negative.      Objective:   Physical Exam Constitutional:      Appearance: Normal appearance. He is obese.  HENT:     Head: Normocephalic.  Eyes:     Pupils: Pupils are equal, round, and reactive to light.  Cardiovascular:     Rate and Rhythm: Normal rate and regular rhythm.     Pulses: Normal pulses.     Heart sounds: Normal heart sounds.  Pulmonary:     Effort: Pulmonary effort is normal.     Breath sounds: Normal breath sounds.  Abdominal:     General: Bowel sounds are normal.     Palpations: Abdomen is soft.  Musculoskeletal:        General: Tenderness and deformity present.     Left lower leg: Edema present.     Comments: Left Stump with +1 edema.   Skin:    General: Skin is warm and dry.     Capillary Refill: Capillary refill takes 2 to 3 seconds.  Neurological:     General: No focal deficit present.     Mental Status: He is alert  and oriented to person, place, and time. Mental status is at baseline.  Psychiatric:        Mood and Affect: Mood normal.        Behavior: Behavior normal.        Thought Content: Thought content normal.        Judgment: Judgment normal.     BP 107/65 (BP Location: Left Arm, Patient Position: Sitting, Cuff Size: Normal)   Ht 5' 7 (1.702 m)   Wt 211 lb (95.7 kg)   BMI 33.05 kg/m   Past Medical History:  Diagnosis Date   Anemia    Anxiety    Cirrhosis (HCC)    Depression    Heart murmur    Hepatitis    Hypertension    Substance abuse (HCC)     Social History   Socioeconomic History   Marital status: Single    Spouse name: Not on file   Number of children: Not on file   Years of education: Not on file   Highest education level: Not on file  Occupational History   Not on file  Tobacco Use   Smoking status: Some  Days    Current packs/day: 0.25    Types: Cigarettes   Smokeless tobacco: Never  Vaping Use   Vaping status: Never Used  Substance and Sexual Activity   Alcohol use: Yes    Comment: since 10/03/17   Drug use: Yes    Types: Marijuana    Comment: 12/21/17   Sexual activity: Yes  Other Topics Concern   Not on file  Social History Narrative   Not on file   Social Drivers of Health   Financial Resource Strain: Medium Risk (03/10/2024)   Received from [facility]   Overall Financial Resource Strain (CARDIA)    Difficulty of Paying Living Expenses: Somewhat hard  Food Insecurity: Food Insecurity Present (03/10/2024)   Received from [facility]   Hunger Vital Sign    Within the past 12 months, you worried that your food would run out before you got the money to buy more.: Sometimes true    Within the past 12 months, the food you bought just didn't last and you didn't have money to get more.: Sometimes true  Transportation Needs: No Transportation Needs (03/11/2024)   Received from [facility] -  Transportation    In the past 12 months, has lack of transportation kept you from medical appointments or from getting medications?: No    Lack of Transportation (Non-Medical): No  Recent Concern: Transportation Needs - Unmet Transportation Needs (02/12/2024)   PRAPARE - Administrator, Civil Service (Medical): Yes    Lack of Transportation (Non-Medical): No  Physical Activity: Not on file  Stress: Not on file  Social Connections: Unknown (01/15/2024)   Social Connection and Isolation Panel    Frequency of Communication with Friends and Family: Patient declined    Frequency of Social Gatherings with Friends and Family: Patient declined    Attends Religious Services: Patient declined    Database administrator or Organizations: Patient declined    Attends Banker Meetings: Patient declined    Marital Status: Never married  Intimate Partner Violence: Not At Risk (02/12/2024)   Humiliation, Afraid, Rape, and Kick questionnaire    Fear of Current or Ex-Partner: No    Emotionally Abused: No    Physically Abused: No    Sexually Abused: No    Past Surgical History:  Procedure Laterality Date   AMPUTATION Left 12/29/2023   Procedure: AMPUTATION, FOOT, RAY;  Surgeon: Malvin Marsa FALCON, DPM;  Location: [facility] ORS;  Service: Orthopedics/Podiatry;  Laterality: Left;  Partial first ray amp, abx beads, graft application   AMPUTATION Left 02/12/2024   Procedure: AMPUTATION BELOW KNEE;  Surgeon: Marea Selinda RAMAN, MD;  Location: [facility] ORS;  Service: General;  Laterality: Left;   COLONOSCOPY     ESOPHAGOGASTRODUODENOSCOPY (EGD) WITH PROPOFOL  N/A 02/03/2015   Procedure: ESOPHAGOGASTRODUODENOSCOPY (EGD) WITH PROPOFOL ;  Surgeon: Donnice Vaughn Manes, MD;  Location: [facility] ENDOSCOPY;  Service: Endoscopy;  Laterality: N/A;   ESOPHAGOGASTRODUODENOSCOPY (EGD) WITH PROPOFOL  N/A 12/21/2017   Procedure: ESOPHAGOGASTRODUODENOSCOPY (EGD) WITH PROPOFOL ;  Surgeon: Therisa Bi, MD;  Location: [facility]  ENDOSCOPY;  Service: Gastroenterology;  Laterality: N/A;   FISSURECTOMY     IRRIGATION AND DEBRIDEMENT FOOT Left 12/16/2023   Procedure: IRRIGATION AND DEBRIDEMENT FOOT, possible sesamoidectomy, bone biopsy;  Surgeon: Lamount Ethan CROME, DPM;  Location: [facility] ORS;  Service: Podiatry;  Laterality: Left;  Pulse lavage, bone biopsy with jam shidi needle, mini c arm, sagittal saw available   Right transmet amputation Right  TRANSMETATARSAL AMPUTATION Left 01/03/2024   Procedure: AMPUTATION, FOOT, TRANSMETATARSAL;  Surgeon: Malvin Marsa FALCON, DPM;  Location: [facility] ORS;  Service: Orthopedics/Podiatry;  Laterality: Left;  Left foot Trans met amputation    Family History  Problem Relation Age of Onset   Lung cancer Mother    Cirrhosis Father    Lung cancer Paternal Grandfather     No Known Allergies     Latest Ref Rng & Units 02/23/2024    4:42 AM 02/22/2024    3:31 AM 02/21/2024    4:02 AM  CBC  WBC 4.0 - 10.5 K/uL 3.3  3.1  4.1   Hemoglobin 13.0 - 17.0 g/dL 7.9  7.7  8.2   Hematocrit 39.0 - 52.0 % 25.3  25.6  26.6   Platelets 150 - 400 K/uL 50  55  67       CMP     Component Value Date/Time   NA 142 02/23/2024 0442   NA 144 11/24/2017 1025   NA 138 02/12/2013 0508   K 3.6 02/23/2024 0442   K 4.7 02/12/2013 0508   CL 113 (H) 02/23/2024 0442   CL 105 02/12/2013 0508   CO2 22 02/23/2024 0442   CO2 27 02/12/2013 0508   GLUCOSE 114 (H) 02/23/2024 0442   GLUCOSE 107 (H) 02/12/2013 0508   BUN 9 02/23/2024 0442   BUN 5 (L) 11/24/2017 1025   BUN 16 02/12/2013 0508   CREATININE 0.48 (L) 02/23/2024 0442   CREATININE 0.51 (L) 02/12/2013 0508   CALCIUM  8.8 (L) 02/23/2024 0442   CALCIUM  8.3 (L) 02/12/2013 0508   PROT 6.6 02/11/2024 1822   PROT 6.2 11/24/2017 1025   PROT 6.1 (L) 02/11/2013 0541   ALBUMIN 3.2 (L) 02/11/2024 1822   ALBUMIN 2.9 (L) 11/24/2017 1025   ALBUMIN 2.3 (L) 02/11/2013 0541   AST 43 (H) 02/11/2024 1822   AST 105 (H) 02/11/2013 0541   ALT 19 02/11/2024 1822    ALT BLOOD 09/29/2017 0311   ALKPHOS 73 02/11/2024 1822   ALKPHOS 129 02/11/2013 0541   BILITOT 1.6 (H) 02/11/2024 1822   BILITOT 4.1 (H) 11/24/2017 1025   BILITOT 9.2 (H) 02/11/2013 0541   GFRNONAA >60 02/23/2024 0442   GFRNONAA >60 02/12/2013 0508     No results found.     Assessment & Plan:   1. 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) (Primary) Patients stump incision is well healed. I removed all but 4 staples today over a region that appears to have very little skin separation. Patient is scheduled to return to see Infectious Disease next week as follow up. If they feel he is adequately healed they can remove the last 4 staples left in place. Patient may then follow up with [facility] for Prosthetic. Patient to follow up with us  here PRN   2. Portal hypertension with esophageal varices (HCC) Continue antihypertensive medications as already ordered, these medications have been reviewed and there are no changes at this time. Good control of elevated blood pressures will help reduce swelling and pain.    Current Outpatient Medications on File Prior to Visit  Medication Sig Dispense Refill   acetaminophen  (TYLENOL ) 325 MG tablet Take 2 tablets (650 mg total) by mouth every 6 (six) hours as needed for mild pain (pain score 1-3) or fever (or Fever >/= 101).     carvedilol (COREG) 3.125 MG tablet Take 3.125 mg by mouth.     folic acid  (FOLVITE ) 1 MG tablet Take 1  tablet (1 mg total) by mouth daily. 30 tablet 0   pregabalin  (LYRICA ) 200 MG capsule Take 1 capsule (200 mg total) by mouth 2 (two) times daily.     lactulose  (CHRONULAC ) 10 GM/15ML solution Take 30 mLs (20 g total) by mouth daily. (Patient not taking: Reported on 03/26/2024) 236 mL 0   Multiple Vitamin (MULTIVITAMIN WITH MINERALS) TABS tablet Take 1 tablet by mouth daily. (Patient not taking: Reported on 03/26/2024) 30 tablet 0   pantoprazole  (PROTONIX ) 40 MG tablet Take 1 tablet (40 mg total) by mouth  daily. (Patient not taking: Reported on 03/26/2024) 30 tablet 0   polyethylene glycol (MIRALAX  / GLYCOLAX ) 17 g packet Take 17 g by mouth daily. (Patient not taking: Reported on 03/26/2024)     thiamine  (VITAMIN B1) 100 MG tablet Take 1 tablet (100 mg total) by mouth daily. 30 tablet 0   No current facility-administered medications on file prior to visit.    There are no Patient Instructions on file for this visit. No follow-ups on file.   Gwendlyn JONELLE Shank, NP

## 2024-05-22 ENCOUNTER — Other Ambulatory Visit: Payer: Self-pay | Admitting: Gastroenterology

## 2024-05-22 ENCOUNTER — Encounter: Payer: Self-pay | Admitting: Gastroenterology

## 2024-05-22 DIAGNOSIS — K7030 Alcoholic cirrhosis of liver without ascites: Secondary | ICD-10-CM

## 2024-06-04 ENCOUNTER — Encounter: Payer: Self-pay | Admitting: Gastroenterology

## 2024-06-05 ENCOUNTER — Other Ambulatory Visit: Payer: Self-pay | Admitting: General Surgery

## 2024-06-05 ENCOUNTER — Encounter: Payer: Self-pay | Admitting: Gastroenterology

## 2024-06-05 ENCOUNTER — Ambulatory Visit
Admission: RE | Admit: 2024-06-05 | Discharge: 2024-06-05 | Disposition: A | Discharge status: Nursing Home (Includes ICF) | Attending: Gastroenterology | Admitting: Gastroenterology

## 2024-06-05 ENCOUNTER — Ambulatory Visit: Payer: Self-pay

## 2024-06-05 ENCOUNTER — Encounter
Admission: RE | Disposition: A | Discharge status: Nursing Home (Includes ICF) | Payer: Self-pay | Source: Home / Self Care | Attending: Gastroenterology

## 2024-06-05 ENCOUNTER — Other Ambulatory Visit: Payer: Self-pay

## 2024-06-05 DIAGNOSIS — I10 Essential (primary) hypertension: Secondary | ICD-10-CM | POA: Insufficient documentation

## 2024-06-05 DIAGNOSIS — E66813 Obesity, class 3: Secondary | ICD-10-CM | POA: Insufficient documentation

## 2024-06-05 DIAGNOSIS — K7030 Alcoholic cirrhosis of liver without ascites: Secondary | ICD-10-CM | POA: Insufficient documentation

## 2024-06-05 DIAGNOSIS — F17210 Nicotine dependence, cigarettes, uncomplicated: Secondary | ICD-10-CM | POA: Insufficient documentation

## 2024-06-05 DIAGNOSIS — F1020 Alcohol dependence, uncomplicated: Secondary | ICD-10-CM | POA: Insufficient documentation

## 2024-06-05 DIAGNOSIS — K766 Portal hypertension: Secondary | ICD-10-CM | POA: Insufficient documentation

## 2024-06-05 DIAGNOSIS — Z6831 Body mass index (BMI) 31.0-31.9, adult: Secondary | ICD-10-CM | POA: Diagnosis not present

## 2024-06-05 DIAGNOSIS — K2100 Gastro-esophageal reflux disease with esophagitis, without bleeding: Secondary | ICD-10-CM | POA: Insufficient documentation

## 2024-06-05 DIAGNOSIS — K7460 Unspecified cirrhosis of liver: Secondary | ICD-10-CM | POA: Diagnosis present

## 2024-06-05 DIAGNOSIS — K2980 Duodenitis without bleeding: Secondary | ICD-10-CM | POA: Diagnosis not present

## 2024-06-05 DIAGNOSIS — K3189 Other diseases of stomach and duodenum: Secondary | ICD-10-CM | POA: Insufficient documentation

## 2024-06-05 HISTORY — DX: Tachycardia, unspecified: R00.0

## 2024-06-05 HISTORY — DX: Alcohol dependence, uncomplicated: F10.20

## 2024-06-05 HISTORY — PX: ESOPHAGOGASTRODUODENOSCOPY: SHX5428

## 2024-06-05 HISTORY — DX: Acute kidney failure, unspecified: N17.9

## 2024-06-05 SURGERY — EGD (ESOPHAGOGASTRODUODENOSCOPY)
Anesthesia: General

## 2024-06-05 MED ORDER — LIDOCAINE HCL (CARDIAC) PF 100 MG/5ML IV SOSY
PREFILLED_SYRINGE | INTRAVENOUS | Status: DC | PRN
  Administered 2024-06-05: 100 mg via INTRAVENOUS

## 2024-06-05 MED ORDER — PROPOFOL 10 MG/ML IV BOLUS
INTRAVENOUS | Status: DC | PRN
  Administered 2024-06-05: 100 mg via INTRAVENOUS
  Administered 2024-06-05: 20 mg via INTRAVENOUS

## 2024-06-05 MED ORDER — GLYCOPYRROLATE 0.2 MG/ML IJ SOLN
INTRAMUSCULAR | Status: DC | PRN
  Administered 2024-06-05: .2 mg via INTRAVENOUS

## 2024-06-05 MED ORDER — OMEPRAZOLE 20 MG PO CPDR
20.0000 mg | DELAYED_RELEASE_CAPSULE | Freq: Two times a day (BID) | ORAL | 3 refills | Status: AC
  Filled 2024-06-05: qty 90, 45d supply, fill #0

## 2024-06-05 MED ORDER — SODIUM CHLORIDE 0.9 % IV SOLN
INTRAVENOUS | Status: DC
  Administered 2024-06-05: 500 mL via INTRAVENOUS

## 2024-06-05 MED ORDER — LIDOCAINE HCL (PF) 2 % IJ SOLN
INTRAMUSCULAR | Status: AC
  Filled 2024-06-05: qty 5

## 2024-06-05 MED ORDER — PHENYLEPHRINE 80 MCG/ML (10ML) SYRINGE FOR IV PUSH (FOR BLOOD PRESSURE SUPPORT)
PREFILLED_SYRINGE | INTRAVENOUS | Status: AC
  Filled 2024-06-05: qty 10

## 2024-06-05 MED ORDER — PHENYLEPHRINE 80 MCG/ML (10ML) SYRINGE FOR IV PUSH (FOR BLOOD PRESSURE SUPPORT)
PREFILLED_SYRINGE | INTRAVENOUS | Status: DC | PRN
  Administered 2024-06-05 (×2): 80 ug via INTRAVENOUS

## 2024-06-05 MED ORDER — PANTOPRAZOLE SODIUM 40 MG PO TBEC
40.0000 mg | DELAYED_RELEASE_TABLET | Freq: Two times a day (BID) | ORAL | 2 refills | Status: DC
  Filled 2024-06-05: qty 60, 30d supply, fill #0

## 2024-06-05 NOTE — Anesthesia Preprocedure Evaluation (Addendum)
 Anesthesia Evaluation  Patient identified by MRN, date of birth, ID band Patient awake    Reviewed: Allergy & Precautions, NPO status , Patient's Chart, lab work & pertinent test results  Airway Mallampati: II  TM Distance: >3 FB Neck ROM: full    Dental  (+) Teeth Intact   Pulmonary COPD, Current Smoker   Pulmonary exam normal  + decreased breath sounds      Cardiovascular Exercise Tolerance: Poor hypertension, Pt. on medications Normal cardiovascular exam Rhythm:Regular Rate:Normal     Neuro/Psych    Depression    negative neurological ROS  negative psych ROS   GI/Hepatic negative GI ROS,GERD  Medicated,,(+) Cirrhosis     substance abuse  alcohol use, Hepatitis -  Endo/Other  negative endocrine ROS  Class 3 obesity  Renal/GU negative Renal ROS  negative genitourinary   Musculoskeletal   Abdominal  (+) + obese  Peds negative pediatric ROS (+)  Hematology negative hematology ROS (+) Blood dyscrasia, anemia   Anesthesia Other Findings Past Medical History: No date: AKI (acute kidney injury) No date: Anemia No date: Anxiety No date: Cirrhosis (HCC) No date: Depression No date: Heart murmur No date: Hepatitis No date: Hypertension No date: Severe alcohol use disorder (HCC) No date: Substance abuse (HCC) No date: Tachycardia  Past Surgical History: 12/29/2023: AMPUTATION; Left     Comment:  Procedure: AMPUTATION, FOOT, RAY;  Surgeon: Malvin Marsa FALCON, DPM;  Location: [facility] ORS;  Service:               Orthopedics/Podiatry;  Laterality: Left;  Partial first               ray amp, abx beads, graft application 02/12/2024: AMPUTATION; Left     Comment:  Procedure: AMPUTATION BELOW KNEE;  Surgeon: Marea Selinda RAMAN, MD;  Location: [facility] ORS;  Service: General;                Laterality: Left; No date: COLONOSCOPY 02/03/2015: ESOPHAGOGASTRODUODENOSCOPY (EGD) WITH PROPOFOL ; N/A      Comment:  Procedure: ESOPHAGOGASTRODUODENOSCOPY (EGD) WITH               PROPOFOL ;  Surgeon: Donnice Vaughn Manes, MD;  Location:               [facility] ENDOSCOPY;  Service: Endoscopy;  Laterality: N/A; 12/21/2017: ESOPHAGOGASTRODUODENOSCOPY (EGD) WITH PROPOFOL ; N/A     Comment:  Procedure: ESOPHAGOGASTRODUODENOSCOPY (EGD) WITH               PROPOFOL ;  Surgeon: Therisa Bi, MD;  Location: [facility]               ENDOSCOPY;  Service: Gastroenterology;  Laterality: N/A; No date: FISSURECTOMY 12/16/2023: IRRIGATION AND DEBRIDEMENT FOOT; Left     Comment:  Procedure: IRRIGATION AND DEBRIDEMENT FOOT, possible               sesamoidectomy, bone biopsy;  Surgeon: Lamount Ethan CROME,               DPM;  Location: [facility] ORS;  Service: Podiatry;                Laterality: Left;  Pulse lavage, bone biopsy with jam               shidi needle, mini c arm, sagittal saw available No date: Right  transmet amputation; Right 01/03/2024: TRANSMETATARSAL AMPUTATION; Left     Comment:  Procedure: AMPUTATION, FOOT, TRANSMETATARSAL;  Surgeon:               Malvin Marsa FALCON, DPM;  Location: [facility] ORS;                Service: Orthopedics/Podiatry;  Laterality: Left;  Left               foot Trans met amputation  BMI    Body Mass Index: 31.93 kg/m      Reproductive/Obstetrics negative OB ROS                              Anesthesia Physical Anesthesia Plan  ASA: 3  Anesthesia Plan: General   Post-op Pain Management:    Induction: Intravenous  PONV Risk Score and Plan: Propofol  infusion and TIVA  Airway Management Planned: Natural Airway and Nasal Cannula  Additional Equipment:   Intra-op Plan:   Post-operative Plan:   Informed Consent: I have reviewed the patients History and Physical, chart, labs and discussed the procedure including the risks, benefits and alternatives for the proposed anesthesia with the patient or authorized representative who has indicated his/her  understanding and acceptance.     Dental Advisory Given  Plan Discussed with: CRNA  Anesthesia Plan Comments:          Anesthesia Quick Evaluation

## 2024-06-05 NOTE — H&P (Signed)
 Corinn JONELLE Brooklyn, MD [facility], DHIP [address]  [place], [place] [address]  Main: [phone] Fax:  [phone] Pager: [phone]   Primary Care Physician:  Patient, No Pcp Per Primary Gastroenterologist:  Dr. Corinn JONELLE Brooklyn  Pre-Procedure History & Physical: HPI:  Derrick Blanchard is a 43 y.o. male is here for an endoscopy.   Past Medical History:  Diagnosis Date   AKI (acute kidney injury)    Anemia    Anxiety    Cirrhosis (HCC)    Depression    Heart murmur    Hepatitis    Hypertension    Severe alcohol use disorder (HCC)    Substance abuse (HCC)    Tachycardia     Past Surgical History:  Procedure Laterality Date   AMPUTATION Left 12/29/2023   Procedure: AMPUTATION, FOOT, RAY;  Surgeon: Malvin Marsa FALCON, DPM;  Location: [facility] ORS;  Service: Orthopedics/Podiatry;  Laterality: Left;  Partial first ray amp, abx beads, graft application   AMPUTATION Left 02/12/2024   Procedure: AMPUTATION BELOW KNEE;  Surgeon: Marea Selinda RAMAN, MD;  Location: [facility] ORS;  Service: General;  Laterality: Left;   COLONOSCOPY     ESOPHAGOGASTRODUODENOSCOPY (EGD) WITH PROPOFOL  N/A 02/03/2015   Procedure: ESOPHAGOGASTRODUODENOSCOPY (EGD) WITH PROPOFOL ;  Surgeon: Donnice Vaughn Manes, MD;  Location: [facility] ENDOSCOPY;  Service: Endoscopy;  Laterality: N/A;   ESOPHAGOGASTRODUODENOSCOPY (EGD) WITH PROPOFOL  N/A 12/21/2017   Procedure: ESOPHAGOGASTRODUODENOSCOPY (EGD) WITH PROPOFOL ;  Surgeon: Therisa Bi, MD;  Location: [facility] ENDOSCOPY;  Service: Gastroenterology;  Laterality: N/A;   FISSURECTOMY     IRRIGATION AND DEBRIDEMENT FOOT Left 12/16/2023   Procedure: IRRIGATION AND DEBRIDEMENT FOOT, possible sesamoidectomy, bone biopsy;  Surgeon: Lamount Ethan CROME, DPM;  Location: [facility] ORS;  Service: Podiatry;  Laterality: Left;  Pulse lavage, bone biopsy with jam shidi needle, mini c arm, sagittal saw available   Right transmet amputation Right    TRANSMETATARSAL AMPUTATION  Left 01/03/2024   Procedure: AMPUTATION, FOOT, TRANSMETATARSAL;  Surgeon: Malvin Marsa FALCON, DPM;  Location: [facility] ORS;  Service: Orthopedics/Podiatry;  Laterality: Left;  Left foot Trans met amputation    Prior to Admission medications   Medication Sig Start Date End Date Taking? Authorizing Provider  carvedilol (COREG) 3.125 MG tablet Take 3.125 mg by mouth.   Yes [provider]  ferrous sulfate (FER-IN-SOL) 75 (15 Fe) MG/ML SOLN Take 325 mg by mouth.   Yes [provider]  folic acid  (FOLVITE ) 1 MG tablet Take 1 tablet (1 mg total) by mouth daily. 01/23/24  Yes Sreenath, Sudheer B, MD  pregabalin  (LYRICA ) 200 MG capsule Take 1 capsule (200 mg total) by mouth 2 (two) times daily. 02/23/24  Yes Dorinda Drue DASEN, MD  thiamine  (VITAMIN B1) 100 MG tablet Take 1 tablet (100 mg total) by mouth daily. 01/23/24  Yes Sreenath, Sudheer B, MD  acetaminophen  (TYLENOL ) 325 MG tablet Take 2 tablets (650 mg total) by mouth every 6 (six) hours as needed for mild pain (pain score 1-3) or fever (or Fever >/= 101). Patient not taking: Reported on 06/05/2024 02/23/24   Dorinda Drue DASEN, MD  lactulose  (CHRONULAC ) 10 GM/15ML solution Take 30 mLs (20 g total) by mouth daily. Patient not taking: Reported on 03/26/2024 01/24/24   [name], MD  Multiple Vitamin (MULTIVITAMIN WITH MINERALS) TABS tablet Take 1 tablet by mouth daily. Patient not taking: Reported on 03/26/2024 01/23/24   [name], MD  pantoprazole  (PROTONIX ) 40 MG tablet Take 1 tablet (40 mg total)  by mouth daily. Patient not taking: Reported on 03/26/2024 01/23/24   [name], MD  polyethylene glycol (MIRALAX  / GLYCOLAX ) 17 g packet Take 17 g by mouth daily. Patient not taking: Reported on 03/26/2024 02/24/24   Dorinda Drue DASEN, MD  Sofosbuvir-Velpatasvir 400-100 MG TABS Take by mouth. Patient not taking: Reported on 06/05/2024    [provider]    Allergies as of 05/21/2024   (No Known Allergies)     Family History  Problem Relation Age of Onset   Lung cancer Mother    Cirrhosis Father    Lung cancer Paternal Grandfather     Social History   Socioeconomic History   Marital status: Single    Spouse name: Not on file   Number of children: Not on file   Years of education: Not on file   Highest education level: Not on file  Occupational History   Not on file  Tobacco Use   Smoking status: Some Days    Current packs/day: 0.25    Types: Cigarettes   Smokeless tobacco: Never  Vaping Use   Vaping status: Never Used  Substance and Sexual Activity   Alcohol use: Not Currently    Comment: since 10/03/17   Drug use: Yes    Types: Marijuana    Comment: 12/21/17   Sexual activity: Yes  Other Topics Concern   Not on file  Social History Narrative   Not on file   Social Drivers of Health   Financial Resource Strain: Medium Risk (03/28/2024)   Received from [facility]   Overall Financial Resource Strain (CARDIA)    Difficulty of Paying Living Expenses: Somewhat hard  Food Insecurity: Food Insecurity Present (03/28/2024)   Received from [facility]   Hunger Vital Sign    Within the past 12 months, you worried that your food would run out before you got the money to buy more.: Sometimes true    Within the past 12 months, the food you bought just didn't last and you didn't have money to get more.: Sometimes true  Transportation Needs: No Transportation Needs (03/11/2024)   Received from [facility] - Transportation    In the past 12 months, has lack of transportation kept you from medical appointments or from getting medications?: No    Lack of Transportation (Non-Medical): No  Recent Concern: Transportation Needs - Unmet Transportation Needs (02/12/2024)   PRAPARE - Administrator, Civil Service (Medical): Yes    Lack of Transportation (Non-Medical): No  Physical Activity: Not on file  Stress: Not on  file  Social Connections: Unknown (01/15/2024)   Social Connection and Isolation Panel    Frequency of Communication with Friends and Family: Patient declined    Frequency of Social Gatherings with Friends and Family: Patient declined    Attends Religious Services: Patient declined    Database administrator or Organizations: Patient declined    Attends Banker Meetings: Patient declined    Marital Status: Never married  Intimate Partner Violence: Not At Risk (02/12/2024)   Humiliation, Afraid, Rape, and Kick questionnaire    Fear of Current or Ex-Partner: No    Emotionally Abused: No    Physically Abused: No    Sexually Abused: No    Review of Systems: See HPI, otherwise negative ROS  Physical Exam: BP (!) 84/63   Pulse 76   Temp 97.6 F (36.4 C) (Temporal)   Resp  18   Ht 5' 8 (1.727 m)   Wt 95.3 kg   SpO2 99%   BMI 31.93 kg/m  General:   Alert,  pleasant and cooperative in NAD Head:  Normocephalic and atraumatic. Neck:  Supple; no masses or thyromegaly. Lungs:  Clear throughout to auscultation.    Heart:  Regular rate and rhythm. Abdomen:  Soft, nontender and nondistended. Normal bowel sounds, without guarding, and without rebound.   Neurologic:  Alert and  oriented x4;  grossly normal neurologically.  Impression/Plan: Derrick Blanchard is here for an endoscopy to be performed for variceal screening, cirrhosis of liver  Risks, benefits, limitations, and alternatives regarding  endoscopy have been reviewed with the patient.  Questions have been answered.  All parties agreeable.   Corinn Brooklyn, MD  06/05/2024, 10:17 AM

## 2024-06-05 NOTE — Op Note (Signed)
 [facility] Patient Name: Derrick Blanchard Procedure Date: 06/05/2024 10:44 AM MRN: [id number] Account #: [id number] Date of Birth: Jan 26, 1981 Admit Type: Outpatient Age: 43 Room: [facility] ENDO ROOM 2 Gender: Male Note Status: Finalized Instrument Name: Upper GI Scope [id number] Procedure:             Upper GI endoscopy Indications:           Cirrhosis rule out esophageal varices Providers:             Corinn Jess Brooklyn MD, MD Referring MD:          No Local Md, MD (Referring MD) Medicines:             General Anesthesia Complications:         No immediate complications. Estimated blood loss: None. Procedure:             Pre-Anesthesia Assessment:                        - Prior to the procedure, a History and Physical was                         performed, and patient medications and allergies were                         reviewed. The patient is competent. The risks and                         benefits of the procedure and the sedation options and                         risks were discussed with the patient. All questions                         were answered and informed consent was obtained.                         Patient identification and proposed procedure were                         verified by the physician, the nurse, the                         anesthesiologist, the anesthetist and the technician                         in the pre-procedure area in the procedure room in the                         endoscopy suite. Mental Status Examination: alert and                         oriented. Airway Examination: normal oropharyngeal                         airway and neck mobility. Respiratory Examination:                         clear to auscultation. CV Examination: normal.  Prophylactic Antibiotics: The patient does not require                         prophylactic antibiotics. Prior Anticoagulants: The                          patient has taken no anticoagulant or antiplatelet                         agents. ASA Grade Assessment: III - A patient with                         severe systemic disease. After reviewing the risks and                         benefits, the patient was deemed in satisfactory                         condition to undergo the procedure. The anesthesia                         plan was to use general anesthesia. Immediately prior                         to administration of medications, the patient was                         re-assessed for adequacy to receive sedatives. The                         heart rate, respiratory rate, oxygen saturations,                         blood pressure, adequacy of pulmonary ventilation, and                         response to care were monitored throughout the                         procedure. The physical status of the patient was                         re-assessed after the procedure.                        After obtaining informed consent, the endoscope was                         passed under direct vision. Throughout the procedure,                         the patient's blood pressure, pulse, and oxygen                         saturations were monitored continuously. The Endoscope                         was introduced through the mouth, and advanced to the  second part of duodenum. The upper GI endoscopy was                         accomplished without difficulty. The patient tolerated                         the procedure well. Findings:      The second portion of the duodenum was normal.      Diffuse mild inflammation characterized by congestion (edema) and       erosions was found in the duodenal bulb.      Multiple dispersed diminutive erosions with stigmata of recent bleeding       were found in the gastric antrum. Biopsies were taken with a cold       forceps for histology.      Mild, diffuse portal hypertensive  gastropathy was found in the gastric       fundus and in the gastric body.      A medium amount of food (residue) was found in the gastric fundus.      LA Grade A (one or more mucosal breaks less than 5 mm, not extending       between tops of 2 mucosal folds) esophagitis with no bleeding was found       in the distal esophagus.      Esophagogastric landmarks were identified: the gastroesophageal junction       was found at 39 cm from the incisors. Impression:            - Normal second portion of the duodenum.                        - Duodenitis.                        - Erosive gastropathy with stigmata of recent                         bleeding. Biopsied.                        - Portal hypertensive gastropathy.                        - A medium amount of food (residue) in the stomach.                        - LA Grade A reflux esophagitis with no bleeding.                        - Esophagogastric landmarks identified. Recommendation:        - Discharge patient to home (with escort).                        - Low sodium diet.                        - Continue present medications.                        - Await pathology results. Procedure Code(s):     --- Professional ---  56760, Esophagogastroduodenoscopy, flexible,                         transoral; with biopsy, single or multiple Diagnosis Code(s):     --- Professional ---                        K29.80, Duodenitis without bleeding                        K92.2, Gastrointestinal hemorrhage, unspecified                        K76.6, Portal hypertension                        K31.89, Other diseases of stomach and duodenum                        K21.00, Gastro-esophageal reflux disease with                         esophagitis, without bleeding                        K74.60, Unspecified cirrhosis of liver CPT copyright 2022 American Medical Association. All rights reserved. The codes documented in this report are  preliminary and upon coder review may  be revised to meet current compliance requirements. Dr. Corinn Brooklyn Corinn Jess Brooklyn MD, MD 06/05/2024 11:09:13 AM This report has been signed electronically. Number of Addenda: 0 Note Initiated On: 06/05/2024 10:44 AM Estimated Blood Loss:  Estimated blood loss: none.      [facility]

## 2024-06-05 NOTE — Transfer of Care (Signed)
 Immediate Anesthesia Transfer of Care Note  Patient: Derrick Blanchard  Procedure(s) Performed: EGD (ESOPHAGOGASTRODUODENOSCOPY)  Patient Location: PACU  Anesthesia Type:MAC  Level of Consciousness: drowsy  Airway & Oxygen Therapy: Patient Spontanous Breathing and Patient connected to nasal cannula oxygen  Post-op Assessment: Report given to RN and Post -op Vital signs reviewed and stable  Post vital signs: Reviewed and stable  Last Vitals:  Vitals Value Taken Time  BP 97/50 06/05/24 11:09  Temp 36.3 C 06/05/24 11:09  Pulse 64 06/05/24 11:09  Resp 14 06/05/24 11:09  SpO2 95 % 06/05/24 11:09    Last Pain:  Vitals:   06/05/24 1111  TempSrc:   PainSc: Asleep         Complications: No notable events documented.

## 2024-06-05 NOTE — Anesthesia Postprocedure Evaluation (Signed)
 Anesthesia Post Note  Patient: Derrick Blanchard  Procedure(s) Performed: EGD (ESOPHAGOGASTRODUODENOSCOPY)  Patient location during evaluation: PACU Anesthesia Type: General Level of consciousness: awake and alert Pain management: satisfactory to patient Vital Signs Assessment: post-procedure vital signs reviewed and stable Respiratory status: spontaneous breathing Cardiovascular status: stable Anesthetic complications: no   No notable events documented.   Last Vitals:  Vitals:   06/05/24 1109 06/05/24 1119  BP: (!) 97/50 (!) 88/46  Pulse: 64 71  Resp: 14 14  Temp: (!) 36.3 C   SpO2: 95% 98%    Last Pain:  Vitals:   06/05/24 1119  TempSrc:   PainSc: Asleep                 VAN STAVEREN,Travarius Lange

## 2024-06-06 LAB — SURGICAL PATHOLOGY

## 2024-06-15 ENCOUNTER — Other Ambulatory Visit: Payer: Self-pay

## 2024-06-16 ENCOUNTER — Inpatient Hospital Stay
Admission: EM | Admit: 2024-06-16 | Discharge: 2024-06-22 | DRG: 445 | Disposition: A | Discharge status: Home / Self Care

## 2024-06-16 ENCOUNTER — Emergency Department

## 2024-06-16 ENCOUNTER — Other Ambulatory Visit: Payer: Self-pay

## 2024-06-16 DIAGNOSIS — R197 Diarrhea, unspecified: Secondary | ICD-10-CM | POA: Diagnosis not present

## 2024-06-16 DIAGNOSIS — A0811 Acute gastroenteropathy due to Norwalk agent: Secondary | ICD-10-CM | POA: Diagnosis present

## 2024-06-16 DIAGNOSIS — K8020 Calculus of gallbladder without cholecystitis without obstruction: Secondary | ICD-10-CM | POA: Diagnosis present

## 2024-06-16 DIAGNOSIS — Z59868 Other specified financial insecurity: Secondary | ICD-10-CM

## 2024-06-16 DIAGNOSIS — E876 Hypokalemia: Secondary | ICD-10-CM | POA: Diagnosis not present

## 2024-06-16 DIAGNOSIS — Z5941 Food insecurity: Secondary | ICD-10-CM | POA: Diagnosis not present

## 2024-06-16 DIAGNOSIS — Z1152 Encounter for screening for COVID-19: Secondary | ICD-10-CM | POA: Diagnosis not present

## 2024-06-16 DIAGNOSIS — I10 Essential (primary) hypertension: Secondary | ICD-10-CM | POA: Diagnosis present

## 2024-06-16 DIAGNOSIS — R32 Unspecified urinary incontinence: Secondary | ICD-10-CM | POA: Diagnosis present

## 2024-06-16 DIAGNOSIS — D6959 Other secondary thrombocytopenia: Secondary | ICD-10-CM | POA: Diagnosis present

## 2024-06-16 DIAGNOSIS — Z5982 Transportation insecurity: Secondary | ICD-10-CM

## 2024-06-16 DIAGNOSIS — K766 Portal hypertension: Secondary | ICD-10-CM | POA: Diagnosis present

## 2024-06-16 DIAGNOSIS — K7460 Unspecified cirrhosis of liver: Secondary | ICD-10-CM | POA: Diagnosis not present

## 2024-06-16 DIAGNOSIS — B1920 Unspecified viral hepatitis C without hepatic coma: Secondary | ICD-10-CM | POA: Diagnosis present

## 2024-06-16 DIAGNOSIS — F1020 Alcohol dependence, uncomplicated: Secondary | ICD-10-CM | POA: Diagnosis present

## 2024-06-16 DIAGNOSIS — D693 Immune thrombocytopenic purpura: Secondary | ICD-10-CM | POA: Diagnosis present

## 2024-06-16 DIAGNOSIS — Z801 Family history of malignant neoplasm of trachea, bronchus and lung: Secondary | ICD-10-CM | POA: Diagnosis not present

## 2024-06-16 DIAGNOSIS — I8510 Secondary esophageal varices without bleeding: Secondary | ICD-10-CM | POA: Diagnosis present

## 2024-06-16 DIAGNOSIS — F17210 Nicotine dependence, cigarettes, uncomplicated: Secondary | ICD-10-CM | POA: Diagnosis present

## 2024-06-16 DIAGNOSIS — K7030 Alcoholic cirrhosis of liver without ascites: Secondary | ICD-10-CM | POA: Diagnosis present

## 2024-06-16 DIAGNOSIS — Z89512 Acquired absence of left leg below knee: Secondary | ICD-10-CM | POA: Diagnosis not present

## 2024-06-16 DIAGNOSIS — K760 Fatty (change of) liver, not elsewhere classified: Secondary | ICD-10-CM | POA: Diagnosis present

## 2024-06-16 DIAGNOSIS — D61818 Other pancytopenia: Secondary | ICD-10-CM | POA: Diagnosis present

## 2024-06-16 DIAGNOSIS — B182 Chronic viral hepatitis C: Secondary | ICD-10-CM | POA: Diagnosis not present

## 2024-06-16 DIAGNOSIS — Z89519 Acquired absence of unspecified leg below knee: Secondary | ICD-10-CM

## 2024-06-16 DIAGNOSIS — K819 Cholecystitis, unspecified: Secondary | ICD-10-CM | POA: Diagnosis present

## 2024-06-16 LAB — COMPREHENSIVE METABOLIC PANEL WITH GFR
ALT: 23 U/L (ref 0–44)
AST: 52 U/L — ABNORMAL HIGH (ref 15–41)
Albumin: 3.9 g/dL (ref 3.5–5.0)
Alkaline Phosphatase: 159 U/L — ABNORMAL HIGH (ref 38–126)
Anion gap: 12 (ref 5–15)
BUN: 8 mg/dL (ref 6–20)
CO2: 22 mmol/L (ref 22–32)
Calcium: 9.2 mg/dL (ref 8.9–10.3)
Chloride: 111 mmol/L (ref 98–111)
Creatinine, Ser: 0.42 mg/dL — ABNORMAL LOW (ref 0.61–1.24)
GFR, Estimated: >60 mL/min (ref >60)
Glucose, Bld: 101 mg/dL — ABNORMAL HIGH (ref 70–99)
Potassium: 3.5 mmol/L (ref 3.5–5.1)
Sodium: 145 mmol/L (ref 135–145)
Total Bilirubin: 2.6 mg/dL — ABNORMAL HIGH (ref 0.0–1.2)
Total Protein: 7.5 g/dL (ref 6.5–8.1)

## 2024-06-16 LAB — URINALYSIS, ROUTINE W REFLEX MICROSCOPIC
Bilirubin Urine: NEGATIVE
Glucose, UA: NEGATIVE mg/dL
Ketones, ur: NEGATIVE mg/dL
Leukocytes,Ua: NEGATIVE
Nitrite: NEGATIVE
Protein, ur: NEGATIVE mg/dL
RBC / HPF: 0 RBC/hpf (ref 0–5)
Specific Gravity, Urine: 1.001 — ABNORMAL LOW (ref 1.005–1.030)
Squamous Epithelial / HPF: 0 /HPF (ref 0–5)
pH: 7 (ref 5.0–8.0)

## 2024-06-16 LAB — RESP PANEL BY RT-PCR (RSV, FLU A&B, COVID)  RVPGX2
Influenza A by PCR: NEGATIVE
Influenza B by PCR: NEGATIVE
Resp Syncytial Virus by PCR: NEGATIVE
SARS Coronavirus 2 by RT PCR: NEGATIVE

## 2024-06-16 LAB — CBC
HCT: 40.3 % (ref 39.0–52.0)
Hemoglobin: 13.9 g/dL (ref 13.0–17.0)
MCH: 29.1 pg (ref 26.0–34.0)
MCHC: 34.5 g/dL (ref 30.0–36.0)
MCV: 84.5 fL (ref 80.0–100.0)
Platelets: 59 K/uL — ABNORMAL LOW (ref 150–400)
RBC: 4.77 MIL/uL (ref 4.22–5.81)
RDW: 15.2 % (ref 11.5–15.5)
WBC: 5.7 K/uL (ref 4.0–10.5)
nRBC: 0 % (ref 0.0–0.2)

## 2024-06-16 LAB — AMMONIA: Ammonia: 35 umol/L (ref 9–35)

## 2024-06-16 LAB — LIPASE, BLOOD: Lipase: 47 U/L (ref 11–51)

## 2024-06-16 LAB — PROTIME-INR
INR: 1.5 — ABNORMAL HIGH (ref 0.8–1.2)
Prothrombin Time: 18.4 s — ABNORMAL HIGH (ref 11.4–15.2)

## 2024-06-16 MED ORDER — IOHEXOL 300 MG/ML  SOLN
100.0000 mL | Freq: Once | INTRAMUSCULAR | Status: AC | PRN
Start: 2024-06-16 — End: 2024-06-16
  Administered 2024-06-16: 100 mL via INTRAVENOUS

## 2024-06-16 MED ORDER — HYDROMORPHONE HCL 1 MG/ML IJ SOLN
0.5000 mg | INTRAMUSCULAR | Status: AC | PRN
  Administered 2024-06-16 – 2024-06-18 (×4): 0.5 mg via INTRAVENOUS
  Filled 2024-06-16 (×4): qty 0.5

## 2024-06-16 MED ORDER — FENTANYL CITRATE (PF) 50 MCG/ML IJ SOSY
50.0000 ug | PREFILLED_SYRINGE | Freq: Once | INTRAMUSCULAR | Status: AC
  Administered 2024-06-16: 50 ug via INTRAVENOUS
  Filled 2024-06-16: qty 1

## 2024-06-16 MED ORDER — MELATONIN 5 MG PO TABS: 5.0000 mg | ORAL_TABLET | Freq: Every evening | ORAL | Status: DC | PRN

## 2024-06-16 MED ORDER — SODIUM CHLORIDE 0.9 % IV BOLUS
500.0000 mL | Freq: Once | INTRAVENOUS | Status: AC
  Administered 2024-06-16: 500 mL via INTRAVENOUS

## 2024-06-16 MED ORDER — ACETAMINOPHEN 500 MG PO TABS: 500.0000 mg | ORAL_TABLET | Freq: Four times a day (QID) | ORAL | Status: DC | PRN

## 2024-06-16 MED ORDER — PROCHLORPERAZINE EDISYLATE 10 MG/2ML IJ SOLN: 5.0000 mg | Freq: Four times a day (QID) | INTRAMUSCULAR | Status: DC | PRN

## 2024-06-16 MED ORDER — PIPERACILLIN-TAZOBACTAM 3.375 G IVPB
3.3750 g | Freq: Three times a day (TID) | INTRAVENOUS | Status: DC
  Administered 2024-06-17 – 2024-06-18 (×5): 3.375 g via INTRAVENOUS
  Filled 2024-06-16 (×4): qty 50

## 2024-06-16 MED ORDER — PIPERACILLIN-TAZOBACTAM 3.375 G IVPB 30 MIN
3.3750 g | Freq: Once | INTRAVENOUS | Status: AC
  Administered 2024-06-16: 3.375 g via INTRAVENOUS
  Filled 2024-06-16 (×2): qty 50

## 2024-06-16 MED ORDER — ONDANSETRON HCL 4 MG/2ML IJ SOLN
4.0000 mg | Freq: Once | INTRAMUSCULAR | Status: AC
  Administered 2024-06-16: 4 mg via INTRAVENOUS
  Filled 2024-06-16: qty 2

## 2024-06-16 MED ORDER — OXYCODONE HCL 5 MG PO TABS
5.0000 mg | ORAL_TABLET | ORAL | Status: AC | PRN
  Administered 2024-06-17 – 2024-06-18 (×4): 5 mg via ORAL
  Filled 2024-06-16 (×4): qty 1

## 2024-06-16 NOTE — ED Triage Notes (Signed)
 Pt [facility] with c/o abdominal pain RUQ x2 days and also c/o stool incontinence x2 days. Pt has hx of RUQ pain, never experienced stool incontinence before. Abdominal pain 7/10. Pt reports drinking ETOH x4 days. Pt has prior hx of chronic liver cirrhosis.  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

## 2024-06-16 NOTE — ED Triage Notes (Signed)
 Arrived by [facility] from home. C/o abdominal pain, RLQ. Reports some incontinence.   Prescribed oxy  Below the knee left amputation.   EMS vitals WNL per EMS

## 2024-06-16 NOTE — H&P (Incomplete)
 History and Physical  Derrick Blanchard FMW:[id number] DOB: [birth year] DOA: 06/16/2024  Referring physician: ***  PCP: Patient, No Pcp Per  Outpatient Specialists: *** Patient coming from: *** & is able to ambulate ***  Chief Complaint: ***   HPI: Derrick Blanchard is a 43 y.o. male with medical history significant for *** (For level 3, the HPI must include 4+ descriptors: Location, Quality, Severity, Duration, Timing, Context, modifying factors, associated signs/symptoms and/or status of 3+ chronic problems.)   ED Course: ***  Review of Systems: Review of systems as noted in the HPI. All other systems reviewed and are negative.   Past Medical History:  Diagnosis Date   AKI (acute kidney injury)    Anemia    Anxiety    Cirrhosis (HCC)    Depression    Heart murmur    Hepatitis    Hypertension    Severe alcohol use disorder (HCC)    Substance abuse (HCC)    Tachycardia    Past Surgical History:  Procedure Laterality Date   AMPUTATION Left 12/29/2023   Procedure: AMPUTATION, FOOT, RAY;  Surgeon: Malvin Marsa FALCON, DPM;  Location: [facility] ORS;  Service: Orthopedics/Podiatry;  Laterality: Left;  Partial first ray amp, abx beads, graft application   AMPUTATION Left 02/12/2024   Procedure: AMPUTATION BELOW KNEE;  Surgeon: Marea Selinda RAMAN, MD;  Location: [facility] ORS;  Service: General;  Laterality: Left;   COLONOSCOPY     ESOPHAGOGASTRODUODENOSCOPY N/A 06/05/2024   Procedure: EGD (ESOPHAGOGASTRODUODENOSCOPY);  Surgeon: Unk [name], MD;  Location: [facility] ENDOSCOPY;  Service: Gastroenterology;  Laterality: N/A;   ESOPHAGOGASTRODUODENOSCOPY (EGD) WITH PROPOFOL  N/A 02/03/2015   Procedure: ESOPHAGOGASTRODUODENOSCOPY (EGD) WITH PROPOFOL ;  Surgeon: Donnice Vaughn Manes, MD;  Location: [facility] ENDOSCOPY;  Service: Endoscopy;  Laterality: N/A;   ESOPHAGOGASTRODUODENOSCOPY (EGD) WITH PROPOFOL  N/A 12/21/2017   Procedure: ESOPHAGOGASTRODUODENOSCOPY (EGD) WITH PROPOFOL ;  Surgeon: Therisa Bi, MD;  Location: [facility] ENDOSCOPY;  Service: Gastroenterology;  Laterality: N/A;   FISSURECTOMY     IRRIGATION AND DEBRIDEMENT FOOT Left 12/16/2023   Procedure: IRRIGATION AND DEBRIDEMENT FOOT, possible sesamoidectomy, bone biopsy;  Surgeon: Lamount Ethan CROME, DPM;  Location: [facility] ORS;  Service: Podiatry;  Laterality: Left;  Pulse lavage, bone biopsy with jam shidi needle, mini c arm, sagittal saw available   Right transmet amputation Right    TRANSMETATARSAL AMPUTATION Left 01/03/2024   Procedure: AMPUTATION, FOOT, TRANSMETATARSAL;  Surgeon: Malvin Marsa FALCON, DPM;  Location: [facility] ORS;  Service: Orthopedics/Podiatry;  Laterality: Left;  Left foot Trans met amputation    Social History:  reports that he has been smoking cigarettes. He has never used smokeless tobacco. He reports that he does not currently use alcohol. He reports current drug use. Drug: Marijuana.   No Known Allergies  Family History  Problem Relation Age of Onset   Lung cancer Mother    Cirrhosis Father    Lung cancer Paternal Grandfather     ***  Prior to Admission medications   Medication Sig Start Date End Date Taking? Authorizing Provider  carvedilol (COREG) 3.125 MG tablet Take 3.125 mg by mouth.    [provider]  ferrous sulfate (FER-IN-SOL) 75 (15 Fe) MG/ML SOLN Take 325 mg by mouth.    [provider]  folic acid  (FOLVITE ) 1 MG tablet Take 1 tablet (1 mg total) by mouth daily. 01/23/24   Derrick Calvin NOVAK, MD  lactulose  (CHRONULAC ) 10 GM/15ML solution Take 30 mLs (20 g total) by mouth daily. Patient not taking: Reported on 03/26/2024 01/24/24  Derrick Calvin NOVAK, MD  Multiple Vitamin (MULTIVITAMIN WITH MINERALS) TABS tablet Take 1 tablet by mouth daily. Patient not taking: Reported on 03/26/2024 01/23/24   Derrick Calvin NOVAK, MD  omeprazole  (PRILOSEC) 20 MG capsule Take 1 capsule (20 mg total) by mouth 2 (two) times daily before a meal. 06/05/24 12/02/24  Vanga, [name], MD   polyethylene glycol (MIRALAX  / GLYCOLAX ) 17 g packet Take 17 g by mouth daily. Patient not taking: Reported on 03/26/2024 02/24/24   Dorinda Drue DASEN, MD  pregabalin  (LYRICA ) 200 MG capsule Take 1 capsule (200 mg total) by mouth 2 (two) times daily. 02/23/24   Dorinda Drue DASEN, MD  Sofosbuvir-Velpatasvir 400-100 MG TABS Take by mouth. Patient not taking: Reported on 06/05/2024    [provider]  thiamine  (VITAMIN B1) 100 MG tablet Take 1 tablet (100 mg total) by mouth daily. 01/23/24   Derrick Calvin NOVAK, MD    Physical Exam: BP 99/63   Pulse 65   Temp 98.5 F (36.9 C) (Oral)   Resp 15   Ht 5' 9 (1.753 m)   Wt 91.6 kg   SpO2 97%   BMI 29.83 kg/m   General: 43 y.o. year-old male well developed well nourished in no acute distress.  Alert and oriented x3. Cardiovascular: Regular rate and rhythm with no rubs or gallops.  No thyromegaly or JVD noted.  No lower extremity edema. 2/4 pulses in all 4 extremities. Respiratory: Clear to auscultation with no wheezes or rales. Good inspiratory effort. Abdomen: Soft nontender nondistended with normal bowel sounds x4 quadrants. Muskuloskeletal: No cyanosis, clubbing or edema noted bilaterally Neuro: CN II-XII intact, strength, sensation, reflexes Skin: No ulcerative lesions noted or rashes Psychiatry: Judgement and insight appear normal. Mood is appropriate for condition and setting          Labs on Admission:  Basic Metabolic Panel: Recent Labs  Lab 06/16/24 1810  NA 145  K 3.5  CL 111  CO2 22  GLUCOSE 101*  BUN 8  CREATININE 0.42*  CALCIUM  9.2   Liver Function Tests: Recent Labs  Lab 06/16/24 1810  AST 52*  ALT 23  ALKPHOS 159*  BILITOT 2.6*  PROT 7.5  ALBUMIN 3.9   Recent Labs  Lab 06/16/24 1810  LIPASE 47   Recent Labs  Lab 06/16/24 1845  AMMONIA 35   CBC: Recent Labs  Lab 06/16/24 1810  WBC 5.7  HGB 13.9  HCT 40.3  MCV 84.5  PLT 59*   Cardiac Enzymes: No results for input(s): CKTOTAL, CKMB,  CKMBINDEX, TROPONINI in the last 168 hours.  BNP (last 3 results) No results for input(s): BNP in the last 8760 hours.  ProBNP (last 3 results) No results for input(s): PROBNP in the last 8760 hours.  CBG: No results for input(s): GLUCAP in the last 168 hours.  Radiological Exams on Admission: US  ABDOMEN LIMITED RUQ (LIVER/GB) Result Date: 06/16/2024 CLINICAL DATA:  Right upper quadrant pain EXAM: ULTRASOUND ABDOMEN LIMITED RIGHT UPPER QUADRANT COMPARISON:  CT today FINDINGS: Gallbladder: Gallstones as seen on CT. Gallbladder appears contracted. Mild gallbladder wall thickening at 5 mm. Negative sonographic Murphy sign. Common bile duct: Diameter: Normal caliber, 3 mm. Liver: Mildly increased echotexture throughout the liver. Nodular contours compatible with cirrhosis. No focal hepatic abnormality. Portal vein is patent on color Doppler imaging with normal direction of blood flow towards the liver. Other: None. IMPRESSION: Cholelithiasis. Gallbladder is contracted with mild gallbladder wall thickening. This could be related to the contracted state, liver disease  or chronic cholecystitis. Fatty liver, changes of cirrhosis. Electronically Signed   By: Franky Crease M.D.   On: 06/16/2024 20:55   CT ABDOMEN PELVIS W CONTRAST Result Date: 06/16/2024 CLINICAL DATA:  Right-sided abdominal pain for 2 days EXAM: CT ABDOMEN AND PELVIS WITH CONTRAST TECHNIQUE: Multidetector CT imaging of the abdomen and pelvis was performed using the standard protocol following bolus administration of intravenous contrast. RADIATION DOSE REDUCTION: This exam was performed according to the departmental dose-optimization program which includes automated exposure control, adjustment of the mA and/or kV according to patient size and/or use of iterative reconstruction technique. CONTRAST:  OMNIPAQUE  IOHEXOL  300 MG/ML  SOLN COMPARISON:  03/10/2023 FINDINGS: Lower chest: No acute abnormality. Hepatobiliary: Liver is  well visualized with diffuse nodularity consistent with underlying cirrhosis. 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 This spontaneously decompresses into the right common femoral vein and to a lesser degree in the left common femoral vein. These changes are stable from the prior exam. Gallbladder is well distended with dependent gallstones. Pancreas: Unremarkable. No pancreatic ductal dilatation or surrounding inflammatory changes. Spleen: Mild splenomegaly. Adrenals/Urinary Tract: Adrenal glands are within normal limits. Kidneys demonstrate a normal enhancement pattern bilaterally. No renal calculi are seen. No obstructive changes are noted. The bladder is well distended. Stomach/Bowel: The appendix is within normal limits. No obstructive or inflammatory changes of the colon are seen. Stomach and small bowel are within normal limits. Extensive esophageal varices are noted. Vascular/Lymphatic: Aortic atherosclerosis. No enlarged abdominal or pelvic lymph nodes. Reproductive: Prostate is unremarkable. Other: No abdominal wall hernia or abnormality. No abdominopelvic ascites. Musculoskeletal: No acute or significant osseous findings. IMPRESSION: Changes consistent with cirrhosis of the liver with portal hypertension and recanalization of the umbilical vein. Associated splenomegaly is noted as well. Extensive esophageal varices. Cholelithiasis without complicating factors. No acute abnormality noted. Electronically Signed   By: Oneil Devonshire M.D.   On: 06/16/2024 19:10    EKG: I independently viewed the EKG done and my findings are as followed: ***   Assessment/Plan Present on Admission:  Cholecystitis  Principal Problem:   Cholecystitis   DVT prophylaxis: ***   Code Status: ***   Family Communication: ***   Disposition Plan: ***   Consults called: ***   Admission status: ***    Status is: Inpatient {Inpatient:23812}   Terry LOISE Hurst  MD [facility] Pager [phone]  If 7PM-7AM, please contact night-coverage [email or web address] Password [email or web address]  06/16/2024, 11:09 PM

## 2024-06-16 NOTE — ED Notes (Signed)
 Patient provided with ice water and a warm blanket per request. No other needs stated at this time. Call bell is within patients reach.

## 2024-06-16 NOTE — ED Provider Notes (Signed)
 [facility] Provider Note    Event Date/Time   First MD Initiated Contact with Patient 06/16/24 1748     (approximate)   History   Abdominal Pain and incontienent of stool  Arrived by [facility] from home. C/o abdominal pain, RLQ. Reports some incontinence.   Prescribed oxy  Below the knee left amputation.   EMS vitals WNL per EMS  Pt [facility] with c/o abdominal pain RUQ x2 days and also c/o stool incontinence x2 days. Pt has hx of RUQ pain, never experienced stool incontinence before. Abdominal pain 7/10. Pt reports drinking ETOH x4 days. Pt has prior hx of chronic liver cirrhosis.  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    HPI Derrick Blanchard is a 43 y.o. male PMH alcoholic liver cirrhosis, alcoholic hepatitis, severe alcohol use disorder, prior osteomyelitis status post left leg amputation presents for evaluation of abdominal pain - Patient states over the past day and a half he has had severe right-sided abdominal pain.  Multiple episodes of yellow diarrhea.  1 episode of vomiting.  Some ongoing nausea.  No chest pain.  No frank fevers.  Says he has had stool incontinence and increased urinary frequency. - No known sick contacts - No black or bloody stools, no hematemesis    EGD 06/05/2024: Impression: - Normal second portion of the duodenum. - Duodenitis. - Erosive gastropathy with stigmata of recent bleeding. Biopsied. - Portal hypertensive gastropathy. - A medium amount of food ( residue) in the stomach. - LA Grade A reflux esophagitis with no bleeding. - Esophagogastric landmarks identified.     Physical Exam   Triage Vital Signs: ED Triage Vitals  Encounter Vitals Group     BP 06/16/24 1735 125/81     Girls Systolic BP Percentile --      Girls Diastolic BP Percentile --      Boys Systolic BP Percentile --      Boys Diastolic BP Percentile --      Pulse Rate 06/16/24 1735 79     Resp 06/16/24 1735 16     Temp 06/16/24  1735 98.6 F (37 C)     Temp Source 06/16/24 1735 Oral     SpO2 06/16/24 1735 95 %     Weight 06/16/24 1736 202 lb (91.6 kg)     Height 06/16/24 1736 5' 9 (1.753 m)     Head Circumference --      Peak Flow --      Pain Score 06/16/24 1736 7     Pain Loc --      Pain Education --      Exclude from Growth Chart --     Most recent vital signs: Vitals:   06/16/24 2005 06/16/24 2252  BP: 99/63   Pulse: 65   Resp: 15   Temp: 98.2 F (36.8 C) 98.5 F (36.9 C)  SpO2: 97%      General: Awake, no distress.  CV:  Good peripheral perfusion. RRR, RP 2+ Resp:  Normal effort. CTAB Abd:   Notable tenderness with voluntary guarding in right upper and lower quadrant.  Trace tenderness in left upper and lower quadrants.  Not grossly distended    ED Results / Procedures / Treatments   Labs (all labs ordered are listed, but only abnormal results are displayed) Labs Reviewed  COMPREHENSIVE METABOLIC PANEL WITH GFR - Abnormal; Notable for the following components:      Result Value   Glucose, Bld  101 (*)    Creatinine, Ser 0.42 (*)    AST 52 (*)    Alkaline Phosphatase 159 (*)    Total Bilirubin 2.6 (*)    All other components within normal limits  CBC - Abnormal; Notable for the following components:   Platelets 59 (*)    All other components within normal limits  URINALYSIS, ROUTINE W REFLEX MICROSCOPIC - Abnormal; Notable for the following components:   Color, Urine STRAW (*)    APPearance CLEAR (*)    Specific Gravity, Urine 1.001 (*)    Hgb urine dipstick SMALL (*)    Bacteria, UA RARE (*)    All other components within normal limits  PROTIME-INR - Abnormal; Notable for the following components:   Prothrombin Time 18.4 (*)    INR 1.5 (*)    All other components within normal limits  RESP PANEL BY RT-PCR (RSV, FLU A&B, COVID)  RVPGX2  LIPASE, BLOOD  AMMONIA  ETHANOL  HIV ANTIBODY (ROUTINE TESTING W REFLEX)  CBC  COMPREHENSIVE METABOLIC PANEL WITH GFR  MAGNESIUM    PHOSPHORUS     EKG  Ecg = nsr, rate 60, no ste/std, no repol abnl, normal axis, normal intervals. No e/o ischemia, arrhythmia on my eval.    RADIOLOGY Radiology interpreted by myself and radiology report reviewed.  Notable cholelithiasis, ultrasound concerning for possible cholecystitis.    PROCEDURES:  Critical Care performed: No  Procedures   MEDICATIONS ORDERED IN ED: Medications  acetaminophen  (TYLENOL ) tablet 500 mg (has no administration in time range)  prochlorperazine (COMPAZINE) injection 5 mg (has no administration in time range)  melatonin tablet 5 mg (has no administration in time range)  piperacillin -tazobactam (ZOSYN ) IVPB 3.375 g (has no administration in time range)  oxyCODONE  (Oxy IR/ROXICODONE ) immediate release tablet 5 mg (has no administration in time range)  HYDROmorphone  (DILAUDID ) injection 0.5 mg (has no administration in time range)  sodium chloride  0.9 % bolus 500 mL (0 mLs Intravenous Stopped 06/16/24 2000)  ondansetron  (ZOFRAN ) injection 4 mg (4 mg Intravenous Given 06/16/24 1835)  fentaNYL  (SUBLIMAZE ) injection 50 mcg (50 mcg Intravenous Given 06/16/24 1835)  iohexol  (OMNIPAQUE ) 300 MG/ML solution 100 mL (100 mLs Intravenous Contrast Given 06/16/24 1857)  piperacillin -tazobactam (ZOSYN ) IVPB 3.375 g (3.375 g Intravenous New Bag/Given 06/16/24 2305)     IMPRESSION / MDM / ASSESSMENT AND PLAN / ED COURSE  I reviewed the triage vital signs and the nursing notes.                              DDX/MDM/AP: Differential diagnosis includes, but is not limited to, cholecystitis, appendicitis, pancreatitis, gastroenteritis, doubt bowel obstruction.  Consider underlying UTI.  Do not clinically suspect C. difficile.  Short timeframe of symptoms and no preceding antibiotics per patient.  Not clinically suspect SBP at this time with very focal findings on exam.  Plan: - Labs - Pain control - IV fluid, Zofran  - EKG CT abdomen pelvis  Patient's  presentation is most consistent with acute presentation with potential threat to life or bodily function.  The patient is on the cardiac monitor to evaluate for evidence of arrhythmia and/or significant heart rate changes.  ED course below.  Workup concerning for cholecystitis.  Discussed with general surgery who agrees.  Admitted to medicine service given comorbidities.  Treating with Zosyn .  Clinical Course as of 06/16/24 2345  Sun Jun 16, 2024  1820 UA w/ no e/o infxn [MM]  1827 Lipase  wnl [MM]  1840 CMP w/ bili 2.6, very mild AST elevation [MM]  1902 CBC with stable thrombocytopenia, otherwise unremarkable [MM]  1927 Ammonia wnl [MM]  1928 CTAP: IMPRESSION: Changes consistent with cirrhosis of the liver with portal hypertension and recanalization of the umbilical vein. Associated splenomegaly is noted as well.  Extensive esophageal varices.  Cholelithiasis without complicating factors.  No acute abnormality noted.   [MM]  2006 Viral swab neg [MM]  2007 Last CMP on my chart review was from July of this year, bili 1.1 at that time [MM]  2012 Evaluated, somewhat sleepy but arousable.  Exam improved though does have some persistent mild right upper quadrant pain.  Given the bump in bilirubin will escalate to ultrasound though I suspect he more likely has elevated bilirubin in the setting of his known cirrhosis as opposed to biliary tract obstruction, no evidence of ductal dilatation on CT [MM]  2131 US : IMPRESSION: Cholelithiasis. Gallbladder is contracted with mild gallbladder wall thickening. This could be related to the contracted state, liver disease or chronic cholecystitis.  Fatty liver, changes of cirrhosis.   [MM]  2235 Patient with persistent right upper quadrant pain  General Surgery paged to discuss [MM]  2240 D/w Dr. Marinda of general surgery Does agree with concern for cholecystitis Recommends empiric antibiotics and admission to hospital service, will consult  inpatient  Hospitalist consult order placed. [MM]    Clinical Course User Index [MM] Clarine Ozell LABOR, MD     FINAL CLINICAL IMPRESSION(S) / ED DIAGNOSES   Final diagnoses:  Cholecystitis     Rx / DC Orders   ED Discharge Orders     None        Note:  This document was prepared using Dragon voice recognition software and may include unintentional dictation errors.   Clarine Ozell LABOR, MD 06/16/24 [phone]

## 2024-06-17 ENCOUNTER — Inpatient Hospital Stay

## 2024-06-17 DIAGNOSIS — R197 Diarrhea, unspecified: Secondary | ICD-10-CM

## 2024-06-17 DIAGNOSIS — F1020 Alcohol dependence, uncomplicated: Secondary | ICD-10-CM

## 2024-06-17 DIAGNOSIS — B182 Chronic viral hepatitis C: Secondary | ICD-10-CM

## 2024-06-17 DIAGNOSIS — K7030 Alcoholic cirrhosis of liver without ascites: Secondary | ICD-10-CM | POA: Diagnosis not present

## 2024-06-17 DIAGNOSIS — K7460 Unspecified cirrhosis of liver: Secondary | ICD-10-CM | POA: Diagnosis not present

## 2024-06-17 DIAGNOSIS — K819 Cholecystitis, unspecified: Secondary | ICD-10-CM | POA: Diagnosis not present

## 2024-06-17 DIAGNOSIS — K8020 Calculus of gallbladder without cholecystitis without obstruction: Secondary | ICD-10-CM | POA: Diagnosis not present

## 2024-06-17 DIAGNOSIS — D693 Immune thrombocytopenic purpura: Secondary | ICD-10-CM

## 2024-06-17 LAB — CBC
HCT: 37.1 % — ABNORMAL LOW (ref 39.0–52.0)
Hemoglobin: 12.3 g/dL — ABNORMAL LOW (ref 13.0–17.0)
MCH: 28.5 pg (ref 26.0–34.0)
MCHC: 33.2 g/dL (ref 30.0–36.0)
MCV: 86.1 fL (ref 80.0–100.0)
Platelets: 46 K/uL — ABNORMAL LOW (ref 150–400)
RBC: 4.31 MIL/uL (ref 4.22–5.81)
RDW: 14.9 % (ref 11.5–15.5)
WBC: 4.1 K/uL (ref 4.0–10.5)
nRBC: 0 % (ref 0.0–0.2)

## 2024-06-17 LAB — MAGNESIUM: Magnesium: 1.8 mg/dL (ref 1.7–2.4)

## 2024-06-17 LAB — COMPREHENSIVE METABOLIC PANEL WITH GFR
ALT: 18 U/L (ref 0–44)
AST: 44 U/L — ABNORMAL HIGH (ref 15–41)
Albumin: 3.5 g/dL (ref 3.5–5.0)
Alkaline Phosphatase: 138 U/L — ABNORMAL HIGH (ref 38–126)
Anion gap: 11 (ref 5–15)
BUN: 8 mg/dL (ref 6–20)
CO2: 20 mmol/L — ABNORMAL LOW (ref 22–32)
Calcium: 8.9 mg/dL (ref 8.9–10.3)
Chloride: 113 mmol/L — ABNORMAL HIGH (ref 98–111)
Creatinine, Ser: 0.56 mg/dL — ABNORMAL LOW (ref 0.61–1.24)
GFR, Estimated: >60 mL/min (ref >60)
Glucose, Bld: 91 mg/dL (ref 70–99)
Potassium: 3.5 mmol/L (ref 3.5–5.1)
Sodium: 144 mmol/L (ref 135–145)
Total Bilirubin: 2.3 mg/dL — ABNORMAL HIGH (ref 0.0–1.2)
Total Protein: 6.7 g/dL (ref 6.5–8.1)

## 2024-06-17 LAB — ETHANOL: Alcohol, Ethyl (B): 186 mg/dL — ABNORMAL HIGH (ref <15)

## 2024-06-17 LAB — PHOSPHORUS: Phosphorus: 3.1 mg/dL (ref 2.5–4.6)

## 2024-06-17 LAB — HIV ANTIBODY (ROUTINE TESTING W REFLEX): HIV Screen 4th Generation wRfx: NONREACTIVE

## 2024-06-17 MED ORDER — PANTOPRAZOLE SODIUM 40 MG PO TBEC
40.0000 mg | DELAYED_RELEASE_TABLET | Freq: Every day | ORAL | Status: DC
  Administered 2024-06-17 – 2024-06-22 (×6): 40 mg via ORAL
  Filled 2024-06-17 (×6): qty 1

## 2024-06-17 MED ORDER — LACTATED RINGERS IV SOLN
INTRAVENOUS | Status: AC
Start: 2024-06-17 — End: 2024-06-18

## 2024-06-17 MED ORDER — CARVEDILOL 3.125 MG PO TABS
3.1250 mg | ORAL_TABLET | Freq: Two times a day (BID) | ORAL | Status: DC
  Administered 2024-06-17 – 2024-06-22 (×10): 3.125 mg via ORAL
  Filled 2024-06-17 (×10): qty 1

## 2024-06-17 MED ORDER — THIAMINE HCL 100 MG PO TABS
100.0000 mg | ORAL_TABLET | Freq: Every day | ORAL | Status: DC
  Administered 2024-06-17: 100 mg via ORAL
  Filled 2024-06-17 (×2): qty 1

## 2024-06-17 MED ORDER — THIAMINE HCL 100 MG/ML IJ SOLN
100.0000 mg | Freq: Every day | INTRAMUSCULAR | Status: DC
  Filled 2024-06-17: qty 2

## 2024-06-17 MED ORDER — THIAMINE MONONITRATE 100 MG PO TABS
100.0000 mg | ORAL_TABLET | Freq: Every day | ORAL | Status: DC
  Administered 2024-06-18 – 2024-06-22 (×5): 100 mg via ORAL
  Filled 2024-06-17 (×5): qty 1

## 2024-06-17 MED ORDER — FOLIC ACID 1 MG PO TABS
1.0000 mg | ORAL_TABLET | Freq: Every day | ORAL | Status: DC
  Administered 2024-06-17 – 2024-06-22 (×6): 1 mg via ORAL
  Filled 2024-06-17 (×6): qty 1

## 2024-06-17 MED ORDER — LORAZEPAM 2 MG/ML IJ SOLN: 1.0000 mg | INTRAMUSCULAR | Status: DC | PRN

## 2024-06-17 MED ORDER — PREGABALIN 75 MG PO CAPS
200.0000 mg | ORAL_CAPSULE | Freq: Two times a day (BID) | ORAL | Status: DC
  Administered 2024-06-17 – 2024-06-22 (×11): 200 mg via ORAL
  Filled 2024-06-17 (×11): qty 1

## 2024-06-17 MED ORDER — LACTULOSE 10 GM/15ML PO SOLN: 20.0000 g | Freq: Every day | ORAL | Status: DC

## 2024-06-17 MED ORDER — TECHNETIUM TC 99M MEBROFENIN IV KIT
7.5000 | PACK | Freq: Once | INTRAVENOUS | Status: AC | PRN
  Administered 2024-06-17: 8.14 via INTRAVENOUS

## 2024-06-17 MED ORDER — LORAZEPAM 1 MG PO TABS: 1.0000 mg | ORAL_TABLET | ORAL | Status: DC | PRN

## 2024-06-17 MED ORDER — LACTULOSE 10 GM/15ML PO SOLN
20.0000 g | Freq: Every day | ORAL | Status: DC
  Administered 2024-06-18 – 2024-06-22 (×5): 20 g via ORAL
  Filled 2024-06-17 (×5): qty 30

## 2024-06-17 MED ORDER — ADULT MULTIVITAMIN W/MINERALS CH
1.0000 | ORAL_TABLET | Freq: Every day | ORAL | Status: DC
  Administered 2024-06-18 – 2024-06-22 (×5): 1 via ORAL
  Filled 2024-06-17 (×5): qty 1

## 2024-06-17 NOTE — Assessment & Plan Note (Signed)
 Likely secondary to liver cirrhosis and alcohol abuse.  Platelets of 46 today with no obvious bleeding. - Continue to monitor

## 2024-06-17 NOTE — Assessment & Plan Note (Signed)
 Mild transaminitis with underlying liver cirrhosis. -Continue to monitor -Will resume home lactulose  from tomorrow if there is still no more diarrhea

## 2024-06-17 NOTE — ED Notes (Addendum)
 Pt states he had other belongings than what is in his room when he came but they were taken by security. There was a book bag with a broken strap that had his phone, wallet, and charger in it. He states his phone was brought to him but not his charger or his wallet. I checked with security, as well as the belongings area for the psych patients but his belongings were not found.  He stated is was a grey haired man with a spiked hairstyle.

## 2024-06-17 NOTE — Assessment & Plan Note (Signed)
 Imaging with some concern of cholelithiasis but no definitive sign of infection. General surgery was consulted and they ordered HIDA scan. - Continue with Zosyn  - Continue with supportive care

## 2024-06-17 NOTE — Assessment & Plan Note (Signed)
Counseling was provided -Continue with CIWA protocol

## 2024-06-17 NOTE — Consult Note (Addendum)
 [facility] SURGICAL CONSULTATION NOTE (initial) - cpt: 00756   HISTORY OF PRESENT ILLNESS (HPI):  43 y.o. male presented to [facility] ED yesterday secondary to abdominal pain. Patient reports the RUQ abdominal pain over the previous 48 hours prior to presentation. He reports a history of similar in the past. This has been accompanied by nausea, emesis, and stool incontinence. He reports this has happened previously as well. No fever, chills. Of note, he has a history of cirrhosis Cathe B). He follows at [facility]. He does have history of alcohol abuse and continues to drink daily anywhere from a few 25 oz cans to a 12 pack daily. Work up in the ED revealed a normal WBC to 5.7K. Hgb to 13.9, PLT 59K, sCr - 0.42, bilirubin 2.6 (now 2.3), Lipase normal at 47. He did have CT Abdomen/Pelvis which showed contraction of the gallbladder with questionable thickening. RUQ US  also showed contracted GB with cholelithiasis. He was admitted to the medicine service.   Surgery is consulted by emergency medicine physician Dr. Ozell Klein, MD in this context for evaluation and management of possible cholecystitis.  PAST MEDICAL HISTORY (PMH):  Past Medical History:  Diagnosis Date   AKI (acute kidney injury)    Anemia    Anxiety    Cirrhosis (HCC)    Depression    Heart murmur    Hepatitis    Hypertension    Severe alcohol use disorder (HCC)    Substance abuse (HCC)    Tachycardia      PAST SURGICAL HISTORY (PSH):  Past Surgical History:  Procedure Laterality Date   AMPUTATION Left 12/29/2023   Procedure: AMPUTATION, FOOT, RAY;  Surgeon: Malvin Marsa FALCON, DPM;  Location: [facility] ORS;  Service: Orthopedics/Podiatry;  Laterality: Left;  Partial first ray amp, abx beads, graft application   AMPUTATION Left 02/12/2024   Procedure: AMPUTATION BELOW KNEE;  Surgeon: Marea Selinda RAMAN, MD;  Location: [facility] ORS;  Service: General;  Laterality: Left;   COLONOSCOPY     ESOPHAGOGASTRODUODENOSCOPY N/A  06/05/2024   Procedure: EGD (ESOPHAGOGASTRODUODENOSCOPY);  Surgeon: Unk [name], MD;  Location: [facility] ENDOSCOPY;  Service: Gastroenterology;  Laterality: N/A;   ESOPHAGOGASTRODUODENOSCOPY (EGD) WITH PROPOFOL  N/A 02/03/2015   Procedure: ESOPHAGOGASTRODUODENOSCOPY (EGD) WITH PROPOFOL ;  Surgeon: Donnice Vaughn Manes, MD;  Location: [facility] ENDOSCOPY;  Service: Endoscopy;  Laterality: N/A;   ESOPHAGOGASTRODUODENOSCOPY (EGD) WITH PROPOFOL  N/A 12/21/2017   Procedure: ESOPHAGOGASTRODUODENOSCOPY (EGD) WITH PROPOFOL ;  Surgeon: Therisa Bi, MD;  Location: [facility] ENDOSCOPY;  Service: Gastroenterology;  Laterality: N/A;   FISSURECTOMY     IRRIGATION AND DEBRIDEMENT FOOT Left 12/16/2023   Procedure: IRRIGATION AND DEBRIDEMENT FOOT, possible sesamoidectomy, bone biopsy;  Surgeon: Lamount Ethan CROME, DPM;  Location: [facility] ORS;  Service: Podiatry;  Laterality: Left;  Pulse lavage, bone biopsy with jam shidi needle, mini c arm, sagittal saw available   Right transmet amputation Right    TRANSMETATARSAL AMPUTATION Left 01/03/2024   Procedure: AMPUTATION, FOOT, TRANSMETATARSAL;  Surgeon: Malvin Marsa FALCON, DPM;  Location: [facility] ORS;  Service: Orthopedics/Podiatry;  Laterality: Left;  Left foot Trans met amputation     MEDICATIONS:  Prior to Admission medications   Medication Sig Start Date End Date Taking? Authorizing Provider  carvedilol (COREG) 3.125 MG tablet Take 3.125 mg by mouth.    [provider]  ferrous sulfate (FER-IN-SOL) 75 (15 Fe) MG/ML SOLN Take 325 mg by mouth.    [provider]  folic acid  (FOLVITE ) 1 MG tablet Take 1 tablet (1 mg total) by mouth daily.  01/23/24   [name], MD  lactulose  (CHRONULAC ) 10 GM/15ML solution Take 30 mLs (20 g total) by mouth daily. Patient not taking: Reported on 03/26/2024 01/24/24   [name], MD  Multiple Vitamin (MULTIVITAMIN WITH MINERALS) TABS tablet Take 1 tablet by mouth daily. Patient not taking: Reported on 03/26/2024  01/23/24   [name], MD  omeprazole  (PRILOSEC) 20 MG capsule Take 1 capsule (20 mg total) by mouth 2 (two) times daily before a meal. 06/05/24 12/02/24  Vanga, [name], MD  polyethylene glycol (MIRALAX  / GLYCOLAX ) 17 g packet Take 17 g by mouth daily. Patient not taking: Reported on 03/26/2024 02/24/24   Dorinda Drue DASEN, MD  pregabalin  (LYRICA ) 200 MG capsule Take 1 capsule (200 mg total) by mouth 2 (two) times daily. 02/23/24   Dorinda Drue DASEN, MD  Sofosbuvir-Velpatasvir 400-100 MG TABS Take by mouth. Patient not taking: Reported on 06/05/2024    [provider]  thiamine  (VITAMIN B1) 100 MG tablet Take 1 tablet (100 mg total) by mouth daily. 01/23/24   [name], MD     ALLERGIES:  No Known Allergies   SOCIAL HISTORY:  Social History   Socioeconomic History   Marital status: Single    Spouse name: Not on file   Number of children: Not on file   Years of education: Not on file   Highest education level: Not on file  Occupational History   Not on file  Tobacco Use   Smoking status: Some Days    Current packs/day: 0.25    Types: Cigarettes   Smokeless tobacco: Never  Vaping Use   Vaping status: Never Used  Substance and Sexual Activity   Alcohol use: Not Currently    Comment: since 10/03/17   Drug use: Yes    Types: Marijuana    Comment: 12/21/17   Sexual activity: Yes  Other Topics Concern   Not on file  Social History Narrative   Not on file   Social Drivers of Health   Financial Resource Strain: Medium Risk (03/28/2024)   Received from [facility]   Overall Financial Resource Strain (CARDIA)    Difficulty of Paying Living Expenses: Somewhat hard  Food Insecurity: Food Insecurity Present (03/28/2024)   Received from [facility]   Hunger Vital Sign    Within the past 12 months, you worried that your food would run out before you got the money to buy more.: Sometimes true    Within the past 12 months, the  food you bought just didn't last and you didn't have money to get more.: Sometimes true  Transportation Needs: No Transportation Needs (03/11/2024)   Received from [facility] - Transportation    In the past 12 months, has lack of transportation kept you from medical appointments or from getting medications?: No    Lack of Transportation (Non-Medical): No  Recent Concern: Transportation Needs - Unmet Transportation Needs (02/12/2024)   PRAPARE - Administrator, Civil Service (Medical): Yes    Lack of Transportation (Non-Medical): No  Physical Activity: Not on file  Stress: Not on file  Social Connections: Unknown (01/15/2024)   Social Connection and Isolation Panel    Frequency of Communication with Friends and Family: Patient declined    Frequency of Social Gatherings with Friends and Family: Patient declined    Attends Religious Services: Patient declined    Database administrator or Organizations: Patient declined  Attends Banker Meetings: Patient declined    Marital Status: Never married  Intimate Partner Violence: Not At Risk (02/12/2024)   Humiliation, Afraid, Rape, and Kick questionnaire    Fear of Current or Ex-Partner: No    Emotionally Abused: No    Physically Abused: No    Sexually Abused: No     FAMILY HISTORY:  Family History  Problem Relation Age of Onset   Lung cancer Mother    Cirrhosis Father    Lung cancer Paternal Grandfather       REVIEW OF SYSTEMS:  Review of Systems  Constitutional:  Negative for chills and fever.  Respiratory:  Negative for cough and shortness of breath.   Cardiovascular:  Negative for chest pain and palpitations.  Gastrointestinal:  Positive for abdominal pain, diarrhea, nausea and vomiting. Negative for blood in stool and constipation.  Genitourinary:  Negative for dysuria and urgency.  All other systems reviewed and are negative.   VITAL SIGNS:  Temp:  [97.6 F (36.4 C)-98.6 F  (37 C)] 97.6 F (36.4 C) (10/13 0606) Pulse Rate:  [65-89] 75 (10/13 0606) Resp:  [15-22] 17 (10/13 0606) BP: (99-125)/(61-81) 104/78 (10/13 0606) SpO2:  [90 %-98 %] 94 % (10/13 0606) Weight:  [91.6 kg] 91.6 kg (10/12 1736)     Height: 5' 9 (175.3 cm) Weight: 91.6 kg BMI (Calculated): 29.82   INTAKE/OUTPUT:  10/12 0701 - 10/13 0700 In: 550 [IV Piggyback:550] Out: -   PHYSICAL EXAM:  Physical Exam Vitals and nursing note reviewed. Exam conducted with a chaperone present.  Constitutional:      General: He is not in acute distress.    Appearance: He is not ill-appearing.  HENT:     Head: Normocephalic and atraumatic.  Eyes:     General: No scleral icterus.    Extraocular Movements: Extraocular movements intact.  Cardiovascular:     Rate and Rhythm: Normal rate.     Heart sounds: Normal heart sounds. No murmur heard. Pulmonary:     Effort: Pulmonary effort is normal. No respiratory distress.  Abdominal:     General: Abdomen is flat. There is no distension.     Palpations: Abdomen is soft.     Tenderness: There is abdominal tenderness in the right upper quadrant. There is no guarding or rebound.     Comments: Multiple abdominal varices present   Genitourinary:    Comments: Deferred Musculoskeletal:     Comments: Left BKA Right metatarsal amputation   Skin:    General: Skin is warm and dry.     Coloration: Skin is not jaundiced.  Neurological:     General: No focal deficit present.     Mental Status: He is alert and oriented to person, place, and time.  Psychiatric:        Mood and Affect: Mood normal.        Behavior: Behavior normal.      Labs:     Latest Ref Rng & Units 06/17/2024    3:17 AM 06/16/2024    6:10 PM 02/23/2024    4:42 AM  CBC  WBC 4.0 - 10.5 K/uL 4.1  5.7  3.3   Hemoglobin 13.0 - 17.0 g/dL 87.6  86.0  7.9   Hematocrit 39.0 - 52.0 % 37.1  40.3  25.3   Platelets 150 - 400 K/uL 46  59  50       Latest Ref Rng & Units 06/17/2024    3:17 AM  06/16/2024  6:10 PM 02/23/2024    4:42 AM  CMP  Glucose 70 - 99 mg/dL 91  898  885   BUN 6 - 20 mg/dL 8  8  9    Creatinine 0.61 - 1.24 mg/dL 9.43  9.57  9.51   Sodium 135 - 145 mmol/L 144  145  142   Potassium 3.5 - 5.1 mmol/L 3.5  3.5  3.6   Chloride 98 - 111 mmol/L 113  111  113   CO2 22 - 32 mmol/L 20  22  22    Calcium  8.9 - 10.3 mg/dL 8.9  9.2  8.8   Total Protein 6.5 - 8.1 g/dL 6.7  7.5    Total Bilirubin 0.0 - 1.2 mg/dL 2.3  2.6    Alkaline Phos 38 - 126 U/L 138  159    AST 15 - 41 U/L 44  52    ALT 0 - 44 U/L 18  23       Imaging studies:   RUQ US  (06/16/2024) personally reviewed with contraction of the GB, stones noted, and radiologist report reviewed below:  IMPRESSION: Cholelithiasis. Gallbladder is contracted with mild gallbladder wall thickening. This could be related to the contracted state, liver disease or chronic cholecystitis.   CT Abdomen/Pelvis (06/16/2024) personally reviewed with contraction of the GB, changes consistent with liver disease, and radiologist report reviewed below;  IMPRESSION: Changes consistent with cirrhosis of the liver with portal hypertension and recanalization of the umbilical vein. Associated splenomegaly is noted as well.   Extensive esophageal varices.   Cholelithiasis without complicating factors.   No acute abnormality noted.   Assessment/Plan: (ICD-10's: K33.20) 43 y.o. male with abdominal pain found to have cholelithiasis, complicated by pertinent comorbidities including known history of cirrhosis.   - Appreciate medicine admission - His work up to the point has been equivocal of cholecystitis. I suspect that the changes of his gallbladder and his hyperbilirubinemia are sequelae of his chronic liver disease and less likely acute cholecystitis. However, we will get HIDA to definitively evaluate cystic duct patency. If this is concerning for cholecystitis he will ne be a candidate for surgical intervention given his  cirrhosis. 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 I also do not think his gallbladder will be amenable to percutaneous cholecystostomy given his contracted gallbladder.   - For now, continue NPO awaiting HIDA  - IVF support  - Monitor abdominal examination  - Pain control prn; antiemetics prn   - Further management per primary service; we will follow    All of the above findings and recommendations were discussed with the patient, and all of patient's questions were answered to his expressed satisfaction.  Thank you for the opportunity to participate in this patient's care.   -- Arthea Platt, PA-C [facility] 06/17/2024, 7:32 AM M-F: 7am - 4pm

## 2024-06-17 NOTE — Assessment & Plan Note (Signed)
 History of untreated hepatitis C, likely contributed to liver cirrhosis. -Outpatient follow-up with infectious disease

## 2024-06-17 NOTE — ED Notes (Signed)
   06/17/24 0000  Infiltration/Extravasation/Phlebitis Assessment  Site left lower forearm  Date Pharmacy Contacted 06/17/24  Time Pharmacy Contacted [phone] (Spoke with Selinda)  Infiltration/Extravasation Grading Scale 1  Phlebitis Grading Scale 0   Spoke with Selinda at pharmacy. No protocol for IV Zosyn  infiltration. Elevate with warm compress.

## 2024-06-17 NOTE — Progress Notes (Signed)
 Progress Note   Patient: Derrick Blanchard FMW:[id number] DOB: 12-Feb-1981 DOA: 06/16/2024     1 DOS: the patient was seen and examined on 06/17/2024   Brief hospital course: Partly taken from H&P.  Derrick Blanchard is a 43 y.o. male with medical history significant for alcoholism, untreated hepatitis C, cirrhosis, hypertension, history of osteomyelitis s/p left leg amputation, who presents to the ER with complaints of right upper quadrant abdominal pain associated with watery diarrhea and stool incontinence for the past 2 days.  Admits to chills.   On presentation stable vital, labs with chronic thrombocytopenia, T. bili at 2.6 with INR of 1.5, ethanol at 186, normal ammonia and lipase. CT abdomen and pelvis consistent with cirrhosis of liver, portal hypertension and splenomegaly.  Extensive esophageal varices and cholelithiasis without complicating factors. RUQ ultrasound with cholelithiasis and mild gallbladder wall thickening which could be related to contracted state, liver disease or chronic cholecystitis.  Patient was started on Zosyn , C. difficile and GI pathogen panel ordered.  10/13: Vital stable, slight worsening of thrombocytopenia with platelets at 46.  T. bili at 2.3. General surgery ordered HIDA scan.  No diarrhea or bowel movement since in the hospital.  Assessment and Plan: * Cholecystitis Imaging with some concern of cholelithiasis but no definitive sign of infection. General surgery was consulted and they ordered HIDA scan. - Continue with Zosyn  - Continue with supportive care  Diarrhea Patient with some concern of diarrhea and was not taking lactulose .  No bowel movement since in the hospital. - C. difficile and GI pathogen panel was ordered but unlikely any cause as diarrhea has been resolved. - Will resume lactulose  if there is still no more bowel movement till tomorrow  Liver cirrhosis, alcoholic (HCC) Mild transaminitis with underlying liver  cirrhosis. -Continue to monitor -Will resume home lactulose  from tomorrow if there is still no more diarrhea  Severe alcohol use disorder (HCC) Counseling was provided. - Continue with CIWA protocol  Chronic idiopathic thrombocytopenia (HCC) Likely secondary to liver cirrhosis and alcohol abuse.  Platelets of 46 today with no obvious bleeding. - Continue to monitor  Hepatitis C History of untreated hepatitis C, likely contributed to liver cirrhosis. -Outpatient follow-up with infectious disease   Subjective: Patient continued to have significant right upper quadrant pain radiating to back.  Did not had any bowel movement since in the hospital, he was feeling hungry and asking for food.  Mild nausea but no vomiting.  Physical Exam: Vitals:   06/17/24 0100 06/17/24 0218 06/17/24 0606 06/17/24 0747  BP: 117/66 115/81 104/78 121/74  Pulse: 89 69 75 66  Resp: (!) 21 20 17 17   Temp:  98.5 F (36.9 C) 97.6 F (36.4 C) 98.6 F (37 C)  TempSrc:    Oral  SpO2: 93% 98% 94% 95%  Weight:      Height:       General.  Overweight gentleman, in no acute distress. Pulmonary.  Lungs clear bilaterally, normal respiratory effort. CV.  Regular rate and rhythm, no JVD, rub or murmur. Abdomen.  Soft, nontender, nondistended, BS positive. CNS.  Alert and oriented .  No focal neurologic deficit. Extremities.  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  Judgment and insight appears normal.   Data Reviewed: Prior data reviewed  Family Communication: Discussed with patient  Disposition: Status is: Inpatient Remains inpatient appropriate because: Severity of illness  Planned Discharge Destination: Home  Time spent: 50 minutes  This record has been created using Conservation officer, historic buildings.  Errors have been sought and corrected,but may not always be located. Such creation errors do not reflect on the standard of care.   Author: Amaryllis Dare, MD 06/17/2024 2:38 PM  For on call review  [email or web address].

## 2024-06-17 NOTE — Hospital Course (Addendum)
 Partly taken from H&P.  Derrick Blanchard is a 43 y.o. male with medical history significant for alcoholism, untreated hepatitis C, cirrhosis, hypertension, history of osteomyelitis s/p left leg amputation, who presents to the ER with complaints of right upper quadrant abdominal pain associated with watery diarrhea and stool incontinence for the past 2 days.  Admits to chills.   On presentation stable vital, labs with chronic thrombocytopenia, T. bili at 2.6 with INR of 1.5, ethanol at 186, normal ammonia and lipase. CT abdomen and pelvis consistent with cirrhosis of liver, portal hypertension and splenomegaly.  Extensive esophageal varices and cholelithiasis without complicating factors. RUQ ultrasound with cholelithiasis and mild gallbladder wall thickening which could be related to contracted state, liver disease or chronic cholecystitis.  Patient was started on Zosyn , C. difficile and GI pathogen panel ordered.  10/13: Vital stable, slight worsening of thrombocytopenia with platelets at 46.  T. bili at 2.3. General surgery ordered HIDA scan.  No diarrhea or bowel movement since in the hospital.  10/14: Vital stable, mild hypokalemia, seems like worsening pancytopenia with WBC at 1.9 and platelets at 30.  T. bili at 3.5 with elevated indirect bilirubin, LDH normal at 128, smear looks normal, INR 1.5.  No active bleeding, hematology was also consulted.  HIDA scan with delayed gallbladder filling consistent with patent cystic duct and no evidence of cholecystitis.  Surgery is recommending continuation of antibiotics, not a surgical candidate.  Diet advanced to full liquid

## 2024-06-17 NOTE — Assessment & Plan Note (Signed)
 Patient with some concern of diarrhea and was not taking lactulose .  No bowel movement since in the hospital. - C. difficile and GI pathogen panel was ordered but unlikely any cause as diarrhea has been resolved. - Will resume lactulose  if there is still no more bowel movement till tomorrow

## 2024-06-18 ENCOUNTER — Ambulatory Visit

## 2024-06-18 DIAGNOSIS — R197 Diarrhea, unspecified: Secondary | ICD-10-CM | POA: Diagnosis not present

## 2024-06-18 DIAGNOSIS — K7030 Alcoholic cirrhosis of liver without ascites: Secondary | ICD-10-CM | POA: Diagnosis not present

## 2024-06-18 DIAGNOSIS — F1020 Alcohol dependence, uncomplicated: Secondary | ICD-10-CM | POA: Diagnosis not present

## 2024-06-18 DIAGNOSIS — K819 Cholecystitis, unspecified: Secondary | ICD-10-CM | POA: Diagnosis not present

## 2024-06-18 LAB — CBC WITH DIFFERENTIAL/PLATELET
Abs Immature Granulocytes: 0 K/uL (ref 0.00–0.07)
Band Neutrophils: 0 %
Basophils Absolute: 0 K/uL (ref 0.0–0.1)
Basophils Relative: 1 %
Blasts: 0 %
Eosinophils Absolute: 0 K/uL (ref 0.0–0.5)
Eosinophils Relative: 2 %
HCT: 32.3 % — ABNORMAL LOW (ref 39.0–52.0)
Hemoglobin: 11.3 g/dL — ABNORMAL LOW (ref 13.0–17.0)
Immature Granulocytes: 0 %
Lymphocytes Relative: 25 %
Lymphs Abs: 0.5 K/uL — ABNORMAL LOW (ref 0.7–4.0)
MCH: 29 pg (ref 26.0–34.0)
MCHC: 35 g/dL (ref 30.0–36.0)
MCV: 82.8 fL (ref 80.0–100.0)
Metamyelocytes Relative: 0 %
Monocytes Absolute: 0.2 K/uL (ref 0.1–1.0)
Monocytes Relative: 10 %
Myelocytes: 0 %
Neutro Abs: 1.2 K/uL — ABNORMAL LOW (ref 1.7–7.7)
Neutrophils Relative %: 62 %
Other: 0 %
Platelets: 29 K/uL — CL (ref 150–400)
Promyelocytes Relative: 0 %
RBC Morphology: 0
RBC: 3.9 MIL/uL — ABNORMAL LOW (ref 4.22–5.81)
RDW: 14.5 % (ref 11.5–15.5)
Smear Review: 0
WBC Morphology: 0
WBC: 1.8 K/uL — ABNORMAL LOW (ref 4.0–10.5)
nRBC: 0 % (ref 0.0–0.2)
nRBC: 0 /100{WBCs}

## 2024-06-18 LAB — CBC
HCT: 32.4 % — ABNORMAL LOW (ref 39.0–52.0)
Hemoglobin: 11.5 g/dL — ABNORMAL LOW (ref 13.0–17.0)
MCH: 29.6 pg (ref 26.0–34.0)
MCHC: 35.5 g/dL (ref 30.0–36.0)
MCV: 83.5 fL (ref 80.0–100.0)
Platelets: 30 K/uL — ABNORMAL LOW (ref 150–400)
RBC: 3.88 MIL/uL — ABNORMAL LOW (ref 4.22–5.81)
RDW: 14.6 % (ref 11.5–15.5)
WBC: 1.9 K/uL — ABNORMAL LOW (ref 4.0–10.5)
nRBC: 0 % (ref 0.0–0.2)

## 2024-06-18 LAB — COMPREHENSIVE METABOLIC PANEL WITH GFR
ALT: 17 U/L (ref 0–44)
AST: 39 U/L (ref 15–41)
Albumin: 3.2 g/dL — ABNORMAL LOW (ref 3.5–5.0)
Alkaline Phosphatase: 108 U/L (ref 38–126)
Anion gap: 7 (ref 5–15)
BUN: 10 mg/dL (ref 6–20)
CO2: 23 mmol/L (ref 22–32)
Calcium: 8.5 mg/dL — ABNORMAL LOW (ref 8.9–10.3)
Chloride: 109 mmol/L (ref 98–111)
Creatinine, Ser: 0.52 mg/dL — ABNORMAL LOW (ref 0.61–1.24)
GFR, Estimated: >60 mL/min (ref >60)
Glucose, Bld: 106 mg/dL — ABNORMAL HIGH (ref 70–99)
Potassium: 3.3 mmol/L — ABNORMAL LOW (ref 3.5–5.1)
Sodium: 139 mmol/L (ref 135–145)
Total Bilirubin: 3.5 mg/dL — ABNORMAL HIGH (ref 0.0–1.2)
Total Protein: 6 g/dL — ABNORMAL LOW (ref 6.5–8.1)

## 2024-06-18 LAB — TECHNOLOGIST SMEAR REVIEW
Clinical Information: 0
Plt Morphology: DECREASED

## 2024-06-18 LAB — BILIRUBIN, FRACTIONATED(TOT/DIR/INDIR)
Bilirubin, Direct: 0.8 mg/dL — ABNORMAL HIGH (ref 0.0–0.2)
Indirect Bilirubin: 2.4 mg/dL — ABNORMAL HIGH (ref 0.3–0.9)
Total Bilirubin: 3.2 mg/dL — ABNORMAL HIGH (ref 0.0–1.2)

## 2024-06-18 LAB — FIBRINOGEN: Fibrinogen: 172 mg/dL — ABNORMAL LOW (ref 210–475)

## 2024-06-18 LAB — PROTIME-INR
INR: 1.5 — ABNORMAL HIGH (ref 0.8–1.2)
Prothrombin Time: 19.4 s — ABNORMAL HIGH (ref 11.4–15.2)

## 2024-06-18 LAB — BILIRUBIN, DIRECT: Bilirubin, Direct: 0.8 mg/dL — ABNORMAL HIGH (ref 0.0–0.2)

## 2024-06-18 LAB — LACTATE DEHYDROGENASE: LDH: 128 U/L (ref 98–192)

## 2024-06-18 MED ORDER — SODIUM CHLORIDE 0.9 % IV SOLN
3.0000 g | Freq: Four times a day (QID) | INTRAVENOUS | Status: DC
  Administered 2024-06-18 – 2024-06-22 (×16): 3 g via INTRAVENOUS
  Filled 2024-06-18 (×17): qty 8

## 2024-06-18 MED ORDER — OXYCODONE HCL 5 MG PO TABS
5.0000 mg | ORAL_TABLET | ORAL | Status: DC | PRN
Start: 2024-06-18 — End: 2024-06-19
  Administered 2024-06-18 – 2024-06-19 (×4): 5 mg via ORAL
  Filled 2024-06-18 (×4): qty 1

## 2024-06-18 NOTE — Assessment & Plan Note (Signed)
 Likely secondary to liver cirrhosis and alcohol abuse.  Worsening platelets and now leukopenia at 1.9, platelets of 30 today Smear with normal morphology Elevated T. bili, mostly indirect, LDH normal -Hematology was consulted -Monitor for any abnormal bleeding -Platelet transfusion might be needed if continue to decrease and less than 20

## 2024-06-18 NOTE — Assessment & Plan Note (Signed)
 Patient with some concern of diarrhea and was not taking lactulose .  No bowel movement since in the hospital. - C. difficile and GI pathogen panel was ordered but unlikely any cause as diarrhea has been resolved. - Will resume lactulose  if there is still no more bowel movement till tomorrow

## 2024-06-18 NOTE — Progress Notes (Signed)
 Progress Note   Patient: Derrick Blanchard FMW:[id number] DOB: [birth year] DOA: 06/16/2024     2 DOS: the patient was seen and examined on 06/18/2024   Brief hospital course: Partly taken from H&P.  Derrick Blanchard is a 43 y.o. male with medical history significant for alcoholism, untreated hepatitis C, cirrhosis, hypertension, history of osteomyelitis s/p left leg amputation, who presents to the ER with complaints of right upper quadrant abdominal pain associated with watery diarrhea and stool incontinence for the past 2 days.  Admits to chills.   On presentation stable vital, labs with chronic thrombocytopenia, T. bili at 2.6 with INR of 1.5, ethanol at 186, normal ammonia and lipase. CT abdomen and pelvis consistent with cirrhosis of liver, portal hypertension and splenomegaly.  Extensive esophageal varices and cholelithiasis without complicating factors. RUQ ultrasound with cholelithiasis and mild gallbladder wall thickening which could be related to contracted state, liver disease or chronic cholecystitis.  Patient was started on Zosyn , C. difficile and GI pathogen panel ordered.  10/13: Vital stable, slight worsening of thrombocytopenia with platelets at 46.  T. bili at 2.3. General surgery ordered HIDA scan.  No diarrhea or bowel movement since in the hospital.  10/14: Vital stable, mild hypokalemia, seems like worsening pancytopenia with WBC at 1.9 and platelets at 30.  T. bili at 3.5 with elevated indirect bilirubin, LDH normal at 128, smear looks normal, INR 1.5.  No active bleeding, hematology was also consulted.  HIDA scan with delayed gallbladder filling consistent with patent cystic duct and no evidence of cholecystitis.  Surgery is recommending continuation of antibiotics, not a surgical candidate.  Diet advanced to full liquid  Assessment and Plan: * Cholecystitis Imaging with some concern of cholelithiasis but no definitive sign of infection. HIDA scan negative for  cholecystitis and patent cystic duct.  Surgery is recommending continuation of antibiotics and he is not a surgical candidate. Slowly improving symptoms - Continue with Zosyn  - Continue with supportive care  Diarrhea Patient with some concern of diarrhea and was not taking lactulose .  No bowel movement since in the hospital. - C. difficile and GI pathogen panel was ordered but unlikely any cause as diarrhea has been resolved. - Will resume lactulose  if there is still no more bowel movement till tomorrow  Liver cirrhosis, alcoholic (HCC) Mild transaminitis with underlying liver cirrhosis. -Continue to monitor - Resuming lactulose  as there is no more diarrhea  Severe alcohol use disorder (HCC) Counseling was provided. - Continue with CIWA protocol  Chronic idiopathic thrombocytopenia (HCC) Likely secondary to liver cirrhosis and alcohol abuse.  Worsening platelets and now leukopenia at 1.9, platelets of 30 today Smear with normal morphology Elevated T. bili, mostly indirect, LDH normal -Hematology was consulted -Monitor for any abnormal bleeding -Platelet transfusion might be needed if continue to decrease and less than 20  Hepatitis C History of untreated hepatitis C, likely contributed to liver cirrhosis. -Outpatient follow-up with infectious disease   Subjective: Patient was still having right upper quadrant pain but stating that it is improving.  No more nausea or vomiting.  No more diarrhea.  Asking advancement in diet  Physical Exam: Vitals:   06/17/24 0747 06/18/24 0559 06/18/24 0728 06/18/24 1004  BP: 121/74 106/60 (!) 105/52 112/64  Pulse: 66 63 (!) 59 75  Resp: 17 18 17    Temp: 98.6 F (37 C) 98.3 F (36.8 C) 98.5 F (36.9 C)   TempSrc: Oral     SpO2: 95% 97% 98%   Weight:  Height:       General.  Ill-appearing gentleman, in no acute distress. Pulmonary.  Lungs clear bilaterally, normal respiratory effort. CV.  Regular rate and rhythm, no JVD, rub or  murmur. Abdomen.  Soft, mild RUQ tenderness, nondistended, BS positive. CNS.  Alert and oriented .  No focal neurologic deficit. Extremities.  No edema,  pulses intact and symmetrical. Psychiatry.  Judgment and insight appears normal.   Data Reviewed: Prior data reviewed  Family Communication: Discussed with patient  Disposition: Status is: Inpatient Remains inpatient appropriate because: Severity of illness  Planned Discharge Destination: Home  Time spent: 50 minutes  This record has been created using Conservation officer, historic buildings. Errors have been sought and corrected,but may not always be located. Such creation errors do not reflect on the standard of care.   Author: Amaryllis Dare, MD 06/18/2024 2:25 PM  For on call review [email or web address].

## 2024-06-18 NOTE — Assessment & Plan Note (Signed)
 Imaging with some concern of cholelithiasis but no definitive sign of infection. HIDA scan negative for cholecystitis and patent cystic duct.  Surgery is recommending continuation of antibiotics and he is not a surgical candidate. Slowly improving symptoms - Continue with Zosyn  - Continue with supportive care

## 2024-06-18 NOTE — Assessment & Plan Note (Addendum)
 Mild transaminitis with underlying liver cirrhosis. -Continue to monitor - Resuming lactulose  as there is no more diarrhea

## 2024-06-18 NOTE — Plan of Care (Signed)

## 2024-06-18 NOTE — Progress Notes (Signed)
 Date and time results received: 06/18/24 1133   Test: platelet  Critical Value: 29  Name of Provider Notified: Dr. Amaryllis Dare  Orders Received? Or Actions Taken?: no new orders at this time

## 2024-06-18 NOTE — Consult Note (Signed)
 Hematology/Oncology Consult note [facility] [phone] [phone]  Patient Care Team: Patient, No Pcp Per as PCP - General (General Practice)   Name of the patient: Derrick Blanchard  [id number]  06-08-1981    Reason for consult-pancytopenia   Requesting physician: Dr. Caleen  Date of visit: 06/18/2024   History of presenting illness-patient is a 43 year old male with a past medical history significant for untreated hepatitis C and liver cirrhosis, hypertension, history of osteomyelitis status post left leg amputation who presented to the hospital with watery diarrhea and chills.  He has been presently treated with Zosyn  for possible cholecystitis.  He is not deemed to be a surgical candidate for his cholecystitis.  Hematology consulted for pancytopenia.  At baseline patient's platelet counts are between 50s to 60s.  White cell count typically runs between 3-4 but during his present hospitalization his white cell count is 1.8 with a platelet count of 29.  Hemoglobin 11.3.  Patient is drowsy at the time of my visit today and does not elaborate on much she is  ECOG PS- 3     Review of systems- Review of Systems  Constitutional:  Positive for malaise/fatigue. Negative for chills, fever and weight loss.  HENT:  Negative for congestion, ear discharge and nosebleeds.   Eyes:  Negative for blurred vision.  Respiratory:  Negative for cough, hemoptysis, sputum production, shortness of breath and wheezing.   Cardiovascular:  Negative for chest pain, palpitations, orthopnea and claudication.  Gastrointestinal:  Negative for abdominal pain, blood in stool, constipation, diarrhea, heartburn, melena, nausea and vomiting.  Genitourinary:  Negative for dysuria, flank pain, frequency, hematuria and urgency.  Musculoskeletal:  Negative for back pain, joint pain and myalgias.  Skin:  Negative for rash.  Neurological:  Negative for dizziness, tingling,  focal weakness, seizures, weakness and headaches.  Endo/Heme/Allergies:  Does not bruise/bleed easily.  Psychiatric/Behavioral:  Negative for depression and suicidal ideas. The patient does not have insomnia.     No Known Allergies  Patient Active Problem List   Diagnosis Date Noted   Chronic idiopathic thrombocytopenia (HCC) 06/17/2024   Cholecystitis 06/16/2024   Acute blood loss anemia 02/13/2024   Obesity (BMI 30-39.9) 02/13/2024   Lactic acidosis 02/12/2024   Hypernatremia 02/12/2024   Wound infection 02/11/2024   Generalized weakness 01/16/2024   Wound dehiscence 01/15/2024   Left leg pain 01/15/2024   Complication of foot amputation stump (HCC) 01/06/2024   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) 01/03/2024   Infection of toe as complication of amputation (HCC) 12/27/2023   Wound of left foot 12/13/2023   Open wound of left lower extremity 12/13/2023   Severe alcohol use disorder (HCC) 12/13/2023   Osteomyelitis (HCC) 12/13/2023   Hypomagnesemia 03/13/2023   Diarrhea 03/12/2023   Severe sepsis (HCC) 03/11/2023   Liver cirrhosis, alcoholic (HCC) 03/11/2023   Hepatitis C 03/11/2023   Cellulitis of left lower extremity 03/11/2023   Ulcer of left foot with muscle involvement without evidence of necrosis (HCC) 03/11/2023   Nausea and vomiting 03/11/2023   Portal hypertension with esophageal varices (HCC) 11/27/2019   Thrombocytopenia, acquired 11/27/2019   GERD (gastroesophageal reflux disease) 11/24/2017   Tachycardia 11/24/2017   Hypokalemia 10/25/2017   Thrombocytopenia 10/25/2017   Advance care planning 10/25/2017   Depression, major, single episode, moderate (HCC) 10/25/2017   Elevated liver enzymes 09/28/2017   Hyperbilirubinemia    Alcohol use disorder, severe, dependence (HCC) 02/03/2015   Alcoholic hepatitis (HCC) 01/30/2015   Hematochezia  01/30/2015   Coagulopathy 01/30/2015   Hyponatremia 01/30/2015     Past Medical History:   Diagnosis Date   AKI (acute kidney injury)    Anemia    Anxiety    Cirrhosis (HCC)    Depression    Heart murmur    Hepatitis    Hypertension    Severe alcohol use disorder (HCC)    Substance abuse (HCC)    Tachycardia      Past Surgical History:  Procedure Laterality Date   AMPUTATION Left 12/29/2023   Procedure: AMPUTATION, FOOT, RAY;  Surgeon: Malvin Marsa FALCON, DPM;  Location: [facility] ORS;  Service: Orthopedics/Podiatry;  Laterality: Left;  Partial first ray amp, abx beads, graft application   AMPUTATION Left 02/12/2024   Procedure: AMPUTATION BELOW KNEE;  Surgeon: Marea Selinda RAMAN, MD;  Location: [facility];  Service: General;  Laterality: Left;   COLONOSCOPY     ESOPHAGOGASTRODUODENOSCOPY N/A 06/05/2024   Procedure: EGD (ESOPHAGOGASTRODUODENOSCOPY);  Surgeon: Unk [name], MD;  Location: [facility];  Service: Gastroenterology;  Laterality: N/A;   ESOPHAGOGASTRODUODENOSCOPY (EGD) WITH PROPOFOL  N/A 02/03/2015   Procedure: ESOPHAGOGASTRODUODENOSCOPY (EGD) WITH PROPOFOL ;  Surgeon: Donnice Vaughn Manes, MD;  Location: [facility] ENDOSCOPY;  Service: Endoscopy;  Laterality: N/A;   ESOPHAGOGASTRODUODENOSCOPY (EGD) WITH PROPOFOL  N/A 12/21/2017   Procedure: ESOPHAGOGASTRODUODENOSCOPY (EGD) WITH PROPOFOL ;  Surgeon: Therisa Bi, MD;  Location: [facility] END[facility]COPY;  Service: Gastroenterology;  Laterality: N/A;   FISSURECTOMY     IRRIGATION AND DEBRIDEMENT FOOT Left 12/16/2023   Procedure: IRRIGATION AND DEBRIDEMENT FOOT, possible sesamoidectomy, bone biopsy;  Surgeon: Lamount Ethan CROME, DPM;  Location: [facility];  Service: Podiatry;  Laterality: Left;  Pulse lavage, bone biopsy with jam shidi needle, mini c arm, sagittal saw available   Right transmet amputation Right    TRANSMETATARSAL AMPUTATION Left 01/03/2024   Procedure: AMPUTATION, FOOT, TRANSMETATARSAL;  Surgeon: Malvin Marsa FALCON, DPM;  Location: [facility];  Service: Orthopedics/Podiatry;  Laterality: Left;  Left foot Trans met  amputation    Social History   Socioeconomic History   Marital status: Single    Spouse name: Not on file   Number of children: Not on file   Years of education: Not on file   Highest education level: Not on file  Occupational History   Not on file  Tobacco Use   Smoking status: Some Days    Current packs/day: 0.25    Types: Cigarettes   Smokeless tobacco: Never  Vaping Use   Vaping status: Never Used  Substance and Sexual Activity   Alcohol use: Not Currently    Comment: since 10/03/17   Drug use: Yes    Types: Marijuana    Comment: 12/21/17   Sexual activity: Yes  Other Topics Concern   Not on file  Social History Narrative   Not on file   Social Drivers of Health   Financial Resource Strain: Medium Risk (03/28/2024)   Received from [facility]   Overall Financial Resource Strain (CARDIA)    Difficulty of Paying Living Expenses: Somewhat hard  Food Insecurity: Food Insecurity Present (06/17/2024)   Hunger Vital Sign    Worried About Running Out of Food in the Last Year: Often true    Ran Out of Food in the Last Year: Often true  Transportation Needs: Unmet Transportation Needs (06/17/2024)   PRAPARE - Administrator, Civil Service (Medical): Yes    Lack of Transportation (Non-Medical): No  Physical Activity: Not on file  Stress: Not on file  Social Connections: Unknown (01/15/2024)   Social Connection and Isolation Panel    Frequency of Communication with Friends and Family: Patient declined    Frequency of Social Gatherings with Friends and Family: Patient declined    Attends Religious Services: Patient declined    Database administrator or Organizations: Patient declined    Attends Banker Meetings: Patient declined    Marital Status: Never married  Intimate Partner Violence: Not At Risk (06/17/2024)   Humiliation, Afraid, Rape, and Kick questionnaire    Fear of Current or Ex-Partner: No    Emotionally Abused: No     Physically Abused: No    Sexually Abused: No     Family History  Problem Relation Age of Onset   Lung cancer Mother    Cirrhosis Father    Lung cancer Paternal Grandfather      Current Facility-Administered Medications:    acetaminophen  (TYLENOL ) tablet 500 mg, 500 mg, Oral, Q6H PRN, Hall, Carole N, DO   Ampicillin -Sulbactam (UNASYN ) 3 g in sodium chloride  0.9 % 100 mL IVPB, 3 g, Intravenous, Q6H, Amin, Sumayya, MD   carvedilol (COREG) tablet 3.125 mg, 3.125 mg, Oral, BID WC, Hall, Carole N, DO, 3.125 mg at 06/18/24 1004   folic acid  (FOLVITE ) tablet 1 mg, 1 mg, Oral, Daily, Hall, Carole N, DO, 1 mg at 06/18/24 1008   lactulose  (CHRONULAC ) 10 GM/15ML solution 20 g, 20 g, Oral, Daily, Amin, Sumayya, MD, 20 g at 06/18/24 1008   LORazepam  (ATIVAN ) tablet 1-4 mg, 1-4 mg, Oral, Q1H PRN **OR** LORazepam  (ATIVAN ) injection 1-4 mg, 1-4 mg, Intravenous, Q1H PRN, Amin, Sumayya, MD   melatonin tablet 5 mg, 5 mg, Oral, QHS PRN, Shona Laurence N, DO   multivitamin with minerals tablet 1 tablet, 1 tablet, Oral, Daily, Amin, Sumayya, MD, 1 tablet at 06/18/24 1007   oxyCODONE  (Oxy IR/ROXICODONE ) immediate release tablet 5 mg, 5 mg, Oral, Q4H PRN, Amin, Sumayya, MD, 5 mg at 06/18/24 1534   pantoprazole  (PROTONIX ) EC tablet 40 mg, 40 mg, Oral, Daily, Hall, Carole N, DO, 40 mg at 06/18/24 1004   pregabalin  (LYRICA ) capsule 200 mg, 200 mg, Oral, BID, Hall, Carole N, DO, 200 mg at 06/18/24 1007   prochlorperazine (COMPAZINE) injection 5 mg, 5 mg, Intravenous, Q6H PRN, Hall, Carole N, DO   thiamine  (VITAMIN B1) tablet 100 mg, 100 mg, Oral, Daily, 100 mg at 06/18/24 1004 **OR** thiamine  (VITAMIN B1) injection 100 mg, 100 mg, Intravenous, Daily, Caleen Qualia, MD   Physical exam:  Vitals:   06/18/24 0559 06/18/24 0728 06/18/24 1004 06/18/24 1541  BP: 106/60 (!) 105/52 112/64 100/64  Pulse: 63 (!) 59 75 73  Resp: 18 17  18   Temp: 98.3 F (36.8 C) 98.5 F (36.9 C)  98.3 F (36.8 C)  TempSrc:      SpO2:  97% 98%  98%  Weight:      Height:       Physical Exam Constitutional:      Comments: Patient is drowsy at the time of my visit.  Eyes:     Pupils: Pupils are equal, round, and reactive to light.  Cardiovascular:     Rate and Rhythm: Normal rate and regular rhythm.     Heart sounds: Normal heart sounds.  Pulmonary:     Effort: Pulmonary effort is normal.     Breath sounds: Normal breath sounds.  Abdominal:     General: Bowel sounds are normal.     Palpations: Abdomen is soft.  Musculoskeletal:     Cervical back: Normal range of motion.  Skin:    General: Skin is warm and dry.  Neurological:     Comments: Oriented to self           Latest Ref Rng & Units 06/18/2024   12:40 PM  CMP  Total Bilirubin 0.0 - 1.2 mg/dL 3.2       Latest Ref Rng & Units 06/18/2024    9:40 AM  CBC  WBC 4.0 - 10.5 K/uL 1.8   Hemoglobin 13.0 - 17.0 g/dL 88.6   Hematocrit 60.9 - 52.0 % 32.3   Platelets 150 - 400 K/uL 29     @IMAGES @  NM Hepatobiliary Liver Func Result Date: 06/17/2024 CLINICAL DATA:  RUQ abdominal pain, biliary disease suspected, US  nondiagnostic Cholelithiasis and gallbladder wall thickening on ultrasound. EXAM: NUCLEAR MEDICINE HEPATOBILIARY IMAGING TECHNIQUE: Sequential images of the abdomen were obtained out to 60 minutes following intravenous administration of radiopharmaceutical. RADIOPHARMACEUTICALS:  8.14 mCi Tc-42m  Choletec  IV COMPARISON:  Abdominopelvic CT and right upper quadrant abdominal ultrasound 06/16/2024. FINDINGS: Prompt uptake and biliary excretion of activity by the liver is seen. Biller activity passes into small bowel, consistent with patency of the common bile duct. There is delayed filling of the gallbladder lumen. No evidence of bile leak. IMPRESSION: Delayed gallbladder filling consistent with patency of the cystic duct. No evidence of cholecystitis. Electronically Signed   By: Elsie Perone M.D.   On: 06/17/2024 20:41   US  ABDOMEN LIMITED RUQ  (LIVER/GB) Result Date: 06/16/2024 CLINICAL DATA:  Right upper quadrant pain EXAM: ULTRASOUND ABDOMEN LIMITED RIGHT UPPER QUADRANT COMPARISON:  CT today FINDINGS: Gallbladder: Gallstones as seen on CT. Gallbladder appears contracted. Mild gallbladder wall thickening at 5 mm. Negative sonographic Murphy sign. Common bile duct: Diameter: Normal caliber, 3 mm. Liver: Mildly increased echotexture throughout the liver. Nodular contours compatible with cirrhosis. No focal hepatic abnormality. Portal vein is patent on color Doppler imaging with normal direction of blood flow towards the liver. Other: None. IMPRESSION: Cholelithiasis. Gallbladder is contracted with mild gallbladder wall thickening. This could be related to the contracted state, liver disease or chronic cholecystitis. Fatty liver, changes of cirrhosis. Electronically Signed   By: Franky Crease M.D.   On: 06/16/2024 20:55   CT ABDOMEN PELVIS W CONTRAST Result Date: 06/16/2024 CLINICAL DATA:  Right-sided abdominal pain for 2 days EXAM: CT ABDOMEN AND PELVIS WITH CONTRAST TECHNIQUE: Multidetector CT imaging of the abdomen and pelvis was performed using the standard protocol following bolus administration of intravenous contrast. RADIATION DOSE REDUCTION: This exam was performed according to the departmental dose-optimization program which includes automated exposure control, adjustment of the mA and/or kV according to patient size and/or use of iterative reconstruction technique. CONTRAST:  OMNIPAQUE  IOHEXOL  300 MG/ML  SOLN COMPARISON:  03/10/2023 FINDINGS: Lower chest: No acute abnormality. Hepatobiliary: Liver is well visualized with diffuse nodularity consistent with underlying cirrhosis. 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 This spontaneously decompresses into the right common femoral vein and to a lesser degree in the left common femoral vein. These changes are stable from the prior  exam. Gallbladder is well distended with dependent gallstones. Pancreas: Unremarkable. No pancreatic ductal dilatation or surrounding inflammatory changes. Spleen: Mild splenomegaly. Adrenals/Urinary Tract: Adrenal glands are within normal limits. Kidneys demonstrate a normal enhancement pattern bilaterally. No renal calculi are seen. No obstructive changes are noted. The bladder is well distended. Stomach/Bowel: The appendix is within normal limits. No obstructive  or inflammatory changes of the colon are seen. Stomach and small bowel are within normal limits. Extensive esophageal varices are noted. Vascular/Lymphatic: Aortic atherosclerosis. No enlarged abdominal or pelvic lymph nodes. Reproductive: Prostate is unremarkable. Other: No abdominal wall hernia or abnormality. No abdominopelvic ascites. Musculoskeletal: No acute or significant osseous findings. IMPRESSION: Changes consistent with cirrhosis of the liver with portal hypertension and recanalization of the umbilical vein. Associated splenomegaly is noted as well. Extensive esophageal varices. Cholelithiasis without complicating factors. No acute abnormality noted. Electronically Signed   By: Oneil Devonshire M.D.   On: 06/16/2024 19:10    Assessment and plan- Patient is a 43 y.o. 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  White count typically fluctuates around 3 and presently down to 1.8.  It is not unusual to see worsening pancytopenia during an acute phase of hospitalization.  Given his underlying comorbidities I do not recommend a bone marrow biopsy at this time.  His pancytopenia can be monitored conservatively and I will follow up on this a few weeks upon discharge.  If pancytopenia fails to improve over the next 1 month or so we could consider a bone marrow biopsy at  that time.  I am adding flow cytometry , b12 and folate at this time but no further workup recommended   Thank you for this kind referral and the opportunity to participate in the care of this patient   Visit Diagnosis 1. Cholecystitis     Dr. Annah Skene, MD, MPH [facility] [phone] 06/18/2024

## 2024-06-19 DIAGNOSIS — K819 Cholecystitis, unspecified: Secondary | ICD-10-CM | POA: Diagnosis not present

## 2024-06-19 LAB — C DIFFICILE QUICK SCREEN W PCR REFLEX
C Diff antigen: NEGATIVE
C Diff interpretation: NOT DETECTED
C Diff toxin: NEGATIVE

## 2024-06-19 LAB — CBC WITH DIFFERENTIAL/PLATELET
Abs Immature Granulocytes: 0.01 K/uL (ref 0.00–0.07)
Basophils Absolute: 0 K/uL (ref 0.0–0.1)
Basophils Relative: 1 %
Eosinophils Absolute: 0 K/uL (ref 0.0–0.5)
Eosinophils Relative: 2 %
HCT: 30.3 % — ABNORMAL LOW (ref 39.0–52.0)
Hemoglobin: 10.5 g/dL — ABNORMAL LOW (ref 13.0–17.0)
Immature Granulocytes: 1 %
Lymphocytes Relative: 19 %
Lymphs Abs: 0.3 K/uL — ABNORMAL LOW (ref 0.7–4.0)
MCH: 29.2 pg (ref 26.0–34.0)
MCHC: 34.7 g/dL (ref 30.0–36.0)
MCV: 84.2 fL (ref 80.0–100.0)
Monocytes Absolute: 0.2 K/uL (ref 0.1–1.0)
Monocytes Relative: 9 %
Neutro Abs: 1.2 K/uL — ABNORMAL LOW (ref 1.7–7.7)
Neutrophils Relative %: 68 %
Platelets: 28 K/uL — CL (ref 150–400)
RBC: 3.6 MIL/uL — ABNORMAL LOW (ref 4.22–5.81)
RDW: 14.6 % (ref 11.5–15.5)
WBC: 1.7 K/uL — ABNORMAL LOW (ref 4.0–10.5)
nRBC: 0 % (ref 0.0–0.2)

## 2024-06-19 LAB — COMPREHENSIVE METABOLIC PANEL WITH GFR
ALT: 15 U/L (ref 0–44)
AST: 36 U/L (ref 15–41)
Albumin: 2.9 g/dL — ABNORMAL LOW (ref 3.5–5.0)
Alkaline Phosphatase: 95 U/L (ref 38–126)
Anion gap: 6 (ref 5–15)
BUN: 6 mg/dL (ref 6–20)
CO2: 25 mmol/L (ref 22–32)
Calcium: 8.1 mg/dL — ABNORMAL LOW (ref 8.9–10.3)
Chloride: 110 mmol/L (ref 98–111)
Creatinine, Ser: 0.58 mg/dL — ABNORMAL LOW (ref 0.61–1.24)
GFR, Estimated: >60 mL/min (ref >60)
Glucose, Bld: 173 mg/dL — ABNORMAL HIGH (ref 70–99)
Potassium: 3.2 mmol/L — ABNORMAL LOW (ref 3.5–5.1)
Sodium: 141 mmol/L (ref 135–145)
Total Bilirubin: 1.9 mg/dL — ABNORMAL HIGH (ref 0.0–1.2)
Total Protein: 5.4 g/dL — ABNORMAL LOW (ref 6.5–8.1)

## 2024-06-19 LAB — GASTROINTESTINAL PANEL BY PCR, STOOL (REPLACES STOOL CULTURE)

## 2024-06-19 LAB — MAGNESIUM: Magnesium: 1.3 mg/dL — ABNORMAL LOW (ref 1.7–2.4)

## 2024-06-19 LAB — VITAMIN B12: Vitamin B-12: 672 pg/mL (ref 180–914)

## 2024-06-19 LAB — FOLATE: Folate: >20 ng/mL (ref >5.9)

## 2024-06-19 MED ORDER — OXYCODONE HCL 5 MG PO TABS: 2.5000 mg | ORAL_TABLET | ORAL | Status: DC | PRN

## 2024-06-19 MED ORDER — POTASSIUM CHLORIDE 20 MEQ PO PACK
40.0000 meq | PACK | Freq: Once | ORAL | Status: AC
  Administered 2024-06-19: 40 meq via ORAL
  Filled 2024-06-19: qty 2

## 2024-06-19 MED ORDER — MAGNESIUM SULFATE 4 GM/100ML IV SOLN
4.0000 g | Freq: Once | INTRAVENOUS | Status: AC
  Administered 2024-06-19: 4 g via INTRAVENOUS
  Filled 2024-06-19: qty 100

## 2024-06-19 MED ORDER — OXYCODONE HCL 5 MG PO TABS
5.0000 mg | ORAL_TABLET | ORAL | Status: AC | PRN
Start: 2024-06-19 — End: ?
  Administered 2024-06-19 – 2024-06-22 (×13): 5 mg via ORAL
  Filled 2024-06-19 (×13): qty 1

## 2024-06-19 NOTE — TOC CM/SW Note (Signed)
 Transition of Care [facility]) CM/SW Note    Transition of Care [facility]) - Inpatient Brief Assessment   Patient Details  Name: Derrick Blanchard MRN: [id number] Date of Birth: Jun 17, 1981  Transition of Care [facility]) CM/SW Contact:    Alvaro Louder, LCSW Phone Number: 06/19/2024, 4:37 PM   Clinical Narrative:   Per Consult Smoke Cessation and PCP information placed in AVS. No other TOC needs at this time. Please consult if they arise.   TOC to follow for discharge  Transition of Care Asessment: Insurance and Status: Insurance coverage has been reviewed Patient has primary care physician: No Home environment has been reviewed: Single family home Prior level of function:: Orineted x4 Prior/Current Home Services: No current home services Social Drivers of Health Review: SDOH reviewed needs interventions   Transition of care needs: transition of care needs identified, TOC will continue to follow

## 2024-06-19 NOTE — Discharge Instructions (Addendum)
 Some PCP options in [place] area- not a comprehensive list  [facility]- [phone] [facility]- [phone] [facility]- [phone] [facility]- [phone] [facility]- [phone] Nichole [place]- [phone]  or [facility] Physician Referral Line [phone]   Food Resources  Agency Name: [facility] Agency Address: [address] [place], [place] [address] Phone: [phone] Website: [email or web address] Service(s) Offered: Housing services, self-sufficiency, congregate meal program, weatherization program, Event organiser program, emergency food assistance,  housing counseling, home ownership program, wheels - to work program.  [employer] free for 60 and older at various locations from [employer], Monday-Friday:  [employer], [address] [place], [phone] -[facility], [address], [place] [phone]  -[facility], [address] [place] [phone]  -[address] [place], [phone]  Agency Name: [facility] on [facility] Address: [phone], [place], [place] [address] Phone: [phone] Website: [email or web address] Service(s) Offered: Home delivered hot, frozen, and emergency  meals. Grocery assistance program which matches  volunteers one-on-one with seniors unable to grocery shop  for themselves. Must be 60 years and older; less than 20  hours of in-home aide service, limited or no driving ability;  live alone or with someone with a disability; live in  [place].  Agency Name: [facility]ologist [facility]) Address: [address] [place], [place] [address] Phone: [phone] Service(s) Offered: Food is served to shut-ins, homeless, elderly, and low income people in the community every Saturday (11:30 am-12:30 pm) and Sunday (12:30 pm-1:30pm). Volunteers also offer help and encouragement  in seeking employment,  and spiritual guidance.  Agency Name: Department of Social Services Address: [address] [place], [place] [address] Phone: [phone] Service(s) Offered: Child support services; child welfare services; food stamps; Medicaid; work first family assistance; and aid with fuel,  rent, food and medicine.  Agency Name: Dietitian Address: [address] [place], [place] Phone: [phone] Website: [email or web address] Services Offered: Monday 10:00am-12:00, 8:00pm-9:00pm, and Friday 10:00am-12:00.  Agency Name: [facility] [place] Address: [address], [place], [place] [address] Phone: [phone] Website: [email or web address] Service(s) Offered: Serves weekday meals, open from 11:30 am- 1:00 pm., and 6:30-7:30pm, Monday-Wednesday-Friday distributes food 3:30-6pm, Monday-Wednesday-Friday.  Agency Name: [facility] Address: [address], [place], [place] Phone: [phone] Website: [email or web address] Services Offered: Distributes food the 4th Saturday of the month, starting at 8:00 am  Agency Name: [facility] Address: [phone] [address], [place], [place] [address] Phone: [phone] Website: [email or web address] Service(s) Offered: Bread of life, weekly food pantry. Open Wednesdays from 10:00am-noon.  Agency Name: [facility] [employer] [facility] Address: [address] [place], [place], [place] Phone: [phone] Services Offered: Distributes food 9am-1pm, Monday-Thursday. Call for details.  Agency Name: [facility] Address: [address] [place], [place] [address] Phone: [phone] Website: [email or web address] Service(s) Offered: Games developer. Call for assistance.  Agency Name: [facility] Address: [address], [place], [place] [address] Phone: [phone] Service Offered: Emergency Food Pantry.  Call for appointment.  Agency Name: [facility] Address: [address] [place], [place] [address] Phone: [phone] Website: [email or web address] Services Offered: Games developer. Call for details  Agency Name: [facility] Address: [address] [place], [place] Phone: [phone] Website: [email or web address] Service(s) Offered: Emergency Food Pantry. Call for details.  Agency Name: Holiday representative Address: [address], [place], [place] [address] Phone: [phone] or [phone] Website: [email or web address] Service(s) Offered: Distribute food 9am-11:30 am, Tuesday-Friday, and  1-3:30pm, Monday-Friday. Food pantry Monday-Friday 1pm-3pm, fresh items, Mon.-Wed.-Fri.  Agency Name: [facility] Address: [address] [place], [place] [address] Phone: [phone] Website: [email or web address] Services Offered: Distribute food Tues and Sats from 9:00am-noon. Closed 1st Saturday of each month. Call for details  Agency Name: [employer] Address: Fayrene Boatman [facility] [address] [place] [address] Phone: [phone]  Services Offered: Delivers meals every Thursday   Rent/Utility/Housing  Agency Name: [facility] Agency Address: [address] [place], [place] [address] Phone: [phone] Email: [email or web address] Website: [email or web address] Service(s) Offered: Housing services, self-sufficiency, congregate meal program, weatherization program, Field seismologist program, emergency food assistance,  housing counseling, home ownership program, wheels -towork program.  Agency Name: [facility] Address: [address], [place], [place] [address] Phone: [phone] (8a-4p) [phone] (8p- 10p) Email: [email or web address] Website: [email or web address] Service(s) Offered: A program for homeless and/or needy men  that includes one-on-one counseling, life skills training and job rehabilitation.  Agency Name: [facility] [place] Address: [address] [place], [place] [address] Phone: [phone] Website: www.[email or web address] Service(s) Offered: Assistance to needy in emergency with utility bills, heating fuel, and prescriptions. Shelter for homeless 7pm-7am. December 29, 2016 15  Agency Name: [facility] [place] (Developmentally Disabled) Address: [address], [place], [place] [address] Phone: [phone] Contact Person: Lemond Cart Email: [email or web address] Website: [email or web address] Service(s) Offered: Helps individuals with developmental disabilities move from housing that is more restrictive to homes where they  can achieve greater independence and have more  opportunities.  Agency Name: [facility] Address: [address], [place], [place] [address] Phone: [phone] Email: [email or web address] Website: [email or web address].org Service(s) Offered: Provides affordable housing for low-income families, elderly, and disabled individuals. Offer a wide range of  programs and services, from financial planning to afterschool and summer programs.  Agency Name: Department of Social Services Address: [address] [place], [place] [address] Phone: [phone] Service(s) Offered: Child support services; child welfare services; food stamps; Medicaid; work first family assistance; and aid with fuel,  rent, food and medicine.  Agency Name: [facility] [place] [employer]. Address: [facility] [address], [place], [place]  [address] Phone: [phone] Website: [email or web address].familyabuseservices.org Service(s) Offered: 24 hour Crisis Line: [phone]; 24 hour Emergency Shelter; Transitional Housing; Support Groups; Scientist, physiological; [employer]; Hispanic Outreach: [phone];  Visitation Center: [phone].  Agency Name:  [facility] [place]. Address: [address] [place], [place] [address] Phone: [phone] Service(s) Offered: CAP Services; Home and [employer]; Individual or Group Supports; Respite Care Non-Institutional Nursing;  Residential Supports; Respite Care and Personal Care Services; Transportation; Family and Friends Night; Recreational Activities; Three Nutritious Meals/Snacks; Consultation with Registered Dietician; Twenty-four hour Registered Nurse Access; Daily and [employer]; Camp Green Leaves; [place] for the [employer] (During Summer Months) Bingo Night (Every  Wednesday Night); Special Populations Dance Night  (Every Tuesday Night); Professional Hair Care Services.  Agency Name: [facility] Address: [address], [place], [place] [address] Phone: [phone] Contact Person: Meade High Website: [email or web address].Physicist, medical) Offered: Residential treatment facility for women; food and  clothing, educational & employment development and  transportation to work; Counsellor of financial skills;  parenting and family reunification; emotional and spiritual  support; transitional housing for program graduates.  Agency Name: [facility] Address: [address] [place], [place] [address] Phone: [phone] Email: [email or web address] Website: [email or web address].es Service(s) Offered: Public housing units for elderly, disabled, and low income people; housing  choice vouchers for income eligible  applicants; shelter plus care vouchers; and HOPWA voucher program.  Agency Name: [facility] [place] Address: [address] [place], [place] [address] Phone: [phone] Email: [email or web address] Website: www.habitatalamance.org Service(s) Offered: Build houses for families in need of decent housing. Each adult in the family must invest 200 hours of labor on  someone  else's house, work with volunteers to build their own house, attend classes on budgeting, home maintenance, yard care, and attend homeowner association meetings.  Agency Name: [facility] Address: [address], [place], [place] [address] Phone: [phone] Website: www.rsli.org Service(s) Offered: Intermediate care facilities for intellectually delayed, Supervised Living in group homes for adults with developmental disabilities, Supervised Living for people who have dual diagnoses (MRMI), Independent Living, Supported Living, respite and a variety of CAP services, pre-vocational services, day supports, and [employer].  Agency Name: [place] [employer] [facility] Phone: [phone] Website: www.NCForeclosurePrevention.gov Service(s) Offered: Zero-interest, deferred loans to homeowners struggling to pay their mortgage. Call for more information.     Transportation Resources for [employer]  Agency Name: [facility] Agency Address: [address] [place], [place] [address] Phone: [phone] Email: [email or web address] Website: www.[email or web address].org Service(s) Offered: Housing services, self-sufficiency, congregate meal program, weatherization program, Field seismologist program, emergency food assistance,  housing counseling, home ownership program, wheels-towork program.  Agency Name: [facility] [phone]) Address: [address] [place], [place] [address] Phone: [phone] Website: [email or web address].acta-[email or web address].[email or web address] Service(s) Offered: Transportation for [employer], subscription and demand response; Dial-a-Ride for citizens 21 years of age or older.  Agency Name: Department of Social Services Address: [address] [place], [place] [address] Phone: [phone] Service(s) Offered: Child support services; child welfare services; food stamps; Medicaid; work first  family assistance; and aid with fuel,  rent, food and medicine, transportation assistance.  Agency Name: Disabled [employer] (DAV) Transportation  Network Phone: [phone] Service(s) Offered: Transports veterans to the [facility] medical center. Call  forty-eight hours in advance and leave the name, telephone  number, date, and time of appointment. Veteran will be  contacted by the driver the day before the appointment to  arrange a pick up point    [employer] ACTA currently provides door to door services. ACTA connects with PART daily for services to [facility]. ACTA also performs contract services to [employer] operates 27 vehicles, all but 3 mini-vans are equipped with lifts for special needs as well as the general public. ACTA drivers are each CDL certified and trained in First Aid and CPR. ACTA was established in 2002 by [employer]. An independent Industrial/product designer. ACTA operates via Cytogeneticist with required local 10% match funding from [place]. ACTA provides over 80,000 passenger trips each year, including [facility] and [employer] sites.  Call at least by 11 AM one business day prior to needing transportation  [employer].                      [place], [place] [address]     Office Hours: Monday-Friday  8 AM - 5 PM

## 2024-06-19 NOTE — Progress Notes (Addendum)
 Progress Note   Patient: Derrick Blanchard FMW:[id number] DOB: [birth year] DOA: 06/16/2024     3 DOS: the patient was seen and examined on 06/19/2024   Brief hospital course: Derrick Blanchard is a 43 y.o. male with medical history significant for alcoholism, untreated hepatitis C, cirrhosis, hypertension, history of osteomyelitis s/p left leg amputation, who presents to the ER with complaints of right upper quadrant abdominal pain associated with watery diarrhea and stool incontinence for the past 2 days.  Admits to chills.    On presentation stable vital, labs with chronic thrombocytopenia, T. bili at 2.6 with INR of 1.5, ethanol at 186, normal ammonia and lipase. CT abdomen and pelvis consistent with cirrhosis of liver, portal hypertension and splenomegaly.  Extensive esophageal varices and cholelithiasis without complicating factors. RUQ ultrasound with cholelithiasis and mild gallbladder wall thickening which could be related to contracted state, liver disease or chronic cholecystitis.   Assessment and Plan: Cholecystitis Imaging with some concern of cholelithiasis but no definitive sign of infection. HIDA scan negative for cholecystitis and patent cystic duct.  Surgery is recommending continuation of antibiotics and he is not a surgical candidate by Surgery/ IR. Slowly improving symptoms - Continue with Unasyn  - Continue with supportive care, Advanced diet to soft  Norovirus infection - Patient with some concern of diarrhea and was not taking lactulose  - C.diff negative, GIP shows Norovirus - Supportive care   Liver cirrhosis, alcoholic (HCC) Mild transaminitis with underlying liver cirrhosis. -Continue to monitor, continue lactulose    Severe alcohol use disorder (HCC) Counseling was provided. - Continue with CIWA protocol  Pancytopenia Chronic idiopathic thrombocytopenia (HCC) - Likely secondary to liver cirrhosis and alcohol abuse.  Worsening platelets and now  leukopenia at 1.9, platelets of 28 today - Smear with normal morphology - FA, B12 wnl - Seen by Hematology, appreciate recs. Likely due to acute phase of hospitalization - Flow cytometry pending - Follow up outpatient, if pancytopenia fails to improve in 1 month, will consider bone marrow biopsy outpatient -Platelet transfusion might be needed if continue to decrease and less than 20  Hypokalemia Hypomagnesemia - Monitor and replete as needed   Hepatitis C History of untreated hepatitis C, likely contributed to liver cirrhosis. -Outpatient follow-up with infectious disease  Subjective: States RUQ pain improving, tolerating diet, advanced to soft Diarrhea improving  Physical Exam: Vitals:   06/18/24 1004 06/18/24 1541 06/18/24 1935 06/19/24 0423  BP: 112/64 100/64 (!) 109/56 (!) 105/56  Pulse: 75 73 75 67  Resp:  18 18 18   Temp:  98.3 F (36.8 C) 98.4 F (36.9 C) 97.8 F (36.6 C)  TempSrc:      SpO2:  98% 97% 100%  Weight:      Height:       General.  no acute distress. Pulmonary.  Lungs clear bilaterally, normal respiratory effort. CV.  Regular rate and rhythm, no JVD, rub or murmur. Abdomen.  Soft, mild RUQ tenderness, nondistended, BS positive. CNS.  Alert and oriented .  No focal neurologic deficit. Extremities.  No edema,  pulses intact and symmetrical. Psychiatry.  Judgment and insight appears normal.   Data Reviewed: Prior data reviewed  Family Communication: Discussed with patient  Disposition: Status is: Inpatient Remains inpatient appropriate because: Severity of illness  Planned Discharge Destination: Home  Time spent: 50 minutes  This record has been created using Conservation officer, historic buildings. Errors have been sought and corrected,but may not always be located. Such creation errors do not reflect on the standard of  care.   Author: Laree Lock, MD 06/19/2024 8:37 AM  For on call review [email or web address].

## 2024-06-19 NOTE — Plan of Care (Signed)
   Problem: Education: Goal: Knowledge of General Education information will improve Description Including pain rating scale, medication(s)/side effects and non-pharmacologic comfort measures Outcome: Progressing

## 2024-06-20 DIAGNOSIS — K819 Cholecystitis, unspecified: Secondary | ICD-10-CM | POA: Diagnosis not present

## 2024-06-20 LAB — CBC
HCT: 32.1 % — ABNORMAL LOW (ref 39.0–52.0)
Hemoglobin: 10.9 g/dL — ABNORMAL LOW (ref 13.0–17.0)
MCH: 29 pg (ref 26.0–34.0)
MCHC: 34 g/dL (ref 30.0–36.0)
MCV: 85.4 fL (ref 80.0–100.0)
Platelets: 35 K/uL — ABNORMAL LOW (ref 150–400)
RBC: 3.76 MIL/uL — ABNORMAL LOW (ref 4.22–5.81)
RDW: 14.7 % (ref 11.5–15.5)
WBC: 2.6 K/uL — ABNORMAL LOW (ref 4.0–10.5)
nRBC: 0 % (ref 0.0–0.2)

## 2024-06-20 LAB — BASIC METABOLIC PANEL WITH GFR
Anion gap: 7 (ref 5–15)
BUN: 6 mg/dL (ref 6–20)
CO2: 24 mmol/L (ref 22–32)
Calcium: 8.4 mg/dL — ABNORMAL LOW (ref 8.9–10.3)
Chloride: 110 mmol/L (ref 98–111)
Creatinine, Ser: 0.44 mg/dL — ABNORMAL LOW (ref 0.61–1.24)
GFR, Estimated: >60 mL/min (ref >60)
Glucose, Bld: 130 mg/dL — ABNORMAL HIGH (ref 70–99)
Potassium: 3.9 mmol/L (ref 3.5–5.1)
Sodium: 141 mmol/L (ref 135–145)

## 2024-06-20 LAB — MAGNESIUM: Magnesium: 2 mg/dL (ref 1.7–2.4)

## 2024-06-20 NOTE — Progress Notes (Signed)
 Progress Note   Patient: Derrick Blanchard FMW:[id number] DOB: 1981-03-18 DOA: 06/16/2024     4 DOS: the patient was seen and examined on 06/20/2024   Brief hospital course: Derrick Blanchard is a 43 y.o. male with medical history significant for alcoholism, untreated hepatitis C, cirrhosis, hypertension, history of osteomyelitis s/p left leg amputation, who presents to the ER with complaints of right upper quadrant abdominal pain associated with watery diarrhea and stool incontinence for the past 2 days.  Admits to chills.    On presentation stable vital, labs with chronic thrombocytopenia, T. bili at 2.6 with INR of 1.5, ethanol at 186, normal ammonia and lipase. CT abdomen and pelvis consistent with cirrhosis of liver, portal hypertension and splenomegaly.  Extensive esophageal varices and cholelithiasis without complicating factors. RUQ ultrasound with cholelithiasis and mild gallbladder wall thickening which could be related to contracted state, liver disease or chronic cholecystitis.   Assessment and Plan: Cholecystitis Imaging with some concern of cholelithiasis but no definitive sign of infection. HIDA scan negative for cholecystitis and patent cystic duct.  Surgery is recommending continuation of antibiotics and he is not a surgical candidate by Surgery/ IR. Slowly improving symptoms with antibiotics with no intervention - Continue with Unasyn  - Continue with supportive care, tolerating regular diet  Norovirus infection - Patient with some concern of diarrhea and was not taking lactulose  - C.diff negative, GIP shows Norovirus - Supportive care   Liver cirrhosis, alcoholic (HCC) Mild transaminitis with underlying liver cirrhosis. -Continue to monitor, continue lactulose    Severe alcohol use disorder (HCC) Counseling was provided. - Continue with CIWA protocol  Pancytopenia Chronic idiopathic thrombocytopenia (HCC) - Likely secondary to liver cirrhosis and alcohol  abuse.  Slowly improving platelets 35k, wbc 2.6k - Smear with normal morphology - FA, B12 wnl - Seen by Hematology, appreciate recs. Likely due to acute phase of hospitalization - Flow cytometry pending - Follow up outpatient, if pancytopenia fails to improve in 1 month, will consider bone marrow biopsy outpatient - Platelet transfusion might be needed if continue to decrease and less than 20  Hypokalemia Hypomagnesemia - Monitor and replete as needed   Hepatitis C History of untreated hepatitis C, likely contributed to liver cirrhosis. -Outpatient follow-up with infectious disease   Subjective: States RUQ pain improving, tolerating regular diet with no nausea/vomiting/abdominal pain Anticipate discharge tomorrow if pain continues to improve and diarrhea improving TOC consulted for discharge disposition, states does not have a place to live, was living with a friend but unsure if he can go back  Physical Exam: Vitals:   06/19/24 2000 06/20/24 0508 06/20/24 0845 06/20/24 1557  BP: (!) 103/57 (!) 110/57 117/71 105/62  Pulse: 73 66 74 73  Resp: 18 18 17 16   Temp: 98.1 F (36.7 C) 98.2 F (36.8 C) 97.7 F (36.5 C) 98.1 F (36.7 C)  TempSrc:  Oral    SpO2: 97% 98% 98% 97%  Weight:      Height:       General.  no acute distress. Pulmonary.  Lungs clear bilaterally, normal respiratory effort. CV.  Regular rate and rhythm, no JVD, rub or murmur. Abdomen.  Soft, mild RUQ tenderness, nondistended, BS positive. CNS.  Alert and oriented .  No focal neurologic deficit. Extremities.  No edema,  pulses intact and symmetrical. Psychiatry.  Judgment and insight appears normal.   Data Reviewed: Prior data reviewed  Family Communication: Discussed with patient  Disposition: Status is: Inpatient Remains inpatient appropriate because: Severity of illness  Planned  Discharge Destination: Home  Time spent: 50 minutes  This record has been created using Manufacturing engineer. Errors have been sought and corrected,but may not always be located. Such creation errors do not reflect on the standard of care.   Author: Laree Lock, MD 06/20/2024 4:25 PM  For on call review [email or web address].

## 2024-06-20 NOTE — Plan of Care (Signed)

## 2024-06-21 ENCOUNTER — Other Ambulatory Visit: Payer: Self-pay

## 2024-06-21 DIAGNOSIS — K819 Cholecystitis, unspecified: Secondary | ICD-10-CM | POA: Diagnosis not present

## 2024-06-21 LAB — BASIC METABOLIC PANEL WITH GFR
Anion gap: 3 — ABNORMAL LOW (ref 5–15)
BUN: 9 mg/dL (ref 6–20)
CO2: 26 mmol/L (ref 22–32)
Calcium: 8.6 mg/dL — ABNORMAL LOW (ref 8.9–10.3)
Chloride: 110 mmol/L (ref 98–111)
Creatinine, Ser: 0.47 mg/dL — ABNORMAL LOW (ref 0.61–1.24)
GFR, Estimated: >60 mL/min (ref >60)
Glucose, Bld: 113 mg/dL — ABNORMAL HIGH (ref 70–99)
Potassium: 4.2 mmol/L (ref 3.5–5.1)
Sodium: 139 mmol/L (ref 135–145)

## 2024-06-21 LAB — CBC
HCT: 36.5 % — ABNORMAL LOW (ref 39.0–52.0)
Hemoglobin: 11.8 g/dL — ABNORMAL LOW (ref 13.0–17.0)
MCH: 28.9 pg (ref 26.0–34.0)
MCHC: 32.3 g/dL (ref 30.0–36.0)
MCV: 89.5 fL (ref 80.0–100.0)
Platelets: 47 K/uL — ABNORMAL LOW (ref 150–400)
RBC: 4.08 MIL/uL — ABNORMAL LOW (ref 4.22–5.81)
RDW: 15.8 % — ABNORMAL HIGH (ref 11.5–15.5)
WBC: 3.7 K/uL — ABNORMAL LOW (ref 4.0–10.5)
nRBC: 0 % (ref 0.0–0.2)

## 2024-06-21 LAB — MAGNESIUM: Magnesium: 1.8 mg/dL (ref 1.7–2.4)

## 2024-06-21 NOTE — Progress Notes (Signed)
 Progress Note   Patient: Derrick Blanchard FMW:[id number] DOB: 1981/02/06 DOA: 06/16/2024     5 DOS: the patient was seen and examined on 06/21/2024   Brief hospital course: Derrick Blanchard is a 43 y.o. male with medical history significant for alcoholism, untreated hepatitis C, cirrhosis, hypertension, history of osteomyelitis s/p left leg amputation, who presents to the ER with complaints of right upper quadrant abdominal pain associated with watery diarrhea and stool incontinence for the past 2 days.  Admits to chills.    On presentation stable vital, labs with chronic thrombocytopenia, T. bili at 2.6 with INR of 1.5, ethanol at 186, normal ammonia and lipase. CT abdomen and pelvis consistent with cirrhosis of liver, portal hypertension and splenomegaly.  Extensive esophageal varices and cholelithiasis without complicating factors. RUQ ultrasound with cholelithiasis and mild gallbladder wall thickening which could be related to contracted state, liver disease or chronic cholecystitis.  RUQ pain Cholecystitis ruled out Imaging with some concern of cholelithiasis but no definitive sign of infection. HIDA scan negative for cholecystitis and patent cystic duct.  Surgery is recommending continuation of antibiotics and he is not a surgical candidate by Surgery/ IR. Slowly improving symptoms with antibiotics with no intervention - Continue with Unasyn , transition to po at discharge to complete 7 days - Continue with supportive care, tolerating regular diet  Norovirus infection - Patient with some concern of diarrhea and was not taking lactulose  - C.diff negative, GIP shows Norovirus - Supportive care   Liver cirrhosis, alcoholic (HCC) Mild transaminitis with underlying liver cirrhosis. -Continue to monitor, continue lactulose    Severe alcohol use disorder (HCC) Counseling was provided. - Continue with CIWA protocol  Pancytopenia Chronic idiopathic thrombocytopenia (HCC) -  Likely secondary to liver cirrhosis and alcohol abuse.  Slowly improving platelets 47k, wbc 3.7k - Smear with normal morphology - FA, B12 wnl - Seen by Hematology, appreciate recs. Likely due to acute phase of hospitalization - Flow cytometry pending - Follow up outpatient, if pancytopenia fails to improve in 1 month, will consider bone marrow biopsy outpatient  Hypokalemia Hypomagnesemia - Monitor and replete as needed   Hepatitis C History of untreated hepatitis C, likely contributed to liver cirrhosis. -Outpatient follow-up with infectious disease   Subjective: States RUQ pain improving, tolerating regular diet with no nausea/vomiting/abdominal pain Anticipate discharge tomorrow if pain continues to improve and diarrhea improving TOC consulted for discharge disposition, states he can go back to his friend's place tomorrow  Physical Exam: Vitals:   06/21/24 0601 06/21/24 0843 06/21/24 1252 06/21/24 1702  BP: 114/77 (!) 101/59 106/68 115/68  Pulse: 77 80 79 73  Resp: 18 17 17 16   Temp: 98 F (36.7 C) 98.2 F (36.8 C) 98.2 F (36.8 C) 98 F (36.7 C)  TempSrc:   Oral   SpO2: 99% 96% 100% 97%  Weight:      Height:       General.  no acute distress. Pulmonary.  Lungs clear bilaterally, normal respiratory effort. CV.  Regular rate and rhythm, no JVD, rub or murmur. Abdomen.  Soft, mild RUQ tenderness, nondistended, BS positive. CNS.  Alert and oriented .  No focal neurologic deficit. Extremities.  No edema,  pulses intact and symmetrical. Psychiatry.  Judgment and insight appears normal.   Data Reviewed: Prior data reviewed  Family Communication: Discussed with patient  Disposition: Status is: Inpatient Remains inpatient appropriate because: Severity of illness  Planned Discharge Destination: Home  Time spent: 35 minutes  This record has been created using [employer]  voice recognition software. Errors have been sought and corrected,but may not always be located. Such  creation errors do not reflect on the standard of care.   Author: Laree Lock, MD 06/21/2024 5:31 PM  For on call review [email or web address].

## 2024-06-21 NOTE — Plan of Care (Signed)

## 2024-06-21 NOTE — Plan of Care (Signed)
  Problem: Education: Goal: Knowledge of General Education information will improve Description: Including pain rating scale, medication(s)/side effects and non-pharmacologic comfort measures 06/21/2024 0454 by Candia Morita, RN Outcome: Progressing 06/21/2024 0452 by Candia Morita, RN Outcome: Progressing   Problem: Health Behavior/Discharge Planning: Goal: Ability to manage health-related needs will improve 06/21/2024 0454 by Candia Morita, RN Outcome: Progressing 06/21/2024 0452 by Candia Morita, RN Outcome: Progressing   Problem: Clinical Measurements: Goal: Ability to maintain clinical measurements within normal limits will improve 06/21/2024 0454 by Candia Morita, RN Outcome: Progressing 06/21/2024 0452 by Candia Morita, RN Outcome: Progressing Goal: Will remain free from infection 06/21/2024 0454 by Candia Morita, RN Outcome: Progressing 06/21/2024 0452 by Candia Morita, RN Outcome: Progressing Goal: Diagnostic test results will improve 06/21/2024 0454 by Candia Morita, RN Outcome: Progressing 06/21/2024 0452 by Candia Morita, RN Outcome: Progressing Goal: Respiratory complications will improve 06/21/2024 0454 by Candia Morita, RN Outcome: Progressing 06/21/2024 0452 by Candia Morita, RN Outcome: Progressing Goal: Cardiovascular complication will be avoided 06/21/2024 0454 by Candia Morita, RN Outcome: Progressing 06/21/2024 0452 by Candia Morita, RN Outcome: Progressing   Problem: Activity: Goal: Risk for activity intolerance will decrease 06/21/2024 0454 by Candia Morita, RN Outcome: Progressing 06/21/2024 0452 by Candia Morita, RN Outcome: Progressing   Problem: Nutrition: Goal: Adequate nutrition will be maintained 06/21/2024 0454 by Candia Morita, RN Outcome: Progressing 06/21/2024 0452 by Candia Morita, RN Outcome: Progressing   Problem: Coping: Goal: Level of anxiety will decrease 06/21/2024 0454 by Candia Morita, RN Outcome: Progressing 06/21/2024 0452 by Candia Morita, RN Outcome: Progressing   Problem: Elimination: Goal: Will not experience complications related to bowel motility 06/21/2024 0454 by Candia Morita, RN Outcome: Progressing 06/21/2024 0452 by Candia Morita, RN Outcome: Progressing Goal: Will not experience complications related to urinary retention 06/21/2024 0454 by Candia Morita, RN Outcome: Progressing 06/21/2024 0452 by Candia Morita, RN Outcome: Progressing   Problem: Pain Managment: Goal: General experience of comfort will improve and/or be controlled 06/21/2024 0454 by Candia Morita, RN Outcome: Progressing 06/21/2024 0452 by Candia Morita, RN Outcome: Progressing   Problem: Safety: Goal: Ability to remain free from injury will improve 06/21/2024 0454 by Candia Morita, RN Outcome: Progressing 06/21/2024 0452 by Candia Morita, RN Outcome: Progressing   Problem: Skin Integrity: Goal: Risk for impaired skin integrity will decrease 06/21/2024 0454 by Candia Morita, RN Outcome: Progressing 06/21/2024 0452 by Candia Morita, RN Outcome: Progressing

## 2024-06-21 NOTE — TOC Initial Note (Signed)
 Transition of Care [facility]) - Initial/Assessment Note    Patient Details  Name: Derrick Blanchard MRN: [id number] Date of Birth: 01/15/1981  [facility] CM/SW Contact:    Lauraine JAYSON Carpen, LCSW Phone Number: 06/21/2024, 4:59 PM  Clinical Narrative: Readmission prevention screen complete. CSW met with patient. No family at bedside. CSW introduced role and explained that discharge planning would be discussed. Patient missed his new PCP appointment while being hospitalized. CSW encouraged him to call them and reschedule. Pharmacy is [facility] in [place]. Ability to pay for medications depends on the time of the month. Patient is staying with a friend and thinks he can return there at discharge. CSW gave him [facility] List in case he needs the shelter in the future. No home health prior to admission. He has a wheelchair and is requesting a shower chair. CSW asked MD to enter DME order. No further concerns. CSW will continue to follow patient for support and facilitate return home once stable. He will ask one of his friends to pick him up at discharge.                Expected Discharge Plan: Home/Self Care Barriers to Discharge: Continued Medical Work up   Patient Goals and CMS Choice            Expected Discharge Plan and Services     Post Acute Care Choice: Durable Medical Equipment Living arrangements for the past 2 months: Single Family Home                                      Prior Living Arrangements/Services Living arrangements for the past 2 months: Single Family Home Lives with:: Friends Patient language and need for interpreter reviewed:: Yes Do you feel safe going back to the place where you live?: Yes      Need for Family Participation in Patient Care: Yes (Comment) Care giver support system in place?: Yes (comment) Current home services: DME Criminal Activity/Legal Involvement Pertinent to Current Situation/Hospitalization: No -  Comment as needed  Activities of Daily Living   ADL Screening (condition at time of admission) Independently performs ADLs?: Yes (appropriate for developmental age) Is the patient deaf or have difficulty hearing?: No Does the patient have difficulty seeing, even when wearing glasses/contacts?: No Does the patient have difficulty concentrating, remembering, or making decisions?: No  Permission Sought/Granted                  Emotional Assessment Appearance:: Appears stated age Attitude/Demeanor/Rapport: Engaged, Gracious Affect (typically observed): Accepting, Appropriate, Calm, Pleasant Orientation: : Oriented to Self, Oriented to Place, Oriented to  Time, Oriented to Situation Alcohol / Substance Use: Not Applicable Psych Involvement: No (comment)  Admission diagnosis:  Cholecystitis [K81.9] Patient Active Problem List   Diagnosis Date Noted   Chronic idiopathic thrombocytopenia (HCC) 06/17/2024   Cholecystitis 06/16/2024   Acute blood loss anemia 02/13/2024   Obesity (BMI 30-39.9) 02/13/2024   Lactic acidosis 02/12/2024   Hypernatremia 02/12/2024   Wound infection 02/11/2024   Generalized weakness 01/16/2024   Wound dehiscence 01/15/2024   Left leg pain 01/15/2024   Complication of foot amputation stump (HCC) 01/06/2024   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) 01/03/2024   Infection of toe as complication of amputation (HCC) 12/27/2023   Wound of left foot 12/13/2023   Open wound of left lower extremity 12/13/2023  Severe alcohol use disorder (HCC) 12/13/2023   Osteomyelitis (HCC) 12/13/2023   Hypomagnesemia 03/13/2023   Diarrhea 03/12/2023   Severe sepsis (HCC) 03/11/2023   Liver cirrhosis, alcoholic (HCC) 03/11/2023   Hepatitis C 03/11/2023   Cellulitis of left lower extremity 03/11/2023   Ulcer of left foot with muscle involvement without evidence of necrosis (HCC) 03/11/2023   Nausea and vomiting 03/11/2023   Portal hypertension with  esophageal varices (HCC) 11/27/2019   Thrombocytopenia, acquired 11/27/2019   GERD (gastroesophageal reflux disease) 11/24/2017   Tachycardia 11/24/2017   Hypokalemia 10/25/2017   Thrombocytopenia 10/25/2017   Advance care planning 10/25/2017   Depression, major, single episode, moderate (HCC) 10/25/2017   Elevated liver enzymes 09/28/2017   Hyperbilirubinemia    Alcohol use disorder, severe, dependence (HCC) 02/03/2015   Alcoholic hepatitis (HCC) 01/30/2015   Hematochezia 01/30/2015   Coagulopathy 01/30/2015   Hyponatremia 01/30/2015   PCP:  Patient, No Pcp Per Pharmacy:   [facility] [address] [place] [address] Phone: [phone] Fax: [phone]  [facility] [address] ([address]), [place] - [address] [place] ([place]) [place] [address] Phone: [phone] Fax: [phone]  [facility] - [place], [place] - [address] [place] [address] Phone: [phone] Fax: [phone]     Social Drivers of Health (SDOH) Social History: SDOH Screenings   Food Insecurity: Food Insecurity Present (06/17/2024)  Housing: High Risk (06/17/2024)  Transportation Needs: Unmet Transportation Needs (06/17/2024)  Utilities: Not At Risk (06/17/2024)  Financial Resource Strain: Medium Risk (03/28/2024)   Received from [facility]  Social Connections: Unknown (01/15/2024)  Tobacco Use: High Risk (06/16/2024)   SDOH Interventions:     Readmission Risk Interventions    06/21/2024    4:57 PM  Readmission Risk Prevention Plan  Transportation Screening Complete  PCP or Specialist appointment within 3-5 days of discharge Complete  SW Recovery Care/Counseling Consult Complete  Palliative Care Screening Not Applicable  Skilled Nursing Facility Not Applicable

## 2024-06-21 NOTE — Plan of Care (Signed)

## 2024-06-21 NOTE — Progress Notes (Signed)
 1444 Per pt he thought he lost his black bag with wallet, ID cards and credit cards inside, but was found and friend has bag prior to being admitted to hospital. Pt has cellphone at bedside.

## 2024-06-22 ENCOUNTER — Other Ambulatory Visit: Payer: Self-pay

## 2024-06-22 DIAGNOSIS — K819 Cholecystitis, unspecified: Secondary | ICD-10-CM | POA: Diagnosis not present

## 2024-06-22 LAB — CBC
HCT: 35 % — ABNORMAL LOW (ref 39.0–52.0)
Hemoglobin: 11.9 g/dL — ABNORMAL LOW (ref 13.0–17.0)
MCH: 29.4 pg (ref 26.0–34.0)
MCHC: 34 g/dL (ref 30.0–36.0)
MCV: 86.4 fL (ref 80.0–100.0)
Platelets: 47 K/uL — ABNORMAL LOW (ref 150–400)
RBC: 4.05 MIL/uL — ABNORMAL LOW (ref 4.22–5.81)
RDW: 16.2 % — ABNORMAL HIGH (ref 11.5–15.5)
WBC: 4 K/uL (ref 4.0–10.5)
nRBC: 0 % (ref 0.0–0.2)

## 2024-06-22 LAB — BASIC METABOLIC PANEL WITH GFR
Anion gap: 5 (ref 5–15)
BUN: 9 mg/dL (ref 6–20)
CO2: 25 mmol/L (ref 22–32)
Calcium: 8.8 mg/dL — ABNORMAL LOW (ref 8.9–10.3)
Chloride: 110 mmol/L (ref 98–111)
Creatinine, Ser: 0.58 mg/dL — ABNORMAL LOW (ref 0.61–1.24)
GFR, Estimated: >60 mL/min (ref >60)
Glucose, Bld: 111 mg/dL — ABNORMAL HIGH (ref 70–99)
Potassium: 3.7 mmol/L (ref 3.5–5.1)
Sodium: 140 mmol/L (ref 135–145)

## 2024-06-22 LAB — MAGNESIUM: Magnesium: 1.8 mg/dL (ref 1.7–2.4)

## 2024-06-22 MED ORDER — ACETAMINOPHEN 500 MG PO TABS
500.0000 mg | ORAL_TABLET | Freq: Four times a day (QID) | ORAL | 0 refills | Status: AC | PRN
  Filled 2024-06-22: qty 20, 5d supply, fill #0

## 2024-06-22 MED ORDER — OXYCODONE HCL 5 MG PO TABS
5.0000 mg | ORAL_TABLET | ORAL | 0 refills | Status: AC | PRN
  Filled 2024-06-22: qty 8, 2d supply, fill #0

## 2024-06-22 MED ORDER — CIPROFLOXACIN HCL 500 MG PO TABS
500.0000 mg | ORAL_TABLET | Freq: Two times a day (BID) | ORAL | 0 refills | Status: AC
  Filled 2024-06-22: qty 4, 2d supply, fill #0

## 2024-06-22 MED ORDER — METRONIDAZOLE 250 MG PO TABS
375.0000 mg | ORAL_TABLET | Freq: Two times a day (BID) | ORAL | 0 refills | Status: DC
  Filled 2024-06-22: qty 4, 2d supply, fill #0

## 2024-06-22 MED ORDER — METRONIDAZOLE 500 MG PO TABS
500.0000 mg | ORAL_TABLET | Freq: Two times a day (BID) | ORAL | 0 refills | Status: AC
  Filled 2024-06-22: qty 4, 2d supply, fill #0

## 2024-06-22 NOTE — Discharge Planning (Signed)
 IV removed Patient was handed the bag of sealed medications from [facility] pharmacy Discharge instruction given to patient, emphasized the importance of following up with PCP after getting established with one of the providers for the list printed.  PNR given for pain (see chart) Tiffanie R. RN able to find patients some clothes.  Patient wants DME delivered to address in Monday, CM was made aware Michelle Lining)  Patient is waiting go on a ride from friend that will be coming by soon All concerns addressed, no other needs at this time

## 2024-06-22 NOTE — Plan of Care (Signed)

## 2024-06-22 NOTE — Discharge Summary (Signed)
 Physician Discharge Summary   Patient: Derrick Blanchard MRN: [id number] DOB: [birth year]  Admit date:     06/16/2024  Discharge date: 06/22/24  Discharge Physician: Laree Lock   PCP: Patient, No Pcp Per   Recommendations at discharge:   Follow up with PCP within 1 week - Repeat BMP, Mag, Potassium Referral provided  Follow up with Hematology for pancytopenia  Discharge Diagnoses: Principal Problem:   Cholecystitis Active Problems:   Diarrhea   Liver cirrhosis, alcoholic (HCC)   Severe alcohol use disorder (HCC)   Chronic idiopathic thrombocytopenia (HCC)   Hepatitis C  Hospital Course: Derrick Blanchard is a 43 y.o. male with medical history significant for alcoholism, untreated hepatitis C, cirrhosis, hypertension, history of osteomyelitis s/p left leg amputation, who presents to the ER with complaints of right upper quadrant abdominal pain associated with watery diarrhea and stool incontinence for the past 2 days.  Admits to chills. Admitted for RUQ pain and diarrhea due to Norovirus  RUQ pain - resolved Cholecystitis ruled out Imaging with some concern of cholelithiasis but no definitive sign of infection. HIDA scan negative for cholecystitis and patent cystic duct.  Surgery is recommending continuation of antibiotics and he is not a surgical candidate by Surgery/ IR. Slowly improving symptoms with antibiotics with no intervention - Was on Unasyn , discharge on cipro, Flagyl  to complete 7 days - Continue with supportive care, tolerating regular diet   Norovirus infection - Patient with some concern of diarrhea and was not taking lactulose , improved - C.diff negative, GIP shows Norovirus - Supportive care   Liver cirrhosis, alcoholic Mild transaminitis with underlying liver cirrhosis. - continue lactulose    Severe alcohol use disorder Counseling was provided   Pancytopenia Chronic idiopathic thrombocytopenia - Likely secondary to liver cirrhosis and  alcohol abuse.  Slowly improving platelets 47k, wbc 4.0 - Smear with normal morphology - FA, B12 wnl - Seen by Hematology, appreciate recs. Likely due to acute phase of hospitalization - Flow cytometry pending - Follow up outpatient, if pancytopenia fails to improve in 1 month, will consider bone marrow biopsy outpatient   Hypokalemia Hypomagnesemia - Repleted, diarrhea improved   Hepatitis C History of untreated hepatitis C, likely contributed to liver cirrhosis. - Follow up outpatient   -- Discharged, states will go to friend's place.    Consultants: General Surgery, Hematology Procedures performed: None  Disposition: [facility] home Diet recommendation:  Discharge Diet Orders (From admission, onward)     Start     Ordered   06/22/24 0000  Diet - low sodium heart healthy        06/22/24 1216           DISCHARGE MEDICATION: Allergies as of 06/22/2024   No Known Allergies      Medication List     TAKE these medications    acetaminophen  500 MG tablet Commonly known as: TYLENOL  Take 1 tablet (500 mg total) by mouth every 6 (six) hours as needed for mild pain (pain score 1-3), fever or headache.   carvedilol 3.125 MG tablet Commonly known as: COREG Take 3.125 mg by mouth.   ciprofloxacin 500 MG tablet Commonly known as: Cipro Take 1 tablet (500 mg total) by mouth 2 (two) times daily for 2 days.   ferrous sulfate 75 (15 Fe) MG/ML Soln Commonly known as: FER-IN-SOL Take 325 mg by mouth.   folic acid  1 MG tablet Commonly known as: FOLVITE  Take 1 tablet (1 mg total) by mouth daily.   lactulose  10 GM/15ML solution  Commonly known as: CHRONULAC  Take 30 mLs (20 g total) by mouth daily.   metronidazole  375 MG capsule Commonly known as: Flagyl  Take 1 capsule (375 mg total) by mouth 2 (two) times daily for 2 days.   multivitamin with minerals Tabs tablet Take 1 tablet by mouth daily.   omeprazole  20 MG capsule Commonly known as: PRILOSEC Take 1 capsule  (20 mg total) by mouth 2 (two) times daily before a meal.   oxyCODONE  5 MG immediate release tablet Commonly known as: Oxy IR/ROXICODONE  Take 1 tablet (5 mg total) by mouth every 4 (four) hours as needed for severe pain (pain score 7-10).   polyethylene glycol 17 g packet Commonly known as: MIRALAX  / GLYCOLAX  Take 17 g by mouth daily.   pregabalin  200 MG capsule Commonly known as: LYRICA  Take 1 capsule (200 mg total) by mouth 2 (two) times daily.   Sofosbuvir-Velpatasvir 400-100 MG Tabs Take by mouth.   thiamine  100 MG tablet Commonly known as: VITAMIN B1 Take 1 tablet (100 mg total) by mouth daily.        Discharge Exam: Filed Weights   06/16/24 1736  Weight: 91.6 kg   General.  no acute distress. Pulmonary.  Lungs clear bilaterally, normal respiratory effort. CV.  Regular rate and rhythm, no JVD, rub or murmur. Abdomen.  Soft, non tender, nondistended, BS positive. CNS.  Alert and oriented .  No focal neurologic deficit. Extremities.  No edema,  pulses intact and symmetrical.  Condition at discharge: good  The results of significant diagnostics from this hospitalization (including imaging, microbiology, ancillary and laboratory) are listed below for reference.   Imaging Studies: NM Hepatobiliary Liver Func Result Date: 06/17/2024 CLINICAL DATA:  RUQ abdominal pain, biliary disease suspected, US  nondiagnostic Cholelithiasis and gallbladder wall thickening on ultrasound. EXAM: NUCLEAR MEDICINE HEPATOBILIARY IMAGING TECHNIQUE: Sequential images of the abdomen were obtained out to 60 minutes following intravenous administration of radiopharmaceutical. RADIOPHARMACEUTICALS:  8.14 mCi Tc-45m  Choletec  IV COMPARISON:  Abdominopelvic CT and right upper quadrant abdominal ultrasound 06/16/2024. FINDINGS: Prompt uptake and biliary excretion of activity by the liver is seen. Biller activity passes into small bowel, consistent with patency of the common bile duct. There is delayed  filling of the gallbladder lumen. No evidence of bile leak. IMPRESSION: Delayed gallbladder filling consistent with patency of the cystic duct. No evidence of cholecystitis. Electronically Signed   By: Elsie Perone M.D.   On: 06/17/2024 20:41   US  ABDOMEN LIMITED RUQ (LIVER/GB) Result Date: 06/16/2024 CLINICAL DATA:  Right upper quadrant pain EXAM: ULTRASOUND ABDOMEN LIMITED RIGHT UPPER QUADRANT COMPARISON:  CT today FINDINGS: Gallbladder: Gallstones as seen on CT. Gallbladder appears contracted. Mild gallbladder wall thickening at 5 mm. Negative sonographic Murphy sign. Common bile duct: Diameter: Normal caliber, 3 mm. Liver: Mildly increased echotexture throughout the liver. Nodular contours compatible with cirrhosis. No focal hepatic abnormality. Portal vein is patent on color Doppler imaging with normal direction of blood flow towards the liver. Other: None. IMPRESSION: Cholelithiasis. Gallbladder is contracted with mild gallbladder wall thickening. This could be related to the contracted state, liver disease or chronic cholecystitis. Fatty liver, changes of cirrhosis. Electronically Signed   By: Franky Crease M.D.   On: 06/16/2024 20:55   CT ABDOMEN PELVIS W CONTRAST Result Date: 06/16/2024 CLINICAL DATA:  Right-sided abdominal pain for 2 days EXAM: CT ABDOMEN AND PELVIS WITH CONTRAST TECHNIQUE: Multidetector CT imaging of the abdomen and pelvis was performed using the standard protocol following bolus administration of intravenous contrast. RADIATION  DOSE REDUCTION: This exam was performed according to the departmental dose-optimization program which includes automated exposure control, adjustment of the mA and/or kV according to patient size and/or use of iterative reconstruction technique. CONTRAST:  OMNIPAQUE  IOHEXOL  300 MG/ML  SOLN COMPARISON:  03/10/2023 FINDINGS: Lower chest: No acute abnormality. Hepatobiliary: Liver is well visualized with diffuse nodularity consistent with underlying  cirrhosis. 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 This spontaneously decompresses into the right common femoral vein and to a lesser degree in the left common femoral vein. These changes are stable from the prior exam. Gallbladder is well distended with dependent gallstones. Pancreas: Unremarkable. No pancreatic ductal dilatation or surrounding inflammatory changes. Spleen: Mild splenomegaly. Adrenals/Urinary Tract: Adrenal glands are within normal limits. Kidneys demonstrate a normal enhancement pattern bilaterally. No renal calculi are seen. No obstructive changes are noted. The bladder is well distended. Stomach/Bowel: The appendix is within normal limits. No obstructive or inflammatory changes of the colon are seen. Stomach and small bowel are within normal limits. Extensive esophageal varices are noted. Vascular/Lymphatic: Aortic atherosclerosis. No enlarged abdominal or pelvic lymph nodes. Reproductive: Prostate is unremarkable. Other: No abdominal wall hernia or abnormality. No abdominopelvic ascites. Musculoskeletal: No acute or significant osseous findings. IMPRESSION: Changes consistent with cirrhosis of the liver with portal hypertension and recanalization of the umbilical vein. Associated splenomegaly is noted as well. Extensive esophageal varices. Cholelithiasis without complicating factors. No acute abnormality noted. Electronically Signed   By: Oneil Devonshire M.D.   On: 06/16/2024 19:10    Microbiology: Results for orders placed or performed during the hospital encounter of 06/16/24  Resp panel by RT-PCR (RSV, Flu A&B, Covid) Anterior Nasal Swab     Status: None   Collection Time: 06/16/24  6:20 PM   Specimen: Anterior Nasal Swab  Result Value Ref Range Status   SARS Coronavirus 2 by RT PCR NEGATIVE NEGATIVE Final    Comment: (NOTE) SARS-CoV-2 target nucleic acids are NOT DETECTED.  The SARS-CoV-2 RNA is generally detectable in upper  respiratory specimens during the acute phase of infection. The lowest concentration of SARS-CoV-2 viral copies this assay can detect is 138 copies/mL. A negative result does not preclude SARS-Cov-2 infection and should not be used as the sole basis for treatment or other patient management decisions. A negative result may occur with  improper specimen collection/handling, submission of specimen other than nasopharyngeal swab, presence of viral mutation(s) within the areas targeted by this assay, and inadequate number of viral copies(<138 copies/mL). A negative result must be combined with clinical observations, patient history, and epidemiological information. The expected result is Negative.  Fact Sheet for Patients:  BloggerCourse.com  Fact Sheet for Healthcare Providers:  SeriousBroker.it  This test is no t yet approved or cleared by the [place]  FDA and  has been authorized for detection and/or diagnosis of SARS-CoV-2 by FDA under an Emergency Use Authorization (EUA). This EUA will remain  in effect (meaning this test can be used) for the duration of the COVID-19 declaration under Section 564(b)(1) of the Act, 21 U.S.C.section 360bbb-3(b)(1), unless the authorization is terminated  or revoked sooner.       Influenza A by PCR NEGATIVE NEGATIVE Final   Influenza B by PCR NEGATIVE NEGATIVE Final    Comment: (NOTE) The Xpert Xpress SARS-CoV-2/FLU/RSV plus assay is intended as an aid in the diagnosis of influenza from Nasopharyngeal swab specimens and should not be used as a sole basis for treatment. Nasal washings and aspirates  are unacceptable for Xpert Xpress SARS-CoV-2/FLU/RSV testing.  Fact Sheet for Patients: BloggerCourse.com  Fact Sheet for Healthcare Providers: SeriousBroker.it  This test is not yet approved or cleared by the [place]  FDA and has been  authorized for detection and/or diagnosis of SARS-CoV-2 by FDA under an Emergency Use Authorization (EUA). This EUA will remain in effect (meaning this test can be used) for the duration of the COVID-19 declaration under Section 564(b)(1) of the Act, 21 U.S.C. section 360bbb-3(b)(1), unless the authorization is terminated or revoked.     Resp Syncytial Virus by PCR NEGATIVE NEGATIVE Final    Comment: (NOTE) Fact Sheet for Patients: BloggerCourse.com  Fact Sheet for Healthcare Providers: SeriousBroker.it  This test is not yet approved or cleared by the [place]  FDA and has been authorized for detection and/or diagnosis of SARS-CoV-2 by FDA under an Emergency Use Authorization (EUA). This EUA will remain in effect (meaning this test can be used) for the duration of the COVID-19 declaration under Section 564(b)(1) of the Act, 21 U.S.C. section 360bbb-3(b)(1), unless the authorization is terminated or revoked.  Performed at [facility], [address] [place], [place] [address]   Gastrointestinal Panel by PCR , Stool     Status: Abnormal   Collection Time: 06/17/24  5:36 AM   Specimen: Stool  Result Value Ref Range Status   Campylobacter species NOT DETECTED NOT DETECTED Final   Plesimonas shigelloides NOT DETECTED NOT DETECTED Final   Salmonella species NOT DETECTED NOT DETECTED Final   Yersinia enterocolitica NOT DETECTED NOT DETECTED Final   Vibrio species NOT DETECTED NOT DETECTED Final   Vibrio cholerae NOT DETECTED NOT DETECTED Final   Enteroaggregative E coli (EAEC) NOT DETECTED NOT DETECTED Final   Enteropathogenic E coli (EPEC) NOT DETECTED NOT DETECTED Final   Enterotoxigenic E coli (ETEC) NOT DETECTED NOT DETECTED Final   Shiga like toxin producing E coli (STEC) NOT DETECTED NOT DETECTED Final   Shigella/Enteroinvasive E coli (EIEC) NOT DETECTED NOT DETECTED Final   Cryptosporidium NOT DETECTED NOT  DETECTED Final   Cyclospora cayetanensis NOT DETECTED NOT DETECTED Final   Entamoeba histolytica NOT DETECTED NOT DETECTED Final   Giardia lamblia NOT DETECTED NOT DETECTED Final   Adenovirus F40/41 NOT DETECTED NOT DETECTED Final   Astrovirus NOT DETECTED NOT DETECTED Final   Norovirus GI/GII DETECTED (A) NOT DETECTED Final    Comment: RESULT CALLED TO, READ BACK BY AND VERIFIED WITH: STEPHANIE HAMLIN 1328 06/19/24 MU    Rotavirus A NOT DETECTED NOT DETECTED Final   Sapovirus (I, II, IV, and V) NOT DETECTED NOT DETECTED Final    Comment: Performed at [facility], [address] [place], [place] [address]  C Difficile Quick Screen w PCR reflex     Status: None   Collection Time: 06/17/24 10:48 AM   Specimen: STOOL  Result Value Ref Range Status   C Diff antigen NEGATIVE NEGATIVE Final   C Diff toxin NEGATIVE NEGATIVE Final   C Diff interpretation No C. difficile detected.  Final    Comment: Performed at [facility], [address] [place], [place] [address]    Labs: CBC: Recent Labs  Lab 06/18/24 0940 06/19/24 0405 06/20/24 0440 06/21/24 0615 06/22/24 0544  WBC 1.8* 1.7* 2.6* 3.7* 4.0  NEUTROABS 1.2* 1.2*  --   --   --   HGB 11.3* 10.5* 10.9* 11.8* 11.9*  HCT 32.3* 30.3* 32.1* 36.5* 35.0*  MCV 82.8 84.2 85.4 89.5 86.4  PLT 29* 28*  35* 47* 47*   Basic Metabolic Panel: Recent Labs  Lab 06/17/24 0317 06/18/24 0616 06/19/24 0405 06/20/24 0440 06/21/24 0615 06/22/24 0544  NA 144 139 141 141 139 140  K 3.5 3.3* 3.2* 3.9 4.2 3.7  CL 113* 109 110 110 110 110  CO2 20* 23 25 24 26 25   GLUCOSE 91 106* 173* 130* 113* 111*  BUN 8 10 6 6 9 9   CREATININE 0.56* 0.52* 0.58* 0.44* 0.47* 0.58*  CALCIUM  8.9 8.5* 8.1* 8.4* 8.6* 8.8*  MG 1.8  --  1.3* 2.0 1.8 1.8  PHOS 3.1  --   --   --   --   --    Liver Function Tests: Recent Labs  Lab 06/16/24 1810 06/17/24 0317 06/18/24 0616 06/18/24 1240 06/19/24 0405  AST 52* 44* 39  --  36  ALT 23 18 17   --   15  ALKPHOS 159* 138* 108  --  95  BILITOT 2.6* 2.3* 3.5* 3.2* 1.9*  PROT 7.5 6.7 6.0*  --  5.4*  ALBUMIN 3.9 3.5 3.2*  --  2.9*   CBG: No results for input(s): GLUCAP in the last 168 hours.  Discharge time spent: greater than 30 minutes.  Signed: Laree Lock, MD [facility] 06/22/2024

## 2024-06-22 NOTE — Plan of Care (Signed)

## 2024-06-26 LAB — COMP PANEL: LEUKEMIA/LYMPHOMA

## 2024-08-14 ENCOUNTER — Telehealth: Payer: Self-pay

## 2024-08-14 NOTE — Telephone Encounter (Signed)
 Left VM for pt to return call to office to set up new pt appt for Derrick Blanchard
